# Patient Record
Sex: Male | Born: 1950 | Race: Black or African American | Hispanic: No | Smoking: Former smoker
Health system: Southern US, Community
[De-identification: ages and names within clinical notes are randomized; demographics above are authoritative.]

## PROBLEM LIST (undated history)

## (undated) DIAGNOSIS — D649 Anemia, unspecified: Secondary | ICD-10-CM

## (undated) HISTORY — PX: COLONOSCOPY: SHX174

---
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
## 2021-07-23 ENCOUNTER — Other Ambulatory Visit: Payer: Self-pay

## 2021-07-23 ENCOUNTER — Emergency Department (HOSPITAL_COMMUNITY): Payer: Medicare Other

## 2021-07-23 ENCOUNTER — Emergency Department (HOSPITAL_COMMUNITY)
Admission: EM | Admit: 2021-07-23 | Discharge: 2021-07-23 | Disposition: A | Discharge status: Home / Self Care | Payer: Medicare Other | Attending: Emergency Medicine | Admitting: Emergency Medicine

## 2021-07-23 ENCOUNTER — Encounter (HOSPITAL_COMMUNITY): Payer: Self-pay

## 2021-07-23 DIAGNOSIS — R195 Other fecal abnormalities: Secondary | ICD-10-CM | POA: Diagnosis present

## 2021-07-23 DIAGNOSIS — F17210 Nicotine dependence, cigarettes, uncomplicated: Secondary | ICD-10-CM | POA: Diagnosis not present

## 2021-07-23 DIAGNOSIS — R109 Unspecified abdominal pain: Secondary | ICD-10-CM | POA: Diagnosis not present

## 2021-07-23 DIAGNOSIS — K625 Hemorrhage of anus and rectum: Secondary | ICD-10-CM | POA: Insufficient documentation

## 2021-07-23 LAB — COMPREHENSIVE METABOLIC PANEL
ALT: 24 U/L (ref 0–44)
AST: 25 U/L (ref 15–41)
Albumin: 3.6 g/dL (ref 3.5–5.0)
Alkaline Phosphatase: 91 U/L (ref 38–126)
Anion gap: 8 (ref 5–15)
BUN: 15 mg/dL (ref 8–23)
CO2: 27 mmol/L (ref 22–32)
Calcium: 9.2 mg/dL (ref 8.9–10.3)
Chloride: 102 mmol/L (ref 98–111)
Creatinine, Ser: 0.74 mg/dL (ref 0.61–1.24)
GFR, Estimated: >60 mL/min (ref >60)
Glucose, Bld: 102 mg/dL — ABNORMAL HIGH (ref 70–99)
Potassium: 4.1 mmol/L (ref 3.5–5.1)
Sodium: 137 mmol/L (ref 135–145)
Total Bilirubin: 0.4 mg/dL (ref 0.3–1.2)
Total Protein: 8.1 g/dL (ref 6.5–8.1)

## 2021-07-23 LAB — CBC WITH DIFFERENTIAL/PLATELET
Abs Immature Granulocytes: 0.03 10*3/uL (ref 0.00–0.07)
Basophils Absolute: 0.1 10*3/uL (ref 0.0–0.1)
Basophils Relative: 1 %
Eosinophils Absolute: 0.2 10*3/uL (ref 0.0–0.5)
Eosinophils Relative: 2 %
HCT: 42.6 % (ref 39.0–52.0)
Hemoglobin: 13.4 g/dL (ref 13.0–17.0)
Immature Granulocytes: 0 %
Lymphocytes Relative: 19 %
Lymphs Abs: 1.6 10*3/uL (ref 0.7–4.0)
MCH: 28 pg (ref 26.0–34.0)
MCHC: 31.5 g/dL (ref 30.0–36.0)
MCV: 89.1 fL (ref 80.0–100.0)
Monocytes Absolute: 0.8 10*3/uL (ref 0.1–1.0)
Monocytes Relative: 10 %
Neutro Abs: 6 10*3/uL (ref 1.7–7.7)
Neutrophils Relative %: 68 %
Platelets: 450 10*3/uL — ABNORMAL HIGH (ref 150–400)
RBC: 4.78 MIL/uL (ref 4.22–5.81)
RDW: 14 % (ref 11.5–15.5)
WBC: 8.7 10*3/uL (ref 4.0–10.5)
nRBC: 0 % (ref 0.0–0.2)

## 2021-07-23 LAB — URINALYSIS, ROUTINE W REFLEX MICROSCOPIC
Bacteria, UA: NONE SEEN
Bilirubin Urine: NEGATIVE
Glucose, UA: NEGATIVE mg/dL
Ketones, ur: NEGATIVE mg/dL
Leukocytes,Ua: NEGATIVE
Nitrite: NEGATIVE
Protein, ur: NEGATIVE mg/dL
Specific Gravity, Urine: >1.03 — ABNORMAL HIGH (ref 1.005–1.030)
pH: 6 (ref 5.0–8.0)

## 2021-07-23 LAB — LIPASE, BLOOD: Lipase: 26 U/L (ref 11–51)

## 2021-07-23 IMAGING — CT CT ABD-PELV W/ CM
2 of 5 series · 16 of 46 positions shown, 18 images · IV contrast (OMNIPAQUE 350)
Comparison: None.

CLINICAL DATA: Left lower quadrant abdominal pain for 3 months.
Dark red blood in stool.

EXAM:
CT ABDOMEN AND PELVIS WITH CONTRAST
TECHNIQUE: Multidetector CT imaging of the abdomen and pelvis was performed
using the standard protocol following bolus administration of
intravenous contrast.
CONTRAST:  80mL OMNIPAQUE IOHEXOL 350 MG/ML SOLN

[Series 2: axial st · axial · 0.75mm/px · z∈[+1196,+1531]mm · 13 of 79 slices shown, 15 images]
[im 6/79  soft-tissue]
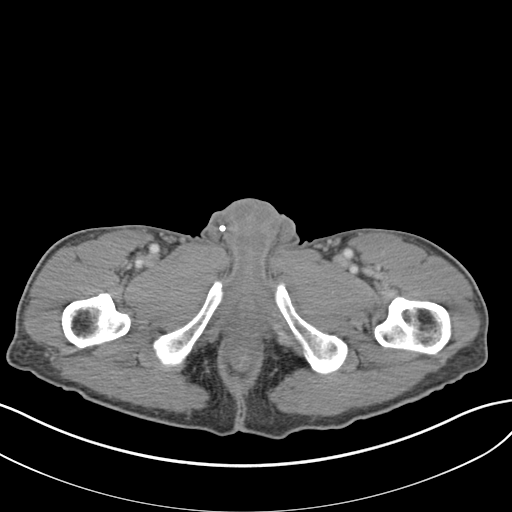
[im 6/79  bone]
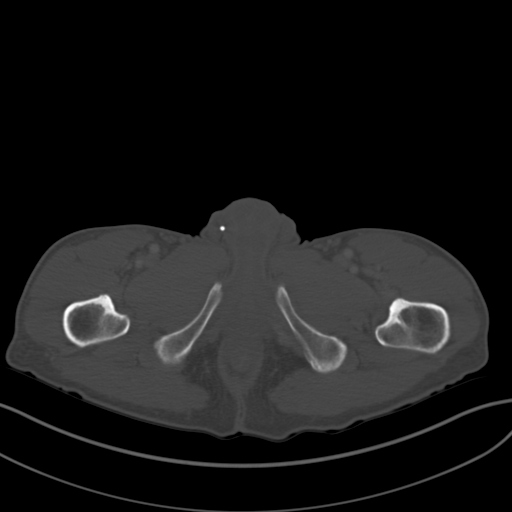
[im 12/79  soft-tissue]
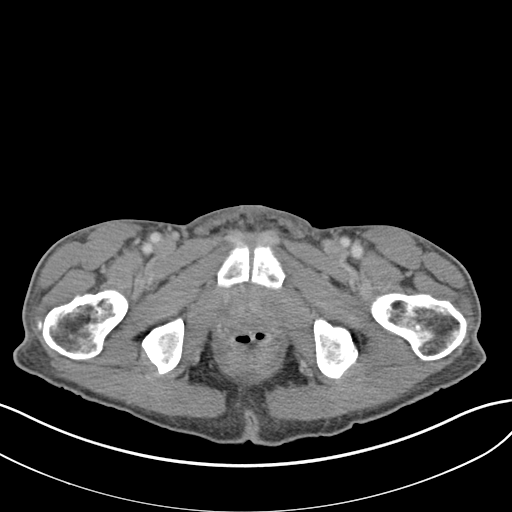
[im 17/79  soft-tissue]
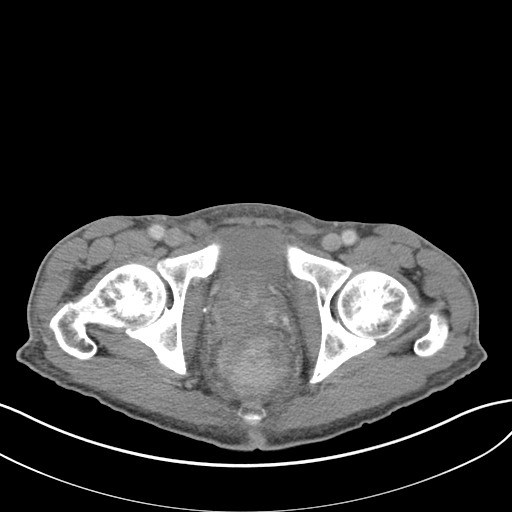
[im 23/79  soft-tissue]
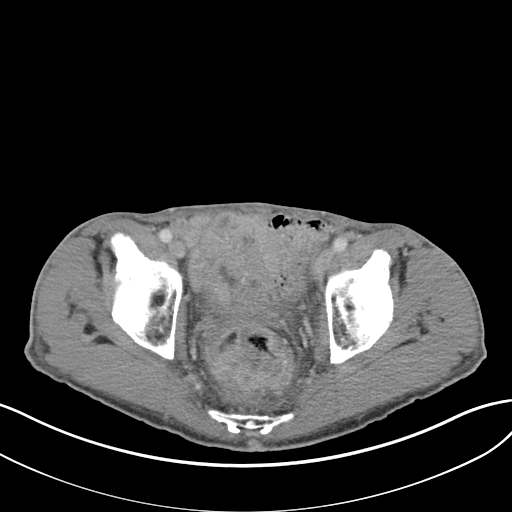
[im 28/79  soft-tissue]
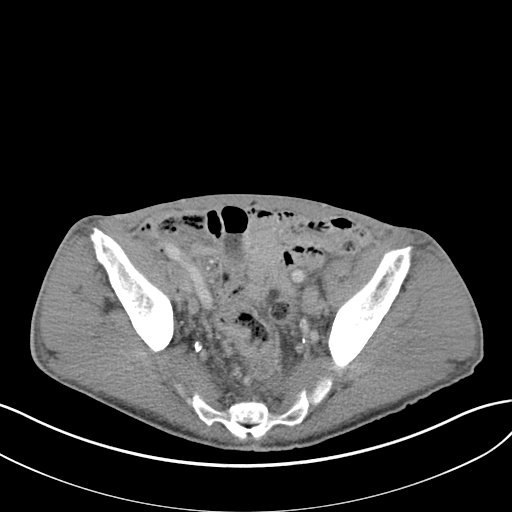
[im 34/79  soft-tissue]
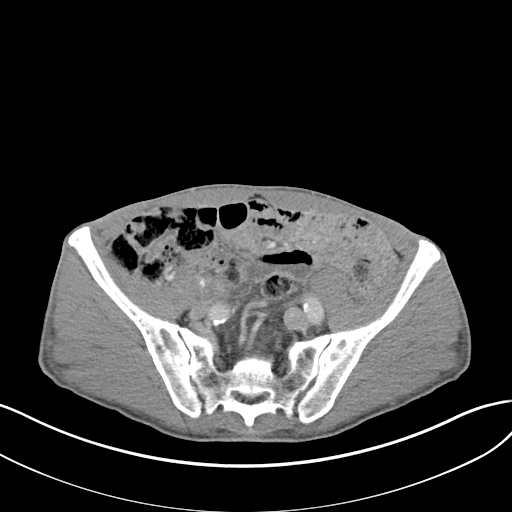
[im 40/79  soft-tissue]
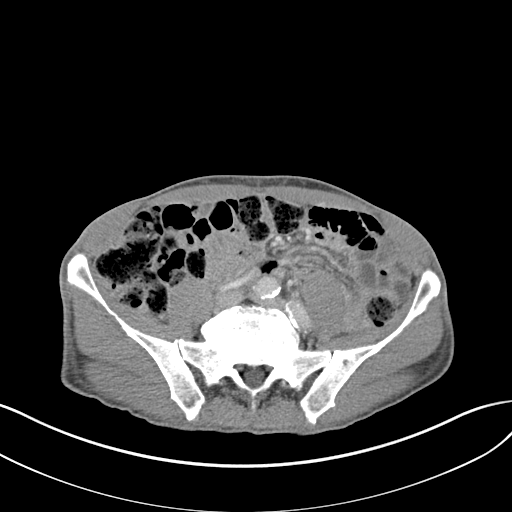
[im 45/79  soft-tissue]
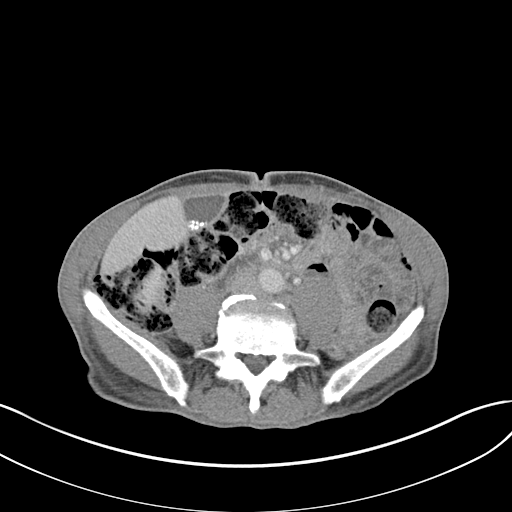
[im 51/79  soft-tissue]
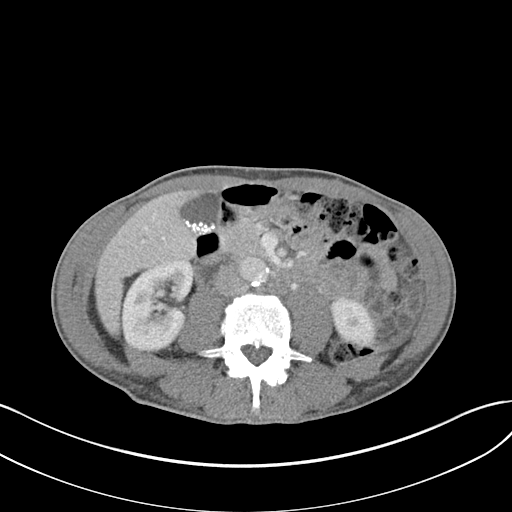
[im 51/79  bone]
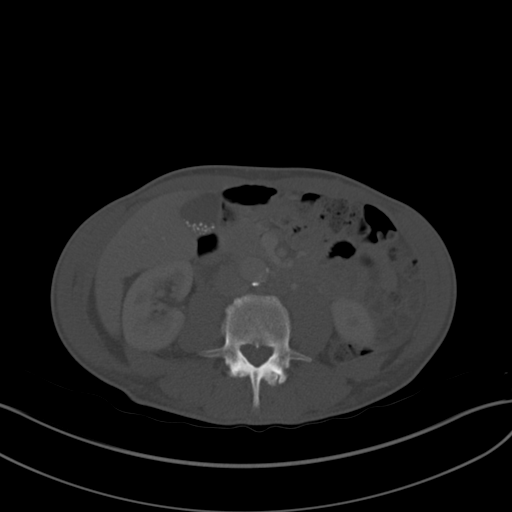
[im 56/79  soft-tissue]
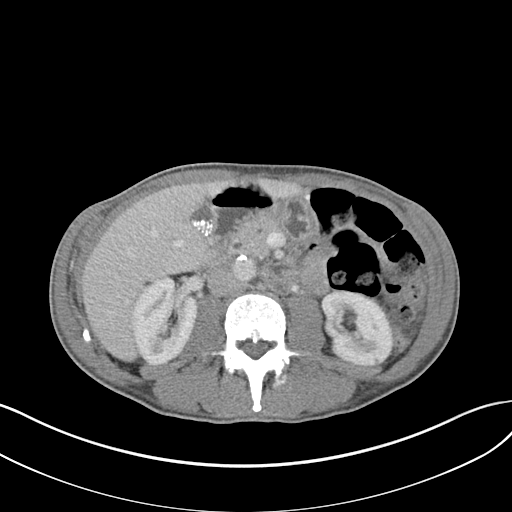
[im 62/79  soft-tissue]
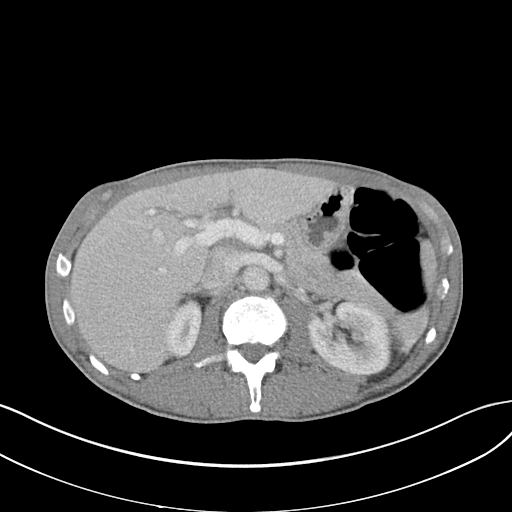
[im 67/79  soft-tissue]
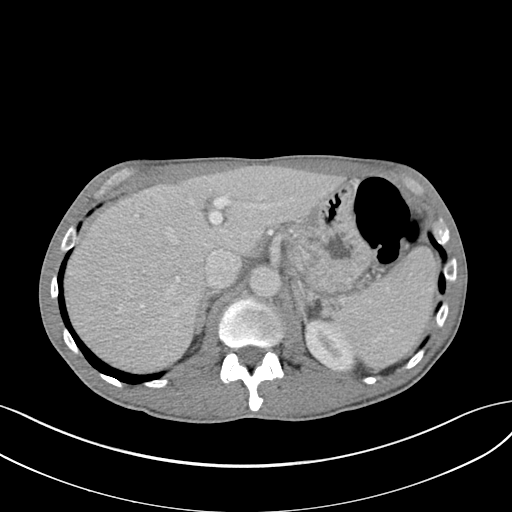
[im 73/79  soft-tissue]
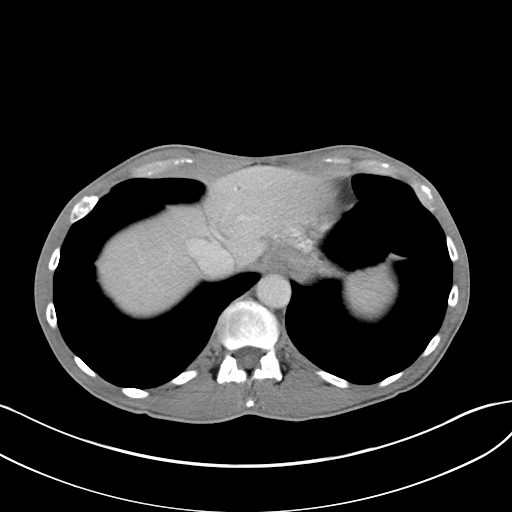

[Series 5: coronal st · coronal · 0.74mm/px · 3 of 121 slices shown]
[im 41/121  soft-tissue]
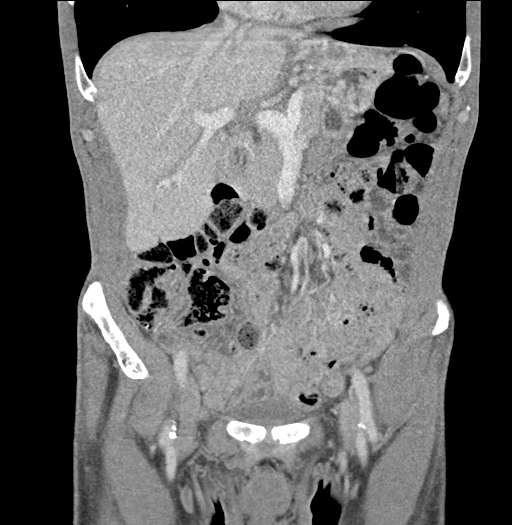
[im 54/121  soft-tissue]
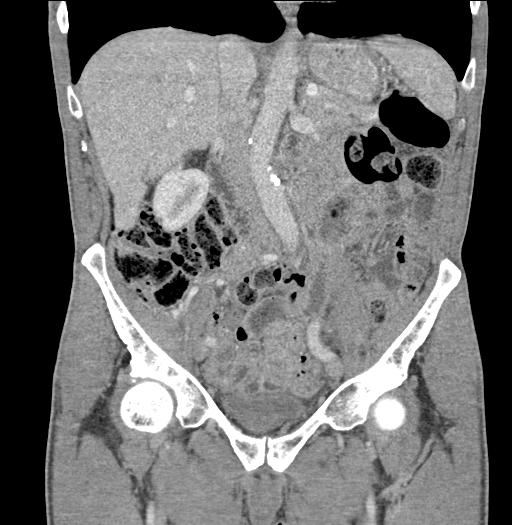
[im 67/121  soft-tissue]
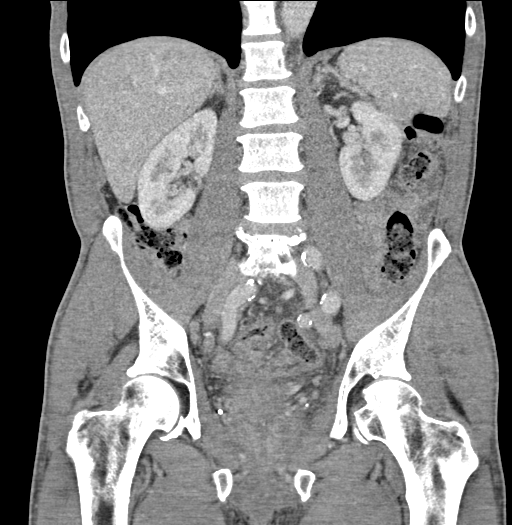

[16 of 46 positions shown; findings below may reference images not displayed]

FINDINGS: Lower chest: Lung bases are clear.

Hepatobiliary: No focal liver lesions. Cholelithiasis with multiple
small stones in the gallbladder. No inflammatory changes. No bile
duct dilatation.

Pancreas: Unremarkable. No pancreatic ductal dilatation or
surrounding inflammatory changes.

Spleen: Normal in size without focal abnormality.

Adrenals/Urinary Tract: Adrenal glands are unremarkable. Kidneys are
normal, without renal calculi, focal lesion, or hydronephrosis.
Bladder is unremarkable.

Stomach/Bowel: The stomach, small bowel, and colon are not
abnormally distended. Stool fills the colon. Lack of fat planes and
in the absence of oral contrast material, evaluation of bowel is
limited. However, the rectum appears to be expanded and
heterogeneous, more than would be expected with stool. Given the
symptoms, this is suspicious for a circumferential rectal mass,
likely neoplastic. There is infiltration into the perirectal fat
with suggestion of a small perirectal hypodense mass or collection.
Further evaluation of the colon is warranted. Consider colonoscopy
or proctoscopy for direct visualization of the area. Inflammatory
process would be less likely.

Vascular/Lymphatic: Scattered aortic calcification. No aneurysm. No
significant retroperitoneal lymphadenopathy.

Reproductive: Prostate gland is enlarged.

Other: No free air or free fluid in the abdomen. Abdominal wall
musculature appears intact.

Musculoskeletal: No acute or significant osseous findings.
IMPRESSION: 1. Appearance of the rectum likely indicates a rectal mass
suspicious for rectal carcinoma. Inflammatory process would be less
likely. Direct visualization is suggested.
2. Cholelithiasis without evidence of acute cholecystitis.
3. Aortic atherosclerosis.

## 2021-07-23 MED ORDER — IOHEXOL 350 MG/ML SOLN
80.0000 mL | Freq: Once | INTRAVENOUS | Status: AC | PRN
  Administered 2021-07-23: 80 mL via INTRAVENOUS

## 2021-07-23 NOTE — Progress Notes (Signed)
IV placed for patient's CT scan, explained to patient not to leave without advising the nurse staff, also explained to the patient's family

## 2021-07-23 NOTE — ED Provider Notes (Signed)
[facility] DEPT Provider Note   CSN: [id number] Arrival date & time: 07/23/21  1609     History Chief Complaint  Patient presents with   Blood In Stools   Abdominal Pain   Diarrhea    Jorge [name] is a 70 y.o. male.  Patient presents to ER chief complaint of bloody stools intermittent diarrhea.  Symptoms have been ongoing for approximately 5 weeks intermittently.  He went to urgent care today and was advised to go to the ER for further evaluation.  He denies any abdominal pain.  Denies any headache or chest pain.  No reports of fevers or cough.  He states he has intermittent back pain at times has been taking Motrin for several weeks now.      History reviewed. No pertinent past medical history.  There are no problems to display for this patient.   History reviewed. No pertinent surgical history.     History reviewed. No pertinent family history.  Social History   Tobacco Use   Smoking status: Every Day    Types: Cigarettes   Smokeless tobacco: Never  Substance Use Topics   Alcohol use: Not Currently   Drug use: Never    Home Medications Prior to Admission medications   Not on File    Allergies    Patient has no known allergies.  Review of Systems   Review of Systems  Constitutional:  Negative for fever.  HENT:  Negative for ear pain and sore throat.   Eyes:  Negative for pain.  Respiratory:  Negative for cough.   Cardiovascular:  Negative for chest pain.  Gastrointestinal:  Negative for abdominal pain.  Genitourinary:  Negative for flank pain.  Musculoskeletal:  Positive for gait problem. Negative for back pain.  Skin:  Negative for color change and rash.  Neurological:  Negative for syncope.  All other systems reviewed and are negative.  Physical Exam Updated Vital Signs BP (!) 157/83   Pulse 81   Temp 98.1 F (36.7 C) (Oral)   Resp 18   SpO2 99%   Physical Exam Constitutional:      Appearance: He is  well-developed.  HENT:     Head: Normocephalic.     Nose: Nose normal.  Eyes:     Extraocular Movements: Extraocular movements intact.  Cardiovascular:     Rate and Rhythm: Normal rate.  Pulmonary:     Effort: Pulmonary effort is normal.  Abdominal:     Tenderness: There is no abdominal tenderness. There is no guarding or rebound.  Skin:    Coloration: Skin is not jaundiced.  Neurological:     General: No focal deficit present.     Mental Status: He is alert and oriented to person, place, and time. Mental status is at baseline.     Cranial Nerves: No cranial nerve deficit.     Motor: No weakness.    ED Results / Procedures / Treatments   Labs (all labs ordered are listed, but only abnormal results are displayed) Labs Reviewed  CBC WITH DIFFERENTIAL/PLATELET - Abnormal; Notable for the following components:      Result Value   Platelets 450 (*)    All other components within normal limits  COMPREHENSIVE METABOLIC PANEL - Abnormal; Notable for the following components:   Glucose, Bld 102 (*)    All other components within normal limits  URINALYSIS, ROUTINE W REFLEX MICROSCOPIC - Abnormal; Notable for the following components:   Color, Urine YELLOW (*)  APPearance CLEAR (*)    Specific Gravity, Urine >1.030 (*)    Hgb urine dipstick TRACE (*)    Non Squamous Epithelial 0-5 (*)    All other components within normal limits  LIPASE, BLOOD  POC OCCULT BLOOD, ED    EKG None  Radiology CT ABDOMEN PELVIS W CONTRAST  Result Date: 07/23/2021 CLINICAL DATA:  Left lower quadrant abdominal pain for 3 months. Dark red blood in stool. EXAM: CT ABDOMEN AND PELVIS WITH CONTRAST TECHNIQUE: Multidetector CT imaging of the abdomen and pelvis was performed using the standard protocol following bolus administration of intravenous contrast. CONTRAST:  40m OMNIPAQUE IOHEXOL 350 MG/ML SOLN COMPARISON:  None. FINDINGS: Lower chest: Lung bases are clear. Hepatobiliary: No focal liver lesions.  Cholelithiasis with multiple small stones in the gallbladder. No inflammatory changes. No bile duct dilatation. Pancreas: Unremarkable. No pancreatic ductal dilatation or surrounding inflammatory changes. Spleen: Normal in size without focal abnormality. Adrenals/Urinary Tract: Adrenal glands are unremarkable. Kidneys are normal, without renal calculi, focal lesion, or hydronephrosis. Bladder is unremarkable. Stomach/Bowel: The stomach, small bowel, and colon are not abnormally distended. Stool fills the colon. Lack of fat planes and in the absence of oral contrast material, evaluation of bowel is limited. However, the rectum appears to be expanded and heterogeneous, more than would be expected with stool. Given the symptoms, this is suspicious for a circumferential rectal mass, likely neoplastic. There is infiltration into the perirectal fat with suggestion of a small perirectal hypodense mass or collection. Further evaluation of the colon is warranted. Consider colonoscopy or proctoscopy for direct visualization of the area. Inflammatory process would be less likely. Vascular/Lymphatic: Scattered aortic calcification. No aneurysm. No significant retroperitoneal lymphadenopathy. Reproductive: Prostate gland is enlarged. Other: No free air or free fluid in the abdomen. Abdominal wall musculature appears intact. Musculoskeletal: No acute or significant osseous findings. IMPRESSION: 1. Appearance of the rectum likely indicates a rectal mass suspicious for rectal carcinoma. Inflammatory process would be less likely. Direct visualization is suggested. 2. Cholelithiasis without evidence of acute cholecystitis. 3. Aortic atherosclerosis. Electronically Signed   By: WLucienne CapersM.D.   On: 07/23/2021 19:06    Procedures Procedures   Medications Ordered in ED Medications  iohexol (OMNIPAQUE) 350 MG/ML injection 80 mL (80 mLs Intravenous Contrast Given 07/23/21 1830)    ED Course  I have reviewed the triage  vital signs and the nursing notes.  Pertinent labs & imaging results that were available during my care of the patient were reviewed by me and considered in my medical decision making (see chart for details).    MDM Rules/Calculators/A&P                           Patient has a benign exam.  He has no abdominal tenderness no guarding or rebound.  Vital signs are within normal limits.  Hemoglobin appears normal and stable.  His symptoms appear ongoing for several weeks without significant lab abnormalities noted or vital sign abnormalities noted.  I feel he stable for outpatient work-up.  We will provide a phone number for gastroenterologist for patient to call make an appointment.  However advised immediate return if he has weakness trouble breathing shortness of breath fatigue or any additional concerns such as heavy bleeding.  Advised him to call the GI doctor otherwise this week.  Final Clinical Impression(s) / ED Diagnoses Final diagnoses:  Rectal bleeding    Rx / DC Orders ED Discharge Orders  None        Luna Fuse, MD 07/23/21 2102

## 2021-07-23 NOTE — ED Triage Notes (Signed)
Pt reports abdominal pain, dark red blood in his stool and diarrhea x 9 months. Pt was seen at [facility] and was told to come to the ED

## 2021-07-23 NOTE — ED Provider Notes (Signed)
Emergency Medicine Provider Triage Evaluation Note  Jorge [name] , a 70 y.o. male  was evaluated in triage.  Pt complains of blood in stool.  Review of Systems  Positive: Increase BMs, dark color stool, lower back pain, mild abd pain Negative: Fever, dysuria  Physical Exam  BP (!) 174/96 (BP Location: Left Arm)   Pulse 82   Temp 98.1 F (36.7 C) (Oral)   Resp 16   SpO2 100%  Gen:   Awake, no distress   Resp:  Normal effort  MSK:   Moves extremities without difficulty  Other:    Medical Decision Making  Medically screening exam initiated at 5:00 PM.  Appropriate orders placed.  Jorge [name] was informed that the remainder of the evaluation will be completed by another provider, this initial triage assessment does not replace that evaluation, and the importance of remaining in the ED until their evaluation is complete.  Pt with dark red blood in stool along with having loose stool for more than a month.  Does have back pain that he takes ibuprofen regularly.  Increase BM.  No n/v   Domenic Moras, PA-C 07/23/21 1702    Luna Fuse, MD 07/23/21 2059

## 2021-07-23 NOTE — Discharge Instructions (Addendum)
Call your primary care doctor or specialist as discussed in the next 2-3 days.   Return immediately back to the ER if:  Your symptoms worsen within the next 12-24 hours. You develop new symptoms such as new fevers, persistent vomiting, new pain, shortness of breath, or new weakness or numbness, or if you have any other concerns.  

## 2021-08-09 ENCOUNTER — Encounter: Payer: Self-pay | Admitting: Gastroenterology

## 2021-08-11 ENCOUNTER — Emergency Department (HOSPITAL_COMMUNITY)
Admission: EM | Admit: 2021-08-11 | Discharge: 2021-08-12 | Disposition: A | Discharge status: Home / Self Care | Payer: Medicare Other | Attending: Emergency Medicine | Admitting: Emergency Medicine

## 2021-08-11 ENCOUNTER — Other Ambulatory Visit: Payer: Self-pay

## 2021-08-11 ENCOUNTER — Encounter (HOSPITAL_COMMUNITY): Payer: Self-pay | Admitting: Emergency Medicine

## 2021-08-11 DIAGNOSIS — F17210 Nicotine dependence, cigarettes, uncomplicated: Secondary | ICD-10-CM | POA: Insufficient documentation

## 2021-08-11 DIAGNOSIS — K6289 Other specified diseases of anus and rectum: Secondary | ICD-10-CM | POA: Diagnosis not present

## 2021-08-11 DIAGNOSIS — K625 Hemorrhage of anus and rectum: Secondary | ICD-10-CM | POA: Diagnosis present

## 2021-08-11 LAB — CBC WITH DIFFERENTIAL/PLATELET
Abs Immature Granulocytes: 0.01 10*3/uL (ref 0.00–0.07)
Basophils Absolute: 0.1 10*3/uL (ref 0.0–0.1)
Basophils Relative: 1 %
Eosinophils Absolute: 0.2 10*3/uL (ref 0.0–0.5)
Eosinophils Relative: 3 %
HCT: 38 % — ABNORMAL LOW (ref 39.0–52.0)
Hemoglobin: 12.3 g/dL — ABNORMAL LOW (ref 13.0–17.0)
Immature Granulocytes: 0 %
Lymphocytes Relative: 29 %
Lymphs Abs: 2.2 10*3/uL (ref 0.7–4.0)
MCH: 28.6 pg (ref 26.0–34.0)
MCHC: 32.4 g/dL (ref 30.0–36.0)
MCV: 88.4 fL (ref 80.0–100.0)
Monocytes Absolute: 0.8 10*3/uL (ref 0.1–1.0)
Monocytes Relative: 11 %
Neutro Abs: 4.4 10*3/uL (ref 1.7–7.7)
Neutrophils Relative %: 56 %
Platelets: 397 10*3/uL (ref 150–400)
RBC: 4.3 MIL/uL (ref 4.22–5.81)
RDW: 14.6 % (ref 11.5–15.5)
WBC: 7.7 10*3/uL (ref 4.0–10.5)
nRBC: 0 % (ref 0.0–0.2)

## 2021-08-11 LAB — COMPREHENSIVE METABOLIC PANEL
ALT: 14 U/L (ref 0–44)
AST: 18 U/L (ref 15–41)
Albumin: 3.3 g/dL — ABNORMAL LOW (ref 3.5–5.0)
Alkaline Phosphatase: 88 U/L (ref 38–126)
Anion gap: 6 (ref 5–15)
BUN: 13 mg/dL (ref 8–23)
CO2: 25 mmol/L (ref 22–32)
Calcium: 9 mg/dL (ref 8.9–10.3)
Chloride: 107 mmol/L (ref 98–111)
Creatinine, Ser: 0.71 mg/dL (ref 0.61–1.24)
GFR, Estimated: >60 mL/min (ref >60)
Glucose, Bld: 98 mg/dL (ref 70–99)
Potassium: 3.9 mmol/L (ref 3.5–5.1)
Sodium: 138 mmol/L (ref 135–145)
Total Bilirubin: 0.7 mg/dL (ref 0.3–1.2)
Total Protein: 7.9 g/dL (ref 6.5–8.1)

## 2021-08-11 LAB — TYPE AND SCREEN
ABO/RH(D): O NEG
Antibody Screen: NEGATIVE

## 2021-08-11 LAB — PROTIME-INR
INR: 1.1 (ref 0.8–1.2)
Prothrombin Time: 14.5 seconds (ref 11.4–15.2)

## 2021-08-11 LAB — POC OCCULT BLOOD, ED: Fecal Occult Bld: POSITIVE — AB

## 2021-08-11 NOTE — ED Notes (Signed)
Per EMT first pt did not respond to role call

## 2021-08-11 NOTE — ED Triage Notes (Signed)
Pt c/o bloody stools and diarrhea x 2 weeks. Denies abdominal pain and fevers.

## 2021-08-11 NOTE — ED Provider Notes (Signed)
Emergency Medicine Provider Triage Evaluation Note  Jorge [name] , a 70 y.o. male  was evaluated in triage.  Pt complains of rectal bleeding.  Patient states his symptoms have been ongoing for the past 4 to 5 months.  No abdominal pain, fevers, syncope.  Does note intermittent lightheadedness, particularly when standing up too fast.  No rectal pain.  States his rectal bleeding occurs on a near daily basis.  States he used to take ibuprofen frequently but has discontinued ibuprofen use.  Denies any alcohol use.  Physical Exam  BP (!) 182/104 (BP Location: Left Arm)   Pulse 99   Temp 98 F (36.7 C) (Oral)   Resp 17   Ht 6' (1.829 m)   Wt 61.2 kg   SpO2 99%   BMI 18.31 kg/m  Gen:   Awake, no distress   Resp:  Normal effort  MSK:   Moves extremities without difficulty  Other:    Medical Decision Making  Medically screening exam initiated at 3:38 PM.  Appropriate orders placed.  Jorge [name] was informed that the remainder of the evaluation will be completed by another provider, this initial triage assessment does not replace that evaluation, and the importance of remaining in the ED until their evaluation is complete.   Rayna Sexton, PA-C 08/11/21 1539    Lacretia Leigh, MD 08/15/21 [phone]

## 2021-08-12 MED ORDER — HYDROCORTISONE (PERIANAL) 2.5 % EX CREA: 1.0000 "application " | TOPICAL_CREAM | Freq: Two times a day (BID) | CUTANEOUS | 0 refills | Status: DC

## 2021-08-12 NOTE — ED Provider Notes (Signed)
[place] DEPT Provider Note   CSN: [id number] Arrival date & time: 08/11/21  1524     History Chief Complaint  Patient presents with   Rectal Bleeding    Jorge [name] is a 70 y.o. male.  HPI Patient reports he has been having problems with rectal bleeding for several months.  He did get seen and had a CAT scan done in the emergency department [phone].  At that time concern for a rectal mass.  Patient reports he has his follow-up with gastroenterology scheduled but appointment is still a week away.  Patient reports that he is getting large amounts of rectal bleeding.  He reports some days he does not have any bleeding but then other days he has quite a bit.  He reports that he has rectal pain when he is sitting directly on the rectum.  He however is not having rectal pain when he is on the toilet or does not pressure directly to the rectum.  Reports he is also passing a lot of gas that he cannot seem to control.  No abdominal pain.  No lightheadedness no weakness no syncope or near syncope.  He reports the stool is usually semisolid and muddy in appearance.    History reviewed. No pertinent past medical history.  There are no problems to display for this patient.   History reviewed. No pertinent surgical history.     No family history on file.  Social History   Tobacco Use   Smoking status: Every Day    Types: Cigarettes   Smokeless tobacco: Never  Substance Use Topics   Alcohol use: Not Currently   Drug use: Never    Home Medications Prior to Admission medications   Medication Sig Start Date End Date Taking? Authorizing Provider  hydrocortisone (ANUSOL-HC) 2.5 % rectal cream Place 1 application rectally 2 (two) times daily. 08/12/21  Yes Charlesetta Shanks, MD    Allergies    Patient has no known allergies.  Review of Systems   Review of Systems 10 systems reviewed and negative except as per HPI Physical Exam Updated Vital Signs BP  (!) 148/91   Pulse 85   Temp 98 F (36.7 C) (Oral)   Resp 18   Ht 6' (1.829 m)   Wt 61.2 kg   SpO2 98%   BMI 18.31 kg/m   Physical Exam Constitutional:      Comments: Alert nontoxic clinically well in appearance.  HENT:     Head: Normocephalic and atraumatic.     Mouth/Throat:     Pharynx: Oropharynx is clear.  Eyes:     Extraocular Movements: Extraocular movements intact.  Cardiovascular:     Rate and Rhythm: Normal rate and regular rhythm.  Pulmonary:     Effort: Pulmonary effort is normal.     Breath sounds: Normal breath sounds.  Abdominal:     General: There is no distension.     Palpations: Abdomen is soft.     Tenderness: There is no abdominal tenderness. There is no guarding.  Genitourinary:    Comments: Normal external visual inspection of rectum.  No masses or hemorrhoids.  Digital exam is for diffuse tenderness of the anal canal.  No local mass.  Trace yellowish-green stool from the vault.  No melena, no cranberry colored or red blood. Musculoskeletal:        General: No swelling or tenderness. Normal range of motion.     Right lower leg: No edema.  Left lower leg: No edema.  Skin:    General: Skin is warm and dry.  Neurological:     General: No focal deficit present.     Mental Status: He is oriented to person, place, and time.     Motor: No weakness.     Coordination: Coordination normal.    ED Results / Procedures / Treatments   Labs (all labs ordered are listed, but only abnormal results are displayed) Labs Reviewed  CBC WITH DIFFERENTIAL/PLATELET - Abnormal; Notable for the following components:      Result Value   Hemoglobin 12.3 (*)    HCT 38.0 (*)    All other components within normal limits  COMPREHENSIVE METABOLIC PANEL - Abnormal; Notable for the following components:   Albumin 3.3 (*)    All other components within normal limits  POC OCCULT BLOOD, ED - Abnormal; Notable for the following components:   Fecal Occult Bld POSITIVE (*)     All other components within normal limits  PROTIME-INR  TYPE AND SCREEN  ABO/RH    EKG None  Radiology No results found.  Procedures Procedures   Medications Ordered in ED Medications - No data to display  ED Course  I have reviewed the triage vital signs and the nursing notes.  Pertinent labs & imaging results that were available during my care of the patient were reviewed by me and considered in my medical decision making (see chart for details).    MDM Rules/Calculators/A&P                           Patient presents for rectal bleeding.  CT scan done about 3 weeks ago showed circumferential thickening concerning for rectal mass neoplasm.  Patient is scheduled for his follow-up with gastroenterology in a week.  He is having sporadic bouts of rectal bleeding.  Hemoglobin is not showing significant change at this time.  Is greater than 12.  About 3 weeks ago was at 37.  Patient does not show any signs of being hypovolemic orthostatic.  Digital rectal exam does not show any melena or cramping or red stool.  Normal trace amount of yellowish-green stool.  At this time patient is counseled on maintaining his plan for close follow-up with GI and the concern for rectal neoplasm.  Reviewed return symptoms for anemia.  For some local symptomatic relief patient can try Anusol HC and sitz bath.  Careful return precautions reviewed. Final Clinical Impression(s) / ED Diagnoses Final diagnoses:  Rectal bleeding  Rectal mass    Rx / DC Orders ED Discharge Orders          Ordered    hydrocortisone (ANUSOL-HC) 2.5 % rectal cream  2 times daily        08/12/21 0009             Charlesetta Shanks, MD 08/12/21 7591

## 2021-08-12 NOTE — Discharge Instructions (Signed)
1.  CT scan identified a rectal mass.  You must go to your appointment with the gastroenterologist as scheduled. 2.  Until you are seen and further evaluate, it may be difficult to completely control or eliminate your symptoms.  At this time you are having some rectal bleeding but fortunately, your blood counts and vital signs are remaining stable.  If you start to develop symptoms of anemia such as shortness of breath with activity, feeling like you will pass out or other concerning symptoms, return to the emergency department for recheck. 3.  For some local symptomatic relief you may try Anusol as prescribed.  May also try sitz bath as described in your discharge instructions.

## 2021-08-18 ENCOUNTER — Telehealth: Payer: Self-pay | Admitting: Gastroenterology

## 2021-08-18 ENCOUNTER — Other Ambulatory Visit: Payer: Self-pay

## 2021-08-18 ENCOUNTER — Encounter: Payer: Self-pay | Admitting: *Deleted

## 2021-08-18 DIAGNOSIS — K625 Hemorrhage of anus and rectum: Secondary | ICD-10-CM

## 2021-08-18 NOTE — Telephone Encounter (Signed)
I spoke with the daughter and she has been advised and instructed for colon tomorrow 08/19/21 at 8 am. She has picked up the miralax and dulcolax.  I have entered the referral and sent the instructions to My Chart.  The daughter signed the pt up for my Chart while we were on the phone.

## 2021-08-18 NOTE — Telephone Encounter (Signed)
Jorge [name] 10-20-1951 [id number]  Procedure Date: 08/19/21  Arrival Time: 622 am Procedure Time:8 am  Procedure Location: [facility] (4th Floor)                                     [place], [place] [address]  [facility] open at 645 am   Check in at the receptionist's desk on the 4th floor before having a seat in the main lobby.                                                                               MIRALAX SPLIT DOSE INSTRUCTIONS THIS IS A SPLIT-DOSE PREP YOU WILL NOT BE DRINKING ALL THE PREP AT SAME TIME.   FOLLOW THESE INSTRUCTIONS ON THE DAY PRIOR TO AND THE MORNING OF THE EXAM.  Please purchase the following items over the counter: One box Dulcolax laxative (Bisacodyl) 5 mg tablets (you will need 4 tablets) One 238 gram bottle of Miralax (polyethylene glycol)  One 64 ounce bottle of Gatorade (NO Red, NO purple)    THE DAY BEFORE YOUR PROCEDURE Date: 08/18/21  TODAY   In the morning, mix the entire 238 gram bottle of Miralax with your 64 ounce room temperature Gatorade. Stir to dissolve and refrigerate.   Drink clear liquids the entire day - YOU SHOULD NOT EAT ANY SOLID FOOD.    Do not drink anything colored red or purple. Avoid juices with pulp. No orange juice.   Drink at least 64 oz. (8 glasses) of fluid/clear liquids during the day to prevent dehydration and help the prep work best.  Drink one 8-10 oz glass of any clear liquid once an hour.  Clear liquids include:NO RED NO PURPLE   Water Jello  Ice Popsicles  Tea (sugar ok, no milk/cream) Powdered fruit flavored drinks  Coffee (sugar ok, no milk/cream) Gatorade  Juice: apple, white grape, white cranberry Lemonade  Clear bullion, consomme, broth Carbonated beverages (any kind)  Strained chicken noodle soup  (no noodles or chicken) Hard Candy    At 3:00 pm take 4 Dulcolax (Bisacodyl) tablets with water.  At 5:00 pm  complete steps A , B and C below:  A. Start drinking the Miralax solution, until 1/2 the liquid is gone.         You will drink one 8 ounce glass every 15 minutes until 1/2 the solution is finished. 32 ounces (four 8 oz glasses)  B. Return remainder of the solution to the refrigerator and maintain a clear liquid diet.   C. Drink at least 3 additional glasses of clear liquid before bedtime.     ONLY DRINK HALF OF THE PREP THE NIGHT BEFORE PROCEDURE.   THE SECOND HALF MUST BE CONSUMED 5 HOURS BEFORE THE PROCEDURE ON THE DAY OF THE COLONOSCOPY.  _____________________________________________________________________  THE DAY OF YOUR PROCEDURE  Date 08/19/21  TOMORROW  STAY ON CLEAR LIQUIDS, NO SOLID FOOD  A. Start drinking the second half of the Miralax prep at  3 am (5 hours prior to the start of your procedure).   B. Drink one 8 oz glass every 15 minutes until all the solution is gone. 32 ounces (four 8 oz glasses)   C. After you finish the prep solution, wait another 15 minutes and then drink an additional 16 oz clear liquid of your choice (nothing red or purple).   You may drink clear liquids until 5 am (which is 3 hours prior to start of your procedure).  Stop drinking ALL LIQUIDS  including water, no gum, candy, or medicines 3 hours before the procedure   ______________________________________________________________________   OTHER PROCEDURE INSTRUCTIONS  MEDICATION INSTRUCTIONS  Unless otherwise instructed, you should take regular prescription medications with a small sip of water as early as possible the morning of your procedure.  Take allowed medicines by 5 am   Additional medication instructions: If you use any type of inhaler please bring them with you to your procedure.    Call office [phone]) if you have fever 2 days before procedure  CARE PARTNER   You will need a responsible adult at least 70 years of age to act as your care partner on the day of your  procedure. This person needs to arrive with you to the facility, stay here during your procedure and then drive you home afterwards.  We cannot start your procedure unless your care partner is present in our facility. The total time from sign in until discharge is approximately 2-3 hours.  Before you leave, your doctor will review the findings and recommendations with you (and your care partner, if you give permission). Due to the COVID-19 pandemic we are asking patients to follow these guidelines. Please only bring one care partner. Please be aware that your care partner may wait in the car in the parking lot or if they feel like they will be too hot or cold to wait in the car, they may wait in the lobby on the 4th floor. All care partners are required to wear a mask the entire time (we do not have any that we can provide them), they need to practice social distancing, and we will do a Covid check for all patient's and care partners when you arrive. Also we will check their temperature and your temperature. If the care partner waits in their car they need to stay in the parking lot the entire time and we will call them on their cell phone when the patient is ready for discharge so they can bring the car to the front of the building. Also all patient's will need to wear a mask into building  WHAT TO WEAR/BRING   Wear loose fitting clothing that is easily removed. Do not wear any lotions, perfumes or colognes. You may wear deodorant. Leave jewelry and other valuables at home. However, you may wish to bring a book to read or another electronic device to listen to music as you wait for your procedure to start.  Your belongings will be given to your care partner before you go to the procedure room.  Remove all body piercing jewelry and leave at home.  You should not wear any red or dark colored fingernail polish.  Use of personal electronic devices such as cell phones, smart watches, Fitbits, iPods, iPads, MP3  players, audio recorders, etc will not be allowed past the waiting area. Patients will  be prompted to leave their device in the waiting area when they are called back to begin the admitting process.   WHAT TO EXPECT AFTER THE PROCEDURE   You may notice some feelings of bloating in the abdomen or passage of more gas than usual.  Walking can help get rid of the air that was put into your GI tract during the procedure and reduce the bloating. You may notice spotting of blood in your stool or on the toilet paper. Since you completed a bowel prep for your procedure, you may not have a normal bowel movement for a few days.  DIET   In general, your first meal following the procedure should be a light meal after which it is okay to return to your normal diet.  A half-sandwich or bowl of soup is an example of a good first meal.  Heavy or fried foods are harder to digest and may make you feel nauseous or bloated.  Drink plenty of fluids but you should avoid alcoholic beverages for 24 hours. These diet instructions may be modified depending on the results of your procedure.  ACTIVITY   Your care partner should take you home directly after the procedure.  You should plan to take it easy, moving slowly for the rest of the day.  You can resume normal activity the day after the procedure however you should NOT DRIVE or use heavy machinery for 24 hours (because of the sedation medicines used during the procedure).    FOLLOW UP AFTER YOUR PROCEDURE The next business day following your procedure, our staff will call the home or cell phone number listed on your records to check on you and address any questions or concerns that you may have.  If there is no answer at the number you provide [place], and we have not heard from you through the emergency physician on call, we will assume that you have returned to your regular daily activities without incident.   If any biopsies are taken during your procedure, you will be  contacted by phone or by letter within the following 2-3 weeks.  Please call your gastroenterologist at [phone] if you have not heard about the biopsies in 3 weeks.   CANCELLATION POLICY  We need ample time to take care of all of our patients, therefore, we require 2 full business days notice for non-emergent cancellations of any procedure.  Failure to give 2 full days notice may result in a fee:  $100 for a single procedure (upper or lower endoscopy)  FREQUENTLY ASKED QUESTIONS  WHEN DO I START THE PREP? Please refer to your personalized prep instructions regarding the start time.  We realize, however, that you may work until later than this time. If so, you can begin taking your preparation as soon as you get home.  Remember that the later you start, the later you will be up going to the bathroom. HOW CAN I IMPROVE THE TASTE OF THE PREP? All the solutions have a salty aftertaste. You can try any one or all of these suggestions to improve or overcome this taste:  Hold hard candy (not red or purple) in your mouth while drinking the solution.  Chase each glass with swallows of another beverage (juice, Coke, etc.).  Suck on a Popsicle or sucker while drinking the solution.  Chew flavored gum while drinking the solution. YOU MAY USE A STRAW. WHAT IF I GET SICK DURING THE PREP?  Stop drinking the solution and wait for 30-45  minutes. Let your system settle down. Try drinking small sips of Coke or other beverage.  Begin the solution again, using some of the suggestions above if the flavor is the problem.  WHEN WILL THE PREP START TO WORK?  Everyone is different in the amount of time that it takes the purge (laxative) to work. Some people begin to stool in the first hour, others not until the fourth hour or later. Activity is helpful in stimulating the bowel so, if possible, do not sit and wait for the bowel to act - remain active.  DO I HAVE TO DRINK ALL OF THE PREP?  Yes. In order to give you  the best examination possible, it is important to drink all of the solution in the set amount of time.  Some of the preps are "split dose," and are designed to work best if you drink the prep in two sittings. Your personalized intructions above will explain that in full detail. In the event that you have tried everything suggested and still cannot complete the preparation, please call us at [phone].  If it is after hours or on the weekend, the answering service will reach the doctor on call for you. WHAT TYPES OF SEDATION ARE USED AT THE [facility]? There are two types of sedation.  Your gastroenterologist will decide which type you will need based on your personal medical issues as well as their own preference. Moderate Sedation is achieved using a combination of an IV narcotic (fentanyl, Sublimaze) and an IV anxiolytic (midazolam, Versed). This results in a depressed state of consciousness which will allow you to be very relaxed and comfortable during the procedure but still able to respond to stimuli if needed.  You should not remember the procedure and will likely not remember the discharge instructions or discussion with the MD after the test is over.  You cannot drive or operate heavy machinery for 24 hours following the procedure.  You will not receive a separate bill for this type of sedation.  Occasionally this type of sedation is less effective than deep sedation for patients on certain chronic medications (pain medicines, antidepressants, antianxiety medicines) or in patients with a history of chronic alcohol use. Deep Sedation (Monitored Anesthesia Care) is achieved using a short acting IV anesthetic (diprivan, Propofol) that promotes relaxation and sleep.  This medicine is administered by a CRNA (nurse anesthetist) skilled and credentialed in [place].  You should not remember the procedure itself, but you should be able to remember the discharge instructions and the discussion with your  physician afterwards. You cannot drive or operate heavy machinery for 24 hours following the procedure. You will receive a separate bill for this type of sedation.  This type of sedation is generally more reliable for patients who take certain chronic medications or with certain medical conditions and so it may be preferred. If you have any questions about the type of sedation that will be used for your procedure, your gastroenterologist will be happy to discuss it with you. WHY DOES MY CARE PARTNER HAVE TO BE IN THE [place] DURING MY PROCEDURE? Endoscopic procedures are generally safe, but due to the risk of possible complications associated with the procedure and anesthesia it is our policy that someone be present during the procedure who will act as your spokesperson should the need arise for emergency intervention.  We cannot start the procedure unless your care partner is present in the facility waiting room. WHY DO I NEED A DRIVER?  The  medicines used for your sedation cause delayed reflexes, impair thinking and judgment, and have some amnesic effect, therefore affecting your ability to drive safely. Even though you may feel alright, you are instructed to refrain from driving, operating any type of machinery, making any critical decisions, or signing any legal documents for 24 hours following your procedure.  WHAT SHOULD I BRING WITH ME?  Leave jewelry, purses and wallets at home. Wear loose fitting, easily removed clothing (e.g. no pantyhose or girdles).  You may wish to bring a book to read or an electronic device to listen to music as you wait for your procedure to start.  CAN I WEAR DENTURES?  Yes, you may wear your dentures. However, you may be asked to remove them prior to your procedure.  CAN I  WEAR MY CONTACTS?  We advise that you leave your contact lenses at home and wear your glasses instead. If you do wear you lenses, you may be asked to remove them prior to your procedure so please bring a  case for them and a pair of glasses to wear after your procedure.  IS THE TEST SAFE DURING MY MENSTRUAL PERIOD?  Yes, your procedure can still be performed.  WILL THE DOCTOR TALK WITH ME AFTERWARDS? Yes, your physician will review the findings, follow-up care instructions, and treatment recommendations with you.  This will also be reviewed with your care partner if you have explicitly given [place] permission.  It is important that your care partner realizes you may not remember much after the procedure due to the effects of the anesthesia and so they will need to be able to review this information with you later. HOW LONG WILL I BE THERE? The whole process takes 2-3 hours.  Most of that time is spent checking you in and waking you up safely after the procedure.  The colonoscopy itself takes 20-30 minutes usually.  Obviously, there may be unforseen delays and we will appreciate your patience if that occurs. WHAT IF THE WEATHER IS TERRIBLE?  We will attempt to contact you by phone or mychart in the even of a weather related closing or delayed opening.  Please CONTACT OUR OFFICE WHEN SEVERE WEATHER IS FORECAST so we may advise you of our hours of operation.  Even when roads may be passable in your area, our facility may be closed.  [facility] news will broadcast our weather closings and updates.  If you choose to cancel due to severe weather, the cancellation fee would not apply.    YOUR FINANCIAL RESPONSIBILITY  If you have insurance, you will need to contact your insurance company to verify that you have active coverage and to determine the amount of coverage they will provide. It is important to tell them that you are having the procedure performed at an [facility]). They can tell you what portion of the cost will be your responsibility, usually expressed either as a fixed amount or as a percentage of overall cost. We will also be contacting your insurance company with information about your  procedure in order to obtain pre-certification.  You must contact your insurance company as well; failure to do so may result in you having to pay a greater portion of the cost or even the total cost of the procedure.  YOU MAY RECEIVE THE FOLLOWING BILLS The [facility] will bill a facility fee for use of the procedure room, medication and supplies. Your [facility] gastroenterologist will bill a professional fee for performing  the procedure. If a biopsy is performed you will receive a bill from [facility] for their professional fee and a bill from [employer] for processing the pathology sample. If you receive diprivan for sedation during your procedure, you will receive a bill from [facility] for the administration of the medication.  Complaints or questions regarding billing, payment by third party payers or payment plans can be directed to the Customer Service Department of [facility], [address] [place], [place], [place] [address].  Phone inquiries may be made at [phone], Monday through Friday 8 a.m. to 5 p.m., or you may visit the [facility] website at: [email or web address], click on "For Patients", then click "Questions about your bill."   [place] ([place])                                 The [place] ([place]) is an independent, freestanding [place] ([place]) located on the fourth floor of the [facility] at [place], [place].  It is licensed by the [place], certified by [employer] and is accredited by [employer].   The [facility] Gastroenterology physicians established the [facility] in 1992. The physicians of [employer] joined the [facility] in 1999, and [facility] is now owned by [employer].  We completed a major renovation in 2006 and again in 2020 at which time we expanded [place] to provide  greater privacy and comfort for our patients and their family members.  We have invested in state-of-the-art facilities and equipment to ensure that our patients receive the most up to date and best care.  At the [facility], board-certified gastroenterologists perform elective diagnostic and therapeutic endoscopic procedures such as endoscopy and colonoscopy.  Hours of operation are from 7:30 a.m. to 6 p.m. Monday - Friday.  Outside of the posted hours of operation, urgent or emergent care is provided at [facility] [place]. [facility] physicians also provide 24-hour emergency coverage.  After hours and on weekends, the on-call physician may be reached by calling [phone].  The answering service will take a message and have the physician on call contact you.  YOUR RIGHTS AS A PATIENT        Considerate, respectful, and safe care. A discussion of your condition or illness, what we can do about it, and the likely outcome of care. To know the names and the roles of people caring for you. You can consent or refuse any treatment within the law throughout your stay. The [facility] staff and doctors will protect your privacy as much as possible. Your health records are confidential unless you give permission for [place] to release them or law requires them to be released.  When the [facility] releases your records to others, like your insurance company, we ask them to maintain confidentiality. You can review your records and ask questions about them unless restricted by law. You can expect that the staff will give you needed health care to the best of their ability.  Treatment, referral, or transfer may be recommended.  If a transfer is needed, you will be told of the risks, benefits and other options. You have a right to know if the [facility] has relationships with outside parties that may influence your treatment or care.  These could be with educational facilities,  insurers, or other healthcare  givers.   You may consent or decline to take part in research.  If you decline, we will still provide you with the very best care. You have the right to know about [place] rules that affect you, your care, charges and payment methods. You have the right to know about [place] resources, such as the complaint process, that can help you resolve problems and questions about your stay and care.     YOUR RESPONSIBILITIES AS A PATIENT  You are responsible for giving [place] information about your health, such as past sickness, hospital stays and medications. You are to ask questions when you do not understand information or instructions. If you cannot follow through with your treatment, you must tell your doctor or nurse. You and your visitors are responsible for being considerate of the needs of other patients, staff and the center. You are responsible to provide information for insurance purposes and work with our billing staff to arrange for payment when necessary. Your health depends on the decisions you make in your daily life.  You are responsible for recognizing the effect of your life style on your health. You are asked to share your values, beliefs and traditions that help the staff in providing care that respects your values and dignity.  COMPLAINT/GRIEVANCE PROCESS   The [facility] recognizes that patients have the right to voice concerns without fear of discrimination or reprisal, and to have these concerns reviewed and responded to in a timely manner. LEC seeks to provide prompt review and timely resolution of complaints or grievances from any patient.    You may voice your concerns, complaints, or problems with the care you have received or are receiving to any staff member at any point in your care.  Every effort will be made to reconcile your concern or complaint while you are still in the [place].  However, if you wish to voice a concern or complaint after you have left the center, you may contact the Nursing  Supervisor at [phone] or our Administrative Director of Gastroenterology at [phone].  If we are unable to satisfactorily address your complaint, you may contact the [place], Complaint Intake Unit ([address] [place], [place], [place], [place] [address], phone: [phone] or [phone]) or by e-mail at [email or web address].    You may also contact the Office of the Medicare Ombudsman to file a grievance at [employer] ([phone]) or at [email or web address]   ADVANCE DIRECTIVE POLICY  The [facility] supports the adult patient's right to make decisions regarding the acceptance or refusal of medical and/or surgical treatments and recognizes Advance Directives as options to promote patient autonomy regarding treatment decisions.   However, due to the lack of a constant care relationship in the ambulatory care setting, if a patient should suffer a cardiac or respiratory arrest or other life-threatening situation, you and/or your healthcare power of attorney will be required to sign a form which implies consent for resuscitation and transfer to a higher level of care.  Therefore, the [facility] will not honor previously signed advance directives or verbal family agreements for any patient.  Should you not agree with the Center's policy on Advance Directives and decline to sign the form, your procedure cannot be performed in the [place].  However, the procedure may be scheduled in the hospital setting and performed by a gastroenterologist affiliated with [facility].   For applicable state laws and sample forms,  you may contact the [employer] at [phone] for English and [phone] for other languages or via the web at [email or web address].turner-[email or web address]/.  Other sources include the [place] of Aging and Adult Services [phone] at  http://massey-hart.com/ or www.[facility].org at 203-596-3042 or  www.nclifelinks.org or [email or web address].  SIGNATURES/CONFIDENTIALITY  You have signed paperwork which will be entered into your electronic medical record.  This attests to the fact that that the information above on has been reviewed and is understood.  Full responsibility of the confidentiality of the printed copy of this After Visit Summary lies with you and/or your care-partner.

## 2021-08-18 NOTE — Telephone Encounter (Addendum)
Reviewing charts for upcoming visits this week. 's patient has had rectal bleeding for the last few weeks. Had evaluation in the emergency department with a CT scan nearly a month ago with findings of a likely rectal mass concerning for malignancy and less likely inflammatory mass. Recently evaluated in the hospital emergency department 2 times within the last month without any other major medical history or symptoms. Anusol suppositories have not been helpful. In review of this imaging finding as well as the patient's clinical history, patient will benefit from urgent colonoscopy. I called and spoke with the patient and patient's daughter and discussed that we may have an opportunity to try and perform a colonoscopy for his symptoms. He has never had a colonoscopy in the past. He has no other major medical problems and has no other medications. We will move forward with a direct colonoscopy on Friday. Schedule this colonoscopy for 11 or 1130 so that the patient's daughter will be able to bring him. I told him to initiate the patient on a clear liquid diet for Thursday. I told him to initiate 10 mg of Dulcolax and a dose of MiraLAX on Wednesday evening. On Thursday morning, I will have my team reach out to the patient to work on going over preparation instructions to get his bowels moving and get him on the list for Friday. Any preparation is fine to use.  Patty, please work on scheduling this patient and getting them set up for colonoscopy this Friday and go ahead and cancel his Friday afternoon clinic visit.  Justice Britain, MD [facility] Office # [phone]

## 2021-08-19 ENCOUNTER — Telehealth: Payer: Self-pay

## 2021-08-19 ENCOUNTER — Ambulatory Visit (AMBULATORY_SURGERY_CENTER): Payer: Medicare Other | Admitting: Gastroenterology

## 2021-08-19 ENCOUNTER — Ambulatory Visit: Payer: Medicare Other | Admitting: Gastroenterology

## 2021-08-19 ENCOUNTER — Telehealth: Payer: Self-pay | Admitting: Gastroenterology

## 2021-08-19 ENCOUNTER — Encounter: Payer: Self-pay | Admitting: Gastroenterology

## 2021-08-19 ENCOUNTER — Other Ambulatory Visit: Payer: Medicare Other

## 2021-08-19 ENCOUNTER — Other Ambulatory Visit: Payer: Self-pay

## 2021-08-19 VITALS — BP 170/95 | HR 76 | Temp 98.0°F | Resp 21 | Ht 72.0 in | Wt 135.0 lb

## 2021-08-19 DIAGNOSIS — K5730 Diverticulosis of large intestine without perforation or abscess without bleeding: Secondary | ICD-10-CM

## 2021-08-19 DIAGNOSIS — C20 Malignant neoplasm of rectum: Secondary | ICD-10-CM | POA: Diagnosis not present

## 2021-08-19 DIAGNOSIS — D123 Benign neoplasm of transverse colon: Secondary | ICD-10-CM

## 2021-08-19 DIAGNOSIS — C210 Malignant neoplasm of anus, unspecified: Secondary | ICD-10-CM

## 2021-08-19 DIAGNOSIS — Z86010 Personal history of colonic polyps: Secondary | ICD-10-CM

## 2021-08-19 DIAGNOSIS — K6289 Other specified diseases of anus and rectum: Secondary | ICD-10-CM

## 2021-08-19 DIAGNOSIS — D126 Benign neoplasm of colon, unspecified: Secondary | ICD-10-CM | POA: Diagnosis not present

## 2021-08-19 DIAGNOSIS — K921 Melena: Secondary | ICD-10-CM

## 2021-08-19 DIAGNOSIS — C775 Secondary and unspecified malignant neoplasm of intrapelvic lymph nodes: Secondary | ICD-10-CM

## 2021-08-19 HISTORY — DX: Secondary and unspecified malignant neoplasm of intrapelvic lymph nodes: C77.5

## 2021-08-19 HISTORY — DX: Malignant neoplasm of rectum: C20

## 2021-08-19 MED ORDER — SODIUM CHLORIDE 0.9 % IV SOLN: 500.0000 mL | Freq: Once | INTRAVENOUS | Status: DC

## 2021-08-19 MED ORDER — OXYCODONE HCL 5 MG PO TABS: 5.0000 mg | ORAL_TABLET | Freq: Three times a day (TID) | ORAL | 0 refills | Status: DC | PRN

## 2021-08-19 NOTE — Addendum Note (Signed)
Addended by: Justice Britain on: 08/19/2021 01:28 PM   Modules accepted: Orders

## 2021-08-19 NOTE — Telephone Encounter (Signed)
They are running late but on their way. As of 7:35am they state they are about 5 minutes away.

## 2021-08-19 NOTE — Patient Instructions (Signed)
YOU HAD AN ENDOSCOPIC PROCEDURE TODAY AT THE [facility]:   Refer to the procedure report that was given to you for any specific questions about what was found during the examination.  If the procedure report does not answer your questions, please call your gastroenterologist to clarify.  If you requested that your care partner not be given the details of your procedure findings, then the procedure report has been included in a sealed envelope for you to review at your convenience later.  YOU SHOULD EXPECT: Some feelings of bloating in the abdomen. Passage of more gas than usual.  Walking can help get rid of the air that was put into your GI tract during the procedure and reduce the bloating. If you had a lower endoscopy (such as a colonoscopy or flexible sigmoidoscopy) you may notice spotting of blood in your stool or on the toilet paper. If you underwent a bowel prep for your procedure, you may not have a normal bowel movement for a few days.  Please Note:  You might notice some irritation and congestion in your nose or some drainage.  This is from the oxygen used during your procedure.  There is no need for concern and it should clear up in a day or so.  SYMPTOMS TO REPORT IMMEDIATELY:   Following lower endoscopy (colonoscopy or flexible sigmoidoscopy):  Excessive amounts of blood in the stool  Significant tenderness or worsening of abdominal pains  Swelling of the abdomen that is new, acute  Fever of 100F or higher  For urgent or emergent issues, a gastroenterologist can be reached at any hour by calling [phone]. Do not use MyChart messaging for urgent concerns.    DIET:  We do recommend a small meal at first, but then you may proceed to your regular diet.  Drink plenty of fluids but you should avoid alcoholic beverages for 24 hours.  ACTIVITY:  You should plan to take it easy for the rest of today and you should NOT DRIVE or use heavy machinery until tomorrow (because  of the sedation medicines used during the test).    FOLLOW UP: Our staff will call the number listed on your records 48-72 hours following your procedure to check on you and address any questions or concerns that you may have regarding the information given to you following your procedure. If we do not reach you, we will leave a message.  We will attempt to reach you two times.  During this call, we will ask if you have developed any symptoms of COVID 19. If you develop any symptoms (ie: fever, flu-like symptoms, shortness of breath, cough etc.) before then, please call [phone].  If you test positive for Covid 19 in the 2 weeks post procedure, please call and report this information to us.    If any biopsies were taken you will be contacted by phone or by letter within the next 1-3 weeks.  Please call us at [phone] if you have not heard about the biopsies in 3 weeks.    SIGNATURES/CONFIDENTIALITY: You and/or your care partner have signed paperwork which will be entered into your electronic medical record.  These signatures attest to the fact that that the information above on your After Visit Summary has been reviewed and is understood.  Full responsibility of the confidentiality of this discharge information lies with you and/or your care-partner. 

## 2021-08-19 NOTE — Progress Notes (Signed)
I left a vm for Mr Jorge [name] to return my call to schedule an appt with Dr Burr Medico.

## 2021-08-19 NOTE — Progress Notes (Signed)
   GASTROENTEROLOGY PROCEDURE H&P NOTE   Primary Care Physician: Pcp, No  HPI: Jorge [name] is a 70 y.o. male who presents for Colonoscopy for screening but has had rectal bleeding with CT imaging concerning for a rectal mass.  No past medical history on file. No past surgical history on file. Current Outpatient Medications  Medication Sig Dispense Refill   hydrocortisone (ANUSOL-HC) 2.5 % rectal cream Place 1 application rectally 2 (two) times daily. 30 g 0   No current facility-administered medications for this visit.    Current Outpatient Medications:    hydrocortisone (ANUSOL-HC) 2.5 % rectal cream, Place 1 application rectally 2 (two) times daily., Disp: 30 g, Rfl: 0 No Known Allergies No family history on file. Social History   Socioeconomic History   Marital status: Widowed    Spouse name: Not on file   Number of children: Not on file   Years of education: Not on file   Highest education level: Not on file  Occupational History   Not on file  Tobacco Use   Smoking status: Every Day    Types: Cigarettes   Smokeless tobacco: Never  Substance and Sexual Activity   Alcohol use: Not Currently   Drug use: Never   Sexual activity: Not on file  Other Topics Concern   Not on file  Social History Narrative   Not on file   Social Determinants of Health   Financial Resource Strain: Not on file  Food Insecurity: Not on file  Transportation Needs: Not on file  Physical Activity: Not on file  Stress: Not on file  Social Connections: Not on file  Intimate Partner Violence: Not on file    Physical Exam: There were no vitals filed for this visit. There is no height or weight on file to calculate BMI. GEN: NAD EYE: Sclerae anicteric ENT: MMM CV: Non-tachycardic GI: Soft, NT/ND NEURO:  Alert & Oriented x 3  Lab Results: No results for input(s): WBC, HGB, HCT, PLT in the last 72 hours. BMET No results for input(s): NA, K, CL, CO2, GLUCOSE, BUN, CREATININE,  CALCIUM in the last 72 hours. LFT No results for input(s): PROT, ALBUMIN, AST, ALT, ALKPHOS, BILITOT, BILIDIR, IBILI in the last 72 hours. PT/INR No results for input(s): LABPROT, INR in the last 72 hours.   Impression / Plan: This is a 70 y.o.male who presents for Colonoscopy for screening but has had rectal bleeding with CT imaging concerning for a rectal mass.  The risks and benefits of endoscopic evaluation/treatment were discussed with the patient and/or family; these include but are not limited to the risk of perforation, infection, bleeding, missed lesions, lack of diagnosis, severe illness requiring hospitalization, as well as anesthesia and sedation related illnesses.  The patient's history has been reviewed, patient examined, no change in status, and deemed stable for procedure.  The patient and/or family is agreeable to proceed.    Justice Britain, MD [facility] Office # [phone]

## 2021-08-19 NOTE — Op Note (Addendum)
Jorge [name] Patient Name: Danne Vasek Procedure Date: 08/19/2021 8:36 AM MRN: [id number] Endoscopist: Justice Britain , MD Age: 70 Referring MD:  Date of Birth: [birth year] Gender: Male Account #: [id number] Procedure:                Colonoscopy Indications:              Screening for colorectal malignant neoplasm, This                            is the patient's first colonoscopy, Incidental -                            Lower abdominal pain, Incidental - Hematochezia,                            Incidental - Abnormal CT of the GI tract,                            Incidental - Change in bowel habits, Incidental -                            Change in stool caliber Medicines:                Monitored Anesthesia Care Procedure:                Pre-Anesthesia Assessment:                           - Prior to the procedure, a History and Physical                            was performed, and patient medications and                            allergies were reviewed. The patient's tolerance of                            previous anesthesia was also reviewed. The risks                            and benefits of the procedure and the sedation                            options and risks were discussed with the patient.                            All questions were answered, and informed consent                            was obtained. Prior Anticoagulants: The patient has                            taken no previous anticoagulant or antiplatelet  agents. ASA Grade Assessment: II - A patient with                            mild systemic disease. After reviewing the risks                            and benefits, the patient was deemed in                            satisfactory condition to undergo the procedure.                           After obtaining informed consent, the colonoscope                            was passed under direct vision. Throughout  the                            procedure, the patient's blood pressure, pulse, and                            oxygen saturations were monitored continuously. The                            0405 PCF-H190TL Slim SB Colonoscope was introduced                            through the anus and advanced to the the cecum,                            identified by appendiceal orifice and ileocecal                            valve. The colonoscopy was somewhat difficult due                            to a partially obstructing mass, inadequate bowel                            prep and a redundant colon. Successful completion                            of the procedure was aided by changing the                            patient's position, using manual pressure,                            straightening and shortening the scope to obtain                            bowel loop reduction and using scope torsion. Scope In: 8:59:32 AM Scope Out: 9:26:32 AM Scope Withdrawal Time: 0 hours 15 minutes 42  seconds  Total Procedure Duration: 0 hours 27 minutes 0 seconds  Findings:                 The digital rectal exam findings include rectal                            tenderness, palpable rectal mass and hemorrhoids.                           Extensive amounts of semi-liquid stool was found in                            the entire colon, interfering with visualization.                            Lavage of the area was performed using copious                            amounts, resulting in incomplete clearance with                            continued poor visualization.                           A 35 mm polyp was found in the presumed hepatic                            flexure. The polyp was non-granular lateral                            spreading. A single biopsy was taken with a cold                            forceps for histology. Area distal to the polyp was                            tattooed with an  injection of Spot (carbon black) -                            this is for demarcation purposes in future if                            endoscopic resection were to be considered.                           Four sessile polyps were found in the transverse                            colon (3) and hepatic flexure (1). The polyps were                            5 to 11 mm in size. These polyps were removed with  a cold snare. Resection and retrieval were complete.                           A few small-mouthed diverticula were found in the                            recto-sigmoid colon and sigmoid colon.                           A frond-like/villous, fungating, infiltrative and                            ulcerated partially obstructing large mass was                            found in the rectum and from 6 to 13 cm proximal to                            the anus. The mass was circumferential. The mass                            measured seven cm in length. Oozing was present.                            Biopsies were taken with a cold forceps for                            histology - these will be rushed. Complications:            No immediate complications. Estimated Blood Loss:     Estimated blood loss was minimal. Impression:               - Rectal tenderness, palpable rectal mass and                            hemorrhoids found on digital rectal exam.                           - Stool in the entire examined colon - lavaged with                            still inadequate clearance.                           - One 35 mm polyp at the hepatic flexure. Biopsied.                            Tattooed distal in case future endoscopic resection                            is considered - however, has larger issues with                            rectal mass currently as below.                           -  Four, 5 to 11 mm polyps in the transverse colon                             and at the hepatic flexure, removed with a cold                            snare. Resected and retrieved.                           - Diverticulosis in the recto-sigmoid colon and in                            the sigmoid colon.                           - Rule out malignancy, partially obstructing tumor                            in the rectum and from 6 to 13 cm proximal to the                            anus. Biopsied. Recommendation:           - The patient will be observed post-procedure,                            until all discharge criteria are met.                           - Discharge patient to home.                           - Patient has a contact number available for                            emergencies. The signs and symptoms of potential                            delayed complications were discussed with the                            patient. Return to normal activities tomorrow.                            Written discharge instructions were provided to the                            patient.                           - Low residue diet.                           - Stool softeners daily.                           -  Miralax daily to keep bowels moving.                           - Continue present medications.                           - Await pathology results.                           - CEA level to be obtained today.                           - CT-Chest without IV contrast for completion of                            staging to be scheduled.                           - MRI-Pelvis for completion of staging to be                            scheduled.                           - Referral to Oncology and Surgical Oncology and                            Radiation Oncology.                           - For pain will send a short prescription of                            Oxycodone 5 mg Q8H PRN to alternate with Tylenol                            1000 mg Q8H PRN.                            - The findings and recommendations were discussed                            with the patient.                           - The findings and recommendations were discussed                            with the patient's family. Justice Britain, MD 08/19/2021 9:37:52 AM

## 2021-08-19 NOTE — Telephone Encounter (Signed)
-----   Message from Jackowski Copas., MD sent at 08/19/2021  9:38 AM EDT ----- Regarding: New rectal cancer diagnosis Keishawn Darsey, This patient has a new rectal mass, assuredly a rectal cancer with invasiveness. The pathology has been rushed and should be back by Monday. Patient needs a CT chest without contrast. Patient needs a pelvic MRI with/without contrast for completion of the staging. Please call the patient's daughter to help with scheduling appointments. Please place a referral to oncology/radiation oncology/colorectal surgery.  Santiago Glad, Can you help with expediting the oncology and radiation oncology visit because the patient is already partially obstructed?  Thanks to all. GM

## 2021-08-19 NOTE — Telephone Encounter (Signed)
I have sent the orders for CT and MRI to the schedulers to set up ASAP. Referral to [facility], Onc, and Rad onc have been sent.  I will confirm that appts have been made on Monday.

## 2021-08-19 NOTE — Progress Notes (Signed)
Called to room to assist during endoscopic procedure.  Patient ID and intended procedure confirmed with present staff. Received instructions for my participation in the procedure from the performing physician.  

## 2021-08-22 ENCOUNTER — Encounter: Payer: Self-pay | Admitting: Gastroenterology

## 2021-08-22 ENCOUNTER — Telehealth: Payer: Self-pay

## 2021-08-22 DIAGNOSIS — C20 Malignant neoplasm of rectum: Secondary | ICD-10-CM | POA: Insufficient documentation

## 2021-08-22 LAB — CEA: CEA: 561 ng/mL — ABNORMAL HIGH

## 2021-08-22 NOTE — Telephone Encounter (Signed)
-----   Message from [name], MD sent at 08/22/2021  4:14 PM EDT ----- Regarding: RE: New rectal cancer diagnosis Got it, I confirmed with Nilesh also.   I will discuss the result with pt tomorrow, no worries  Krista Blue  ----- Message ----- From: Corsetti Copas., MD Sent: 08/22/2021   3:43 PM EDT To: Timothy Lasso, RN, [name], MD, # Subject: RE: New rectal cancer diagnosis                I tried calling the patient's daughter now and could not leave a voicemail but sent a SMS message to try and have her call back. But again after speaking with Dr. 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 Thanks. GM ----- Message ----- From: Royston Bake, RN Sent: 08/22/2021   3:27 PM EDT To: Timothy Lasso, RN, Ashkar Copas., MD Subject: RE: New rectal cancer diagnosis                Jorge [name] is seeing Dr Burr Medico tomorrow morning.  The path is not back yet.  Where to do send your biopsies?  KB ----- Message ----- From: Dunkleberger Copas., MD Sent: 08/19/2021   4:37 PM EDT To: Timothy Lasso, RN, Royston Bake, RN Subject: RE: New rectal cancer diagnosis                KB, Thanks for reaching out. Calling his daughter is the best way of getting a hold of him. She is POA and will help with getting him to all of his appointments and imaging appointments. Thanks. GM ----- Message ----- From: Royston Bake, RN Sent: 08/19/2021   2:37 PM EDT To: Timothy Lasso, RN, Christopherson Copas., MD Subject: RE: New rectal cancer diagnosis                I am scheduling him with Dr Burr Medico on Tuesday 10/18 at [facility].  I left a message for him to call me.  KB ----- Message ----- From: Talamante Copas., MD Sent: 08/19/2021  10:44 AM EDT To: Timothy Lasso, RN, Royston Bake, RN Subject: New rectal cancer diagnosis                    Jorge [name], This patient has a new rectal mass, assuredly a rectal cancer with invasiveness. The  pathology has been rushed and should be back by Monday. Patient needs a CT chest without contrast. Patient needs a pelvic MRI with/without contrast for completion of the staging. Please call the patient's daughter to help with scheduling appointments. Please place a referral to oncology/radiation oncology/colorectal surgery.  Jorge [name], Can you help with expediting the oncology and radiation oncology visit because the patient is already partially obstructed?  Thanks to all. GM

## 2021-08-22 NOTE — Progress Notes (Signed)
I spoke with Mr Jorge [name] daughter Jorge [name].  She his POA.  I reviewed available consult appt with Dr Burr Medico tomorrow and they are agreeable.  I provided the appt details, location, and the available valet services.  All questions were answered.  She verbalized understanding.

## 2021-08-22 NOTE — Progress Notes (Signed)
Jorge [name]   [phone] [phone]   Clinic New Consult Note   Patient Care Team: Pcp, No as PCP - General  Date of Service:  08/23/2021   CHIEF COMPLAINTS/PURPOSE OF CONSULTATION:  Rectal Adenocarcinoma  REFERRING PHYSICIAN:  Dr. Rush [name]  ASSESSMENT & PLAN:  Jorge [name] is a 70 y.o.  male who has not seen doctor for several years   1. Rectal Adenocarcinoma, cTxNxMx -presented with rectal bleeding and frequent bowel movement. CT AP 07/23/21 showed rectal mass with no significant adenopathy or distant metastasis.. Colonoscopy 08/19/21 showed a 7 cm infiltrative mass in the mid rectum ( 6-13cm from anal verge), pathology showed adenocarcinoma. MMR is pending  -His CT chest and pelvic MRI staging scans are pending -I discussed the standard treatment options for stage I-III rectal cancer.  If he has T3 lesion and no enlarged lymph nodes, I would recommend concurrent neoadjuvant chemo and radiation, followed by surgery and adjuvant chemo. If he has T4 or positive nodes, then I will recommend total neoadjuvant therapy with FOLFOX for 4 months, concurrent chemoradiation then followed by surgery. -He is scheduled to see Dr. Lisbeth [name] today. -We will refer him to colorectal surgeon  2.  Multiple colon polyps -He has a large 3.5 cm polyps in the hepatic flexure, biopsy showed tubular adenoma.  Jorge [name] plan to repeat a colonoscopy in 6 months and remove it -He had additional 4 small polyps which were removed, path showed tubular adenomas.  3.  Mild anemia from rectal bleeding, probable iron deficiency -He has started oral iron, tolerating well.  We will continue.  4.  Rectal pain -Secondary to rectal cancer, overall mild, he is on oxycodone as needed, usually takes at night  5.  Weight loss and fatigue -Dietitian referral     PLAN:  -We will schedule his CT chest and pelvic MRI for staging -Plan to call him after the above staging  work-up, to determine the treatment plan. -Dietitian and colorectal surgery referral   Oncology History  Rectal cancer ([place])  07/23/2021 Imaging   CT AP  IMPRESSION: 1. Appearance of the rectum likely indicates a rectal mass suspicious for rectal carcinoma. Inflammatory process would be less likely. Direct visualization is suggested. 2. Cholelithiasis without evidence of acute cholecystitis. 3. Aortic atherosclerosis.   08/19/2021 Procedure   Colonoscopy, under Jorge [name]  Impression: - Rectal tenderness, palpable rectal mass and hemorrhoids found on digital rectal exam. - Stool in the entire examined colon - lavaged with still inadequate clearance. - One 35 mm polyp at the hepatic flexure. Biopsied. Tattooed distal in case future endoscopic resection is considered - however, has larger issues with rectal mass currently as below. - Four, 5 to 11 mm polyps in the transverse colon and at the hepatic flexure, removed with a cold snare. Resected and retrieved. - Diverticulosis in the recto-sigmoid colon and in the sigmoid colon. - Rule out malignancy, partially obstructing tumor in the rectum and from 6 to 13 cm proximal to the anus. Biopsied.   08/22/2021 Initial Diagnosis   Rectal cancer ([place])      HISTORY OF PRESENTING ILLNESS:  Jorge [name] 70 y.o. male is a here because of rectal cancer. The patient was referred by Jorge [name]. The patient presents to the clinic today accompanied by his daughter.   He initially presented to ED with hematochezia and intermittent diarrhea on 07/23/21.  His symptoms started in early August.  Rectal bleeding is intermittent, sometimes severe.  He  had frequent bowel movement with loose stool, 8-10 times a day.  He also reports some rectal discomfort when he sits, no severe pain.  He underwent CT AP at that time showed a rectal mass.  He does not have a primary care physician, and was referred to GI by ED physician.  He proceeded to colonoscopy  on 08/19/21 under Jorge [name] showing a 7 cm infiltrative and ulcerated partially-obstructing rectal mass, and additional 5 polyps in colon, including I 3.5 cm polyps in the hepatic flexure which was biopsied.   He has started using Dulcolax every night, and MiraLAX as needed, which has significantly improved his bowel movement and hematochezia.  He has regular bowel movement once a day, minimal rectal bleeding now.  He also started oral iron and tolerates well.  He lives with his oldest daughter Jorge [name] in [place].  He is retired, helps to take care of grandchildren.  He used to be fully functional, has noticed decreased appetite and energy level lately, he lost 30 pounds before the ED visit, did again 5 to 6 pounds back in the past few weeks.  He is still able to function well at home, but less active than before.   He has a PMHx of left breast surgery for benign reason, he was on Coumadin for 3 to 6 months for unclear reason 7 to 8 years ago.  He has not seen physician for the past 7 to 8 years since he moved to [place].    REVIEW OF SYSTEMS:    Constitutional: Denies fevers, chills or abnormal night sweats, (+) mild fatigue and a total of 25 pound weight loss Eyes: Denies blurriness of vision, double vision or watery eyes Ears, nose, mouth, throat, and face: Denies mucositis or sore throat Respiratory: Denies cough, dyspnea or wheezes Cardiovascular: Denies palpitation, chest discomfort or lower extremity swelling Gastrointestinal:  Denies nausea, heartburn, see HPI  Skin: Denies abnormal skin rashes Lymphatics: Denies new lymphadenopathy or easy bruising Neurological:Denies numbness, tingling or new weaknesses Behavioral/Psych: Mood is stable, no new changes  All other systems were reviewed with the patient and are negative.   MEDICAL HISTORY:  History reviewed. No pertinent past medical history.  SURGICAL HISTORY: Past Surgical History:  Procedure Laterality Date   left  breast surgery Left 1968    SOCIAL HISTORY: Social History   Socioeconomic History   Marital status: Widowed    Spouse name: Not on file   Number of children: 5   Years of education: Not on file   Highest education level: Not on file  Occupational History   Not on file  Tobacco Use   Smoking status: Some Days    Packs/day: 0.25    Years: 20.00    Pack years: 5.00    Types: Cigarettes   Smokeless tobacco: Never  Substance and Sexual Activity   Alcohol use: Not Currently    Comment: 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, stopped in 02/2021   Drug use: Never   Sexual activity: Not on file  Other Topics Concern   Not on file  Social History Narrative   Not on file   Social Determinants of Health   Financial Resource Strain: Not on file  Food Insecurity: Not on file  Transportation Needs: Not on file  Physical Activity: Not on file  Stress: Not on file  Social Connections: Not on file  Intimate Partner Violence: Not on file    FAMILY HISTORY: Family History  Problem Relation Age of  Onset   Cancer Mother 55       unknown type cancer   Colon cancer Neg Hx    Esophageal cancer Neg Hx    Rectal cancer Neg Hx    Stomach cancer Neg Hx     ALLERGIES:  has No Known Allergies.  MEDICATIONS:  Current Outpatient Medications  Medication Sig Dispense Refill   hydrocortisone (ANUSOL-HC) 2.5 % rectal cream Place 1 application rectally 2 (two) times daily. 30 g 0   oxyCODONE (ROXICODONE) 5 MG immediate release tablet Take 1 tablet (5 mg total) by mouth every 8 (eight) hours as needed for up to 7 days for severe pain. 28 tablet 0   No current facility-administered medications for this visit.    PHYSICAL EXAMINATION: ECOG PERFORMANCE STATUS: 1 - Symptomatic but completely ambulatory  Vitals:   08/23/21 0927  BP: (!) 171/88  Pulse: 79  Resp: (!) 22  Temp: (!) 97.2 F (36.2 C)  SpO2: 100%   Filed Weights   08/23/21 0927  Weight: 142 lb 11.2 oz (64.7 kg)     GENERAL:alert, no distress and comfortable SKIN: skin color, texture, turgor are normal, no rashes or significant lesions EYES: normal, Conjunctiva are pink and non-injected, sclera clear NECK: supple, thyroid normal size, non-tender, without nodularity LYMPH:  no palpable lymphadenopathy in the cervical, axillary  LUNGS: clear to auscultation and percussion with normal breathing effort HEART: regular rate & rhythm and no murmurs and no lower extremity edema ABDOMEN:abdomen soft, non-tender and normal bowel sounds. Pt declined rectal exam today  Musculoskeletal:no cyanosis of digits and no clubbing  NEURO: alert & oriented x 3 with fluent speech, no focal motor/sensory deficits  LABORATORY DATA:  I have reviewed the data as listed CBC Latest Ref Rng & Units 08/11/2021 07/23/2021  WBC 4.0 - 10.5 K/uL 7.7 8.7  Hemoglobin 13.0 - 17.0 g/dL 12.3(L) 13.4  Hematocrit 39.0 - 52.0 % 38.0(L) 42.6  Platelets 150 - 400 K/uL 397 450(H)    CMP Latest Ref Rng & Units 08/11/2021 07/23/2021  Glucose 70 - 99 mg/dL 98 102(H)  BUN 8 - 23 mg/dL 13 15  Creatinine 0.61 - 1.24 mg/dL 0.71 0.74  Sodium 135 - 145 mmol/L 138 137  Potassium 3.5 - 5.1 mmol/L 3.9 4.1  Chloride 98 - 111 mmol/L 107 102  CO2 22 - 32 mmol/L 25 27  Calcium 8.9 - 10.3 mg/dL 9.0 9.2  Total Protein 6.5 - 8.1 g/dL 7.9 8.1  Total Bilirubin 0.3 - 1.2 mg/dL 0.7 0.4  Alkaline Phos 38 - 126 U/L 88 91  AST 15 - 41 U/L 18 25  ALT 0 - 44 U/L 14 24     RADIOGRAPHIC STUDIES: I have personally reviewed the radiological images as listed and agreed with the findings in the report. No results found.   Orders Placed This Encounter  Procedures   Ambulatory Referral to [facility] Nutrition    Referral Priority:   Routine    Referral Type:   Consultation    Referral Reason:   Specialty Services Required    Number of Visits Requested:   1   Ambulatory referral to General Surgery    Referral Priority:   Routine    Referral Type:   Surgical     Referral Reason:   Specialty Services Required    Requested Specialty:   General Surgery    Number of Visits Requested:   1     All questions were answered. The patient knows to call the  clinic with any problems, questions or concerns. The total time spent in the appointment was 50 minutes.     [name], MD 08/23/2021 10:15 AM  I, Wilburn Mylar, am acting as scribe for [name], MD.   I have reviewed the above documentation for accuracy and completeness, and I agree with the above.

## 2021-08-23 ENCOUNTER — Telehealth: Payer: Self-pay

## 2021-08-23 ENCOUNTER — Ambulatory Visit
Admission: RE | Admit: 2021-08-23 | Discharge: 2021-08-23 | Disposition: A | Discharge status: Home / Self Care | Payer: Medicare Other | Source: Ambulatory Visit | Attending: Radiation Oncology | Admitting: Radiation Oncology

## 2021-08-23 ENCOUNTER — Inpatient Hospital Stay: Payer: Medicare Other | Attending: Hematology | Admitting: Hematology

## 2021-08-23 ENCOUNTER — Encounter: Payer: Self-pay | Admitting: Hematology

## 2021-08-23 ENCOUNTER — Other Ambulatory Visit: Payer: Self-pay

## 2021-08-23 ENCOUNTER — Encounter: Payer: Self-pay | Admitting: Radiation Oncology

## 2021-08-23 VITALS — BP 149/78 | HR 81 | Temp 97.1°F | Resp 18 | Wt 142.2 lb

## 2021-08-23 DIAGNOSIS — R634 Abnormal weight loss: Secondary | ICD-10-CM | POA: Insufficient documentation

## 2021-08-23 DIAGNOSIS — C20 Malignant neoplasm of rectum: Secondary | ICD-10-CM | POA: Insufficient documentation

## 2021-08-23 DIAGNOSIS — F17210 Nicotine dependence, cigarettes, uncomplicated: Secondary | ICD-10-CM | POA: Insufficient documentation

## 2021-08-23 DIAGNOSIS — K625 Hemorrhage of anus and rectum: Secondary | ICD-10-CM | POA: Insufficient documentation

## 2021-08-23 DIAGNOSIS — Z79899 Other long term (current) drug therapy: Secondary | ICD-10-CM | POA: Insufficient documentation

## 2021-08-23 DIAGNOSIS — D649 Anemia, unspecified: Secondary | ICD-10-CM | POA: Insufficient documentation

## 2021-08-23 DIAGNOSIS — M899 Disorder of bone, unspecified: Secondary | ICD-10-CM | POA: Insufficient documentation

## 2021-08-23 DIAGNOSIS — G893 Neoplasm related pain (acute) (chronic): Secondary | ICD-10-CM | POA: Insufficient documentation

## 2021-08-23 NOTE — Telephone Encounter (Signed)
  Follow up Call-  Call back number 08/19/2021  Post procedure Call Back phone  # [phone]  Permission to leave phone message Yes    Attempted to leave a message but mailbox is full

## 2021-08-23 NOTE — Progress Notes (Signed)
I spoke with [name].  I gave her the date, time, location, and instructions for CT and MRI I scheduled for her father.  All questions were answered.  She verbalized understanding.

## 2021-08-23 NOTE — Progress Notes (Signed)
Radiation Oncology         [phone]) [phone] ________________________________  Name: Jorge [name]        MRN: [id number]  Date of Service: 08/23/2021 DOB: [birth year]  CC:Pcp, No  Mansouraty, Telford Nab.*     REFERRING PHYSICIAN: Mansouraty, Telford Nab.*   DIAGNOSIS: The encounter diagnosis was Rectal cancer ([place]).   HISTORY OF PRESENT ILLNESS: Jorge [name] is a 70 y.o. male seen at the request of Dr. [name] for a new diagnosis of adenocarcinoma of the rectum.  The patient presented for symptoms of several months of rectal bleeding to the emergency department.  He had had a CT scan performed in a separate emergency department visit in September 2022 showing concerns for a rectal mass, he was scheduled with gastroenterology but progressive bleeding and development of rectal pain drove him to be seen again in the emergent setting. On 08/19/2021 he underwent colonoscopy, biopsies of the hepatic flexure showed tubular adenoma without high-grade dysplasia or malignancy, transverse colon and additional hepatic flexure polyps totaling 4 and the specimen showed tubular adenoma without high-grade dysplasia or malignancy and again fragments of polypoid colonic mucosa without histopathologic change and food material, biopsy of the rectum showed adenocarcinoma.  An MRI of the pelvis and CT chest without contrast have been ordered and he is seen to discuss next steps for his treatment. He is also seeing [name] today.     PREVIOUS RADIATION THERAPY: No   PAST MEDICAL HISTORY: History reviewed. No pertinent past medical history.     PAST SURGICAL HISTORY: Past Surgical History:  Procedure Laterality Date   left breast surgery Left 1968     FAMILY HISTORY:  Family History  Problem Relation Age of Onset   Cancer Mother 69       unknown type cancer   Colon cancer Neg Hx    Esophageal cancer Neg Hx    Rectal cancer Neg Hx    Stomach cancer Neg Hx      SOCIAL HISTORY:  reports that  he has been smoking cigarettes. He has a 5.00 pack-year smoking history. He has never used smokeless tobacco. He reports that he does not currently use alcohol. He reports that he does not use drugs. The patient is widowed and lives in [place]. He is accompanied by his daughter [name]. 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    ALLERGIES: Patient has no known allergies.   MEDICATIONS:  Current Outpatient Medications  Medication Sig Dispense Refill   oxyCODONE (ROXICODONE) 5 MG immediate release tablet Take 1 tablet (5 mg total) by mouth every 8 (eight) hours as needed for up to 7 days for severe pain. 28 tablet 0   hydrocortisone (ANUSOL-HC) 2.5 % rectal cream Place 1 application rectally 2 (two) times daily. (Patient not taking: Reported on 08/23/2021) 30 g 0   No current facility-administered medications for this encounter.     REVIEW OF SYSTEMS: On review of systems, the patient reports that he is doing fairly well overall. He reports he has pain when sitting for long periods, as well as constipation taking OTC cathartics, and with much mucous production and passing of this. He does not have any rectal bleeding, and he's lost about 25-30 pounds in the last 2-3 months. No other complaints are verbalized.   PHYSICAL EXAM:  Wt Readings from Last 3 Encounters:  08/23/21 142 lb  3.2 oz (64.5 kg)  08/23/21 142 lb 11.2 oz (64.7 kg)  08/19/21 135 lb (61.2 kg)   Temp Readings from Last 3 Encounters:  08/23/21 (!) 97.1 F (36.2 C)  08/23/21 (!) 97.2 F (36.2 C) (Tympanic)  08/19/21 98 F (36.7 C) (Temporal)   BP Readings from Last 3 Encounters:  08/23/21 (!) 149/78  08/23/21 (!) 171/88  08/19/21 (!) 170/95   Pulse Readings from Last 3 Encounters:  08/23/21 81  08/23/21 79  08/19/21 76   Pain Assessment Pain Score: 0-No pain/10  In general this is a tired appearing thin  African American male in no acute distress. He's alert and oriented x4 and appropriate throughout the examination. Cardiopulmonary assessment is negative for acute distress and he exhibits normal effort.     ECOG = 1  0 - Asymptomatic (Fully active, able to carry on all predisease activities without restriction)  1 - Symptomatic but completely ambulatory (Restricted in physically strenuous activity but ambulatory and able to carry out work of a light or sedentary nature. For example, light housework, office work)  2 - Symptomatic, <50% in bed during the day (Ambulatory and capable of all self care but unable to carry out any work activities. Up and about more than 50% of waking hours)  3 - Symptomatic, >50% in bed, but not bedbound (Capable of only limited self-care, confined to bed or chair 50% or more of waking hours)  4 - Bedbound (Completely disabled. Cannot carry on any self-care. Totally confined to bed or chair)  5 - Death   Eustace Pen MM, Creech RH, Tormey DC, et al. [phone]). "Toxicity and response criteria of the [facility] Group". [place] Oncol. 5 (6): 649-55    LABORATORY DATA:  Lab Results  Component Value Date   WBC 7.7 08/11/2021   HGB 12.3 (L) 08/11/2021   HCT 38.0 (L) 08/11/2021   MCV 88.4 08/11/2021   PLT 397 08/11/2021   Lab Results  Component Value Date   NA 138 08/11/2021   K 3.9 08/11/2021   CL 107 08/11/2021   CO2 25 08/11/2021   Lab Results  Component Value Date   ALT 14 08/11/2021   AST 18 08/11/2021   ALKPHOS 88 08/11/2021   BILITOT 0.7 08/11/2021      RADIOGRAPHY: No results found.     IMPRESSION/PLAN: 1. Adenocarcinoma of the rectum. Dr. [name] discusses the pathology findings and reviews the nature of rectal carcinoma. Dr. [name] recommends completing his staging work up with CT chest and MRI pelvis. If he has locally advanced disease he would benefit from neoadjuvant therapy either with total neoadjuvant chemotherapy  upfront or chemoRT, then followed by surgery. He will be seen as well in Colorectal Surgery and we will keep their team informed of treatment dates as is relates to later surgery.   We discussed the risks, benefits, short, and long term effects of radiotherapy, as well as the curative intent, and the patient is interested in proceeding. Dr. [name] discusses the delivery and logistics of radiotherapy and anticipates a course of 5 1/2 weeks of radiotherapy if he has locally advanced disease, and reviewed the possibile timing of therapy following total neoadjuvant therapy. Written consent is obtained and placed in the chart, a copy was provided to the patient. He is also aware that early limited stage disease may involve upfront surgery. We will discuss his case in GI oncology conference once his MRI and CT scans have been performed later this week.  In a visit lasting 60 minutes, greater than 50% of the time was spent face to face discussing the patient's condition, in preparation for the discussion, and coordinating the patient's care.    The above documentation reflects my direct findings during this shared patient visit. Please see the separate note by Dr. [name] on this date for the remainder of the patient's plan of care.    Carola Rhine, [facility]   **Disclaimer: This note was dictated with voice recognition software. Similar sounding words can inadvertently be transcribed and this note may contain transcription errors which may not have been corrected upon publication of note.**

## 2021-08-23 NOTE — Progress Notes (Signed)
I met with Jorge [name] and his daughter, [name].  I explained my role as a Art therapist.  I provided my contact information. I reviewed Dr Ernestina Penna plan.  I let them know that I would schedule his ct scan and MRI and will call [name] with the appt dates, times, locations, and instructions.  I briefly reviewed support service offered at the cancer center.  I provided information regarding FMLA form completion.  All questions were answered.  They verbalized understanding. I escorted them to radiation oncology. I faxed a referral, Dr Ernestina Penna consultation note, pathology, colonoscopy report, demographic and insurance information to [facility] at [phone].

## 2021-08-23 NOTE — Progress Notes (Signed)
GI Location of Tumor / Histology: Rectal Cancer- Adenocarcinoma  Jorge [name] presented with complaints of rectal bleeding for several months.  MRI Pelvis: unscheduled at this time.  CT Chest: unscheduled at this time.  Colonoscopy 08/19/2021  CT AP 07/23/2021: The rectum appears to be expanded and heterogeneous, more than would be expected with stool. Given the symptoms, this is suspicious for a circumferential rectal mass, likely neoplastic. There is infiltration into the perirectal fat with suggestion of a small perirectal hypodense mass or collection.  Biopsies of Rectal Mass 08/19/2021    Past/Anticipated interventions by surgeon, if any:   Past/Anticipated interventions by medical oncology, if any:  [name] 08/23/2021 0920 -presented with rectal bleeding and frequent bowel movement. CT AP 07/23/21 showed rectal mass with no significant adenopathy or distant metastasis.. Colonoscopy 08/19/21 showed a 7 cm infiltrative mass in the mid rectum ( 6-13cm from anal verge), pathology showed adenocarcinoma. MMR is pending  -His CT chest and pelvic MRI staging scans are pending -I discussed the standard treatment options for stage I-III rectal cancer.  If he has T3 lesion and no enlarged lymph nodes, I would recommend concurrent neoadjuvant chemo and radiation, followed by surgery and adjuvant chemo. If he has T4 or positive nodes, then I will recommend total neoadjuvant therapy with FOLFOX for 4 months, concurrent chemoradiation then followed by surgery. -He is scheduled to see Dr. [name] today. -We will refer him to colorectal surgeon   Weight changes, if any: Has lost  about 25-30 pounds over the past 2-3 months.  Bowel/Bladder complaints, if any: Taking miralax and dulcolax.  Nausea / Vomiting, if any: no  Pain issues, if any: Has mild pain in her rectum.  Any blood per rectum: No  SAFETY ISSUES: Prior radiation? No Pacemaker/ICD? No Possible current pregnancy? N/a Is  the patient on methotrexate? No  Current Complaints/Details:

## 2021-08-23 NOTE — Telephone Encounter (Signed)
  Follow up Call-  Call back number 08/19/2021  Post procedure Call Back phone  # [phone]  Permission to leave phone message Yes     Attempted to leave a message but mailbox was full

## 2021-08-25 NOTE — Progress Notes (Signed)
I spoke with Mr Mcmeen daughter, [name].  They are agreeable to f/u appt with Dr Burr Medico on 10/26 at 1540.

## 2021-08-26 ENCOUNTER — Other Ambulatory Visit: Payer: Self-pay

## 2021-08-26 ENCOUNTER — Ambulatory Visit (HOSPITAL_COMMUNITY)
Admission: RE | Admit: 2021-08-26 | Discharge: 2021-08-26 | Disposition: A | Discharge status: Home / Self Care | Payer: Medicare Other | Source: Ambulatory Visit | Attending: Gastroenterology | Admitting: Gastroenterology

## 2021-08-26 DIAGNOSIS — K6289 Other specified diseases of anus and rectum: Secondary | ICD-10-CM | POA: Insufficient documentation

## 2021-08-26 DIAGNOSIS — C210 Malignant neoplasm of anus, unspecified: Secondary | ICD-10-CM | POA: Diagnosis present

## 2021-08-26 IMAGING — CT CT CHEST W/O CM
2 of 6 series · 15 of 36 positions shown, 18 images · non-contrast
Comparison: None.

CLINICAL DATA: CT chest without contrast due to rectal mass
Malignant neoplasm of anus Dx'd [DATE] XRT upcoming Previous weight
loss smoke

EXAM:
CT CHEST WITHOUT CONTRAST
TECHNIQUE: Multidetector CT imaging of the chest was performed following the
standard protocol without IV contrast.

[Series 3: thins · axial · 0.93mm/px · z∈[-85,+251]mm · 12 of 796 slices shown, 15 images]
[im 62/796  mediastinal]
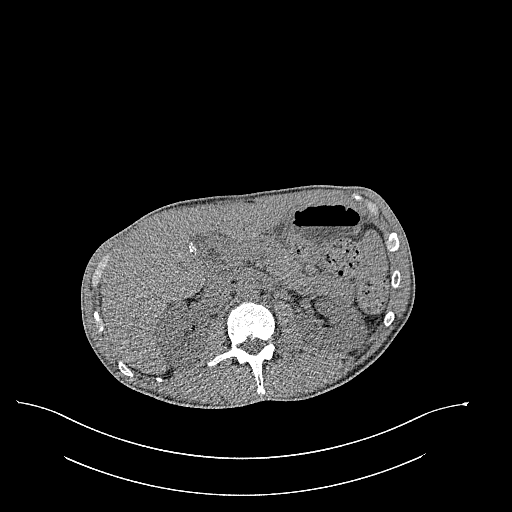
[im 62/796  lung]
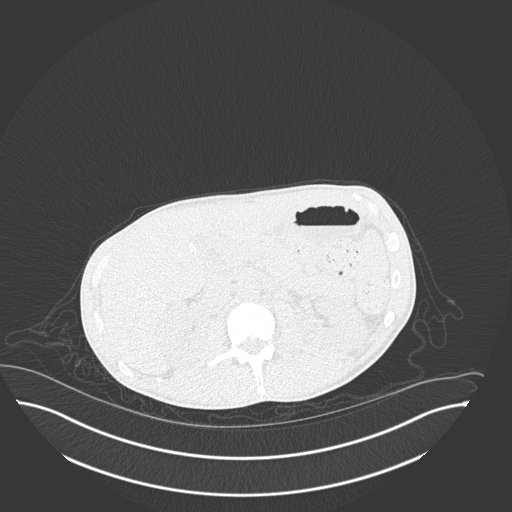
[im 123/796  lung]
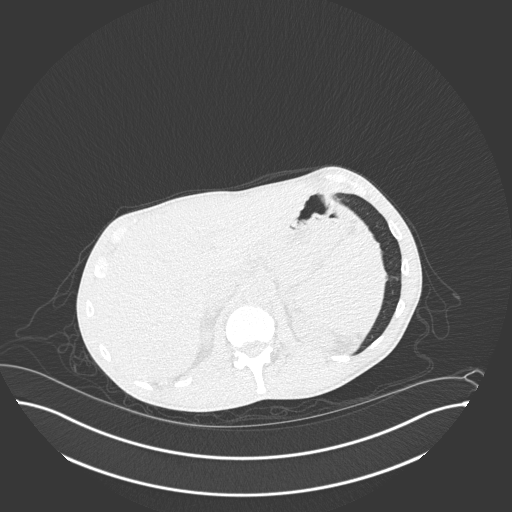
[im 184/796  lung]
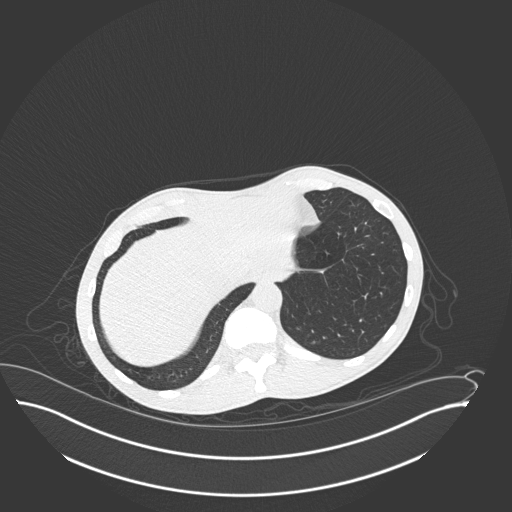
[im 245/796  lung]
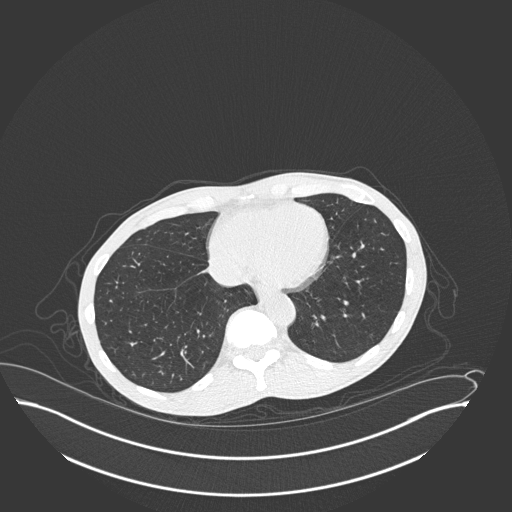
[im 306/796  mediastinal]
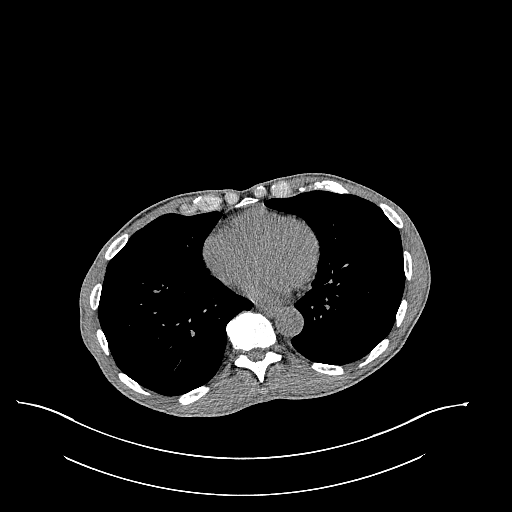
[im 306/796  lung]
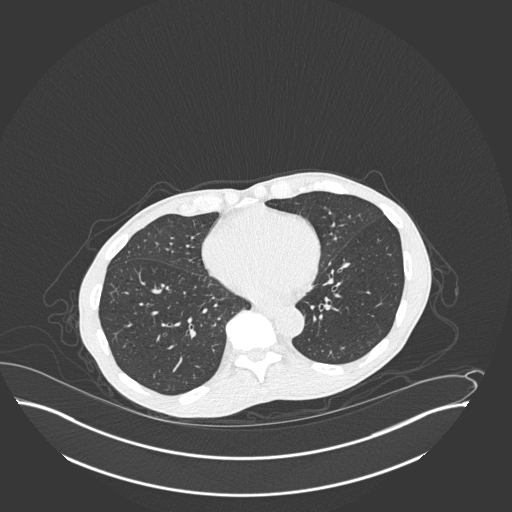
[im 367/796  lung]
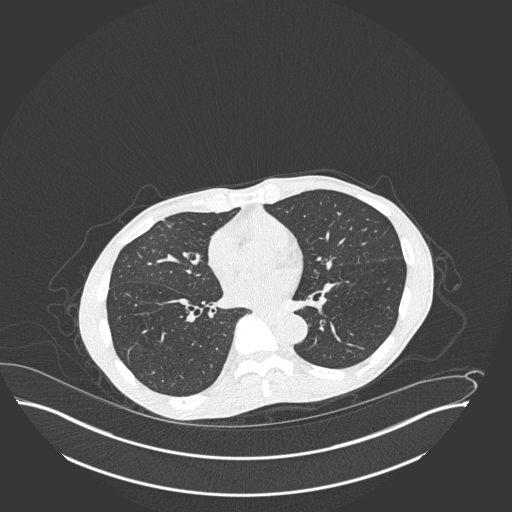
[im 429/796  lung]
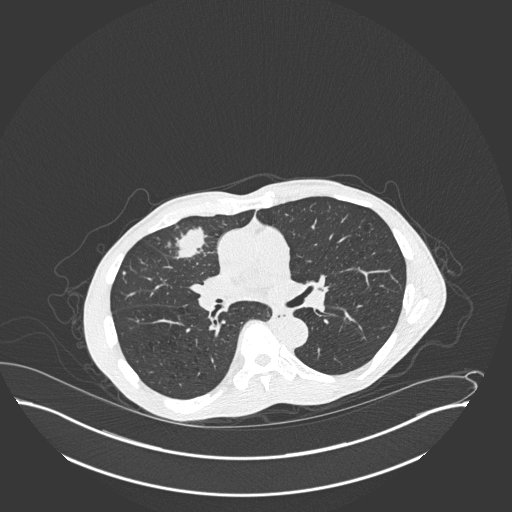
[im 490/796  lung]
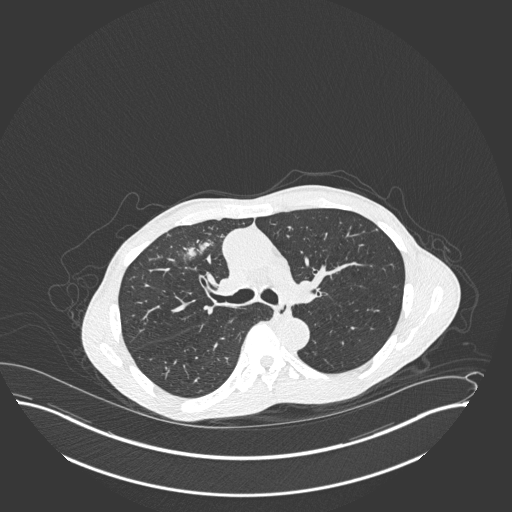
[im 551/796  mediastinal]
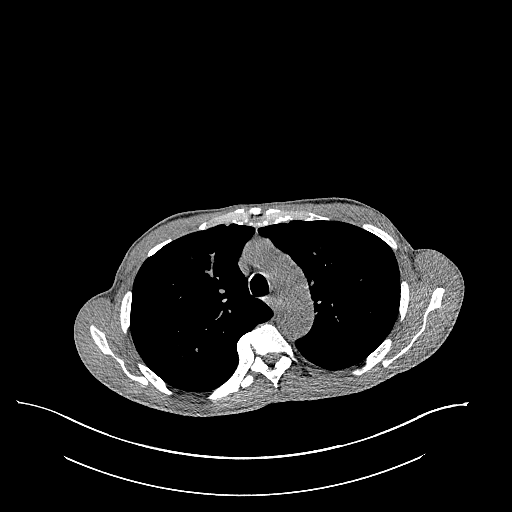
[im 551/796  lung]
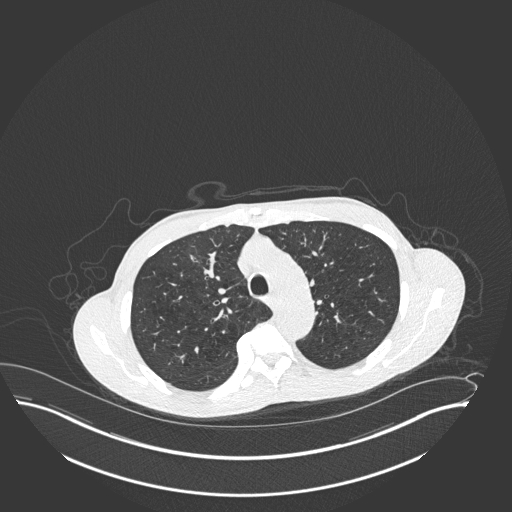
[im 612/796  lung]
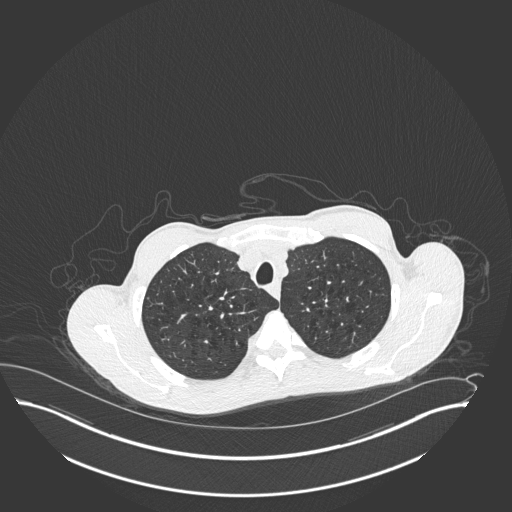
[im 673/796  lung]
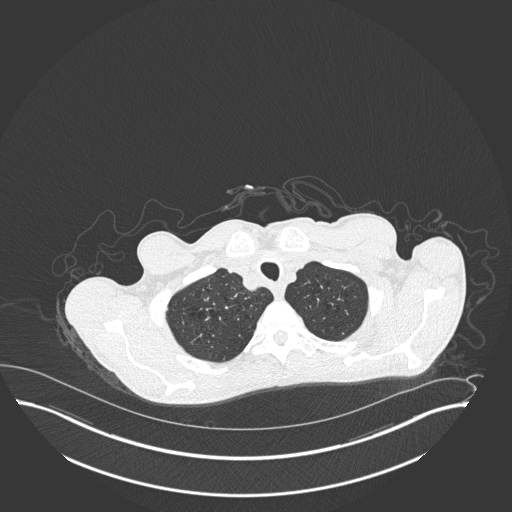
[im 734/796  lung]
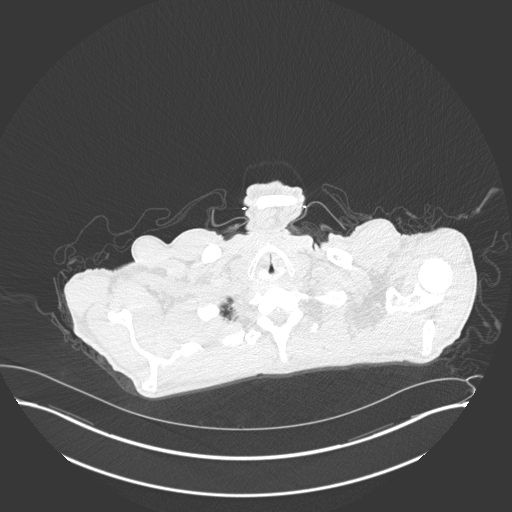

[Series 6: coronal · coronal · 0.78mm/px · 3 of 137 slices shown]
[im 28/137  lung]
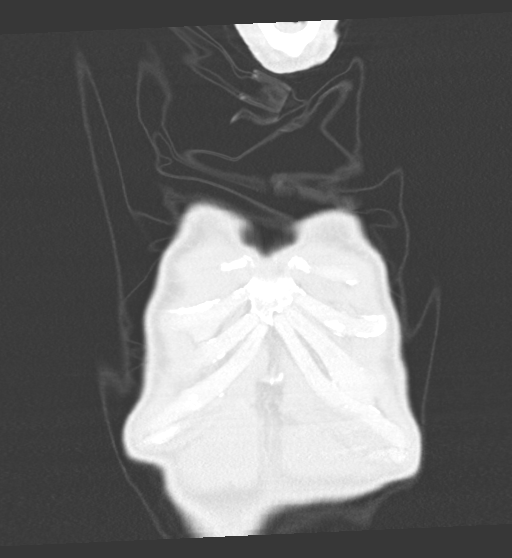
[im 55/137  lung]
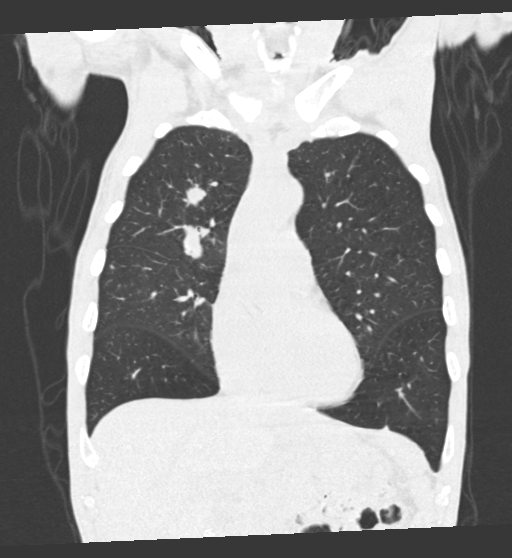
[im 82/137  lung]
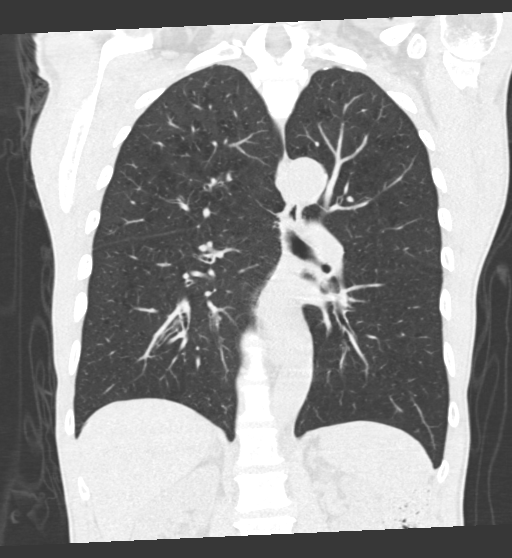

[15 of 36 positions shown; findings below may reference images not displayed]

FINDINGS: Cardiovascular: Normal heart size. No significant pericardial
effusion. The thoracic aorta is normal in caliber. At least left
anterior descending coronary artery calcifications.

Mediastinum/Nodes: No gross hilar adenopathy, noting limited
sensitivity for the detection of hilar adenopathy on this
noncontrast study. No enlarged mediastinal or axillary lymph nodes.
Thyroid gland, trachea, and esophagus demonstrate no significant
findings.

Lungs/Pleura:

Mild-to-moderate centrilobular emphysematous changes.

There is an irregular 2.2 x 1.9 cm right middle lobe pulmonary
nodule ([DATE]). There is an irregular right upper lobe 3.4 x 3.6 cm
pulmonary mass ([DATE]). Superior to this there is another irregular
1.8 x 1.5 cm pulmonary nodule ([DATE]). At the right apex anteriorly
there is a 0.8 x 0.5 cm pulmonary nodule ([DATE]). The is also a
cm right upper lobe pulmonary micronodule ([DATE]).

No definite pulmonary nodule within the left lung. No pulmonary mass
within the left lung.

No focal consolidation.

No pleural effusion.  No pneumothorax.

Upper Abdomen: Innumerable subcentimeter calcified stones within the
gallbladder lumen. Otherwise no acute abnormality.

Musculoskeletal:

No chest wall abnormality.

No suspicious lytic or blastic osseous lesions. No acute displaced
fracture.
IMPRESSION: 1. A 2.2 x 1.9 cm right middle lobe pulmonary nodule as well as a
total of four right upper lobe pulmonary nodule and masses measuring
up to 3.6 cm. Findings concerning for metastatic primary lung cancer
versus less likely metastases in a patient with rectal cancer.
Additional imaging evaluation or consultation with Pulmonology or
Thoracic Surgery recommended.
2. No gross hilar adenopathy, noting limited sensitivity for the
detection of hilar adenopathy on this noncontrast study.
3. Cholelithiasis.
4.  Emphysema ([0K]-[0K]).
5. At least left anterior descending coronary artery calcifications.

## 2021-08-26 MED ORDER — OXYCODONE HCL 5 MG PO TABS: 5.0000 mg | ORAL_TABLET | Freq: Three times a day (TID) | ORAL | 0 refills | Status: DC | PRN

## 2021-08-26 NOTE — Telephone Encounter (Signed)
Patty, I will give the patient 1 more round of medications to try and help get him through the next few days.  Moving forward he will have to ask his oncologist for any additional pain medications.  Thank you. GM

## 2021-08-27 ENCOUNTER — Ambulatory Visit (HOSPITAL_COMMUNITY)
Admission: RE | Admit: 2021-08-27 | Discharge: 2021-08-27 | Disposition: A | Discharge status: Home / Self Care | Payer: Medicare Other | Source: Ambulatory Visit | Attending: Gastroenterology | Admitting: Gastroenterology

## 2021-08-27 DIAGNOSIS — K6289 Other specified diseases of anus and rectum: Secondary | ICD-10-CM | POA: Insufficient documentation

## 2021-08-27 IMAGING — MR MR PELVIS W/O CM
5 of 6 series · 33 of 48 positions shown · non-contrast
Comparison: CT chest, abdomen and pelvis which was performed
[DATE].

CLINICAL DATA: Colorectal cancer staging in a 70-year-old male.

EXAM:
MRI PELVIS WITHOUT CONTRAST
TECHNIQUE: Multiplanar multisequence MR imaging of the pelvis was performed. No
intravenous contrast was administered. Ultrasound gel was
administered per rectum to optimize tumor evaluation.

[Series 3: T2 · sagittal · 3.0mm · 1.19mm/px · 7 of 44 slices shown (1 of 3)]
[im 1/44]
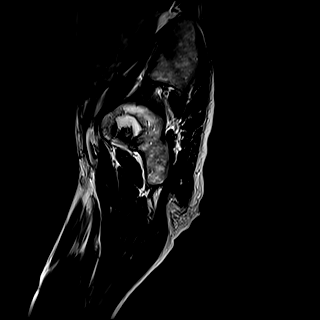
[im 8/44]
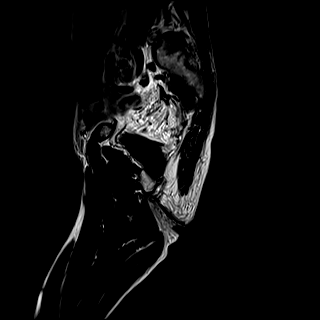
[im 15/44]
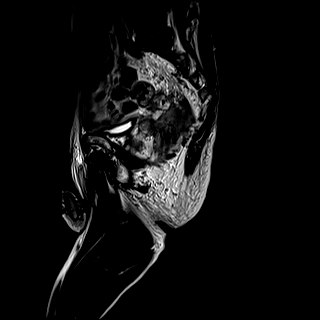
[im 22/44]
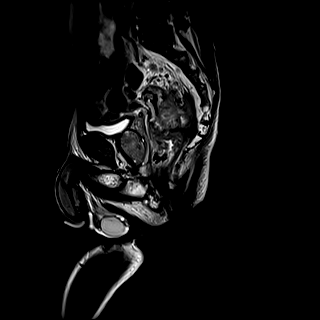
[im 29/44]
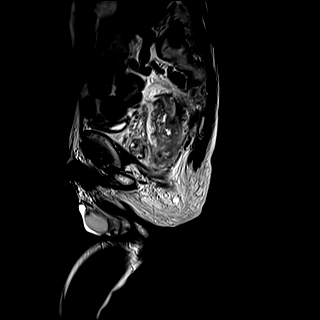
[im 36/44]
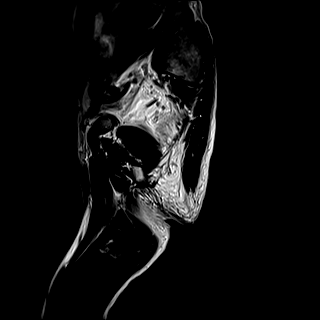
[im 44/44]
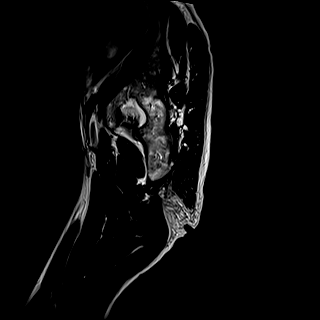

[Series 4: T2 · axial · 5.0mm · 0.59mm/px · z∈[-109,+113]mm · 6 of 37 slices shown (2 of 3)]
[im 1/37]
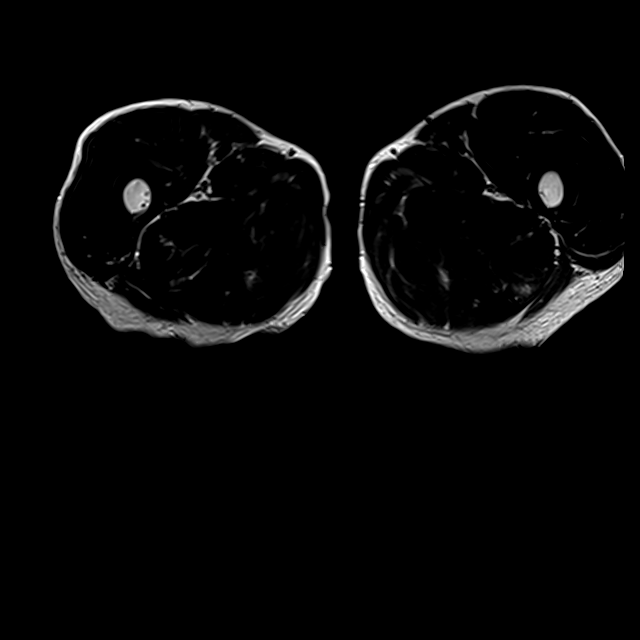
[im 8/37]
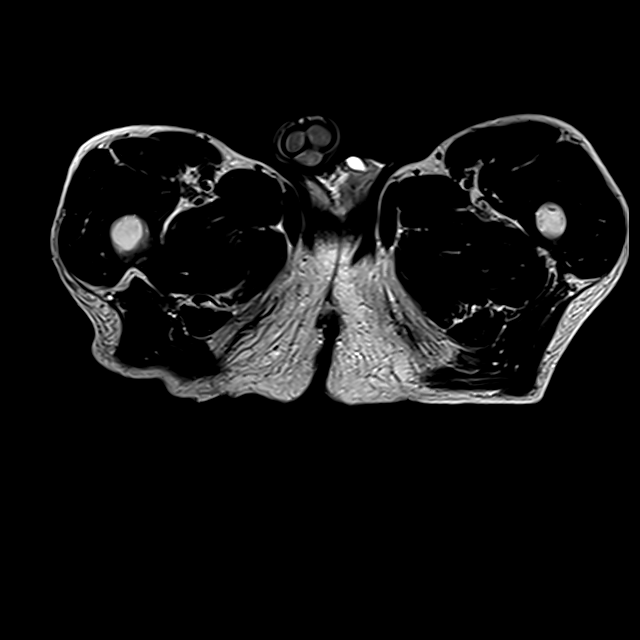
[im 15/37]
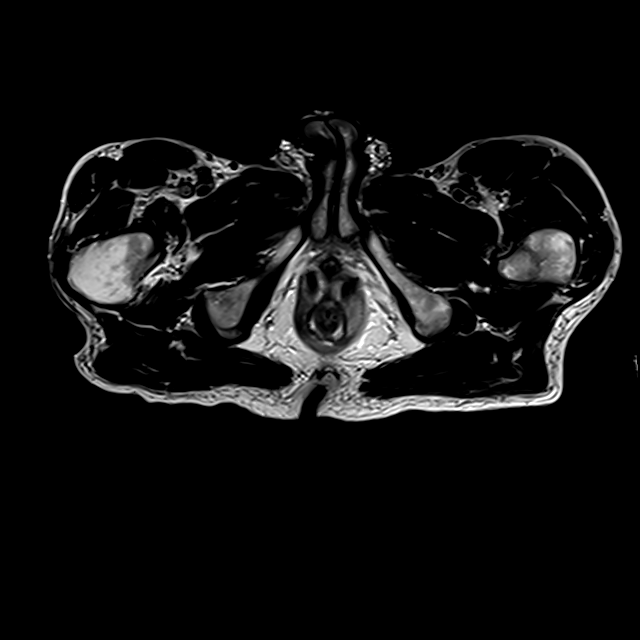
[im 22/37]
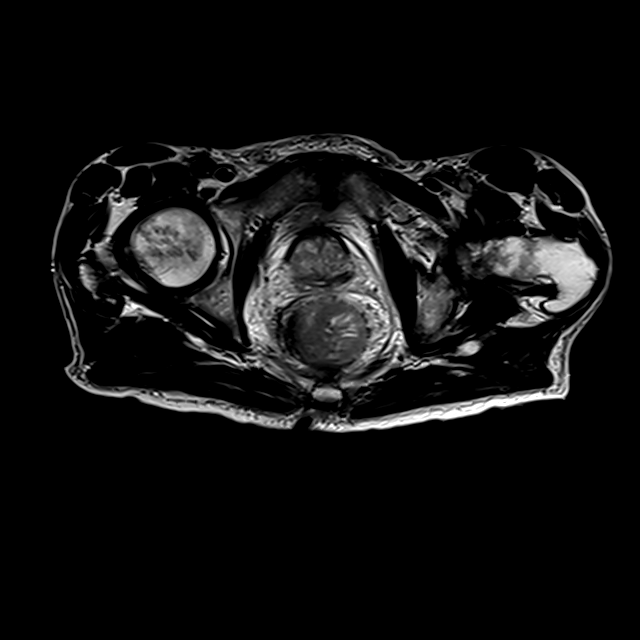
[im 29/37]
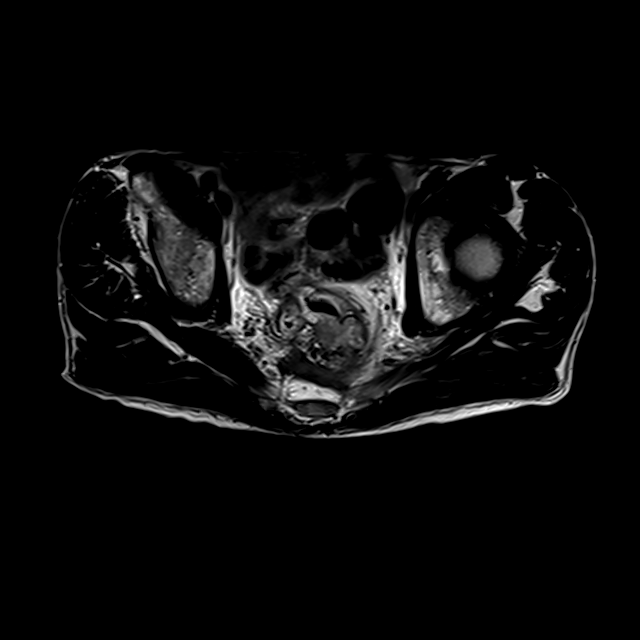
[im 37/37]
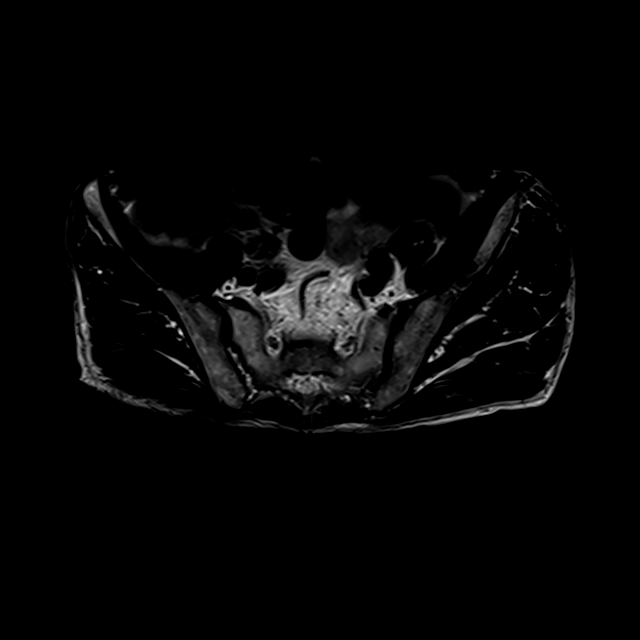

[Series 5: T2 · coronal · 3.0mm · 0.70mm/px · 8 of 52 slices shown (3 of 3)]
[im 1/52]
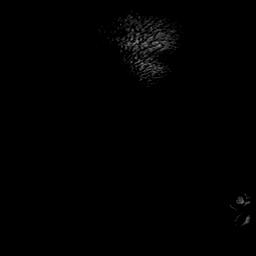
[im 8/52]
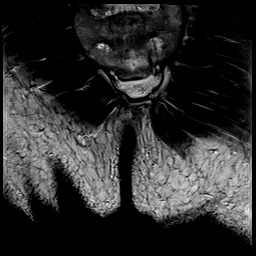
[im 15/52]
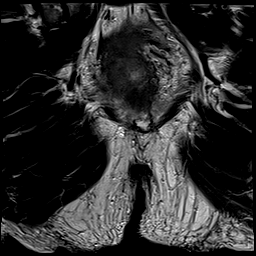
[im 22/52]
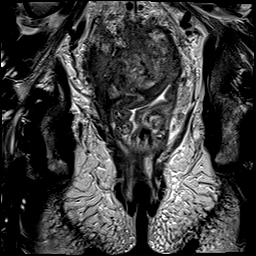
[im 30/52]
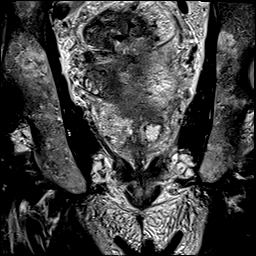
[im 37/52]
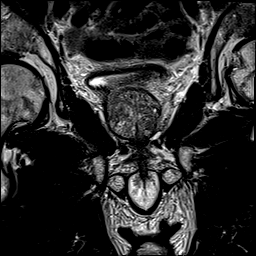
[im 44/52]
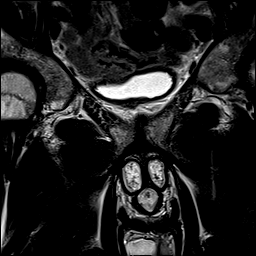
[im 52/52]
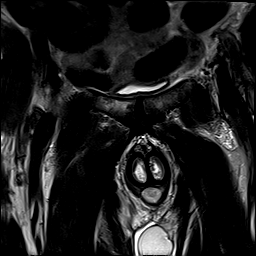

[Series 6: DWI · axial · 5.0mm · 1.48mm/px · z∈[-89,+121]mm · 11 of 72 slices shown (1 of 2)]
[im 1/72]
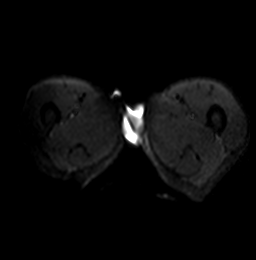
[im 8/72]
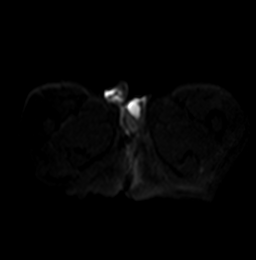
[im 15/72]
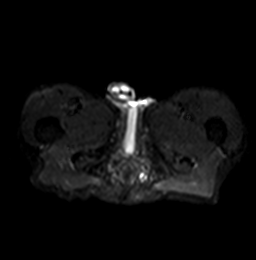
[im 22/72]
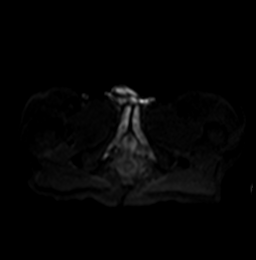
[im 29/72]
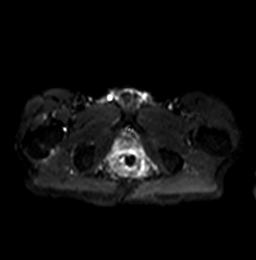
[im 36/72]
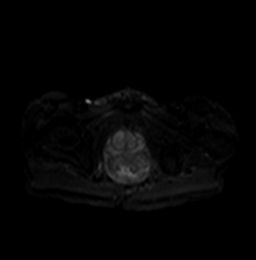
[im 43/72]
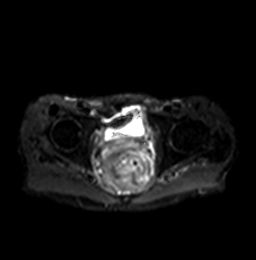
[im 50/72]
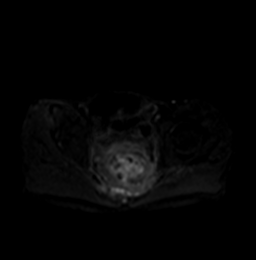
[im 57/72]
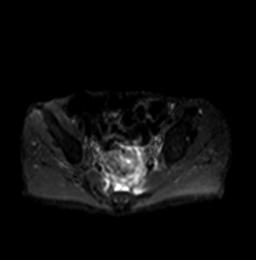
[im 64/72]
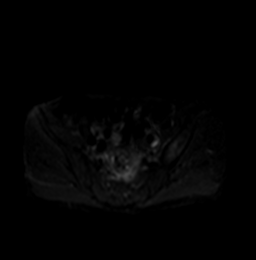
[im 72/72]
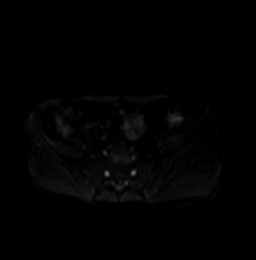

[Series 7: DWI · axial · 5.0mm · 1.48mm/px · 1 of 36 slices shown (2 of 2)]
[im 1/36]
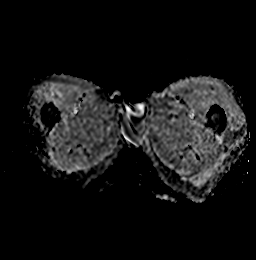

[33 of 48 positions shown; findings below may reference images not displayed]

FINDINGS: TUMOR LOCATION

Tumor distance from Anal Verge/Skin Surface:  5.2 cm

Tumor distance to Internal Anal Sphincter: 1.2 cm

TUMOR DESCRIPTION

Circumferential Extent: Circumferential involvement of the low
through the high rectum favoring the RIGHT posterolateral aspect of
the mid and high rectum.

Tumor Length: Approximately 8.1 cm. Some thickening extending into
the high rectum may be related to edema, above the irregular and
ulcerated appearing mass in the rectum with extension beyond the
rectum.

T - CATEGORY

Extension through Muscularis Propria: Yes

Shortest Distance of any tumor/node from Mesorectal Fascia: 0 mm

Extramural Vascular Invasion/Tumor Thrombus: Yes abnormal signal in
venous structures along the margins of the tumor illustrated on
image 25 of series 8 along the LEFT anterolateral margin of the
rectum.

Invasion of Anterior Peritoneal Reflection: Tumor may involve the
RIGHT anterolateral aspect of the mesorectal fascia and anterior
peritoneal reflection at its highest extent (image [DATE]) also seen
on image [DATE].

Involvement of Adjacent Organs or Pelvic Sidewall: Yes

RIGHT seminal vesicle with involvement with marked thickening of the
mesorectal fascia including extension of tumor into the presacral
space, all of these imaging findings can be illustrated on image
[DATE]. There is also abutment of the sacro tuberous ligament and
invasion of LEFT levator musculature as well as RIGHT levator
musculature.

Tumor extending posterior to the rectum beyond the rectal wall is
best illustrated on image [DATE]) this involves the posterior margin
of the mesorectum and extends inferiorly to involve the pelvic floor
in presacral space.

Levator Ani Involvement: Yes

N - CATEGORY

Mesorectal Lymph Nodes >=5mm: Greater than 4 abnormal lymph nodes in
the mesorectum (image [DATE]) 5 mm size.

Superior rectal lymph node (image [DATE]) 8 x 6 mm.

Additional high mesorectal lymph node (image [DATE]) 6 mm.

High superior rectal lymph node (image [DATE]) 1 cm lymph node.

Extra-mesorectal Lymphadenopathy: Small but suspicious lymph node
just outside the mesorectum (image [DATE]) 5-6 mm more rounded in low
signal on coronal images approximately 6 mm.

Other: Urinary bladder is under distended, no distal ureteral
dilation at this time. Tumor extends towards the RIGHT pelvic
sidewall along the RIGHT vas deferens but does not involve the RIGHT
pelvic sidewall. This may place the ureter at risk in the future,
ureter in very close proximity on image 14 of series 8 to the tumor
along the RIGHT mesorectum and seminal vesicle extending along the
RIGHT vas deferens.
IMPRESSION: Rectal adenocarcinoma T stage: T4 B

Rectal adenocarcinoma N stage: N2 disease likely associated with
early extra mesorectal lymph node involvement.

Distance from tumor to the internal anal sphincter is 1.2 cm.

Also with extramural venous involvement as described.

## 2021-08-29 NOTE — Progress Notes (Signed)
I spoke with Jorge [name] daughter, Jorge [name].  They are able to come tomorrwo at noon for a f/u appt with Dr Burr Medico instead of Wednesday at 1520.  Appt has been changed.

## 2021-08-30 ENCOUNTER — Inpatient Hospital Stay (HOSPITAL_BASED_OUTPATIENT_CLINIC_OR_DEPARTMENT_OTHER): Payer: Medicare Other | Admitting: Hematology

## 2021-08-30 ENCOUNTER — Telehealth: Payer: Self-pay | Admitting: Radiation Oncology

## 2021-08-30 ENCOUNTER — Other Ambulatory Visit: Payer: Self-pay

## 2021-08-30 VITALS — BP 142/74 | HR 88 | Temp 98.6°F | Resp 18 | Ht 72.0 in | Wt 144.5 lb

## 2021-08-30 DIAGNOSIS — C20 Malignant neoplasm of rectum: Secondary | ICD-10-CM

## 2021-08-30 DIAGNOSIS — M899 Disorder of bone, unspecified: Secondary | ICD-10-CM | POA: Insufficient documentation

## 2021-08-30 DIAGNOSIS — F17210 Nicotine dependence, cigarettes, uncomplicated: Secondary | ICD-10-CM | POA: Diagnosis not present

## 2021-08-30 DIAGNOSIS — R634 Abnormal weight loss: Secondary | ICD-10-CM | POA: Diagnosis not present

## 2021-08-30 DIAGNOSIS — D649 Anemia, unspecified: Secondary | ICD-10-CM | POA: Insufficient documentation

## 2021-08-30 DIAGNOSIS — G893 Neoplasm related pain (acute) (chronic): Secondary | ICD-10-CM | POA: Diagnosis not present

## 2021-08-30 DIAGNOSIS — K625 Hemorrhage of anus and rectum: Secondary | ICD-10-CM | POA: Insufficient documentation

## 2021-08-30 MED ORDER — MORPHINE SULFATE ER 15 MG PO TBCR: 15.0000 mg | EXTENDED_RELEASE_TABLET | Freq: Two times a day (BID) | ORAL | 0 refills | Status: DC

## 2021-08-30 MED ORDER — OXYCODONE HCL 5 MG PO TABS: 5.0000 mg | ORAL_TABLET | Freq: Three times a day (TID) | ORAL | 0 refills | Status: DC | PRN

## 2021-08-30 NOTE — Progress Notes (Signed)
[facility]   [phone] [phone]   Clinic Followup Note   Patient Care Team: Pcp, No as PCP - General  Date of Service:  08/30/2021   CHIEF COMPLAINTS Rectal pain   ASSESSMENT & PLAN:  Jorge [name] is a 70 y.o.  male who has not seen doctor for several years   1. 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 CT AP 07/23/21 showed rectal mass with no significant adenopathy or distant metastasis.. Colonoscopy 08/19/21 showed a 7 cm infiltrative mass in the mid rectum ( 6-13cm from anal verge), pathology showed adenocarcinoma. MMR is normal -I reviewed previous staging CT chest and pelvic MRI images with patient and his son today, and discussed the findings in detail. Unfortunately, he has at least 4 months in the right lung, highly suspicious for metastatic disease. -I have made urgent referral to pulmonologist Dr. Tonia Brooms for bronchoscopy biopsy, patient is scheduled to see them tomorrow.  I gave them his appointment time and location. -Patient is very symptomatic with rectal discomfort, I will make a urgent referral to Dr. Mitzi Hansen for radiation therapy. I may offer concurrent oral chemo Xeloda with radiation, depends on the duration of radiation. -I will see him before or at the beginning of his radiation.   2.  Multiple colon polyps -He has a large 3.5 cm polyps in the hepatic flexure, biopsy showed tubular adenoma.  Dr. Meridee Score plan to repeat a colonoscopy in 6 months and remove it -He had additional 4 small polyps which were removed, path showed tubular adenomas.  3.  Mild anemia from rectal bleeding, probable iron deficiency -He has started oral iron, tolerating well.  We will continue.  4.  Rectal pain -Secondary to rectal cancer, overall mild, he is on oxycodone as needed, usually takes at night, but he has been taking every 4 hours now -I will  call in MS Contin 15 mg twice daily, and refill his oxycodone today  5.  Weight loss and fatigue -Dietitian referral     PLAN:  -He is scheduled to see Dr. Tonia Brooms tomorrow to discuss bronchoscopy and biopsy, I told him the information about his appointment -I called in MS Contin 15 mg every 12 hours, and refilled oxycodone for him -urgent referral to Dr. Mitzi Hansen for radiation, may give Xeloda with radiation  -will request FO on his biopsy sample    Oncology History  Rectal cancer (HCC)  07/23/2021 Imaging   CT AP  IMPRESSION: 1. Appearance of the rectum likely indicates a rectal mass suspicious for rectal carcinoma. Inflammatory process would be less likely. Direct visualization is suggested. 2. Cholelithiasis without evidence of acute cholecystitis. 3. Aortic atherosclerosis.   08/19/2021 Procedure   Colonoscopy, under Dr. Meridee Score  Impression: - Rectal tenderness, palpable rectal mass and hemorrhoids found on digital rectal exam. - Stool in the entire examined colon - lavaged with still inadequate clearance. - One 35 mm polyp at the hepatic flexure. Biopsied. Tattooed distal in case future endoscopic resection is considered - however, has larger issues with rectal mass currently as below. - Four, 5 to 11 mm polyps in the transverse colon and at the hepatic flexure, removed with a cold snare. Resected and retrieved. - Diverticulosis in the recto-sigmoid colon and in the sigmoid colon. - Rule out malignancy, partially obstructing tumor in the rectum and from 6 to 13 cm proximal to the anus. Biopsied.   08/22/2021 Initial Diagnosis  Rectal cancer ([place])   08/29/2021 Cancer Staging   Staging form: Colon and Rectum, AJCC 8th Edition - Clinical stage from 08/29/2021: [name], cM1 - Signed by [name], MD on 08/29/2021 Stage prefix: Initial diagnosis       HISTORY OF PRESENTING ILLNESS:  Pt returns for follow-up, he presents to clinic with his son today.  He complains of  worsening rectal pain when he sits, she takes oxycodone 5 mg every 4-6 hours with good relief, but the pain returns in 2 to 3 hours.  The pain decreases when he walks or stands. He denies any other new pain, no cough or dyspnea, appetite moderate, weight is stable.   All other systems were reviewed with the patient and are negative.   MEDICAL HISTORY:  No past medical history on file.  SURGICAL HISTORY: Past Surgical History:  Procedure Laterality Date   left breast surgery Left 1968    SOCIAL HISTORY: Social History   Socioeconomic History   Marital status: Widowed    Spouse name: Not on file   Number of children: 5   Years of education: Not on file   Highest education level: Not on file  Occupational History   Not on file  Tobacco Use   Smoking status: Some Days    Packs/day: 0.25    Years: 20.00    Pack years: 5.00    Types: Cigarettes   Smokeless tobacco: Never  Substance and Sexual Activity   Alcohol use: Not Currently    Comment: 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, stopped in 02/2021   Drug use: Never   Sexual activity: Not on file  Other Topics Concern   Not on file  Social History Narrative   Not on file   Social Determinants of Health   Financial Resource Strain: Not on file  Food Insecurity: Not on file  Transportation Needs: Not on file  Physical Activity: Not on file  Stress: Not on file  Social Connections: Not on file  Intimate Partner Violence: Not on file    FAMILY HISTORY: Family History  Problem Relation Age of Onset   Cancer Mother 62       unknown type cancer   Colon cancer Neg Hx    Esophageal cancer Neg Hx    Rectal cancer Neg Hx    Stomach cancer Neg Hx     ALLERGIES:  has No Known Allergies.  MEDICATIONS:  Current Outpatient Medications  Medication Sig Dispense Refill   morphine (MS CONTIN) 15 MG 12 hr tablet Take 1 tablet (15 mg total) by mouth every 12 (twelve) hours. 30 tablet 0   hydrocortisone (ANUSOL-HC) 2.5 %  rectal cream Place 1 application rectally 2 (two) times daily. (Patient not taking: Reported on 08/23/2021) 30 g 0   oxyCODONE (ROXICODONE) 5 MG immediate release tablet Take 1 tablet (5 mg total) by mouth every 8 (eight) hours as needed for severe pain. 90 tablet 0   No current facility-administered medications for this visit.    PHYSICAL EXAMINATION: ECOG PERFORMANCE STATUS: 1 - Symptomatic but completely ambulatory  Vitals:   08/30/21 1129  BP: (!) 142/74  Pulse: 88  Resp: 18  Temp: 98.6 F (37 C)  SpO2: 100%   Filed Weights   08/30/21 1129  Weight: 144 lb 8 oz (65.5 kg)    GENERAL:alert, no distress and comfortable SKIN: skin color, texture, turgor are normal, no rashes or significant lesions EYES: normal, Conjunctiva are pink and non-injected, sclera clear  NEURO: alert & oriented x 3 with fluent speech, no focal motor/sensory deficits  LABORATORY DATA:  I have reviewed the data as listed CBC Latest Ref Rng & Units 08/11/2021 07/23/2021  WBC 4.0 - 10.5 K/uL 7.7 8.7  Hemoglobin 13.0 - 17.0 g/dL 12.3(L) 13.4  Hematocrit 39.0 - 52.0 % 38.0(L) 42.6  Platelets 150 - 400 K/uL 397 450(H)    CMP Latest Ref Rng & Units 08/11/2021 07/23/2021  Glucose 70 - 99 mg/dL 98 102(H)  BUN 8 - 23 mg/dL 13 15  Creatinine 0.61 - 1.24 mg/dL 0.71 0.74  Sodium 135 - 145 mmol/L 138 137  Potassium 3.5 - 5.1 mmol/L 3.9 4.1  Chloride 98 - 111 mmol/L 107 102  CO2 22 - 32 mmol/L 25 27  Calcium 8.9 - 10.3 mg/dL 9.0 9.2  Total Protein 6.5 - 8.1 g/dL 7.9 8.1  Total Bilirubin 0.3 - 1.2 mg/dL 0.7 0.4  Alkaline Phos 38 - 126 U/L 88 91  AST 15 - 41 U/L 18 25  ALT 0 - 44 U/L 14 24     RADIOGRAPHIC STUDIES: I have personally reviewed the radiological images as listed and agreed with the findings in the report. CT CHEST WO CONTRAST  Result Date: 08/26/2021 CLINICAL DATA:  CT chest without contrast due to rectal mass Malignant neoplasm of anus Dx'd 07/2021 XRT upcoming Previous weight loss smoke EXAM:  CT CHEST WITHOUT CONTRAST TECHNIQUE: Multidetector CT imaging of the chest was performed following the standard protocol without IV contrast. COMPARISON:  None. FINDINGS: Cardiovascular: Normal heart size. No significant pericardial effusion. The thoracic aorta is normal in caliber. At least left anterior descending coronary artery calcifications. Mediastinum/Nodes: No gross hilar adenopathy, noting limited sensitivity for the detection of hilar adenopathy on this noncontrast study. No enlarged mediastinal or axillary lymph nodes. Thyroid gland, trachea, and esophagus demonstrate no significant findings. Lungs/Pleura: Mild-to-moderate centrilobular emphysematous changes. There is an irregular 2.2 x 1.9 cm right middle lobe pulmonary nodule (5:114). There is an irregular right upper lobe 3.4 x 3.6 cm pulmonary mass (5:88). Superior to this there is another irregular 1.8 x 1.5 cm pulmonary nodule (5:66). At the right apex anteriorly there is a 0.8 x 0.5 cm pulmonary nodule (5:29). The is also a 0.3 cm right upper lobe pulmonary micronodule (5:93). No definite pulmonary nodule within the left lung. No pulmonary mass within the left lung. No focal consolidation. No pleural effusion.  No pneumothorax. Upper Abdomen: Innumerable subcentimeter calcified stones within the gallbladder lumen. Otherwise no acute abnormality. Musculoskeletal: No chest wall abnormality. No suspicious lytic or blastic osseous lesions. No acute displaced fracture. IMPRESSION: 1. A 2.2 x 1.9 cm right middle lobe pulmonary nodule as well as a total of four right upper lobe pulmonary nodule and masses measuring up to 3.6 cm. Findings concerning for metastatic primary lung cancer versus less likely metastases in a patient with rectal cancer. Additional imaging evaluation or consultation with Pulmonology or Thoracic Surgery recommended. 2. No gross hilar adenopathy, noting limited sensitivity for the detection of hilar adenopathy on this noncontrast  study. 3. Cholelithiasis. 4.  Emphysema (ICD10-J43.9). 5. At least left anterior descending coronary artery calcifications. Electronically Signed   By: Iven Finn M.D.   On: 08/26/2021 23:44   MR PELVIS WO CONTRAST  Result Date: 08/27/2021 CLINICAL DATA:  Colorectal cancer staging in a 70 year old male. EXAM: MRI PELVIS WITHOUT CONTRAST TECHNIQUE: Multiplanar multisequence MR imaging of the pelvis was performed. No intravenous contrast was administered. Ultrasound gel was administered per rectum  to optimize tumor evaluation. COMPARISON:  CT chest, abdomen and pelvis which was performed July 23, 2021. FINDINGS: TUMOR LOCATION Tumor distance from Anal Verge/Skin Surface:  5.2 cm Tumor distance to Internal Anal Sphincter: 1.2 cm TUMOR DESCRIPTION Circumferential Extent: Circumferential involvement of the low through the high rectum favoring the RIGHT posterolateral aspect of the mid and high rectum. Tumor Length: Approximately 8.1 cm. Some thickening extending into the high rectum may be related to edema, above the irregular and ulcerated appearing mass in the rectum with extension beyond the rectum. T - CATEGORY Extension through Muscularis Propria: Yes Shortest Distance of any tumor/node from Mesorectal Fascia: 0 mm Extramural Vascular Invasion/Tumor Thrombus: Yes abnormal signal in venous structures along the margins of the tumor illustrated on image 25 of series 8 along the LEFT anterolateral margin of the rectum. Invasion of Anterior Peritoneal Reflection: Tumor may involve the RIGHT anterolateral aspect of the mesorectal fascia and anterior peritoneal reflection at its highest extent (image 14/8) also seen on image 16/3. Involvement of Adjacent Organs or Pelvic Sidewall: Yes RIGHT seminal vesicle with involvement with marked thickening of the mesorectal fascia including extension of tumor into the presacral space, all of these imaging findings can be illustrated on image 21/8. There is also  abutment of the sacro tuberous ligament and invasion of LEFT levator musculature as well as RIGHT levator musculature. Tumor extending posterior to the rectum beyond the rectal wall is best illustrated on image 20/3) this involves the posterior margin of the mesorectum and extends inferiorly to involve the pelvic floor in presacral space. Levator Ani Involvement: Yes N - CATEGORY Mesorectal Lymph Nodes >=17mm: Greater than 4 abnormal lymph nodes in the mesorectum (image 17/4) 5 mm size. Superior rectal lymph node (image 19/5) 8 x 6 mm. Additional high mesorectal lymph node (image 23/5) 6 mm. High superior rectal lymph node (image 2/4) 1 cm lymph node. Extra-mesorectal Lymphadenopathy: Small but suspicious lymph node just outside the mesorectum (image 29/5) 5-6 mm more rounded in low signal on coronal images approximately 6 mm. Other: Urinary bladder is under distended, no distal ureteral dilation at this time. Tumor extends towards the RIGHT pelvic sidewall along the RIGHT vas deferens but does not involve the RIGHT pelvic sidewall. This may place the ureter at risk in the future, ureter in very close proximity on image 14 of series 8 to the tumor along the RIGHT mesorectum and seminal vesicle extending along the RIGHT vas deferens. IMPRESSION: Rectal adenocarcinoma T stage: T4 B Rectal adenocarcinoma N stage: N2 disease likely associated with early extra mesorectal lymph node involvement. Distance from tumor to the internal anal sphincter is 1.2 cm. Also with extramural venous involvement as described. Electronically Signed   By: Zetta Bills M.D.   On: 08/27/2021 16:17     Orders Placed This Encounter  Procedures   Ambulatory referral to Radiation Oncology    Referral Priority:   Urgent    Referral Type:   Consultation    Referral Reason:   Specialty Services Required    Requested Specialty:   Radiation Oncology    Number of Visits Requested:   1     All questions were answered. The patient knows to  call the clinic with any problems, questions or concerns. The total time spent in the appointment was 40 minutes.     [name], MD 08/30/2021 2:36 PM  I, Wilburn Mylar, am acting as scribe for [name], MD.   I have reviewed the above documentation for accuracy  and completeness, and I agree with the above.

## 2021-08-30 NOTE — Telephone Encounter (Signed)
I called the patient and his daughter but had to leave a voicemail to let him know that we had received a message about his imaging from [name]. Dr. [name] would like to still offer radiotherapy now with the plans of this being a palliative approach. He would offer radiation over 3 weeks. I'll place orders for simulation as well. [name] may also start Xeloda in the midst of radiotherapy.

## 2021-08-31 ENCOUNTER — Institutional Professional Consult (permissible substitution): Payer: Medicare Other | Admitting: Pulmonary Disease

## 2021-08-31 ENCOUNTER — Encounter: Payer: Self-pay | Admitting: Hematology

## 2021-08-31 ENCOUNTER — Inpatient Hospital Stay: Payer: Medicare Other | Admitting: Hematology

## 2021-08-31 ENCOUNTER — Other Ambulatory Visit: Payer: Self-pay

## 2021-08-31 NOTE — Progress Notes (Incomplete)
Synopsis: Referred in *** for *** by No ref. provider found  Subjective:   PATIENT ID: Jorge [name] GENDER: male DOB: Nov 16, 1950, MRN: [id number]  No chief complaint on file.   HPI  ***  No past medical history on file.   Family History  Problem Relation Age of Onset   Cancer Mother 56       unknown type cancer   Colon cancer Neg Hx    Esophageal cancer Neg Hx    Rectal cancer Neg Hx    Stomach cancer Neg Hx      Past Surgical History:  Procedure Laterality Date   left breast surgery Left 1968    Social History   Socioeconomic History   Marital status: Widowed    Spouse name: Not on file   Number of children: 5   Years of education: Not on file   Highest education level: Not on file  Occupational History   Not on file  Tobacco Use   Smoking status: Some Days    Packs/day: 0.25    Years: 20.00    Pack years: 5.00    Types: Cigarettes   Smokeless tobacco: Never  Substance and Sexual Activity   Alcohol use: Not Currently    Comment: 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, stopped in 02/2021   Drug use: Never   Sexual activity: Not on file  Other Topics Concern   Not on file  Social History Narrative   Not on file   Social Determinants of Health   Financial Resource Strain: Not on file  Food Insecurity: Not on file  Transportation Needs: Not on file  Physical Activity: Not on file  Stress: Not on file  Social Connections: Not on file  Intimate Partner Violence: Not on file     No Known Allergies   Outpatient Medications Prior to Visit  Medication Sig Dispense Refill   hydrocortisone (ANUSOL-HC) 2.5 % rectal cream Place 1 application rectally 2 (two) times daily. (Patient not taking: Reported on 08/23/2021) 30 g 0   morphine (MS CONTIN) 15 MG 12 hr tablet Take 1 tablet (15 mg total) by mouth every 12 (twelve) hours. 30 tablet 0   oxyCODONE (ROXICODONE) 5 MG immediate release tablet Take 1 tablet (5 mg total) by mouth  every 8 (eight) hours as needed for severe pain. 90 tablet 0   No facility-administered medications prior to visit.    ROS   Objective:  Physical Exam   There were no vitals filed for this visit.   on *** LPM *** RA BMI Readings from Last 3 Encounters:  08/30/21 19.60 kg/m  08/23/21 19.29 kg/m  08/23/21 19.35 kg/m   Wt Readings from Last 3 Encounters:  08/30/21 144 lb 8 oz (65.5 kg)  08/23/21 142 lb 3.2 oz (64.5 kg)  08/23/21 142 lb 11.2 oz (64.7 kg)     CBC    Component Value Date/Time   WBC 7.7 08/11/2021 1603   RBC 4.30 08/11/2021 1603   HGB 12.3 (L) 08/11/2021 1603   HCT 38.0 (L) 08/11/2021 1603   PLT 397 08/11/2021 1603   MCV 88.4 08/11/2021 1603   MCH 28.6 08/11/2021 1603   MCHC 32.4 08/11/2021 1603   RDW 14.6 08/11/2021 1603   LYMPHSABS 2.2 08/11/2021 1603   MONOABS 0.8 08/11/2021 1603   EOSABS 0.2 08/11/2021 1603   BASOSABS 0.1 08/11/2021 1603    ***  Chest Imaging: ***  Pulmonary Functions Testing Results: No flowsheet data found.  FeNO: ***  Pathology: ***  Echocardiogram: ***  Heart Catheterization: ***    Assessment & Plan:   No diagnosis found.  Discussion: ***   Current Outpatient Medications:    hydrocortisone (ANUSOL-HC) 2.5 % rectal cream, Place 1 application rectally 2 (two) times daily. (Patient not taking: Reported on 08/23/2021), Disp: 30 g, Rfl: 0   morphine (MS CONTIN) 15 MG 12 hr tablet, Take 1 tablet (15 mg total) by mouth every 12 (twelve) hours., Disp: 30 tablet, Rfl: 0   oxyCODONE (ROXICODONE) 5 MG immediate release tablet, Take 1 tablet (5 mg total) by mouth every 8 (eight) hours as needed for severe pain., Disp: 90 tablet, Rfl: 0  I spent *** minutes dedicated to the care of this patient on the date of this encounter to include pre-visit review of records, face-to-face time with the patient discussing conditions above, post visit ordering of testing, clinical documentation with the electronic health record,  making appropriate referrals as documented, and communicating necessary findings to members of the patients care team.   Garner Nash, DO [facility] 08/31/2021 8:12 AM

## 2021-08-31 NOTE — Progress Notes (Signed)
The proposed treatment discussed in conference is for discussion purpose only and is not a binding recommendation.  The patients have not been physically examined, or presented with their treatment options.  Therefore, final treatment plans cannot be decided.  

## 2021-09-01 ENCOUNTER — Encounter: Payer: Self-pay | Admitting: Nutrition

## 2021-09-01 ENCOUNTER — Telehealth: Payer: Self-pay | Admitting: Pharmacist

## 2021-09-01 ENCOUNTER — Other Ambulatory Visit (HOSPITAL_COMMUNITY): Payer: Self-pay

## 2021-09-01 ENCOUNTER — Encounter: Payer: Medicare Other | Admitting: Nutrition

## 2021-09-01 ENCOUNTER — Ambulatory Visit
Admission: RE | Admit: 2021-09-01 | Discharge: 2021-09-01 | Disposition: A | Discharge status: Home / Self Care | Payer: Medicare Other | Source: Ambulatory Visit | Attending: Radiation Oncology | Admitting: Radiation Oncology

## 2021-09-01 ENCOUNTER — Other Ambulatory Visit: Payer: Self-pay

## 2021-09-01 DIAGNOSIS — C20 Malignant neoplasm of rectum: Secondary | ICD-10-CM | POA: Insufficient documentation

## 2021-09-01 MED ORDER — CAPECITABINE 500 MG PO TABS
825.0000 mg/m2 | ORAL_TABLET | Freq: Two times a day (BID) | ORAL | 0 refills | Status: DC
  Filled 2021-09-01: qty 90, 15d supply, fill #0
  Filled 2021-09-02: qty 90, 21d supply, fill #0

## 2021-09-01 NOTE — Progress Notes (Signed)
Patient did not show up for nutrition appointment. 

## 2021-09-01 NOTE — Telephone Encounter (Addendum)
Oral Oncology Pharmacist Encounter  Received new prescription for Xeloda (capecitabine) for the treatment of rectal cancer in conjunction with radiation, planned duration 3 weeks.  CMP and CBC w/ Diff from 08/11/21 assessed, no relevant lab abnormalities noted. Prescription dose and frequency assessed for appropriateness.  Current medication list in Epic reviewed, no relevant/significant DDIs with Xeloda identified.  Evaluated chart and no patient barriers to medication adherence noted.   Prescription has been e-scribed to the [facility] for benefits analysis and approval. Patient's copay for Xeloda is $27.62.  Oral Oncology Clinic will continue to follow for initial counseling and start date.  Leron Croak, PharmD, BCPS Hematology/Oncology Clinical Pharmacist [facility] [place] [phone] 09/01/2021 4:23 PM

## 2021-09-01 NOTE — Addendum Note (Signed)
Addended by: [name] on: 09/01/2021 04:17 PM   Modules accepted: Orders

## 2021-09-02 ENCOUNTER — Telehealth: Payer: Self-pay

## 2021-09-02 ENCOUNTER — Other Ambulatory Visit (HOSPITAL_COMMUNITY): Payer: Self-pay

## 2021-09-02 ENCOUNTER — Other Ambulatory Visit: Payer: Self-pay

## 2021-09-02 DIAGNOSIS — C20 Malignant neoplasm of rectum: Secondary | ICD-10-CM

## 2021-09-02 NOTE — Telephone Encounter (Signed)
Oral Oncology Patient Advocate Encounter  After completing a benefits investigation, prior authorization for Xeloda is not required at this time through Medicare B.  Patient's copay is $27.62.    [place] [facility] [place] Phone [phone] Fax [phone] 09/02/2021 10:34 AM

## 2021-09-02 NOTE — Progress Notes (Signed)
I spoke with Mr Welton daughter, [name].  They are able to meet with Dr Burr Medico on 09/06/2021 at 1400.

## 2021-09-03 ENCOUNTER — Other Ambulatory Visit (HOSPITAL_COMMUNITY): Payer: Self-pay

## 2021-09-05 ENCOUNTER — Other Ambulatory Visit (HOSPITAL_COMMUNITY): Payer: Self-pay

## 2021-09-05 ENCOUNTER — Telehealth: Payer: Self-pay | Admitting: Hematology

## 2021-09-05 ENCOUNTER — Ambulatory Visit: Payer: Medicare Other | Admitting: Radiation Oncology

## 2021-09-05 DIAGNOSIS — C20 Malignant neoplasm of rectum: Secondary | ICD-10-CM | POA: Diagnosis not present

## 2021-09-05 NOTE — Telephone Encounter (Signed)
Oral Chemotherapy Pharmacist Encounter   Attempted to reach patient to provide update and offer for initial counseling on oral medication: Xeloda (capecitabine).   No answer. Unable to leave voicemail due to mailbox being full. Will attempt to call patient later today as well to discuss details of medication acquisition and initial counseling session.  Specialty Pharmacy Patient Advocate, Wynn Maudlin, spoke with patient on 09/02/21 regarding picking up medication at [facility] - patient was to pick up medication on Saturday, 09/03/21, but medication has not been picked up yet.   Leron Croak, PharmD, BCPS Hematology/Oncology Clinical Pharmacist [facility] [place] [phone] 09/05/2021 9:33 AM

## 2021-09-05 NOTE — Telephone Encounter (Signed)
Oral Chemotherapy Pharmacist Encounter  I spoke with patient's daughter, Jorge [name], for overview of: Xeloda for the treatment of rectal cancer in conjunction with radiation, planned duration 3 weeks.   Counseled on administration, dosing, side effects, monitoring, drug-food interactions, safe handling, storage, and disposal. Discussed safe handling of Xeloda with daughter since she will be the one helping patient with medications.    Patient will take Xeloda 500mg  tablets, 3 tablets (1500mg ) by mouth in AM and 3 tabs (1500mg ) by mouth in PM, within 30 minutes of finishing meals, on days of radiation only.  Xeloda and radiation start date: 09/06/21  Adverse effects of Xeloda include but are not limited to: fatigue, decreased blood counts, GI upset, diarrhea, mouth sores, and hand-foot syndrome.  Patient will obtain anti diarrheal and alert the office of 4 or more loose stools above baseline.   Reviewed importance of keeping a medication schedule and plan for any missed doses. No barriers to medication adherence identified.  Medication reconciliation performed and medication/allergy list updated.  Insurance authorization for Xeloda has been obtained. Test claim at the pharmacy revealed copayment $27.62 for 1st fill of Xeloda. Patient's daughter will pick this up from the [facility] on 09/05/21.  All questions answered.  Jorge [name] voiced understanding and appreciation.   Medication education handout placed at front desk of cancer center for patient to pick up 09/06/21. Patient and daughter know to call the office with questions or concerns. Oral Chemotherapy Clinic phone number provided.  Leron Croak, PharmD, BCPS Hematology/Oncology Clinical Pharmacist [facility] [place] [phone] 09/05/2021 12:13 PM

## 2021-09-05 NOTE — Telephone Encounter (Signed)
Rescheduled upcoming appointment due to provider's schedule. Patient's daughter is aware of changes.

## 2021-09-06 ENCOUNTER — Ambulatory Visit: Payer: Medicare Other

## 2021-09-06 ENCOUNTER — Ambulatory Visit: Payer: Medicare Other | Admitting: Hematology

## 2021-09-06 ENCOUNTER — Encounter: Payer: Self-pay | Admitting: Hematology

## 2021-09-06 ENCOUNTER — Other Ambulatory Visit: Payer: Medicare Other

## 2021-09-06 ENCOUNTER — Inpatient Hospital Stay: Payer: Medicare Other

## 2021-09-06 ENCOUNTER — Inpatient Hospital Stay (HOSPITAL_BASED_OUTPATIENT_CLINIC_OR_DEPARTMENT_OTHER): Payer: Medicare Other | Admitting: Hematology

## 2021-09-06 ENCOUNTER — Ambulatory Visit
Admission: RE | Admit: 2021-09-06 | Discharge: 2021-09-06 | Disposition: A | Discharge status: Home / Self Care | Payer: Medicare Other | Source: Ambulatory Visit | Attending: Radiation Oncology | Admitting: Radiation Oncology

## 2021-09-06 ENCOUNTER — Other Ambulatory Visit (HOSPITAL_COMMUNITY): Payer: Self-pay

## 2021-09-06 ENCOUNTER — Other Ambulatory Visit: Payer: Self-pay

## 2021-09-06 VITALS — BP 172/76 | HR 86 | Temp 98.6°F | Resp 17 | Wt 142.6 lb

## 2021-09-06 DIAGNOSIS — C20 Malignant neoplasm of rectum: Secondary | ICD-10-CM | POA: Diagnosis present

## 2021-09-06 DIAGNOSIS — C775 Secondary and unspecified malignant neoplasm of intrapelvic lymph nodes: Secondary | ICD-10-CM | POA: Insufficient documentation

## 2021-09-06 DIAGNOSIS — R634 Abnormal weight loss: Secondary | ICD-10-CM | POA: Insufficient documentation

## 2021-09-06 DIAGNOSIS — G893 Neoplasm related pain (acute) (chronic): Secondary | ICD-10-CM | POA: Insufficient documentation

## 2021-09-06 DIAGNOSIS — D500 Iron deficiency anemia secondary to blood loss (chronic): Secondary | ICD-10-CM | POA: Insufficient documentation

## 2021-09-06 DIAGNOSIS — D123 Benign neoplasm of transverse colon: Secondary | ICD-10-CM | POA: Insufficient documentation

## 2021-09-06 LAB — CBC WITH DIFFERENTIAL (CANCER CENTER ONLY)
Abs Immature Granulocytes: 0.03 10*3/uL (ref 0.00–0.07)
Basophils Absolute: 0 10*3/uL (ref 0.0–0.1)
Basophils Relative: 1 %
Eosinophils Absolute: 0.1 10*3/uL (ref 0.0–0.5)
Eosinophils Relative: 2 %
HCT: 38.2 % — ABNORMAL LOW (ref 39.0–52.0)
Hemoglobin: 12.2 g/dL — ABNORMAL LOW (ref 13.0–17.0)
Immature Granulocytes: 0 %
Lymphocytes Relative: 22 %
Lymphs Abs: 1.6 10*3/uL (ref 0.7–4.0)
MCH: 27.5 pg (ref 26.0–34.0)
MCHC: 31.9 g/dL (ref 30.0–36.0)
MCV: 86 fL (ref 80.0–100.0)
Monocytes Absolute: 0.9 10*3/uL (ref 0.1–1.0)
Monocytes Relative: 12 %
Neutro Abs: 4.9 10*3/uL (ref 1.7–7.7)
Neutrophils Relative %: 63 %
Platelet Count: 459 10*3/uL — ABNORMAL HIGH (ref 150–400)
RBC: 4.44 MIL/uL (ref 4.22–5.81)
RDW: 14 % (ref 11.5–15.5)
WBC Count: 7.6 10*3/uL (ref 4.0–10.5)
nRBC: 0 % (ref 0.0–0.2)

## 2021-09-06 LAB — CMP (CANCER CENTER ONLY)
ALT: 14 U/L (ref 0–44)
AST: 18 U/L (ref 15–41)
Albumin: 3.1 g/dL — ABNORMAL LOW (ref 3.5–5.0)
Alkaline Phosphatase: 103 U/L (ref 38–126)
Anion gap: 8 (ref 5–15)
BUN: 9 mg/dL (ref 8–23)
CO2: 28 mmol/L (ref 22–32)
Calcium: 8.9 mg/dL (ref 8.9–10.3)
Chloride: 101 mmol/L (ref 98–111)
Creatinine: 0.83 mg/dL (ref 0.61–1.24)
GFR, Estimated: >60 mL/min (ref >60)
Glucose, Bld: 102 mg/dL — ABNORMAL HIGH (ref 70–99)
Potassium: 4.1 mmol/L (ref 3.5–5.1)
Sodium: 137 mmol/L (ref 135–145)
Total Bilirubin: 0.4 mg/dL (ref 0.3–1.2)
Total Protein: 8.1 g/dL (ref 6.5–8.1)

## 2021-09-06 LAB — CEA (IN HOUSE-CHCC): CEA (CHCC-In House): 571.84 ng/mL — ABNORMAL HIGH (ref 0.00–5.00)

## 2021-09-06 NOTE — Progress Notes (Signed)
I had called the patient's daughter last week to update the change in plans given the findings of metastatic disease in the lungs to a palliative course of radiotherapy.  I also updated the consent forms last week in our department.  I met the patient back at the treatment machine to make sure that he had heard this from [place] as well.  He is aware that we will move to a 3-week course of radiotherapy and [name] plans to continue with Xeloda during this treatment.  I encouraged him to have his daughter call us back if she had questions specifically about these changes.

## 2021-09-06 NOTE — Progress Notes (Signed)
[facility]   [phone] [phone]   Clinic Follow up Note   Patient Care Team: Pcp, No as PCP - General  Date of Service:  09/06/2021  CHIEF COMPLAINT: f/u of rectal cancer  CURRENT THERAPY:  Concurrent chemoRT with Xeloda, starting 09/06/21  ASSESSMENT & PLAN:  Jorge [name] is a 70 y.o. male with   1. 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 CT AP 07/23/21 showed rectal mass with no significant adenopathy or distant metastasis. Colonoscopy 08/19/21 showed a 7 cm infiltrative mass in the mid rectum (6-13cm from anal verge), pathology showed adenocarcinoma. MMR is normal -staging pelvic MRI 08/27/21 showed: staging at T4b; N2 likely associated with early extra mesorectal lymph node involvement; distance from tumor to internal anal sphincter is 1.2 cm; also with extramural venous involvement. -he is scheduled to begin concurrent chemoRT with Xeloda today, 09/06/21. He states he took his first dose of Xeloda this morning. -he will proceed to lab this morning, then return later this morning for his first radiation treatment.  2. Symptom Management: Rectal pain, weight loss, fatigue -Secondary to rectal cancer  -he is on oxycodone every 4 hours now, controlled. -I called in [place], but he reports he was unsure how to time it. I wrote down further instructions for him and his daughter. -Dietitian referral; he did not show to 09/01/21 appointment   3. Mild anemia from rectal bleeding, probable iron deficiency -He has started oral iron, tolerating well.  We will continue.   4. Pulmonary nodules -seen on chest CT 08/26/21 showing 2.2 cm RML nodule and four RUL nodules/masses measuring up to 3.6 cm. Findings concerning for metastatic primary lung cancer. -he is scheduled to meet Dr. Valeta Harms on 09/14/21  5. Multiple colon polyps -He has a large 3.5 cm polyps  in the hepatic flexure, biopsy showed tubular adenoma.  Dr. [name] plan to repeat a colonoscopy in 6 months and remove it -He had additional 4 small polyps which were removed, path showed tubular adenomas.     PLAN:  -proceed to lab today -return for radiation therapy later this morning -continue radiation and Xeloda M-F -lab and f/u in 7-10 week -consult with Dr. Valeta Harms on 09/14/21 for bronchoscopy and biopsy    No problem-specific Assessment & Plan notes found for this encounter.   SUMMARY OF ONCOLOGIC HISTORY: Oncology History Overview Note  Cancer Staging Rectal cancer [facility]) Staging form: Colon and Rectum, AJCC 8th Edition - Clinical stage from 08/29/2021: [name], cM1 - Signed by [name], MD on 08/29/2021    Rectal cancer ([place])  07/23/2021 Imaging   CT AP  IMPRESSION: 1. Appearance of the rectum likely indicates a rectal mass suspicious for rectal carcinoma. Inflammatory process would be less likely. Direct visualization is suggested. 2. Cholelithiasis without evidence of acute cholecystitis. 3. Aortic atherosclerosis.   08/19/2021 Procedure   Colonoscopy, under Dr. [name]  Impression: - Rectal tenderness, palpable rectal mass and hemorrhoids found on digital rectal exam. - Stool in the entire examined colon - lavaged with still inadequate clearance. - One 35 mm polyp at the hepatic flexure. Biopsied. Tattooed distal in case future endoscopic resection is considered - however, has larger issues with rectal mass currently as below. - Four, 5 to 11 mm polyps in the transverse colon and at the hepatic flexure, removed with a cold snare. Resected and retrieved. - Diverticulosis in the recto-sigmoid colon and in the sigmoid  colon. - Rule out malignancy, partially obstructing tumor in the rectum and from 6 to 13 cm proximal to the anus. Biopsied.   08/19/2021 Pathology Results   Diagnosis 1. Hepatic Flexure Biopsy - TUBULAR ADENOMA WITHOUT HIGH-GRADE DYSPLASIA  OR MALIGNANCY 2. Transverse Colon Biopsy, and hepatic flexure, polyps (4) - TUBULAR ADENOMA WITHOUT HIGH-GRADE DYSPLASIA OR MALIGNANCY - OTHER FRAGMENTS OF POLYPOID COLONIC MUCOSA WITH NO SPECIFIC HISTOPATHOLOGIC CHANGES - FOOD MATERIAL 3. Rectum, biopsy - ADENOCARCINOMA. SEE NOTE   08/22/2021 Initial Diagnosis   Rectal cancer ([place])   08/26/2021 Imaging   IMPRESSION: 1. A 2.2 x 1.9 cm right middle lobe pulmonary nodule as well as a total of four right upper lobe pulmonary nodule and masses measuring up to 3.6 cm. Findings concerning for metastatic primary lung cancer versus less likely metastases in a patient with rectal cancer. Additional imaging evaluation or consultation with Pulmonology or Thoracic Surgery recommended. 2. No gross hilar adenopathy, noting limited sensitivity for the detection of hilar adenopathy on this noncontrast study. 3. Cholelithiasis. 4.  Emphysema (ICD10-J43.9). 5. At least left anterior descending coronary artery calcifications.   08/27/2021 Imaging   IMPRESSION: Rectal adenocarcinoma T stage: T4 B   Rectal adenocarcinoma N stage: N2 disease likely associated with early extra mesorectal lymph node involvement.   Distance from tumor to the internal anal sphincter is 1.2 cm.   Also with extramural venous involvement as described.   08/29/2021 Cancer Staging   Staging form: Colon and Rectum, AJCC 8th Edition - Clinical stage from 08/29/2021: [name], cM1 - Signed by [name], MD on 08/29/2021 Stage prefix: Initial diagnosis       INTERVAL HISTORY:  Jorge [name] is here for a follow up of rectal cancer. He was last seen by me on 08/30/21. He presents to the clinic alone. His daughter brought him today. He reports his pain is controlled on the oxycodone. He notes he has 5 children and 29 grandchildren (19 of which live here in [place]). He also notes several great-grandchildren that he was previously helping care for. He expressed concern  about being able to care for them with his diagnosis.   All other systems were reviewed with the patient and are negative.  MEDICAL HISTORY:  Past Medical History:  Diagnosis Date   Rectal adenocarcinoma metastatic to intrapelvic lymph node ([place]) 08/19/2021    SURGICAL HISTORY: Past Surgical History:  Procedure Laterality Date   left breast surgery Left 1968    I have reviewed the social history and family history with the patient and they are unchanged from previous note.  ALLERGIES:  has No Known Allergies.  MEDICATIONS:  Current Outpatient Medications  Medication Sig Dispense Refill   capecitabine (XELODA) 500 MG tablet Take 3 tablets (1,500 mg total) by mouth 2 (two) times daily after a meal. Take with concurrent radiation, Monday through Friday, off on weekends 90 tablet 0   hydrocortisone (ANUSOL-HC) 2.5 % rectal cream Place 1 application rectally 2 (two) times daily. (Patient not taking: Reported on 08/23/2021) 30 g 0   morphine (MS CONTIN) 15 MG 12 hr tablet Take 1 tablet (15 mg total) by mouth every 12 (twelve) hours. 30 tablet 0   oxyCODONE (ROXICODONE) 5 MG immediate release tablet Take 1 tablet (5 mg total) by mouth every 8 (eight) hours as needed for severe pain. 90 tablet 0   No current facility-administered medications for this visit.    PHYSICAL EXAMINATION: ECOG PERFORMANCE STATUS: 1 - Symptomatic but completely ambulatory  Vitals:  09/06/21 0840  BP: (!) 172/76  Pulse: 86  Resp: 17  Temp: 98.6 F (37 C)  SpO2: 100%   Wt Readings from Last 3 Encounters:  09/06/21 142 lb 9.6 oz (64.7 kg)  08/30/21 144 lb 8 oz (65.5 kg)  08/23/21 142 lb 3.2 oz (64.5 kg)     GENERAL:alert, no distress and comfortable SKIN: skin color normal, no rashes or significant lesions EYES: normal, Conjunctiva are pink and non-injected, sclera clear  NEURO: alert & oriented x 3 with fluent speech  LABORATORY DATA:  I have reviewed the data as listed CBC Latest Ref Rng &  Units 09/06/2021 08/11/2021 07/23/2021  WBC 4.0 - 10.5 K/uL 7.6 7.7 8.7  Hemoglobin 13.0 - 17.0 g/dL 12.2(L) 12.3(L) 13.4  Hematocrit 39.0 - 52.0 % 38.2(L) 38.0(L) 42.6  Platelets 150 - 400 K/uL 459(H) 397 450(H)     CMP Latest Ref Rng & Units 09/06/2021 08/11/2021 07/23/2021  Glucose 70 - 99 mg/dL 102(H) 98 102(H)  BUN 8 - 23 mg/dL _0 Creatinine 0.61 - 1.24 mg/dL 0.83 0.71 0.74  Sodium 135 - 145 mmol/L 137 138 137  Potassium 3.5 - 5.1 mmol/L 4.1 3.9 4.1  Chloride 98 - 111 mmol/L 101 107 102  CO2 22 - 32 mmol/L _1 Calcium 8.9 - 10.3 mg/dL 8.9 9.0 9.2  Total Protein 6.5 - 8.1 g/dL 8.1 7.9 8.1  Total Bilirubin 0.3 - 1.2 mg/dL 0.4 0.7 0.4  Alkaline Phos 38 - 126 U/L 103 88 91  AST 15 - 41 U/L _2 ALT 0 - 44 U/L _3 RADIOGRAPHIC STUDIES: I have personally reviewed the radiological images as listed and agreed with the findings in the report. No results found.    Orders Placed This Encounter  Procedures   CEA (IN HOUSE-[facility])    Standing Status:   Standing    Number of Occurrences:   15    Standing Expiration Date:   09/06/2022   All questions were answered. The patient knows to call the clinic with any problems, questions or concerns. No barriers to learning was detected. The total time spent in the appointment was 25 minutes.     [name], MD 09/06/2021   I, Wilburn Mylar, am acting as scribe for [name], MD.   I have reviewed the above documentation for accuracy and completeness, and I agree with the above.

## 2021-09-07 ENCOUNTER — Ambulatory Visit
Admission: RE | Admit: 2021-09-07 | Discharge: 2021-09-07 | Disposition: A | Discharge status: Home / Self Care | Payer: Medicare Other | Source: Ambulatory Visit | Attending: Radiation Oncology | Admitting: Radiation Oncology

## 2021-09-07 DIAGNOSIS — C20 Malignant neoplasm of rectum: Secondary | ICD-10-CM | POA: Diagnosis not present

## 2021-09-08 ENCOUNTER — Ambulatory Visit
Admission: RE | Admit: 2021-09-08 | Discharge: 2021-09-08 | Disposition: A | Discharge status: Home / Self Care | Payer: Medicare Other | Source: Ambulatory Visit | Attending: Radiation Oncology | Admitting: Radiation Oncology

## 2021-09-08 ENCOUNTER — Other Ambulatory Visit: Payer: Self-pay

## 2021-09-08 DIAGNOSIS — C20 Malignant neoplasm of rectum: Secondary | ICD-10-CM | POA: Diagnosis not present

## 2021-09-09 ENCOUNTER — Other Ambulatory Visit: Payer: Self-pay

## 2021-09-09 ENCOUNTER — Ambulatory Visit
Admission: RE | Admit: 2021-09-09 | Discharge: 2021-09-09 | Disposition: A | Discharge status: Home / Self Care | Payer: Medicare Other | Source: Ambulatory Visit | Attending: Radiation Oncology | Admitting: Radiation Oncology

## 2021-09-09 ENCOUNTER — Encounter: Payer: Self-pay | Admitting: Hematology

## 2021-09-09 ENCOUNTER — Other Ambulatory Visit: Payer: Self-pay | Admitting: Hematology

## 2021-09-09 ENCOUNTER — Telehealth: Payer: Self-pay

## 2021-09-09 DIAGNOSIS — C20 Malignant neoplasm of rectum: Secondary | ICD-10-CM | POA: Diagnosis not present

## 2021-09-09 MED ORDER — OXYCODONE HCL 10 MG PO TABS: 10.0000 mg | ORAL_TABLET | ORAL | 0 refills | Status: DC | PRN

## 2021-09-09 MED ORDER — PROCHLORPERAZINE MALEATE 10 MG PO TABS: 10.0000 mg | ORAL_TABLET | Freq: Four times a day (QID) | ORAL | 0 refills | Status: DC | PRN

## 2021-09-09 MED ORDER — ONDANSETRON HCL 8 MG PO TABS: 8.0000 mg | ORAL_TABLET | Freq: Three times a day (TID) | ORAL | 0 refills | Status: DC | PRN

## 2021-09-09 NOTE — Telephone Encounter (Signed)
Pt sent a MyChart message to staff regarding a refill of his oxycodone and asking questions about his other medications. This RN spoke with both the pt and their daughter [name] (see contact info), pt reported taking 83 oxycodone pills in 10 days d/t pain (about 1 every 3hrs per pt). Pt daughter also reports significant amount of diarrhea from pt on a daily basis, for which he had started imodium. Dr.Feng made aware and recommended and ordered the following: 10mg  oxycodone every 8 hrs, 15mg  morphine 12hr tablet, as well as compazine and Zofran to take with the morphine. Pt daughter was educated on how to correctly take these medication and daughter verbalized understanding, saying she lives with pt and is able to help with medication timing and administration. Per Dr.Feng pt is to continue with imodium, up to 8 tablets a day. Pt and daughter also instructed to not take xeloda pills over the weekend, per Dr.Feng. Pt and pt daughter had no further questions at this time and were sent to scheduling to schedule their next appointment for the following week.

## 2021-09-09 NOTE — Telephone Encounter (Signed)
Maygan Rollene Rotunda, RN called to assess pt for Oxycodone refill.

## 2021-09-12 ENCOUNTER — Telehealth: Payer: Self-pay | Admitting: Hematology

## 2021-09-12 ENCOUNTER — Other Ambulatory Visit: Payer: Self-pay

## 2021-09-12 ENCOUNTER — Ambulatory Visit
Admission: RE | Admit: 2021-09-12 | Discharge: 2021-09-12 | Disposition: A | Discharge status: Home / Self Care | Payer: Medicare Other | Source: Ambulatory Visit | Attending: Radiation Oncology | Admitting: Radiation Oncology

## 2021-09-12 DIAGNOSIS — C20 Malignant neoplasm of rectum: Secondary | ICD-10-CM | POA: Diagnosis not present

## 2021-09-12 NOTE — Telephone Encounter (Signed)
Sch per 11/7 inbasket , pt daughter aware

## 2021-09-13 ENCOUNTER — Other Ambulatory Visit: Payer: Self-pay

## 2021-09-13 ENCOUNTER — Ambulatory Visit
Admission: RE | Admit: 2021-09-13 | Discharge: 2021-09-13 | Disposition: A | Discharge status: Home / Self Care | Payer: Medicare Other | Source: Ambulatory Visit | Attending: Radiation Oncology | Admitting: Radiation Oncology

## 2021-09-13 DIAGNOSIS — C20 Malignant neoplasm of rectum: Secondary | ICD-10-CM | POA: Diagnosis not present

## 2021-09-14 ENCOUNTER — Ambulatory Visit (INDEPENDENT_AMBULATORY_CARE_PROVIDER_SITE_OTHER): Payer: Medicare Other | Admitting: Pulmonary Disease

## 2021-09-14 ENCOUNTER — Telehealth: Payer: Self-pay | Admitting: Pulmonary Disease

## 2021-09-14 ENCOUNTER — Encounter: Payer: Self-pay | Admitting: Pulmonary Disease

## 2021-09-14 ENCOUNTER — Inpatient Hospital Stay: Payer: Medicare Other

## 2021-09-14 ENCOUNTER — Ambulatory Visit
Admission: RE | Admit: 2021-09-14 | Discharge: 2021-09-14 | Disposition: A | Discharge status: Home / Self Care | Payer: Medicare Other | Source: Ambulatory Visit | Attending: Radiation Oncology | Admitting: Radiation Oncology

## 2021-09-14 ENCOUNTER — Inpatient Hospital Stay (HOSPITAL_BASED_OUTPATIENT_CLINIC_OR_DEPARTMENT_OTHER): Payer: Medicare Other | Admitting: Hematology

## 2021-09-14 ENCOUNTER — Other Ambulatory Visit: Payer: Medicare Other

## 2021-09-14 ENCOUNTER — Encounter: Payer: Self-pay | Admitting: Hematology

## 2021-09-14 VITALS — BP 140/85 | HR 92 | Temp 98.4°F | Resp 19 | Ht 71.0 in | Wt 139.3 lb

## 2021-09-14 VITALS — BP 128/78 | HR 77 | Temp 98.6°F | Ht 71.0 in | Wt 139.0 lb

## 2021-09-14 DIAGNOSIS — J432 Centrilobular emphysema: Secondary | ICD-10-CM | POA: Diagnosis not present

## 2021-09-14 DIAGNOSIS — C20 Malignant neoplasm of rectum: Secondary | ICD-10-CM | POA: Diagnosis not present

## 2021-09-14 DIAGNOSIS — R918 Other nonspecific abnormal finding of lung field: Secondary | ICD-10-CM | POA: Diagnosis not present

## 2021-09-14 DIAGNOSIS — R911 Solitary pulmonary nodule: Secondary | ICD-10-CM

## 2021-09-14 LAB — CBC WITH DIFFERENTIAL (CANCER CENTER ONLY)
Abs Immature Granulocytes: 0.04 10*3/uL (ref 0.00–0.07)
Basophils Absolute: 0 10*3/uL (ref 0.0–0.1)
Basophils Relative: 0 %
Eosinophils Absolute: 0.1 10*3/uL (ref 0.0–0.5)
Eosinophils Relative: 2 %
HCT: 36.2 % — ABNORMAL LOW (ref 39.0–52.0)
Hemoglobin: 11.6 g/dL — ABNORMAL LOW (ref 13.0–17.0)
Immature Granulocytes: 1 %
Lymphocytes Relative: 16 %
Lymphs Abs: 1 10*3/uL (ref 0.7–4.0)
MCH: 27.6 pg (ref 26.0–34.0)
MCHC: 32 g/dL (ref 30.0–36.0)
MCV: 86.2 fL (ref 80.0–100.0)
Monocytes Absolute: 0.9 10*3/uL (ref 0.1–1.0)
Monocytes Relative: 14 %
Neutro Abs: 4.1 10*3/uL (ref 1.7–7.7)
Neutrophils Relative %: 67 %
Platelet Count: 360 10*3/uL (ref 150–400)
RBC: 4.2 MIL/uL — ABNORMAL LOW (ref 4.22–5.81)
RDW: 14.4 % (ref 11.5–15.5)
WBC Count: 6 10*3/uL (ref 4.0–10.5)
nRBC: 0 % (ref 0.0–0.2)

## 2021-09-14 LAB — CMP (CANCER CENTER ONLY)
ALT: 16 U/L (ref 0–44)
AST: 21 U/L (ref 15–41)
Albumin: 2.9 g/dL — ABNORMAL LOW (ref 3.5–5.0)
Alkaline Phosphatase: 92 U/L (ref 38–126)
Anion gap: 8 (ref 5–15)
BUN: 14 mg/dL (ref 8–23)
CO2: 27 mmol/L (ref 22–32)
Calcium: 8.9 mg/dL (ref 8.9–10.3)
Chloride: 99 mmol/L (ref 98–111)
Creatinine: 0.86 mg/dL (ref 0.61–1.24)
GFR, Estimated: >60 mL/min (ref >60)
Glucose, Bld: 98 mg/dL (ref 70–99)
Potassium: 4.5 mmol/L (ref 3.5–5.1)
Sodium: 134 mmol/L — ABNORMAL LOW (ref 135–145)
Total Bilirubin: 0.4 mg/dL (ref 0.3–1.2)
Total Protein: 7.7 g/dL (ref 6.5–8.1)

## 2021-09-14 NOTE — H&P (View-Only) (Signed)
Synopsis: Referred in Nov 2022 for lung nodule by No ref. 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  This was diagnosed in October 2022.  At this time he had a CT scan of the chest abdomen pelvis for staging which showed upper lobe pulmonary nodules within the left side.  Largest greater than 2 cm concerning for primary lung cancer versus metastatic disease.  Patient has underwent biopsy of the rectal lesion as well as already started radiation treatments.  Patient was referred here for evaluation of abnormalities within the chest and for consideration of tissue biopsy of the lung lesion.  He is a former smoker.  Recently moved up here from [facility] about a year ago.  He is accompanied today in the office by his grand son.   Past Medical History:  Diagnosis Date   Rectal adenocarcinoma metastatic to intrapelvic lymph node ([place]) 08/19/2021     Family History  Problem Relation Age of Onset   Cancer Mother 75       unknown type cancer   Colon cancer Neg Hx    Esophageal cancer Neg Hx    Rectal cancer Neg Hx    Stomach cancer Neg Hx      Past Surgical History:  Procedure Laterality Date   left breast surgery Left 1968    Social History   Socioeconomic History   Marital status: Widowed    Spouse name: Not on file   Number of children: 5   Years of education: Not on file   Highest education level: Not on file  Occupational History   Not on file  Tobacco Use   Smoking status: Some Days    Packs/day: 0.25    Years: 20.00    Pack years: 5.00    Types: Cigarettes   Smokeless tobacco: Never  Substance and Sexual Activity   Alcohol use: Not Currently    Comment: 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,  stopped in 02/2021   Drug use: Never   Sexual activity: Not on file  Other Topics Concern   Not on file  Social History Narrative   Not on file   Social Determinants of Health   Financial Resource Strain: Not on file  Food Insecurity: Not on file  Transportation Needs: Not on file  Physical Activity: Not on file  Stress: Not on file  Social Connections: Not on file  Intimate Partner Violence: Not on file     No Known Allergies   Outpatient Medications Prior to Visit  Medication Sig Dispense Refill   Bisacodyl (LAXATIVE PO) Take by mouth.     capecitabine (XELODA) 500 MG tablet Take 3 tablets (1,500 mg total) by mouth 2 (two) times daily after a meal. Take with concurrent radiation, Monday through Friday, off on weekends 90 tablet 0   carbonyl iron (FEOSOL) 45 MG TABS tablet Take 45 mg by mouth.     hydrocortisone (ANUSOL-HC) 2.5 % rectal cream Place 1 application rectally 2 (two) times daily. 30 g 0   morphine (MS CONTIN) 15 MG 12 hr tablet Take 1 tablet (15 mg total) by mouth every 12 (twelve) hours. 30 tablet 0   ondansetron (ZOFRAN) 8 MG tablet Take 1 tablet (8 mg  total) by mouth every 8 (eight) hours as needed for nausea or vomiting. 30 tablet 0   Oxycodone HCl 10 MG TABS Take 1 tablet (10 mg total) by mouth every 4 (four) hours as needed. 60 tablet 0   oxyCODONE (ROXICODONE) 5 MG immediate release tablet Take 1 tablet (5 mg total) by mouth every 8 (eight) hours as needed for severe pain. (Patient not taking: Reported on 09/14/2021) 90 tablet 0   prochlorperazine (COMPAZINE) 10 MG tablet Take 1 tablet (10 mg total) by mouth every 6 (six) hours as needed for nausea or vomiting. (Patient not taking: Reported on 09/14/2021) 30 tablet 0   No facility-administered medications prior to visit.    Review of Systems  Constitutional:  Positive for malaise/fatigue and weight loss. Negative for chills and fever.  HENT:  Negative for hearing loss, sore throat and tinnitus.   Eyes:   Negative for blurred vision and double vision.  Respiratory:  Negative for cough, hemoptysis, sputum production, shortness of breath, wheezing and stridor.   Cardiovascular:  Negative for chest pain, palpitations, orthopnea, leg swelling and PND.  Gastrointestinal:  Negative for abdominal pain, constipation, diarrhea, heartburn, nausea and vomiting.  Genitourinary:  Negative for dysuria, hematuria and urgency.  Musculoskeletal:  Negative for joint pain and myalgias.  Skin:  Negative for itching and rash.  Neurological:  Negative for dizziness, tingling, weakness and headaches.  Endo/Heme/Allergies:  Negative for environmental allergies. Does not bruise/bleed easily.  Psychiatric/Behavioral:  Negative for depression. The patient is nervous/anxious. The patient does not have insomnia.   All other systems reviewed and are negative.   Objective:  Physical Exam Vitals reviewed.  Constitutional:      General: He is not in acute distress.    Appearance: He is well-developed.  HENT:     Head: Normocephalic and atraumatic.  Eyes:     General: No scleral icterus.    Conjunctiva/sclera: Conjunctivae normal.     Pupils: Pupils are equal, round, and reactive to light.  Neck:     Vascular: No JVD.     Trachea: No tracheal deviation.  Cardiovascular:     Rate and Rhythm: Normal rate and regular rhythm.     Heart sounds: Normal heart sounds. No murmur heard. Pulmonary:     Effort: Pulmonary effort is normal. No tachypnea, accessory muscle usage or respiratory distress.     Breath sounds: No stridor. No wheezing, rhonchi or rales.  Abdominal:     General: Bowel sounds are normal. There is no distension.     Palpations: Abdomen is soft.     Tenderness: There is no abdominal tenderness.  Musculoskeletal:        General: No tenderness.     Cervical back: Neck supple.  Lymphadenopathy:     Cervical: No cervical adenopathy.  Skin:    General: Skin is warm and dry.     Capillary Refill:  Capillary refill takes less than 2 seconds.     Findings: No rash.  Neurological:     Mental Status: He is alert and oriented to person, place, and time.  Psychiatric:        Behavior: Behavior normal.     Vitals:   09/14/21 0929  BP: 128/78  Pulse: 77  Temp: 98.6 F (37 C)  TempSrc: Oral  SpO2: 98%  Weight: 139 lb (63 kg)  Height: 5\' 11"  (1.803 m)   98% on RA BMI Readings from Last 3 Encounters:  09/14/21 19.39 kg/m  09/06/21 19.34 kg/m  08/30/21 19.60 kg/m   Wt Readings from Last 3 Encounters:  09/14/21 139 lb (63 kg)  09/06/21 142 lb 9.6 oz (64.7 kg)  08/30/21 144 lb 8 oz (65.5 kg)     CBC    Component Value Date/Time   WBC 7.6 09/06/2021 0854   WBC 7.7 08/11/2021 1603   RBC 4.44 09/06/2021 0854   HGB 12.2 (L) 09/06/2021 0854   HCT 38.2 (L) 09/06/2021 0854   PLT 459 (H) 09/06/2021 0854   MCV 86.0 09/06/2021 0854   MCH 27.5 09/06/2021 0854   MCHC 31.9 09/06/2021 0854   RDW 14.0 09/06/2021 0854   LYMPHSABS 1.6 09/06/2021 0854   MONOABS 0.9 09/06/2021 0854   EOSABS 0.1 09/06/2021 0854   BASOSABS 0.0 09/06/2021 0854     Chest Imaging: 08/26/2021 CT chest: Known rectal mass male with upper lobe nodules within the left side concerning for malignancy within the left lung. The patient's images have been independently reviewed by me.    Pulmonary Functions Testing Results: No flowsheet data found.  FeNO:   Pathology:   Echocardiogram:   Heart Catheterization:     Assessment & Plan:     ICD-10-CM   1. Lung nodule  R91.1 Ambulatory referral to Pulmonology    Procedural/ Surgical Case Request: VIDEO BRONCHOSCOPY WITH ENDOBRONCHIAL NAVIGATION    2. Centrilobular emphysema ([place])  J43.2     3. Multiple lung nodules  R91.8       Discussion: This is a 70 year old gentleman, former smoker, centrilobular emphysema.  Also has a rectal adenocarcinoma.  Undergoing radiation treatments.  Already established with radiation and medical oncology.  A  staging CT imaging revealed upper lobe lung nodule on the left side concerning for malignancy patient was referred here for evaluation of lung nodule consideration for tissue biopsy.  Plan: Today in the office we discussed the risk benefits and alternatives of proceeding with navigational bronchoscopy. We discussed the risk of bleeding and pneumothorax. Patient is agreeable to proceed with planned biopsy. We have set a tentative date for 09/27/2021. Currently not on any blood thinners or antiplatelets. No barriers to proceed at this time. Patient is accompanied today with his grandson. His grandson lives in [place] and would like to be able to be here for his planning with him and procedure. We will try to help accommodate and schedule Return to clinic on 10/03/2021, Eric Form, NP to discuss pathology   Current Outpatient Medications:    Bisacodyl (LAXATIVE PO), Take by mouth., Disp: , Rfl:    capecitabine (XELODA) 500 MG tablet, Take 3 tablets (1,500 mg total) by mouth 2 (two) times daily after a meal. Take with concurrent radiation, Monday through Friday, off on weekends, Disp: 90 tablet, Rfl: 0   carbonyl iron (FEOSOL) 45 MG TABS tablet, Take 45 mg by mouth., Disp: , Rfl:    hydrocortisone (ANUSOL-HC) 2.5 % rectal cream, Place 1 application rectally 2 (two) times daily., Disp: 30 g, Rfl: 0   morphine (MS CONTIN) 15 MG 12 hr tablet, Take 1 tablet (15 mg total) by mouth every 12 (twelve) hours., Disp: 30 tablet, Rfl: 0   ondansetron (ZOFRAN) 8 MG tablet, Take 1 tablet (8 mg total) by mouth every 8 (eight) hours as needed for nausea or vomiting., Disp: 30 tablet, Rfl: 0   Oxycodone HCl 10 MG TABS, Take 1 tablet (10 mg total) by mouth every 4 (four) hours as needed., Disp: 60 tablet, Rfl: 0   oxyCODONE (ROXICODONE) 5 MG immediate release tablet,  Take 1 tablet (5 mg total) by mouth every 8 (eight) hours as needed for severe pain. (Patient not taking: Reported on 09/14/2021), Disp: 90 tablet, Rfl:  0   prochlorperazine (COMPAZINE) 10 MG tablet, Take 1 tablet (10 mg total) by mouth every 6 (six) hours as needed for nausea or vomiting. (Patient not taking: Reported on 09/14/2021), Disp: 30 tablet, Rfl: 0  I spent 62 minutes dedicated to the care of this patient on the date of this encounter to include pre-visit review of records, face-to-face time with the patient discussing conditions above, post visit ordering of testing, clinical documentation with the electronic health record, making appropriate referrals as documented, and communicating necessary findings to members of the patients care team.   Garner Nash, DO [facility] 09/14/2021 9:37 AM

## 2021-09-14 NOTE — Telephone Encounter (Signed)
I scheduled pt for 11/22 at 12:45.  Pt will go for covid test on 11/18.  I called [facility] and spoke to [facility] - she will have disk made and sent to [facility].  Left vm for pt to call me back for appt info.

## 2021-09-14 NOTE — Progress Notes (Signed)
Synopsis: Referred in Nov 2022 for lung nodule by No ref. 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  This was diagnosed in October 2022.  At this time he had a CT scan of the chest abdomen pelvis for staging which showed upper lobe pulmonary nodules within the left side.  Largest greater than 2 cm concerning for primary lung cancer versus metastatic disease.  Patient has underwent biopsy of the rectal lesion as well as already started radiation treatments.  Patient was referred here for evaluation of abnormalities within the chest and for consideration of tissue biopsy of the lung lesion.  He is a former smoker.  Recently moved up here from [facility] about a year ago.  He is accompanied today in the office by his grand son.   Past Medical History:  Diagnosis Date   Rectal adenocarcinoma metastatic to intrapelvic lymph node ([place]) 08/19/2021     Family History  Problem Relation Age of Onset   Cancer Mother 59       unknown type cancer   Colon cancer Neg Hx    Esophageal cancer Neg Hx    Rectal cancer Neg Hx    Stomach cancer Neg Hx      Past Surgical History:  Procedure Laterality Date   left breast surgery Left 1968    Social History   Socioeconomic History   Marital status: Widowed    Spouse name: Not on file   Number of children: 5   Years of education: Not on file   Highest education level: Not on file  Occupational History   Not on file  Tobacco Use   Smoking status: Some Days    Packs/day: 0.25    Years: 20.00    Pack years: 5.00    Types: Cigarettes   Smokeless tobacco: Never  Substance and Sexual Activity   Alcohol use: Not Currently    Comment: 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,  stopped in 02/2021   Drug use: Never   Sexual activity: Not on file  Other Topics Concern   Not on file  Social History Narrative   Not on file   Social Determinants of Health   Financial Resource Strain: Not on file  Food Insecurity: Not on file  Transportation Needs: Not on file  Physical Activity: Not on file  Stress: Not on file  Social Connections: Not on file  Intimate Partner Violence: Not on file     No Known Allergies   Outpatient Medications Prior to Visit  Medication Sig Dispense Refill   Bisacodyl (LAXATIVE PO) Take by mouth.     capecitabine (XELODA) 500 MG tablet Take 3 tablets (1,500 mg total) by mouth 2 (two) times daily after a meal. Take with concurrent radiation, Monday through Friday, off on weekends 90 tablet 0   carbonyl iron (FEOSOL) 45 MG TABS tablet Take 45 mg by mouth.     hydrocortisone (ANUSOL-HC) 2.5 % rectal cream Place 1 application rectally 2 (two) times daily. 30 g 0   morphine (MS CONTIN) 15 MG 12 hr tablet Take 1 tablet (15 mg total) by mouth every 12 (twelve) hours. 30 tablet 0   ondansetron (ZOFRAN) 8 MG tablet Take 1 tablet (8 mg  total) by mouth every 8 (eight) hours as needed for nausea or vomiting. 30 tablet 0   Oxycodone HCl 10 MG TABS Take 1 tablet (10 mg total) by mouth every 4 (four) hours as needed. 60 tablet 0   oxyCODONE (ROXICODONE) 5 MG immediate release tablet Take 1 tablet (5 mg total) by mouth every 8 (eight) hours as needed for severe pain. (Patient not taking: Reported on 09/14/2021) 90 tablet 0   prochlorperazine (COMPAZINE) 10 MG tablet Take 1 tablet (10 mg total) by mouth every 6 (six) hours as needed for nausea or vomiting. (Patient not taking: Reported on 09/14/2021) 30 tablet 0   No facility-administered medications prior to visit.    Review of Systems  Constitutional:  Positive for malaise/fatigue and weight loss. Negative for chills and fever.  HENT:  Negative for hearing loss, sore throat and tinnitus.   Eyes:   Negative for blurred vision and double vision.  Respiratory:  Negative for cough, hemoptysis, sputum production, shortness of breath, wheezing and stridor.   Cardiovascular:  Negative for chest pain, palpitations, orthopnea, leg swelling and PND.  Gastrointestinal:  Negative for abdominal pain, constipation, diarrhea, heartburn, nausea and vomiting.  Genitourinary:  Negative for dysuria, hematuria and urgency.  Musculoskeletal:  Negative for joint pain and myalgias.  Skin:  Negative for itching and rash.  Neurological:  Negative for dizziness, tingling, weakness and headaches.  Endo/Heme/Allergies:  Negative for environmental allergies. Does not bruise/bleed easily.  Psychiatric/Behavioral:  Negative for depression. The patient is nervous/anxious. The patient does not have insomnia.   All other systems reviewed and are negative.   Objective:  Physical Exam Vitals reviewed.  Constitutional:      General: He is not in acute distress.    Appearance: He is well-developed.  HENT:     Head: Normocephalic and atraumatic.  Eyes:     General: No scleral icterus.    Conjunctiva/sclera: Conjunctivae normal.     Pupils: Pupils are equal, round, and reactive to light.  Neck:     Vascular: No JVD.     Trachea: No tracheal deviation.  Cardiovascular:     Rate and Rhythm: Normal rate and regular rhythm.     Heart sounds: Normal heart sounds. No murmur heard. Pulmonary:     Effort: Pulmonary effort is normal. No tachypnea, accessory muscle usage or respiratory distress.     Breath sounds: No stridor. No wheezing, rhonchi or rales.  Abdominal:     General: Bowel sounds are normal. There is no distension.     Palpations: Abdomen is soft.     Tenderness: There is no abdominal tenderness.  Musculoskeletal:        General: No tenderness.     Cervical back: Neck supple.  Lymphadenopathy:     Cervical: No cervical adenopathy.  Skin:    General: Skin is warm and dry.     Capillary Refill:  Capillary refill takes less than 2 seconds.     Findings: No rash.  Neurological:     Mental Status: He is alert and oriented to person, place, and time.  Psychiatric:        Behavior: Behavior normal.     Vitals:   09/14/21 0929  BP: 128/78  Pulse: 77  Temp: 98.6 F (37 C)  TempSrc: Oral  SpO2: 98%  Weight: 139 lb (63 kg)  Height: 5\' 11"  (1.803 m)   98% on RA BMI Readings from Last 3 Encounters:  09/14/21 19.39 kg/m  09/06/21 19.34 kg/m  08/30/21 19.60 kg/m   Wt Readings from Last 3 Encounters:  09/14/21 139 lb (63 kg)  09/06/21 142 lb 9.6 oz (64.7 kg)  08/30/21 144 lb 8 oz (65.5 kg)     CBC    Component Value Date/Time   WBC 7.6 09/06/2021 0854   WBC 7.7 08/11/2021 1603   RBC 4.44 09/06/2021 0854   HGB 12.2 (L) 09/06/2021 0854   HCT 38.2 (L) 09/06/2021 0854   PLT 459 (H) 09/06/2021 0854   MCV 86.0 09/06/2021 0854   MCH 27.5 09/06/2021 0854   MCHC 31.9 09/06/2021 0854   RDW 14.0 09/06/2021 0854   LYMPHSABS 1.6 09/06/2021 0854   MONOABS 0.9 09/06/2021 0854   EOSABS 0.1 09/06/2021 0854   BASOSABS 0.0 09/06/2021 0854     Chest Imaging: 08/26/2021 CT chest: Known rectal mass male with upper lobe nodules within the left side concerning for malignancy within the left lung. The patient's images have been independently reviewed by me.    Pulmonary Functions Testing Results: No flowsheet data found.  FeNO:   Pathology:   Echocardiogram:   Heart Catheterization:     Assessment & Plan:     ICD-10-CM   1. Lung nodule  R91.1 Ambulatory referral to Pulmonology    Procedural/ Surgical Case Request: VIDEO BRONCHOSCOPY WITH ENDOBRONCHIAL NAVIGATION    2. Centrilobular emphysema ([place])  J43.2     3. Multiple lung nodules  R91.8       Discussion: This is a 70 year old gentleman, former smoker, centrilobular emphysema.  Also has a rectal adenocarcinoma.  Undergoing radiation treatments.  Already established with radiation and medical oncology.  A  staging CT imaging revealed upper lobe lung nodule on the left side concerning for malignancy patient was referred here for evaluation of lung nodule consideration for tissue biopsy.  Plan: Today in the office we discussed the risk benefits and alternatives of proceeding with navigational bronchoscopy. We discussed the risk of bleeding and pneumothorax. Patient is agreeable to proceed with planned biopsy. We have set a tentative date for 09/27/2021. Currently not on any blood thinners or antiplatelets. No barriers to proceed at this time. Patient is accompanied today with his grandson. His grandson lives in [place] and would like to be able to be here for his planning with him and procedure. We will try to help accommodate and schedule Return to clinic on 10/03/2021, Eric Form, NP to discuss pathology   Current Outpatient Medications:    Bisacodyl (LAXATIVE PO), Take by mouth., Disp: , Rfl:    capecitabine (XELODA) 500 MG tablet, Take 3 tablets (1,500 mg total) by mouth 2 (two) times daily after a meal. Take with concurrent radiation, Monday through Friday, off on weekends, Disp: 90 tablet, Rfl: 0   carbonyl iron (FEOSOL) 45 MG TABS tablet, Take 45 mg by mouth., Disp: , Rfl:    hydrocortisone (ANUSOL-HC) 2.5 % rectal cream, Place 1 application rectally 2 (two) times daily., Disp: 30 g, Rfl: 0   morphine (MS CONTIN) 15 MG 12 hr tablet, Take 1 tablet (15 mg total) by mouth every 12 (twelve) hours., Disp: 30 tablet, Rfl: 0   ondansetron (ZOFRAN) 8 MG tablet, Take 1 tablet (8 mg total) by mouth every 8 (eight) hours as needed for nausea or vomiting., Disp: 30 tablet, Rfl: 0   Oxycodone HCl 10 MG TABS, Take 1 tablet (10 mg total) by mouth every 4 (four) hours as needed., Disp: 60 tablet, Rfl: 0   oxyCODONE (ROXICODONE) 5 MG immediate release tablet,  Take 1 tablet (5 mg total) by mouth every 8 (eight) hours as needed for severe pain. (Patient not taking: Reported on 09/14/2021), Disp: 90 tablet, Rfl:  0   prochlorperazine (COMPAZINE) 10 MG tablet, Take 1 tablet (10 mg total) by mouth every 6 (six) hours as needed for nausea or vomiting. (Patient not taking: Reported on 09/14/2021), Disp: 30 tablet, Rfl: 0  I spent 62 minutes dedicated to the care of this patient on the date of this encounter to include pre-visit review of records, face-to-face time with the patient discussing conditions above, post visit ordering of testing, clinical documentation with the electronic health record, making appropriate referrals as documented, and communicating necessary findings to members of the patients care team.   Garner Nash, DO [facility] 09/14/2021 9:37 AM

## 2021-09-14 NOTE — Patient Instructions (Signed)
Thank you for visiting Dr. Valeta Harms at [facility]. Today we recommend the following:  Orders Placed This Encounter  Procedures   Procedural/ Surgical Case Request: Jorge [name]   Ambulatory referral to Pulmonology   Tentative Bronchoscopy date for 09/27/2021  Return in about 19 days (around 10/03/2021) for with Eric Form, NP.    Please do your part to reduce the spread of COVID-19.

## 2021-09-14 NOTE — Progress Notes (Signed)
[place]   [phone] Fax:(336) [phone]   Clinic Follow up Note   Patient Care Team: Pcp, No as PCP - General  Date of Service:  09/14/2021  CHIEF COMPLAINT: f/u of rectal cancer  CURRENT THERAPY:  Concurrent chemoRT with Xeloda, starting 09/06/21  ASSESSMENT & PLAN:  Jorge [name] is a 70 y.o. male with   1. 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 CT AP 07/23/21 showed rectal mass with no significant adenopathy or distant metastasis. Colonoscopy 08/19/21 showed a 7 cm infiltrative mass in the mid rectum (6-13cm from anal verge), pathology showed adenocarcinoma. MMR is normal -staging pelvic MRI 08/27/21 showed: staging at T4b; N2 likely associated with early extra mesorectal lymph node involvement; distance from tumor to internal anal sphincter is 1.2 cm; also with extramural venous involvement. -he began concurrent chemoRT with Xeloda on 09/06/21. He is tolerating well so far and is able to pass stool easier.   2. Symptom Management: Rectal pain, weight loss, fatigue -Secondary to rectal cancer  -he is on oxycodone and MS Contin, pain is controlled. -Dietitian referral; he did not show to 09/01/21 appointment   3. Mild anemia from rectal bleeding, probable iron deficiency -He has started oral iron, tolerating well.  We will continue.   4. Pulmonary nodules -seen on chest CT 08/26/21 showing 2.2 cm RML nodule and four RUL nodules/masses measuring up to 3.6 cm. Findings concerning for metastatic primary lung cancer. -he met with Dr. Valeta Harms earlier today (09/14/21). He is tentatively scheduled for bronchoscopy on 09/27/21.   5. Multiple colon polyps -He has a large 3.5 cm polyps in the hepatic flexure, biopsy showed tubular adenoma.  Dr. [name] plan to repeat a colonoscopy in 6 months and remove it -He had additional 4 small polyps which were removed,  path showed tubular adenomas.     PLAN:  -continue radiation and Xeloda M-F -lab and f/u as scheduled 09/23/21 -proceed with bronchoscopy 09/27/21 (or per Dr. Valeta Harms)   No problem-specific Assessment & Plan notes found for this encounter.   SUMMARY OF ONCOLOGIC HISTORY: Oncology History Overview Note  Cancer Staging Rectal cancer [facility]) Staging form: Colon and Rectum, AJCC 8th Edition - Clinical stage from 08/29/2021: Jorge [name], Jorge - Signed by [name], Jorge [name] on 08/29/2021    Rectal cancer ([place])  07/23/2021 Imaging   CT AP  IMPRESSION: 1. Appearance of the rectum likely indicates a rectal mass suspicious for rectal carcinoma. Inflammatory process would be less likely. Direct visualization is suggested. 2. Cholelithiasis without evidence of acute cholecystitis. 3. Aortic atherosclerosis.   08/19/2021 Procedure   Colonoscopy, under Dr. [name]  Impression: - Rectal tenderness, palpable rectal mass and hemorrhoids found on digital rectal exam. - Stool in the entire examined colon - lavaged with still inadequate clearance. - One 35 mm polyp at the hepatic flexure. Biopsied. Tattooed distal in case future endoscopic resection is considered - however, has larger issues with rectal mass currently as below. - Four, 5 to 11 mm polyps in the transverse colon and at the hepatic flexure, removed with a cold snare. Resected and retrieved. - Diverticulosis in the recto-sigmoid colon and in the sigmoid colon. - Rule out malignancy, partially obstructing tumor in the rectum and from 6 to 13 cm proximal to the anus. Biopsied.   08/19/2021 Pathology Results   Diagnosis 1. Hepatic Flexure Biopsy - TUBULAR ADENOMA WITHOUT HIGH-GRADE DYSPLASIA OR MALIGNANCY 2. Transverse Colon Biopsy, and  hepatic flexure, polyps (4) - TUBULAR ADENOMA WITHOUT HIGH-GRADE DYSPLASIA OR MALIGNANCY - OTHER FRAGMENTS OF POLYPOID COLONIC MUCOSA WITH NO SPECIFIC HISTOPATHOLOGIC CHANGES - FOOD MATERIAL 3. Rectum,  biopsy - ADENOCARCINOMA. SEE NOTE   08/22/2021 Initial Diagnosis   Rectal cancer ([place])   08/26/2021 Imaging   IMPRESSION: 1. A 2.2 x 1.9 cm right middle lobe pulmonary nodule as well as a total of four right upper lobe pulmonary nodule and masses measuring up to 3.6 cm. Findings concerning for metastatic primary lung cancer versus less likely metastases in a patient with rectal cancer. Additional imaging evaluation or consultation with Pulmonology or Thoracic Surgery recommended. 2. No gross hilar adenopathy, noting limited sensitivity for the detection of hilar adenopathy on this noncontrast study. 3. Cholelithiasis. 4.  Emphysema (ICD10-J43.9). 5. At least left anterior descending coronary artery calcifications.   08/27/2021 Imaging   IMPRESSION: Rectal adenocarcinoma T stage: T4 B   Rectal adenocarcinoma N stage: N2 disease likely associated with early extra mesorectal lymph node involvement.   Distance from tumor to the internal anal sphincter is 1.2 cm.   Also with extramural venous involvement as described.   08/29/2021 Cancer Staging   Staging form: Colon and Rectum, AJCC 8th Edition - Clinical stage from 08/29/2021: Jorge [name], Jorge - Signed by [name], Jorge [name] on 08/29/2021 Stage prefix: Initial diagnosis       INTERVAL HISTORY:  Jorge [name] is here for a follow up of rectal cancer. He was last seen by me on 09/06/21. He presents to the clinic accompanied by his daughter. He states he is hopeful now. He denies pain. He reports he is passing stool easier now and has one bowel movement a day. He notes he has been eating well and supplementing with Ensure. He reports he is not waking up at night with pain since starting the MS contin.   All other systems were reviewed with the patient and are negative.  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  ALLERGIES:  has No Known Allergies.  MEDICATIONS:  Current Outpatient Medications  Medication Sig Dispense Refill   Bisacodyl (LAXATIVE PO) Take by mouth.     capecitabine (XELODA) 500 MG tablet Take 3 tablets (1,500 mg total) by mouth 2 (two) times daily after a meal. Take with concurrent radiation, Monday through Friday, off on weekends 90 tablet 0   carbonyl iron (FEOSOL) 45 MG TABS tablet Take 45 mg by mouth.     hydrocortisone (ANUSOL-HC) 2.5 % rectal cream Place 1 application rectally 2 (two) times daily. 30 g 0   morphine (MS CONTIN) 15 MG 12 hr tablet Take 1 tablet (15 mg total) by mouth every 12 (twelve) hours. 30 tablet 0   ondansetron (ZOFRAN) 8 MG tablet Take 1 tablet (8 mg total) by mouth every 8 (eight) hours as needed for nausea or vomiting. 30 tablet 0   oxyCODONE (ROXICODONE) 5 MG immediate release tablet Take 1 tablet (5 mg total) by mouth every 8 (eight) hours as needed for severe pain. (Patient not taking: Reported on 09/14/2021) 90 tablet 0   Oxycodone HCl 10 MG TABS Take 1 tablet (10 mg total) by mouth every 4 (four) hours  as needed. 60 tablet 0   prochlorperazine (COMPAZINE) 10 MG tablet Take 1 tablet (10 mg total) by mouth every 6 (six) hours as needed for nausea or vomiting. (Patient not taking: Reported on 09/14/2021) 30 tablet 0   No current facility-administered medications for this visit.    PHYSICAL EXAMINATION: ECOG PERFORMANCE STATUS: 1 - Symptomatic but completely ambulatory  Vitals:   09/14/21 1547  BP: 140/85  Pulse: 92  Resp: 19  Temp: 98.4 F (36.9 C)  SpO2: 99%   Wt Readings from Last 3 Encounters:  09/14/21 139 lb 4.8 oz (63.2 kg)  09/14/21 139 lb (63 kg)  09/06/21 142 lb 9.6 oz (64.7 kg)     GENERAL:alert, no distress and comfortable SKIN: skin  color normal, no rashes or significant lesions EYES: normal, Conjunctiva are pink and non-injected, sclera clear  NEURO: alert & oriented x 3 with fluent speech  LABORATORY DATA:  I have reviewed the data as listed CBC Latest Ref Rng & Units 09/14/2021 09/06/2021 08/11/2021  WBC 4.0 - 10.5 K/uL 6.0 7.6 7.7  Hemoglobin 13.0 - 17.0 g/dL 11.6(L) 12.2(L) 12.3(L)  Hematocrit 39.0 - 52.0 % 36.2(L) 38.2(L) 38.0(L)  Platelets 150 - 400 K/uL 360 459(H) 397     CMP Latest Ref Rng & Units 09/14/2021 09/06/2021 08/11/2021  Glucose 70 - 99 mg/dL 98 102(H) 98  BUN 8 - 23 mg/dL '14 9 13  ' Creatinine 0.61 - 1.24 mg/dL 0.86 0.83 0.71  Sodium 135 - 145 mmol/L 134(L) 137 138  Potassium 3.5 - 5.1 mmol/L 4.5 4.1 3.9  Chloride 98 - 111 mmol/L 99 101 107  CO2 22 - 32 mmol/L '27 28 25  ' Calcium 8.9 - 10.3 mg/dL 8.9 8.9 9.0  Total Protein 6.5 - 8.1 g/dL 7.7 8.1 7.9  Total Bilirubin 0.3 - 1.2 mg/dL 0.4 0.4 0.7  Alkaline Phos 38 - 126 U/L 92 103 88  AST 15 - 41 U/L '21 18 18  ' ALT 0 - 44 U/L '16 14 14      ' RADIOGRAPHIC STUDIES: I have personally reviewed the radiological images as listed and agreed with the findings in the report. No results found.    No orders of the defined types were placed in this encounter.  All questions were answered. The patient knows to call the clinic with any problems, questions or concerns. No barriers to learning was detected. The total time spent in the appointment was 25 minutes.     [name], Jorge [name] 09/14/2021   I, Jorge [name], am acting as scribe for [name], Jorge [name].   I have reviewed the above documentation for accuracy and completeness, and I agree with the above.

## 2021-09-15 ENCOUNTER — Other Ambulatory Visit (HOSPITAL_COMMUNITY): Payer: Self-pay

## 2021-09-15 ENCOUNTER — Encounter: Payer: Medicare Other | Admitting: Nutrition

## 2021-09-15 ENCOUNTER — Other Ambulatory Visit: Payer: Self-pay

## 2021-09-15 ENCOUNTER — Encounter: Payer: Self-pay | Admitting: Nutrition

## 2021-09-15 ENCOUNTER — Ambulatory Visit
Admission: RE | Admit: 2021-09-15 | Discharge: 2021-09-15 | Disposition: A | Discharge status: Home / Self Care | Payer: Medicare Other | Source: Ambulatory Visit | Attending: Radiation Oncology | Admitting: Radiation Oncology

## 2021-09-15 DIAGNOSIS — C20 Malignant neoplasm of rectum: Secondary | ICD-10-CM | POA: Diagnosis not present

## 2021-09-15 LAB — CEA (IN HOUSE-CHCC): CEA (CHCC-In House): 537.13 ng/mL — ABNORMAL HIGH (ref 0.00–5.00)

## 2021-09-15 NOTE — Telephone Encounter (Signed)
Spoke to pt to give him appt info and he asked that I give it to his dtr.  Spoke to her and gave her info.

## 2021-09-15 NOTE — Progress Notes (Signed)
Patient did not show up for nutrition appointment. 

## 2021-09-16 ENCOUNTER — Ambulatory Visit
Admission: RE | Admit: 2021-09-16 | Discharge: 2021-09-16 | Disposition: A | Discharge status: Home / Self Care | Payer: Medicare Other | Source: Ambulatory Visit | Attending: Radiation Oncology | Admitting: Radiation Oncology

## 2021-09-16 DIAGNOSIS — C20 Malignant neoplasm of rectum: Secondary | ICD-10-CM | POA: Diagnosis not present

## 2021-09-18 ENCOUNTER — Other Ambulatory Visit: Payer: Self-pay | Admitting: Hematology

## 2021-09-19 ENCOUNTER — Ambulatory Visit
Admission: RE | Admit: 2021-09-19 | Discharge: 2021-09-19 | Disposition: A | Discharge status: Home / Self Care | Payer: Medicare Other | Source: Ambulatory Visit | Attending: Radiation Oncology | Admitting: Radiation Oncology

## 2021-09-19 ENCOUNTER — Other Ambulatory Visit: Payer: Self-pay

## 2021-09-19 ENCOUNTER — Other Ambulatory Visit: Payer: Self-pay | Admitting: Hematology

## 2021-09-19 DIAGNOSIS — C20 Malignant neoplasm of rectum: Secondary | ICD-10-CM | POA: Diagnosis not present

## 2021-09-19 MED ORDER — MORPHINE SULFATE ER 15 MG PO TBCR: 15.0000 mg | EXTENDED_RELEASE_TABLET | Freq: Two times a day (BID) | ORAL | 0 refills | Status: DC

## 2021-09-19 MED ORDER — OXYCODONE HCL 10 MG PO TABS: 10.0000 mg | ORAL_TABLET | ORAL | 0 refills | Status: DC | PRN

## 2021-09-20 ENCOUNTER — Ambulatory Visit
Admission: RE | Admit: 2021-09-20 | Discharge: 2021-09-20 | Disposition: A | Discharge status: Home / Self Care | Payer: Medicare Other | Source: Ambulatory Visit | Attending: Radiation Oncology | Admitting: Radiation Oncology

## 2021-09-20 ENCOUNTER — Other Ambulatory Visit (HOSPITAL_COMMUNITY): Payer: Self-pay

## 2021-09-20 DIAGNOSIS — C20 Malignant neoplasm of rectum: Secondary | ICD-10-CM | POA: Diagnosis not present

## 2021-09-21 ENCOUNTER — Other Ambulatory Visit: Payer: Self-pay

## 2021-09-21 ENCOUNTER — Ambulatory Visit
Admission: RE | Admit: 2021-09-21 | Discharge: 2021-09-21 | Disposition: A | Discharge status: Home / Self Care | Payer: Medicare Other | Source: Ambulatory Visit | Attending: Radiation Oncology | Admitting: Radiation Oncology

## 2021-09-21 DIAGNOSIS — C20 Malignant neoplasm of rectum: Secondary | ICD-10-CM | POA: Diagnosis not present

## 2021-09-22 ENCOUNTER — Ambulatory Visit
Admission: RE | Admit: 2021-09-22 | Discharge: 2021-09-22 | Disposition: A | Discharge status: Home / Self Care | Payer: Medicare Other | Source: Ambulatory Visit | Attending: Radiation Oncology | Admitting: Radiation Oncology

## 2021-09-22 DIAGNOSIS — C20 Malignant neoplasm of rectum: Secondary | ICD-10-CM | POA: Diagnosis not present

## 2021-09-23 ENCOUNTER — Ambulatory Visit
Admission: RE | Admit: 2021-09-23 | Discharge: 2021-09-23 | Disposition: A | Discharge status: Home / Self Care | Payer: Medicare Other | Source: Ambulatory Visit | Attending: Radiation Oncology | Admitting: Radiation Oncology

## 2021-09-23 ENCOUNTER — Inpatient Hospital Stay (HOSPITAL_BASED_OUTPATIENT_CLINIC_OR_DEPARTMENT_OTHER): Payer: Medicare Other | Admitting: Hematology

## 2021-09-23 ENCOUNTER — Inpatient Hospital Stay: Payer: Medicare Other

## 2021-09-23 ENCOUNTER — Other Ambulatory Visit: Payer: Self-pay | Admitting: Pulmonary Disease

## 2021-09-23 ENCOUNTER — Ambulatory Visit: Payer: Medicare Other

## 2021-09-23 ENCOUNTER — Other Ambulatory Visit: Payer: Self-pay

## 2021-09-23 DIAGNOSIS — C20 Malignant neoplasm of rectum: Secondary | ICD-10-CM

## 2021-09-23 LAB — CBC WITH DIFFERENTIAL (CANCER CENTER ONLY)
Abs Immature Granulocytes: 0.01 10*3/uL (ref 0.00–0.07)
Basophils Absolute: 0 10*3/uL (ref 0.0–0.1)
Basophils Relative: 1 %
Eosinophils Absolute: 0.3 10*3/uL (ref 0.0–0.5)
Eosinophils Relative: 7 %
HCT: 40.5 % (ref 39.0–52.0)
Hemoglobin: 12.8 g/dL — ABNORMAL LOW (ref 13.0–17.0)
Immature Granulocytes: 0 %
Lymphocytes Relative: 15 %
Lymphs Abs: 0.6 10*3/uL — ABNORMAL LOW (ref 0.7–4.0)
MCH: 27.5 pg (ref 26.0–34.0)
MCHC: 31.6 g/dL (ref 30.0–36.0)
MCV: 86.9 fL (ref 80.0–100.0)
Monocytes Absolute: 0.7 10*3/uL (ref 0.1–1.0)
Monocytes Relative: 16 %
Neutro Abs: 2.6 10*3/uL (ref 1.7–7.7)
Neutrophils Relative %: 61 %
Platelet Count: 286 10*3/uL (ref 150–400)
RBC: 4.66 MIL/uL (ref 4.22–5.81)
RDW: 15.3 % (ref 11.5–15.5)
WBC Count: 4.2 10*3/uL (ref 4.0–10.5)
nRBC: 0 % (ref 0.0–0.2)

## 2021-09-23 LAB — CMP (CANCER CENTER ONLY)
ALT: 26 U/L (ref 0–44)
AST: 31 U/L (ref 15–41)
Albumin: 3.3 g/dL — ABNORMAL LOW (ref 3.5–5.0)
Alkaline Phosphatase: 89 U/L (ref 38–126)
Anion gap: 9 (ref 5–15)
BUN: 14 mg/dL (ref 8–23)
CO2: 27 mmol/L (ref 22–32)
Calcium: 9 mg/dL (ref 8.9–10.3)
Chloride: 100 mmol/L (ref 98–111)
Creatinine: 0.89 mg/dL (ref 0.61–1.24)
GFR, Estimated: >60 mL/min (ref >60)
Glucose, Bld: 96 mg/dL (ref 70–99)
Potassium: 3.9 mmol/L (ref 3.5–5.1)
Sodium: 136 mmol/L (ref 135–145)
Total Bilirubin: 0.3 mg/dL (ref 0.3–1.2)
Total Protein: 8.2 g/dL — ABNORMAL HIGH (ref 6.5–8.1)

## 2021-09-23 LAB — SARS CORONAVIRUS 2 (TAT 6-24 HRS): SARS Coronavirus 2: NEGATIVE

## 2021-09-23 NOTE — Progress Notes (Signed)
[place]   Telephone:(336) [phone] Fax:(336) [phone]   Clinic Follow up Note   Patient Care Team: Pcp, No as PCP - General  Date of Service:  09/23/2021  I connected with Jorge [name] on 09/23/21 at  2:20 PM EST by telephone and verified that I am speaking with the correct person using two identifiers.   I discussed the limitations, risks, security and privacy concerns of performing an evaluation and management service by telephone and the availability of in person appointments. I also discussed with the patient that there may be a patient responsible charge related to this service. The patient expressed understanding and agreed to proceed.   Patient's location:  Home  Provider's location:  Office    CHIEF COMPLAINT: f/u of rectal cancer  CURRENT THERAPY:  Concurrent chemoRT with Xeloda, starting 09/06/21  ASSESSMENT & PLAN:  Jorge [name] is a 70 y.o. male with   1. 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 CT AP 07/23/21 showed rectal mass with no significant adenopathy or distant metastasis. Colonoscopy 08/19/21 showed a 7 cm infiltrative mass in the mid rectum (6-13cm from anal verge), pathology showed adenocarcinoma. MMR is normal -staging pelvic MRI 08/27/21 showed: staging at T4b; N2 likely associated with early extra mesorectal lymph node involvement; distance from tumor to internal anal sphincter is 1.2 cm; also with extramural venous involvement. -he began concurrent chemoRT with Xeloda on 09/06/21. He is tolerating well so far and is able to pass stool easier. -today's lab reviewed  -He will complete radiation next Monday    2. Symptom Management: Rectal pain, weight loss, fatigue -Secondary to rectal cancer  -he is on oxycodone and MS Contin, pain is controlled. -Dietitian referral; he did not show to 09/01/21 appointment   3. Mild anemia from rectal  bleeding, probable iron deficiency -He has started oral iron, tolerating well.  We will continue.   4. Pulmonary nodules -seen on chest CT 08/26/21 showing 2.2 cm RML nodule and four RUL nodules/masses measuring up to 3.6 cm. Findings concerning for metastatic primary lung cancer. -he met with Dr. Valeta Harms earlier today (09/14/21). He is tentatively scheduled for bronchoscopy on 09/27/21.   5. Multiple colon polyps -He has a large 3.5 cm polyps in the hepatic flexure, biopsy showed tubular adenoma.  Dr. [name] plan to repeat a colonoscopy in 6 months and remove it -He had additional 4 small polyps which were removed, path showed tubular adenomas.     PLAN:  -continue radiation and Xeloda M-F, plan to completed next Monday 11/21 -proceed with bronchoscopy 09/27/21 (or per Dr. Valeta Harms), I reviewed his appointments with him. -lab and f/u at 2pm on 11/28 to review biopsy results    No problem-specific Assessment & Plan notes found for this encounter.   SUMMARY OF ONCOLOGIC HISTORY: Oncology History Overview Note  Cancer Staging Rectal cancer [facility]) Staging form: Colon and Rectum, AJCC 8th Edition - Clinical stage from 08/29/2021: [name], cM1 - Signed by [name], MD on 08/29/2021    Rectal cancer ([place])  07/23/2021 Imaging   CT AP  IMPRESSION: 1. Appearance of the rectum likely indicates a rectal mass suspicious for rectal carcinoma. Inflammatory process would be less likely. Direct visualization is suggested. 2. Cholelithiasis without evidence of acute cholecystitis. 3. Aortic atherosclerosis.   08/19/2021 Procedure   Colonoscopy, under Dr. [name]  Impression: - Rectal tenderness, palpable rectal mass and hemorrhoids found on digital rectal exam. -  Stool in the entire examined colon - lavaged with still inadequate clearance. - One 35 mm polyp at the hepatic flexure. Biopsied. Tattooed distal in case future endoscopic resection is considered - however, has larger issues  with rectal mass currently as below. - Four, 5 to 11 mm polyps in the transverse colon and at the hepatic flexure, removed with a cold snare. Resected and retrieved. - Diverticulosis in the recto-sigmoid colon and in the sigmoid colon. - Rule out malignancy, partially obstructing tumor in the rectum and from 6 to 13 cm proximal to the anus. Biopsied.   08/19/2021 Pathology Results   Diagnosis 1. Hepatic Flexure Biopsy - TUBULAR ADENOMA WITHOUT HIGH-GRADE DYSPLASIA OR MALIGNANCY 2. Transverse Colon Biopsy, and hepatic flexure, polyps (4) - TUBULAR ADENOMA WITHOUT HIGH-GRADE DYSPLASIA OR MALIGNANCY - OTHER FRAGMENTS OF POLYPOID COLONIC MUCOSA WITH NO SPECIFIC HISTOPATHOLOGIC CHANGES - FOOD MATERIAL 3. Rectum, biopsy - ADENOCARCINOMA. SEE NOTE   08/22/2021 Initial Diagnosis   Rectal cancer ([place])   08/26/2021 Imaging   IMPRESSION: 1. A 2.2 x 1.9 cm right middle lobe pulmonary nodule as well as a total of four right upper lobe pulmonary nodule and masses measuring up to 3.6 cm. Findings concerning for metastatic primary lung cancer versus less likely metastases in a patient with rectal cancer. Additional imaging evaluation or consultation with Pulmonology or Thoracic Surgery recommended. 2. No gross hilar adenopathy, noting limited sensitivity for the detection of hilar adenopathy on this noncontrast study. 3. Cholelithiasis. 4.  Emphysema (ICD10-J43.9). 5. At least left anterior descending coronary artery calcifications.   08/27/2021 Imaging   IMPRESSION: Rectal adenocarcinoma T stage: T4 B   Rectal adenocarcinoma N stage: N2 disease likely associated with early extra mesorectal lymph node involvement.   Distance from tumor to the internal anal sphincter is 1.2 cm.   Also with extramural venous involvement as described.   08/29/2021 Cancer Staging   Staging form: Colon and Rectum, AJCC 8th Edition - Clinical stage from 08/29/2021: [name], cM1 - Signed by [name], MD on  08/29/2021 Stage prefix: Initial diagnosis       INTERVAL HISTORY:  [name] is scheduled for a office visit today, but he left without being seen.  I called him after clinic to check on him.  He is tolerating chemo and radiation very well, his rectal pain is well controlled with morphine twice daily, he is functioning better at home.  No other complaints.  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  ALLERGIES:  has No Known Allergies.  MEDICATIONS:  Current Outpatient Medications  Medication Sig Dispense Refill   Bisacodyl (LAXATIVE PO) Take by mouth.     capecitabine (XELODA) 500 MG tablet Take 3 tablets (1,500 mg total) by mouth 2 (two) times daily after a meal. Take with concurrent radiation, Monday through Friday, off on weekends 90 tablet 0   carbonyl iron (FEOSOL) 45 MG TABS tablet Take 45 mg by mouth.     hydrocortisone (ANUSOL-HC) 2.5 % rectal cream Place 1 application rectally 2 (two) times daily. 30 g 0   morphine (MS CONTIN) 15 MG 12 hr tablet Take 1 tablet (15 mg total) by mouth every 12 (twelve) hours.  30 tablet 0   ondansetron (ZOFRAN) 8 MG tablet Take 1 tablet (8 mg total) by mouth every 8 (eight) hours as needed for nausea or vomiting. 30 tablet 0   oxyCODONE (ROXICODONE) 5 MG immediate release tablet Take 1 tablet (5 mg total) by mouth every 8 (eight) hours as needed for severe pain. (Patient not taking: Reported on 09/14/2021) 90 tablet 0   Oxycodone HCl 10 MG TABS Take 1 tablet (10 mg total) by mouth every 4 (four) hours as needed. 60 tablet 0   prochlorperazine (COMPAZINE) 10 MG tablet Take 1 tablet (10 mg total) by mouth every 6 (six) hours as  needed for nausea or vomiting. (Patient not taking: Reported on 09/14/2021) 30 tablet 0   No current facility-administered medications for this visit.    PHYSICAL EXAMINATION: ECOG PERFORMANCE STATUS: 1 - Symptomatic but completely ambulatory  There were no vitals filed for this visit.  Wt Readings from Last 3 Encounters:  09/14/21 139 lb 4.8 oz (63.2 kg)  09/14/21 139 lb (63 kg)  09/06/21 142 lb 9.6 oz (64.7 kg)     GENERAL:alert, no distress and comfortable SKIN: skin color normal, no rashes or significant lesions EYES: normal, Conjunctiva are pink and non-injected, sclera clear  NEURO: alert & oriented x 3 with fluent speech  LABORATORY DATA:  I have reviewed the data as listed CBC Latest Ref Rng & Units 09/23/2021 09/14/2021 09/06/2021  WBC 4.0 - 10.5 K/uL 4.2 6.0 7.6  Hemoglobin 13.0 - 17.0 g/dL 12.8(L) 11.6(L) 12.2(L)  Hematocrit 39.0 - 52.0 % 40.5 36.2(L) 38.2(L)  Platelets 150 - 400 K/uL 286 360 459(H)     CMP Latest Ref Rng & Units 09/23/2021 09/14/2021 09/06/2021  Glucose 70 - 99 mg/dL 96 98 102(H)  BUN 8 - 23 mg/dL '14 14 9  ' Creatinine 0.61 - 1.24 mg/dL 0.89 0.86 0.83  Sodium 135 - 145 mmol/L 136 134(L) 137  Potassium 3.5 - 5.1 mmol/L 3.9 4.5 4.1  Chloride 98 - 111 mmol/L 100 99 101  CO2 22 - 32 mmol/L '27 27 28  ' Calcium 8.9 - 10.3 mg/dL 9.0 8.9 8.9  Total Protein 6.5 - 8.1 g/dL 8.2(H) 7.7 8.1  Total Bilirubin 0.3 - 1.2 mg/dL 0.3 0.4 0.4  Alkaline Phos 38 - 126 U/L 89 92 103  AST 15 - 41 U/L '31 21 18  ' ALT 0 - 44 U/L '26 16 14      ' RADIOGRAPHIC STUDIES: I have personally reviewed the radiological images as listed and agreed with the findings in the report. No results found.    No orders of the defined types were placed in this encounter.  I discussed the assessment and treatment plan with the patient. The patient was provided an opportunity to ask questions and all were answered. The patient agreed with the plan and demonstrated an understanding of the  instructions.   The patient was advised to call back or seek an in-person evaluation if the symptoms worsen or if the condition fails to improve as anticipated.  I provided 12 minutes of non face-to-face telephone visit time during this encounter, and > 50% was spent counseling as documented under my assessment & plan.    [name], MD 09/23/2021   I, Wilburn Mylar, am acting as scribe for [name], MD.   I have reviewed the above documentation for accuracy and completeness, and I agree with the above.

## 2021-09-26 ENCOUNTER — Other Ambulatory Visit: Payer: Self-pay | Admitting: Hematology

## 2021-09-26 ENCOUNTER — Encounter: Payer: Self-pay | Admitting: Hematology

## 2021-09-26 ENCOUNTER — Encounter: Payer: Self-pay | Admitting: Radiation Oncology

## 2021-09-26 ENCOUNTER — Ambulatory Visit
Admission: RE | Admit: 2021-09-26 | Discharge: 2021-09-26 | Disposition: A | Discharge status: Home / Self Care | Payer: Medicare Other | Source: Ambulatory Visit | Attending: Radiation Oncology | Admitting: Radiation Oncology

## 2021-09-26 ENCOUNTER — Other Ambulatory Visit: Payer: Self-pay

## 2021-09-26 ENCOUNTER — Encounter (HOSPITAL_COMMUNITY): Payer: Self-pay | Admitting: Pulmonary Disease

## 2021-09-26 DIAGNOSIS — C20 Malignant neoplasm of rectum: Secondary | ICD-10-CM | POA: Diagnosis not present

## 2021-09-26 MED ORDER — OXYCODONE HCL 10 MG PO TABS: 10.0000 mg | ORAL_TABLET | ORAL | 0 refills | Status: DC | PRN

## 2021-09-26 NOTE — Progress Notes (Signed)
                                                                                                                                                             Patient Name: Jorge [name] MRN: [id number] DOB: 01-21-1951 Referring Physician: Cerritos Copas Date of Service: 09/26/2021 [facility] [place]                                                        End Of Treatment Note  Diagnoses: C20-Malignant neoplasm of rectum  Cancer Staging: Stage IIIC, [phone], Adenocarcinoma of the rectum    Intent: Curative  Radiation Treatment Dates: 09/06/2021 through 09/26/2021 Site Technique Total Dose (Gy) Dose per Fx (Gy) Completed Fx Beam Energies  Rectum: Rectum 3D 37.5/37.5 2.5 15/15 10X, 15X   Narrative: The patient tolerated radiation therapy relatively well without complaints of fatigue or bowel or bladder dysfunction.  Plan: The patient will receive a call in about one month from the radiation oncology department. He will continue follow up with Dr. Marcello Moores and [name] as well.   ________________________________________________    Carola Rhine, [facility]

## 2021-09-26 NOTE — Progress Notes (Signed)
Spoke with pt's daughter, [name] for pre-op call. DPR on file. She states pt does not have a cardiac history, HTN or Diabetes. He is treated for cancer.   Covid test done on 09/23/21 and it is negative.

## 2021-09-27 ENCOUNTER — Ambulatory Visit (HOSPITAL_COMMUNITY)
Admission: RE | Admit: 2021-09-27 | Discharge: 2021-09-27 | Disposition: A | Discharge status: Home / Self Care | Payer: Medicare Other | Source: Ambulatory Visit | Attending: Pulmonary Disease | Admitting: Pulmonary Disease

## 2021-09-27 ENCOUNTER — Encounter (HOSPITAL_COMMUNITY): Payer: Self-pay | Admitting: Pulmonary Disease

## 2021-09-27 ENCOUNTER — Ambulatory Visit (HOSPITAL_COMMUNITY): Payer: Medicare Other

## 2021-09-27 ENCOUNTER — Ambulatory Visit (HOSPITAL_COMMUNITY): Payer: Medicare Other | Admitting: Certified Registered"

## 2021-09-27 ENCOUNTER — Other Ambulatory Visit: Payer: Self-pay

## 2021-09-27 ENCOUNTER — Encounter (HOSPITAL_COMMUNITY)
Admission: RE | Disposition: A | Discharge status: Home / Self Care | Payer: Self-pay | Source: Ambulatory Visit | Attending: Pulmonary Disease

## 2021-09-27 DIAGNOSIS — C20 Malignant neoplasm of rectum: Secondary | ICD-10-CM | POA: Diagnosis not present

## 2021-09-27 DIAGNOSIS — Z9889 Other specified postprocedural states: Secondary | ICD-10-CM

## 2021-09-27 DIAGNOSIS — F17210 Nicotine dependence, cigarettes, uncomplicated: Secondary | ICD-10-CM | POA: Diagnosis not present

## 2021-09-27 DIAGNOSIS — D649 Anemia, unspecified: Secondary | ICD-10-CM | POA: Insufficient documentation

## 2021-09-27 DIAGNOSIS — Z419 Encounter for procedure for purposes other than remedying health state, unspecified: Secondary | ICD-10-CM

## 2021-09-27 DIAGNOSIS — J432 Centrilobular emphysema: Secondary | ICD-10-CM | POA: Insufficient documentation

## 2021-09-27 DIAGNOSIS — Z923 Personal history of irradiation: Secondary | ICD-10-CM | POA: Insufficient documentation

## 2021-09-27 DIAGNOSIS — R911 Solitary pulmonary nodule: Secondary | ICD-10-CM | POA: Diagnosis not present

## 2021-09-27 DIAGNOSIS — C771 Secondary and unspecified malignant neoplasm of intrathoracic lymph nodes: Secondary | ICD-10-CM | POA: Diagnosis not present

## 2021-09-27 DIAGNOSIS — J984 Other disorders of lung: Secondary | ICD-10-CM | POA: Diagnosis present

## 2021-09-27 DIAGNOSIS — C7801 Secondary malignant neoplasm of right lung: Secondary | ICD-10-CM | POA: Diagnosis not present

## 2021-09-27 HISTORY — DX: Anemia, unspecified: D64.9

## 2021-09-27 HISTORY — PX: BRONCHIAL BRUSHINGS: SHX5108

## 2021-09-27 HISTORY — PX: BRONCHIAL BIOPSY: SHX5109

## 2021-09-27 HISTORY — PX: VIDEO BRONCHOSCOPY WITH RADIAL ENDOBRONCHIAL ULTRASOUND: SHX6849

## 2021-09-27 HISTORY — PX: BRONCHIAL NEEDLE ASPIRATION BIOPSY: SHX5106

## 2021-09-27 IMAGING — DX DG CHEST 1V PORT
1 series · 2 of 2 positions shown · non-contrast
Comparison: None

CLINICAL DATA: Post bronchoscopy with biopsy of RIGHT pulmonary
lesions.

EXAM:
PORTABLE CHEST 1 VIEW

[Series 1: chest · 0.14mm/px · 2 of 2 slices shown]
[im 1/2]
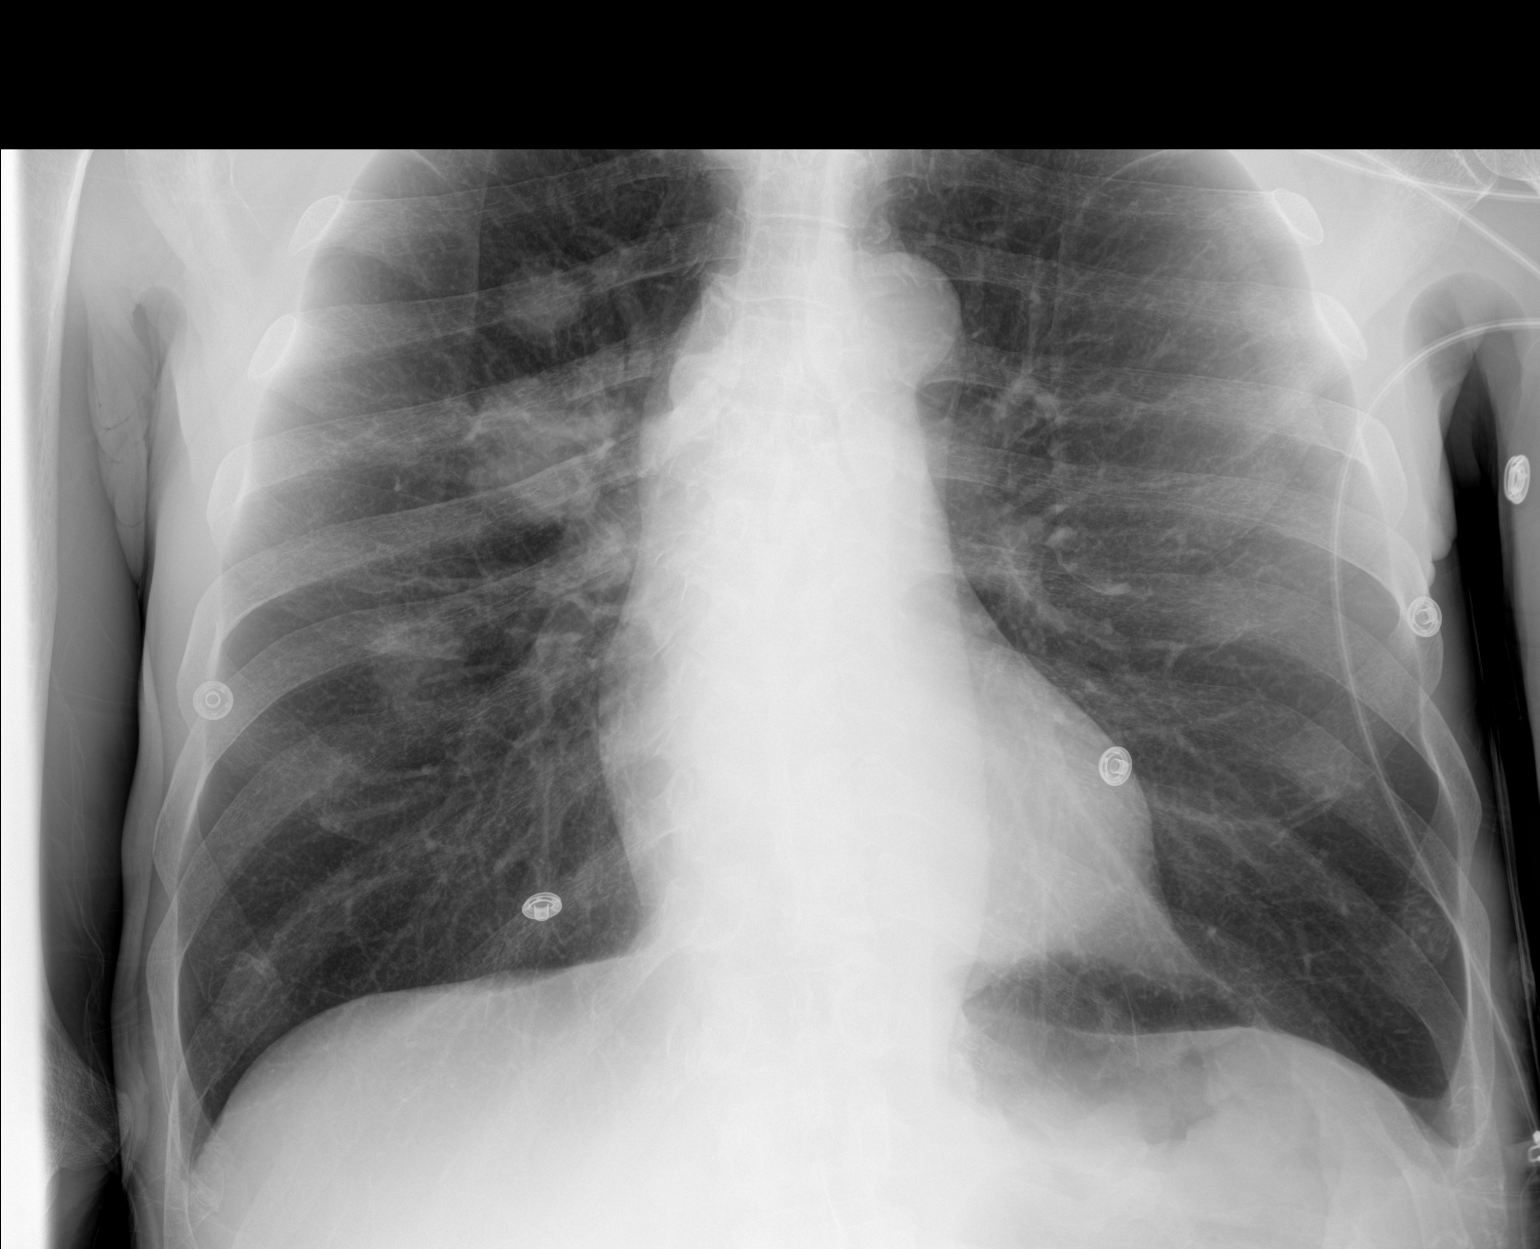
[im 2/2]
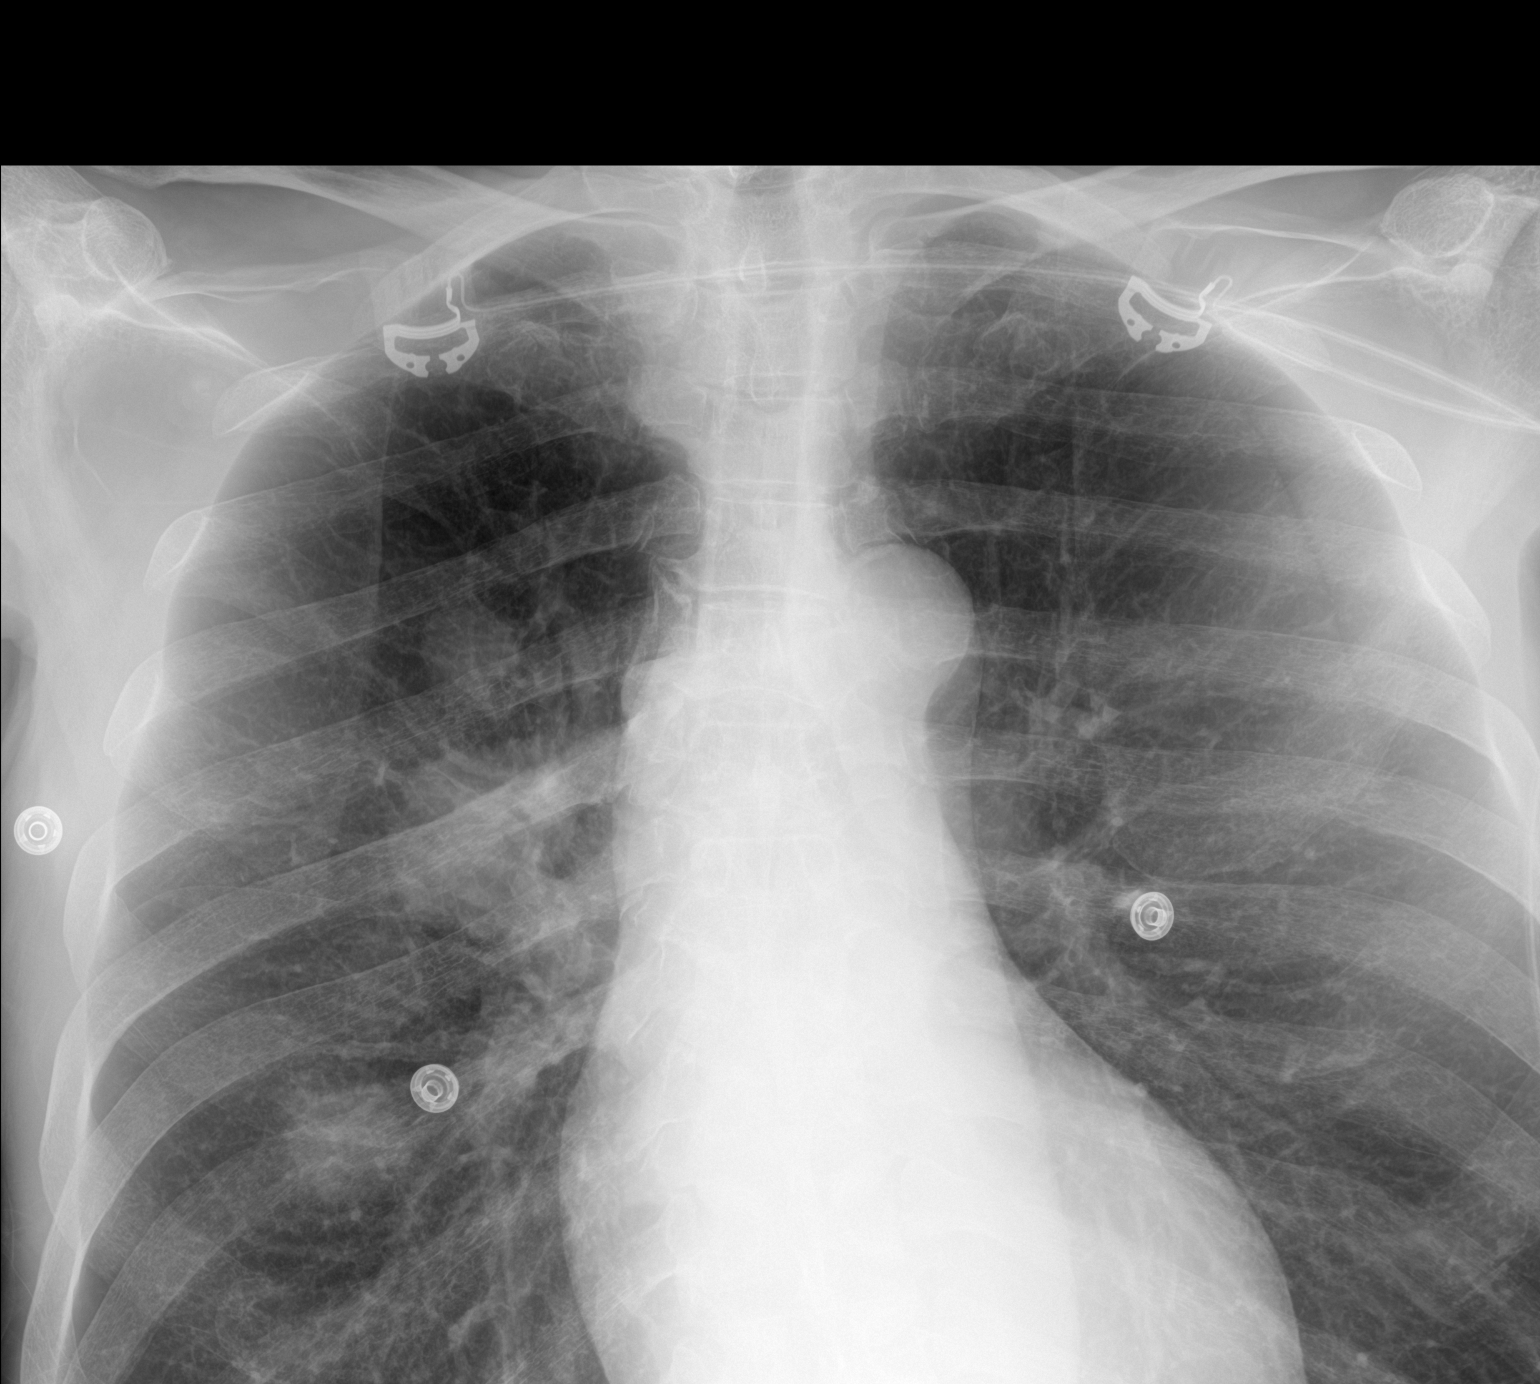

[2 of 2 positions shown; findings below may reference images not displayed]

FINDINGS: RIGHT upper lobe and middle lobe masses and nodules better
demonstrated on previous CT without gross change. EKG leads project
over the chest. No signs of pneumothorax. Skin folds noted along the
LEFT peripheral chest about the scapula.

Cardiomediastinal contours are stable.

On limited assessment there is no acute skeletal process.
IMPRESSION: No acute findings following biopsy, specifically no signs of
pneumothorax.

RIGHT upper lobe and middle lobe masses and nodules better
demonstrated on previous CT without gross change.

## 2021-09-27 SURGERY — BRONCHOSCOPY, WITH BIOPSY USING ELECTROMAGNETIC NAVIGATION
Anesthesia: General | Laterality: Left

## 2021-09-27 MED ORDER — LACTATED RINGERS IV SOLN: INTRAVENOUS | Status: DC

## 2021-09-27 MED ORDER — LIDOCAINE 2% (20 MG/ML) 5 ML SYRINGE
INTRAMUSCULAR | Status: DC | PRN
  Administered 2021-09-27: 60 mg via INTRAVENOUS

## 2021-09-27 MED ORDER — ONDANSETRON HCL 4 MG/2ML IJ SOLN
INTRAMUSCULAR | Status: DC | PRN
  Administered 2021-09-27: 4 mg via INTRAVENOUS

## 2021-09-27 MED ORDER — PROPOFOL 10 MG/ML IV BOLUS
INTRAVENOUS | Status: DC | PRN
  Administered 2021-09-27: 110 mg via INTRAVENOUS

## 2021-09-27 MED ORDER — FENTANYL CITRATE (PF) 100 MCG/2ML IJ SOLN: 25.0000 ug | INTRAMUSCULAR | Status: DC | PRN

## 2021-09-27 MED ORDER — ROCURONIUM BROMIDE 10 MG/ML (PF) SYRINGE
PREFILLED_SYRINGE | INTRAVENOUS | Status: DC | PRN
  Administered 2021-09-27: 50 mg via INTRAVENOUS

## 2021-09-27 MED ORDER — ACETAMINOPHEN 500 MG PO TABS
1000.0000 mg | ORAL_TABLET | Freq: Once | ORAL | Status: AC
  Administered 2021-09-27: 1000 mg via ORAL
  Filled 2021-09-27: qty 2

## 2021-09-27 MED ORDER — PHENYLEPHRINE 40 MCG/ML (10ML) SYRINGE FOR IV PUSH (FOR BLOOD PRESSURE SUPPORT)
PREFILLED_SYRINGE | INTRAVENOUS | Status: DC | PRN
  Administered 2021-09-27 (×2): 120 ug via INTRAVENOUS
  Administered 2021-09-27: 80 ug via INTRAVENOUS
  Administered 2021-09-27: 120 ug via INTRAVENOUS

## 2021-09-27 MED ORDER — SUGAMMADEX SODIUM 200 MG/2ML IV SOLN
INTRAVENOUS | Status: DC | PRN
  Administered 2021-09-27: 200 mg via INTRAVENOUS

## 2021-09-27 MED ORDER — FENTANYL CITRATE (PF) 250 MCG/5ML IJ SOLN
INTRAMUSCULAR | Status: DC | PRN
  Administered 2021-09-27: 100 ug via INTRAVENOUS

## 2021-09-27 MED ORDER — CHLORHEXIDINE GLUCONATE 0.12 % MT SOLN
15.0000 mL | Freq: Once | OROMUCOSAL | Status: AC
  Administered 2021-09-27: 15 mL via OROMUCOSAL
  Filled 2021-09-27: qty 15

## 2021-09-27 MED ORDER — DEXAMETHASONE SODIUM PHOSPHATE 10 MG/ML IJ SOLN
INTRAMUSCULAR | Status: DC | PRN
Start: 2021-09-27 — End: 2021-09-27
  Administered 2021-09-27: 10 mg via INTRAVENOUS

## 2021-09-27 NOTE — Anesthesia Procedure Notes (Signed)
Procedure Name: Intubation Date/Time: 09/27/2021 12:09 PM Performed by: Lance Coon, CRNA Pre-anesthesia Checklist: Patient identified, Emergency Drugs available, Suction available, Patient being monitored and Timeout performed Patient Re-evaluated:Patient Re-evaluated prior to induction Oxygen Delivery Method: Circle system utilized Preoxygenation: Pre-oxygenation with 100% oxygen Induction Type: IV induction Ventilation: Mask ventilation without difficulty and Oral airway inserted - appropriate to patient size Laryngoscope Size: Miller and 3 Grade View: Grade I Tube type: Oral Tube size: 8.5 mm Number of attempts: 1 Airway Equipment and Method: Stylet Placement Confirmation: ETT inserted through vocal cords under direct vision, positive ETCO2 and breath sounds checked- equal and bilateral Secured at: 21 cm Tube secured with: Tape Dental Injury: Teeth and Oropharynx as per pre-operative assessment

## 2021-09-27 NOTE — Interval H&P Note (Signed)
History and Physical Interval Note:  09/27/2021 11:51 AM  Jorge [name]  has presented today for surgery, with the diagnosis of LUNG NODULES.  The various methods of treatment have been discussed with the patient and family. After consideration of risks, benefits and other options for treatment, the patient has consented to  Procedure(s) with comments: ROBOTIC ASSISTED NAVIGATIONAL BRONCHOSCOPY (Left) - ion W/ cios as a surgical intervention.  The patient's history has been reviewed, patient examined, no change in status, stable for surgery.  I have reviewed the patient's chart and labs.  Questions were answered to the patient's satisfaction.     [place]

## 2021-09-27 NOTE — Anesthesia Postprocedure Evaluation (Signed)
Anesthesia Post Note  Patient: Jorge [name]  Procedure(s) Performed: ROBOTIC ASSISTED NAVIGATIONAL BRONCHOSCOPY (Left) RADIAL ENDOBRONCHIAL ULTRASOUND BRONCHIAL BIOPSIES BRONCHIAL BRUSHINGS BRONCHIAL NEEDLE ASPIRATION BIOPSIES     Patient location during evaluation: PACU Anesthesia Type: General Level of consciousness: awake and alert Pain management: pain level controlled Vital Signs Assessment: post-procedure vital signs reviewed and stable Respiratory status: spontaneous breathing, nonlabored ventilation and respiratory function stable Cardiovascular status: blood pressure returned to baseline and stable Postop Assessment: no apparent nausea or vomiting Anesthetic complications: no   No notable events documented.  Last Vitals:  Vitals:   09/27/21 1345 09/27/21 1400  BP: (!) 147/71 (!) 142/77  Pulse: 92 81  Resp: 14 20  Temp:    SpO2: 97% 98%    Last Pain:  Vitals:   09/27/21 1400  TempSrc:   PainSc: 0-No pain                 Sherleen Pangborn,W. EDMOND

## 2021-09-27 NOTE — Anesthesia Preprocedure Evaluation (Addendum)
Anesthesia Evaluation  Patient identified by MRN, date of birth, ID band Patient awake    Reviewed: Allergy & Precautions, H&P , NPO status , Patient's Chart, lab work & pertinent test results  Airway Mallampati: II  TM Distance: >3 FB Neck ROM: Full    Dental no notable dental hx. (+) Edentulous Upper, Partial Lower, Dental Advisory Given   Pulmonary neg pulmonary ROS, Current Smoker and Patient abstained from smoking.,    Pulmonary exam normal breath sounds clear to auscultation       Cardiovascular negative cardio ROS   Rhythm:Regular Rate:Normal     Neuro/Psych negative neurological ROS  negative psych ROS   GI/Hepatic negative GI ROS, Neg liver ROS,   Endo/Other  negative endocrine ROS  Renal/GU negative Renal ROS  negative genitourinary   Musculoskeletal   Abdominal   Peds  Hematology  (+) Blood dyscrasia, anemia ,   Anesthesia Other Findings   Reproductive/Obstetrics negative OB ROS                            Anesthesia Physical Anesthesia Plan  ASA: 2  Anesthesia Plan: General   Post-op Pain Management: Tylenol PO (pre-op) and Minimal or no pain anticipated   Induction: Intravenous  PONV Risk Score and Plan: 2 and Ondansetron, Dexamethasone and Treatment may vary due to age or medical condition  Airway Management Planned: Oral ETT  Additional Equipment:   Intra-op Plan:   Post-operative Plan: Extubation in OR  Informed Consent: I have reviewed the patients History and Physical, chart, labs and discussed the procedure including the risks, benefits and alternatives for the proposed anesthesia with the patient or authorized representative who has indicated his/her understanding and acceptance.     Dental advisory given  Plan Discussed with: CRNA  Anesthesia Plan Comments:         Anesthesia Quick Evaluation

## 2021-09-27 NOTE — Transfer of Care (Signed)
Immediate Anesthesia Transfer of Care Note  Patient: Jorge [name]  Procedure(s) Performed: ROBOTIC ASSISTED NAVIGATIONAL BRONCHOSCOPY (Left) RADIAL ENDOBRONCHIAL ULTRASOUND BRONCHIAL BIOPSIES BRONCHIAL BRUSHINGS  Patient Location: PACU  Anesthesia Type:General  Level of Consciousness: drowsy and patient cooperative  Airway & Oxygen Therapy: Patient Spontanous Breathing  Post-op Assessment: Report given to RN and Post -op Vital signs reviewed and stable  Post vital signs: Reviewed and stable  Last Vitals:  Vitals Value Taken Time  BP 150/85 09/27/21 1329  Temp    Pulse 90 09/27/21 1331  Resp 19 09/27/21 1331  SpO2 96 % 09/27/21 1331  Vitals shown include unvalidated device data.  Last Pain:  Vitals:   09/27/21 1055  TempSrc:   PainSc: 0-No pain         Complications: No notable events documented.

## 2021-09-27 NOTE — Interval H&P Note (Signed)
History and Physical Interval Note:  09/27/2021 11:31 AM  Jorge [name]  has presented today for surgery, with the diagnosis of LUNG NODULES.  The various methods of treatment have been discussed with the patient and family. After consideration of risks, benefits and other options for treatment, the patient has consented to  Procedure(s) with comments: ROBOTIC ASSISTED NAVIGATIONAL BRONCHOSCOPY (Left) - ion W/ cios as a surgical intervention.  The patient's history has been reviewed, patient examined, no change in status, stable for surgery.  I have reviewed the patient's chart and labs.  Questions were answered to the patient's satisfaction.     [place]

## 2021-09-27 NOTE — Discharge Instructions (Signed)
Flexible Bronchoscopy, Care After This sheet gives you information about how to care for yourself after your test. Your doctor may also give you more specific instructions. If you have problems or questions, contact your doctor. Follow these instructions at home: Eating and drinking Do not eat or drink anything (not even water) for 2 hours after your test, or until your numbing medicine (local anesthetic) wears off. When your numbness is gone and your cough and gag reflexes have come back, you may: Eat only soft foods. Slowly drink liquids. The day after the test, go back to your normal diet. Driving Do not drive for 24 hours if you were given a medicine to help you relax (sedative). Do not drive or use heavy machinery while taking prescription pain medicine. General instructions  Take over-the-counter and prescription medicines only as told by your doctor. Return to your normal activities as told. Ask what activities are safe for you. Do not use any products that have nicotine or tobacco in them. This includes cigarettes and e-cigarettes. If you need help quitting, ask your doctor. Keep all follow-up visits as told by your doctor. This is important. It is very important if you had a tissue sample (biopsy) taken. Get help right away if: You have shortness of breath that gets worse. You get light-headed. You feel like you are going to pass out (faint). You have chest pain. You cough up: More than a little blood. More blood than before. Summary Do not eat or drink anything (not even water) for 2 hours after your test, or until your numbing medicine wears off. Do not use cigarettes. Do not use e-cigarettes. Get help right away if you have chest pain.  This information is not intended to replace advice given to you by your health care provider. Make sure you discuss any questions you have with your health care provider. Document Released: 08/20/2009 Document Revised: 10/05/2017 Document  Reviewed: 11/10/2016 Elsevier Patient Education  2020 [employer].

## 2021-09-27 NOTE — Op Note (Signed)
Video Bronchoscopy with Robotic Assisted Bronchoscopic Navigation   Date of Operation: 09/27/2021   Pre-op Diagnosis: Right lung masses, history of rectal cancer  Post-op Diagnosis: Right lung masses, history of rectal cancer  Surgeon: Garner Nash, DO   Assistants: None   Anesthesia: General endotracheal anesthesia  Operation: Flexible video fiberoptic bronchoscopy with robotic assistance and biopsies.  Estimated Blood Loss: Minimal  Complications: None  Indications and History: [name] is a 70 y.o. male with history of right lung mass, history of rectal cancer. The risks, benefits, complications, treatment options and expected outcomes were discussed with the patient.  The possibilities of pneumothorax, pneumonia, reaction to medication, pulmonary aspiration, perforation of a viscus, bleeding, failure to diagnose a condition and creating a complication requiring transfusion or operation were discussed with the patient who freely signed the consent.    Description of Procedure: The patient was seen in the Preoperative Area, was examined and was deemed appropriate to proceed.  The patient was taken to [facility] endoscopy room 3, identified as [name] and the procedure verified as Flexible Video Fiberoptic Bronchoscopy.  A Time Out was held and the above information confirmed.   Prior to the date of the procedure a high-resolution CT scan of the chest was performed. Utilizing ION software program a virtual tracheobronchial tree was generated to allow the creation of distinct navigation pathways to the patient's parenchymal abnormalities. After being taken to the operating room general anesthesia was initiated and the patient  was orally intubated. The video fiberoptic bronchoscope was introduced via the endotracheal tube and a general inspection was performed which showed normal right and left lung anatomy, aspiration of the bilateral mainstems was completed to remove any  remaining secretions. Robotic catheter inserted into patient's endotracheal tube.   Target #1 right upper lobe: The distinct navigation pathways prepared prior to this procedure were then utilized to navigate to patient's lesion identified on CT scan. The robotic catheter was secured into place and the vision probe was withdrawn.  Lesion location was approximated using fluoroscopy, radial endobronchial ultrasound, and a three-dimensional cone beam CT imaging for peripheral targeting. Under fluoroscopic guidance transbronchial needle brushings, transbronchial needle biopsies, and transbronchial forceps biopsies were performed to be sent for cytology and pathology.   Target #2 right middle lobe: The distinct navigation pathways prepared prior to this procedure were then utilized to navigate to patient's lesion identified on CT scan. The robotic catheter was secured into place and the vision probe was withdrawn.  Lesion location was approximated using fluoroscopy, radial endobronchial ultrasound, and three-dimensional cone beam CT imaging for peripheral targeting. Under fluoroscopic guidance transbronchial needle brushings, transbronchial needle biopsies, and transbronchial forceps biopsies were performed to be sent for cytology and pathology.  At the end of the procedure a general airway inspection was performed and there was no evidence of active bleeding. The bronchoscope was removed.  The patient tolerated the procedure well. There was no significant blood loss and there were no obvious complications. A post-procedural chest x-ray is pending.  Samples Target #1: 1. Transbronchial needle brushings from right upper lobe 2. Transbronchial Wang needle biopsies from right upper lobe 3. Transbronchial forceps biopsies from right upper lobe  Samples Target #2: 1. Transbronchial needle brushings from right middle lobe 2. Transbronchial Wang needle biopsies from right middle lobe 3. Transbronchial forceps  biopsies from right middle lobe  Plans:  The patient will be discharged from the PACU to home when recovered from anesthesia and after chest x-ray is  reviewed. We will review the cytology, pathology and microbiology results with the patient when they become available. Outpatient followup will be with Garner Nash, DO.   Garner Nash, DO [facility] 09/27/2021 1:34 PM

## 2021-09-30 ENCOUNTER — Encounter (HOSPITAL_COMMUNITY): Payer: Self-pay | Admitting: Pulmonary Disease

## 2021-10-01 ENCOUNTER — Other Ambulatory Visit: Payer: Self-pay | Admitting: Hematology

## 2021-10-02 ENCOUNTER — Encounter: Payer: Self-pay | Admitting: Hematology

## 2021-10-02 ENCOUNTER — Other Ambulatory Visit: Payer: Self-pay | Admitting: Hematology

## 2021-10-03 ENCOUNTER — Other Ambulatory Visit: Payer: Self-pay | Admitting: Hematology

## 2021-10-03 ENCOUNTER — Other Ambulatory Visit: Payer: Self-pay

## 2021-10-03 ENCOUNTER — Other Ambulatory Visit (HOSPITAL_COMMUNITY): Payer: Self-pay | Admitting: General Surgery

## 2021-10-03 ENCOUNTER — Other Ambulatory Visit: Payer: Self-pay | Admitting: General Surgery

## 2021-10-03 ENCOUNTER — Encounter: Payer: Self-pay | Admitting: Acute Care

## 2021-10-03 ENCOUNTER — Ambulatory Visit (INDEPENDENT_AMBULATORY_CARE_PROVIDER_SITE_OTHER): Payer: Medicare Other | Admitting: Acute Care

## 2021-10-03 ENCOUNTER — Ambulatory Visit: Payer: Medicare Other | Admitting: Hematology

## 2021-10-03 VITALS — BP 126/74 | HR 92 | Temp 98.4°F | Ht 71.0 in | Wt 138.2 lb

## 2021-10-03 DIAGNOSIS — C20 Malignant neoplasm of rectum: Secondary | ICD-10-CM

## 2021-10-03 DIAGNOSIS — R918 Other nonspecific abnormal finding of lung field: Secondary | ICD-10-CM | POA: Diagnosis not present

## 2021-10-03 DIAGNOSIS — F17210 Nicotine dependence, cigarettes, uncomplicated: Secondary | ICD-10-CM

## 2021-10-03 DIAGNOSIS — R634 Abnormal weight loss: Secondary | ICD-10-CM

## 2021-10-03 DIAGNOSIS — Z720 Tobacco use: Secondary | ICD-10-CM

## 2021-10-03 MED ORDER — OXYCODONE HCL 10 MG PO TABS
10.0000 mg | ORAL_TABLET | ORAL | 0 refills | Status: DC | PRN
Start: 2021-10-03 — End: 2021-10-13

## 2021-10-03 MED ORDER — MORPHINE SULFATE ER 15 MG PO TBCR
15.0000 mg | EXTENDED_RELEASE_TABLET | Freq: Two times a day (BID) | ORAL | 0 refills | Status: DC
Start: 2021-10-03 — End: 2021-10-27

## 2021-10-03 NOTE — Patient Instructions (Addendum)
It is good to see you today. We will call you with your biopsy results when we get them.  Please work on quitting smoking. 1-800- QUIT NOW for free nicotine patches gum or mints.  This is very important for your overall health. Follow up in 2 months to make sure you are doing well.  Continue to work on weight gain, with vitamins and Ensure.  Please contact office for sooner follow up if symptoms do not improve or worsen or seek emergency care

## 2021-10-03 NOTE — Progress Notes (Signed)
History of Present Illness Jorge [name] is a 70 y.o. male current every day smoker with history of  rectal cancer, and new findings of lung nodules concerning for primary lung cancer vs metastatic rectal cancer. He had a  Video Bronchoscopy with Robotic Assisted navigational bronch on 09/27/2021 by Dr. Valeta Harms.   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  This was diagnosed in October 2022.  At this time he had a CT scan of the chest abdomen pelvis for staging which showed upper lobe pulmonary nodules within the left side.  Largest greater than 2 cm concerning for primary lung cancer versus metastatic disease.  Patient underwent biopsy of the rectal lesion and has already started radiation treatments.  Patient was referred to [facility] for evaluation of abnormalities within the chest and for consideration of tissue biopsy of the lung lesion.  He is a former smoker.   10/03/2021 Pt. Presents for follow up after navigational bronchoscopy on 09/27/2021. His biopsy results are still pending upon chart review this morning.   He states he did well after the procedure. He states he had one episode where he coughed up a small amount of blood. It was self limiting.Marland Kitchen He denies any sore throat, or any other problems. He is very concerned and is anxious to know the results of his biopsies. He is working on gaining weight.   Test Results: Chest Imaging: 08/26/2021 CT chest: Known rectal mass male with upper lobe nodules within the left side concerning for malignancy within the left lung.  Tissue Sampling  09/27/2021 Samples Target #1: 1. Transbronchial needle brushings from right upper lobe 2. Transbronchial Wang needle biopsies from right upper lobe 3. Transbronchial forceps biopsies from right upper lobe   Samples Target #2: 1. Transbronchial needle brushings from right middle lobe 2. Transbronchial Wang needle  biopsies from right middle lobe 3. Transbronchial forceps biopsies from right middle lobe    09/27/2021 Cytology  Pending results   CBC Latest Ref Rng & Units 09/23/2021 09/14/2021 09/06/2021  WBC 4.0 - 10.5 K/uL 4.2 6.0 7.6  Hemoglobin 13.0 - 17.0 g/dL 12.8(L) 11.6(L) 12.2(L)  Hematocrit 39.0 - 52.0 % 40.5 36.2(L) 38.2(L)  Platelets 150 - 400 K/uL 286 360 459(H)    BMP Latest Ref Rng & Units 09/23/2021 09/14/2021 09/06/2021  Glucose 70 - 99 mg/dL 96 98 102(H)  BUN 8 - 23 mg/dL 14 14 9   Creatinine 0.61 - 1.24 mg/dL 0.89 0.86 0.83  Sodium 135 - 145 mmol/L 136 134(L) 137  Potassium 3.5 - 5.1 mmol/L 3.9 4.5 4.1  Chloride 98 - 111 mmol/L 100 99 101  CO2 22 - 32 mmol/L 27 27 28   Calcium 8.9 - 10.3 mg/dL 9.0 8.9 8.9    BNP No results found for: BNP  ProBNP No results found for: PROBNP  PFT No results found for: FEV1PRE, FEV1POST, FVCPRE, FVCPOST, TLC, DLCOUNC, PREFEV1FVCRT, PSTFEV1FVCRT  DG CHEST PORT 1 VIEW  Result Date: 09/27/2021 CLINICAL DATA:  Post bronchoscopy with biopsy of RIGHT pulmonary lesions. EXAM: PORTABLE CHEST 1 VIEW COMPARISON:  None FINDINGS: RIGHT upper lobe and middle lobe masses and nodules better demonstrated on previous CT without gross change. EKG leads project over the chest. No signs of pneumothorax. Skin folds noted along the LEFT peripheral chest about the scapula. Cardiomediastinal contours are stable. On limited assessment there is no acute skeletal process. IMPRESSION: No acute findings following biopsy, specifically no signs of pneumothorax. RIGHT upper lobe and  middle lobe masses and nodules better demonstrated on previous CT without gross change. Electronically Signed   By: Zetta Bills M.D.   On: 09/27/2021 14:27   DG C-ARM BRONCHOSCOPY  Result Date: 09/27/2021 C-ARM BRONCHOSCOPY: Fluoroscopy was utilized by the requesting physician.  No radiographic interpretation.     Past medical hx Past Medical History:  Diagnosis Date   Anemia     Rectal adenocarcinoma metastatic to intrapelvic lymph node ([place]) 08/19/2021     Social History   Tobacco Use   Smoking status: Some Days    Packs/day: 0.25    Years: 20.00    Pack years: 5.00    Types: Cigarettes   Smokeless tobacco: Never  Substance Use Topics   Alcohol use: Not Currently    Comment: 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, stopped in 02/2021   Drug use: Never    Mr.Jorge [name] reports that he has been smoking cigarettes. He has a 5.00 pack-year smoking history. He has never used smokeless tobacco. He reports that he does not currently use alcohol. He reports that he does not use drugs.  Tobacco Cessation: Current Every Day Smoker , trying to quit  Past surgical hx, Family hx, Social hx all reviewed.  Current Outpatient Medications on File Prior to Visit  Medication Sig   capecitabine (XELODA) 500 MG tablet Take 3 tablets (1,500 mg total) by mouth 2 (two) times daily after a meal. Take with concurrent radiation, Monday through Friday, off on weekends   carbonyl iron (FEOSOL) 45 MG TABS tablet Take 45 mg by mouth daily.   loperamide (IMODIUM A-D) 2 MG tablet Take 4 mg by mouth 4 (four) times daily as needed for diarrhea or loose stools.   morphine (MS CONTIN) 15 MG 12 hr tablet Take 1 tablet (15 mg total) by mouth every 12 (twelve) hours. (Patient taking differently: Take 15 mg by mouth every 12 (twelve) hours as needed for pain.)   Multiple Vitamin (MULTIVITAMIN WITH MINERALS) TABS tablet Take 1 tablet by mouth daily.   ondansetron (ZOFRAN) 8 MG tablet Take 1 tablet (8 mg total) by mouth every 8 (eight) hours as needed for nausea or vomiting.   Oxycodone HCl 10 MG TABS Take 1 tablet (10 mg total) by mouth every 4 (four) hours as needed.   prochlorperazine (COMPAZINE) 10 MG tablet Take 1 tablet (10 mg total) by mouth every 6 (six) hours as needed for nausea or vomiting.   No current facility-administered medications on file prior to visit.     No Known  Allergies  Review Of Systems:  Constitutional:   No  weight loss, night sweats,  Fevers, chills, fatigue, or  lassitude.  HEENT:   No headaches,  Difficulty swallowing,  Tooth/dental problems, or  Sore throat,                No sneezing, itching, ear ache, nasal congestion, post nasal drip,   CV:  No chest pain,  Orthopnea, PND, swelling in lower extremities, anasarca, dizziness, palpitations, syncope.   GI  No heartburn, indigestion, abdominal pain, nausea, vomiting, diarrhea, change in bowel habits, loss of appetite, bloody stools.   Resp: No shortness of breath with exertion or at rest.  No excess mucus, no productive cough,  No non-productive cough,  No coughing up of blood.  No change in color of mucus.  No wheezing.  No chest wall deformity  Skin: no rash or lesions.  GU: no dysuria, change in color of urine, no urgency  or frequency.  No flank pain, no hematuria   MS:  No joint pain or swelling.  No decreased range of motion.  No back pain.  Psych:  No change in mood or affect. No depression or + anxiety.  No memory loss.   Vital Signs BP 126/74 (BP Location: Left Arm, Patient Position: Sitting, Cuff Size: Normal)   Pulse 92   Temp 98.4 F (36.9 C) (Oral)   Ht 5\' 11"  (1.803 m)   Wt 138 lb 3.2 oz (62.7 kg)   SpO2 98%   BMI 19.27 kg/m    Physical Exam:  General- No distress,  A&Ox3, pleasant and anxious ENT: No sinus tenderness, TM clear, pale nasal mucosa, no oral exudate,no post nasal drip, no LAN Cardiac: S1, S2, regular rate and rhythm, no murmur Chest: No wheeze/ rales/ dullness; no accessory muscle use, no nasal flaring, no sternal retractions Abd.: Soft Non-tender,ND, BS +, Body mass index is 19.27 kg/m. Ext: No clubbing cyanosis, edema Neuro:  normal strength, MAE x 4, A&O x 3, appropriate Skin: No rashes, warm and dry, No lesions Psych: normal mood , anxious   Assessment/Plan Left Lung nodules concerning for primary lung cancer vs metastatic rectal  cancer. Cytology results of navigational bronch are pending Plan We will call the patient with the results of his Biopsies once they have resulted  We will refer to Medical Oncology/ Radiation oncology as is appropriate based on results  Continued tobacco abuse Plan Please work on quitting smoking. 1-800- QUIT NOW for free nicotine patches gum or mints.  This is very important for your overall health. Follow up in 2 months to make sure you are doing well.   History of Weight Loss Plan Continue to work on weight gain, with vitamins and Ensure.  Please contact office for sooner follow up if symptoms do not improve or worsen or seek emergency care    I spent 30 minutes dedicated to the care of this patient on the date of this encounter to include pre-visit review of records, face-to-face time with the patient discussing conditions above, post visit ordering of testing, clinical documentation with the electronic health record, making appropriate referrals as documented, and communicating necessary information to the patient's healthcare team.   Magdalen Spatz, NP 10/03/2021  10:39 AM

## 2021-10-04 ENCOUNTER — Other Ambulatory Visit (HOSPITAL_COMMUNITY): Payer: Self-pay | Admitting: General Surgery

## 2021-10-04 ENCOUNTER — Ambulatory Visit (HOSPITAL_COMMUNITY)
Admission: RE | Admit: 2021-10-04 | Discharge: 2021-10-04 | Disposition: A | Discharge status: Home / Self Care | Payer: Medicare Other | Source: Ambulatory Visit | Attending: General Surgery | Admitting: General Surgery

## 2021-10-04 DIAGNOSIS — C20 Malignant neoplasm of rectum: Secondary | ICD-10-CM | POA: Insufficient documentation

## 2021-10-04 LAB — CYTOLOGY - NON PAP

## 2021-10-04 IMAGING — MR MR PELVIS WO/W CM
17 of 18 series · 45 of 48 positions shown · IV contrast (7ml GADAVIST)
Comparison: Comparison is made with MR evaluation from [DATE].

CLINICAL DATA: History of rectal cancer with large rectal mass and
adenopathy previously evaluated with MRI.

EXAM:
MRI PELVIS WITHOUT AND WITH CONTRAST
TECHNIQUE: Multiplanar multisequence MR imaging of the pelvis was performed
both before and after administration of intravenous contrast.
Ultrasound gel was administered per rectum to optimize tumor
evaluation.
CONTRAST:  6mL GADAVIST GADOBUTROL 1 MMOL/ML IV SOLN

[Series 2: T2 · sagittal · 3.0mm · 0.68mm/px · 3 of 53 slices shown (1 of 5)]
[im 1/53]
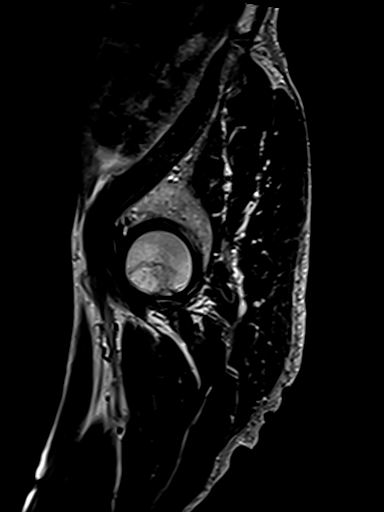
[im 27/53]
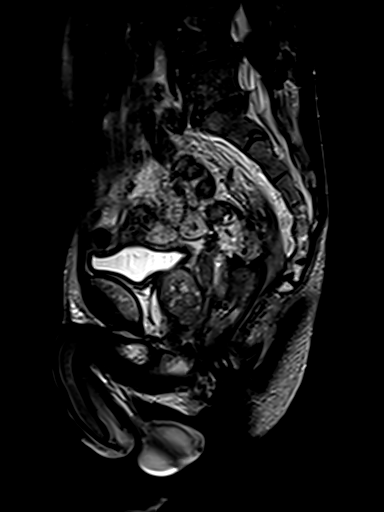
[im 53/53]
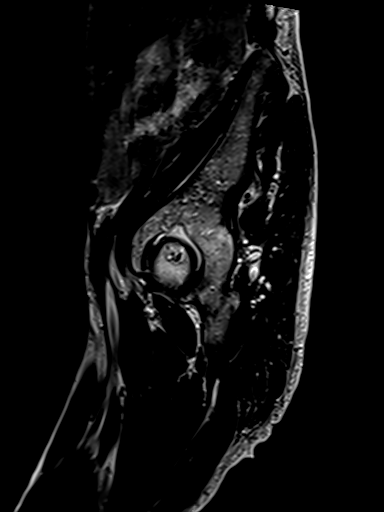

[Series 3: T2 · axial · 5.0mm · 0.89mm/px · z∈[-164,+106]mm · 3 of 46 slices shown (2 of 5)]
[im 1/46]
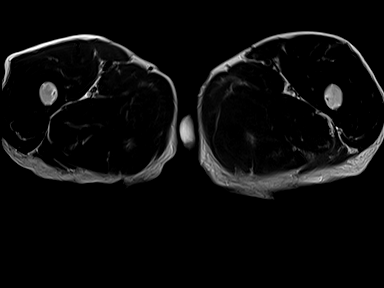
[im 23/46]
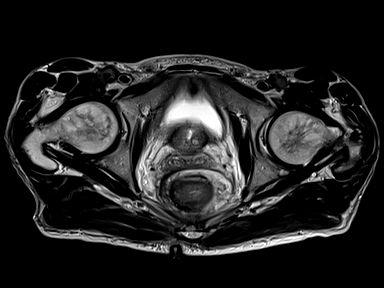
[im 46/46]
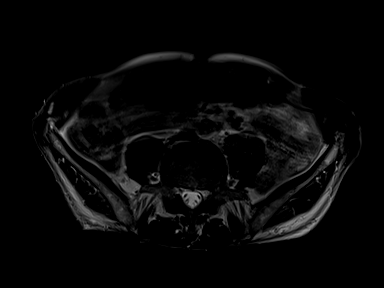

[Series 4: T2 · coronal · 3.0mm · 0.74mm/px · 2 of 55 slices shown (3 of 5)]
[im 1/55]
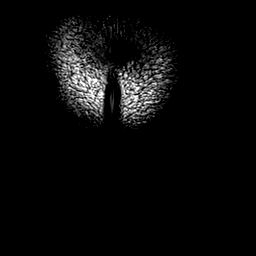
[im 55/55]
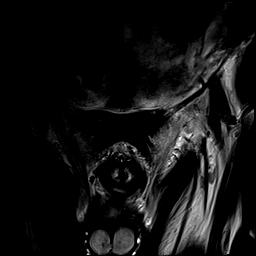

[Series 5: T2 · axial · 3.0mm · 0.70mm/px · z∈[-120,+39]mm · 2 of 54 slices shown (4 of 5)]
[im 1/54]
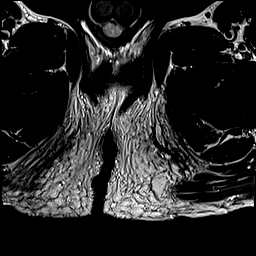
[im 54/54]
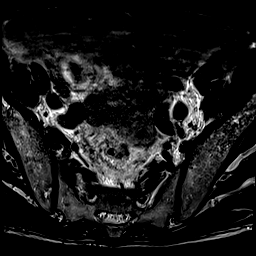

[Series 6: T2 · coronal · 3.0mm · 0.70mm/px · 2 of 44 slices shown (5 of 5)]
[im 1/44]
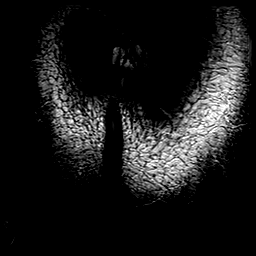
[im 44/44]
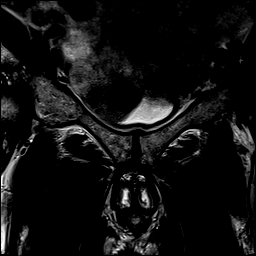

[Series 7: DWI · axial · 5.0mm · 1.35mm/px · z∈[-142,+122]mm · 3 of 90 slices shown (1 of 2)]
[im 1/90]
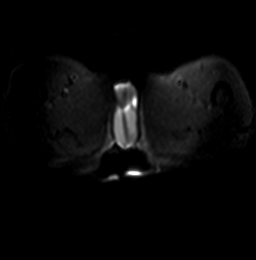
[im 45/90]
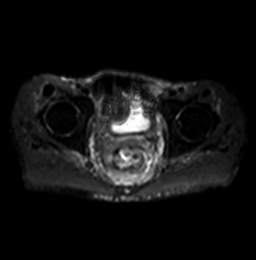
[im 90/90]
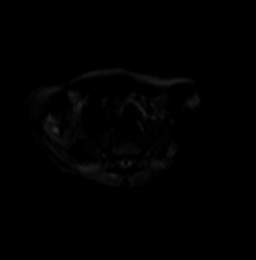

[Series 8: DWI · axial · 5.0mm · 1.35mm/px · z∈[-142,+122]mm · 2 of 44 slices shown (2 of 2)]
[im 1/44]
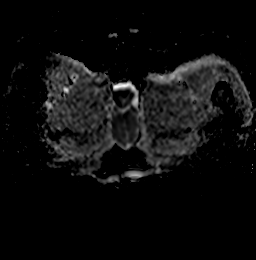
[im 44/44]
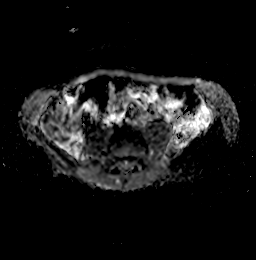

[Series 10: T1 dynamic · axial · 3.0mm · 1.06mm/px · z∈[-143,+118]mm · 3 of 88 slices shown (1 of 8)]
[im 1/88]
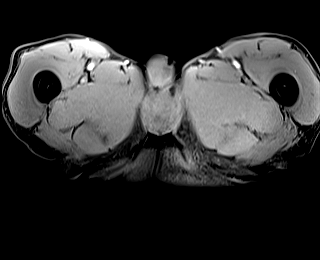
[im 44/88]
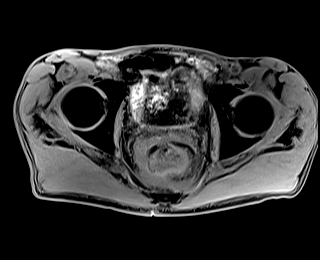
[im 88/88]
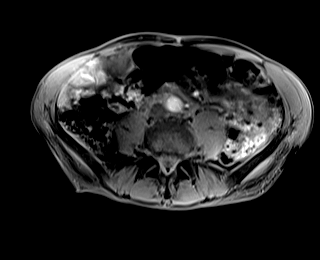

[Series 12: T1 dynamic · axial · 3.0mm · 0.94mm/px · z∈[-153,+132]mm · 3 of 96 slices shown (2 of 8)]
[im 1/96]
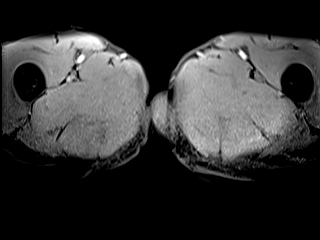
[im 48/96]
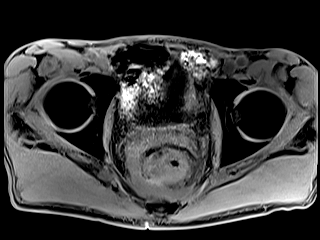
[im 96/96]
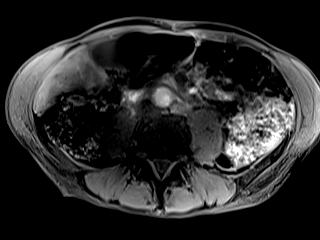

[Series 16: T1 dynamic · axial · 3.0mm · 0.94mm/px · z∈[-153,+132]mm · 3 of 96 slices shown (3 of 8)]
[im 1/96]
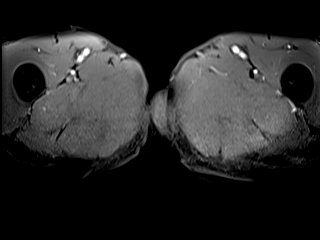
[im 48/96]
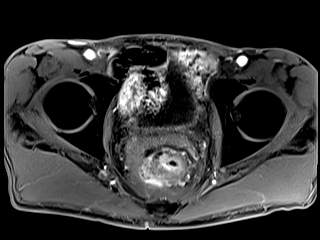
[im 96/96]
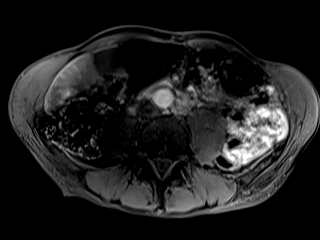

[Series 17: T1 dynamic · axial · 3.0mm · 0.94mm/px · z∈[-153,+132]mm · 3 of 96 slices shown (4 of 8)]
[im 1/96]
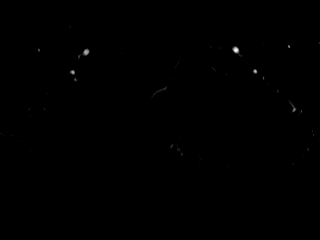
[im 48/96]
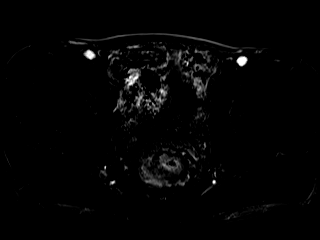
[im 96/96]
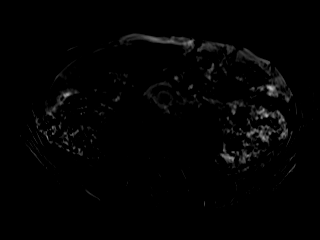

[Series 20: T1 dynamic · axial · 3.0mm · 0.94mm/px · z∈[-153,+132]mm · 3 of 96 slices shown (5 of 8)]
[im 1/96]
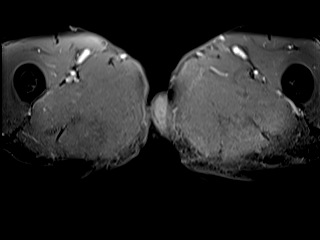
[im 48/96]
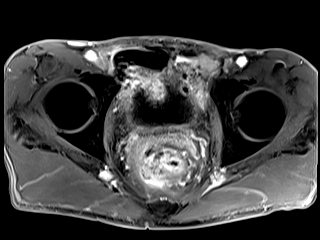
[im 96/96]
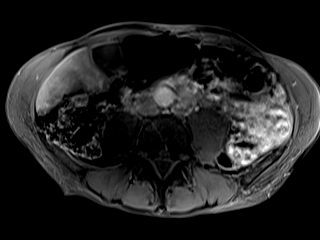

[Series 21: T1 dynamic · axial · 3.0mm · 0.94mm/px · z∈[-153,+132]mm · 3 of 96 slices shown (6 of 8)]
[im 1/96]
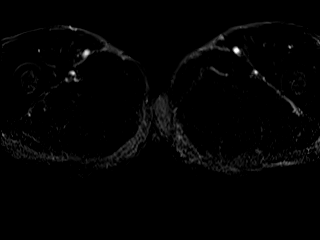
[im 48/96]
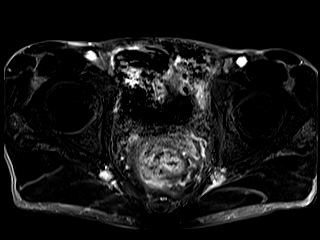
[im 96/96]
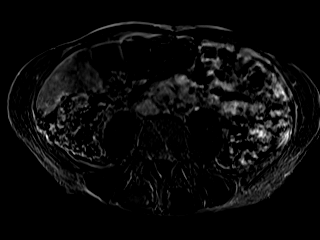

[Series 24: T1 dynamic · axial · 3.0mm · 0.94mm/px · z∈[-153,+132]mm · 3 of 96 slices shown (7 of 8)]
[im 1/96]
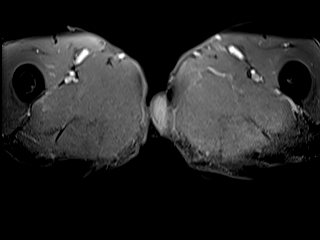
[im 48/96]
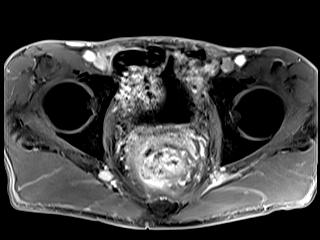
[im 96/96]
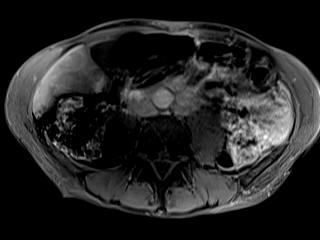

[Series 25: T1 dynamic · axial · 3.0mm · 0.94mm/px · z∈[-153,+132]mm · 3 of 96 slices shown (8 of 8)]
[im 1/96]
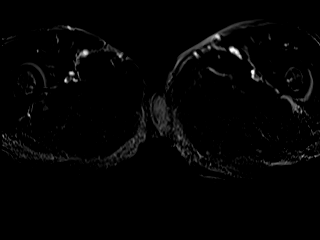
[im 48/96]
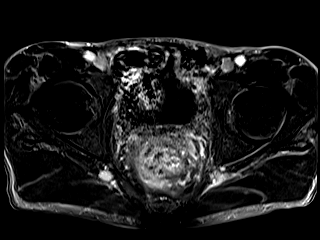
[im 96/96]
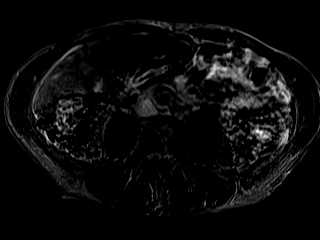

[Series 28: T1 dynamic post-contrast · axial · 3.0mm · 1.06mm/px · z∈[-143,+118]mm · 3 of 88 slices shown (1 of 2)]
[im 1/88]
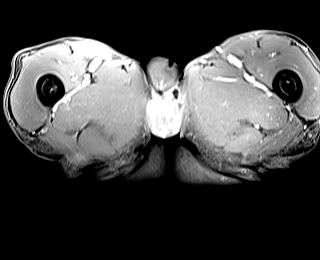
[im 44/88]
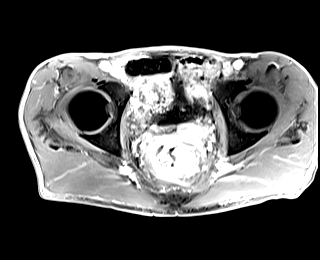
[im 88/88]
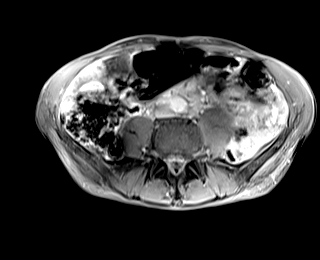

[Series 29: T1 dynamic post-contrast · axial · 3.0mm · 1.06mm/px · 1 of 88 slices shown (2 of 2)]
[im 1/88]
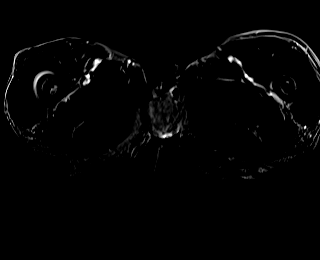

[45 of 48 positions shown; findings below may reference images not displayed]

FINDINGS: TUMOR LOCATION

Tumor distance from Anal Verge/Skin Surface: Approximately 5 cm. Cm

Tumor distance from Internal Anal Sphincter: Approximally 1 cm to
1.2 cm. Cm

TUMOR DESCRIPTION

Circumferential Extent: Circumferential involvement more so along
the RIGHT lateral rectum.

Tumor Length: As great as 8 cm, between 7.5 an 8 cm greatest length.

A mixture of T2 hyperintensity and intermediate T2 signal remains
present in the tumor. Low signal is present along the RIGHT lateral
margin of the tumor previously intermediate T2 signal in this area.
This likely represents a mixture of post treatment changes and
residual tumor. Overall the tumor appears less bulky than on the
previous study. Along the RIGHT lateral margin direct extension
crossing fascial boundaries on the previous study now interrupted by
low T2 signal and clear delineation between the rectal wall and soft
tissue which persists outside of the mesorectum. Approximately
as compared to 2.0 cm on the current study.

High T2 signal along the high rectum at the rectosigmoid transition
still mixed with some intermediate signal measuring approximately
1.5 as compared to 2.5 cm greatest thickness. Bulging to the RIGHT
at this location of the rectal and colonic contour. Some residual
enhancement of colonic thickening.

T - CATEGORY

Extension through Muscularis Propria: Signs of T4b tumor with frank
involvement of the anterior mesorectum, posterior mesorectum and
seminal vesicles quite similar to previous imaging.

Shortest Distance of any tumor/node from Mesorectal Fascia: 0 mm

Extramural Vascular Invasion/Tumor Thrombus: No

Invasion of Anterior Peritoneal Reflection: Yes

Involvement of Adjacent Organs or Pelvic Sidewall: Yes

Levator Ani Involvement: Abutment of RIGHT and LEFT pelvic floor
musculature with thickening of posterior mesorectum and presacral
soft tissue as before, extending along the levator plate and just to
the RIGHT and LEFT of midline.

N - CATEGORY

Mesorectal Lymph Nodes >=5mm: Mesorectal lymph nodes and superior
rectal lymph nodes have nearly completely resolved in the interval.

Suspected there was RIGHT pelvic sidewall nodal involvement on the
previous study which has diminished in size since the prior exam
(image [DATE] on the previous exam a 7 mm irregular lymph node now
approximately 5 mm showing abnormal signal.

Clustered nodes along the superior rectal vein have all but
resolved, the single remaining node measuring 5 mm on image 12 of
series 3 with the largest lymph node in this area previously 6 mm on
the prior exam.

Extra-mesorectal Lymphadenopathy: Yes

Other:

Urinary Tract: No distal ureteral dilation. Urinary bladder with
smooth contours.

Bowel: Rectal cancer description as described.

Mild bowel distension in the lower abdomen of small bowel, likely
ileum suggestion of some bowel edema (image [DATE]).

Vascular/Lymphatic: Small pelvic sidewall lymph nodes are diminished
in size, attention on follow-up. Vascular structures in the pelvis
are patent.

Reproductive: Seminal vesicle involvement.

Other: Bulging of RIGHT lateral high rectum at the site of tumor
without discernible rectal wall as compared to the contralateral
rectum best seen on coronal image 21 through 27. Also on contrasted
coronal imaging where small foci of non enhancement are seen along
the rectal margin (image 15/30 these measure 12 and 9 mm
respectively.

Musculoskeletal: Choose 1
IMPRESSION: Findings suspicious for T4b tumor with mesorectum and seminal
vesicles involvement. Overall findings appear less pronounced than
on the previous study.

Rectal cancer stage: Mild bowel distension in the lower abdomen of
small bowel, likely with some bowel edema. Correlate with any
clinical evidence of colitis. Signs of some response to therapy
still with considerable rectal thickening and residual disease
suspected on the current exam.

Seminal vesicle involvement and involvement of the APR as before.

Cystic changes along the RIGHT lateral rectal wall at the high
rectum raising the question of treated disease beyond the rectal
wall. The patient may be at risk for a contained perforation while
undergoing treatment given the extent of rectal tumor in this
location has tumor involute. Correlate with any worsening abdominal
symptoms.

Signs of presumed enteritis related to radiotherapy.

These results will be called to the ordering clinician or
representative by the Radiologist Assistant, and communication
documented in the PACS or [REDACTED].

## 2021-10-04 MED ORDER — GADOBUTROL 1 MMOL/ML IV SOLN
6.0000 mL | Freq: Once | INTRAVENOUS | Status: AC | PRN
  Administered 2021-10-04: 6 mL via INTRAVENOUS

## 2021-10-05 ENCOUNTER — Telehealth: Payer: Self-pay

## 2021-10-05 NOTE — Progress Notes (Signed)
Jorge [name] OFFICE PROGRESS NOTE  Pcp, No No address on file  DIAGNOSIS: f/u of rectal cancer  PRIOR THERAPY: 1) Concurrent chemoRT with Xeloda, starting 09/06/21. Last radiation on 09/26/21 under the care of Dr. [name]   CURRENT THERAPY: CAPOX (capecitabine and oxaliplatin).  Capecitabine 14 days on and 7 days off and oxaliplatin IV once every 3 weeks.  First dose expected next week ~12/8.  INTERVAL HISTORY: Jorge [name] 70 y.o. male returns to the clinic today for a follow-up visit accompanied by his daughter.  The patient was diagnosed with rectal cancer in October 2022. He had suspicious nodules/masses in the lungs and was seen by pulmonologist, Dr. Valeta Harms who performed a bronchoscopy and biopsy on 09/27/2021.  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   Since the patient was last seen in the clinic, he completed radiation on 09/26/2021 under the care of Dr. [name].  The patient notes improvement in his bowel habits.  He states that he was first diagnosed, his "feces were small".  Now he notes a larger diameter of stools.  He also states that he no longer has the presence of blood in the stool.   He also notes improvement in his pain.  He is only taking MS Contin 15 mg once a day and states this takes his pain level down to a 0 out of 10.  He has oxycodone for breakthrough pain states he is only needed to take this once since his last refill on 11/02/2021.   Otherwise the patient denies any concerning complaints today.  Denies any fever, chills, or night sweats.  He states that his appetite is "great".  He states that he ate a "whole slab of ribs" that his daughter made.  However, he did previously miss an appointment with the nutritionist and him and his daughter are interested in rescheduling this.  He denies any rashes or skin changes.  The patient tolerated his  treatment with Xeloda well without any noticeable adverse side effects.  He denies any chest pain, cough, shortness of breath, or hemoptysis.  He denies any nausea, vomiting, diarrhea, or constipation.  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  MEDICATIONS:  Current Outpatient Medications  Medication Sig Dispense Refill   capecitabine (XELODA) 500 MG tablet Take 3 tablets (1,500 mg total) by mouth 2 (two) times daily after a meal. Take with concurrent radiation, Monday through Friday, off on weekends 90 tablet 0   carbonyl iron (FEOSOL) 45 MG TABS tablet Take 45 mg by mouth daily.     lidocaine-prilocaine (EMLA) cream Apply 1 application topically as needed. 30 g 2   loperamide (IMODIUM A-D) 2 MG tablet Take 4 mg by mouth 4 (four) times daily as needed for diarrhea or loose stools.     morphine (MS CONTIN) 15 MG 12 hr tablet Take 1 tablet (15 mg total) by mouth every 12 (twelve) hours. 60 tablet 0   Multiple Vitamin (MULTIVITAMIN WITH MINERALS) TABS tablet Take 1 tablet by mouth daily.     ondansetron (ZOFRAN) 8 MG tablet Take 1 tablet (8 mg total) by mouth every 8 (eight)  hours as needed for nausea or vomiting. 30 tablet 0   Oxycodone HCl 10 MG TABS Take 1 tablet (10 mg total) by mouth every 4 (four) hours as needed. 60 tablet 0   prochlorperazine (COMPAZINE) 10 MG tablet Take 1 tablet (10 mg total) by mouth every 6 (six) hours as needed for nausea or vomiting. 30 tablet 0   No current facility-administered medications for this visit.    SURGICAL HISTORY:  Past Surgical History:  Procedure Laterality Date   BRONCHIAL BIOPSY  09/27/2021   Procedure: BRONCHIAL BIOPSIES;  Surgeon: Garner Nash, DO;   Location: [facility];  Service: Pulmonary;;   BRONCHIAL BRUSHINGS  09/27/2021   Procedure: BRONCHIAL BRUSHINGS;  Surgeon: Garner Nash, DO;  Location: [facility];  Service: Pulmonary;;   BRONCHIAL NEEDLE ASPIRATION BIOPSY  09/27/2021   Procedure: BRONCHIAL NEEDLE ASPIRATION BIOPSIES;  Surgeon: Garner Nash, DO;  Location: [facility];  Service: Pulmonary;;   COLONOSCOPY     left breast surgery Left 1968   VIDEO BRONCHOSCOPY WITH RADIAL ENDOBRONCHIAL ULTRASOUND  09/27/2021   Procedure: RADIAL ENDOBRONCHIAL ULTRASOUND;  Surgeon: Garner Nash, DO;  Location: [facility];  Service: Pulmonary;;    REVIEW OF SYSTEMS:   Review of Systems  Constitutional: Negative for appetite change, chills, fatigue, fever and unexpected weight change.  HENT: Negative for mouth sores, nosebleeds, sore throat and trouble swallowing.   Eyes: Negative for eye problems and icterus.  Respiratory: Negative for cough, hemoptysis, shortness of breath and wheezing.   Cardiovascular: Negative for chest pain and leg swelling.  Gastrointestinal: Negative for abdominal pain, constipation, diarrhea, nausea and vomiting.  Genitourinary: Negative for bladder incontinence, difficulty urinating, dysuria, frequency and hematuria.   Musculoskeletal: Negative for back pain, gait problem, neck pain and neck stiffness.  Skin: Negative for itching and rash.  Neurological: Negative for dizziness, extremity weakness, gait problem, headaches, light-headedness and seizures.  Hematological: Negative for adenopathy. Does not bruise/bleed easily.  Psychiatric/Behavioral: Negative for confusion, depression and sleep disturbance. The patient is not nervous/anxious.     PHYSICAL EXAMINATION:  Blood pressure (!) 144/87, pulse 78, temperature 97.9 F (36.6 C), temperature source Tympanic, resp. rate 18, height _0  (1.803 m), weight 137 lb 14.4 oz (62.6 kg), SpO2 99 %.  ECOG PERFORMANCE STATUS: 1  Physical Exam   Constitutional: Oriented to person, place, and time and thin appearing male.  And in no distress.   HENT:  Head: Normocephalic and atraumatic.  Mouth/Throat: Oropharynx is clear and moist. No oropharyngeal exudate.  Eyes: Conjunctivae are normal. Right eye exhibits no discharge. Left eye exhibits no discharge. No scleral icterus.  Neck: Normal range of motion. Neck supple.  Cardiovascular: Normal rate, regular rhythm, normal heart sounds and intact distal pulses.   Pulmonary/Chest: Effort normal and breath sounds normal. No respiratory distress. No wheezes. No rales.  Abdominal: Soft. Bowel sounds are normal. Exhibits no distension and no mass. There is no tenderness.  Musculoskeletal: Normal range of motion. Exhibits no edema.  Lymphadenopathy:    No cervical adenopathy.  Neurological: Alert and oriented to person, place, and time. Exhibits normal muscle tone. Gait normal. Coordination normal.  Skin: Skin is warm and dry. No rash noted. Not diaphoretic. No erythema. No pallor.  Psychiatric: Mood, memory and judgment normal.  Vitals reviewed.  LABORATORY DATA: Lab Results  Component Value Date   WBC 4.2 09/23/2021   HGB 12.8 (L) 09/23/2021   HCT 40.5 09/23/2021   MCV 86.9 09/23/2021   PLT 286 09/23/2021  Chemistry      Component Value Date/Time   NA 136 09/23/2021 1622   K 3.9 09/23/2021 1622   CL 100 09/23/2021 1622   CO2 27 09/23/2021 1622   BUN 14 09/23/2021 1622   CREATININE 0.89 09/23/2021 1622      Component Value Date/Time   CALCIUM 9.0 09/23/2021 1622   ALKPHOS 89 09/23/2021 1622   AST 31 09/23/2021 1622   ALT 26 09/23/2021 1622   BILITOT 0.3 09/23/2021 1622       RADIOGRAPHIC STUDIES:  MR PELVIS W WO CONTRAST  Result Date: 10/04/2021 CLINICAL DATA:  History of rectal cancer with large rectal mass and adenopathy previously evaluated with MRI. EXAM: MRI PELVIS WITHOUT AND WITH CONTRAST TECHNIQUE: Multiplanar multisequence MR imaging of the pelvis was  performed both before and after administration of intravenous contrast. Ultrasound gel was administered per rectum to optimize tumor evaluation. CONTRAST:  76m GADAVIST GADOBUTROL 1 MMOL/ML IV SOLN COMPARISON:  Comparison is made with MR evaluation from October of 2022. FINDINGS: TUMOR LOCATION Tumor distance from Anal Verge/Skin Surface: Approximately 5 cm. Cm Tumor distance from Internal Anal Sphincter: Approximally 1 cm to 1.2 cm. Cm TUMOR DESCRIPTION Circumferential Extent: Circumferential involvement more so along the RIGHT lateral rectum. Tumor Length: As great as 8 cm, between 7.5 an 8 cm greatest length. A mixture of T2 hyperintensity and intermediate T2 signal remains present in the tumor. Low signal is present along the RIGHT lateral margin of the tumor previously intermediate T2 signal in this area. This likely represents a mixture of post treatment changes and residual tumor. Overall the tumor appears less bulky than on the previous study. Along the RIGHT lateral margin direct extension crossing fascial boundaries on the previous study now interrupted by low T2 signal and clear delineation between the rectal wall and soft tissue which persists outside of the mesorectum. Approximately 1.6 as compared to 2.0 cm on the current study. High T2 signal along the high rectum at the rectosigmoid transition still mixed with some intermediate signal measuring approximately 1.5 as compared to 2.5 cm greatest thickness. Bulging to the RIGHT at this location of the rectal and colonic contour. Some residual enhancement of colonic thickening. T - CATEGORY Extension through [employer]: Signs of T4b tumor with frank involvement of the anterior mesorectum, posterior mesorectum and seminal vesicles quite similar to previous imaging. Shortest Distance of any tumor/node from Mesorectal Fascia: 0 mm Extramural Vascular Invasion/Tumor Thrombus: No Invasion of Anterior Peritoneal Reflection: Yes Involvement of Adjacent  Organs or Pelvic Sidewall: Yes Levator Ani Involvement: Abutment of RIGHT and LEFT pelvic floor musculature with thickening of posterior mesorectum and presacral soft tissue as before, extending along the levator plate and just to the RIGHT and LEFT of midline. N - CATEGORY Mesorectal Lymph Nodes >=548m Mesorectal lymph nodes and superior rectal lymph nodes have nearly completely resolved in the interval. Suspected there was RIGHT pelvic sidewall nodal involvement on the previous study which has diminished in size since the prior exam (image 7/8 on the previous exam a 7 mm irregular lymph node now approximately 5 mm showing abnormal signal. Clustered nodes along the superior rectal vein have all but resolved, the single remaining node measuring 5 mm on image 12 of series 3 with the largest lymph node in this area previously 6 mm on the prior exam. Extra-mesorectal Lymphadenopathy: Yes Other: Urinary Tract: No distal ureteral dilation. Urinary bladder with smooth contours. Bowel: Rectal cancer description as described. Mild bowel distension in the lower abdomen  of small bowel, likely ileum suggestion of some bowel edema (image 11/3). Vascular/Lymphatic: Small pelvic sidewall lymph nodes are diminished in size, attention on follow-up. Vascular structures in the pelvis are patent. Reproductive: Seminal vesicle involvement. Other: Bulging of RIGHT lateral high rectum at the site of tumor without discernible rectal wall as compared to the contralateral rectum best seen on coronal image 21 through 27. Also on contrasted coronal imaging where small foci of non enhancement are seen along the rectal margin (image 15/30 these measure 12 and 9 mm respectively. Musculoskeletal: Choose 1 IMPRESSION: Findings suspicious for T4b tumor with mesorectum and seminal vesicles involvement. Overall findings appear less pronounced than on the previous study. Rectal cancer stage: Mild bowel distension in the lower abdomen of small bowel,  likely with some bowel edema. Correlate with any clinical evidence of colitis. Signs of some response to therapy still with considerable rectal thickening and residual disease suspected on the current exam. Seminal vesicle involvement and involvement of the APR as before. Cystic changes along the RIGHT lateral rectal wall at the high rectum raising the question of treated disease beyond the rectal wall. The patient may be at risk for a contained perforation while undergoing treatment given the extent of rectal tumor in this location has tumor involute. Correlate with any worsening abdominal symptoms. 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 These results will be called to the ordering clinician or representative by the Radiologist Assistant, and communication documented in the PACS or [employer]. Electronically Signed   By: Zetta Bills M.D.   On: 10/04/2021 12:20   DG CHEST PORT 1 VIEW  Result Date: 09/27/2021 CLINICAL DATA:  Post bronchoscopy with biopsy of RIGHT pulmonary lesions. EXAM: PORTABLE CHEST 1 VIEW COMPARISON:  None FINDINGS: RIGHT upper lobe and middle lobe masses and nodules better demonstrated on previous CT without gross change. EKG leads project over the chest. No signs of pneumothorax. Skin folds noted along the LEFT peripheral chest about the scapula. Cardiomediastinal contours are stable. On limited assessment there is no acute skeletal process. IMPRESSION: No acute findings following biopsy, specifically no signs of pneumothorax. RIGHT upper lobe and middle lobe masses and nodules better demonstrated on previous CT without gross change. Electronically Signed   By: Zetta Bills M.D.   On: 09/27/2021 14:27   DG C-ARM BRONCHOSCOPY  Result Date: 09/27/2021 C-ARM BRONCHOSCOPY: Fluoroscopy was utilized by the requesting physician.  No radiographic interpretation.     ASSESSMENT/PLAN:  Jorge [name] is a 70 y.o. male with    1. Rectal Adenocarcinoma, [phone]  with lung metastasis, MMR normal  -Presented with rectal bleeding and frequent bowel movement. CT AP 07/23/21 showed rectal mass with no significant adenopathy or distant metastasis. Colonoscopy 08/19/21 showed a 7 cm infiltrative mass in the mid rectum (6-13cm from anal verge), pathology showed adenocarcinoma. MMR is normal -staging pelvic MRI 08/27/21 showed: staging at T4b; N2 likely associated with early extra mesorectal lymph node involvement; distance from tumor to internal anal sphincter is 1.2 cm; also with extramural venous involvement. -he began concurrent chemoRT with Xeloda on 09/06/21. He completed radiation on 09/26/21 under the care of Dr. [name]. He noticed improvement in his symptoms with radiation.  -The patient underwent bronchscopy and biopsy of the RUL and RML masses which were consistent with metastatic rectal cancer.  -The patient was seen with [name] today. [name] reviewed the results of the biopsy with the patient. [name] discussed that his condition is now considered stage IV, which  is treatable but not curable.  -The patient was seen with [name] today.  [name] had a lengthy discussion today with the patient for his current condition and recommended treatment options. [name] would recommend chemotherapy with either CAPOX or FOLFOX with avastin. [name] discussed the logistics of each.  -The patient and his daughter find CAPOX attractive with less appointments and since he tolerated Xeloda well in the past.  -Therefore, [name] recommended systemic treatment with chemotherapy CAPOX. She will send a refill of xeloda to his pharmacy. She discussed this is 14 days on, 7 days off. Discussed he should start his 14 days on with day 1 of every chemotherapy cycle. Expected to start first dose next week on ~12/7-12/9. [name] will reduce his chemotherapy with the first cycle of treatment due to his age to ensure tolerability. If he tolerates it well, she will increase the dose  starting from cycle #2.              --Chemotherapy consent: Side effects including but does not limited to, fatigue, nausea, vomiting, diarrhea, hair loss, neuropathy, fluid retention, renal and kidney dysfunction, neutropenic fever, needed for blood transfusion, bleeding, were discussed with patient in great detail. He agrees to proceed. -I will arrange for a chemoeducation class prior to his first cycle of treatment.  -We will also arrange for a port-a-cath placement. I have sent EMLA cream to his pharmacy.  -He already has prescriptions for zofran and compazine. He knows to call when he needs a refill.  -I have called and requested foundation one testing on his initial surgical pathology from the rectum. Given our office call back number if there is a problem with the tissue.  -We will see him back for a follow up visit in 2 weeks for a 1 week follow up and to manage any adverse side effects of treatment.    2. Symptom Management: Rectal pain, weight loss, fatigue -Secondary to rectal cancer  -he is on oxycodone and MS Contin, pain is controlled. -Today (10/06/21) he states he only takes 1 MS contin per day and it takes his pain to a 0/10. He has only needed to take 1 oxycodone for break through pain since refilled on 10/03/21.  -Dietitian referral; he did not show to 09/01/21 appointment. They are interested in reconnecting with nutrition. I will send a staff message to see if they can see him in infusion at one of his appointments. I did re-iterate high calorie high protein diet.    3. Mild anemia from rectal bleeding, probable iron deficiency -He has started oral iron, tolerating well.  We will continue.   4. Pulmonary nodules/metastatic rectal adenocarcinoma -Seen on chest CT 08/26/21 showing 2.2 cm RML nodule and four RUL nodules/masses measuring up to 3.6 cm. Findings concerning for metastatic primary lung cancer. -He met with Dr. Valeta Harms who performed bronchoscopy. The RML and RUL lesions  were positive for metastatic rectal adenocarcinoma.   -He denies any chest pain, shortness of breath, cough, or hemoptysis.    5. Multiple colon polyps -He has a large 3.5 cm polyps in the hepatic flexure, biopsy showed tubular adenoma.  Dr. [name] plan to repeat a colonoscopy in 6 months and remove it -He had additional 4 small polyps which were removed, path showed tubular adenomas.     PLAN:  -Start CAPOX next week ~12/7-12/9 -Chemoeducation prior to first infusion -1 week follow up  -Ordered port-a-cath placement -Requested foundation one testing -EMLA cream sent  to pharmacy -Reached to nutritionist/dietician  -[name] will refill Xeloda    Orders Placed This Encounter  Procedures   IR IMAGING GUIDED PORT INSERTION    Standing Status:   Future    Standing Expiration Date:   10/06/2022    Order Specific Question:   Reason for Exam (SYMPTOM  OR DIAGNOSIS REQUIRED)    Answer:   Chemotherapy. Starting around 12/7, 12/8, or 12/9 every 3 weeks. Does not need before staring but if can be inserted before cycle 2 (week after christmas)    Order Specific Question:   Preferred Imaging Location?    Answer:   [facility], [facility] 10/06/21  Addendum I have seen the patient, examined him. I agree with the assessment and and plan and have edited the notes.   Mr. Darra Lis has recovered well from radiation, and his rectal symptoms has significantly improved.  I reviewed his lung biopsy results, which is consistent with metastatic rectal cancer.  I reviewed the incurable nature of metastatic rectal cancer, and recommend systemic chemotherapy.  I discussed option of FOLFOX and CAPOX, he tolerated Xeloda very well with radiation, and would like to proceed with Capox, I may reduce oxaliplatin dose due to his advanced age.  Will obtain MMR and FO to see if he is a candidate for immunotherapy and targeted therapy.  We will place a port.  Plan to start in 1-2  weeks. Chemo class. All questions answered.   [name]  10/06/2021

## 2021-10-05 NOTE — Telephone Encounter (Signed)
Spoke with pt's daughter to confirm appt date and time.  Pt is scheduled for 10/06/2021 @0900am .

## 2021-10-06 ENCOUNTER — Telehealth: Payer: Self-pay | Admitting: Pulmonary Disease

## 2021-10-06 ENCOUNTER — Encounter: Payer: Self-pay | Admitting: Physician Assistant

## 2021-10-06 ENCOUNTER — Inpatient Hospital Stay: Payer: Medicare Other | Attending: Hematology | Admitting: Physician Assistant

## 2021-10-06 ENCOUNTER — Other Ambulatory Visit: Payer: Self-pay | Admitting: Hematology

## 2021-10-06 ENCOUNTER — Other Ambulatory Visit: Payer: Self-pay

## 2021-10-06 VITALS — BP 144/87 | HR 78 | Temp 97.9°F | Resp 18 | Ht 71.0 in | Wt 137.9 lb

## 2021-10-06 DIAGNOSIS — Z5111 Encounter for antineoplastic chemotherapy: Secondary | ICD-10-CM | POA: Insufficient documentation

## 2021-10-06 DIAGNOSIS — C20 Malignant neoplasm of rectum: Secondary | ICD-10-CM | POA: Insufficient documentation

## 2021-10-06 DIAGNOSIS — Z79899 Other long term (current) drug therapy: Secondary | ICD-10-CM | POA: Insufficient documentation

## 2021-10-06 DIAGNOSIS — Z7189 Other specified counseling: Secondary | ICD-10-CM | POA: Insufficient documentation

## 2021-10-06 DIAGNOSIS — D649 Anemia, unspecified: Secondary | ICD-10-CM | POA: Diagnosis not present

## 2021-10-06 DIAGNOSIS — Z7689 Persons encountering health services in other specified circumstances: Secondary | ICD-10-CM | POA: Insufficient documentation

## 2021-10-06 DIAGNOSIS — C7800 Secondary malignant neoplasm of unspecified lung: Secondary | ICD-10-CM | POA: Diagnosis not present

## 2021-10-06 MED ORDER — CAPECITABINE 500 MG PO TABS
850.0000 mg/m2 | ORAL_TABLET | Freq: Two times a day (BID) | ORAL | 0 refills | Status: DC
  Filled 2021-10-06: qty 84, 14d supply, fill #0
  Filled 2021-10-10: qty 84, 21d supply, fill #0

## 2021-10-06 MED ORDER — LIDOCAINE-PRILOCAINE 2.5-2.5 % EX CREA
1.0000 | TOPICAL_CREAM | CUTANEOUS | 2 refills | Status: DC | PRN
Start: 2021-10-06 — End: 2022-04-20

## 2021-10-06 NOTE — Progress Notes (Signed)
START ON PATHWAY REGIMEN - Colorectal     A cycle is every 21 days:     Capecitabine      Bevacizumab-xxxx      Oxaliplatin   **Always confirm dose/schedule in your pharmacy ordering system**  Patient Characteristics: Distant Metastases, Nonsurgical Candidate, KRAS/NRAS Mutation Positive/Unknown (BRAF V600 Wild-Type/Unknown), Standard Cytotoxic Therapy, First Line Standard Cytotoxic Therapy, Bevacizumab Eligible, PS = 0,1 Tumor Location: Rectal Therapeutic Status: Distant Metastases Microsatellite/Mismatch Repair Status: Unknown BRAF Mutation Status: Awaiting Test Results KRAS/NRAS Mutation Status: Awaiting Test Results Standard Cytotoxic Line of Therapy: First Line Standard Cytotoxic Therapy ECOG Performance Status: 1 Bevacizumab Eligibility: Eligible Intent of Therapy: Non-Curative / Palliative Intent, Discussed with Patient

## 2021-10-06 NOTE — Progress Notes (Signed)
I called and spoke with the patient. He is aware of the results.   Thanks,  BLI  Garner Nash, DO [facility] 10/06/2021 5:14 PM

## 2021-10-06 NOTE — Patient Instructions (Addendum)
Summary:  -When Icard biopsied the spots in the right upper lobe of the lung and right middle lobe, the pathologist looked at it under the microscope. The pathology is consistent with colorectal cancer called Adenocarcinoma. This means the cancer from the rectum has travelled and spread to the lung. -We covered a lot of important information at your appointment today regarding what the treatment plan is moving forward. Here are the the main points that were discussed at your office visit with [place] today: Options:  1) FOLFOX plus avastin which is 3 chemotherapy drugs (5-FU and oxaliplatin and a vitamin leucovorin and avastin).  This is given by an IV every 2 weeks.  However, you go home with a pump on the day of treatment and have to come back 2 days later to get the pump removed.  These are both equally effective. This one is tolerated slightly better. 2) CAPOX (capecitabine (pill) and oxaliplatin (IV) is given every 3 weeks.  You take a pill for 14 days (including weekend) and then off the pill for 7 days twice a day.  You then also receive 1 chemotherapy drug via an IV on the first day of treatment every 3 weeks. This can cause cold sensitivity after treatment so avoid drinking cold liquids.  -We are planning on starting your treatment in about 1 week but before your start your treatment, I would like you to attend a Chemotherapy Education Class. This involves having you sit down with one of our nurse educators. She will discuss with your one-on-one more details about your treatment as well as general information about resources here at the cancer center.  -Your treatment will be given once every 3 weeks.  -Side effects including but does not limited to, fatigue, nausea, vomiting, diarrhea, hair loss, neuropathy, taste changes, cold sensitivity, renal and kidney dysfunction, infections, needed for blood transfusion, and bleeding.    Medications:  -I have sent a few important medication prescriptions to your  pharmacy.  -Compazine was sent to your pharmacy. This medication is for nausea. You may take this every 6 hours as needed if you feel nauseous.  -I have also send a prescription/refill for zofran. This can be taken every 8 hours as needed for nausea. However, please wait 3 days after chemotherapy to take this. The reason why is because you get a stronger version of this nausea medication on the day of treatment that stays in your system for 3 days. So if you go home and take zofran, it likely won't be effective.  -I have sent a prescription for EMLA cream to your pharmacy. This is to use to numb the port site before you come to treatments. You cannot use the cream until the glue has come off the surgical site. So you may need to use ice for the first infusion.  -[name] will send a refill of Xeloda. Please start this on the day of chemotherapy.    Referrals or Imaging: -I have placed a referral to interventional radiology for a port-a-cath placement. -I will reach out to nutrition to see about reconnecting with you about nutrition   Follow up:  -We will see you back for a follow up visit in 2 weeks before your first treatment.    -If you need to reach [place] at any time, the main office number to the cancer center is [phone].

## 2021-10-06 NOTE — Telephone Encounter (Signed)
PCCM  Received pathology results back from recent bronchoscopy.  Pathology consistent with metastatic rectal cancer.  FINAL MICROSCOPIC DIAGNOSIS:   A. LUNG, RUL TARGET #1, BIOPSY:  - Malignant cells consistent with patient's clinical history of primary  rectal adenocarcinoma, see comment   B. LUNG, RUL TARGET #1, BRUSHING:  - Malignant cells consistent with patient's clinical history of primary  rectal adenocarcinoma   C. LUNG, RML TARGET #2, FINE NEEDLE ASPIRATION:  - Malignant cells consistent with patient's clinical history of primary  rectal adenocarcinoma   D. LUNG, RML TARGET #2, BRUSHING:  - Malignant cells consistent with patient's clinical history of primary  rectal adenocarcinoma   Patient has follow-up already scheduled with [name].  I will let Dr. [name] know as well.  If patient calls back to the office okay to let him know the results.  I am happy to discuss with him further.  I left a message on his voicemail with our office telephone number.  Garner Nash, DO [facility] 10/06/2021 11:55 AM

## 2021-10-07 ENCOUNTER — Telehealth: Payer: Self-pay | Admitting: Hematology

## 2021-10-07 ENCOUNTER — Telehealth: Payer: Self-pay

## 2021-10-07 ENCOUNTER — Other Ambulatory Visit (HOSPITAL_COMMUNITY): Payer: Self-pay

## 2021-10-07 NOTE — Telephone Encounter (Signed)
Oral Oncology Pharmacist Encounter  Received new prescription for capecitabine (Xeloda) for the palliative treatment of metastatic rectal adenocarcinoma in conjunction with oxaliplatin, planned duration approximately 3 months.  Labs from 09/23/21 assessed, no relevant lab abnormalities. Prescription dose and frequency assessed.   Current medication list in Epic reviewed, no relevant/significant DDIs with capecitabine identified.  Evaluated chart and no patient barriers to medication adherence identified.   Prescription has been e-scribed to the [facility] for benefits analysis and approval.  Oral Oncology Clinic will continue to follow for insurance authorization, copayment issues, initial counseling and start date.  Patient agreed to treatment on 10/06/21 per MD documentation.  Benn Moulder, PharmD Pharmacy Resident  10/07/2021 12:36 PM

## 2021-10-07 NOTE — Telephone Encounter (Signed)
Spoke with pt's daughter via telephone regarding the pt's Capecitabine oral directions.  Informed daughter that a new prescription was sent to the [facility].  Informed pt that her father is supposed to start taking the Xeloda the same day he finishes his IV chemotherapy infusion.  Pt is to start taking 1 Capecitabine the evening of his IV chemotherapy for 14 days and then 7 days off.  Pt's daughter verbalized understanding of teaching and stated she will pickup her dad's prescription from [facility].

## 2021-10-07 NOTE — Telephone Encounter (Signed)
Sch per 12/1 los,pt daughter aware

## 2021-10-07 NOTE — Progress Notes (Signed)
Pharmacist Chemotherapy Monitoring - Initial Assessment    Anticipated start date: 10/14/21   The following has been reviewed per standard work regarding the patient's treatment regimen: The patient's diagnosis, treatment plan and drug doses, and organ/hematologic function Lab orders and baseline tests specific to treatment regimen  The treatment plan start date, drug sequencing, and pre-medications Prior authorization status  Patient's documented medication list, including drug-drug interaction screen and prescriptions for anti-emetics and supportive care specific to the treatment regimen The drug concentrations, fluid compatibility, administration routes, and timing of the medications to be used The patient's access for treatment and lifetime cumulative dose history, if applicable  The patient's medication allergies and previous infusion related reactions, if applicable   Changes made to treatment plan:  treatment plan date  Follow up needed:  Pending authorization for treatment     Kennith Center, Pharm.D., CPP 10/07/2021@12 :39 PM

## 2021-10-10 ENCOUNTER — Other Ambulatory Visit: Payer: Self-pay

## 2021-10-10 ENCOUNTER — Encounter: Payer: Self-pay | Admitting: Hematology

## 2021-10-10 ENCOUNTER — Inpatient Hospital Stay: Payer: Medicare Other

## 2021-10-10 ENCOUNTER — Other Ambulatory Visit (HOSPITAL_COMMUNITY): Payer: Self-pay

## 2021-10-10 NOTE — Progress Notes (Signed)
Met with patient and accompanying adult at registration to introduce myself as Financial Resource Specialist and to offer available resources.  Discussed one-time $1000 [employer] grant and qualifications to assist with personal expenses while going through treatment.  Gave my card if interested in applying and for any additional financial questions or concerns. 

## 2021-10-10 NOTE — Telephone Encounter (Signed)
Oral Chemotherapy Pharmacist Encounter  I spoke with patient's daugher, Tyris Eliot, for overview of: Xeloda (capecitabine) for the treatment of metastatic rectal cancer in conjunction with oxaliplatin, planned duration until disease progression or unacceptable drug toxicity.  Counseled on administration, dosing, side effects, monitoring, drug-food interactions, safe handling, storage, and disposal.  Patient will take Xeloda 500mg  tablets, 3 tablets (1500mg ) by mouth in AM and 3 tabs (1500mg ) by mouth in PM, within 30 minutes of finishing meals, on days 1-14 of each 21 day cycle.   Oxaliplatin will be infused on day 1 of each 21 day cycle.  Xeloda and oxaliplatin start date: 10/14/21  Adverse effects include but are not limited to: fatigue, decreased blood counts, GI upset, diarrhea, mouth sores, and hand-foot syndrome.  Patient has anti-emetic on hand and knows to take it if nausea develops.   Patient has anti diarrheal on hand at home, but will alert the office of 4 or more loose stools above baseline is experienced.   Reviewed importance of keeping a medication schedule and plan for any missed doses. No barriers to medication adherence identified.  Medication reconciliation performed and medication/allergy list updated.  Insurance authorization for Xeloda has been obtained. Test claim at the pharmacy revealed copayment $27.10 for 1st fill of Xeloda. Family will pick this up the [facility] outpatient [facility] on 10/10/21.   Informed patient's daughter the pharmacy will reach out 5-7 days prior to needing next fill of Xeloda to coordinate continued medication acquisition to prevent break in therapy.  All questions answered.  Carmelina Peal voiced understanding and appreciation.   Medication education handout placed in mail for patient and family. Patient's daughter knows to call the office with questions or concerns. Oral Chemotherapy Clinic phone number provided.   Leron Croak,  PharmD, BCPS Hematology/Oncology Clinical Pharmacist [facility] [phone] 10/10/2021 10:33 AM

## 2021-10-10 NOTE — Patient Instructions (Signed)
t

## 2021-10-12 ENCOUNTER — Other Ambulatory Visit: Payer: Self-pay | Admitting: Hematology

## 2021-10-12 ENCOUNTER — Encounter: Payer: Self-pay | Admitting: Hematology

## 2021-10-13 ENCOUNTER — Encounter: Payer: Self-pay | Admitting: Hematology

## 2021-10-13 ENCOUNTER — Ambulatory Visit: Payer: Medicare Other | Admitting: Hematology

## 2021-10-13 ENCOUNTER — Other Ambulatory Visit: Payer: Self-pay | Admitting: Hematology

## 2021-10-13 MED ORDER — OXYCODONE HCL 10 MG PO TABS: 10.0000 mg | ORAL_TABLET | ORAL | 0 refills | Status: DC | PRN

## 2021-10-13 MED FILL — Dexamethasone Sodium Phosphate Inj 100 MG/10ML: INTRAMUSCULAR | Qty: 1 | Status: AC

## 2021-10-14 ENCOUNTER — Inpatient Hospital Stay: Payer: Medicare Other

## 2021-10-14 ENCOUNTER — Inpatient Hospital Stay: Payer: Medicare Other | Admitting: Dietician

## 2021-10-14 ENCOUNTER — Other Ambulatory Visit: Payer: Self-pay

## 2021-10-14 ENCOUNTER — Other Ambulatory Visit: Payer: Self-pay | Admitting: Hematology

## 2021-10-14 ENCOUNTER — Other Ambulatory Visit: Payer: Medicare Other

## 2021-10-14 VITALS — BP 132/70 | HR 62 | Temp 98.5°F | Resp 18 | Wt 136.8 lb

## 2021-10-14 DIAGNOSIS — C20 Malignant neoplasm of rectum: Secondary | ICD-10-CM

## 2021-10-14 DIAGNOSIS — Z5111 Encounter for antineoplastic chemotherapy: Secondary | ICD-10-CM | POA: Diagnosis not present

## 2021-10-14 LAB — CBC WITH DIFFERENTIAL (CANCER CENTER ONLY)
Abs Immature Granulocytes: 0 10*3/uL (ref 0.00–0.07)
Basophils Absolute: 0 10*3/uL (ref 0.0–0.1)
Basophils Relative: 1 %
Eosinophils Absolute: 0.2 10*3/uL (ref 0.0–0.5)
Eosinophils Relative: 7 %
HCT: 40.4 % (ref 39.0–52.0)
Hemoglobin: 12.7 g/dL — ABNORMAL LOW (ref 13.0–17.0)
Immature Granulocytes: 0 %
Lymphocytes Relative: 22 %
Lymphs Abs: 0.8 10*3/uL (ref 0.7–4.0)
MCH: 27.8 pg (ref 26.0–34.0)
MCHC: 31.4 g/dL (ref 30.0–36.0)
MCV: 88.4 fL (ref 80.0–100.0)
Monocytes Absolute: 0.7 10*3/uL (ref 0.1–1.0)
Monocytes Relative: 21 %
Neutro Abs: 1.7 10*3/uL (ref 1.7–7.7)
Neutrophils Relative %: 49 %
Platelet Count: 258 10*3/uL (ref 150–400)
RBC: 4.57 MIL/uL (ref 4.22–5.81)
RDW: 15.9 % — ABNORMAL HIGH (ref 11.5–15.5)
WBC Count: 3.5 10*3/uL — ABNORMAL LOW (ref 4.0–10.5)
nRBC: 0 % (ref 0.0–0.2)

## 2021-10-14 LAB — CMP (CANCER CENTER ONLY)
ALT: 64 U/L — ABNORMAL HIGH (ref 0–44)
AST: 61 U/L — ABNORMAL HIGH (ref 15–41)
Albumin: 3.1 g/dL — ABNORMAL LOW (ref 3.5–5.0)
Alkaline Phosphatase: 95 U/L (ref 38–126)
Anion gap: 8 (ref 5–15)
BUN: 11 mg/dL (ref 8–23)
CO2: 27 mmol/L (ref 22–32)
Calcium: 8.8 mg/dL — ABNORMAL LOW (ref 8.9–10.3)
Chloride: 103 mmol/L (ref 98–111)
Creatinine: 0.95 mg/dL (ref 0.61–1.24)
GFR, Estimated: >60 mL/min (ref >60)
Glucose, Bld: 65 mg/dL — ABNORMAL LOW (ref 70–99)
Potassium: 3.9 mmol/L (ref 3.5–5.1)
Sodium: 138 mmol/L (ref 135–145)
Total Bilirubin: 0.3 mg/dL (ref 0.3–1.2)
Total Protein: 7.2 g/dL (ref 6.5–8.1)

## 2021-10-14 LAB — CEA (IN HOUSE-CHCC): CEA (CHCC-In House): 275.15 ng/mL — ABNORMAL HIGH (ref 0.00–5.00)

## 2021-10-14 MED ORDER — OXALIPLATIN CHEMO INJECTION 100 MG/20ML
110.0000 mg/m2 | Freq: Once | INTRAVENOUS | Status: AC
  Administered 2021-10-14: 195 mg via INTRAVENOUS
  Filled 2021-10-14: qty 39

## 2021-10-14 MED ORDER — LIDOCAINE-PRILOCAINE 2.5-2.5 % EX CREA: TOPICAL_CREAM | CUTANEOUS | 3 refills | Status: DC

## 2021-10-14 MED ORDER — SODIUM CHLORIDE 0.9 % IV SOLN
10.0000 mg | Freq: Once | INTRAVENOUS | Status: AC
  Administered 2021-10-14: 10 mg via INTRAVENOUS
  Filled 2021-10-14: qty 10

## 2021-10-14 MED ORDER — OXYCODONE HCL 10 MG PO TABS: 10.0000 mg | ORAL_TABLET | ORAL | 0 refills | Status: DC | PRN

## 2021-10-14 MED ORDER — ONDANSETRON HCL 8 MG PO TABS: 8.0000 mg | ORAL_TABLET | Freq: Two times a day (BID) | ORAL | 1 refills | Status: DC | PRN

## 2021-10-14 MED ORDER — PALONOSETRON HCL INJECTION 0.25 MG/5ML
0.2500 mg | Freq: Once | INTRAVENOUS | Status: AC
  Administered 2021-10-14: 0.25 mg via INTRAVENOUS
  Filled 2021-10-14: qty 5

## 2021-10-14 MED ORDER — LORAZEPAM 0.5 MG PO TABS: 0.5000 mg | ORAL_TABLET | Freq: Four times a day (QID) | ORAL | 0 refills | Status: DC | PRN

## 2021-10-14 MED ORDER — DEXAMETHASONE 4 MG PO TABS: 8.0000 mg | ORAL_TABLET | Freq: Every day | ORAL | 1 refills | Status: DC

## 2021-10-14 MED ORDER — PROCHLORPERAZINE MALEATE 10 MG PO TABS: 10.0000 mg | ORAL_TABLET | Freq: Four times a day (QID) | ORAL | 1 refills | Status: DC | PRN

## 2021-10-14 MED ORDER — DEXTROSE 5 % IV SOLN: Freq: Once | INTRAVENOUS | Status: AC

## 2021-10-14 NOTE — Progress Notes (Signed)
Nutrition Assessment   Reason for Assessment: Patient request   ASSESSMENT: 70 year old male with metastatic rectal cancer. He completed concurrent chemoradiation with Xeloda. Last radiation on 09/26/21 under care of Dr. [name]. Patient is currently receiving COPOX.   Past medical history includes anemia.  Met with patient during infusion. He reports he has a great appetite and eating a variety of foods. Patient had 4 waffles and drank one ensure for breakfast. Patient ate left over ribs, squash, asparagus for lunch. He snacks on avocados, chocolate, blueberries, peanut butter/jelly sandwiches. He is drinking 64 ounces of water/day. He reports regular bowel movements, denies nausea, vomiting. Patient asking how he can gain weight.    Nutrition Focused Physical Exam: unable to complete   Medications: Feosol, loperamide, morphine, MVI, zofran, oxycodone HCI, compazine   Labs: Glucose 65   Anthropometrics: Weights have decreased ~7.5 lbs (5.2%) in 6 weeks; insignificant  Height: 5'11" Weight: 136 lb 12 oz today  UBW: 144 lb 8 oz (08/30/21) BMI: 19.07   NUTRITION DIAGNOSIS: Food and nutrition knowledge related deficit related to cancer and associated treatments as evidenced by no prior need for related nutrition information     INTERVENTION:  Encouraged small frequent meals and snacks with adequate calories and protein - handout with snack ideas provided  Continue drinking Ensure supplement, recommend 2-3 Ensure Plus/equivalent daily (350 kcal, 16 g protein) - coupons provided Encouraged bedtime snack for added calories and protein Discussed possible cold sensitivity side effects - handout provided  Warm supplement ideas provided Contact information provided     MONITORING, EVALUATION, GOAL: weight trends, intake    Next Visit: Thursday January 19 during infusion

## 2021-10-14 NOTE — Patient Instructions (Signed)
[facility]  Discharge Instructions: Thank you for choosing [facility] to provide your oncology and hematology care.   If you have a lab appointment with the [facility], please go directly to the [facility] and check in at the registration area.   Wear comfortable clothing and clothing appropriate for easy access to any Portacath or PICC line.   We strive to give you quality time with your provider. You may need to reschedule your appointment if you arrive late (15 or more minutes).  Arriving late affects you and other patients whose appointments are after yours.  Also, if you miss three or more appointments without notifying the office, you may be dismissed from the clinic at the provider's discretion.      For prescription refill requests, have your pharmacy contact our office and allow 72 hours for refills to be completed.    Today you received the following chemotherapy and/or immunotherapy agent: Oxaliplatin (Eloxatin)   To help prevent nausea and vomiting after your treatment, we encourage you to take your nausea medication as directed.  BELOW ARE SYMPTOMS THAT SHOULD BE REPORTED IMMEDIATELY: *FEVER GREATER THAN 100.4 F (38 C) OR HIGHER *CHILLS OR SWEATING *NAUSEA AND VOMITING THAT IS NOT CONTROLLED WITH YOUR NAUSEA MEDICATION *UNUSUAL SHORTNESS OF BREATH *UNUSUAL BRUISING OR BLEEDING *URINARY PROBLEMS (pain or burning when urinating, or frequent urination) *BOWEL PROBLEMS (unusual diarrhea, constipation, pain near the anus) TENDERNESS IN MOUTH AND THROAT WITH OR WITHOUT PRESENCE OF ULCERS (sore throat, sores in mouth, or a toothache) UNUSUAL RASH, SWELLING OR PAIN  UNUSUAL VAGINAL DISCHARGE OR ITCHING   Items with * indicate a potential emergency and should be followed up as soon as possible or go to the Emergency Department if any problems should occur.  Please show the CHEMOTHERAPY ALERT CARD or IMMUNOTHERAPY ALERT CARD at  check-in to the Emergency Department and triage nurse.  Should you have questions after your visit or need to cancel or reschedule your appointment, please contact [facility]  Dept: [phone]  and follow the prompts.  Office hours are 8:00 a.m. to 4:30 p.m. Monday - Friday. Please note that voicemails left after 4:00 p.m. may not be returned until the following business day.  We are closed weekends and major holidays. You have access to a nurse at all times for urgent questions. Please call the main number to the clinic Dept: [phone] and follow the prompts.   For any non-urgent questions, you may also contact your provider using MyChart. We now offer e-Visits for anyone 36 and older to request care online for non-urgent symptoms. For details visit [email or web address].   Also download the MyChart app! Go to the app store, search "MyChart", open the app, select [facility], and log in with your MyChart username and password.  Due to Covid, a mask is required upon entering the hospital/clinic. If you do not have a mask, one will be given to you upon arrival. For doctor visits, patients may have 1 support person aged 3 or older with them. For treatment visits, patients cannot have anyone with them due to current Covid guidelines and our immunocompromised population.  Oxaliplatin (Eloxatin) What is this medication? OXALIPLATIN (ox AL i PLA tin) is a chemotherapy drug. It targets fast dividing cells, like cancer cells, and causes these cells to die. This medicine is used to treat cancers of the colon and rectum, and many other cancers. This medicine may be used for  other purposes; ask your health care provider or pharmacist if you have questions. COMMON BRAND NAME(S): Eloxatin What should I tell my care team before I take this medication? They need to know if you have any of these conditions: heart disease history of irregular heartbeat liver disease low  blood counts, like white cells, platelets, or red blood cells lung or breathing disease, like asthma take medicines that treat or prevent blood clots tingling of the fingers or toes, or other nerve disorder an unusual or allergic reaction to oxaliplatin, other chemotherapy, other medicines, foods, dyes, or preservatives pregnant or trying to get pregnant breast-feeding How should I use this medication? This drug is given as an infusion into a vein. It is administered in a hospital or clinic by a specially trained health care professional. Talk to your pediatrician regarding the use of this medicine in children. Special care may be needed. Overdosage: If you think you have taken too much of this medicine contact a poison control center or emergency room at once. NOTE: This medicine is only for you. Do not share this medicine with others. What if I miss a dose? It is important not to miss a dose. Call your doctor or health care professional if you are unable to keep an appointment. What may interact with this medication? Do not take this medicine with any of the following medications: cisapride dronedarone pimozide thioridazine This medicine may also interact with the following medications: aspirin and aspirin-like medicines certain medicines that treat or prevent blood clots like warfarin, apixaban, dabigatran, and rivaroxaban cisplatin cyclosporine diuretics medicines for infection like acyclovir, adefovir, amphotericin B, bacitracin, cidofovir, foscarnet, ganciclovir, gentamicin, pentamidine, vancomycin NSAIDs, medicines for pain and inflammation, like ibuprofen or naproxen other medicines that prolong the QT interval (an abnormal heart rhythm) pamidronate zoledronic acid This list may not describe all possible interactions. Give your health care provider a list of all the medicines, herbs, non-prescription drugs, or dietary supplements you use. Also tell them if you smoke, drink  alcohol, or use illegal drugs. Some items may interact with your medicine. What should I watch for while using this medication? Your condition will be monitored carefully while you are receiving this medicine. You may need blood work done while you are taking this medicine. This medicine may make you feel generally unwell. This is not uncommon as chemotherapy can affect healthy cells as well as cancer cells. Report any side effects. Continue your course of treatment even though you feel ill unless your healthcare professional tells you to stop. This medicine can make you more sensitive to cold. Do not drink cold drinks or use ice. Cover exposed skin before coming in contact with cold temperatures or cold objects. When out in cold weather wear warm clothing and cover your mouth and nose to warm the air that goes into your lungs. Tell your doctor if you get sensitive to the cold. Do not become pregnant while taking this medicine or for 9 months after stopping it. Women should inform their health care professional if they wish to become pregnant or think they might be pregnant. Men should not father a child while taking this medicine and for 6 months after stopping it. There is potential for serious side effects to an unborn child. Talk to your health care professional for more information. Do not breast-feed a child while taking this medicine or for 3 months after stopping it. This medicine has caused ovarian failure in some women. This medicine may make it more difficult  to get pregnant. Talk to your health care professional if you are concerned about your fertility. This medicine has caused decreased sperm counts in some men. This may make it more difficult to father a child. Talk to your health care professional if you are concerned about your fertility. This medicine may increase your risk of getting an infection. Call your health care professional for advice if you get a fever, chills, or sore throat,  or other symptoms of a cold or flu. Do not treat yourself. Try to avoid being around people who are sick. Avoid taking medicines that contain aspirin, acetaminophen, ibuprofen, naproxen, or ketoprofen unless instructed by your health care professional. These medicines may hide a fever. Be careful brushing or flossing your teeth or using a toothpick because you may get an infection or bleed more easily. If you have any dental work done, tell your dentist you are receiving this medicine. What side effects may I notice from receiving this medication? Side effects that you should report to your doctor or health care professional as soon as possible: allergic reactions like skin rash, itching or hives, swelling of the face, lips, or tongue breathing problems cough low blood counts - this medicine may decrease the number of white blood cells, red blood cells, and platelets. You may be at increased risk for infections and bleeding nausea, vomiting pain, redness, or irritation at site where injected pain, tingling, numbness in the hands or feet signs and symptoms of bleeding such as bloody or black, tarry stools; red or dark brown urine; spitting up blood or brown material that looks like coffee grounds; red spots on the skin; unusual bruising or bleeding from the eyes, gums, or nose signs and symptoms of a dangerous change in heartbeat or heart rhythm like chest pain; dizziness; fast, irregular heartbeat; palpitations; feeling faint or lightheaded; falls signs and symptoms of infection like fever; chills; cough; sore throat; pain or trouble passing urine signs and symptoms of liver injury like dark yellow or brown urine; general ill feeling or flu-like symptoms; light-colored stools; loss of appetite; nausea; right upper belly pain; unusually weak or tired; yellowing of the eyes or skin signs and symptoms of low red blood cells or anemia such as unusually weak or tired; feeling faint or lightheaded;  falls signs and symptoms of muscle injury like dark urine; trouble passing urine or change in the amount of urine; unusually weak or tired; muscle pain; back pain Side effects that usually do not require medical attention (report to your doctor or health care professional if they continue or are bothersome): changes in taste diarrhea gas hair loss loss of appetite mouth sores This list may not describe all possible side effects. Call your doctor for medical advice about side effects. You may report side effects to FDA at 1-800-FDA-1088. Where should I keep my medication? This drug is given in a hospital or clinic and will not be stored at home. NOTE: This sheet is a summary. It may not cover all possible information. If you have questions about this medicine, talk to your doctor, pharmacist, or health care provider.  2022 [employer]/Gold Standard (2021-07-12 00:00:00)

## 2021-10-17 ENCOUNTER — Ambulatory Visit
Admission: RE | Admit: 2021-10-17 | Discharge: 2021-10-17 | Disposition: A | Discharge status: Home / Self Care | Payer: Medicare Other | Source: Ambulatory Visit | Attending: Radiation Oncology | Admitting: Radiation Oncology

## 2021-10-17 DIAGNOSIS — C7800 Secondary malignant neoplasm of unspecified lung: Secondary | ICD-10-CM

## 2021-10-21 ENCOUNTER — Encounter: Payer: Self-pay | Admitting: Hematology

## 2021-10-21 ENCOUNTER — Other Ambulatory Visit: Payer: Self-pay | Admitting: Student

## 2021-10-21 ENCOUNTER — Other Ambulatory Visit: Payer: Medicare Other

## 2021-10-21 ENCOUNTER — Ambulatory Visit (HOSPITAL_BASED_OUTPATIENT_CLINIC_OR_DEPARTMENT_OTHER): Payer: Medicare Other | Admitting: Hematology

## 2021-10-21 DIAGNOSIS — C20 Malignant neoplasm of rectum: Secondary | ICD-10-CM | POA: Diagnosis not present

## 2021-10-21 NOTE — Progress Notes (Signed)
[place]   [phone] [phone]   Clinic Follow up Note   Patient Care Team: Pcp, No as PCP - General  Date of Service:  10/21/2021  I connected with Jorge [name] on 10/21/21 at 4:00pm by telephone and verified that I am speaking with the correct person using two identifiers.   I discussed the limitations, risks, security and privacy concerns of performing an evaluation and management service by telephone and the availability of in person appointments. I also discussed with the patient that there may be a patient responsible charge related to this service. The patient expressed understanding and agreed to proceed.   Patient's location:  Home  Provider's location:  Office    CHIEF COMPLAINT: f/u of rectal cancer  CURRENT THERAPY:  First line chemo CAPOX every 3 weeks, started on 10/14/2021  ASSESSMENT & PLAN:  Jorge [name] is a 70 y.o. male with   1. Rectal Adenocarcinoma, [id number] with lung metastasis,MMR normal  -diagnosed in 07/2021 -he has biopsy-proven lung metastasis -Status post palliative radiation, with excellent response and significant improvement of rectal pain -He now has started systemic treatment with first-line CAPOX, status post first cycle last week.  He overall tolerated very well  2. Symptom Management: Rectal pain, weight loss, fatigue, constipation -His rectal pain has much improved after radiation -He is taking less pain medication now -He has developed a worsening constipation after first cycle of chemotherapy, we reviewed constipation management      PLAN:  -We reviewed constipation management -He tolerating chemotherapy well, will continue to finish first cycle of Xeloda in a week -He will return in 2 weeks for cycle 2 chemotherapy CAPOX    No problem-specific Assessment & Plan notes found for this encounter.   SUMMARY OF ONCOLOGIC HISTORY: Oncology History Overview Note  Cancer Staging Rectal  cancer [facility]) Staging form: Colon and Rectum, AJCC 8th Edition - Clinical stage from 08/29/2021: [name], cM1 - Signed by [name], MD on 08/29/2021    Rectal cancer ([place])  07/23/2021 Imaging   CT AP  IMPRESSION: 1. Appearance of the rectum likely indicates a rectal mass suspicious for rectal carcinoma. Inflammatory process would be less likely. Direct visualization is suggested. 2. Cholelithiasis without evidence of acute cholecystitis. 3. Aortic atherosclerosis.   08/19/2021 Procedure   Colonoscopy, under Dr. [name]  Impression: - Rectal tenderness, palpable rectal mass and hemorrhoids found on digital rectal exam. - Stool in the entire examined colon - lavaged with still inadequate clearance. - One 35 mm polyp at the hepatic flexure. Biopsied. Tattooed distal in case future endoscopic resection is considered - however, has larger issues with rectal mass currently as below. - Four, 5 to 11 mm polyps in the transverse colon and at the hepatic flexure, removed with a cold snare. Resected and retrieved. - Diverticulosis in the recto-sigmoid colon and in the sigmoid colon. - Rule out malignancy, partially obstructing tumor in the rectum and from 6 to 13 cm proximal to the anus. Biopsied.   08/19/2021 Pathology Results   Diagnosis 1. Hepatic Flexure Biopsy - TUBULAR ADENOMA WITHOUT HIGH-GRADE DYSPLASIA OR MALIGNANCY 2. Transverse Colon Biopsy, and hepatic flexure, polyps (4) - TUBULAR ADENOMA WITHOUT HIGH-GRADE DYSPLASIA OR MALIGNANCY - OTHER FRAGMENTS OF POLYPOID COLONIC MUCOSA WITH NO SPECIFIC HISTOPATHOLOGIC CHANGES - FOOD MATERIAL 3. Rectum, biopsy - ADENOCARCINOMA. SEE NOTE   08/22/2021 Initial Diagnosis   Rectal cancer ([place])   08/26/2021 Imaging   IMPRESSION: 1. A 2.2 x 1.9 cm right middle  lobe pulmonary nodule as well as a total of four right upper lobe pulmonary nodule and masses measuring up to 3.6 cm. Findings concerning for metastatic primary lung cancer versus  less likely metastases in a patient with rectal cancer. Additional imaging evaluation or consultation with Pulmonology or Thoracic Surgery recommended. 2. No gross hilar adenopathy, noting limited sensitivity for the detection of hilar adenopathy on this noncontrast study. 3. Cholelithiasis. 4.  Emphysema (ICD10-J43.9). 5. At least left anterior descending coronary artery calcifications.   08/27/2021 Imaging   IMPRESSION: Rectal adenocarcinoma T stage: T4 B   Rectal adenocarcinoma N stage: N2 disease likely associated with early extra mesorectal lymph node involvement.   Distance from tumor to the internal anal sphincter is 1.2 cm.   Also with extramural venous involvement as described.   08/29/2021 Cancer Staging   Staging form: Colon and Rectum, AJCC 8th Edition - Clinical stage from 08/29/2021: [name], cM1 - Signed by [name], MD on 08/29/2021 Stage prefix: Initial diagnosis    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 [facility])  10/06/2021 Initial Diagnosis   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[place])   10/14/2021 -  Chemotherapy   Patient is on Treatment Plan : COLORECTAL CapeOx + Bevacizumab q21d        INTERVAL HISTORY:  Jorge [name] is scheduled for a toxicity checkup today but it did not come.  I called him and spoke with patient and her daughter on the phone.  He overall tolerated first cycle chemotherapy well, with minimal cold sensitivity, however developed moderate constipation.  He has been using Dulcolax helped.  No other new complaints.  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 ([place]) 08/19/2021    SURGICAL HISTORY: Past Surgical History:  Procedure Laterality Date   BRONCHIAL BIOPSY  09/27/2021   Procedure: BRONCHIAL BIOPSIES;  Surgeon: Garner Nash, DO;  Location: [facility];  Service: Pulmonary;;   BRONCHIAL BRUSHINGS  09/27/2021   Procedure: BRONCHIAL  BRUSHINGS;  Surgeon: Garner Nash, DO;  Location: [facility];  Service: Pulmonary;;   BRONCHIAL NEEDLE ASPIRATION BIOPSY  09/27/2021   Procedure: BRONCHIAL NEEDLE ASPIRATION BIOPSIES;  Surgeon: Garner Nash, DO;  Location: [facility];  Service: Pulmonary;;   COLONOSCOPY     left breast surgery Left 1968   VIDEO BRONCHOSCOPY WITH RADIAL ENDOBRONCHIAL ULTRASOUND  09/27/2021   Procedure: RADIAL ENDOBRONCHIAL ULTRASOUND;  Surgeon: Garner Nash, DO;  Location: [facility];  Service: Pulmonary;;    I have reviewed the social history and family history with the patient and they are unchanged from previous note.  ALLERGIES:  has No Known Allergies.  MEDICATIONS:  Current Outpatient Medications  Medication Sig Dispense Refill   capecitabine (XELODA) 500 MG tablet Take 3 tablets (1,500 mg total) by mouth 2 (two) times daily after a meal. Take on day 1-14 every 21 days 84 tablet 0   carbonyl iron (FEOSOL) 45 MG TABS tablet Take 45 mg by mouth daily.     dexamethasone (DECADRON) 4 MG tablet Take 2 tablets (8 mg total) by mouth daily. Start the day after chemotherapy for 2 days. Take with food. 30 tablet 1   lidocaine-prilocaine (EMLA) cream Apply 1 application topically as needed. 30 g 2   lidocaine-prilocaine (EMLA) cream Apply to affected area once 30 g 3   loperamide (IMODIUM A-D) 2 MG tablet Take 4 mg by mouth 4 (four) times daily as needed for diarrhea  or loose stools.     LORazepam (ATIVAN) 0.5 MG tablet Take 1 tablet (0.5 mg total) by mouth every 6 (six) hours as needed (Nausea or vomiting). 30 tablet 0   morphine (MS CONTIN) 15 MG 12 hr tablet Take 1 tablet (15 mg total) by mouth every 12 (twelve) hours. 60 tablet 0   Multiple Vitamin (MULTIVITAMIN WITH MINERALS) TABS tablet Take 1 tablet by mouth daily.     ondansetron (ZOFRAN) 8 MG tablet Take 1 tablet (8 mg total) by mouth every 8 (eight) hours as needed for nausea or vomiting. 30 tablet 0   ondansetron (ZOFRAN) 8 MG tablet  Take 1 tablet (8 mg total) by mouth 2 (two) times daily as needed for refractory nausea / vomiting. Start on day 3 after chemotherapy. 30 tablet 1   Oxycodone HCl 10 MG TABS Take 1 tablet (10 mg total) by mouth every 4 (four) hours as needed. 90 tablet 0   prochlorperazine (COMPAZINE) 10 MG tablet Take 1 tablet (10 mg total) by mouth every 6 (six) hours as needed for nausea or vomiting. 30 tablet 0   prochlorperazine (COMPAZINE) 10 MG tablet Take 1 tablet (10 mg total) by mouth every 6 (six) hours as needed (Nausea or vomiting). 30 tablet 1   No current facility-administered medications for this visit.    PHYSICAL EXAMINATION: ECOG PERFORMANCE STATUS: 1 - Symptomatic but completely ambulatory  There were no vitals filed for this visit.  Wt Readings from Last 3 Encounters:  10/14/21 136 lb 12 oz (62 kg)  10/06/21 137 lb 14.4 oz (62.6 kg)  10/03/21 138 lb 3.2 oz (62.7 kg)      LABORATORY DATA:  I have reviewed the data as listed CBC Latest Ref Rng & Units 10/14/2021 09/23/2021 09/14/2021  WBC 4.0 - 10.5 K/uL 3.5(L) 4.2 6.0  Hemoglobin 13.0 - 17.0 g/dL 12.7(L) 12.8(L) 11.6(L)  Hematocrit 39.0 - 52.0 % 40.4 40.5 36.2(L)  Platelets 150 - 400 K/uL 258 286 360     CMP Latest Ref Rng & Units 10/14/2021 09/23/2021 09/14/2021  Glucose 70 - 99 mg/dL 65(L) 96 98  BUN 8 - 23 mg/dL '11 14 14  ' Creatinine 0.61 - 1.24 mg/dL 0.95 0.89 0.86  Sodium 135 - 145 mmol/L 138 136 134(L)  Potassium 3.5 - 5.1 mmol/L 3.9 3.9 4.5  Chloride 98 - 111 mmol/L 103 100 99  CO2 22 - 32 mmol/L '27 27 27  ' Calcium 8.9 - 10.3 mg/dL 8.8(L) 9.0 8.9  Total Protein 6.5 - 8.1 g/dL 7.2 8.2(H) 7.7  Total Bilirubin 0.3 - 1.2 mg/dL 0.3 0.3 0.4  Alkaline Phos 38 - 126 U/L 95 89 92  AST 15 - 41 U/L 61(H) 31 21  ALT 0 - 44 U/L 64(H) 26 16      RADIOGRAPHIC STUDIES: I have personally reviewed the radiological images as listed and agreed with the findings in the report. No results found.    No orders of the defined types  were placed in this encounter.  I discussed the assessment and treatment plan with the patient. The patient was provided an opportunity to ask questions and all were answered. The patient agreed with the plan and demonstrated an understanding of the instructions.   The patient was advised to call back or seek an in-person evaluation if the symptoms worsen or if the condition fails to improve as anticipated.  I provided 12 minutes of non face-to-face telephone visit time during this encounter, and > 50% was spent counseling as  documented under my assessment & plan.    [name], MD 10/21/2021

## 2021-10-24 ENCOUNTER — Telehealth: Payer: Self-pay | Admitting: Hematology

## 2021-10-24 ENCOUNTER — Encounter (HOSPITAL_COMMUNITY): Payer: Self-pay

## 2021-10-24 ENCOUNTER — Other Ambulatory Visit: Payer: Self-pay

## 2021-10-24 ENCOUNTER — Ambulatory Visit (HOSPITAL_COMMUNITY)
Admission: RE | Admit: 2021-10-24 | Discharge: 2021-10-24 | Disposition: A | Discharge status: Home / Self Care | Payer: Medicare Other | Source: Ambulatory Visit | Attending: Physician Assistant | Admitting: Physician Assistant

## 2021-10-24 DIAGNOSIS — C20 Malignant neoplasm of rectum: Secondary | ICD-10-CM | POA: Insufficient documentation

## 2021-10-24 HISTORY — PX: IR IMAGING GUIDED PORT INSERTION: IMG5740

## 2021-10-24 IMAGING — US IR IMAGING GUIDED PORT INSERTION
1 series · 1 of 1 positions shown · non-contrast
Comparison: None.

INDICATION: Metastatic rectal cancer.  Port-A-Cath needed for chemotherapy.

EXAM:
FLUOROSCOPIC AND ULTRASOUND GUIDED PLACEMENT OF A SUBCUTANEOUS PORT

[Series 1: ir fluoro/shunt/fist · 1 of 1 slices shown]
[im 1/1]
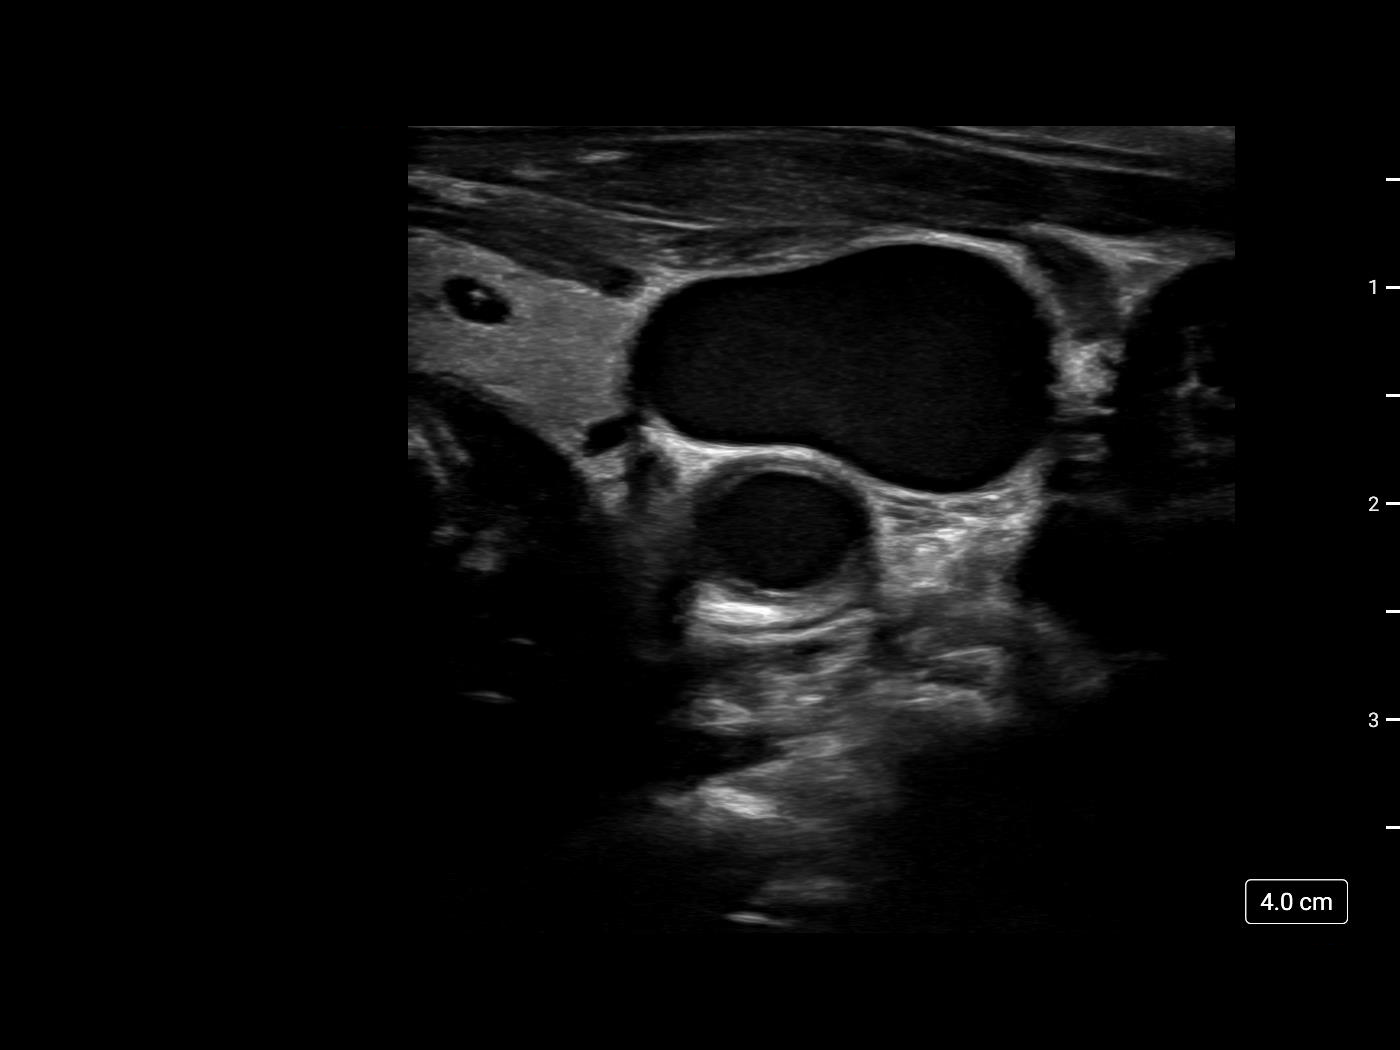

[1 of 1 positions shown; findings below may reference images not displayed]

MEDICATIONS:
Moderate sedation

ANESTHESIA/SEDATION:
Moderate (conscious) sedation was employed during this procedure. A
total of Versed 4.0mg and fentanyl 100 mcg was administered
intravenously at the order of the provider performing the procedure.

Total intra-service moderate sedation time: 31 minutes.

Patient's level of consciousness and vital signs were monitored
continuously by radiology nurse throughout the procedure under the
supervision of the provider performing the procedure.

FLUOROSCOPY TIME:  24 seconds, 1 mGy

COMPLICATIONS:
None immediate.

PROCEDURE:
The procedure, risks, benefits, and alternatives were explained to
the patient. Questions regarding the procedure were encouraged and
answered. The patient understands and consents to the procedure.

Patient was placed supine on the interventional table. Ultrasound
confirmed a patent right internal jugular vein. Ultrasound image was
saved for documentation. The right chest and neck were cleaned with
a skin antiseptic and a sterile drape was placed. Maximal barrier
sterile technique was utilized including caps, mask, sterile gowns,
sterile gloves, sterile drape, hand hygiene and skin antiseptic. The
right neck was anesthetized with 1% lidocaine. Small incision was
made in the right neck with a blade. Micropuncture set was placed in
the right internal jugular vein with ultrasound guidance. The
micropuncture wire was used for measurement purposes. The right
chest was anesthetized with 1% lidocaine. #15 blade was used to make
an incision and a subcutaneous port pocket was formed. 8 french
Power Port was assembled. Subcutaneous tunnel was formed with a
stiff tunneling device. The port catheter was brought through the
subcutaneous tunnel. The port was placed in the subcutaneous pocket.
The micropuncture set was exchanged for a peel-away sheath. The
catheter was placed through the peel-away sheath and the tip was
positioned at the superior cavoatrial junction. Catheter placement
was confirmed with fluoroscopy. The port was accessed and flushed
with heparinized saline. The port pocket was closed using two layers
of absorbable sutures and Dermabond. The vein skin site was closed
using a single layer of absorbable suture and Dermabond. Sterile
dressings were applied. Patient tolerated the procedure well without
an immediate complication. Ultrasound and fluoroscopic images were
taken and saved for this procedure.
IMPRESSION: Placement of a subcutaneous power-injectable port device. Catheter
tip at the superior cavoatrial junction.

## 2021-10-24 MED ORDER — FENTANYL CITRATE (PF) 100 MCG/2ML IJ SOLN
INTRAMUSCULAR | Status: AC | PRN
  Administered 2021-10-24 (×2): 50 ug via INTRAVENOUS

## 2021-10-24 MED ORDER — FENTANYL CITRATE (PF) 100 MCG/2ML IJ SOLN
INTRAMUSCULAR | Status: AC
  Filled 2021-10-24: qty 2

## 2021-10-24 MED ORDER — SODIUM CHLORIDE 0.9 % IV SOLN: INTRAVENOUS | Status: DC

## 2021-10-24 MED ORDER — LIDOCAINE HCL 1 % IJ SOLN
INTRAMUSCULAR | Status: AC
  Filled 2021-10-24: qty 20

## 2021-10-24 MED ORDER — HEPARIN SOD (PORK) LOCK FLUSH 100 UNIT/ML IV SOLN
INTRAVENOUS | Status: AC
  Filled 2021-10-24: qty 5

## 2021-10-24 MED ORDER — HEPARIN SOD (PORK) LOCK FLUSH 100 UNIT/ML IV SOLN
INTRAVENOUS | Status: AC | PRN
  Administered 2021-10-24: 500 [IU] via INTRAVENOUS

## 2021-10-24 MED ORDER — LIDOCAINE HCL (PF) 1 % IJ SOLN
INTRAMUSCULAR | Status: AC
  Filled 2021-10-24: qty 30

## 2021-10-24 MED ORDER — LIDOCAINE HCL (PF) 1 % IJ SOLN
INTRAMUSCULAR | Status: AC | PRN
  Administered 2021-10-24: 20 mL

## 2021-10-24 MED ORDER — MIDAZOLAM HCL 2 MG/2ML IJ SOLN
INTRAMUSCULAR | Status: AC
  Filled 2021-10-24: qty 4

## 2021-10-24 MED ORDER — MIDAZOLAM HCL 2 MG/2ML IJ SOLN
INTRAMUSCULAR | Status: AC | PRN
  Administered 2021-10-24 (×4): 1 mg via INTRAVENOUS

## 2021-10-24 NOTE — Progress Notes (Signed)
°  Radiation Oncology         [phone]) [phone] ________________________________  Name: Jorge [name] MRN: [id number]  Date of Service: 10/17/2021  DOB: [birth year]  Post Treatment Telephone Note  Diagnosis:   Stage IIIC, [phone], Adenocarcinoma of the rectum  Interval Since Last Radiation:  4 weeks   09/06/2021 through 09/26/2021 Site Technique Total Dose (Gy) Dose per Fx (Gy) Completed Fx Beam Energies  Rectum: Rectum 3D 37.5/37.5 2.5 15/15 10X, 15X   Narrative:  The patient was contacted today for routine follow-up. During treatment he did very well with radiotherapy and did not have significant desquamation. He is getting a PAC placed today for more systemic therapy. It appears that [name] anticipates CAPOX.  Impression/Plan: 1. Stage IIIC, [phone], Adenocarcinoma of the rectum. The patient has been doing well since completion of radiotherapy. We discussed that we would be happy to continue to follow him as needed, but he will also continue to follow up with [name]  in medical oncology and Dr. Marcello Moores in [place].      Carola Rhine, PAC

## 2021-10-24 NOTE — Procedures (Signed)
Interventional Radiology Procedure:   Indications: Metastatic rectal cancer  Procedure: Port placement  Findings: Right jugular port, tip at SVC/RA junction  Complications: None     EBL: Minimal, less than 10 ml  Plan: Discharge in one hour.  Keep port site and incisions dry for at least 24 hours.     Arnetta Odeh R. Anselm Pancoast, MD  Pager: [phone]

## 2021-10-24 NOTE — Telephone Encounter (Signed)
Scheduled per sch msg. Called and left msg  

## 2021-10-24 NOTE — Consult Note (Signed)
Chief Complaint: Patient was seen in consultation today for Port-A-Cath placement  Referring Physician(s): Feng,Y  Supervising Physician: Markus Daft  Patient Status: [place]  History of Present Illness: Jorge [name] is a 70 y.o. male smoker with history of anemia and metastatic rectal carcinoma diagnosed in September of this year.  He presents today for Port-A-Cath placement to assist with chemotherapy treatment.  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 ([place]) 08/19/2021    Past Surgical History:  Procedure Laterality Date   BRONCHIAL BIOPSY  09/27/2021   Procedure: BRONCHIAL BIOPSIES;  Surgeon: Garner Nash, DO;  Location: [facility];  Service: Pulmonary;;   BRONCHIAL BRUSHINGS  09/27/2021   Procedure: BRONCHIAL BRUSHINGS;  Surgeon: Garner Nash, DO;  Location: [facility];  Service: Pulmonary;;   BRONCHIAL NEEDLE ASPIRATION BIOPSY  09/27/2021   Procedure: BRONCHIAL NEEDLE ASPIRATION BIOPSIES;  Surgeon: Garner Nash, DO;  Location: [facility];  Service: Pulmonary;;   COLONOSCOPY     left breast surgery Left 1968   VIDEO BRONCHOSCOPY WITH RADIAL ENDOBRONCHIAL ULTRASOUND  09/27/2021   Procedure: RADIAL ENDOBRONCHIAL ULTRASOUND;  Surgeon: Garner Nash, DO;  Location: [facility];  Service: Pulmonary;;    Allergies: Patient has no known allergies.  Medications: Prior to Admission medications   Medication Sig Start Date End Date Taking? Authorizing Provider  capecitabine (XELODA) 500 MG tablet Take 3 tablets (1,500 mg total) by mouth 2 (two) times daily after a meal. Take on day 1-14 every 21 days 10/06/21  Yes [name], MD  carbonyl iron (FEOSOL) 45 MG TABS tablet Take 45 mg by mouth daily.   Yes [provider]  morphine (MS CONTIN) 15 MG 12 hr tablet Take 1 tablet (15 mg total) by mouth every 12 (twelve) hours. 10/03/21  Yes [name], MD  Multiple Vitamin  (MULTIVITAMIN WITH MINERALS) TABS tablet Take 1 tablet by mouth daily.   Yes [provider]  Oxycodone HCl 10 MG TABS Take 1 tablet (10 mg total) by mouth every 4 (four) hours as needed. 10/14/21  Yes [name], MD  dexamethasone (DECADRON) 4 MG tablet Take 2 tablets (8 mg total) by mouth daily. Start the day after chemotherapy for 2 days. Take with food. 10/14/21   [name], MD  lidocaine-prilocaine (EMLA) cream Apply 1 application topically as needed. 10/06/21   Heilingoetter, Cassandra L, PA-C  lidocaine-prilocaine (EMLA) cream Apply to affected area once 10/14/21   [name], MD  loperamide (IMODIUM A-D) 2 MG tablet Take 4 mg by mouth 4 (four) times daily as needed for diarrhea or loose stools.    [provider]  LORazepam (ATIVAN) 0.5 MG tablet Take 1 tablet (0.5 mg total) by mouth every 6 (six) hours as needed (Nausea or vomiting). 10/14/21   [name], MD  ondansetron (ZOFRAN) 8 MG tablet Take 1 tablet (8 mg total) by mouth every 8 (eight) hours as needed for nausea or vomiting. 09/09/21   [name], MD  ondansetron (ZOFRAN) 8 MG tablet Take 1 tablet (8 mg total) by mouth 2 (two) times daily as needed for refractory nausea / vomiting. Start on day 3 after chemotherapy. 10/14/21   [name], MD  prochlorperazine (COMPAZINE) 10 MG tablet Take 1 tablet (10 mg total) by mouth every 6 (six) hours as needed for nausea or vomiting. 09/09/21   [name], MD  prochlorperazine (COMPAZINE) 10 MG tablet Take 1 tablet (10 mg  total) by mouth every 6 (six) hours as needed (Nausea or vomiting). 10/14/21   [name], MD     Family History  Problem Relation Age of Onset   Cancer Mother 76       unknown type cancer   Colon cancer Neg Hx    Esophageal cancer Neg Hx    Rectal cancer Neg Hx    Stomach cancer Neg Hx     Social History   Socioeconomic History   Marital status: Widowed    Spouse name: Not on file   Number of children: 5   Years of education: Not on file   Highest education  level: Not on file  Occupational History   Not on file  Tobacco Use   Smoking status: Some Days    Packs/day: 0.25    Years: 20.00    Pack years: 5.00    Types: Cigarettes   Smokeless tobacco: Never  Vaping Use   Vaping Use: Never used  Substance and Sexual Activity   Alcohol use: Not Currently    Comment: 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, stopped in 02/2021   Drug use: Never   Sexual activity: Not on file  Other Topics Concern   Not on file  Social History Narrative   Not on file   Social Determinants of Health   Financial Resource Strain: Not on file  Food Insecurity: Not on file  Transportation Needs: Not on file  Physical Activity: Not on file  Stress: Not on file  Social Connections: Not on file      Review of Systems currently denies fever, headache, chest pain, worsening dyspnea, cough, abdominal/back pain, nausea, vomiting or bleeding.  He has had weight loss.  Vital Signs: BP (!) 150/79    Pulse 63    Temp 99.2 F (37.3 C) (Oral)    Resp 18    SpO2 100%   Physical Exam awake, alert.  Chest with distant but clear breath sounds bilaterally.  Heart with regular rate and rhythm.  Abdomen soft, positive bowel sounds, nontender.  No lower extremity edema   Imaging: MR PELVIS W WO CONTRAST  Result Date: 10/04/2021 CLINICAL DATA:  History of rectal cancer with large rectal mass and adenopathy previously evaluated with MRI. EXAM: MRI PELVIS WITHOUT AND WITH CONTRAST TECHNIQUE: Multiplanar multisequence MR imaging of the pelvis was performed both before and after administration of intravenous contrast. Ultrasound gel was administered per rectum to optimize tumor evaluation. CONTRAST:  74mL GADAVIST GADOBUTROL 1 MMOL/ML IV SOLN COMPARISON:  Comparison is made with MR evaluation from October of 2022. FINDINGS: TUMOR LOCATION Tumor distance from Anal Verge/Skin Surface: Approximately 5 cm. Cm Tumor distance from Internal Anal Sphincter: Approximally 1 cm to 1.2 cm. Cm  TUMOR DESCRIPTION Circumferential Extent: Circumferential involvement more so along the RIGHT lateral rectum. Tumor Length: As great as 8 cm, between 7.5 an 8 cm greatest length. A mixture of T2 hyperintensity and intermediate T2 signal remains present in the tumor. Low signal is present along the RIGHT lateral margin of the tumor previously intermediate T2 signal in this area. This likely represents a mixture of post treatment changes and residual tumor. Overall the tumor appears less bulky than on the previous study. Along the RIGHT lateral margin direct extension crossing fascial boundaries on the previous study now interrupted by low T2 signal and clear delineation between the rectal wall and soft tissue which persists outside of the mesorectum. Approximately 1.6 as compared to 2.0 cm on  the current study. High T2 signal along the high rectum at the rectosigmoid transition still mixed with some intermediate signal measuring approximately 1.5 as compared to 2.5 cm greatest thickness. Bulging to the RIGHT at this location of the rectal and colonic contour. Some residual enhancement of colonic thickening. T - CATEGORY Extension through [employer]: Signs of T4b tumor with frank involvement of the anterior mesorectum, posterior mesorectum and seminal vesicles quite similar to previous imaging. Shortest Distance of any tumor/node from Mesorectal Fascia: 0 mm Extramural Vascular Invasion/Tumor Thrombus: No Invasion of Anterior Peritoneal Reflection: Yes Involvement of Adjacent Organs or Pelvic Sidewall: Yes Levator Ani Involvement: Abutment of RIGHT and LEFT pelvic floor musculature with thickening of posterior mesorectum and presacral soft tissue as before, extending along the levator plate and just to the RIGHT and LEFT of midline. N - CATEGORY Mesorectal Lymph Nodes >=58mm: Mesorectal lymph nodes and superior rectal lymph nodes have nearly completely resolved in the interval. Suspected there was RIGHT pelvic  sidewall nodal involvement on the previous study which has diminished in size since the prior exam (image 7/8 on the previous exam a 7 mm irregular lymph node now approximately 5 mm showing abnormal signal. Clustered nodes along the superior rectal vein have all but resolved, the single remaining node measuring 5 mm on image 12 of series 3 with the largest lymph node in this area previously 6 mm on the prior exam. Extra-mesorectal Lymphadenopathy: Yes Other: Urinary Tract: No distal ureteral dilation. Urinary bladder with smooth contours. Bowel: Rectal cancer description as described. Mild bowel distension in the lower abdomen of small bowel, likely ileum suggestion of some bowel edema (image 11/3). Vascular/Lymphatic: Small pelvic sidewall lymph nodes are diminished in size, attention on follow-up. Vascular structures in the pelvis are patent. Reproductive: Seminal vesicle involvement. Other: Bulging of RIGHT lateral high rectum at the site of tumor without discernible rectal wall as compared to the contralateral rectum best seen on coronal image 21 through 27. Also on contrasted coronal imaging where small foci of non enhancement are seen along the rectal margin (image 15/30 these measure 12 and 9 mm respectively. Musculoskeletal: Choose 1 IMPRESSION: Findings suspicious for T4b tumor with mesorectum and seminal vesicles involvement. Overall findings appear less pronounced than on the previous study. Rectal cancer stage: Mild bowel distension in the lower abdomen of small bowel, likely with some bowel edema. Correlate with any clinical evidence of colitis. Signs of some response to therapy still with considerable rectal thickening and residual disease suspected on the current exam. Seminal vesicle involvement and involvement of the APR as before. Cystic changes along the RIGHT lateral rectal wall at the high rectum raising the question of treated disease beyond the rectal wall. The patient may be at risk for a  contained perforation while undergoing treatment given the extent of rectal tumor in this location has tumor involute. Correlate with any worsening abdominal symptoms. 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 These results will be called to the ordering clinician or representative by the Radiologist Assistant, and communication documented in the PACS or [employer]. Electronically Signed   By: Zetta Bills M.D.   On: 10/04/2021 12:20   DG CHEST PORT 1 VIEW  Result Date: 09/27/2021 CLINICAL DATA:  Post bronchoscopy with biopsy of RIGHT pulmonary lesions. EXAM: PORTABLE CHEST 1 VIEW COMPARISON:  None FINDINGS: RIGHT upper lobe and middle lobe masses and nodules better demonstrated on previous CT without gross change. EKG leads project over the chest. No signs of pneumothorax.  Skin folds noted along the LEFT peripheral chest about the scapula. Cardiomediastinal contours are stable. On limited assessment there is no acute skeletal process. IMPRESSION: No acute findings following biopsy, specifically no signs of pneumothorax. RIGHT upper lobe and middle lobe masses and nodules better demonstrated on previous CT without gross change. Electronically Signed   By: Zetta Bills M.D.   On: 09/27/2021 14:27   DG C-ARM BRONCHOSCOPY  Result Date: 09/27/2021 C-ARM BRONCHOSCOPY: Fluoroscopy was utilized by the requesting physician.  No radiographic interpretation.    Labs:  CBC: Recent Labs    09/06/21 0854 09/14/21 1457 09/23/21 1622 10/14/21 1223  WBC 7.6 6.0 4.2 3.5*  HGB 12.2* 11.6* 12.8* 12.7*  HCT 38.2* 36.2* 40.5 40.4  PLT 459* 360 286 258    COAGS: Recent Labs    08/11/21 1603  INR 1.1    BMP: Recent Labs    09/06/21 0854 09/14/21 1457 09/23/21 1622 10/14/21 1223  NA 137 134* 136 138  K 4.1 4.5 3.9 3.9  CL 101 99 100 103  CO2 28 27 27 27   GLUCOSE 102* 98 96 65*  BUN 9 14 14 11   CALCIUM 8.9 8.9 9.0 8.8*  CREATININE 0.83 0.86 0.89 0.95  GFRNONAA >60 >60 >60  >60    LIVER FUNCTION TESTS: Recent Labs    09/06/21 0854 09/14/21 1457 09/23/21 1622 10/14/21 1223  BILITOT 0.4 0.4 0.3 0.3  AST 18 21 31  61*  ALT 14 16 26  64*  ALKPHOS 103 92 89 95  PROT 8.1 7.7 8.2* 7.2  ALBUMIN 3.1* 2.9* 3.3* 3.1*    TUMOR MARKERS: Recent Labs    08/19/21 1043  CEA 561.0*    Assessment and Plan: 70 y.o. male smoker with history of anemia and metastatic rectal carcinoma diagnosed in September of this year.  He presents today for Port-A-Cath placement to assist with chemotherapy treatment.Risks and benefits of image guided port-a-catheter placement was discussed with the patient including, but not limited to bleeding, infection, pneumothorax, or fibrin sheath development and need for additional procedures.  All of the patient's questions were answered, patient is agreeable to proceed. Consent signed and in chart.    Thank you for this interesting consult.  I greatly enjoyed meeting Jorge [name] and look forward to participating in their care.  A copy of this report was sent to the requesting provider on this date.  Electronically Signed: D. Rowe Robert, PA-C 10/24/2021, 1:37 PM   I spent a total of   25 minutes  in face to face in clinical consultation, greater than 50% of which was counseling/coordinating care for Port-A-Cath placement

## 2021-10-27 ENCOUNTER — Telehealth: Payer: Self-pay | Admitting: *Deleted

## 2021-10-27 ENCOUNTER — Other Ambulatory Visit: Payer: Medicare Other

## 2021-10-27 ENCOUNTER — Other Ambulatory Visit: Payer: Self-pay | Admitting: Hematology

## 2021-10-27 ENCOUNTER — Encounter: Payer: Self-pay | Admitting: Hematology

## 2021-10-27 MED ORDER — OXYCODONE HCL 10 MG PO TABS
10.0000 mg | ORAL_TABLET | Freq: Three times a day (TID) | ORAL | 0 refills | Status: DC | PRN
Start: 2021-10-27 — End: 2021-11-15

## 2021-10-27 MED ORDER — MORPHINE SULFATE ER 15 MG PO TBCR
15.0000 mg | EXTENDED_RELEASE_TABLET | Freq: Two times a day (BID) | ORAL | 0 refills | Status: DC
Start: 2021-10-27 — End: 2021-11-24

## 2021-10-27 NOTE — Telephone Encounter (Signed)
Note on file incorrect patient, removed and added to correct patient

## 2021-10-27 NOTE — Telephone Encounter (Deleted)
Patients daughter Renita called to request refill under Dr Vaslow for Lacosamide to pharmacy on file. °  °She also wants to report that patient reported two different episodes yesterday  with sharp shooting pains in her head.  She has been slower in her thought processes and struggling to form her words and right arm stiffness. She has been off of the decadron for 1 week now.  Wants to know if she should return to a low dose of the Decadron or not. °  °Routing to provider. °

## 2021-10-28 ENCOUNTER — Other Ambulatory Visit (HOSPITAL_COMMUNITY): Payer: Self-pay

## 2021-11-01 ENCOUNTER — Other Ambulatory Visit: Payer: Self-pay | Admitting: Hematology

## 2021-11-01 ENCOUNTER — Other Ambulatory Visit (HOSPITAL_COMMUNITY): Payer: Self-pay

## 2021-11-02 ENCOUNTER — Other Ambulatory Visit: Payer: Self-pay

## 2021-11-02 DIAGNOSIS — C7800 Secondary malignant neoplasm of unspecified lung: Secondary | ICD-10-CM

## 2021-11-02 MED FILL — Dexamethasone Sodium Phosphate Inj 100 MG/10ML: INTRAMUSCULAR | Qty: 1 | Status: AC

## 2021-11-03 ENCOUNTER — Inpatient Hospital Stay (HOSPITAL_BASED_OUTPATIENT_CLINIC_OR_DEPARTMENT_OTHER): Payer: Medicare Other | Admitting: Hematology

## 2021-11-03 ENCOUNTER — Other Ambulatory Visit (HOSPITAL_COMMUNITY): Payer: Self-pay

## 2021-11-03 ENCOUNTER — Inpatient Hospital Stay: Payer: Medicare Other

## 2021-11-03 ENCOUNTER — Other Ambulatory Visit: Payer: Self-pay

## 2021-11-03 ENCOUNTER — Encounter: Payer: Self-pay | Admitting: Hematology

## 2021-11-03 ENCOUNTER — Telehealth: Payer: Self-pay

## 2021-11-03 VITALS — BP 150/79 | HR 73 | Temp 98.9°F | Resp 18 | Ht 71.0 in | Wt 137.1 lb

## 2021-11-03 DIAGNOSIS — Z95828 Presence of other vascular implants and grafts: Secondary | ICD-10-CM | POA: Insufficient documentation

## 2021-11-03 DIAGNOSIS — C20 Malignant neoplasm of rectum: Secondary | ICD-10-CM | POA: Diagnosis not present

## 2021-11-03 DIAGNOSIS — C7800 Secondary malignant neoplasm of unspecified lung: Secondary | ICD-10-CM

## 2021-11-03 DIAGNOSIS — Z5111 Encounter for antineoplastic chemotherapy: Secondary | ICD-10-CM | POA: Diagnosis not present

## 2021-11-03 LAB — CBC WITH DIFFERENTIAL (CANCER CENTER ONLY)
Abs Immature Granulocytes: 0 10*3/uL (ref 0.00–0.07)
Basophils Absolute: 0 10*3/uL (ref 0.0–0.1)
Basophils Relative: 1 %
Eosinophils Absolute: 0 10*3/uL (ref 0.0–0.5)
Eosinophils Relative: 0 %
HCT: 36.8 % — ABNORMAL LOW (ref 39.0–52.0)
Hemoglobin: 11.8 g/dL — ABNORMAL LOW (ref 13.0–17.0)
Immature Granulocytes: 0 %
Lymphocytes Relative: 32 %
Lymphs Abs: 1 10*3/uL (ref 0.7–4.0)
MCH: 27.4 pg (ref 26.0–34.0)
MCHC: 32.1 g/dL (ref 30.0–36.0)
MCV: 85.6 fL (ref 80.0–100.0)
Monocytes Absolute: 0.8 10*3/uL (ref 0.1–1.0)
Monocytes Relative: 27 %
Neutro Abs: 1.2 10*3/uL — ABNORMAL LOW (ref 1.7–7.7)
Neutrophils Relative %: 40 %
Platelet Count: 151 10*3/uL (ref 150–400)
RBC: 4.3 MIL/uL (ref 4.22–5.81)
RDW: 17.4 % — ABNORMAL HIGH (ref 11.5–15.5)
WBC Count: 3 10*3/uL — ABNORMAL LOW (ref 4.0–10.5)
nRBC: 0 % (ref 0.0–0.2)

## 2021-11-03 LAB — CMP (CANCER CENTER ONLY)
ALT: 357 U/L (ref 0–44)
AST: 438 U/L (ref 15–41)
Albumin: 3.2 g/dL — ABNORMAL LOW (ref 3.5–5.0)
Alkaline Phosphatase: 100 U/L (ref 38–126)
Anion gap: 3 — ABNORMAL LOW (ref 5–15)
BUN: 8 mg/dL (ref 8–23)
CO2: 29 mmol/L (ref 22–32)
Calcium: 8.8 mg/dL — ABNORMAL LOW (ref 8.9–10.3)
Chloride: 104 mmol/L (ref 98–111)
Creatinine: 0.82 mg/dL (ref 0.61–1.24)
GFR, Estimated: >60 mL/min (ref >60)
Glucose, Bld: 84 mg/dL (ref 70–99)
Potassium: 4.2 mmol/L (ref 3.5–5.1)
Sodium: 136 mmol/L (ref 135–145)
Total Bilirubin: 0.7 mg/dL (ref 0.3–1.2)
Total Protein: 6.9 g/dL (ref 6.5–8.1)

## 2021-11-03 MED ORDER — SODIUM CHLORIDE 0.9% FLUSH
10.0000 mL | Freq: Once | INTRAVENOUS | Status: AC
  Administered 2021-11-03: 13:00:00 10 mL

## 2021-11-03 MED ORDER — HEPARIN SOD (PORK) LOCK FLUSH 100 UNIT/ML IV SOLN
500.0000 [IU] | Freq: Once | INTRAVENOUS | Status: AC
  Administered 2021-11-03: 13:00:00 500 [IU]

## 2021-11-03 MED ORDER — SODIUM CHLORIDE 0.9% FLUSH
10.0000 mL | Freq: Once | INTRAVENOUS | Status: AC
  Administered 2021-11-03: 12:00:00 10 mL

## 2021-11-03 MED ORDER — CAPECITABINE 500 MG PO TABS
850.0000 mg/m2 | ORAL_TABLET | Freq: Two times a day (BID) | ORAL | 0 refills | Status: DC
  Filled 2021-11-03: qty 64, 11d supply, fill #0
  Filled 2021-11-04: qty 64, 21d supply, fill #0
  Filled 2021-11-18: qty 64, 11d supply, fill #0

## 2021-11-03 NOTE — Progress Notes (Signed)
[place]   [phone] Fax:(336) [phone]   Clinic Follow up Note   Patient Care Team: Pcp, No as PCP - General  Date of Service:  11/03/2021  CHIEF COMPLAINT: f/u of rectal cancer  CURRENT THERAPY:  First line chemo CAPOX every 3 weeks, started on 10/14/2021  ASSESSMENT & PLAN:  Jorge [name] is a 70 y.o. male with   1. 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 CT AP 07/23/21 showed rectal mass with no significant adenopathy or distant metastasis. Colonoscopy 08/19/21 showed a 7 cm infiltrative mass in the mid rectum (6-13cm from anal verge), pathology showed adenocarcinoma. MMR is normal -staging pelvic MRI 08/27/21 showed: staging at T4b; N2 likely associated with early extra mesorectal lymph node involvement; distance from tumor to internal anal sphincter is 1.2 cm; also with extramural venous involvement. -baseline CEA 571.84 on 09/06/21. -he received concurrent chemoRT with Xeloda 09/06/21-09/26/21 under the care of Dr. [name]. He noticed improvement in his symptoms with radiation.  -he underwent bronchscopy and biopsy of the RUL and RML masses on 09/27/21, which were consistent with metastatic rectal cancer.  -He began CAPOX on 10/14/21, taking Xeloda 1500 mg BID 2 weeks on/1 week off. He tolerated very well overall. CEA from that had showed a drop to 275.15. -he had port placed 10/24/21. -he returns today for cycle 2. I will slightly increase his oxali dose today to see how he tolerates. I spoke with his daughter today, who helps him manage his medications. He will restart the Xeloda today.   2. Symptom Management: Rectal pain, weight loss, fatigue -Secondary to rectal cancer  -he is on oxycodone and MS Contin, pain is controlled. -he is now taking 1 MS contin per day since 10/06/21 and rarely uses the oxycodone. -they connected with dietician on 10/14/21.   3. Mild  anemia from rectal bleeding, probable iron deficiency -He has started oral iron, tolerating well.  We will continue. -hgb overall stable, 11.8 today (11/03/21), will monitor   4. Pulmonary nodules/metastatic rectal adenocarcinoma -Seen on chest CT 08/26/21 showing 2.2 cm RML nodule and four RUL nodules/masses measuring up to 3.6 cm. Findings concerning for metastatic primary lung cancer. -He met with Dr. Valeta Harms who performed bronchoscopy. The RML and RUL lesions were positive for metastatic rectal adenocarcinoma.   -He denies any chest pain, shortness of breath, cough, or hemoptysis.    5. Multiple colon polyps -He has a large 3.5 cm polyps in the hepatic flexure, biopsy showed tubular adenoma.  Dr. [name] plan to repeat a colonoscopy in 6 months and remove it -He had additional 4 small polyps which were removed, path showed tubular adenomas.     PLAN:  -proceed with C2 oxaliplatin today, with slight dose increase to 121m/m2 due to good tolerance, will stay on this dose if his blood counts are adequate  -continue Xeloda 1500 mg q12hr, 14 days on/7 days off. Plan to start next cycle today. He has 20 tabs at home, I refilled #64 for him today. -cancel flush/lab appointments on 1/5 and 1/12 -he will pick up the MS contin refill -lab, flush, f/u, and CAPOX on 11/24/21 and 12/15/21 as scheduled   No problem-specific Assessment & Plan notes found for this encounter.   SUMMARY OF ONCOLOGIC HISTORY: Oncology History Overview Note  Cancer Staging Rectal cancer ([facility] Staging form: Colon and Rectum, AJCC 8th Edition - Clinical stage from 08/29/2021: cT4b, cN2, cM1 - Signed  by [name], MD on 08/29/2021    Rectal cancer [facility])  07/23/2021 Imaging   CT AP  IMPRESSION: 1. Appearance of the rectum likely indicates a rectal mass suspicious for rectal carcinoma. Inflammatory process would be less likely. Direct visualization is suggested. 2. Cholelithiasis without evidence of acute  cholecystitis. 3. Aortic atherosclerosis.   08/19/2021 Procedure   Colonoscopy, under Dr. [name]  Impression: - Rectal tenderness, palpable rectal mass and hemorrhoids found on digital rectal exam. - Stool in the entire examined colon - lavaged with still inadequate clearance. - One 35 mm polyp at the hepatic flexure. Biopsied. Tattooed distal in case future endoscopic resection is considered - however, has larger issues with rectal mass currently as below. - Four, 5 to 11 mm polyps in the transverse colon and at the hepatic flexure, removed with a cold snare. Resected and retrieved. - Diverticulosis in the recto-sigmoid colon and in the sigmoid colon. - Rule out malignancy, partially obstructing tumor in the rectum and from 6 to 13 cm proximal to the anus. Biopsied.   08/19/2021 Pathology Results   Diagnosis 1. Hepatic Flexure Biopsy - TUBULAR ADENOMA WITHOUT HIGH-GRADE DYSPLASIA OR MALIGNANCY 2. Transverse Colon Biopsy, and hepatic flexure, polyps (4) - TUBULAR ADENOMA WITHOUT HIGH-GRADE DYSPLASIA OR MALIGNANCY - OTHER FRAGMENTS OF POLYPOID COLONIC MUCOSA WITH NO SPECIFIC HISTOPATHOLOGIC CHANGES - FOOD MATERIAL 3. Rectum, biopsy - ADENOCARCINOMA. SEE NOTE   08/22/2021 Initial Diagnosis   Rectal cancer ([place])   08/26/2021 Imaging   IMPRESSION: 1. A 2.2 x 1.9 cm right middle lobe pulmonary nodule as well as a total of four right upper lobe pulmonary nodule and masses measuring up to 3.6 cm. Findings concerning for metastatic primary lung cancer versus less likely metastases in a patient with rectal cancer. Additional imaging evaluation or consultation with Pulmonology or Thoracic Surgery recommended. 2. No gross hilar adenopathy, noting limited sensitivity for the detection of hilar adenopathy on this noncontrast study. 3. Cholelithiasis. 4.  Emphysema (ICD10-J43.9). 5. At least left anterior descending coronary artery calcifications.   08/27/2021 Imaging    IMPRESSION: Rectal adenocarcinoma T stage: T4 B   Rectal adenocarcinoma N stage: N2 disease likely associated with early extra mesorectal lymph node involvement.   Distance from tumor to the internal anal sphincter is 1.2 cm.   Also with extramural venous involvement as described.   08/29/2021 Cancer Staging   Staging form: Colon and Rectum, AJCC 8th Edition - Clinical stage from 08/29/2021: [name], cM1 - Signed by [name], MD on 08/29/2021 Stage prefix: Initial diagnosis    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[place])  10/06/2021 Initial Diagnosis   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[place])   10/14/2021 -  Chemotherapy   Patient is on Treatment Plan : COLORECTAL CapeOx + Bevacizumab q21d        INTERVAL HISTORY:  Jorge [name] is here for a follow up of rectal cancer. He was last seen by me on 10/21/21. He presents to the clinic alone. He notes his grandson dropped him off today. We connected with his daughter [name] on the phone. He reports he is feeling well overall. He reports his pain is improving, and he now only required one morphine a day. He notes he dropped his oxycodone recently, so he is unsure how many he has left. However, he reports he is not using it often.   All other systems were reviewed with the patient and are negative.  MEDICAL HISTORY:  Past Medical History:  Diagnosis Date  Anemia    Rectal adenocarcinoma metastatic to intrapelvic lymph node ([place]) 08/19/2021    SURGICAL HISTORY: Past Surgical History:  Procedure Laterality Date   BRONCHIAL BIOPSY  09/27/2021   Procedure: BRONCHIAL BIOPSIES;  Surgeon: Garner Nash, DO;  Location: [facility];  Service: Pulmonary;;   BRONCHIAL BRUSHINGS  09/27/2021   Procedure: BRONCHIAL BRUSHINGS;  Surgeon: Garner Nash, DO;  Location: [facility];  Service: Pulmonary;;   BRONCHIAL NEEDLE ASPIRATION BIOPSY  09/27/2021   Procedure: BRONCHIAL NEEDLE ASPIRATION BIOPSIES;  Surgeon: Garner Nash, DO;  Location: [facility];  Service: Pulmonary;;   COLONOSCOPY     IR IMAGING GUIDED PORT INSERTION  10/24/2021   left breast surgery Left 1968   VIDEO BRONCHOSCOPY WITH RADIAL ENDOBRONCHIAL ULTRASOUND  09/27/2021   Procedure: RADIAL ENDOBRONCHIAL ULTRASOUND;  Surgeon: Garner Nash, DO;  Location: [facility];  Service: Pulmonary;;    I have reviewed the social history and family history with the patient and they are unchanged from previous note.  ALLERGIES:  has No Known Allergies.  MEDICATIONS:  Current Outpatient Medications  Medication Sig Dispense Refill   capecitabine (XELODA) 500 MG tablet Take 3 tablets (1,500 mg total) by mouth 2 (two) times daily after a meal. Take on day 1-14 every 21 days, pt has 20 tabs at home from previous cycle 64 tablet 0   carbonyl iron (FEOSOL) 45 MG TABS tablet Take 45 mg by mouth daily.     dexamethasone (DECADRON) 4 MG tablet Take 2 tablets (8 mg total) by mouth daily. Start the day after chemotherapy for 2 days. Take with food. 30 tablet 1   lidocaine-prilocaine (EMLA) cream Apply 1 application topically as needed. 30 g 2   lidocaine-prilocaine (EMLA) cream Apply to affected area once 30 g 3   loperamide (IMODIUM A-D) 2 MG tablet Take 4 mg by mouth 4 (four) times daily as needed for diarrhea or loose stools.     LORazepam (ATIVAN) 0.5 MG tablet Take 1 tablet (0.5 mg total) by mouth every 6 (six) hours as needed (Nausea or vomiting). 30 tablet 0   morphine (MS CONTIN) 15 MG 12 hr tablet Take 1 tablet (15 mg total) by mouth every 12 (twelve) hours. 60 tablet 0   Multiple Vitamin (MULTIVITAMIN WITH MINERALS) TABS tablet Take 1 tablet by mouth daily.     ondansetron (ZOFRAN) 8 MG tablet Take 1 tablet (8 mg total) by mouth every 8 (eight) hours as needed for nausea or vomiting. 30 tablet 0   ondansetron (ZOFRAN) 8 MG tablet Take 1 tablet (8 mg total) by mouth 2 (two) times daily as needed for refractory nausea / vomiting. Start on day 3  after chemotherapy. 30 tablet 1   Oxycodone HCl 10 MG TABS Take 1 tablet (10 mg total) by mouth every 8 (eight) hours as needed. 60 tablet 0   prochlorperazine (COMPAZINE) 10 MG tablet Take 1 tablet (10 mg total) by mouth every 6 (six) hours as needed for nausea or vomiting. 30 tablet 0   prochlorperazine (COMPAZINE) 10 MG tablet Take 1 tablet (10 mg total) by mouth every 6 (six) hours as needed (Nausea or vomiting). 30 tablet 1   No current facility-administered medications for this visit.    PHYSICAL EXAMINATION: ECOG PERFORMANCE STATUS: 1 - Symptomatic but completely ambulatory  Vitals:   11/03/21 1201  BP: (!) 150/79  Pulse: 73  Resp: 18  Temp: 98.9 F (37.2 C)  SpO2: 100%   Wt Readings from Last 3  Encounters:  11/03/21 137 lb 1.6 oz (62.2 kg)  10/14/21 136 lb 12 oz (62 kg)  10/06/21 137 lb 14.4 oz (62.6 kg)     GENERAL:alert, no distress and comfortable SKIN: skin color normal, no rashes or significant lesions EYES: normal, Conjunctiva are pink and non-injected, sclera clear  NEURO: alert & oriented x 3 with fluent speech  LABORATORY DATA:  I have reviewed the data as listed CBC Latest Ref Rng & Units 11/03/2021 10/14/2021 09/23/2021  WBC 4.0 - 10.5 K/uL 3.0(L) 3.5(L) 4.2  Hemoglobin 13.0 - 17.0 g/dL 11.8(L) 12.7(L) 12.8(L)  Hematocrit 39.0 - 52.0 % 36.8(L) 40.4 40.5  Platelets 150 - 400 K/uL 151 258 286     CMP Latest Ref Rng & Units 11/03/2021 10/14/2021 09/23/2021  Glucose 70 - 99 mg/dL 84 65(L) 96  BUN 8 - 23 mg/dL '8 11 14  ' Creatinine 0.61 - 1.24 mg/dL 0.82 0.95 0.89  Sodium 135 - 145 mmol/L 136 138 136  Potassium 3.5 - 5.1 mmol/L 4.2 3.9 3.9  Chloride 98 - 111 mmol/L 104 103 100  CO2 22 - 32 mmol/L '29 27 27  ' Calcium 8.9 - 10.3 mg/dL 8.8(L) 8.8(L) 9.0  Total Protein 6.5 - 8.1 g/dL 6.9 7.2 8.2(H)  Total Bilirubin 0.3 - 1.2 mg/dL 0.7 0.3 0.3  Alkaline Phos 38 - 126 U/L 100 95 89  AST 15 - 41 U/L 438(HH) 61(H) 31  ALT 0 - 44 U/L 357(HH) 64(H) 26       RADIOGRAPHIC STUDIES: I have personally reviewed the radiological images as listed and agreed with the findings in the report. No results found.    No orders of the defined types were placed in this encounter.  All questions were answered. The patient knows to call the clinic with any problems, questions or concerns. No barriers to learning was detected. The total time spent in the appointment was 30 minutes.     [name], MD 11/03/2021   I, Wilburn Mylar, am acting as scribe for [name], MD.   I have reviewed the above documentation for accuracy and completeness, and I agree with the above.

## 2021-11-03 NOTE — Patient Instructions (Signed)
[facility]   Discharge Instructions: Thank you for choosing [facility] to provide your oncology and hematology care.   If you have a lab appointment with the [facility], please go directly to the [facility] and check in at the registration area.   Wear comfortable clothing and clothing appropriate for easy access to any Portacath or PICC line.   We strive to give you quality time with your provider. You may need to reschedule your appointment if you arrive late (15 or more minutes).  Arriving late affects you and other patients whose appointments are after yours.  Also, if you miss three or more appointments without notifying the office, you may be dismissed from the clinic at the providers discretion.      For prescription refill requests, have your pharmacy contact our office and allow 72 hours for refills to be completed.    Today, your treatment was held due to elevated liver enzymes. [name] will follow-up with you in one week's time. Please hold your Xeloda at this time until you are seen again.  If you have any questions or concerns, please contact our office should you have any questions or concerns.   To help prevent nausea and vomiting after your treatment, we encourage you to take your nausea medication as directed.  BELOW ARE SYMPTOMS THAT SHOULD BE REPORTED IMMEDIATELY: *FEVER GREATER THAN 100.4 F (38 C) OR HIGHER *CHILLS OR SWEATING *NAUSEA AND VOMITING THAT IS NOT CONTROLLED WITH YOUR NAUSEA MEDICATION *UNUSUAL SHORTNESS OF BREATH *UNUSUAL BRUISING OR BLEEDING *URINARY PROBLEMS (pain or burning when urinating, or frequent urination) *BOWEL PROBLEMS (unusual diarrhea, constipation, pain near the anus) TENDERNESS IN MOUTH AND THROAT WITH OR WITHOUT PRESENCE OF ULCERS (sore throat, sores in mouth, or a toothache) UNUSUAL RASH, SWELLING OR PAIN  UNUSUAL VAGINAL DISCHARGE OR ITCHING   Items with * indicate a potential  emergency and should be followed up as soon as possible or go to the Emergency Department if any problems should occur.  Please show the CHEMOTHERAPY ALERT CARD or IMMUNOTHERAPY ALERT CARD at check-in to the Emergency Department and triage nurse.  Should you have questions after your visit or need to cancel or reschedule your appointment, please contact [facility]  Dept: [phone]  and follow the prompts.  Office hours are 8:00 a.m. to 4:30 p.m. Monday - Friday. Please note that voicemails left after 4:00 p.m. may not be returned until the following business day.  We are closed weekends and major holidays. You have access to a nurse at all times for urgent questions. Please call the main number to the clinic Dept: [phone] and follow the prompts.   For any non-urgent questions, you may also contact your provider using MyChart. We now offer e-Visits for anyone 60 and older to request care online for non-urgent symptoms. For details visit [email or web address].   Also download the MyChart app! Go to the app store, search "MyChart", open the app, select [facility], and log in with your MyChart username and password.  Due to Covid, a mask is required upon entering the hospital/clinic. If you do not have a mask, one will be given to you upon arrival. For doctor visits, patients may have 1 support person aged 36 or older with them. For treatment visits, patients cannot have anyone with them due to current Covid guidelines and our immunocompromised population.

## 2021-11-03 NOTE — Progress Notes (Signed)
Per [name], holding treatment this week d/t elevated LFTs.  Follow up with tx in 1wk per [name].

## 2021-11-03 NOTE — Progress Notes (Signed)
Spoke with pt's daughter via telephone regarding pt's chemotherapy tx being held today d/t elevated LFTs.  Instructed pt's daughter that [name] would like for the pt to NOT take the Capecitabine PO as well.  Informed pt's daughter that [name] would like to hold treatment for 1wk and the pt to return on Thursday 11/10/2021 to resume tx.  Pt's daughter verbalized understanding of instructions and stated she would put the pt's Capecitabine medication up so her dad will be sure NOT to take it until further advised by [name].  [name] is currently checking with Infusion to see if they can accommodate the pt next Thursday.  Deferred tx for 1wk in Epic.  Will have [name] resign orders for C2D1 in [place].

## 2021-11-03 NOTE — Telephone Encounter (Signed)
CRITICAL VALUE STICKER  CRITICAL VALUE: ALT = 357 and AST = 438  RECEIVER (on-site recipient of call): Yetta Glassman, [age][place][age] NOTIFIED: 11/03/21 at 12:37pm  MESSENGER (representative from lab): Pam  MD NOTIFIED: Burr Medico  TIME OF NOTIFICATION: 11/03/21 at 12:40  RESPONSE: Notification given to Tammi Sou, RN for review and follow-up with pt and provider.

## 2021-11-04 ENCOUNTER — Other Ambulatory Visit (HOSPITAL_COMMUNITY): Payer: Self-pay

## 2021-11-08 ENCOUNTER — Telehealth: Payer: Self-pay

## 2021-11-08 NOTE — Telephone Encounter (Signed)
Appt made for 12/13/21 at 1030 am to see Dr [name] to discuss colon polyp removal.  Pt and caregiver aware.

## 2021-11-08 NOTE — Telephone Encounter (Signed)
-----   Message from Wojtowicz Copas., MD sent at 11/04/2021  2:15 AM EST ----- Regarding: Followup Ernesto Zukowski,, Set a followup in clinic to discuss consideration of colon polyp removal. Can be in next couple of months and soak to use a held slot. Thanks. GM

## 2021-11-10 ENCOUNTER — Other Ambulatory Visit: Payer: Medicare Other

## 2021-11-11 ENCOUNTER — Encounter (HOSPITAL_COMMUNITY): Payer: Self-pay

## 2021-11-15 ENCOUNTER — Other Ambulatory Visit: Payer: Self-pay | Admitting: Nurse Practitioner

## 2021-11-15 ENCOUNTER — Other Ambulatory Visit: Payer: Self-pay | Admitting: Hematology

## 2021-11-15 MED ORDER — OXYCODONE HCL 10 MG PO TABS: 10.0000 mg | ORAL_TABLET | Freq: Three times a day (TID) | ORAL | 0 refills | Status: DC | PRN

## 2021-11-16 ENCOUNTER — Other Ambulatory Visit (HOSPITAL_COMMUNITY): Payer: Self-pay

## 2021-11-17 ENCOUNTER — Other Ambulatory Visit: Payer: Medicare Other

## 2021-11-17 ENCOUNTER — Encounter: Payer: Self-pay | Admitting: Hematology

## 2021-11-18 ENCOUNTER — Other Ambulatory Visit (HOSPITAL_COMMUNITY): Payer: Self-pay

## 2021-11-24 ENCOUNTER — Inpatient Hospital Stay: Payer: Medicare Other

## 2021-11-24 ENCOUNTER — Inpatient Hospital Stay (HOSPITAL_BASED_OUTPATIENT_CLINIC_OR_DEPARTMENT_OTHER): Payer: Medicare Other | Admitting: Hematology

## 2021-11-24 ENCOUNTER — Other Ambulatory Visit (HOSPITAL_COMMUNITY): Payer: Self-pay

## 2021-11-24 ENCOUNTER — Inpatient Hospital Stay: Payer: Medicare Other | Attending: Hematology | Admitting: Nutrition

## 2021-11-24 ENCOUNTER — Other Ambulatory Visit: Payer: Self-pay

## 2021-11-24 VITALS — BP 136/98 | HR 70 | Temp 98.9°F | Resp 18 | Ht 71.0 in | Wt 140.7 lb

## 2021-11-24 DIAGNOSIS — Z95828 Presence of other vascular implants and grafts: Secondary | ICD-10-CM

## 2021-11-24 DIAGNOSIS — Z5111 Encounter for antineoplastic chemotherapy: Secondary | ICD-10-CM | POA: Diagnosis present

## 2021-11-24 DIAGNOSIS — C7800 Secondary malignant neoplasm of unspecified lung: Secondary | ICD-10-CM

## 2021-11-24 DIAGNOSIS — C20 Malignant neoplasm of rectum: Secondary | ICD-10-CM | POA: Diagnosis present

## 2021-11-24 DIAGNOSIS — C775 Secondary and unspecified malignant neoplasm of intrapelvic lymph nodes: Secondary | ICD-10-CM | POA: Diagnosis not present

## 2021-11-24 DIAGNOSIS — Z5112 Encounter for antineoplastic immunotherapy: Secondary | ICD-10-CM | POA: Diagnosis present

## 2021-11-24 DIAGNOSIS — Z79899 Other long term (current) drug therapy: Secondary | ICD-10-CM | POA: Insufficient documentation

## 2021-11-24 LAB — CBC WITH DIFFERENTIAL (CANCER CENTER ONLY)
Abs Immature Granulocytes: 0.01 10*3/uL (ref 0.00–0.07)
Basophils Absolute: 0 10*3/uL (ref 0.0–0.1)
Basophils Relative: 1 %
Eosinophils Absolute: 0.1 10*3/uL (ref 0.0–0.5)
Eosinophils Relative: 2 %
HCT: 38.2 % — ABNORMAL LOW (ref 39.0–52.0)
Hemoglobin: 12.4 g/dL — ABNORMAL LOW (ref 13.0–17.0)
Immature Granulocytes: 0 %
Lymphocytes Relative: 29 %
Lymphs Abs: 1 10*3/uL (ref 0.7–4.0)
MCH: 28.2 pg (ref 26.0–34.0)
MCHC: 32.5 g/dL (ref 30.0–36.0)
MCV: 86.8 fL (ref 80.0–100.0)
Monocytes Absolute: 0.7 10*3/uL (ref 0.1–1.0)
Monocytes Relative: 21 %
Neutro Abs: 1.6 10*3/uL — ABNORMAL LOW (ref 1.7–7.7)
Neutrophils Relative %: 47 %
Platelet Count: 201 10*3/uL (ref 150–400)
RBC: 4.4 MIL/uL (ref 4.22–5.81)
RDW: 18.3 % — ABNORMAL HIGH (ref 11.5–15.5)
WBC Count: 3.4 10*3/uL — ABNORMAL LOW (ref 4.0–10.5)
nRBC: 0 % (ref 0.0–0.2)

## 2021-11-24 LAB — CMP (CANCER CENTER ONLY)
ALT: 58 U/L — ABNORMAL HIGH (ref 0–44)
AST: 56 U/L — ABNORMAL HIGH (ref 15–41)
Albumin: 3.1 g/dL — ABNORMAL LOW (ref 3.5–5.0)
Alkaline Phosphatase: 113 U/L (ref 38–126)
BUN: 10 mg/dL (ref 8–23)
CO2: 29 mmol/L (ref 22–32)
Calcium: 9 mg/dL (ref 8.9–10.3)
Chloride: 104 mmol/L (ref 98–111)
Creatinine: 0.73 mg/dL (ref 0.61–1.24)
GFR, Estimated: >60 mL/min (ref >60)
Glucose, Bld: 97 mg/dL (ref 70–99)
Potassium: 4.3 mmol/L (ref 3.5–5.1)
Sodium: 135 mmol/L (ref 135–145)
Total Bilirubin: 0.5 mg/dL (ref 0.3–1.2)
Total Protein: 7.8 g/dL (ref 6.5–8.1)

## 2021-11-24 LAB — TOTAL PROTEIN, URINE DIPSTICK: Protein, ur: NEGATIVE mg/dL

## 2021-11-24 LAB — CEA (IN HOUSE-CHCC): CEA (CHCC-In House): 133.3 ng/mL — ABNORMAL HIGH (ref 0.00–5.00)

## 2021-11-24 MED ORDER — SODIUM CHLORIDE 0.9 % IV SOLN: Freq: Once | INTRAVENOUS | Status: AC

## 2021-11-24 MED ORDER — CAPECITABINE 500 MG PO TABS
ORAL_TABLET | ORAL | 1 refills | Status: DC
  Filled 2021-11-24: qty 70, fill #0
  Filled 2021-12-07: qty 70, 14d supply, fill #0
  Filled 2022-01-04: qty 70, 14d supply, fill #1

## 2021-11-24 MED ORDER — OXALIPLATIN CHEMO INJECTION 100 MG/20ML
120.0000 mg/m2 | Freq: Once | INTRAVENOUS | Status: AC
  Administered 2021-11-24: 210 mg via INTRAVENOUS
  Filled 2021-11-24: qty 40

## 2021-11-24 MED ORDER — SODIUM CHLORIDE 0.9 % IV SOLN
10.0000 mg | Freq: Once | INTRAVENOUS | Status: AC
  Administered 2021-11-24: 10 mg via INTRAVENOUS
  Filled 2021-11-24: qty 10

## 2021-11-24 MED ORDER — DEXTROSE 5 % IV SOLN: Freq: Once | INTRAVENOUS | Status: AC

## 2021-11-24 MED ORDER — PALONOSETRON HCL INJECTION 0.25 MG/5ML
0.2500 mg | Freq: Once | INTRAVENOUS | Status: AC
  Administered 2021-11-24: 0.25 mg via INTRAVENOUS
  Filled 2021-11-24: qty 5

## 2021-11-24 MED ORDER — SODIUM CHLORIDE 0.9% FLUSH
10.0000 mL | INTRAVENOUS | Status: DC | PRN
  Administered 2021-11-24: 10 mL

## 2021-11-24 MED ORDER — SODIUM CHLORIDE 0.9% FLUSH
10.0000 mL | Freq: Once | INTRAVENOUS | Status: AC
  Administered 2021-11-24: 10 mL

## 2021-11-24 MED ORDER — SODIUM CHLORIDE 0.9 % IV SOLN
7.5000 mg/kg | Freq: Once | INTRAVENOUS | Status: AC
  Administered 2021-11-24: 500 mg via INTRAVENOUS
  Filled 2021-11-24: qty 4

## 2021-11-24 MED ORDER — HEPARIN SOD (PORK) LOCK FLUSH 100 UNIT/ML IV SOLN
500.0000 [IU] | Freq: Once | INTRAVENOUS | Status: AC | PRN
  Administered 2021-11-24: 500 [IU]

## 2021-11-24 MED ORDER — MORPHINE SULFATE ER 15 MG PO TBCR: 15.0000 mg | EXTENDED_RELEASE_TABLET | Freq: Two times a day (BID) | ORAL | 0 refills | Status: DC

## 2021-11-24 NOTE — Progress Notes (Signed)
Nutrition follow-up completed with patient during infusion for metastatic rectal cancer.  Weight improved and documented as 140.7 pounds January 19 from 136 pounds December 9. Labs reviewed.  Patient reports his appetite is very good and he is eating frequently. He drinks 2 oral nutrition supplements/shakes daily.  Reports his daughter will purchase oral nutrition supplements for him.  He declines coupons today. Patient denies nutrition questions or needs at this time.  Nutrition diagnosis: Food and nutrition related knowledge deficit resolved.  I have encouraged patient to continue strategies for adequate calorie and protein intake and weight maintenance.  Recommended consult RD for further nutrition needs.

## 2021-11-24 NOTE — Progress Notes (Signed)
Jorge [name]   [phone] Fax:([phone]) [phone]   Clinic Follow up Note   Patient Care Team: Pcp, No as PCP - General  Date of Service:  11/24/2021  CHIEF COMPLAINT: f/u of rectal cancer  CURRENT THERAPY:  First line chemo CAPOX every 3 weeks, started on 10/14/21  ASSESSMENT & PLAN:  Jorge [name] is a 71 y.o. male with   1. 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 CT AP 07/23/21 showed rectal mass with no significant adenopathy or distant metastasis. Colonoscopy 08/19/21 showed a 7 cm infiltrative mass in the mid rectum (6-13cm from anal verge), pathology showed adenocarcinoma. MMR is normal -staging pelvic MRI 08/27/21 showed: staging at T4b; N2 likely associated with early extra mesorectal lymph node involvement; distance from tumor to internal anal sphincter is 1.2 cm; also with extramural venous involvement. -baseline CEA 571.84 on 09/06/21. -he received concurrent chemoRT with Xeloda 11/1-11/21/22 under the care of Dr. [name]. He noticed improvement in his symptoms with radiation. CEA from that had showed a drop to 275.15 on 10/14/21. -he underwent bronchscopy and biopsy of the RUL and RML masses on 09/27/21, which were consistent with metastatic rectal cancer.  -He began CAPOX on 10/14/21, taking Xeloda 1500 mg BID 2 weeks on/1 week off. He tolerated very well overall. CEA dropped again to 133.3 today (11/24/21) -he had port placed 10/24/21. -we held his second cycle CAPOX due to elevated liver enzymes. His levels have decreased significantly (AST- 56, ALT- 58). Will proceed cycle 2 today with reduced Xeloda dose to 2 tabs in am, 3 tabs in pm for 14 days  -f/u in 3 weeks    2. Symptom Management: Rectal pain, weight loss, fatigue -Secondary to rectal cancer  -improving, he is now taking 1 MS contin per day since 10/06/21 and rarely uses the oxycodone. -we discussed trying to  wean off the MS contin, he wants to try. -they connected with dietician on 10/14/21.   3. Mild anemia from rectal bleeding, probable iron deficiency -He has started oral iron, tolerating well.  We will continue. -hgb overall stable, 12.4 today (11/24/21), will monitor   4. Pulmonary nodules/metastatic rectal adenocarcinoma -Seen on chest CT 08/26/21 showing 2.2 cm RML nodule and four RUL nodules/masses measuring up to 3.6 cm. Findings concerning for metastatic primary lung cancer. -He met with Dr. Valeta Harms who performed bronchoscopy. The RML and RUL lesions were positive for metastatic rectal adenocarcinoma.   -He denies any chest pain, shortness of breath, cough, or hemoptysis.    5. Multiple colon polyps -He has a large 3.5 cm polyps in the hepatic flexure, biopsy showed tubular adenoma. He is scheduled for f/u with Dr. [name] on 12/13/21. -He had additional 4 small polyps which were removed, path showed tubular adenomas.     PLAN:  -proceed with C2 oxaliplatin today at same dose  -restart Xeloda, reduce to 102m am and 1500 mg pm q12hr, 14 days on/7 days off, today -cancel flush/lab appointments on 1/26 -I will refill MS contin, he will try to wean it off  -lab, flush, f/u, and CAPOX on 12/15/21 as scheduled -I called his daughter to update her    No problem-specific Assessment & Plan notes found for this encounter.   SUMMARY OF ONCOLOGIC HISTORY: Oncology History Overview Note  Cancer Staging Rectal cancer ([facility] Staging form: Colon and Rectum, AJCC 8th Edition - Clinical stage from 08/29/2021: cKirtland Bouchard cM1 - Signed by FBurr Medico  Krista Blue, MD on 08/29/2021    Rectal cancer ([place])  07/23/2021 Imaging   CT AP  IMPRESSION: 1. Appearance of the rectum likely indicates a rectal mass suspicious for rectal carcinoma. Inflammatory process would be less likely. Direct visualization is suggested. 2. Cholelithiasis without evidence of acute cholecystitis. 3. Aortic atherosclerosis.    08/19/2021 Procedure   Colonoscopy, under Dr. [name]  Impression: - Rectal tenderness, palpable rectal mass and hemorrhoids found on digital rectal exam. - Stool in the entire examined colon - lavaged with still inadequate clearance. - One 35 mm polyp at the hepatic flexure. Biopsied. Tattooed distal in case future endoscopic resection is considered - however, has larger issues with rectal mass currently as below. - Four, 5 to 11 mm polyps in the transverse colon and at the hepatic flexure, removed with a cold snare. Resected and retrieved. - Diverticulosis in the recto-sigmoid colon and in the sigmoid colon. - Rule out malignancy, partially obstructing tumor in the rectum and from 6 to 13 cm proximal to the anus. Biopsied.   08/19/2021 Pathology Results   Diagnosis 1. Hepatic Flexure Biopsy - TUBULAR ADENOMA WITHOUT HIGH-GRADE DYSPLASIA OR MALIGNANCY 2. Transverse Colon Biopsy, and hepatic flexure, polyps (4) - TUBULAR ADENOMA WITHOUT HIGH-GRADE DYSPLASIA OR MALIGNANCY - OTHER FRAGMENTS OF POLYPOID COLONIC MUCOSA WITH NO SPECIFIC HISTOPATHOLOGIC CHANGES - FOOD MATERIAL 3. Rectum, biopsy - ADENOCARCINOMA. SEE NOTE   08/22/2021 Initial Diagnosis   Rectal cancer ([place])   08/26/2021 Imaging   IMPRESSION: 1. A 2.2 x 1.9 cm right middle lobe pulmonary nodule as well as a total of four right upper lobe pulmonary nodule and masses measuring up to 3.6 cm. Findings concerning for metastatic primary lung cancer versus less likely metastases in a patient with rectal cancer. Additional imaging evaluation or consultation with Pulmonology or Thoracic Surgery recommended. 2. No gross hilar adenopathy, noting limited sensitivity for the detection of hilar adenopathy on this noncontrast study. 3. Cholelithiasis. 4.  Emphysema (ICD10-J43.9). 5. At least left anterior descending coronary artery calcifications.   08/27/2021 Imaging   IMPRESSION: Rectal adenocarcinoma T stage: T4 B   Rectal  adenocarcinoma N stage: N2 disease likely associated with early extra mesorectal lymph node involvement.   Distance from tumor to the internal anal sphincter is 1.2 cm.   Also with extramural venous involvement as described.   08/29/2021 Cancer Staging   Staging form: Colon and Rectum, AJCC 8th Edition - Clinical stage from 08/29/2021: [name], cM1 - Signed by [name], MD on 08/29/2021 Stage prefix: Initial diagnosis    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[place])  10/06/2021 Initial Diagnosis   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[place])   10/14/2021 -  Chemotherapy   Patient is on Treatment Plan : COLORECTAL CapeOx + Bevacizumab q21d        INTERVAL HISTORY:  Jorge [name] is here for a follow up of rectal cancer. He was last seen by me on 11/03/21. He presents to the clinic alone. He reports he drinks plenty of water, and his urine is not dark in color. He reports he is taking morphine 1-2 times a day and has not needed the oxycodone recently.   All other systems were reviewed with the patient and are negative.  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 ([place]) 08/19/2021    SURGICAL HISTORY: Past Surgical History:  Procedure Laterality Date   BRONCHIAL BIOPSY  09/27/2021   Procedure:  BRONCHIAL BIOPSIES;  Surgeon: Garner Nash, DO;  Location: [facility];  Service: Pulmonary;;   BRONCHIAL BRUSHINGS  09/27/2021   Procedure: BRONCHIAL BRUSHINGS;  Surgeon: Garner Nash, DO;  Location: [facility];  Service: Pulmonary;;   BRONCHIAL NEEDLE ASPIRATION BIOPSY  09/27/2021   Procedure: BRONCHIAL NEEDLE ASPIRATION BIOPSIES;  Surgeon: Garner Nash, DO;  Location: [facility];  Service: Pulmonary;;   COLONOSCOPY     IR IMAGING GUIDED PORT INSERTION  10/24/2021   left breast surgery Left 1968   VIDEO BRONCHOSCOPY WITH RADIAL ENDOBRONCHIAL ULTRASOUND  09/27/2021    Procedure: RADIAL ENDOBRONCHIAL ULTRASOUND;  Surgeon: Garner Nash, DO;  Location: [facility];  Service: Pulmonary;;    I have reviewed the social history and family history with the patient and they are unchanged from previous note.  ALLERGIES:  has No Known Allergies.  MEDICATIONS:  Current Outpatient Medications  Medication Sig Dispense Refill   capecitabine (XELODA) 500 MG tablet Take 2 tabs in morning and 3 tabs in evening after meals.Take on day 1-14 every 21 days. 70 tablet 1   carbonyl iron (FEOSOL) 45 MG TABS tablet Take 45 mg by mouth daily.     dexamethasone (DECADRON) 4 MG tablet Take 2 tablets (8 mg total) by mouth daily. Start the day after chemotherapy for 2 days. Take with food. 30 tablet 1   lidocaine-prilocaine (EMLA) cream Apply 1 application topically as needed. 30 g 2   lidocaine-prilocaine (EMLA) cream Apply to affected area once 30 g 3   loperamide (IMODIUM A-D) 2 MG tablet Take 4 mg by mouth 4 (four) times daily as needed for diarrhea or loose stools.     LORazepam (ATIVAN) 0.5 MG tablet Take 1 tablet (0.5 mg total) by mouth every 6 (six) hours as needed (Nausea or vomiting). 30 tablet 0   morphine (MS CONTIN) 15 MG 12 hr tablet Take 1 tablet (15 mg total) by mouth every 12 (twelve) hours. 60 tablet 0   Multiple Vitamin (MULTIVITAMIN WITH MINERALS) TABS tablet Take 1 tablet by mouth daily.     ondansetron (ZOFRAN) 8 MG tablet Take 1 tablet (8 mg total) by mouth every 8 (eight) hours as needed for nausea or vomiting. 30 tablet 0   ondansetron (ZOFRAN) 8 MG tablet Take 1 tablet (8 mg total) by mouth 2 (two) times daily as needed for refractory nausea / vomiting. Start on day 3 after chemotherapy. 30 tablet 1   Oxycodone HCl 10 MG TABS Take 1 tablet (10 mg total) by mouth every 8 (eight) hours as needed. 60 tablet 0   prochlorperazine (COMPAZINE) 10 MG tablet Take 1 tablet (10 mg total) by mouth every 6 (six) hours as needed for nausea or vomiting. 30 tablet 0    prochlorperazine (COMPAZINE) 10 MG tablet Take 1 tablet (10 mg total) by mouth every 6 (six) hours as needed (Nausea or vomiting). 30 tablet 1   No current facility-administered medications for this visit.   Facility-Administered Medications Ordered in Other Visits  Medication Dose Route Frequency Provider Last Rate Last Admin   heparin lock flush 100 unit/mL  500 Units Intracatheter Once PRN [name], MD       oxaliplatin (ELOXATIN) 210 mg in dextrose 5 % 500 mL chemo infusion  120 mg/m2 (Treatment Plan Recorded) Intravenous Once [name], MD 271 mL/hr at 11/24/21 1330 210 mg at 11/24/21 1330   sodium chloride flush (NS) 0.9 % injection 10 mL  10 mL Intracatheter PRN [name], MD  PHYSICAL EXAMINATION: ECOG PERFORMANCE STATUS: 1 - Symptomatic but completely ambulatory  Vitals:   11/24/21 1017  BP: (!) 136/98  Pulse: 70  Resp: 18  Temp: 98.9 F (37.2 C)  SpO2: 100%   Wt Readings from Last 3 Encounters:  11/24/21 140 lb 11.2 oz (63.8 kg)  11/03/21 137 lb 1.6 oz (62.2 kg)  10/14/21 136 lb 12 oz (62 kg)     GENERAL:alert, no distress and comfortable SKIN: skin color normal, no rashes or significant lesions EYES: normal, Conjunctiva are pink and non-injected, sclera clear  NEURO: alert & oriented x 3 with fluent speech  LABORATORY DATA:  I have reviewed the data as listed CBC Latest Ref Rng & Units 11/24/2021 11/03/2021 10/14/2021  WBC 4.0 - 10.5 K/uL 3.4(L) 3.0(L) 3.5(L)  Hemoglobin 13.0 - 17.0 g/dL 12.4(L) 11.8(L) 12.7(L)  Hematocrit 39.0 - 52.0 % 38.2(L) 36.8(L) 40.4  Platelets 150 - 400 K/uL 201 151 258     CMP Latest Ref Rng & Units 11/24/2021 11/03/2021 10/14/2021  Glucose 70 - 99 mg/dL 97 84 65(L)  BUN 8 - 23 mg/dL '10 8 11  ' Creatinine 0.61 - 1.24 mg/dL 0.73 0.82 0.95  Sodium 135 - 145 mmol/L 135 136 138  Potassium 3.5 - 5.1 mmol/L 4.3 4.2 3.9  Chloride 98 - 111 mmol/L 104 104 103  CO2 22 - 32 mmol/L '29 29 27  ' Calcium 8.9 - 10.3 mg/dL 9.0 8.8(L) 8.8(L)   Total Protein 6.5 - 8.1 g/dL 7.8 6.9 7.2  Total Bilirubin 0.3 - 1.2 mg/dL 0.5 0.7 0.3  Alkaline Phos 38 - 126 U/L 113 100 95  AST 15 - 41 U/L 56(H) 438(HH) 61(H)  ALT 0 - 44 U/L 58(H) 357(HH) 64(H)      RADIOGRAPHIC STUDIES: I have personally reviewed the radiological images as listed and agreed with the findings in the report. No results found.    No orders of the defined types were placed in this encounter.  All questions were answered. The patient knows to call the clinic with any problems, questions or concerns. No barriers to learning was detected. The total time spent in the appointment was 30 minutes.     [name], MD 11/24/2021   I, Wilburn Mylar, am acting as scribe for [name], MD.   I have reviewed the above documentation for accuracy and completeness, and I agree with the above.

## 2021-11-24 NOTE — Patient Instructions (Signed)
[facility] ONCOL[facility]  Discharge Instructions: Thank you for choosing [facility] to provide your oncology and hematology care.   If you have a lab appointment with the [facility], please go directly to the [facility] and check in at the registration area.   Wear comfortable clothing and clothing appropriate for easy access to any Portacath or PICC line.   We strive to give you quality time with your provider. You may need to reschedule your appointment if you arrive late (15 or more minutes).  Arriving late affects you and other patients whose appointments are after yours.  Also, if you miss three or more appointments without notifying the office, you may be dismissed from the clinic at the providers discretion.      For prescription refill requests, have your pharmacy contact our office and allow 72 hours for refills to be completed.    Today you received the following chemotherapy and/or immunotherapy agents: Bevacizumab, Oxaliplatin.      To help prevent nausea and vomiting after your treatment, we encourage you to take your nausea medication as directed.  BELOW ARE SYMPTOMS THAT SHOULD BE REPORTED IMMEDIATELY: *FEVER GREATER THAN 100.4 F (38 C) OR HIGHER *CHILLS OR SWEATING *NAUSEA AND VOMITING THAT IS NOT CONTROLLED WITH YOUR NAUSEA MEDICATION *UNUSUAL SHORTNESS OF BREATH *UNUSUAL BRUISING OR BLEEDING *URINARY PROBLEMS (pain or burning when urinating, or frequent urination) *BOWEL PROBLEMS (unusual diarrhea, constipation, pain near the anus) TENDERNESS IN MOUTH AND THROAT WITH OR WITHOUT PRESENCE OF ULCERS (sore throat, sores in mouth, or a toothache) UNUSUAL RASH, SWELLING OR PAIN  UNUSUAL VAGINAL DISCHARGE OR ITCHING   Items with * indicate a potential emergency and should be followed up as soon as possible or go to the Emergency Department if any problems should occur.  Please show the CHEMOTHERAPY ALERT CARD or IMMUNOTHERAPY ALERT CARD  at check-in to the Emergency Department and triage nurse.  Should you have questions after your visit or need to cancel or reschedule your appointment, please contact [facility]  Dept: [phone]  and follow the prompts.  Office hours are 8:00 a.m. to 4:30 p.m. Monday - Friday. Please note that voicemails left after 4:00 p.m. may not be returned until the following business day.  We are closed weekends and major holidays. You have access to a nurse at all times for urgent questions. Please call the main number to the clinic Dept: [phone] and follow the prompts.   For any non-urgent questions, you may also contact your provider using MyChart. We now offer e-Visits for anyone 44 and older to request care online for non-urgent symptoms. For details visit [email or web address].   Also download the MyChart app! Go to the app store, search "MyChart", open the app, select [facility], and log in with your MyChart username and password.  Due to Covid, a mask is required upon entering the hospital/clinic. If you do not have a mask, one will be given to you upon arrival. For doctor visits, patients may have 1 support person aged 27 or older with them. For treatment visits, patients cannot have anyone with them due to current Covid guidelines and our immunocompromised population.

## 2021-11-25 ENCOUNTER — Other Ambulatory Visit: Payer: Self-pay | Admitting: Hematology

## 2021-11-28 ENCOUNTER — Encounter: Payer: Self-pay | Admitting: Hematology

## 2021-11-28 MED ORDER — MORPHINE SULFATE ER 15 MG PO TBCR: 15.0000 mg | EXTENDED_RELEASE_TABLET | Freq: Two times a day (BID) | ORAL | 0 refills | Status: DC

## 2021-11-29 ENCOUNTER — Other Ambulatory Visit: Payer: Self-pay | Admitting: Oncology

## 2021-11-30 ENCOUNTER — Encounter: Payer: Self-pay | Admitting: Hematology

## 2021-11-30 MED ORDER — MORPHINE SULFATE ER 15 MG PO TBCR: 15.0000 mg | EXTENDED_RELEASE_TABLET | Freq: Two times a day (BID) | ORAL | 0 refills | Status: DC

## 2021-12-01 ENCOUNTER — Encounter: Payer: Self-pay | Admitting: Hematology

## 2021-12-01 ENCOUNTER — Other Ambulatory Visit: Payer: Medicare Other

## 2021-12-05 ENCOUNTER — Ambulatory Visit (INDEPENDENT_AMBULATORY_CARE_PROVIDER_SITE_OTHER): Payer: Medicare Other | Admitting: Acute Care

## 2021-12-05 ENCOUNTER — Other Ambulatory Visit: Payer: Self-pay | Admitting: Nurse Practitioner

## 2021-12-05 ENCOUNTER — Other Ambulatory Visit: Payer: Self-pay

## 2021-12-05 ENCOUNTER — Encounter: Payer: Self-pay | Admitting: Acute Care

## 2021-12-05 VITALS — BP 138/80 | HR 62 | Ht 71.0 in | Wt 147.0 lb

## 2021-12-05 DIAGNOSIS — C20 Malignant neoplasm of rectum: Secondary | ICD-10-CM

## 2021-12-05 NOTE — Progress Notes (Signed)
History of Present Illness Jorge [name] is a 71 y.o. male  male current every day smoker with history of  rectal cancer, and new findings of lung nodules concerning for primary lung cancer vs metastatic rectal cancer. He had a  Video Bronchoscopy with Robotic Assisted navigational bronch on 09/27/2021 by Dr. Valeta Harms. 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 He is a patient of [name].   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  This was diagnosed in October 2022.  At this time he had a CT scan of the chest abdomen pelvis for staging which showed upper lobe pulmonary nodules within the left side.  Largest greater than 2 cm concerning for primary lung cancer versus metastatic disease.  Patient underwent biopsy of the rectal lesion and has already started radiation treatments.  Patient was referred to [facility] for evaluation of abnormalities within the chest and for consideration of tissue biopsy of the lung lesion.  He is a former smoker.   CURRENT THERAPY:  First line chemo CAPOX every 3 weeks, started on 10/14/21  Presented with rectal bleeding and frequent bowel movement. CT AP 07/23/21 showed rectal mass with no significant adenopathy or distant metastasis. Colonoscopy 08/19/21 showed a 7 cm infiltrative mass in the mid rectum (6-13cm from anal verge), pathology showed adenocarcinoma. MMR is normal -staging pelvic MRI 08/27/21 showed: staging at T4b; N2 likely associated with early extra mesorectal lymph node involvement; distance from tumor to internal anal sphincter is 1.2 cm; also with extramural venous involvement. -baseline CEA 571.84 on 09/06/21. -he received concurrent chemoRT with Xeloda 11/1-11/21/22 under the care of Dr. [name]. He noticed improvement in his symptoms with radiation. CEA from that had showed a drop to 275.15 on 10/14/21. -he underwent bronchscopy and biopsy of the  RUL and RML masses on 09/27/21, which were consistent with metastatic rectal cancer.  -He began CAPOX on 10/14/21, taking Xeloda 1500 mg BID 2 weeks on/1 week off. He tolerated very well overall. CEA dropped again to 133.3 today (11/24/21) -he had port placed 10/24/21. -we held his second cycle CAPOX due to elevated liver enzymes. His levels have decreased significantly (AST- 56, ALT- 58). Will proceed cycle 2 11/24/2021 with reduced Xeloda dose to 2 tabs in am, 3 tabs in pm for 14 days   Taking 1 MS contin per day since 10/06/21 and rarely uses the oxycodone. Goal is to wean off the MS Contin.   Taking iron for anemia, CBC is being trended.    12/05/2021 Pt. Presents for follow up. He has started on chemotherapy per [name]. He states he  has been doing well. He denies any nausea. He has a low body weight, and he has been working on increasing his meals to gain weight. He states he has been eating better. He has been eating cereal and bananas. As snacks.  His weight has increased from 138 lb 3.2 oz (62.7 kg) on 11/28 to 147 lb (66.7 kg) today. I have told him this is really good and to keep up the good work. Adding snacks as needed. He is followed by a dietitian. He is scheduled for a colonoscopy for 12/13/2020 to remove nodules. He does have some wheezing on exam. He denies any shortness of breath . He is not on any inhalers, and states he does not want any.  Denies  chest pain, shortness of breath, cough, or hemoptysis.  Test Results: 08/26/2021 CT Chest without contrast  A 2.2 x 1.9 cm right middle lobe pulmonary nodule as well as a total of four right upper lobe pulmonary nodule and masses measuring up to 3.6 cm. Findings concerning for metastatic primary lung cancer versus less likely metastases in a patient with rectal cancer. Additional imaging evaluation or consultation with Pulmonology or Thoracic Surgery recommended. 2. No gross hilar adenopathy, noting limited sensitivity for the detection  of hilar adenopathy on this noncontrast study.   Cytology 09/27/2021 FINAL MICROSCOPIC DIAGNOSIS:   A. LUNG, RUL TARGET #1, BIOPSY:  - Malignant cells consistent with patient's clinical history of primary  rectal adenocarcinoma, see comment   B. LUNG, RUL TARGET #1, BRUSHING:  - Malignant cells consistent with patient's clinical history of primary  rectal adenocarcinoma   C. LUNG, RML TARGET #2, FINE NEEDLE ASPIRATION:  - Malignant cells consistent with patient's clinical history of primary  rectal adenocarcinoma   D. LUNG, RML TARGET #2, BRUSHING:  - Malignant cells consistent with patient's clinical history of primary  rectal adenocarcinoma   A.  Immunostains for CDX2 and CK20 are positive in the tumor cells,  whereas immunostains for CK7, TTF-1 and Napsin A are negative,  consistent with above interpretation.    CBC Latest Ref Rng & Units 11/24/2021 11/03/2021 10/14/2021  WBC 4.0 - 10.5 K/uL 3.4(L) 3.0(L) 3.5(L)  Hemoglobin 13.0 - 17.0 g/dL 12.4(L) 11.8(L) 12.7(L)  Hematocrit 39.0 - 52.0 % 38.2(L) 36.8(L) 40.4  Platelets 150 - 400 K/uL 201 151 258    BMP Latest Ref Rng & Units 11/24/2021 11/03/2021 10/14/2021  Glucose 70 - 99 mg/dL 97 84 65(L)  BUN 8 - 23 mg/dL _0 Creatinine 0.61 - 1.24 mg/dL 0.73 0.82 0.95  Sodium 135 - 145 mmol/L 135 136 138  Potassium 3.5 - 5.1 mmol/L 4.3 4.2 3.9  Chloride 98 - 111 mmol/L 104 104 103  CO2 22 - 32 mmol/L _1 Calcium 8.9 - 10.3 mg/dL 9.0 8.8(L) 8.8(L)    BNP No results found for: BNP  ProBNP No results found for: PROBNP  PFT No results found for: FEV1PRE, FEV1POST, FVCPRE, FVCPOST, TLC, DLCOUNC, PREFEV1FVCRT, PSTFEV1FVCRT  No results found.   Past medical hx 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 ([place]) 08/19/2021     Social History   Tobacco Use   Smoking status: Some Days    Packs/day: 0.25    Years: 20.00    Pack years: 5.00     Types: Cigarettes   Smokeless tobacco: Never  Vaping Use   Vaping Use: Never used  Substance Use Topics   Alcohol use: Not Currently    Comment: 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, stopped in 02/2021   Drug use: Never   He states he has quit smoking.  Tobacco Cessation: 20 pack year smoking history, quit within the last month per his histroy  Past surgical hx, Family hx, Social hx all reviewed.  Current Outpatient Medications on File Prior to Visit  Medication Sig   capecitabine (XELODA) 500 MG tablet Take 2 tabs in morning and 3 tabs in evening after meals.Take on day 1-14 every 21 days.   carbonyl iron (FEOSOL) 45 MG TABS tablet Take 45 mg by mouth daily.   dexamethasone (DECADRON) 4 MG tablet Take 2 tablets (8 mg total) by mouth daily. Start the day after chemotherapy for 2 days. Take with food.   lidocaine-prilocaine (EMLA) cream Apply 1  application topically as needed.   lidocaine-prilocaine (EMLA) cream Apply to affected area once   loperamide (IMODIUM A-D) 2 MG tablet Take 4 mg by mouth 4 (four) times daily as needed for diarrhea or loose stools.   LORazepam (ATIVAN) 0.5 MG tablet Take 1 tablet (0.5 mg total) by mouth every 6 (six) hours as needed (Nausea or vomiting).   morphine (MS CONTIN) 15 MG 12 hr tablet Take 1 tablet (15 mg total) by mouth every 12 (twelve) hours.   Multiple Vitamin (MULTIVITAMIN WITH MINERALS) TABS tablet Take 1 tablet by mouth daily.   ondansetron (ZOFRAN) 8 MG tablet Take 1 tablet (8 mg total) by mouth every 8 (eight) hours as needed for nausea or vomiting.   ondansetron (ZOFRAN) 8 MG tablet Take 1 tablet (8 mg total) by mouth 2 (two) times daily as needed for refractory nausea / vomiting. Start on day 3 after chemotherapy.   Oxycodone HCl 10 MG TABS Take 1 tablet (10 mg total) by mouth every 8 (eight) hours as needed.   prochlorperazine (COMPAZINE) 10 MG tablet Take 1 tablet (10 mg total) by mouth every 6 (six) hours as needed (Nausea or vomiting).    No current facility-administered medications on file prior to visit.     No Known Allergies  Review Of Systems:  Constitutional:   No  weight loss, night sweats,  Fevers, chills, +fatigue, or  lassitude.  HEENT:   No headaches,  Difficulty swallowing,  Tooth/dental problems, or  Sore throat,                No sneezing, itching, ear ache, nasal congestion, post nasal drip,   CV:  No chest pain,  Orthopnea, PND, swelling in lower extremities, anasarca, dizziness, palpitations, syncope.   GI  No heartburn, indigestion, abdominal pain, nausea, vomiting, diarrhea, change in bowel habits, loss of appetite, bloody stools.   Resp: No shortness of breath with exertion or at rest.  No excess mucus, no productive cough,  No non-productive cough,  No coughing up of blood.  No change in color of mucus.  No wheezing.  No chest wall deformity  Skin: no rash or lesions.  GU: no dysuria, change in color of urine, no urgency or frequency.  No flank pain, no hematuria   MS:  No joint pain or swelling.  No decreased range of motion.  No back pain.  Psych:  No change in mood or affect. No depression or anxiety.  No memory loss.   Vital Signs BP 138/80    Pulse 62    Ht 5' 11" (1.803 m)    Wt 147 lb (66.7 kg)    SpO2 98%    BMI 20.50 kg/m    Physical Exam:  General- No distress,  A&O x 3, pleasant ENT: No sinus tenderness, TM clear, pale nasal mucosa, no oral exudate,no post nasal drip, no LAN Cardiac: S1, S2, regular rate and rhythm, no murmur Chest: + wheeze/ No rales/ dullness; no accessory muscle use, no nasal flaring, no sternal retractions Abd.: Soft Non-tender, ND, BS +, Body mass index is 20.5 kg/m.  Ext: No clubbing cyanosis, edema Neuro:  normal strength, MAE x 4, A&O x 3, appropriate Skin: No rashes, warm and dry, No lesions Psych: normal mood and behavior   Assessment/Plan Rectal Adenocarcinoma with  mets to the lung Treating with CAPOX every 3 weeks, started on  10/14/21 Excellent Weight gain ( 9 pounds since 09/2021) Doing well with chemo Has quit smoking Plan I  am proud of you for quitting smoking. Keep it up.  Follow up with [name] as is scheduled. Colonoscopy 12/13/2021 to remove polyps. Continue to work on weight gain , weigh is better.  Keep up the good work. If you decide you want inhalers if you develop increased shortness of breath call to be seen. Follow up  with Judson Roch NP in 6 months or before as needed.  Please contact office for sooner follow up if symptoms do not improve or worsen or seek emergency care   I spent 35 minutes dedicated to the care of this patient on the date of this encounter to include pre-visit review of records, face-to-face time with the patient discussing conditions above, post visit ordering of testing, clinical documentation with the electronic health record, making appropriate referrals as documented, and communicating necessary information to the patient's healthcare team.     Magdalen Spatz, NP 12/05/2021  12:26 PM

## 2021-12-05 NOTE — Patient Instructions (Signed)
It is good to see you today. I am proud of you for quitting smoking. Keep it up.  Follow up with [name] as is scheduled. Colonoscopy 12/13/2021 to remove polyps. Continue to work on weight gain , weigh is better.  Keep up the good work. If you decide you want inhalers if you develop increased shortness of breath call to be seen. Follow up  with Judson Roch NP in 6 months or before as needed.  Please contact office for sooner follow up if symptoms do not improve or worsen or seek emergency care

## 2021-12-07 ENCOUNTER — Other Ambulatory Visit (HOSPITAL_COMMUNITY): Payer: Self-pay

## 2021-12-08 ENCOUNTER — Other Ambulatory Visit: Payer: Self-pay | Admitting: Nurse Practitioner

## 2021-12-08 ENCOUNTER — Other Ambulatory Visit: Payer: Medicare Other

## 2021-12-08 ENCOUNTER — Encounter: Payer: Self-pay | Admitting: Hematology

## 2021-12-08 ENCOUNTER — Other Ambulatory Visit (HOSPITAL_COMMUNITY): Payer: Self-pay

## 2021-12-08 MED ORDER — OXYCODONE HCL 10 MG PO TABS: 10.0000 mg | ORAL_TABLET | Freq: Three times a day (TID) | ORAL | 0 refills | Status: DC | PRN

## 2021-12-09 ENCOUNTER — Encounter: Payer: Self-pay | Admitting: Hematology

## 2021-12-13 ENCOUNTER — Ambulatory Visit (INDEPENDENT_AMBULATORY_CARE_PROVIDER_SITE_OTHER): Payer: Medicare Other | Admitting: Gastroenterology

## 2021-12-13 ENCOUNTER — Encounter: Payer: Self-pay | Admitting: Gastroenterology

## 2021-12-13 VITALS — BP 110/80 | HR 70 | Ht 72.0 in | Wt 137.2 lb

## 2021-12-13 DIAGNOSIS — C20 Malignant neoplasm of rectum: Secondary | ICD-10-CM | POA: Diagnosis not present

## 2021-12-13 DIAGNOSIS — D122 Benign neoplasm of ascending colon: Secondary | ICD-10-CM

## 2021-12-13 DIAGNOSIS — R933 Abnormal findings on diagnostic imaging of other parts of digestive tract: Secondary | ICD-10-CM

## 2021-12-13 DIAGNOSIS — Z86010 Personal history of colonic polyps: Secondary | ICD-10-CM

## 2021-12-13 NOTE — Progress Notes (Signed)
GASTROENTEROLOGY OUTPATIENT CLINIC VISIT   Primary Care Provider Pcp, No No address on file None   Patient Profile: Jorge [name] is a 71 y.o. male with a pmh significant for metastatic rectal cancer (pulmonary mets), anemia, hemorrhoids, colon polyps (TAs and a large HF polyp left in situ).  The patient presents to the [facility] for an evaluation and management of problem(s) noted below:  Problem List 1. Rectal cancer ([place])   2. Adenomatous polyp of hepatic flexure   3. Hx of adenomatous colonic polyps   4. Abnormal colonoscopy     History of Present Illness Today, the patient can eat.  This is a patient that I met for a direct colonoscopy in October of last year where I found he had a rectal mass as well as multiple colon polyps and a large hepatic flexure polyp.  His rectal mass returned as adenocarcinoma.  He sees [name] for his oncologic care and has undergone chemoradiation.  He is done quite well with that overall.  He continues on infusional chemotherapy at this time.  He has met with Dr. Marcello Moores for consideration of whether he could be a candidate for surgical resection but after finding of metastatic disease that was put on hold as well as his nondesire of having an ostomy even if it was temporary.  The patient feels stronger after his initial chemoradiation but still has issues of nausea around the time of his infusional chemotherapy.  He has a bowel movement on a daily to every other day basis.  He is using his narcotic medications which has not been helpful and he tries to minimize use possible.  He is not noting any blood in his stools at this point.  GI Review of Systems Positive as above Negative for pyrosis, dysphagia, odynophagia, abdominal pain, melena, hematochezia  Review of Systems General: Denies fevers/chills/weight loss unintentionally HEENT: Denies oral lesions Cardiovascular: Denies chest pain Pulmonary: Denies shortness of  breath Gastroenterological: See HPI Genitourinary: Denies darkened urine Hematological: Denies easy bruising/bleeding Dermatological: Denies jaundice Psychological: Mood is stable   Medications Current Outpatient Medications  Medication Sig Dispense Refill   capecitabine (XELODA) 500 MG tablet Take 2 tabs in morning and 3 tabs in evening after meals.Take on day 1-14 every 21 days. 70 tablet 1   carbonyl iron (FEOSOL) 45 MG TABS tablet Take 45 mg by mouth daily.     dexamethasone (DECADRON) 4 MG tablet Take 2 tablets (8 mg total) by mouth daily. Start the day after chemotherapy for 2 days. Take with food. 30 tablet 1   lidocaine-prilocaine (EMLA) cream Apply 1 application topically as needed. 30 g 2   LORazepam (ATIVAN) 0.5 MG tablet Take 1 tablet (0.5 mg total) by mouth every 6 (six) hours as needed (Nausea or vomiting). 30 tablet 0   morphine (MS CONTIN) 15 MG 12 hr tablet Take 1 tablet (15 mg total) by mouth every 12 (twelve) hours. 60 tablet 0   Multiple Vitamin (MULTIVITAMIN WITH MINERALS) TABS tablet Take 1 tablet by mouth daily.     ondansetron (ZOFRAN) 8 MG tablet Take 1 tablet (8 mg total) by mouth every 8 (eight) hours as needed for nausea or vomiting. 30 tablet 0   Oxycodone HCl 10 MG TABS Take 1 tablet (10 mg total) by mouth every 8 (eight) hours as needed. 30 tablet 0   prochlorperazine (COMPAZINE) 10 MG tablet Take 1 tablet (10 mg total) by mouth every 6 (six) hours as needed (Nausea or  vomiting). 30 tablet 1   No current facility-administered medications for this visit.    Allergies No Known Allergies  Histories 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 ([place]) 08/19/2021   Past Surgical History:  Procedure Laterality Date   BRONCHIAL BIOPSY  09/27/2021   Procedure: BRONCHIAL BIOPSIES;  Surgeon: Garner Nash, DO;  Location: [facility];  Service: Pulmonary;;   BRONCHIAL BRUSHINGS  09/27/2021    Procedure: BRONCHIAL BRUSHINGS;  Surgeon: Garner Nash, DO;  Location: [facility];  Service: Pulmonary;;   BRONCHIAL NEEDLE ASPIRATION BIOPSY  09/27/2021   Procedure: BRONCHIAL NEEDLE ASPIRATION BIOPSIES;  Surgeon: Garner Nash, DO;  Location: [facility];  Service: Pulmonary;;   COLONOSCOPY     IR IMAGING GUIDED PORT INSERTION  10/24/2021   left breast surgery Left 1968   VIDEO BRONCHOSCOPY WITH RADIAL ENDOBRONCHIAL ULTRASOUND  09/27/2021   Procedure: RADIAL ENDOBRONCHIAL ULTRASOUND;  Surgeon: Garner Nash, DO;  Location: [facility];  Service: Pulmonary;;   Social History   Socioeconomic History   Marital status: Widowed    Spouse name: Not on file   Number of children: 5   Years of education: Not on file   Highest education level: Not on file  Occupational History   Not on file  Tobacco Use   Smoking status: Some Days    Packs/day: 0.25    Years: 20.00    Pack years: 5.00    Types: Cigarettes   Smokeless tobacco: Never  Vaping Use   Vaping Use: Never used  Substance and Sexual Activity   Alcohol use: Not Currently    Comment: 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, stopped in 02/2021   Drug use: Never   Sexual activity: Not on file  Other Topics Concern   Not on file  Social History Narrative   Not on file   Social Determinants of Health   Financial Resource Strain: Not on file  Food Insecurity: Not on file  Transportation Needs: Not on file  Physical Activity: Not on file  Stress: Not on file  Social Connections: Not on file  Intimate Partner Violence: Not on file   Family History  Problem Relation Age of Onset   Cancer Mother 1       unknown type cancer   Colon cancer Neg Hx    Esophageal cancer Neg Hx    Rectal cancer Neg Hx    Stomach cancer Neg Hx    Inflammatory bowel disease Neg Hx    Liver disease Neg Hx    Pancreatic cancer Neg Hx    I have reviewed his medical, social, and family history in detail and updated the electronic  medical record as necessary.    PHYSICAL EXAMINATION  BP 110/80    Pulse 70    Ht 6' (1.829 m)    Wt 137 lb 4 oz (62.3 kg)    BMI 18.61 kg/m  Wt Readings from Last 3 Encounters:  12/13/21 137 lb 4 oz (62.3 kg)  12/05/21 147 lb (66.7 kg)  11/24/21 140 lb 11.2 oz (63.8 kg)  GEN: NAD, appears stated age, doesn't appear chronically ill PSYCH: Cooperative, without pressured speech EYE: Conjunctivae pink, sclerae anicteric ENT: MMM CV: Nontachycardic RESP: No audible wheezing GI: NABS, soft, NT/ND, without rebound or guarding MSK/EXT: No lower extremity edema SKIN: No jaundice NEURO:  Alert & Oriented x 3, no focal deficits   REVIEW OF DATA  I reviewed the following data at the time of this encounter:  GI Procedures and Studies  October 2022 Colonoscopy - Rectal tenderness, palpable rectal mass and hemorrhoids found on digital rectal exam. - Stool in the entire examined colon - lavaged with still inadequate clearance. - One 35 mm polyp at the hepatic flexure. Biopsied. Tattooed distal in case future endoscopic resection is considered - however, has larger issues with rectal mass currently as below. - Four, 5 to 11 mm polyps in the transverse colon and at the hepatic flexure, removed with a cold snare. Resected and retrieved. - Diverticulosis in the recto-sigmoid colon and in the sigmoid colon. - Rule out malignancy, partially obstructing tumor in the rectum and from 6 to 13 cm proximal to the anus. Biopsied.  Pathology Diagnosis 1. Hepatic Flexure Biopsy - TUBULAR ADENOMA WITHOUT HIGH-GRADE DYSPLASIA OR MALIGNANCY 2. Transverse Colon Biopsy, and hepatic flexure, polyps (4) - TUBULAR ADENOMA WITHOUT HIGH-GRADE DYSPLASIA OR MALIGNANCY - OTHER FRAGMENTS OF POLYPOID COLONIC MUCOSA WITH NO SPECIFIC HISTOPATHOLOGIC CHANGES - FOOD MATERIAL 3. Rectum, biopsy - ADENOCARCINOMA. SEE NOTE  Laboratory Studies  Reviewed those in epic  Imaging Studies  September 2022 CT abdomen pelvis  with contrast IMPRESSION: 1. Appearance of the rectum likely indicates a rectal mass suspicious for rectal carcinoma. Inflammatory process would be less likely. Direct visualization is suggested. 2. Cholelithiasis without evidence of acute cholecystitis. 3. Aortic atherosclerosis.   ASSESSMENT  Mr. Capozzoli is a 71 y.o. male with a pmh significant for metastatic rectal cancer (pulmonary mets), anemia, hemorrhoids, colon polyps (TAs and a large HF polyp left in situ).  The patient is seen today for evaluation and management of:  1. Rectal cancer ([place])   2. Adenomatous polyp of hepatic flexure   3. Hx of adenomatous colonic polyps   4. Abnormal colonoscopy    The patient is hemodynamically stable.  Clinically, it seems like he has had good response to his chemoradiation.  Since I performed his initial endoscopy and diagnosis of rectal cancer, based upon the polyp features I saw in the hepatic flexure polyp, I do feel that it is reasonable to pursue an Advanced Polypectomy attempt of the polyp/lesion.  We discussed some of the techniques of advanced polypectomy which include Endoscopic Mucosal Resection, OVESCO Full-Thickness Resection, Endorotor Morcellation, and Tissue Ablation via Fulguration.  We also reviewed images of typical techniques as noted above.  The risks and benefits of endoscopic evaluation were discussed with the patient; these include but are not limited to the risk of perforation, infection, bleeding, missed lesions, lack of diagnosis, severe illness requiring hospitalization, as well as anesthesia and sedation related illnesses.  During attempts at advanced resection, the risks of bleeding and perforation/leak are increased as opposed to diagnostic and screening procedures, and that was discussed with the patient as well.   In addition, I explained that with the possible need for piecemeal resection, subsequent short-interval endoscopic evaluation for follow up and potential  retreatment of the lesion/area may be necessary.  If, after attempt at removal of the polyp/lesion, it is found that the patient has a complication or that an invasive lesion or malignant lesion is found, or that the polyp/lesion continues to recur, the patient is aware and understands that surgery may still be indicated/required.  Because the patient has metastatic rectal cancer, it is not clear to me what his life expectancy is.  The patient is somewhat hesitant about the somewhat increased nature of the colonoscopy but would rather try not to have  a complication occur if possible but also wants to try not to have any other cancers develop.  He is leaning towards wanting to pursue a possible colonoscopy but right now cannot decide and wants to discuss things further with Dr.  Burr Medico.  As such we will hold off on scheduling his colonoscopy.  He will be seeing her later this week I will forward a message to her as well so that she may be able to help him understand his life expectancy and where things go and from there we can reevaluate the potential role of colonoscopy for attempt at large polyp removal versus continued monitoring in the setting of his metastatic rectal cancer.  All patient questions were answered, to the best of my ability, and the patient agrees to the aforementioned plan of action with follow-up as indicated.   PLAN  Patient leaning towards wanting repeat colonoscopy and attempt at higher risk resection - However he seems somewhat unclear about his metastatic rectal cancer diagnosis as well as life expectancy - If life expectancy is not expected to be a few years, then potential repeat colonoscopy and large polyp resection may not be worth him going through and at that point he would probably decide to hold on colonoscopy Follow-up with oncology for chemotherapy as planned   No orders of the defined types were placed in this encounter.   New Prescriptions   No medications on file    Modified Medications   No medications on file    Planned Follow Up No follow-ups on file.   Total Time in Face-to-Face and in Coordination of Care for patient including independent/personal interpretation/review of prior testing, medical history, examination, medication adjustment, communicating results with the patient directly, and documentation within the EHR is 30 minutes.   Justice Britain, MD [facility] Advanced [facility]oscopy Office # [phone]

## 2021-12-13 NOTE — Patient Instructions (Addendum)
Keep appointment for your Chemotherapy. Discuss with Dr.Feng to see if she feels that you are a candidate for further colonoscopies. After you have seen her, Dr.Feng and Dr. [name] will decide if future colonoscopies will be needed.   If you are age 71 or older, your body mass index should be between 23-30. Your Body mass index is 18.61 kg/m. If this is out of the aforementioned range listed, please consider follow up with your Primary Care Provider.  If you are age 76 or younger, your body mass index should be between 19-25. Your Body mass index is 18.61 kg/m. If this is out of the aformentioned range listed, please consider follow up with your Primary Care Provider.   ________________________________________________________  The [facility] providers would like to encourage you to use [facility] to communicate with providers for non-urgent requests or questions.  Due to long hold times on the telephone, sending your provider a message by [facility] may be a faster and more efficient way to get a response.  Please allow 48 business hours for a response.  Please remember that this is for non-urgent requests.  _______________________________________________________  Thank you for choosing me and [facility].  Dr. [name]

## 2021-12-14 ENCOUNTER — Encounter: Payer: Self-pay | Admitting: Gastroenterology

## 2021-12-14 DIAGNOSIS — R933 Abnormal findings on diagnostic imaging of other parts of digestive tract: Secondary | ICD-10-CM | POA: Insufficient documentation

## 2021-12-14 DIAGNOSIS — Z86010 Personal history of colonic polyps: Secondary | ICD-10-CM | POA: Insufficient documentation

## 2021-12-14 DIAGNOSIS — D122 Benign neoplasm of ascending colon: Secondary | ICD-10-CM | POA: Insufficient documentation

## 2021-12-14 MED FILL — Dexamethasone Sodium Phosphate Inj 100 MG/10ML: INTRAMUSCULAR | Qty: 1 | Status: AC

## 2021-12-15 ENCOUNTER — Inpatient Hospital Stay: Payer: Medicare Other

## 2021-12-15 ENCOUNTER — Inpatient Hospital Stay (HOSPITAL_BASED_OUTPATIENT_CLINIC_OR_DEPARTMENT_OTHER): Payer: Medicare Other | Admitting: Nurse Practitioner

## 2021-12-15 ENCOUNTER — Inpatient Hospital Stay: Payer: Medicare Other | Attending: Physician Assistant

## 2021-12-15 ENCOUNTER — Encounter: Payer: Self-pay | Admitting: Nurse Practitioner

## 2021-12-15 ENCOUNTER — Other Ambulatory Visit: Payer: Self-pay

## 2021-12-15 VITALS — BP 154/80 | HR 62 | Temp 97.8°F | Resp 17 | Ht 72.0 in | Wt 138.5 lb

## 2021-12-15 DIAGNOSIS — C7800 Secondary malignant neoplasm of unspecified lung: Secondary | ICD-10-CM | POA: Diagnosis not present

## 2021-12-15 DIAGNOSIS — Z5111 Encounter for antineoplastic chemotherapy: Secondary | ICD-10-CM | POA: Insufficient documentation

## 2021-12-15 DIAGNOSIS — Z5112 Encounter for antineoplastic immunotherapy: Secondary | ICD-10-CM | POA: Diagnosis not present

## 2021-12-15 DIAGNOSIS — Z95828 Presence of other vascular implants and grafts: Secondary | ICD-10-CM

## 2021-12-15 DIAGNOSIS — C775 Secondary and unspecified malignant neoplasm of intrapelvic lymph nodes: Secondary | ICD-10-CM | POA: Diagnosis not present

## 2021-12-15 DIAGNOSIS — C20 Malignant neoplasm of rectum: Secondary | ICD-10-CM | POA: Insufficient documentation

## 2021-12-15 DIAGNOSIS — Z79899 Other long term (current) drug therapy: Secondary | ICD-10-CM | POA: Diagnosis not present

## 2021-12-15 DIAGNOSIS — C7801 Secondary malignant neoplasm of right lung: Secondary | ICD-10-CM | POA: Diagnosis not present

## 2021-12-15 LAB — CBC WITH DIFFERENTIAL (CANCER CENTER ONLY)
Abs Immature Granulocytes: 0.01 10*3/uL (ref 0.00–0.07)
Basophils Absolute: 0 10*3/uL (ref 0.0–0.1)
Basophils Relative: 0 %
Eosinophils Absolute: 0 10*3/uL (ref 0.0–0.5)
Eosinophils Relative: 1 %
HCT: 37.4 % — ABNORMAL LOW (ref 39.0–52.0)
Hemoglobin: 12.1 g/dL — ABNORMAL LOW (ref 13.0–17.0)
Immature Granulocytes: 0 %
Lymphocytes Relative: 33 %
Lymphs Abs: 1.1 10*3/uL (ref 0.7–4.0)
MCH: 28.8 pg (ref 26.0–34.0)
MCHC: 32.4 g/dL (ref 30.0–36.0)
MCV: 89 fL (ref 80.0–100.0)
Monocytes Absolute: 0.7 10*3/uL (ref 0.1–1.0)
Monocytes Relative: 22 %
Neutro Abs: 1.4 10*3/uL — ABNORMAL LOW (ref 1.7–7.7)
Neutrophils Relative %: 44 %
Platelet Count: 215 10*3/uL (ref 150–400)
RBC: 4.2 MIL/uL — ABNORMAL LOW (ref 4.22–5.81)
RDW: 18.1 % — ABNORMAL HIGH (ref 11.5–15.5)
WBC Count: 3.2 10*3/uL — ABNORMAL LOW (ref 4.0–10.5)
nRBC: 0 % (ref 0.0–0.2)

## 2021-12-15 LAB — TOTAL PROTEIN, URINE DIPSTICK: Protein, ur: NEGATIVE mg/dL

## 2021-12-15 LAB — CMP (CANCER CENTER ONLY)
ALT: 19 U/L (ref 0–44)
AST: 32 U/L (ref 15–41)
Albumin: 3.2 g/dL — ABNORMAL LOW (ref 3.5–5.0)
Alkaline Phosphatase: 95 U/L (ref 38–126)
Anion gap: 2 — ABNORMAL LOW (ref 5–15)
BUN: 9 mg/dL (ref 8–23)
CO2: 31 mmol/L (ref 22–32)
Calcium: 8.9 mg/dL (ref 8.9–10.3)
Chloride: 102 mmol/L (ref 98–111)
Creatinine: 0.76 mg/dL (ref 0.61–1.24)
GFR, Estimated: >60 mL/min (ref >60)
Glucose, Bld: 96 mg/dL (ref 70–99)
Potassium: 4.5 mmol/L (ref 3.5–5.1)
Sodium: 135 mmol/L (ref 135–145)
Total Bilirubin: 0.6 mg/dL (ref 0.3–1.2)
Total Protein: 7.1 g/dL (ref 6.5–8.1)

## 2021-12-15 LAB — CEA (IN HOUSE-CHCC): CEA (CHCC-In House): 52.85 ng/mL — ABNORMAL HIGH (ref 0.00–5.00)

## 2021-12-15 MED ORDER — SODIUM CHLORIDE 0.9 % IV SOLN
10.0000 mg | Freq: Once | INTRAVENOUS | Status: AC
  Administered 2021-12-15: 10 mg via INTRAVENOUS
  Filled 2021-12-15: qty 10

## 2021-12-15 MED ORDER — HEPARIN SOD (PORK) LOCK FLUSH 100 UNIT/ML IV SOLN
500.0000 [IU] | Freq: Once | INTRAVENOUS | Status: AC | PRN
  Administered 2021-12-15: 500 [IU]

## 2021-12-15 MED ORDER — PALONOSETRON HCL INJECTION 0.25 MG/5ML
0.2500 mg | Freq: Once | INTRAVENOUS | Status: AC
  Administered 2021-12-15: 0.25 mg via INTRAVENOUS
  Filled 2021-12-15: qty 5

## 2021-12-15 MED ORDER — SODIUM CHLORIDE 0.9% FLUSH
10.0000 mL | INTRAVENOUS | Status: DC | PRN
  Administered 2021-12-15: 10 mL

## 2021-12-15 MED ORDER — DEXTROSE 5 % IV SOLN: Freq: Once | INTRAVENOUS | Status: AC

## 2021-12-15 MED ORDER — SODIUM CHLORIDE 0.9 % IV SOLN
7.5000 mg/kg | Freq: Once | INTRAVENOUS | Status: AC
  Administered 2021-12-15: 500 mg via INTRAVENOUS
  Filled 2021-12-15: qty 16

## 2021-12-15 MED ORDER — OXALIPLATIN CHEMO INJECTION 100 MG/20ML
120.0000 mg/m2 | Freq: Once | INTRAVENOUS | Status: AC
  Administered 2021-12-15: 210 mg via INTRAVENOUS
  Filled 2021-12-15: qty 40

## 2021-12-15 MED ORDER — SODIUM CHLORIDE 0.9% FLUSH
10.0000 mL | Freq: Once | INTRAVENOUS | Status: AC
  Administered 2021-12-15: 10 mL

## 2021-12-15 NOTE — Progress Notes (Signed)
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 Appearance of the rectum likely indicates a rectal mass suspicious for rectal carcinoma. Inflammatory process would be less likely. Direct visualization is suggested. 2. Cholelithiasis without evidence of acute cholecystitis. 3. Aortic atherosclerosis.   08/19/2021 Procedure   Colonoscopy, under Dr. [name]  Impression: - Rectal tenderness, palpable rectal mass and hemorrhoids found on digital rectal exam. - Stool in the entire examined colon - lavaged with still inadequate clearance. - One 35 mm polyp at the hepatic flexure. Biopsied. Tattooed distal in case future endoscopic resection is considered - however, has larger issues with rectal mass currently as below. - Four, 5 to 11 mm polyps in the transverse colon and at the hepatic flexure, removed with a cold snare. Resected and retrieved. - Diverticulosis in the recto-sigmoid colon and in the sigmoid colon. - Rule out malignancy, partially obstructing tumor in the rectum and from 6 to 13 cm proximal to the anus. Biopsied.   08/19/2021 Pathology Results   Diagnosis 1. Hepatic Flexure Biopsy - TUBULAR ADENOMA WITHOUT HIGH-GRADE DYSPLASIA OR MALIGNANCY 2. Transverse Colon Biopsy, and hepatic flexure, polyps (4) - TUBULAR ADENOMA WITHOUT HIGH-GRADE DYSPLASIA OR MALIGNANCY - OTHER FRAGMENTS OF POLYPOID COLONIC MUCOSA WITH NO SPECIFIC HISTOPATHOLOGIC CHANGES - FOOD MATERIAL 3. Rectum, biopsy - ADENOCARCINOMA. SEE  NOTE   08/22/2021 Initial Diagnosis   Rectal cancer ([place])   08/26/2021 Imaging   IMPRESSION: 1. A 2.2 x 1.9 cm right middle lobe pulmonary nodule as well as a total of four right upper lobe pulmonary nodule and masses measuring up to 3.6 cm. Findings concerning for metastatic primary lung cancer versus less likely metastases in a patient with rectal cancer. Additional imaging evaluation or consultation with Pulmonology or Thoracic Surgery recommended. 2. No gross hilar adenopathy, noting limited sensitivity for the detection of hilar adenopathy on this noncontrast study. 3. Cholelithiasis. 4.  Emphysema (ICD10-J43.9). 5. At least left anterior descending coronary artery calcifications.   08/27/2021 Imaging   IMPRESSION: Rectal adenocarcinoma T stage: T4 B   Rectal adenocarcinoma N stage: N2 disease likely associated with early extra mesorectal lymph node involvement.   Distance from tumor to the internal anal sphincter is 1.2 cm.   Also with extramural venous involvement as described.   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 Santucci returns for follow up and treatment as scheduled. Last seen by [name] 11/24/21 and completed  cycle 2 CAPOX and Bevacizumab. Xeloda was dose reduced to 1000 mg AM and 1500 mg PM for transaminitis. He was seen by pulmonology 12/05/21 and GI 12/13/21.   He presents today by himself,  feeling well in general with normal activity. Eating and drinking well. He continued Xeloda for 19 days out of being in the habit of taking it, last dose 12/12/2021.  He is tolerating well with no significant side effects.  Denies rectal pain or bleeding, bowels moving normally.  He went back up to MS Contin to twice daily because he does not want the pain to come back. When I spoke to his daughter, she notes that he missed a dose and was in "severe pain." Denies nausea/vomiting, cold sensitivity or neuropathy, hand/foot syndrome, fever, chills, cough, chest pain, dyspnea, or new concerning/specific complaints.   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 ([place]) 08/19/2021    SURGICAL HISTORY: Past Surgical History:  Procedure Laterality Date   BRONCHIAL BIOPSY  09/27/2021   Procedure: BRONCHIAL BIOPSIES;  Surgeon: Garner Nash, DO;  Location: [facility];  Service: Pulmonary;;   BRONCHIAL BRUSHINGS  09/27/2021   Procedure: BRONCHIAL BRUSHINGS;  Surgeon: Garner Nash, DO;  Location: [facility];  Service: Pulmonary;;   BRONCHIAL NEEDLE ASPIRATION BIOPSY  09/27/2021   Procedure: BRONCHIAL NEEDLE ASPIRATION BIOPSIES;  Surgeon: Garner Nash, DO;  Location: [facility];  Service: Pulmonary;;   COLONOSCOPY     IR IMAGING GUIDED PORT INSERTION  10/24/2021   left breast surgery Left 1968   VIDEO BRONCHOSCOPY WITH RADIAL ENDOBRONCHIAL ULTRASOUND  09/27/2021   Procedure: RADIAL ENDOBRONCHIAL ULTRASOUND;  Surgeon: Garner Nash, DO;  Location: [facility];  Service: Pulmonary;;    I have reviewed the social history and family history with the patient and they are unchanged from previous note.  ALLERGIES:  has No Known Allergies.  MEDICATIONS:  Current Outpatient Medications  Medication Sig Dispense Refill   capecitabine (XELODA) 500 MG tablet Take 2 tabs in morning and 3 tabs in evening after  meals.Take on day 1-14 every 21 days. 70 tablet 1   carbonyl iron (FEOSOL) 45 MG TABS tablet Take 45 mg by mouth daily.     dexamethasone (DECADRON) 4 MG tablet Take 2 tablets (8 mg total) by mouth daily. Start the day after chemotherapy for 2 days. Take with food. 30 tablet 1   lidocaine-prilocaine (EMLA) cream Apply 1 application topically as needed. 30 g 2   LORazepam (ATIVAN) 0.5 MG tablet Take 1 tablet (0.5 mg total) by mouth every 6 (six) hours as needed (Nausea or vomiting). 30 tablet 0   morphine (MS CONTIN) 15 MG 12 hr tablet Take 1 tablet (15 mg total) by mouth every 12 (twelve) hours. 60 tablet 0   Multiple Vitamin (MULTIVITAMIN WITH MINERALS) TABS tablet Take 1 tablet by mouth daily.     ondansetron (ZOFRAN) 8 MG tablet Take 1 tablet (8 mg total) by mouth every 8 (eight) hours as needed for nausea or vomiting. 30 tablet 0   Oxycodone HCl 10 MG TABS Take 1 tablet (10 mg total) by mouth every 8 (eight) hours as needed. 30 tablet 0   prochlorperazine (COMPAZINE) 10 MG tablet Take 1 tablet (10 mg total) by mouth every 6 (six) hours as needed (Nausea or vomiting). 30 tablet 1   No current facility-administered medications for this visit.   Facility-Administered Medications Ordered in Other Visits  Medication Dose Route Frequency Provider Last Rate Last Admin   dexamethasone (DECADRON) 10 mg in sodium chloride 0.9 % 50 mL IVPB  10 mg Intravenous Once [name], MD       heparin lock flush 100 unit/mL  500 Units Intracatheter Once PRN [name], MD       oxaliplatin (ELOXATIN) 210 mg in dextrose 5 % 500 mL chemo infusion  120 mg/m2 (Treatment Plan Recorded) Intravenous Once [name], MD       palonosetron (ALOXI) injection 0.25 mg  0.25 mg Intravenous Once [name], MD       sodium chloride flush (NS) 0.9 % injection 10 mL  10 mL Intracatheter PRN [name], MD        PHYSICAL EXAMINATION: ECOG PERFORMANCE STATUS: 0 - Asymptomatic  Vitals:   12/15/21 1228  BP: (!) 154/80  Pulse: 62   Resp: 17  Temp: 97.8 F (36.6 C)  SpO2: 100%   Filed Weights   12/15/21 1228  Weight: 138 lb 8 oz (62.8 kg)    GENERAL:alert, no distress and comfortable SKIN: No rash.  Palms without erythema EYES: sclera clear LUNGS: normal breathing effort HEART: no lower extremity edema ABDOMEN:abdomen soft, non-tender and normal bowel sounds NEURO: alert & oriented x 3 with fluent speech, no focal motor deficits PAC without erythema  LABORATORY DATA:  I have reviewed the data as listed CBC Latest Ref Rng & Units 12/15/2021 11/24/2021 11/03/2021  WBC 4.0 - 10.5 K/uL 3.2(L) 3.4(L) 3.0(L)  Hemoglobin 13.0 - 17.0 g/dL 12.1(L) 12.4(L) 11.8(L)  Hematocrit 39.0 - 52.0 % 37.4(L) 38.2(L) 36.8(L)  Platelets 150 - 400 K/uL 215 201 151     CMP Latest Ref Rng & Units 12/15/2021 11/24/2021 11/03/2021  Glucose 70 - 99 mg/dL 96 97 84  BUN 8 - 23 mg/dL '9 10 8  ' Creatinine 0.61 - 1.24 mg/dL 0.76 0.73 0.82  Sodium 135 - 145 mmol/L 135 135 136  Potassium 3.5 - 5.1 mmol/L 4.5 4.3 4.2  Chloride 98 - 111 mmol/L 102 104 104  CO2 22 - 32 mmol/L '31 29 29  ' Calcium 8.9 - 10.3 mg/dL 8.9 9.0 8.8(L)  Total Protein 6.5 - 8.1 g/dL 7.1 7.8 6.9  Total Bilirubin 0.3 - 1.2 mg/dL 0.6 0.5 0.7  Alkaline Phos 38 - 126 U/L 95 113 100  AST 15 - 41 U/L 32 56(H) 438(HH)  ALT 0 - 44 U/L 19 58(H) 357(HH)      RADIOGRAPHIC STUDIES: I have personally reviewed the radiological images as listed and agreed with the findings in the report. No results found.   ASSESSMENT & PLAN: FLEM ENDERLE is a 71 y.o. male with    1. Rectal Adenocarcinoma, [phone] with lung metastasis, MMR normal  -Presented with rectal bleeding and frequent bowel movement. CT AP 07/23/21 showed rectal mass with no significant adenopathy or distant metastasis. Colonoscopy 08/19/21 showed a 7 cm infiltrative mass in the mid rectum (6-13cm from anal verge), pathology showed adenocarcinoma. MMR is normal -staging pelvic MRI 08/27/21 showed: staging at T4b; N2  likely associated with early extra mesorectal lymph node involvement; distance from tumor to internal anal sphincter is 1.2 cm; also with extramural venous involvement. -baseline CEA 571.84 on 09/06/21. -he received concurrent chemoRT with Xeloda 11/1-11/21/22 under the care of Dr. [name].  He responded clinically, no recurrent rectal bleeding, his pain has improved  -CEA has decreased on treatment -he underwent bronchscopy and biopsy of the RUL and RML masses on 09/27/21, which were  consistent with metastatic rectal cancer.  -He began first-line CAPOX and bevacizumab on 10/14/21, taking Xeloda 1500 mg BID 2 weeks on/1 week off. He tolerated very well overall.  -Cycle 2 CAPOX held due to elevated liver enzymes, resumed with dose-reduced Xeloda upon improvement, takes Xeloda at 1000 mg a.m. and 1500 mg p.m. for 14 days  -f/u in 3 weeks    2. Symptom Management: Rectal pain, weight loss, fatigue -Secondary to rectal cancer  -Improved after chemo RT, takes MS Contin 1-2 times daily, no longer taking oxycodone  -His daughter notes pain increases if he skips MS Contin, continue twice daily -Continue follow-up with dietitian   3. Mild anemia from rectal bleeding, probable iron deficiency -He has started oral iron, tolerating well.  We will continue. -hgb overall stable, mild anemia at 11-12.8 range -Continue monitoring   4. Pulmonary nodules/metastatic rectal adenocarcinoma -Seen on chest CT 08/26/21 showing 2.2 cm RML nodule and four RUL nodules/masses measuring up to 3.6 cm. Findings concerning for metastatic primary lung cancer. -He met with Dr. Valeta Harms who performed bronchoscopy. The RML and RUL lesions were positive for metastatic rectal adenocarcinoma.   -He denies any chest pain, shortness of breath, cough, or hemoptysis.    5. Multiple colon polyps -He has a large 3.5 cm polyps in the hepatic flexure, biopsy showed tubular adenoma. He is scheduled for f/u with Dr. [name] on 12/13/21.  They  discussed polypectomy/resection to remove it -In the context of his stage IV rectal cancer, he prefers to defer resection for now, which we agree -He had additional 4 small polyps which were removed, path showed tubular adenomas.    Disposition: Mr. Pellum appears stable.  S/p 2 cycles of Capox and bevacizumab, tolerating treatment well without significant side effects.  Side effects are adequately managed with supportive care at home.  He is able to recover and function well.  There is no clinical evidence of disease progression.  Rectal pain improved after chemo RT and maintained on MS Contin twice daily.   Labs reviewed, transaminitis resolved.  Neutropenia is mild with ANC 1.4.  Labs adequate to proceed with oxaliplatin today as planned.  He took Xeloda for 19 days, last dose 12/12/2021.  He will remain off until 12/20/2021 he will take 2 tabs a.m./3 tabs p.m. for 7 days on then 7 days off.  When he returns for follow-up and next cycle of oxaliplatin/Beva in 3 weeks he will resume normal Xeloda dosing.  We discussed removing the adenomatous polyp at the hepatic flexure which would require larger polypectomy/resection.  In the context of patient's metastatic rectal cancer and his wishes, he would like to defer this for now, which is reasonable.  Patient does wish to revisit the discussion in 3-6 months depending on his tolerance and response to chemo.  I communicated this to Dr. [name] and Dr. Marcello Moores.   The plan was reviewed with [name], explained to the patient in great detail, and reviewed with his daughter on the phone.  All questions were answered. The patient knows to call the clinic with any problems, questions or concerns. No barriers to learning was detected. I spent 20 minutes counseling the patient face to face. The total time spent in the appointment was 30 minutes and more than 50% was on counseling and review of test results     Alla Feeling, NP 12/15/21

## 2021-12-15 NOTE — Progress Notes (Signed)
Ok to treat today with an ANC of 1.4 K/uL per Cira Rue, NP

## 2021-12-15 NOTE — Patient Instructions (Signed)
[facility]  Discharge Instructions: Thank you for choosing [facility] to provide your oncology and hematology care.   If you have a lab appointment with the [facility], please go directly to the [facility] and check in at the registration area.   Wear comfortable clothing and clothing appropriate for easy access to any Portacath or PICC line.   We strive to give you quality time with your provider. You may need to reschedule your appointment if you arrive late (15 or more minutes).  Arriving late affects you and other patients whose appointments are after yours.  Also, if you miss three or more appointments without notifying the office, you may be dismissed from the clinic at the providers discretion.      For prescription refill requests, have your pharmacy contact our office and allow 72 hours for refills to be completed.    Today you received the following chemotherapy and/or immunotherapy agents: Bevacizumab, Oxaliplatin.      To help prevent nausea and vomiting after your treatment, we encourage you to take your nausea medication as directed.  BELOW ARE SYMPTOMS THAT SHOULD BE REPORTED IMMEDIATELY: *FEVER GREATER THAN 100.4 F (38 C) OR HIGHER *CHILLS OR SWEATING *NAUSEA AND VOMITING THAT IS NOT CONTROLLED WITH YOUR NAUSEA MEDICATION *UNUSUAL SHORTNESS OF BREATH *UNUSUAL BRUISING OR BLEEDING *URINARY PROBLEMS (pain or burning when urinating, or frequent urination) *BOWEL PROBLEMS (unusual diarrhea, constipation, pain near the anus) TENDERNESS IN MOUTH AND THROAT WITH OR WITHOUT PRESENCE OF ULCERS (sore throat, sores in mouth, or a toothache) UNUSUAL RASH, SWELLING OR PAIN  UNUSUAL VAGINAL DISCHARGE OR ITCHING   Items with * indicate a potential emergency and should be followed up as soon as possible or go to the Emergency Department if any problems should occur.  Please show the CHEMOTHERAPY ALERT CARD or IMMUNOTHERAPY ALERT CARD  at check-in to the Emergency Department and triage nurse.  Should you have questions after your visit or need to cancel or reschedule your appointment, please contact [facility]  Dept: [phone]  and follow the prompts.  Office hours are 8:00 a.m. to 4:30 p.m. Monday - Friday. Please note that voicemails left after 4:00 p.m. may not be returned until the following business day.  We are closed weekends and major holidays. You have access to a nurse at all times for urgent questions. Please call the main number to the clinic Dept: [phone] and follow the prompts.   For any non-urgent questions, you may also contact your provider using MyChart. We now offer e-Visits for anyone 5 and older to request care online for non-urgent symptoms. For details visit [email or web address].   Also download the MyChart app! Go to the app store, search "MyChart", open the app, select [facility], and log in with your MyChart username and password.  Due to Covid, a mask is required upon entering the hospital/clinic. If you do not have a mask, one will be given to you upon arrival. For doctor visits, patients may have 1 support person aged 47 or older with them. For treatment visits, patients cannot have anyone with them due to current Covid guidelines and our immunocompromised population.

## 2021-12-15 NOTE — Progress Notes (Addendum)
Ok to treat with ANC 1.4. Proceed with Avastin and Oxaliplatin today per [name].  Raul Del [place], [place], BCPS, BCOP 12/15/2021 1:45 PM

## 2021-12-16 ENCOUNTER — Telehealth: Payer: Self-pay | Admitting: Hematology

## 2021-12-16 ENCOUNTER — Other Ambulatory Visit: Payer: Self-pay | Admitting: Hematology

## 2021-12-16 NOTE — Telephone Encounter (Signed)
Left message with follow-up appointments per 2/9 los.

## 2021-12-20 ENCOUNTER — Other Ambulatory Visit: Payer: Self-pay | Admitting: Hematology

## 2021-12-20 ENCOUNTER — Encounter: Payer: Self-pay | Admitting: Hematology

## 2021-12-20 MED ORDER — OXYCODONE HCL 10 MG PO TABS: 10.0000 mg | ORAL_TABLET | Freq: Three times a day (TID) | ORAL | 0 refills | Status: DC | PRN

## 2021-12-22 ENCOUNTER — Other Ambulatory Visit: Payer: Medicare Other

## 2021-12-29 ENCOUNTER — Other Ambulatory Visit: Payer: Medicare Other

## 2021-12-31 ENCOUNTER — Other Ambulatory Visit: Payer: Self-pay | Admitting: Hematology

## 2022-01-02 ENCOUNTER — Other Ambulatory Visit (HOSPITAL_COMMUNITY): Payer: Self-pay

## 2022-01-04 ENCOUNTER — Other Ambulatory Visit (HOSPITAL_COMMUNITY): Payer: Self-pay

## 2022-01-05 MED FILL — Dexamethasone Sodium Phosphate Inj 100 MG/10ML: INTRAMUSCULAR | Qty: 1 | Status: AC

## 2022-01-06 ENCOUNTER — Inpatient Hospital Stay: Payer: Medicare Other

## 2022-01-06 ENCOUNTER — Encounter: Payer: Self-pay | Admitting: Hematology

## 2022-01-06 ENCOUNTER — Other Ambulatory Visit: Payer: Self-pay

## 2022-01-06 ENCOUNTER — Inpatient Hospital Stay: Payer: Medicare Other | Attending: Physician Assistant

## 2022-01-06 ENCOUNTER — Inpatient Hospital Stay (HOSPITAL_BASED_OUTPATIENT_CLINIC_OR_DEPARTMENT_OTHER): Payer: Medicare Other | Admitting: Hematology

## 2022-01-06 ENCOUNTER — Other Ambulatory Visit (HOSPITAL_COMMUNITY): Payer: Self-pay

## 2022-01-06 VITALS — BP 169/88 | HR 72 | Temp 98.6°F | Resp 18 | Ht 72.0 in | Wt 141.4 lb

## 2022-01-06 VITALS — BP 151/86

## 2022-01-06 DIAGNOSIS — Z5111 Encounter for antineoplastic chemotherapy: Secondary | ICD-10-CM | POA: Diagnosis present

## 2022-01-06 DIAGNOSIS — Z79899 Other long term (current) drug therapy: Secondary | ICD-10-CM | POA: Insufficient documentation

## 2022-01-06 DIAGNOSIS — C775 Secondary and unspecified malignant neoplasm of intrapelvic lymph nodes: Secondary | ICD-10-CM | POA: Diagnosis not present

## 2022-01-06 DIAGNOSIS — C7800 Secondary malignant neoplasm of unspecified lung: Secondary | ICD-10-CM | POA: Diagnosis not present

## 2022-01-06 DIAGNOSIS — C20 Malignant neoplasm of rectum: Secondary | ICD-10-CM | POA: Insufficient documentation

## 2022-01-06 DIAGNOSIS — Z5112 Encounter for antineoplastic immunotherapy: Secondary | ICD-10-CM | POA: Insufficient documentation

## 2022-01-06 DIAGNOSIS — Z5189 Encounter for other specified aftercare: Secondary | ICD-10-CM | POA: Diagnosis not present

## 2022-01-06 DIAGNOSIS — Z95828 Presence of other vascular implants and grafts: Secondary | ICD-10-CM

## 2022-01-06 LAB — CBC WITH DIFFERENTIAL (CANCER CENTER ONLY)
Abs Immature Granulocytes: 0 10*3/uL (ref 0.00–0.07)
Basophils Absolute: 0 10*3/uL (ref 0.0–0.1)
Basophils Relative: 0 %
Eosinophils Absolute: 0 10*3/uL (ref 0.0–0.5)
Eosinophils Relative: 2 %
HCT: 39.6 % (ref 39.0–52.0)
Hemoglobin: 13 g/dL (ref 13.0–17.0)
Immature Granulocytes: 0 %
Lymphocytes Relative: 30 %
Lymphs Abs: 0.7 10*3/uL (ref 0.7–4.0)
MCH: 30.2 pg (ref 26.0–34.0)
MCHC: 32.8 g/dL (ref 30.0–36.0)
MCV: 91.9 fL (ref 80.0–100.0)
Monocytes Absolute: 0.7 10*3/uL (ref 0.1–1.0)
Monocytes Relative: 30 %
Neutro Abs: 0.8 10*3/uL — ABNORMAL LOW (ref 1.7–7.7)
Neutrophils Relative %: 38 %
Platelet Count: 126 10*3/uL — ABNORMAL LOW (ref 150–400)
RBC: 4.31 MIL/uL (ref 4.22–5.81)
RDW: 18.2 % — ABNORMAL HIGH (ref 11.5–15.5)
WBC Count: 2.2 10*3/uL — ABNORMAL LOW (ref 4.0–10.5)
nRBC: 0 % (ref 0.0–0.2)

## 2022-01-06 LAB — CMP (CANCER CENTER ONLY)
ALT: 18 U/L (ref 0–44)
AST: 32 U/L (ref 15–41)
Albumin: 3.4 g/dL — ABNORMAL LOW (ref 3.5–5.0)
Alkaline Phosphatase: 101 U/L (ref 38–126)
Anion gap: 3 — ABNORMAL LOW (ref 5–15)
BUN: 11 mg/dL (ref 8–23)
CO2: 30 mmol/L (ref 22–32)
Calcium: 9.3 mg/dL (ref 8.9–10.3)
Chloride: 102 mmol/L (ref 98–111)
Creatinine: 0.82 mg/dL (ref 0.61–1.24)
GFR, Estimated: >60 mL/min (ref >60)
Glucose, Bld: 107 mg/dL — ABNORMAL HIGH (ref 70–99)
Potassium: 4.4 mmol/L (ref 3.5–5.1)
Sodium: 135 mmol/L (ref 135–145)
Total Bilirubin: 0.6 mg/dL (ref 0.3–1.2)
Total Protein: 6.7 g/dL (ref 6.5–8.1)

## 2022-01-06 LAB — TOTAL PROTEIN, URINE DIPSTICK: Protein, ur: NEGATIVE mg/dL

## 2022-01-06 MED ORDER — OXALIPLATIN CHEMO INJECTION 100 MG/20ML
100.0000 mg/m2 | Freq: Once | INTRAVENOUS | Status: AC
  Administered 2022-01-06: 175 mg via INTRAVENOUS
  Filled 2022-01-06: qty 35

## 2022-01-06 MED ORDER — CAPECITABINE 500 MG PO TABS
ORAL_TABLET | ORAL | 1 refills | Status: DC
Start: 2022-01-06 — End: 2022-03-02
  Filled 2022-01-06: qty 70, 21d supply, fill #0

## 2022-01-06 MED ORDER — SODIUM CHLORIDE 0.9 % IV SOLN: Freq: Once | INTRAVENOUS | Status: AC

## 2022-01-06 MED ORDER — SODIUM CHLORIDE 0.9% FLUSH
10.0000 mL | Freq: Once | INTRAVENOUS | Status: AC
  Administered 2022-01-06: 10 mL

## 2022-01-06 MED ORDER — SODIUM CHLORIDE 0.9 % IV SOLN
7.5000 mg/kg | Freq: Once | INTRAVENOUS | Status: AC
  Administered 2022-01-06: 500 mg via INTRAVENOUS
  Filled 2022-01-06: qty 16

## 2022-01-06 MED ORDER — SODIUM CHLORIDE 0.9 % IV SOLN
10.0000 mg | Freq: Once | INTRAVENOUS | Status: AC
  Administered 2022-01-06: 10 mg via INTRAVENOUS
  Filled 2022-01-06: qty 10

## 2022-01-06 MED ORDER — PALONOSETRON HCL INJECTION 0.25 MG/5ML
0.2500 mg | Freq: Once | INTRAVENOUS | Status: AC
  Administered 2022-01-06: 0.25 mg via INTRAVENOUS
  Filled 2022-01-06: qty 5

## 2022-01-06 MED ORDER — HEPARIN SOD (PORK) LOCK FLUSH 100 UNIT/ML IV SOLN
500.0000 [IU] | Freq: Once | INTRAVENOUS | Status: AC | PRN
  Administered 2022-01-06: 500 [IU]

## 2022-01-06 MED ORDER — SODIUM CHLORIDE 0.9% FLUSH
10.0000 mL | INTRAVENOUS | Status: DC | PRN
  Administered 2022-01-06: 10 mL

## 2022-01-06 MED ORDER — DEXTROSE 5 % IV SOLN: Freq: Once | INTRAVENOUS | Status: AC

## 2022-01-06 NOTE — Patient Instructions (Signed)
[facility]  Discharge Instructions: ?Thank you for choosing [facility] to provide your oncology and hematology care.  ? ?If you have a lab appointment with the [facility], please go directly to the [facility] and check in at the registration area. ?  ?Wear comfortable clothing and clothing appropriate for easy access to any Portacath or PICC line.  ? ?We strive to give you quality time with your provider. You may need to reschedule your appointment if you arrive late (15 or more minutes).  Arriving late affects you and other patients whose appointments are after yours.  Also, if you miss three or more appointments without notifying the office, you may be dismissed from the clinic at the provider?s discretion.    ?  ?For prescription refill requests, have your pharmacy contact our office and allow 72 hours for refills to be completed.   ? ?Today you received the following chemotherapy and/or immunotherapy agents: Bevacizumab, Oxaliplatin.    ?  ?To help prevent nausea and vomiting after your treatment, we encourage you to take your nausea medication as directed. ? ?BELOW ARE SYMPTOMS THAT SHOULD BE REPORTED IMMEDIATELY: ?*FEVER GREATER THAN 100.4 F (38 ?C) OR HIGHER ?*CHILLS OR SWEATING ?*NAUSEA AND VOMITING THAT IS NOT CONTROLLED WITH YOUR NAUSEA MEDICATION ?*UNUSUAL SHORTNESS OF BREATH ?*UNUSUAL BRUISING OR BLEEDING ?*URINARY PROBLEMS (pain or burning when urinating, or frequent urination) ?*BOWEL PROBLEMS (unusual diarrhea, constipation, pain near the anus) ?TENDERNESS IN MOUTH AND THROAT WITH OR WITHOUT PRESENCE OF ULCERS (sore throat, sores in mouth, or a toothache) ?UNUSUAL RASH, SWELLING OR PAIN  ?UNUSUAL VAGINAL DISCHARGE OR ITCHING  ? ?Items with * indicate a potential emergency and should be followed up as soon as possible or go to the Emergency Department if any problems should occur. ? ?Please show the CHEMOTHERAPY ALERT CARD or IMMUNOTHERAPY ALERT CARD  at check-in to the Emergency Department and triage nurse. ? ?Should you have questions after your visit or need to cancel or reschedule your appointment, please contact [facility]  Dept: [phone]  and follow the prompts.  Office hours are 8:00 a.m. to 4:30 p.m. Monday - Friday. Please note that voicemails left after 4:00 p.m. may not be returned until the following business day.  We are closed weekends and major holidays. You have access to a nurse at all times for urgent questions. Please call the main number to the clinic Dept: [phone] and follow the prompts. ? ? ?For any non-urgent questions, you may also contact your provider using MyChart. We now offer e-Visits for anyone 71 and older to request care online for non-urgent symptoms. For details visit [email or web address]. ?  ?Also download the MyChart app! Go to the app store, search "MyChart", open the app, select [facility], and log in with your MyChart username and password. ? ?Due to Covid, a mask is required upon entering the hospital/clinic. If you do not have a mask, one will be given to you upon arrival. For doctor visits, patients may have 1 support person aged 71 or older with them. For treatment visits, patients cannot have anyone with them due to current Covid guidelines and our immunocompromised population.  ? ?

## 2022-01-06 NOTE — Progress Notes (Signed)
Jorge [name]   [phone] Fax:([phone]) [phone]   Clinic Follow up Note   Patient Care Team: Pcp, No as PCP - General  Date of Service:  01/06/2022  CHIEF COMPLAINT: f/u of metastatic rectal cancer  CURRENT THERAPY:  First line chemo CAPOX every 3 weeks, started on 10/14/21, beva added from cycle 2  -Xeloda dose: 1000 mg a.m. and 1500 mg p.m. for 14 days  ASSESSMENT & PLAN:  Jorge [name] is a 71 y.o. male with   1. 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 CT AP 07/23/21 showed rectal mass with no significant adenopathy or distant metastasis. Colonoscopy 08/19/21 showed a 7 cm infiltrative mass in the mid rectum (6-13cm from anal verge), pathology showed adenocarcinoma. MMR is normal -staging pelvic MRI 08/27/21 showed: staging at T4b; N2 likely associated with early extra mesorectal lymph node involvement; distance from tumor to internal anal sphincter is 1.2 cm; also with extramural venous involvement. He has biopsy proven lung mets -baseline CEA 571.84 on 09/06/21. -he received concurrent chemoRT with Xeloda 11/1-11/21/22 under the care of Dr. [name].  He responded clinically, no recurrent rectal bleeding, his pain has improved  -He began first-line CAPOX and bevacizumab on 10/14/21, taking Xeloda 1500 mg BID 2 weeks on/1 week off. He tolerated very well overall.  -Cycle 2 CAPOX delayed due to elevated liver enzymes, resumed with dose-reduced Xeloda upon improvement, takes Xeloda at 1000 mg a.m. and 1500 mg p.m. for 14 days. He is tolerating this dose well. -he notes his Xeloda treatment dates got off because he took it for too long one cycle. I advised him to take an extra week off this cycle to try to get it back in sync with his oxali infusions. -labs reviewed, anemia improved but plt count down to 126K and WBC to 2.2. Urine protein negative. I will reduce his dose today because  of his low counts. -f/u in 3 weeks    2. Mild anemia from rectal bleeding, probable iron deficiency -He has started oral iron, tolerating well.  We will continue. -hgb overall stable, mild anemia at 11-12.8 range -hgb is just WNL at 13 today (01/06/22)   3. Multiple colon polyps -He has a large 3.5 cm polyps in the hepatic flexure, biopsy showed tubular adenoma. He is scheduled for f/u with Dr. [name] on 12/13/21.  They discussed polypectomy/resection to remove it -In the context of his stage IV rectal cancer, he prefers to defer resection for now, which we agree -He had additional 4 small polyps which were removed, path showed tubular adenomas.    PLAN: -proceed with oxali today, at slightly reduced dose due to cytopenia  -he started this cycle Xeloda on 2/21, continue Xeloda, he will finish this cycle on Monday, 3/6  -I advised him to take an extra week off to sync his Xeloda and oxali back up. -f/u and oxali in 3 weeks, with lab/flush and CT several days before  -Thursday is preferable for pt's daughter.   No problem-specific Assessment & Plan notes found for this encounter.   SUMMARY OF ONCOLOGIC HISTORY: Oncology History Overview Note  Cancer Staging Rectal cancer [facility]) Staging form: Colon and Rectum, AJCC 8th Edition - Clinical stage from 08/29/2021: [name], cM1 - Signed by [name], MD on 08/29/2021    Rectal cancer ([place])  07/23/2021 Imaging   CT AP  IMPRESSION: 1. Appearance of the rectum likely indicates a rectal mass  suspicious for rectal carcinoma. Inflammatory process would be less likely. Direct visualization is suggested. 2. Cholelithiasis without evidence of acute cholecystitis. 3. Aortic atherosclerosis.   08/19/2021 Procedure   Colonoscopy, under Dr. [name]  Impression: - Rectal tenderness, palpable rectal mass and hemorrhoids found on digital rectal exam. - Stool in the entire examined colon - lavaged with still inadequate clearance. - One 35  mm polyp at the hepatic flexure. Biopsied. Tattooed distal in case future endoscopic resection is considered - however, has larger issues with rectal mass currently as below. - Four, 5 to 11 mm polyps in the transverse colon and at the hepatic flexure, removed with a cold snare. Resected and retrieved. - Diverticulosis in the recto-sigmoid colon and in the sigmoid colon. - Rule out malignancy, partially obstructing tumor in the rectum and from 6 to 13 cm proximal to the anus. Biopsied.   08/19/2021 Pathology Results   Diagnosis 1. Hepatic Flexure Biopsy - TUBULAR ADENOMA WITHOUT HIGH-GRADE DYSPLASIA OR MALIGNANCY 2. Transverse Colon Biopsy, and hepatic flexure, polyps (4) - TUBULAR ADENOMA WITHOUT HIGH-GRADE DYSPLASIA OR MALIGNANCY - OTHER FRAGMENTS OF POLYPOID COLONIC MUCOSA WITH NO SPECIFIC HISTOPATHOLOGIC CHANGES - FOOD MATERIAL 3. Rectum, biopsy - ADENOCARCINOMA. SEE NOTE   08/22/2021 Initial Diagnosis   Rectal cancer ([place])   08/26/2021 Imaging   IMPRESSION: 1. A 2.2 x 1.9 cm right middle lobe pulmonary nodule as well as a total of four right upper lobe pulmonary nodule and masses measuring up to 3.6 cm. Findings concerning for metastatic primary lung cancer versus less likely metastases in a patient with rectal cancer. Additional imaging evaluation or consultation with Pulmonology or Thoracic Surgery recommended. 2. No gross hilar adenopathy, noting limited sensitivity for the detection of hilar adenopathy on this noncontrast study. 3. Cholelithiasis. 4.  Emphysema (ICD10-J43.9). 5. At least left anterior descending coronary artery calcifications.   08/27/2021 Imaging   IMPRESSION: Rectal adenocarcinoma T stage: T4 B   Rectal adenocarcinoma N stage: N2 disease likely associated with early extra mesorectal lymph node involvement.   Distance from tumor to the internal anal sphincter is 1.2 cm.   Also with extramural venous involvement as described.   08/29/2021 Cancer  Staging   Staging form: Colon and Rectum, AJCC 8th Edition - Clinical stage from 08/29/2021: [name], cM1 - Signed by [name], MD on 08/29/2021 Stage prefix: Initial diagnosis    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[place])  10/06/2021 Initial Diagnosis   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[place])   10/14/2021 -  Chemotherapy   Patient is on Treatment Plan : COLORECTAL CapeOx + Bevacizumab q21d        INTERVAL HISTORY:  Jorge [name] is here for a follow up of metastatic rectal cancer. He was last seen by NP Lacie on 12/15/21. He presents to the clinic accompanied by his daughter. He reports he is doing well overall. He denies any further rectal pain or discomfort, bowel disturbances, or stomach issues. He notes some fatigue but adds he works to stay active. He does report cold sensitivity after infusion, as expected, which resolves in a few days.   All other systems were reviewed with the patient and are negative.  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 ([place]) 08/19/2021    SURGICAL HISTORY: Past Surgical History:  Procedure Laterality Date   BRONCHIAL BIOPSY  09/27/2021   Procedure: BRONCHIAL BIOPSIES;  Surgeon: Garner Nash, DO;  Location:  [place] [facility];  Service: Pulmonary;;   BRONCHIAL BRUSHINGS  09/27/2021   Procedure: BRONCHIAL BRUSHINGS;  Surgeon: Garner Nash, DO;  Location: [place] [facility];  Service: Pulmonary;;   BRONCHIAL NEEDLE ASPIRATION BIOPSY  09/27/2021   Procedure: BRONCHIAL NEEDLE ASPIRATION BIOPSIES;  Surgeon: Garner Nash, DO;  Location: [place] [facility];  Service: Pulmonary;;   COLONOSCOPY     IR IMAGING GUIDED PORT INSERTION  10/24/2021   left breast surgery Left 1968   VIDEO BRONCHOSCOPY WITH RADIAL ENDOBRONCHIAL ULTRASOUND  09/27/2021   Procedure: RADIAL ENDOBRONCHIAL ULTRASOUND;  Surgeon: Garner Nash, DO;  Location: [place] [facility];  Service:  Pulmonary;;    I have reviewed the social history and family history with the patient and they are unchanged from previous note.  ALLERGIES:  has No Known Allergies.  MEDICATIONS:  Current Outpatient Medications  Medication Sig Dispense Refill   capecitabine (XELODA) 500 MG tablet Take 2 tabs in morning and 3 tabs in evening after meals.Take on day 1-14 every 21 days. 70 tablet 1   carbonyl iron (FEOSOL) 45 MG TABS tablet Take 45 mg by mouth daily.     dexamethasone (DECADRON) 4 MG tablet Take 2 tablets (8 mg total) by mouth daily. Start the day after chemotherapy for 2 days. Take with food. 30 tablet 1   lidocaine-prilocaine (EMLA) cream Apply 1 application topically as needed. 30 g 2   LORazepam (ATIVAN) 0.5 MG tablet Take 1 tablet (0.5 mg total) by mouth every 6 (six) hours as needed (Nausea or vomiting). 30 tablet 0   morphine (MS CONTIN) 15 MG 12 hr tablet Take 1 tablet (15 mg total) by mouth every 12 (twelve) hours. 60 tablet 0   Multiple Vitamin (MULTIVITAMIN WITH MINERALS) TABS tablet Take 1 tablet by mouth daily.     ondansetron (ZOFRAN) 8 MG tablet Take 1 tablet (8 mg total) by mouth every 8 (eight) hours as needed for nausea or vomiting. 30 tablet 0   Oxycodone HCl 10 MG TABS Take 1 tablet (10 mg total) by mouth every 8 (eight) hours as needed. 30 tablet 0   prochlorperazine (COMPAZINE) 10 MG tablet Take 1 tablet (10 mg total) by mouth every 6 (six) hours as needed (Nausea or vomiting). 30 tablet 1   No current facility-administered medications for this visit.    PHYSICAL EXAMINATION: ECOG PERFORMANCE STATUS: 1 - Symptomatic but completely ambulatory  Vitals:   01/06/22 1041  BP: (!) 169/88  Pulse: 72  Resp: 18  Temp: 98.6 F (37 C)  SpO2: 100%   Wt Readings from Last 3 Encounters:  01/06/22 141 lb 6.4 oz (64.1 kg)  12/15/21 138 lb 8 oz (62.8 kg)  12/13/21 137 lb 4 oz (62.3 kg)     GENERAL:alert, no distress and comfortable SKIN: skin color normal, no rashes or  significant lesions EYES: normal, Conjunctiva are pink and non-injected, sclera clear  NEURO: alert & oriented x 3 with fluent speech  LABORATORY DATA:  I have reviewed the data as listed CBC Latest Ref Rng & Units 01/06/2022 12/15/2021 11/24/2021  WBC 4.0 - 10.5 K/uL 2.2(L) 3.2(L) 3.4(L)  Hemoglobin 13.0 - 17.0 g/dL 13.0 12.1(L) 12.4(L)  Hematocrit 39.0 - 52.0 % 39.6 37.4(L) 38.2(L)  Platelets 150 - 400 K/uL 126(L) 215 201     CMP Latest Ref Rng & Units 01/06/2022 12/15/2021 11/24/2021  Glucose 70 - 99 mg/dL 107(H) 96 97  BUN 8 - 23 mg/dL '11 9 10  ' Creatinine 0.61 - 1.24 mg/dL 0.82 0.76 0.73  Sodium 135 - 145 mmol/L 135 135 135  Potassium 3.5 - 5.1 mmol/L 4.4 4.5 4.3  Chloride 98 - 111 mmol/L 102 102 104  CO2 22 - 32 mmol/L '30 31 29  ' Calcium 8.9 - 10.3 mg/dL 9.3 8.9 9.0  Total Protein 6.5 - 8.1 g/dL 6.7 7.1 7.8  Total Bilirubin 0.3 - 1.2 mg/dL 0.6 0.6 0.5  Alkaline Phos 38 - 126 U/L 101 95 113  AST 15 - 41 U/L 32 32 56(H)  ALT 0 - 44 U/L 18 19 58(H)      RADIOGRAPHIC STUDIES: I have personally reviewed the radiological images as listed and agreed with the findings in the report. No results found.    Orders Placed This Encounter  Procedures   CT CHEST ABDOMEN PELVIS W CONTRAST    Standing Status:   Future    Standing Expiration Date:   01/07/2023    Order Specific Question:   Preferred imaging location?    Answer:   [facility]    Order Specific Question:   Release to patient    Answer:   Immediate    Order Specific Question:   Is Oral Contrast requested for this exam?    Answer:   Yes, Per Radiology protocol   All questions were answered. The patient knows to call the clinic with any problems, questions or concerns. No barriers to learning was detected. The total time spent in the appointment was 30 minutes.     [name], MD 01/06/2022   I, Wilburn Mylar, am acting as scribe for [name], MD.   I have reviewed the above documentation for accuracy and  completeness, and I agree with the above.

## 2022-01-09 ENCOUNTER — Telehealth: Payer: Self-pay | Admitting: Hematology

## 2022-01-09 NOTE — Telephone Encounter (Signed)
Left message with follow-up appointment per 3/3 los. ?

## 2022-01-16 ENCOUNTER — Other Ambulatory Visit: Payer: Self-pay | Admitting: Oncology

## 2022-01-16 ENCOUNTER — Other Ambulatory Visit: Payer: Self-pay | Admitting: Hematology

## 2022-01-16 ENCOUNTER — Other Ambulatory Visit (HOSPITAL_COMMUNITY): Payer: Self-pay

## 2022-01-18 ENCOUNTER — Encounter: Payer: Self-pay | Admitting: Hematology

## 2022-01-18 MED ORDER — OXYCODONE HCL 10 MG PO TABS: 10.0000 mg | ORAL_TABLET | Freq: Three times a day (TID) | ORAL | 0 refills | Status: DC | PRN

## 2022-01-18 MED ORDER — MORPHINE SULFATE ER 15 MG PO TBCR: 15.0000 mg | EXTENDED_RELEASE_TABLET | Freq: Two times a day (BID) | ORAL | 0 refills | Status: DC

## 2022-01-23 ENCOUNTER — Other Ambulatory Visit: Payer: Self-pay

## 2022-01-23 ENCOUNTER — Ambulatory Visit (HOSPITAL_COMMUNITY)
Admission: RE | Admit: 2022-01-23 | Discharge: 2022-01-23 | Disposition: A | Discharge status: Home / Self Care | Payer: Medicare Other | Source: Ambulatory Visit | Attending: Hematology | Admitting: Hematology

## 2022-01-23 DIAGNOSIS — C20 Malignant neoplasm of rectum: Secondary | ICD-10-CM | POA: Diagnosis present

## 2022-01-23 IMAGING — CT CT CHEST-ABD-PELV W/ CM
3 of 5 series · 14 of 36 positions shown, 15 images · IV contrast (OMNIPAQUE)
Comparison: Chest CT [DATE].  Abdomen pelvis CT [DATE].

CLINICAL DATA: Rectal cancer.  Restaging.

EXAM:
CT CHEST, ABDOMEN, AND PELVIS WITH CONTRAST
TECHNIQUE: Multidetector CT imaging of the chest, abdomen and pelvis was
performed following the standard protocol during bolus
administration of intravenous contrast.

[Series 2: cap with · axial · 0.70mm/px · z∈[+954,+1459]mm · 8 of 131 slices shown, 9 images]
[im 15/131  mediastinal]
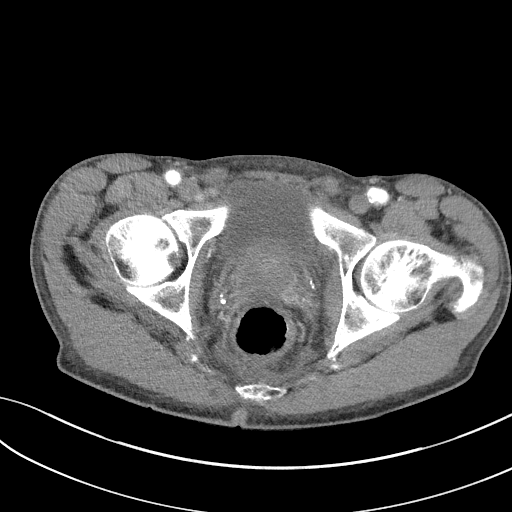
[im 15/131  bone]
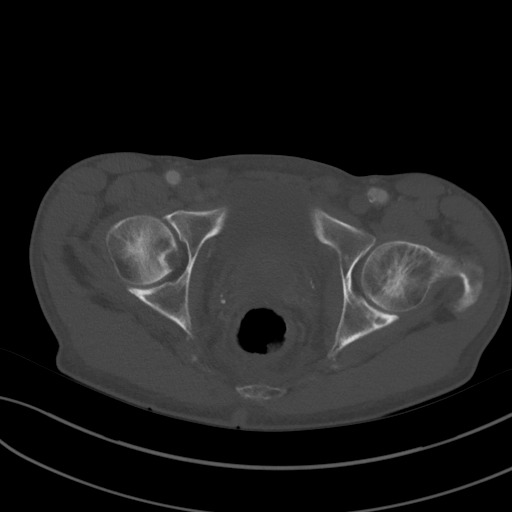
[im 29/131  mediastinal]
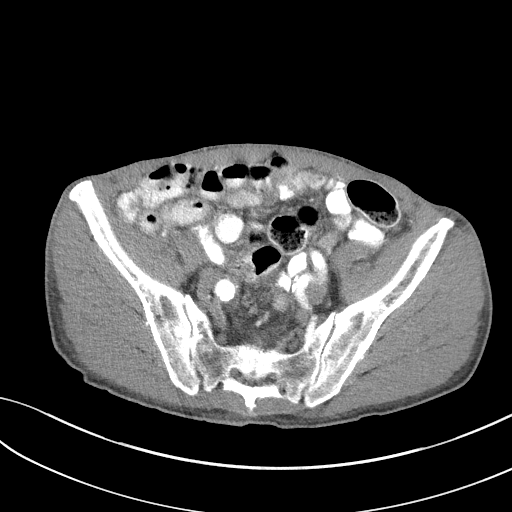
[im 44/131  mediastinal]
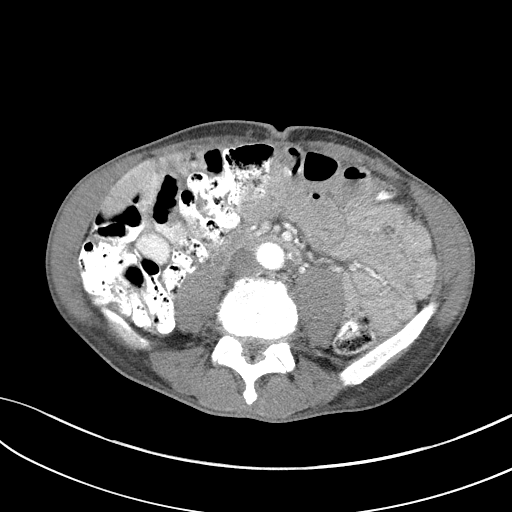
[im 58/131  mediastinal]
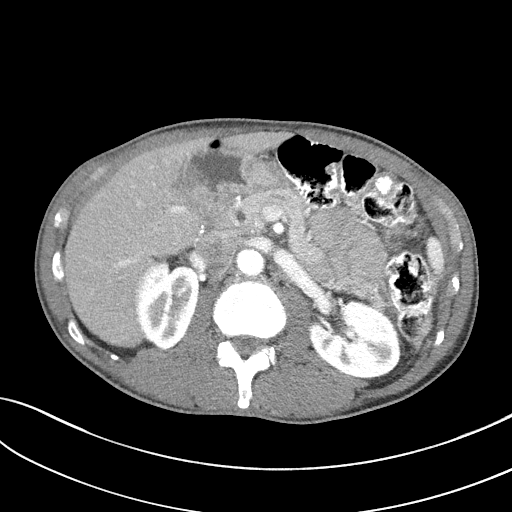
[im 73/131  mediastinal]
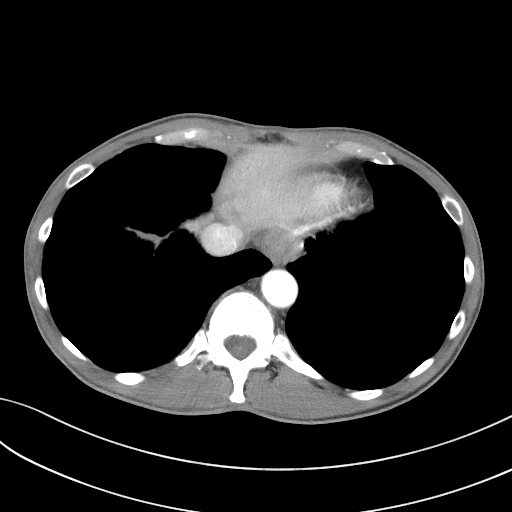
[im 87/131  mediastinal]
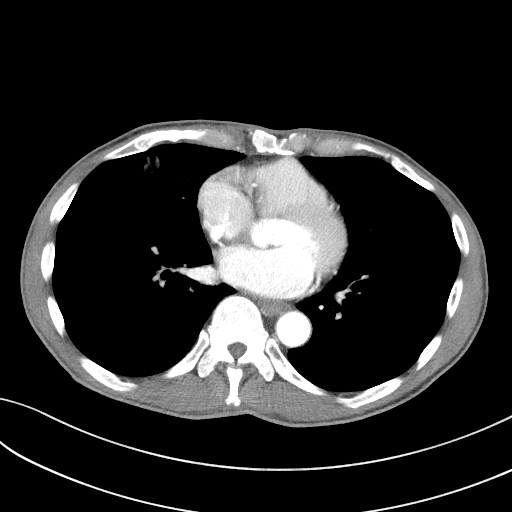
[im 102/131  mediastinal]
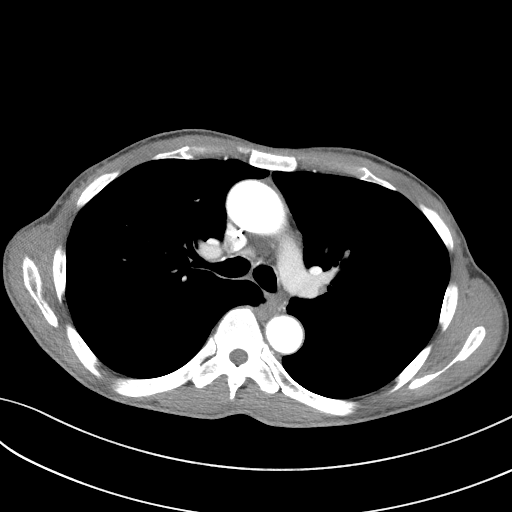
[im 116/131  mediastinal]
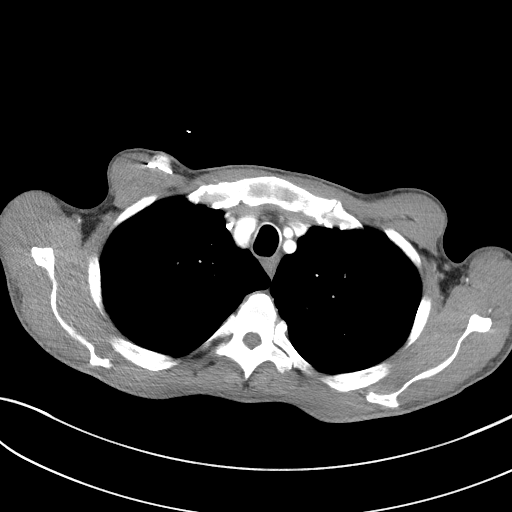

[Series 4: lung · axial · 0.70mm/px · z∈[+1176,+1286]mm · 3 of 193 slices shown]
[im 14/193  bone]
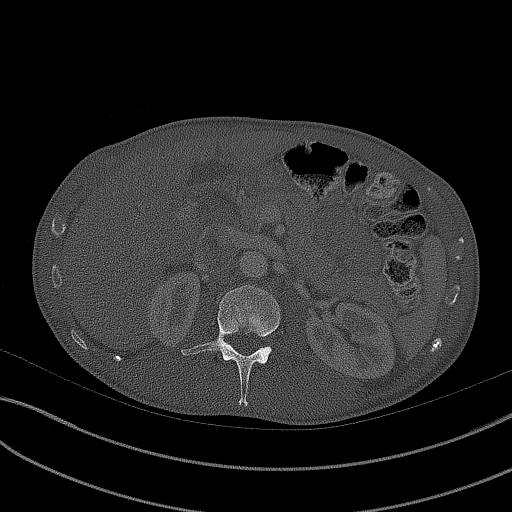
[im 42/193  bone]
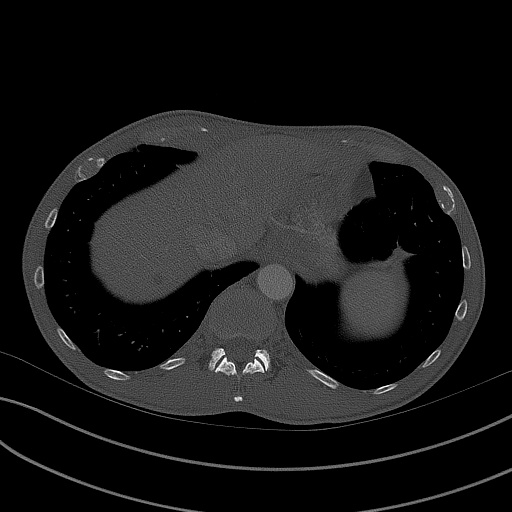
[im 69/193  bone]
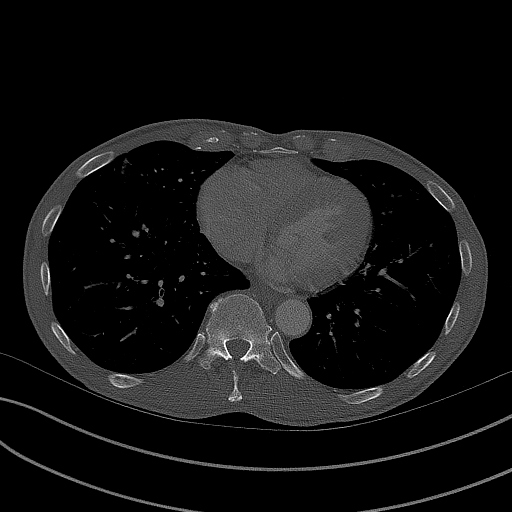

[Series 5: coronals · coronal · 0.81mm/px · 3 of 130 slices shown]
[im 26/130  mediastinal]
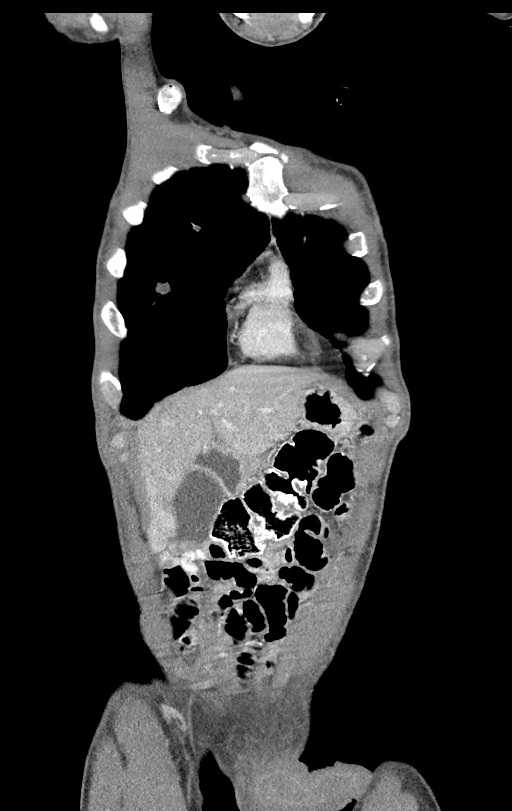
[im 52/130  mediastinal]
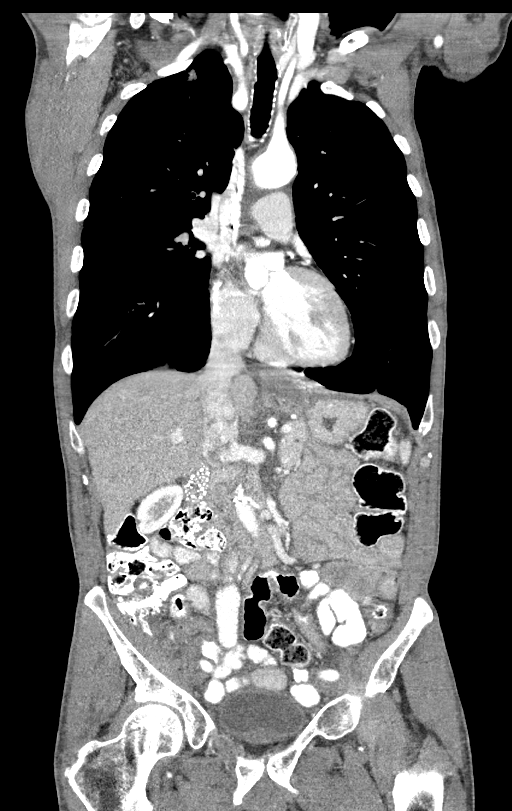
[im 78/130  mediastinal]
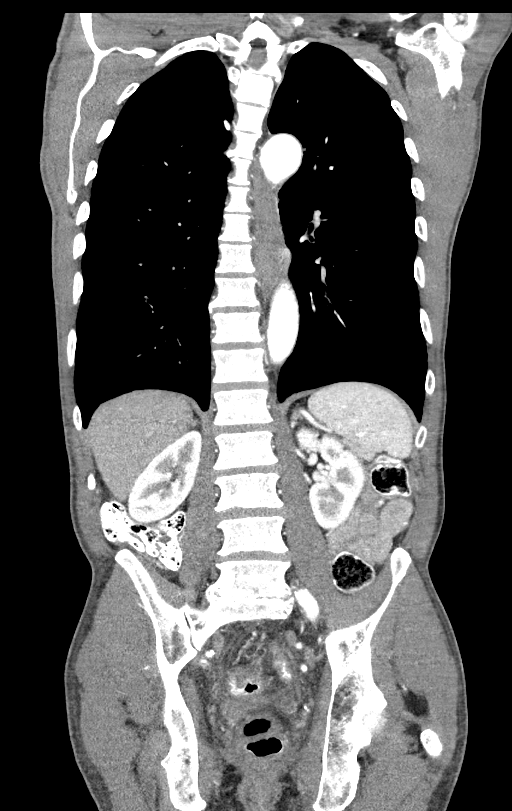

[14 of 36 positions shown; findings below may reference images not displayed]

RADIATION DOSE REDUCTION: This exam was performed according to the
departmental dose-optimization program which includes automated
exposure control, adjustment of the mA and/or kV according to
patient size and/or use of iterative reconstruction technique.

CONTRAST:  100mL OMNIPAQUE IOHEXOL 300 MG/ML  SOLN
FINDINGS: CT CHEST FINDINGS

Cardiovascular: The heart size is normal. No substantial pericardial
effusion. No thoracic aortic aneurysm. No substantial
atherosclerosis of the thoracic aorta. Right Port-A-Cath tip is
positioned at the mid SVC level.

Mediastinum/Nodes: No mediastinal lymphadenopathy. There is no hilar
lymphadenopathy. The esophagus has normal imaging features. There is
no axillary lymphadenopathy.

Lungs/Pleura: Multiple bilateral pulmonary nodules again noted.
Index anterior right upper lobe pulmonary nodule measuring 1.9 cm on
image 65/4 was 1.8 cm previously.

A second right right upper lobe index nodule measured previously at
3.6 x 3.4 cm is now 2.2 x 1.9 cm (87/4).

3 mm right middle lobe pulmonary nodule seen previously has resolved
in the interval.

Index right middle lobe nodule measured previously at 2.2 x 1.9 cm
is now 1.6 x 1.2 cm (113/4).

No suspicious pulmonary nodule or mass in the left lung. No new
suspicious pulmonary nodule or mass on today's study.

Musculoskeletal: No worrisome lytic or sclerotic osseous
abnormality.

CT ABDOMEN PELVIS FINDINGS

Hepatobiliary: Approximally 10 hypoattenuating liver lesions are
present on today's study, new since [DATE].

Index lateral segment left liver lesion measuring 1.0 cm is seen on
68/2, new in the interval.

A second 5 mm low-density ill-defined lesion in the lateral segment
left liver on 69/2 is new since prior.

Index lesion posterior right hepatic dome measuring 1.1 cm on 64/2
is new in the interval.

8 mm low-density lesion in the caudate lobe (67/2) is new since
prior.

Multiple tiny gallstones evident. No intrahepatic or extrahepatic
biliary dilation.

Pancreas: No focal mass lesion. No dilatation of the main duct. No
intraparenchymal cyst. No peripancreatic edema.

Spleen: No splenomegaly. No focal mass lesion.

Adrenals/Urinary Tract: No adrenal nodule or mass. Kidneys
unremarkable. No evidence for hydroureter. The urinary bladder
appears normal for the degree of distention.

Stomach/Bowel: Stomach is unremarkable. No gastric wall thickening.
No evidence of outlet obstruction. Duodenum is normally positioned
as is the ligament of Treitz. No small bowel wall thickening. No
small bowel dilatation. The terminal ileum is normal. The appendix
is normal. Wall thickening again noted in the rectum (114/2).
Perirectal edema is similar to prior.

Vascular/Lymphatic: There is mild atherosclerotic calcification of
the abdominal aorta without aneurysm. There is no gastrohepatic or
hepatoduodenal ligament lymphadenopathy. No retroperitoneal or
mesenteric lymphadenopathy. No pelvic sidewall lymphadenopathy.

Reproductive: The prostate gland is mildly enlarged.

Other: No intraperitoneal free fluid.

Musculoskeletal: No worrisome lytic or sclerotic osseous
abnormality.
IMPRESSION: 1. Mild interval decrease in size of multiple right-sided pulmonary
nodules. No new suspicious pulmonary nodule or mass.
2. Interval development of approximately 10 new ill-defined
hypoattenuating liver lesions ranging in size from approximately
5-10 mm. Imaging features suspicious for metastatic disease. MRI
abdomen with and without contrast may prove helpful to further
evaluate.
3. Similar appearance of wall thickening in the rectum with
perirectal edema.
4. Cholelithiasis.
5. Prostatomegaly.
6. Aortic Atherosclerosis ([HS]-[HS]).

## 2022-01-23 MED ORDER — SODIUM CHLORIDE (PF) 0.9 % IJ SOLN
INTRAMUSCULAR | Status: AC
  Filled 2022-01-23: qty 50

## 2022-01-23 MED ORDER — HEPARIN SOD (PORK) LOCK FLUSH 100 UNIT/ML IV SOLN
INTRAVENOUS | Status: AC
  Filled 2022-01-23: qty 5

## 2022-01-23 MED ORDER — IOHEXOL 300 MG/ML  SOLN
100.0000 mL | Freq: Once | INTRAMUSCULAR | Status: AC | PRN
  Administered 2022-01-23: 100 mL via INTRAVENOUS

## 2022-01-23 MED ORDER — HEPARIN SOD (PORK) LOCK FLUSH 100 UNIT/ML IV SOLN
500.0000 [IU] | Freq: Once | INTRAVENOUS | Status: AC
  Administered 2022-01-23: 500 [IU] via INTRAVENOUS

## 2022-01-24 NOTE — Progress Notes (Signed)
?[place]   ?Telephone:(336) [phone] [phone]   ?Clinic Follow up Note  ? ?Patient Care Team: ?Pcp, No as PCP - General ?01/25/2022 ? ?CHIEF COMPLAINT: Follow up metastatic rectal cancer  ? ?SUMMARY OF ONCOLOGIC HISTORY: ?Oncology History Overview Note  ?Cancer Staging ?Rectal cancer ([place]) ?Staging form: Colon and Rectum, AJCC 8th Edition ?- Clinical stage from 08/29/2021: [name], cM1 - Signed by [name], MD on 08/29/2021 ? ?  ?Rectal cancer ([place])  ?07/23/2021 Imaging  ? CT AP ? ?IMPRESSION: ?1. Appearance of the rectum likely indicates a rectal mass ?suspicious for rectal carcinoma. Inflammatory process would be less likely. Direct visualization is suggested. ?2. Cholelithiasis without evidence of acute cholecystitis. ?3. Aortic atherosclerosis. ?  ?08/19/2021 Procedure  ? Colonoscopy, under Dr. [name] ? ?Impression: ?- Rectal tenderness, palpable rectal mass and hemorrhoids found on digital rectal exam. ?- Stool in the entire examined colon - lavaged with still inadequate clearance. ?- One 35 mm polyp at the hepatic flexure. Biopsied. Tattooed distal in case future endoscopic resection is considered - however, has larger issues with rectal mass currently as below. ?- Four, 5 to 11 mm polyps in the transverse colon and at the hepatic flexure, removed with a cold snare. Resected and retrieved. ?- Diverticulosis in the recto-sigmoid colon and in the sigmoid colon. ?- Rule out malignancy, partially obstructing tumor in the rectum and from 6 to 13 cm proximal to the anus. Biopsied. ?  ?08/19/2021 Pathology Results  ? Diagnosis ?1. Hepatic Flexure Biopsy ?- TUBULAR ADENOMA WITHOUT HIGH-GRADE DYSPLASIA OR MALIGNANCY ?2. Transverse Colon Biopsy, and hepatic flexure, polyps (4) ?- TUBULAR ADENOMA WITHOUT HIGH-GRADE DYSPLASIA OR MALIGNANCY ?- OTHER FRAGMENTS OF POLYPOID COLONIC MUCOSA WITH NO SPECIFIC HISTOPATHOLOGIC CHANGES ?- FOOD MATERIAL ?3. Rectum, biopsy ?- ADENOCARCINOMA. SEE  NOTE ?  ?08/22/2021 Initial Diagnosis  ? Rectal cancer [facility]) ?  ?08/26/2021 Imaging  ? IMPRESSION: ?1. A 2.2 x 1.9 cm right middle lobe pulmonary nodule as well as a ?total of four right upper lobe pulmonary nodule and masses measuring up to 3.6 cm. Findings concerning for metastatic primary lung cancer versus less likely metastases in a patient with rectal cancer. Additional imaging evaluation or consultation with Pulmonology or Thoracic Surgery recommended. ?2. No gross hilar adenopathy, noting limited sensitivity for the ?detection of hilar adenopathy on this noncontrast study. ?3. Cholelithiasis. ?4.  Emphysema (ICD10-J43.9). ?5. At least left anterior descending coronary artery calcifications. ?  ?08/27/2021 Imaging  ? IMPRESSION: ?Rectal adenocarcinoma T stage: T4 B ?  ?Rectal adenocarcinoma N stage: N2 disease likely associated with ?early extra mesorectal lymph node involvement. ?  ?Distance from tumor to the internal anal sphincter is 1.2 cm. ?  ?Also with extramural venous involvement as described. ?  ?08/29/2021 Cancer Staging  ? Staging form: Colon and Rectum, AJCC 8th Edition ?- Clinical stage from 08/29/2021: [name], cM1 - Signed by [name], MD on 08/29/2021 ?Stage prefix: Initial diagnosis ? ?  ?01/23/2022 Imaging  ? CT CAP w contrast IMPRESSION: ?1. Mild interval decrease in size of multiple right-sided pulmonary nodules. No new suspicious pulmonary nodule or mass. ?2. Interval development of approximately 10 new ill-defined hypoattenuating liver lesions ranging in size from approximately 5-10 mm. Imaging features suspicious for metastatic disease. MRI abdomen with and without contrast may prove helpful to further ?evaluate. ?3. Similar appearance of wall thickening in the rectum with perirectal edema. ?4. Cholelithiasis. ?5. Prostatomegaly. ?6. Aortic Atherosclerosis (ICD10-I70.0). ?  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  ?10/14/2021 -  Chemotherapy  ? Patient is on Treatment Plan : COLORECTAL CapeOx + Bevacizumab q21d  ?   ? ? ?CURRENT THERAPY: First line chemo CAPOX every 3 weeks, started on 10/14/21, beva added from cycle 2 ?            -Xeloda dose: 1000 mg a.m. and 1500 mg p.m. for 14 days.  Change to 1 week on/1 week off 01/25/2022 due to neutropenia ? ?INTERVAL HISTORY: [name] returns for follow up as scheduled. Last seen by [name] 01/06/22 and completed another cycle of Beva and Oxali which was dose reduced for cytopenias.  He started a new Xeloda cycle 01/24/2022, takes 2 tabs a.m. and 3 tabs p.m.  He tolerates treatment well, cold sensitivity lasts a few days, denies residual neuropathy in the absence of cold.  He is eating and drinking well, energy level is great.  He is running for exercise.  Bowels moving well without significant rectal pain or any bleeding.  Denies nausea/vomiting, right upper quadrant or other new pain.  Palms are dark without erythema.  Denies new fever, chills, cough, chest pain, dyspnea, leg edema, or any other new complaints.  He underwent restaging CT CAP on 01/23/22.  ? ? ?MEDICAL HISTORY:  ?Past Medical History:  ?Diagnosis Date  ? Anemia   ? 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 ? ?SURGICAL HISTORY: ?Past Surgical History:  ?Procedure Laterality Date  ? BRONCHIAL BIOPSY  09/27/2021  ? Procedure: BRONCHIAL BIOPSIES;  Surgeon: Garner Nash, DO;  Location: [place] [facility];  Service: Pulmonary;;  ? BRONCHIAL BRUSHINGS  09/27/2021  ? Procedure: BRONCHIAL BRUSHINGS;  Surgeon: Garner Nash, DO;  Location: [place] [facility];  Service: Pulmonary;;  ? BRONCHIAL NEEDLE ASPIRATION BIOPSY  09/27/2021  ? Procedure: BRONCHIAL NEEDLE ASPIRATION BIOPSIES;  Surgeon: Garner Nash, DO;  Location: [place] [facility];  Service: Pulmonary;;  ? COLONOSCOPY    ? IR IMAGING GUIDED PORT INSERTION  10/24/2021  ? left breast surgery Left 1968  ? VIDEO  BRONCHOSCOPY WITH RADIAL ENDOBRONCHIAL ULTRASOUND  09/27/2021  ? Procedure: RADIAL ENDOBRONCHIAL ULTRASOUND;  Surgeon: Garner Nash, DO;  Location: [place] [facility];  Service: Pulmonary;;  ? ? ?I have reviewed the social history and family history with the patient and they are unchanged from previous note. ? ?ALLERGIES:  has No Known Allergies. ? ?MEDICATIONS:  ?Current Outpatient Medications  ?Medication Sig Dispense Refill  ? capecitabine (XELODA) 500 MG tablet Take 2 tabs in morning and 3 tabs in evening after meals.Take on day 1-14 every 21 days. 70 tablet 1  ? carbonyl iron (FEOSOL) 45 MG TABS tablet Take 45 mg by mouth daily.    ? dexamethasone (DECADRON) 4 MG tablet Take 2 tablets (8 mg total) by mouth daily. Start the day after chemotherapy for 2 days. Take with food. 30 tablet 1  ? lidocaine-prilocaine (EMLA) cream Apply 1 application topically as needed. 30 g 2  ? LORazepam (ATIVAN) 0.5 MG tablet Take 1 tablet (0.5 mg total) by mouth every 6 (six) hours as needed (Nausea or vomiting). 30 tablet 0  ? morphine (MS CONTIN) 15 MG 12 hr tablet Take 1 tablet (15 mg total) by mouth every 12 (twelve) hours. 60 tablet 0  ? Multiple Vitamin (MULTIVITAMIN WITH MINERALS) TABS tablet Take 1 tablet by mouth daily.    ? ondansetron (ZOFRAN) 8 MG tablet Take 1 tablet (8 mg total)  by mouth every 8 (eight) hours as needed for nausea or vomiting. 30 tablet 0  ? Oxycodone HCl 10 MG TABS Take 1 tablet (10 mg total) by mouth every 8 (eight) hours as needed. 30 tablet 0  ? prochlorperazine (COMPAZINE) 10 MG tablet Take 1 tablet (10 mg total) by mouth every 6 (six) hours as needed (Nausea or vomiting). 30 tablet 1  ? ?No current facility-administered medications for this visit.  ? ?Facility-Administered Medications Ordered in Other Visits  ?Medication Dose Route Frequency Provider Last Rate Last Admin  ? oxaliplatin (ELOXATIN) 175 mg in dextrose 5 % 500 mL chemo infusion  100 mg/m2 (Treatment Plan Recorded) Intravenous Once [name], MD 268 mL/hr at 01/25/22 1228 175 mg at 01/25/22 1228  ? ? ?PHYSICAL EXAMINATION: ?ECOG PERFORMANCE STATUS: 1 - Symptomatic but completely ambulatory ? ?Vitals:  ? 01/25/22 1015  ?BP: (!) 171/88  ?Pulse: (!) 59  ?R

## 2022-01-25 ENCOUNTER — Inpatient Hospital Stay: Payer: Medicare Other

## 2022-01-25 ENCOUNTER — Inpatient Hospital Stay (HOSPITAL_BASED_OUTPATIENT_CLINIC_OR_DEPARTMENT_OTHER): Payer: Medicare Other | Admitting: Nurse Practitioner

## 2022-01-25 ENCOUNTER — Other Ambulatory Visit: Payer: Self-pay

## 2022-01-25 ENCOUNTER — Encounter: Payer: Self-pay | Admitting: Nurse Practitioner

## 2022-01-25 VITALS — BP 171/88 | HR 59 | Temp 97.8°F | Resp 19 | Ht 72.0 in | Wt 145.4 lb

## 2022-01-25 DIAGNOSIS — C20 Malignant neoplasm of rectum: Secondary | ICD-10-CM

## 2022-01-25 DIAGNOSIS — C7800 Secondary malignant neoplasm of unspecified lung: Secondary | ICD-10-CM | POA: Diagnosis not present

## 2022-01-25 DIAGNOSIS — Z5112 Encounter for antineoplastic immunotherapy: Secondary | ICD-10-CM | POA: Diagnosis not present

## 2022-01-25 DIAGNOSIS — Z95828 Presence of other vascular implants and grafts: Secondary | ICD-10-CM

## 2022-01-25 LAB — CMP (CANCER CENTER ONLY)
ALT: 21 U/L (ref 0–44)
AST: 33 U/L (ref 15–41)
Albumin: 3.5 g/dL (ref 3.5–5.0)
Alkaline Phosphatase: 92 U/L (ref 38–126)
Anion gap: 3 — ABNORMAL LOW (ref 5–15)
BUN: 12 mg/dL (ref 8–23)
CO2: 28 mmol/L (ref 22–32)
Calcium: 9.1 mg/dL (ref 8.9–10.3)
Chloride: 106 mmol/L (ref 98–111)
Creatinine: 0.82 mg/dL (ref 0.61–1.24)
GFR, Estimated: >60 mL/min (ref >60)
Glucose, Bld: 95 mg/dL (ref 70–99)
Potassium: 4.5 mmol/L (ref 3.5–5.1)
Sodium: 137 mmol/L (ref 135–145)
Total Bilirubin: 0.5 mg/dL (ref 0.3–1.2)
Total Protein: 6.7 g/dL (ref 6.5–8.1)

## 2022-01-25 LAB — TOTAL PROTEIN, URINE DIPSTICK: Protein, ur: NEGATIVE mg/dL

## 2022-01-25 LAB — CBC WITH DIFFERENTIAL (CANCER CENTER ONLY)
Abs Immature Granulocytes: 0 10*3/uL (ref 0.00–0.07)
Basophils Absolute: 0 10*3/uL (ref 0.0–0.1)
Basophils Relative: 1 %
Eosinophils Absolute: 0 10*3/uL (ref 0.0–0.5)
Eosinophils Relative: 1 %
HCT: 38.7 % — ABNORMAL LOW (ref 39.0–52.0)
Hemoglobin: 12.6 g/dL — ABNORMAL LOW (ref 13.0–17.0)
Immature Granulocytes: 0 %
Lymphocytes Relative: 31 %
Lymphs Abs: 0.6 10*3/uL — ABNORMAL LOW (ref 0.7–4.0)
MCH: 30.3 pg (ref 26.0–34.0)
MCHC: 32.6 g/dL (ref 30.0–36.0)
MCV: 93 fL (ref 80.0–100.0)
Monocytes Absolute: 0.6 10*3/uL (ref 0.1–1.0)
Monocytes Relative: 29 %
Neutro Abs: 0.8 10*3/uL — ABNORMAL LOW (ref 1.7–7.7)
Neutrophils Relative %: 38 %
Platelet Count: 141 10*3/uL — ABNORMAL LOW (ref 150–400)
RBC: 4.16 MIL/uL — ABNORMAL LOW (ref 4.22–5.81)
RDW: 16.5 % — ABNORMAL HIGH (ref 11.5–15.5)
WBC Count: 2 10*3/uL — ABNORMAL LOW (ref 4.0–10.5)
nRBC: 0 % (ref 0.0–0.2)

## 2022-01-25 LAB — CEA (IN HOUSE-CHCC): CEA (CHCC-In House): 30.22 ng/mL — ABNORMAL HIGH (ref 0.00–5.00)

## 2022-01-25 MED ORDER — OXALIPLATIN CHEMO INJECTION 100 MG/20ML
100.0000 mg/m2 | Freq: Once | INTRAVENOUS | Status: AC
  Administered 2022-01-25: 175 mg via INTRAVENOUS
  Filled 2022-01-25: qty 35

## 2022-01-25 MED ORDER — DEXTROSE 5 % IV SOLN: Freq: Once | INTRAVENOUS | Status: AC

## 2022-01-25 MED ORDER — SODIUM CHLORIDE 0.9 % IV SOLN
10.0000 mg | Freq: Once | INTRAVENOUS | Status: AC
  Administered 2022-01-25: 10 mg via INTRAVENOUS
  Filled 2022-01-25: qty 10

## 2022-01-25 MED ORDER — SODIUM CHLORIDE 0.9 % IV SOLN
7.5000 mg/kg | Freq: Once | INTRAVENOUS | Status: AC
  Administered 2022-01-25: 500 mg via INTRAVENOUS
  Filled 2022-01-25: qty 16

## 2022-01-25 MED ORDER — SODIUM CHLORIDE 0.9 % IV SOLN: Freq: Once | INTRAVENOUS | Status: AC

## 2022-01-25 MED ORDER — PALONOSETRON HCL INJECTION 0.25 MG/5ML
0.2500 mg | Freq: Once | INTRAVENOUS | Status: AC
  Administered 2022-01-25: 0.25 mg via INTRAVENOUS
  Filled 2022-01-25: qty 5

## 2022-01-25 MED ORDER — SODIUM CHLORIDE 0.9% FLUSH
10.0000 mL | Freq: Once | INTRAVENOUS | Status: AC
  Administered 2022-01-25: 10 mL

## 2022-01-25 NOTE — Progress Notes (Signed)
Ok to treat today with elevated bp and anc of 0.8 per provider.  ?

## 2022-01-31 ENCOUNTER — Encounter: Payer: Self-pay | Admitting: Hematology

## 2022-02-02 ENCOUNTER — Other Ambulatory Visit (HOSPITAL_COMMUNITY): Payer: Self-pay

## 2022-02-06 ENCOUNTER — Other Ambulatory Visit (HOSPITAL_COMMUNITY): Payer: Self-pay

## 2022-02-07 ENCOUNTER — Encounter: Payer: Self-pay | Admitting: Hematology

## 2022-02-07 ENCOUNTER — Ambulatory Visit (HOSPITAL_COMMUNITY): Admission: RE | Admit: 2022-02-07 | Payer: Medicare Other | Source: Ambulatory Visit

## 2022-02-08 ENCOUNTER — Other Ambulatory Visit (HOSPITAL_COMMUNITY): Payer: Self-pay

## 2022-02-09 ENCOUNTER — Other Ambulatory Visit (HOSPITAL_COMMUNITY): Payer: Self-pay

## 2022-02-09 ENCOUNTER — Other Ambulatory Visit: Payer: Self-pay | Admitting: Hematology

## 2022-02-09 MED ORDER — OXYCODONE HCL 10 MG PO TABS
10.0000 mg | ORAL_TABLET | Freq: Three times a day (TID) | ORAL | 0 refills | Status: DC | PRN
Start: 2022-02-09 — End: 2022-02-17

## 2022-02-10 ENCOUNTER — Ambulatory Visit (HOSPITAL_COMMUNITY)
Admission: RE | Admit: 2022-02-10 | Discharge: 2022-02-10 | Disposition: A | Discharge status: Home / Self Care | Payer: Medicare Other | Source: Ambulatory Visit | Attending: Nurse Practitioner | Admitting: Nurse Practitioner

## 2022-02-10 DIAGNOSIS — C7800 Secondary malignant neoplasm of unspecified lung: Secondary | ICD-10-CM | POA: Diagnosis present

## 2022-02-10 DIAGNOSIS — C20 Malignant neoplasm of rectum: Secondary | ICD-10-CM | POA: Insufficient documentation

## 2022-02-10 IMAGING — MR MR ABDOMEN WO/W CM
18 series · 48 of 48 positions shown · IV contrast (GADAVIST)
Comparison: No prior abdominal MRI. CT the chest, abdomen and
pelvis [DATE].

CLINICAL DATA: 70-year-old male with history of metastatic rectal
cancer. New liver lesions noted on recent CT examination. Follow-up
study.

EXAM:
MRI ABDOMEN WITHOUT AND WITH CONTRAST
TECHNIQUE: Multiplanar multisequence MR imaging of the abdomen was performed
both before and after the administration of intravenous contrast.
CONTRAST:  7mL GADAVIST GADOBUTROL 1 MMOL/ML IV SOLN

[Series 3: T2 fat-sat · axial · 6.0mm · 1.14mm/px · z∈[-121,+131]mm · 2 of 36 slices shown]
[im 1/36]
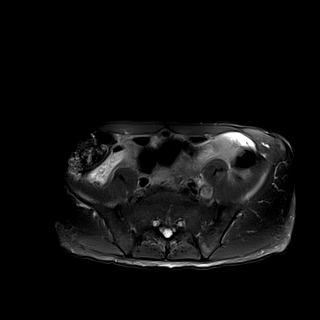
[im 36/36]
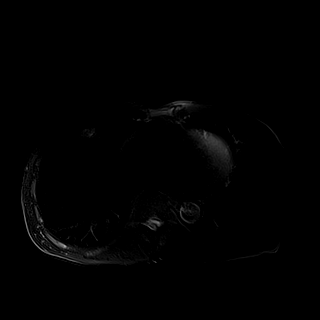

[Series 5: T2 · coronal · 6.0mm · 1.41mm/px · 2 of 30 slices shown (1 of 2)]
[im 1/30]
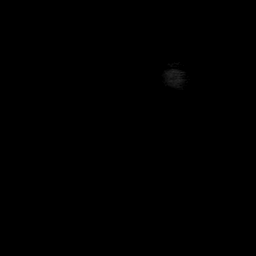
[im 30/30]
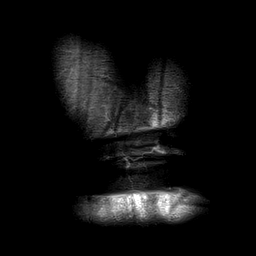

[Series 6: DWI · axial · 6.0mm · 1.36mm/px · z∈[-148,+136]mm · 4 of 72 slices shown (1 of 2)]
[im 1/72]
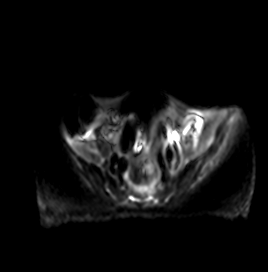
[im 24/72]
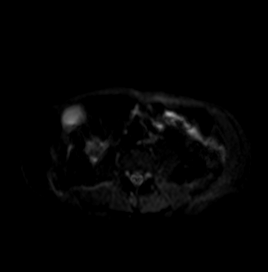
[im 48/72]
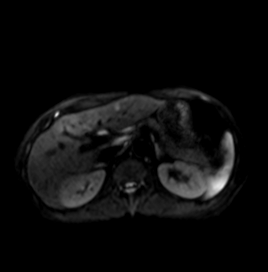
[im 72/72]
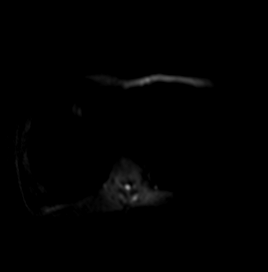

[Series 7: DWI · axial · 6.0mm · 1.36mm/px · z∈[-148,+136]mm · 2 of 36 slices shown (2 of 2)]
[im 1/36]
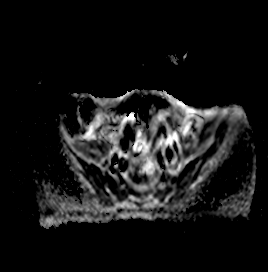
[im 36/36]
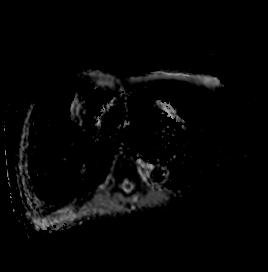

[Series 8: T1 · axial · 3.0mm · 1.12mm/px · z∈[-130,+107]mm · 3 of 80 slices shown (1 of 2)]
[im 1/80]
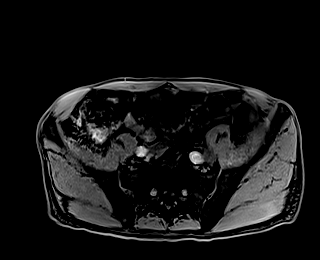
[im 40/80]
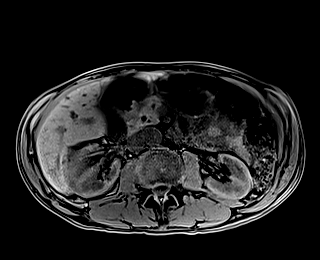
[im 80/80]
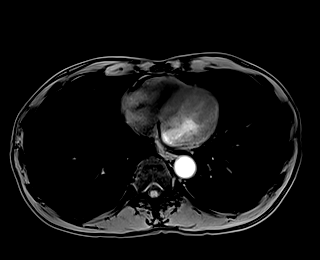

[Series 9: T1 · axial · 3.0mm · 1.12mm/px · z∈[-130,+107]mm · 3 of 80 slices shown (2 of 2)]
[im 1/80]
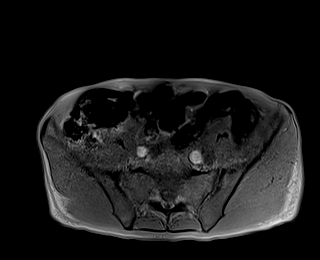
[im 40/80]
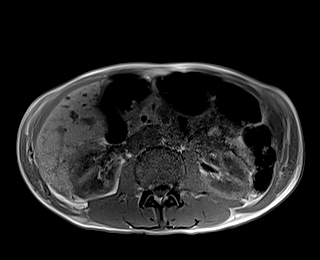
[im 80/80]
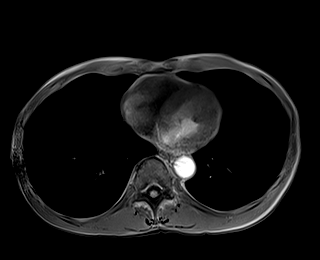

[Series 10: cor obl thk · sagittal · 50.0mm · 0.78mm/px · 1 of 9 slices shown]
[im 1/9]
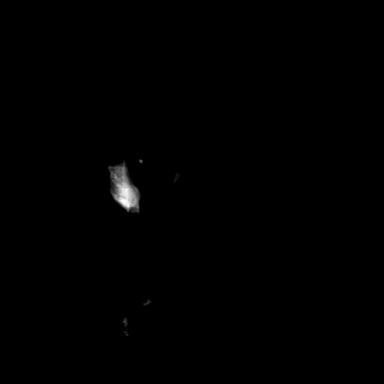

[Series 11: T2 · axial · 6.0mm · 1.56mm/px · 1 of 30 slices shown (2 of 2)]
[im 1/30]
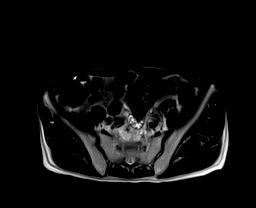

[Series 13: T1 dynamic · axial · 3.0mm · 1.12mm/px · z∈[-146,+115]mm · 3 of 88 slices shown (1 of 10)]
[im 1/88]
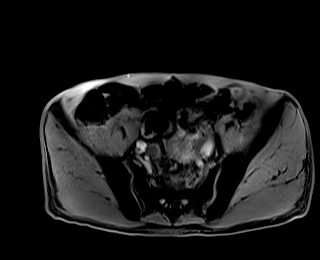
[im 44/88]
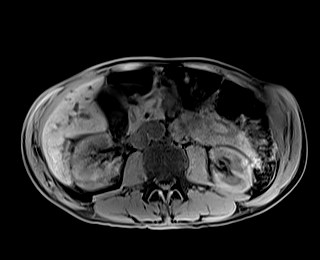
[im 88/88]
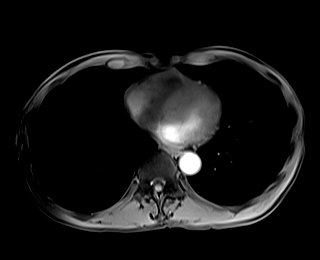

[Series 17: T1 dynamic · axial · 3.0mm · 1.12mm/px · z∈[-146,+115]mm · 3 of 88 slices shown (2 of 10)]
[im 1/88]
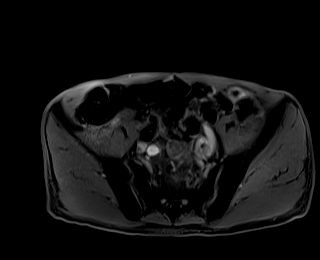
[im 44/88]
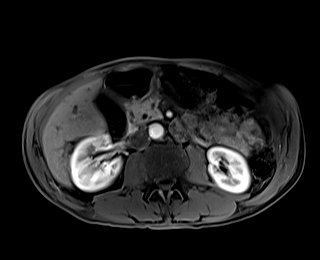
[im 88/88]
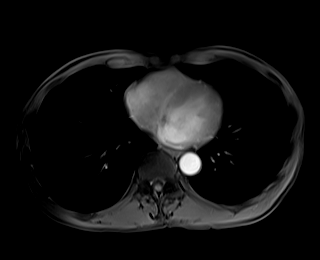

[Series 18: T1 dynamic · axial · 3.0mm · 1.12mm/px · z∈[-146,+115]mm · 3 of 88 slices shown (3 of 10)]
[im 1/88]
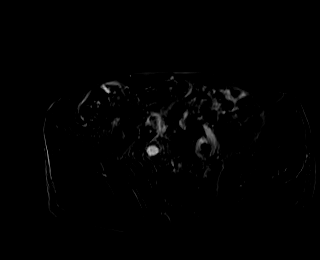
[im 44/88]
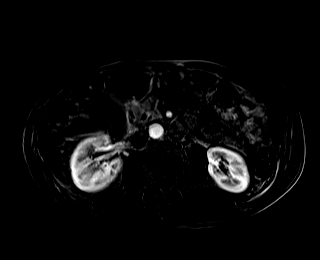
[im 88/88]
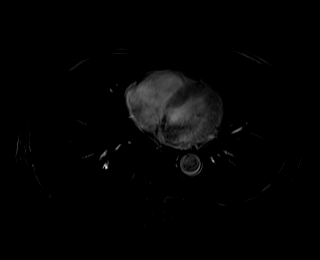

[Series 21: T1 dynamic · axial · 3.0mm · 1.12mm/px · z∈[-146,+115]mm · 3 of 88 slices shown (4 of 10)]
[im 1/88]
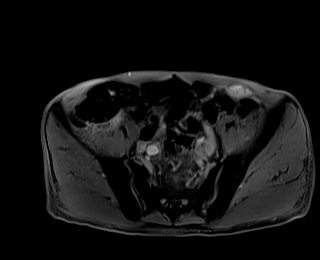
[im 44/88]
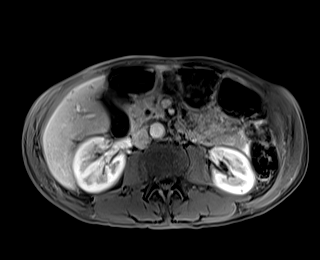
[im 88/88]
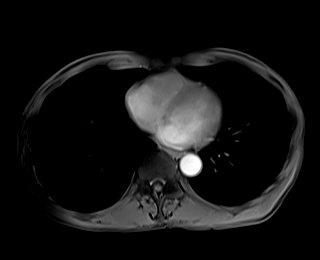

[Series 22: T1 dynamic · axial · 3.0mm · 1.12mm/px · z∈[-146,+115]mm · 3 of 88 slices shown (5 of 10)]
[im 1/88]
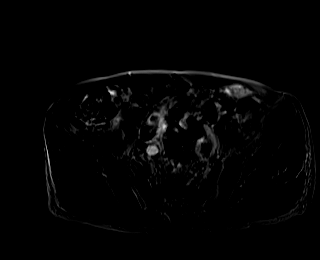
[im 44/88]
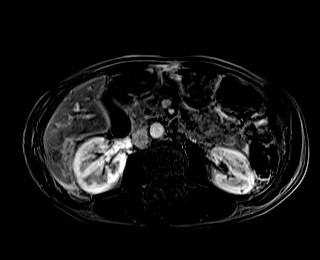
[im 88/88]
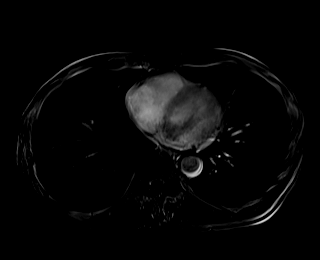

[Series 25: T1 dynamic · axial · 3.0mm · 1.12mm/px · z∈[-146,+115]mm · 3 of 88 slices shown (6 of 10)]
[im 1/88]
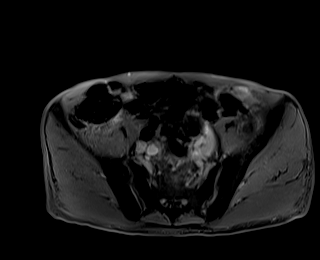
[im 44/88]
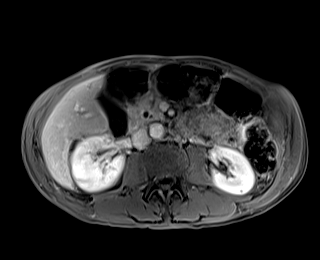
[im 88/88]
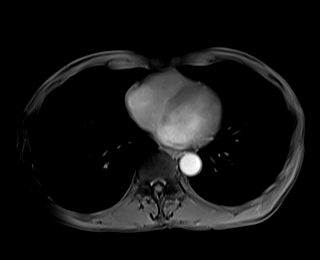

[Series 26: T1 dynamic · axial · 3.0mm · 1.12mm/px · z∈[-146,+115]mm · 3 of 88 slices shown (7 of 10)]
[im 1/88]
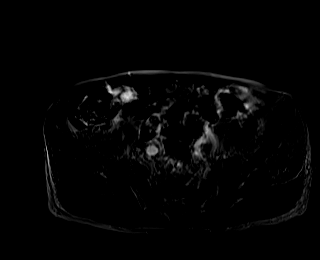
[im 44/88]
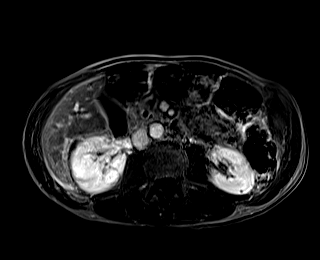
[im 88/88]
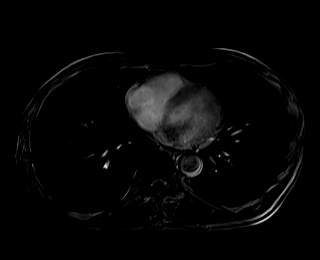

[Series 28: T1 dynamic · coronal · 3.0mm · 1.25mm/px · 3 of 72 slices shown (8 of 10)]
[im 1/72]
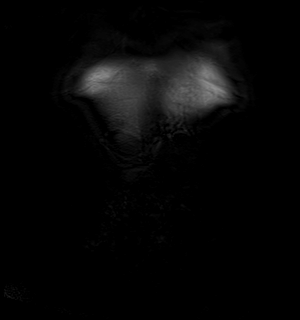
[im 36/72]
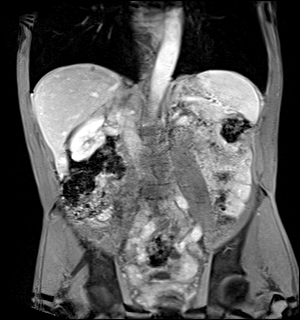
[im 72/72]
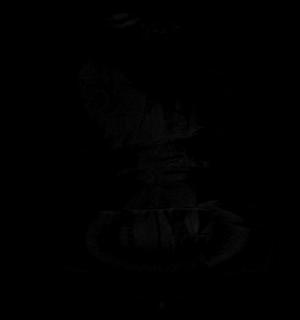

[Series 31: T1 dynamic · axial · 3.0mm · 1.12mm/px · z∈[-146,+115]mm · 3 of 88 slices shown (9 of 10)]
[im 1/88]
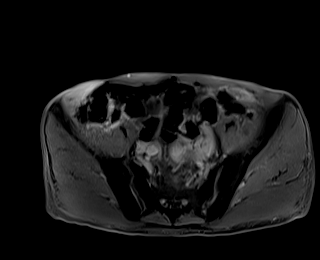
[im 44/88]
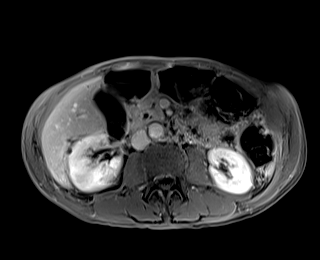
[im 88/88]
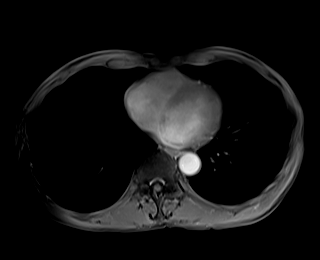

[Series 32: T1 dynamic · axial · 3.0mm · 1.12mm/px · z∈[-146,+115]mm · 3 of 88 slices shown (10 of 10)]
[im 1/88]
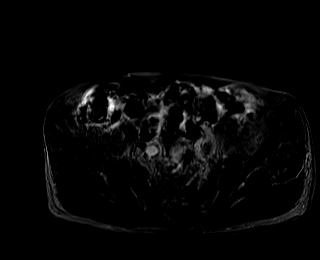
[im 44/88]
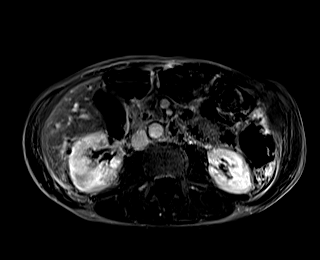
[im 88/88]
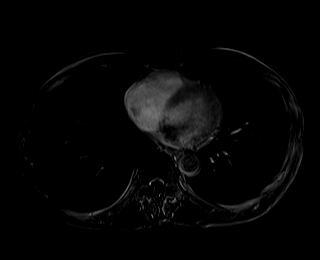

[48 of 48 positions shown; findings below may reference images not displayed]

FINDINGS: Lower chest: Unremarkable.

Hepatobiliary: There are multiple small lesions scattered throughout
the liver which are T1 hypointense, mildly T2 hyperintense, without
definite internal enhancement on post gadolinium imaging and without
definitive diffusion restriction. The largest of these measure up to
9 mm in segment 7 of the liver (axial image 21 of series 21). No
intra or extrahepatic biliary ductal dilatation. Multiple tiny
filling defects lie dependently in the gallbladder, compatible with
gallstones. Gallbladder is moderately distended. No gallbladder wall
thickening or pericholecystic fluid or surrounding inflammatory
changes to suggest an acute cholecystitis at this time. Common bile
duct measures 5 mm in the porta hepatis.

Pancreas: No pancreatic mass. No pancreatic ductal dilatation. No
pancreatic or peripancreatic fluid collections or inflammatory
changes.

Spleen:  Unremarkable.

Adrenals/Urinary Tract: Subcentimeter T1 hypointense, T2
hyperintense, nonenhancing lesions in both kidneys, compatible with
tiny simple cysts. Bilateral adrenal glands are normal in
appearance. No hydroureteronephrosis in the visualized portions of
the abdomen.

Stomach/Bowel: Visualized portions are unremarkable.

Vascular/Lymphatic: No aneurysm identified in the visualized
abdominal vasculature. No lymphadenopathy noted in the abdomen.

Other: No significant volume of ascites noted in the visualized
portions of the peritoneal cavity.

Musculoskeletal: No aggressive appearing osseous lesions are noted
in the visualized portions of the skeleton.
IMPRESSION: 1. Multiple small liver lesions, as above. These have indeterminate
imaging characteristics on today's magnetic resonance examination,
however, these are new compared to prior CT the abdomen and pelvis
[DATE]. Accordingly, these are strongly favored to represent
metastatic lesions which appear hypovascular on today's examination,
presumably as a consequence of prior therapy. Close attention on
follow-up imaging is recommended to ensure stability or regression
of these findings.
2. Cholelithiasis without evidence of acute cholecystitis at this
time.

## 2022-02-10 MED ORDER — GADOBUTROL 1 MMOL/ML IV SOLN
7.0000 mL | Freq: Once | INTRAVENOUS | Status: AC | PRN
  Administered 2022-02-10: 7 mL via INTRAVENOUS

## 2022-02-14 MED FILL — Dexamethasone Sodium Phosphate Inj 100 MG/10ML: INTRAMUSCULAR | Qty: 1 | Status: AC

## 2022-02-15 ENCOUNTER — Encounter: Payer: Self-pay | Admitting: Hematology

## 2022-02-15 ENCOUNTER — Other Ambulatory Visit: Payer: Self-pay

## 2022-02-15 ENCOUNTER — Inpatient Hospital Stay: Payer: Medicare Other | Attending: Physician Assistant | Admitting: Hematology

## 2022-02-15 ENCOUNTER — Inpatient Hospital Stay: Payer: Medicare Other

## 2022-02-15 VITALS — BP 172/98 | HR 64 | Temp 98.2°F | Resp 18 | Ht 72.0 in | Wt 142.2 lb

## 2022-02-15 DIAGNOSIS — C20 Malignant neoplasm of rectum: Secondary | ICD-10-CM | POA: Diagnosis present

## 2022-02-15 DIAGNOSIS — Z95828 Presence of other vascular implants and grafts: Secondary | ICD-10-CM

## 2022-02-15 DIAGNOSIS — D500 Iron deficiency anemia secondary to blood loss (chronic): Secondary | ICD-10-CM | POA: Insufficient documentation

## 2022-02-15 DIAGNOSIS — C7800 Secondary malignant neoplasm of unspecified lung: Secondary | ICD-10-CM | POA: Insufficient documentation

## 2022-02-15 DIAGNOSIS — Z5111 Encounter for antineoplastic chemotherapy: Secondary | ICD-10-CM | POA: Diagnosis present

## 2022-02-15 DIAGNOSIS — C775 Secondary and unspecified malignant neoplasm of intrapelvic lymph nodes: Secondary | ICD-10-CM | POA: Diagnosis not present

## 2022-02-15 LAB — CBC WITH DIFFERENTIAL (CANCER CENTER ONLY)
Abs Immature Granulocytes: 0.01 10*3/uL (ref 0.00–0.07)
Basophils Absolute: 0 10*3/uL (ref 0.0–0.1)
Basophils Relative: 1 %
Eosinophils Absolute: 0.1 10*3/uL (ref 0.0–0.5)
Eosinophils Relative: 2 %
HCT: 41 % (ref 39.0–52.0)
Hemoglobin: 13.4 g/dL (ref 13.0–17.0)
Immature Granulocytes: 0 %
Lymphocytes Relative: 33 %
Lymphs Abs: 1 10*3/uL (ref 0.7–4.0)
MCH: 30.5 pg (ref 26.0–34.0)
MCHC: 32.7 g/dL (ref 30.0–36.0)
MCV: 93.2 fL (ref 80.0–100.0)
Monocytes Absolute: 0.7 10*3/uL (ref 0.1–1.0)
Monocytes Relative: 25 %
Neutro Abs: 1.2 10*3/uL — ABNORMAL LOW (ref 1.7–7.7)
Neutrophils Relative %: 39 %
Platelet Count: 171 10*3/uL (ref 150–400)
RBC: 4.4 MIL/uL (ref 4.22–5.81)
RDW: 15.5 % (ref 11.5–15.5)
WBC Count: 2.9 10*3/uL — ABNORMAL LOW (ref 4.0–10.5)
nRBC: 0 % (ref 0.0–0.2)

## 2022-02-15 LAB — CMP (CANCER CENTER ONLY)
ALT: 16 U/L (ref 0–44)
AST: 27 U/L (ref 15–41)
Albumin: 3.4 g/dL — ABNORMAL LOW (ref 3.5–5.0)
Alkaline Phosphatase: 95 U/L (ref 38–126)
Anion gap: 3 — ABNORMAL LOW (ref 5–15)
BUN: 11 mg/dL (ref 8–23)
CO2: 30 mmol/L (ref 22–32)
Calcium: 9.2 mg/dL (ref 8.9–10.3)
Chloride: 102 mmol/L (ref 98–111)
Creatinine: 0.85 mg/dL (ref 0.61–1.24)
GFR, Estimated: >60 mL/min (ref >60)
Glucose, Bld: 104 mg/dL — ABNORMAL HIGH (ref 70–99)
Potassium: 4.3 mmol/L (ref 3.5–5.1)
Sodium: 135 mmol/L (ref 135–145)
Total Bilirubin: 0.4 mg/dL (ref 0.3–1.2)
Total Protein: 6.9 g/dL (ref 6.5–8.1)

## 2022-02-15 LAB — TOTAL PROTEIN, URINE DIPSTICK: Protein, ur: NEGATIVE mg/dL

## 2022-02-15 MED ORDER — SODIUM CHLORIDE 0.9% FLUSH
10.0000 mL | Freq: Once | INTRAVENOUS | Status: AC
  Administered 2022-02-15: 10 mL

## 2022-02-15 MED ORDER — HEPARIN SOD (PORK) LOCK FLUSH 100 UNIT/ML IV SOLN
500.0000 [IU] | Freq: Once | INTRAVENOUS | Status: AC
  Administered 2022-02-15: 500 [IU]

## 2022-02-15 NOTE — Addendum Note (Signed)
Addended by: Bertram Millard A on: 02/15/2022 12:56 PM ? ? Modules accepted: Orders ? ?

## 2022-02-15 NOTE — Progress Notes (Signed)
DISCONTINUE ON PATHWAY REGIMEN - Colorectal ? ? ?  A cycle is every 21 days: ?    Capecitabine  ?    Bevacizumab-xxxx  ?    Oxaliplatin  ? ?**Always confirm dose/schedule in your pharmacy ordering system** ? ?REASON: Disease Progression ?PRIOR TREATMENT: [id number]: CapeOx + Bevacizumab q21 Days ?TREATMENT RESPONSE: Partial Response (PR) ? ?START ON PATHWAY REGIMEN - Colorectal ? ? ?  A cycle is every 14 days: ?    Bevacizumab-xxxx  ?    Irinotecan  ?    Leucovorin  ?    Fluorouracil  ?    Fluorouracil  ? ?**Always confirm dose/schedule in your pharmacy ordering system** ? ?Patient Characteristics: ?Distant Metastases, Nonsurgical Candidate, KRAS/NRAS Mutation Positive/Unknown (BRAF V600 Wild-Type/Unknown), Standard Cytotoxic Therapy, Second Line Standard Cytotoxic Therapy, Bevacizumab Eligible ?Tumor Location: Rectal ?Therapeutic Status: Distant Metastases ?Microsatellite/Mismatch Repair Status: MSS/pMMR ?BRAF Mutation Status: Wild-Type (no mutation) ?KRAS/NRAS Mutation Status: Mutation Positive ?Standard Cytotoxic Line of Therapy: Second Line Standard Cytotoxic Therapy ?Bevacizumab Eligibility: Eligible ?Intent of Therapy: ?Non-Curative / Palliative Intent, Discussed with Patient ?

## 2022-02-15 NOTE — Progress Notes (Signed)
?[facility]   ?Telephone:(336) [phone] [phone]   ?Clinic Follow up Note  ? ?Patient Care Team: ?Pcp, No as PCP - General ? ?Date of Service:  02/15/2022 ? ?CHIEF COMPLAINT: f/u of metastatic rectal cancer ? ?CURRENT THERAPY:  ?First line chemo CAPOX q3weeks, started on 10/14/21  ?            -Xeloda dose: 1029m AM, 15033mPM days 1-14 days ?-beva added from cycle 2 (11/24/21) ? ?ASSESSMENT & PLAN:  ?Jorge Jorge [name] AMSDENs a 89.o. male with  ? ?1. Rectal Adenocarcinoma, [id number] lung metastasis, MMR normal, KRAS(+) [id number]-Presented with rectal bleeding and frequent bowel movement. CT AP 07/23/21 showed rectal mass with no significant adenopathy or distant metastasis. Colonoscopy 08/19/21 showed a 7 cm infiltrative mass in the mid rectum (6-13cm from anal verge), pathology showed adenocarcinoma. MMR is normal ?-staging pelvic MRI 08/27/21 showed: staging at T4b; N2 likely associated with early extra mesorectal lymph node involvement; distance from tumor to internal anal sphincter is 1.2 cm; also with extramural venous involvement. He has biopsy proven lung mets ?-baseline CEA 571.84 on 09/06/21. ?-he received concurrent chemoRT with Xeloda 11/1-11/21/22 under the care of Dr. MoLisbeth Renshaw He responded clinically, no recurrent rectal bleeding, his pain has improved  ?-He began first-line CAPOX and bevacizumab on 10/14/21, taking Xeloda 1500 mg BID 2 weeks on/1 week off. He tolerated very well overall.  ?-Cycle 2 CAPOX delayed due to elevated liver enzymes, resumed with dose-reduced Xeloda upon improvement, takes Xeloda at 1000 mg a.m. and 1500 mg p.m. for 14 days. He is tolerating this dose well. ?-restaging CT CAP on 01/23/22 showed: decrease in size of pulmonary nodules; approximately 10 new liver lesions. ?-further evaluation with abdomen MRI on 02/10/22 showed: multiple indeterminate liver lesions, new compared to prior CT in 07/2021. I reviewed the results with him and his daughter  today. ?-I discussed changing treatment with him today. I recommend switching to FOLFIRI and Bevacizumab. I discussed the indications and side effects with him today. He agrees to proceed. We will plan to start next week. ?-we will hold oxali today, and he knows not to restart Xeloda. ?-labs reviewed, overall improved. Urine protein negative. ?-f/u in 2 weeks for toxicity checkup ?  ?2. Mild anemia from rectal bleeding, probable iron deficiency ?-He has started oral iron, tolerating well.  We will continue. ?-hgb overall stable, mild anemia at 11-12.8 range ?-hgb is WNL at 13.4 today (02/15/22) ?  ?3. Multiple colon polyps ?-He has a large 3.5 cm polyps in the hepatic flexure, biopsy showed tubular adenoma. He is scheduled for f/u with Dr. MaRush Landmarkn 12/13/21.  They discussed polypectomy/resection to remove it ?-In the context of his stage IV rectal cancer, he prefers to defer resection for now, which we agree ?-He had additional 4 small polyps which were removed, path showed tubular adenomas. ?  ? ?4. Pain and narcotics  ?-Patient states his rectal pain has resolved.  He is to take MS Contin once every few days, and oxycodone 2-3 times a day.  He states if he does not take it, he has body aches and discomfort, but that he repeatedly say is not pain.  I am little concerned he has narcotics dependence ?-We will stop refill MS Contin, and decrease oxycodone to 5 mg, 1 to 2 tablets every 6 hours as needed ?-I called his daughter and she will keep an eye on his pain and meds  ?  ?PLAN: ?-lab and  MRI scan reviewed, highly concerning for new liver metastasis. ?-hold oxali today, and stop Xeloda ?-plan to start FOLFIRI and bevacizumab in one week ?-phone visit in 2 weeks for  ?            -Thursday is preferable for pt's daughter. ? ? ?No problem-specific Assessment & Plan notes found for this encounter. ? ? ?SUMMARY OF ONCOLOGIC HISTORY: ?Oncology History Overview Note  ?Cancer Staging ?Rectal cancer ([name]) ?Staging form:  Colon and Rectum, AJCC 8th Edition ?- Clinical stage from 08/29/2021: [name], cM1 - Signed by [name], MD on 08/29/2021 ? ?  ?Rectal cancer ([place])  ?07/23/2021 Imaging  ? CT AP ? ?IMPRESSION: ?1. Appearance of the rectum likely indicates a rectal mass ?suspicious for rectal carcinoma. Inflammatory process would be less likely. Direct visualization is suggested. ?2. Cholelithiasis without evidence of acute cholecystitis. ?3. Aortic atherosclerosis. ?  ?08/19/2021 Procedure  ? Colonoscopy, under Dr. [name] ? ?Impression: ?- Rectal tenderness, palpable rectal mass and hemorrhoids found on digital rectal exam. ?- Stool in the entire examined colon - lavaged with still inadequate clearance. ?- One 35 mm polyp at the hepatic flexure. Biopsied. Tattooed distal in case future endoscopic resection is considered - however, has larger issues with rectal mass currently as below. ?- Four, 5 to 11 mm polyps in the transverse colon and at the hepatic flexure, removed with a cold snare. Resected and retrieved. ?- Diverticulosis in the recto-sigmoid colon and in the sigmoid colon. ?- Rule out malignancy, partially obstructing tumor in the rectum and from 6 to 13 cm proximal to the anus. Biopsied. ?  ?08/19/2021 Pathology Results  ? Diagnosis ?1. Hepatic Flexure Biopsy ?- TUBULAR ADENOMA WITHOUT HIGH-GRADE DYSPLASIA OR MALIGNANCY ?2. Transverse Colon Biopsy, and hepatic flexure, polyps (4) ?- TUBULAR ADENOMA WITHOUT HIGH-GRADE DYSPLASIA OR MALIGNANCY ?- OTHER FRAGMENTS OF POLYPOID COLONIC MUCOSA WITH NO SPECIFIC HISTOPATHOLOGIC CHANGES ?- FOOD MATERIAL ?3. Rectum, biopsy ?- ADENOCARCINOMA. SEE NOTE ?  ?08/22/2021 Initial Diagnosis  ? Rectal cancer [facility]) ?  ?08/26/2021 Imaging  ? IMPRESSION: ?1. A 2.2 x 1.9 cm right middle lobe pulmonary nodule as well as a ?total of four right upper lobe pulmonary nodule and masses measuring up to 3.6 cm. Findings concerning for metastatic primary lung cancer versus less likely metastases in a  patient with rectal cancer. Additional imaging evaluation or consultation with Pulmonology or Thoracic Surgery recommended. ?2. No gross hilar adenopathy, noting limited sensitivity for the ?detection of hilar adenopathy on this noncontrast study. ?3. Cholelithiasis. ?4.  Emphysema (ICD10-J43.9). ?5. At least left anterior descending coronary artery calcifications. ?  ?08/27/2021 Imaging  ? IMPRESSION: ?Rectal adenocarcinoma T stage: T4 B ?  ?Rectal adenocarcinoma N stage: N2 disease likely associated with ?early extra mesorectal lymph node involvement. ?  ?Distance from tumor to the internal anal sphincter is 1.2 cm. ?  ?Also with extramural venous involvement as described. ?  ?08/29/2021 Cancer Staging  ? Staging form: Colon and Rectum, AJCC 8th Edition ?- Clinical stage from 08/29/2021: [name], cM1 - Signed by [name], MD on 08/29/2021 ?Stage prefix: Initial diagnosis ? ?  ?01/23/2022 Imaging  ? CT CAP w contrast IMPRESSION: ?1. Mild interval decrease in size of multiple right-sided pulmonary nodules. No new suspicious pulmonary nodule or mass. ?2. Interval development of approximately 10 new ill-defined hypoattenuating liver lesions ranging in size from approximately 5-10 mm. Imaging features suspicious for metastatic disease. MRI abdomen with and without contrast may prove helpful to further ?evaluate. ?3. Similar appearance of  wall thickening in the rectum with perirectal edema. ?4. Cholelithiasis. ?5. Prostatomegaly. ?6. Aortic Atherosclerosis (ICD10-I70.0). ?  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  ?10/14/2021 - 01/25/2022 Chemotherapy  ? Patient is on Treatment Plan : COLORECTAL CapeOx + Bevacizumab q21d  ?   ? ? ? ?INTERVAL HISTORY:  ?Jorge Jorge [name] Jorge [name] is here for a follow up of metastatic rectal cancer. He was last seen by NP Lacie on 01/25/22. He presents to the clinic alone. We connected with his daughter via  speaker phone. ?He reports he is feeling well overall. ?  ?All other systems were reviewed with the patient and are negative. ? ?MEDICAL HISTORY:  ?Past Medical History:  ?Diagnosis Date  ? Anemia   ? Rectal ad

## 2022-02-16 ENCOUNTER — Telehealth: Payer: Self-pay | Admitting: Hematology

## 2022-02-16 ENCOUNTER — Other Ambulatory Visit (HOSPITAL_COMMUNITY): Payer: Self-pay

## 2022-02-16 ENCOUNTER — Encounter (HOSPITAL_COMMUNITY): Payer: Self-pay | Admitting: Hematology

## 2022-02-16 NOTE — Telephone Encounter (Signed)
Scheduled follow-up appointments per 4/12 los. Patient's daughter is aware. ?

## 2022-02-17 ENCOUNTER — Other Ambulatory Visit: Payer: Self-pay | Admitting: Hematology

## 2022-02-17 ENCOUNTER — Encounter: Payer: Self-pay | Admitting: Hematology

## 2022-02-17 MED ORDER — OXYCODONE HCL 5 MG PO TABS: 5.0000 mg | ORAL_TABLET | Freq: Four times a day (QID) | ORAL | 0 refills | Status: DC | PRN

## 2022-02-18 ENCOUNTER — Encounter: Payer: Self-pay | Admitting: Hematology

## 2022-02-21 ENCOUNTER — Inpatient Hospital Stay: Payer: Medicare Other

## 2022-02-21 ENCOUNTER — Other Ambulatory Visit: Payer: Self-pay

## 2022-02-21 VITALS — BP 181/91 | HR 59 | Temp 97.9°F | Resp 17 | Wt 140.8 lb

## 2022-02-21 DIAGNOSIS — Z95828 Presence of other vascular implants and grafts: Secondary | ICD-10-CM

## 2022-02-21 DIAGNOSIS — Z5111 Encounter for antineoplastic chemotherapy: Secondary | ICD-10-CM | POA: Diagnosis not present

## 2022-02-21 DIAGNOSIS — C20 Malignant neoplasm of rectum: Secondary | ICD-10-CM

## 2022-02-21 LAB — CMP (CANCER CENTER ONLY)
ALT: 19 U/L (ref 0–44)
AST: 32 U/L (ref 15–41)
Albumin: 3.6 g/dL (ref 3.5–5.0)
Alkaline Phosphatase: 94 U/L (ref 38–126)
Anion gap: 4 — ABNORMAL LOW (ref 5–15)
BUN: 9 mg/dL (ref 8–23)
CO2: 29 mmol/L (ref 22–32)
Calcium: 9.1 mg/dL (ref 8.9–10.3)
Chloride: 102 mmol/L (ref 98–111)
Creatinine: 0.88 mg/dL (ref 0.61–1.24)
GFR, Estimated: >60 mL/min (ref >60)
Glucose, Bld: 100 mg/dL — ABNORMAL HIGH (ref 70–99)
Potassium: 4.5 mmol/L (ref 3.5–5.1)
Sodium: 135 mmol/L (ref 135–145)
Total Bilirubin: 0.5 mg/dL (ref 0.3–1.2)
Total Protein: 7.1 g/dL (ref 6.5–8.1)

## 2022-02-21 LAB — CEA (IN HOUSE-CHCC): CEA (CHCC-In House): 37.83 ng/mL — ABNORMAL HIGH (ref 0.00–5.00)

## 2022-02-21 LAB — CBC WITH DIFFERENTIAL (CANCER CENTER ONLY)
Abs Immature Granulocytes: 0.01 10*3/uL (ref 0.00–0.07)
Basophils Absolute: 0 10*3/uL (ref 0.0–0.1)
Basophils Relative: 1 %
Eosinophils Absolute: 0 10*3/uL (ref 0.0–0.5)
Eosinophils Relative: 1 %
HCT: 42 % (ref 39.0–52.0)
Hemoglobin: 13.8 g/dL (ref 13.0–17.0)
Immature Granulocytes: 0 %
Lymphocytes Relative: 28 %
Lymphs Abs: 0.9 10*3/uL (ref 0.7–4.0)
MCH: 30.7 pg (ref 26.0–34.0)
MCHC: 32.9 g/dL (ref 30.0–36.0)
MCV: 93.5 fL (ref 80.0–100.0)
Monocytes Absolute: 0.7 10*3/uL (ref 0.1–1.0)
Monocytes Relative: 21 %
Neutro Abs: 1.6 10*3/uL — ABNORMAL LOW (ref 1.7–7.7)
Neutrophils Relative %: 49 %
Platelet Count: 128 10*3/uL — ABNORMAL LOW (ref 150–400)
RBC: 4.49 MIL/uL (ref 4.22–5.81)
RDW: 15.8 % — ABNORMAL HIGH (ref 11.5–15.5)
WBC Count: 3.2 10*3/uL — ABNORMAL LOW (ref 4.0–10.5)
nRBC: 0 % (ref 0.0–0.2)

## 2022-02-21 LAB — TOTAL PROTEIN, URINE DIPSTICK: Protein, ur: NEGATIVE mg/dL

## 2022-02-21 MED ORDER — SODIUM CHLORIDE 0.9 % IV SOLN
150.0000 mg/m2 | Freq: Once | INTRAVENOUS | Status: AC
  Administered 2022-02-21: 280 mg via INTRAVENOUS
  Filled 2022-02-21: qty 14

## 2022-02-21 MED ORDER — ATROPINE SULFATE 1 MG/ML IV SOLN
0.5000 mg | Freq: Once | INTRAVENOUS | Status: AC | PRN
  Administered 2022-02-21: 0.5 mg via INTRAVENOUS
  Filled 2022-02-21: qty 1

## 2022-02-21 MED ORDER — PALONOSETRON HCL INJECTION 0.25 MG/5ML
0.2500 mg | Freq: Once | INTRAVENOUS | Status: AC
  Administered 2022-02-21: 0.25 mg via INTRAVENOUS
  Filled 2022-02-21: qty 5

## 2022-02-21 MED ORDER — SODIUM CHLORIDE 0.9 % IV SOLN
400.0000 mg/m2 | Freq: Once | INTRAVENOUS | Status: DC
  Filled 2022-02-21: qty 36.2

## 2022-02-21 MED ORDER — SODIUM CHLORIDE 0.9 % IV SOLN
10.0000 mg | Freq: Once | INTRAVENOUS | Status: AC
  Administered 2022-02-21: 10 mg via INTRAVENOUS
  Filled 2022-02-21: qty 10

## 2022-02-21 MED ORDER — SODIUM CHLORIDE 0.9 % IV SOLN
400.0000 mg/m2 | Freq: Once | INTRAVENOUS | Status: AC
  Administered 2022-02-21: 724 mg via INTRAVENOUS
  Filled 2022-02-21: qty 36.2

## 2022-02-21 MED ORDER — SODIUM CHLORIDE 0.9% FLUSH
10.0000 mL | Freq: Once | INTRAVENOUS | Status: AC
  Administered 2022-02-21: 10 mL

## 2022-02-21 MED ORDER — SODIUM CHLORIDE 0.9 % IV SOLN: Freq: Once | INTRAVENOUS | Status: AC

## 2022-02-21 MED ORDER — SODIUM CHLORIDE 0.9 % IV SOLN
2400.0000 mg/m2 | INTRAVENOUS | Status: DC
  Administered 2022-02-21: 4350 mg via INTRAVENOUS
  Filled 2022-02-21: qty 87

## 2022-02-21 NOTE — Progress Notes (Signed)
MD holding Bevacizumab secondary to elevated BP  ? ?Larene Beach, PharmD ? ?

## 2022-02-21 NOTE — Patient Instructions (Addendum)
[facility]  Discharge Instructions: ?Thank you for choosing [facility] to provide your oncology and hematology care.  ? ?If you have a lab appointment with the [facility], please go directly to the [facility] and check in at the registration area. ?  ?Wear comfortable clothing and clothing appropriate for easy access to any Portacath or PICC line.  ? ?We strive to give you quality time with your provider. You may need to reschedule your appointment if you arrive late (15 or more minutes).  Arriving late affects you and other patients whose appointments are after yours.  Also, if you miss three or more appointments without notifying the office, you may be dismissed from the clinic at the provider?s discretion.    ?  ?For prescription refill requests, have your pharmacy contact our office and allow 72 hours for refills to be completed.   ? ?Today you received the following chemotherapy and/or immunotherapy agents: Irinotecan, Leucovorin, Fluoruracil    ?  ?To help prevent nausea and vomiting after your treatment, we encourage you to take your nausea medication as directed. ? ?BELOW ARE SYMPTOMS THAT SHOULD BE REPORTED IMMEDIATELY: ?*FEVER GREATER THAN 100.4 F (38 ?C) OR HIGHER ?*CHILLS OR SWEATING ?*NAUSEA AND VOMITING THAT IS NOT CONTROLLED WITH YOUR NAUSEA MEDICATION ?*UNUSUAL SHORTNESS OF BREATH ?*UNUSUAL BRUISING OR BLEEDING ?*URINARY PROBLEMS (pain or burning when urinating, or frequent urination) ?*BOWEL PROBLEMS (unusual diarrhea, constipation, pain near the anus) ?TENDERNESS IN MOUTH AND THROAT WITH OR WITHOUT PRESENCE OF ULCERS (sore throat, sores in mouth, or a toothache) ?UNUSUAL RASH, SWELLING OR PAIN  ?UNUSUAL VAGINAL DISCHARGE OR ITCHING  ? ?Items with * indicate a potential emergency and should be followed up as soon as possible or go to the Emergency Department if any problems should occur. ? ?Please show the CHEMOTHERAPY ALERT CARD or IMMUNOTHERAPY  ALERT CARD at check-in to the Emergency Department and triage nurse. ? ?Should you have questions after your visit or need to cancel or reschedule your appointment, please contact [facility]  Dept: [phone]  and follow the prompts.  Office hours are 8:00 a.m. to 4:30 p.m. Monday - Friday. Please note that voicemails left after 4:00 p.m. may not be returned until the following business day.  We are closed weekends and major holidays. You have access to a nurse at all times for urgent questions. Please call the main number to the clinic Dept: [phone] and follow the prompts. ? ? ?For any non-urgent questions, you may also contact your provider using MyChart. We now offer e-Visits for anyone 21 and older to request care online for non-urgent symptoms. For details visit [email or web address]. ?  ?Also download the MyChart app! Go to the app store, search "MyChart", open the app, select [facility], and log in with your MyChart username and password. ? ?Due to Covid, a mask is required upon entering the hospital/clinic. If you do not have a mask, one will be given to you upon arrival. For doctor visits, patients may have 1 support Jorge [name] aged 71 or older with them. For treatment visits, patients cannot have anyone with them due to current Covid guidelines and our immunocompromised population.  ? ?Leucovorin injection ?What is this medication? ?LEUCOVORIN (loo koe VOR in) is used to prevent or treat the harmful effects of some medicines. This medicine is used to treat anemia caused by a low amount of folic acid in the body. It is also used with 5-fluorouracil (  5-FU) to treat colon cancer. ?This medicine may be used for other purposes; ask your health care provider or pharmacist if you have questions. ?What should I tell my care team before I take this medication? ?They need to know if you have any of these conditions: ?anemia from low levels of vitamin B-12 in the blood ?an unusual  or allergic reaction to leucovorin, folic acid, other medicines, foods, dyes, or preservatives ?pregnant or trying to get pregnant ?breast-feeding ?How should I use this medication? ?This medicine is for injection into a muscle or into a vein. It is given by a health care professional in a hospital or clinic setting. ?Talk to your pediatrician regarding the use of this medicine in children. Special care may be needed. ?Overdosage: If you think you have taken too much of this medicine contact a poison control center or emergency room at once. ?NOTE: This medicine is only for you. Do not share this medicine with others. ?What if I miss a dose? ?This does not apply. ?What may interact with this medication? ?capecitabine ?fluorouracil ?phenobarbital ?phenytoin ?primidone ?trimethoprim-sulfamethoxazole ?This list may not describe all possible interactions. Give your health care provider a list of all the medicines, herbs, non-prescription drugs, or dietary supplements you use. Also tell them if you smoke, drink alcohol, or use illegal drugs. Some items may interact with your medicine. ?What should I watch for while using this medication? ?Your condition will be monitored carefully while you are receiving this medicine. ?This medicine may increase the side effects of 5-fluorouracil, 5-FU. Tell your doctor or health care professional if you have diarrhea or mouth sores that do not get better or that get worse. ?What side effects may I notice from receiving this medication? ?Side effects that you should report to your doctor or health care professional as soon as possible: ?allergic reactions like skin rash, itching or hives, swelling of the face, lips, or tongue ?breathing problems ?fever, infection ?mouth sores ?unusual bleeding or bruising ?unusually weak or tired ?Side effects that usually do not require medical attention (report to your doctor or health care professional if they continue or are  bothersome): ?constipation or diarrhea ?loss of appetite ?nausea, vomiting ?This list may not describe all possible side effects. Call your doctor for medical advice about side effects. You may report side effects to FDA at 1-800-FDA-1088. ?Where should I keep my medication? ?This drug is given in a hospital or clinic and will not be stored at home. ?NOTE: This sheet is a summary. It may not cover all possible information. If you have questions about this medicine, talk to your doctor, pharmacist, or health care provider. ?? 2023 Elsevier/Gold Standard (2008-04-30 00:00:00) ? ?Irinotecan injection ?What is this medication? ?IRINOTECAN (ir in oh TEE kan ) is a chemotherapy drug. It is used to treat colon and rectal cancer. ?This medicine may be used for other purposes; ask your health care provider or pharmacist if you have questions. ?COMMON BRAND NAME(S): Camptosar ?What should I tell my care team before I take this medication? ?They need to know if you have any of these conditions: ?dehydration ?diarrhea ?infection (especially a virus infection such as chickenpox, cold sores, or herpes) ?liver disease ?low blood counts, like low white cell, platelet, or red cell counts ?low levels of calcium, magnesium, or potassium in the blood ?recent or ongoing radiation therapy ?an unusual or allergic reaction to irinotecan, other medicines, foods, dyes, or preservatives ?pregnant or trying to get pregnant ?breast-feeding ?How should I use this  medication? ?This drug is given as an infusion into a vein. It is administered in a hospital or clinic by a specially trained health care professional. ?Talk to your pediatrician regarding the use of this medicine in children. Special care may be needed. ?Overdosage: If you think you have taken too much of this medicine contact a poison control center or emergency room at once. ?NOTE: This medicine is only for you. Do not share this medicine with others. ?What if I miss a dose? ?It is  important not to miss your dose. Call your doctor or health care professional if you are unable to keep an appointment. ?What may interact with this medication? ?Do not take this medicine with any of the following medication

## 2022-02-22 ENCOUNTER — Inpatient Hospital Stay: Payer: Medicare Other

## 2022-02-22 ENCOUNTER — Telehealth: Payer: Self-pay

## 2022-02-22 NOTE — Telephone Encounter (Signed)
-----   Message from Daphane Shepherd, RN sent at 02/21/2022  4:35 PM EDT ----- ?Regarding: First time [facility] ?Pt of [name], first time [facility]. Educated on pump and sent home with supplies. Verbalized understanding, in no distress upon discharge.  ? ?

## 2022-02-22 NOTE — Telephone Encounter (Signed)
LM for patient that this nurse was calling to see how they were doing after their treatment. Please call back to Dr.  Feng's nurse at [phone] if they have any questions or concerns regarding the treatment. 

## 2022-02-23 ENCOUNTER — Other Ambulatory Visit: Payer: Self-pay

## 2022-02-23 ENCOUNTER — Inpatient Hospital Stay: Payer: Medicare Other

## 2022-02-23 VITALS — BP 172/85 | HR 73 | Temp 98.7°F | Resp 17

## 2022-02-23 DIAGNOSIS — Z5111 Encounter for antineoplastic chemotherapy: Secondary | ICD-10-CM | POA: Diagnosis not present

## 2022-02-23 DIAGNOSIS — C20 Malignant neoplasm of rectum: Secondary | ICD-10-CM

## 2022-02-23 MED ORDER — SODIUM CHLORIDE 0.9% FLUSH
10.0000 mL | INTRAVENOUS | Status: DC | PRN
  Administered 2022-02-23: 10 mL

## 2022-02-23 MED ORDER — HEPARIN SOD (PORK) LOCK FLUSH 100 UNIT/ML IV SOLN
500.0000 [IU] | Freq: Once | INTRAVENOUS | Status: AC | PRN
  Administered 2022-02-23: 500 [IU]

## 2022-02-24 ENCOUNTER — Inpatient Hospital Stay: Payer: Medicare Other

## 2022-03-01 NOTE — Progress Notes (Signed)
?[facility]   ?[phone] [phone]   ?Clinic Follow up Note  ? ?Patient Care Team: ?Pcp, No as PCP - General ?03/02/2022 ? ?CHIEF COMPLAINT: Chemo toxicity check ? ?SUMMARY OF ONCOLOGIC HISTORY: ?Oncology History Overview Note  ?Cancer Staging ?Rectal cancer ([place]) ?Staging form: Colon and Rectum, AJCC 8th Edition ?- Clinical stage from 08/29/2021: [name], cM1 - Signed by [name], MD on 08/29/2021 ? ?  ?Rectal cancer ([place])  ?07/23/2021 Imaging  ? CT AP ? ?IMPRESSION: ?1. Appearance of the rectum likely indicates a rectal mass ?suspicious for rectal carcinoma. Inflammatory process would be less likely. Direct visualization is suggested. ?2. Cholelithiasis without evidence of acute cholecystitis. ?3. Aortic atherosclerosis. ?  ?08/19/2021 Procedure  ? Colonoscopy, under Dr. [name] ? ?Impression: ?- Rectal tenderness, palpable rectal mass and hemorrhoids found on digital rectal exam. ?- Stool in the entire examined colon - lavaged with still inadequate clearance. ?- One 35 mm polyp at the hepatic flexure. Biopsied. Tattooed distal in case future endoscopic resection is considered - however, has larger issues with rectal mass currently as below. ?- Four, 5 to 11 mm polyps in the transverse colon and at the hepatic flexure, removed with a cold snare. Resected and retrieved. ?- Diverticulosis in the recto-sigmoid colon and in the sigmoid colon. ?- Rule out malignancy, partially obstructing tumor in the rectum and from 6 to 13 cm proximal to the anus. Biopsied. ?  ?08/19/2021 Pathology Results  ? Diagnosis ?1. Hepatic Flexure Biopsy ?- TUBULAR ADENOMA WITHOUT HIGH-GRADE DYSPLASIA OR MALIGNANCY ?2. Transverse Colon Biopsy, and hepatic flexure, polyps (4) ?- TUBULAR ADENOMA WITHOUT HIGH-GRADE DYSPLASIA OR MALIGNANCY ?- OTHER FRAGMENTS OF POLYPOID COLONIC MUCOSA WITH NO SPECIFIC HISTOPATHOLOGIC CHANGES ?- FOOD MATERIAL ?3. Rectum, biopsy ?- ADENOCARCINOMA. SEE NOTE ?  ?08/22/2021  Initial Diagnosis  ? Rectal cancer [facility]) ?  ?08/26/2021 Imaging  ? IMPRESSION: ?1. A 2.2 x 1.9 cm right middle lobe pulmonary nodule as well as a ?total of four right upper lobe pulmonary nodule and masses measuring up to 3.6 cm. Findings concerning for metastatic primary lung cancer versus less likely metastases in a patient with rectal cancer. Additional imaging evaluation or consultation with Pulmonology or Thoracic Surgery recommended. ?2. No gross hilar adenopathy, noting limited sensitivity for the ?detection of hilar adenopathy on this noncontrast study. ?3. Cholelithiasis. ?4.  Emphysema (ICD10-J43.9). ?5. At least left anterior descending coronary artery calcifications. ?  ?08/27/2021 Imaging  ? IMPRESSION: ?Rectal adenocarcinoma T stage: T4 B ?  ?Rectal adenocarcinoma N stage: N2 disease likely associated with ?early extra mesorectal lymph node involvement. ?  ?Distance from tumor to the internal anal sphincter is 1.2 cm. ?  ?Also with extramural venous involvement as described. ?  ?08/29/2021 Cancer Staging  ? Staging form: Colon and Rectum, AJCC 8th Edition ?- Clinical stage from 08/29/2021: [name], cM1 - Signed by [name], MD on 08/29/2021 ?Stage prefix: Initial diagnosis ? ?  ?01/23/2022 Imaging  ? CT CAP w contrast IMPRESSION: ?1. Mild interval decrease in size of multiple right-sided pulmonary nodules. No new suspicious pulmonary nodule or mass. ?2. Interval development of approximately 10 new ill-defined hypoattenuating liver lesions ranging in size from approximately 5-10 mm. Imaging features suspicious for metastatic disease. MRI abdomen with and without contrast may prove helpful to further ?evaluate. ?3. Similar appearance of wall thickening in the rectum with perirectal edema. ?4. Cholelithiasis. ?5. Prostatomegaly. ?6. Aortic Atherosclerosis (ICD10-I70.0). ?  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 ?   ?10/14/2021 - 01/25/2022 Chemotherapy  ? Patient is on Treatment Plan : COLORECTAL CapeOx + Bevacizumab q21d  ? ?  ?  ?02/21/2022 -  Chemotherapy  ? Patient is on Treatment Plan : COLORECTAL FOLFIRI / BEVACIZUMAB Q14D  ? ?  ?  ? ? ?CURRENT THERAPY:  ?First line chemo CAPOX q3weeks, started on 10/14/21  ?-Xeloda dose: '1000mg'$  AM, '1500mg'$  PM days 1-14 days ?-beva added from cycle 2 (11/24/21) ? ?Switched to second line FOLFIRI and bevacizumab 02/21/2022, irinotecan dose-reduced for C1 ? ? ?INTERVAL HISTORY: Mr. Caponi was scheduled for phone toxicity check but he came in person. He began second line cycle 1 FOLFIRI on 02/21/2022. Irinotecan was dose reduced with cycle 1.  He tolerated well without noticeable side effects.  The 5-FU pump bag was awkward at first.  Bowels move normally every 1-3 days.  Able to eat and drink well and remain active with periodic fatigue.  He cares for his grandchildren at times.  He is under a great deal of family stress, specifically with his child's significant other.  His blood pressure is elevated today.  He notes periodic headaches on days he takes care of his grandkids. He takes oxycodone for these headaches, but not daily.  Denies abdominal or rectal pain.  He recently refilled oxycodone but only has 9 left.  His daughter picked up the prescription, he does not remember seeing 90 tablets.  ? ?He still has mild residual cold sensitivity when he takes off his hat.  Denies mucositis, rash, fever, chills, cough, chest pain, dyspnea, leg edema, or any other new specific complaints. ? ?All other systems were reviewed with the patient and are negative. ? ?MEDICAL HISTORY:  ?Past Medical History:  ?Diagnosis Date  ? Anemia   ? 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 ? ?SURGICAL HISTORY: ?Past Surgical History:  ?Procedure Laterality Date  ? BRONCHIAL BIOPSY  09/27/2021  ? Procedure: BRONCHIAL BIOPSIES;  Surgeon: Garner Nash, DO;  Location: [facility] ENDOSCOPY;   Service: Pulmonary;;  ? BRONCHIAL BRUSHINGS  09/27/2021  ? Procedure: BRONCHIAL BRUSHINGS;  Surgeon: Garner Nash, DO;  Location: [facility] ENDOSCOPY;  Service: Pulmonary;;  ? BRONCHIAL NEEDLE ASPIRATION BIOPSY  09/27/2021  ? Procedure: BRONCHIAL NEEDLE ASPIRATION BIOPSIES;  Surgeon: Garner Nash, DO;  Location: [facility] ENDOSCOPY;  Service: Pulmonary;;  ? COLONOSCOPY    ? IR IMAGING GUIDED PORT INSERTION  10/24/2021  ? left breast surgery Left 1968  ? VIDEO BRONCHOSCOPY WITH RADIAL ENDOBRONCHIAL ULTRASOUND  09/27/2021  ? Procedure: RADIAL ENDOBRONCHIAL ULTRASOUND;  Surgeon: Garner Nash, DO;  Location: [facility] ENDOSCOPY;  Service: Pulmonary;;  ? ? ?I have reviewed the social history and family history with the patient and they are unchanged from previous note. ? ?ALLERGIES:  has No Known Allergies. ? ?MEDICATIONS:  ?Current Outpatient Medications  ?Medication Sig Dispense Refill  ? amLODipine (NORVASC) 5 MG tablet Take 1 tablet (5 mg total) by mouth daily. 30 tablet 0  ? carbonyl iron (FEOSOL) 45 MG TABS tablet Take 45 mg by mouth daily.    ? lidocaine-prilocaine (EMLA) cream Apply 1 application topically as needed. 30 g 2  ? Multiple Vitamin (MULTIVITAMIN WITH MINERALS) TABS tablet Take 1 tablet by mouth daily.    ? ondansetron (ZOFRAN) 8 MG tablet Take 1 tablet (8 mg total) by mouth every 8 (eight) hours as needed for nausea or vomiting. 30 tablet 0  ? oxyCODONE (OXY IR/ROXICODONE)  5 MG immediate release tablet Take 1 tablet (5 mg total) by mouth every 6 (six) hours as needed for severe pain. 90 tablet 0  ? ?No current facility-administered medications for this visit.  ? ? ?PHYSICAL EXAMINATION: ?ECOG PERFORMANCE STATUS: 1 - Symptomatic but completely ambulatory ? ?Vitals:  ? 03/02/22 0910 03/02/22 0923  ?BP: (!) 184/112 (!) 180/97  ?Pulse: 65   ?Temp: (!) 97.4 ?F (36.3 ?C)   ?SpO2: 100%   ? ?Filed Weights  ? 03/02/22 0910  ?Weight: 142 lb 3.2 oz (64.5 kg)  ? ? ?GENERAL:alert, no distress and comfortable ?SKIN: No  rash.  Palms with mild hyperpigmentation ?EYES: sclera clear ?LUNGS: clear with normal breathing effort ?HEART: regular rate & rhythm, no lower extremity edema ?ABDOMEN:abdomen soft, non-tender and norma

## 2022-03-02 ENCOUNTER — Other Ambulatory Visit: Payer: Self-pay

## 2022-03-02 ENCOUNTER — Inpatient Hospital Stay (HOSPITAL_BASED_OUTPATIENT_CLINIC_OR_DEPARTMENT_OTHER): Payer: Medicare Other | Admitting: Nurse Practitioner

## 2022-03-02 ENCOUNTER — Inpatient Hospital Stay: Payer: Medicare Other

## 2022-03-02 ENCOUNTER — Encounter: Payer: Self-pay | Admitting: Nurse Practitioner

## 2022-03-02 VITALS — BP 180/97 | HR 65 | Temp 97.4°F | Wt 142.2 lb

## 2022-03-02 DIAGNOSIS — C7800 Secondary malignant neoplasm of unspecified lung: Secondary | ICD-10-CM

## 2022-03-02 DIAGNOSIS — Z95828 Presence of other vascular implants and grafts: Secondary | ICD-10-CM

## 2022-03-02 DIAGNOSIS — C20 Malignant neoplasm of rectum: Secondary | ICD-10-CM

## 2022-03-02 DIAGNOSIS — Z5111 Encounter for antineoplastic chemotherapy: Secondary | ICD-10-CM | POA: Diagnosis not present

## 2022-03-02 LAB — CMP (CANCER CENTER ONLY)
ALT: 18 U/L (ref 0–44)
AST: 24 U/L (ref 15–41)
Albumin: 3.5 g/dL (ref 3.5–5.0)
Alkaline Phosphatase: 129 U/L — ABNORMAL HIGH (ref 38–126)
Anion gap: 3 — ABNORMAL LOW (ref 5–15)
BUN: 8 mg/dL (ref 8–23)
CO2: 30 mmol/L (ref 22–32)
Calcium: 9.1 mg/dL (ref 8.9–10.3)
Chloride: 104 mmol/L (ref 98–111)
Creatinine: 0.78 mg/dL (ref 0.61–1.24)
GFR, Estimated: >60 mL/min (ref >60)
Glucose, Bld: 99 mg/dL (ref 70–99)
Potassium: 4.5 mmol/L (ref 3.5–5.1)
Sodium: 137 mmol/L (ref 135–145)
Total Bilirubin: 0.3 mg/dL (ref 0.3–1.2)
Total Protein: 6.5 g/dL (ref 6.5–8.1)

## 2022-03-02 LAB — CBC WITH DIFFERENTIAL (CANCER CENTER ONLY)
Abs Immature Granulocytes: 0 10*3/uL (ref 0.00–0.07)
Basophils Absolute: 0 10*3/uL (ref 0.0–0.1)
Basophils Relative: 1 %
Eosinophils Absolute: 0 10*3/uL (ref 0.0–0.5)
Eosinophils Relative: 2 %
HCT: 40.7 % (ref 39.0–52.0)
Hemoglobin: 13.5 g/dL (ref 13.0–17.0)
Immature Granulocytes: 0 %
Lymphocytes Relative: 30 %
Lymphs Abs: 0.6 10*3/uL — ABNORMAL LOW (ref 0.7–4.0)
MCH: 31.2 pg (ref 26.0–34.0)
MCHC: 33.2 g/dL (ref 30.0–36.0)
MCV: 94 fL (ref 80.0–100.0)
Monocytes Absolute: 0.3 10*3/uL (ref 0.1–1.0)
Monocytes Relative: 18 %
Neutro Abs: 0.9 10*3/uL — ABNORMAL LOW (ref 1.7–7.7)
Neutrophils Relative %: 49 %
Platelet Count: 136 10*3/uL — ABNORMAL LOW (ref 150–400)
RBC: 4.33 MIL/uL (ref 4.22–5.81)
RDW: 15.1 % (ref 11.5–15.5)
WBC Count: 1.9 10*3/uL — ABNORMAL LOW (ref 4.0–10.5)
nRBC: 0 % (ref 0.0–0.2)

## 2022-03-02 LAB — TOTAL PROTEIN, URINE DIPSTICK: Protein, ur: <30 mg/dL

## 2022-03-02 MED ORDER — AMLODIPINE BESYLATE 5 MG PO TABS: 5.0000 mg | ORAL_TABLET | Freq: Every day | ORAL | 0 refills | Status: DC

## 2022-03-02 MED ORDER — HEPARIN SOD (PORK) LOCK FLUSH 100 UNIT/ML IV SOLN
500.0000 [IU] | Freq: Once | INTRAVENOUS | Status: AC
  Administered 2022-03-02: 500 [IU]

## 2022-03-02 MED ORDER — SODIUM CHLORIDE 0.9% FLUSH
10.0000 mL | Freq: Once | INTRAVENOUS | Status: AC
  Administered 2022-03-02: 10 mL

## 2022-03-02 NOTE — Addendum Note (Signed)
Addended by: Alla Feeling on: 03/02/2022 01:48 PM ? ? Modules accepted: Orders ? ?

## 2022-03-06 ENCOUNTER — Telehealth: Payer: Self-pay | Admitting: Hematology

## 2022-03-06 NOTE — Telephone Encounter (Signed)
Scheduled follow-up appointments per 4/27 los. Patient's daughter is aware. ?

## 2022-03-07 ENCOUNTER — Other Ambulatory Visit: Payer: Self-pay | Admitting: Hematology

## 2022-03-07 ENCOUNTER — Encounter: Payer: Self-pay | Admitting: Hematology

## 2022-03-07 ENCOUNTER — Inpatient Hospital Stay: Payer: Medicare Other | Attending: Physician Assistant | Admitting: Hematology

## 2022-03-07 ENCOUNTER — Inpatient Hospital Stay: Payer: Medicare Other

## 2022-03-07 ENCOUNTER — Other Ambulatory Visit: Payer: Self-pay

## 2022-03-07 VITALS — BP 162/89 | HR 83 | Temp 98.4°F | Resp 18 | Ht 72.0 in | Wt 143.1 lb

## 2022-03-07 DIAGNOSIS — Z79899 Other long term (current) drug therapy: Secondary | ICD-10-CM | POA: Diagnosis not present

## 2022-03-07 DIAGNOSIS — C7800 Secondary malignant neoplasm of unspecified lung: Secondary | ICD-10-CM | POA: Insufficient documentation

## 2022-03-07 DIAGNOSIS — Z95828 Presence of other vascular implants and grafts: Secondary | ICD-10-CM

## 2022-03-07 DIAGNOSIS — K6289 Other specified diseases of anus and rectum: Secondary | ICD-10-CM | POA: Diagnosis not present

## 2022-03-07 DIAGNOSIS — R634 Abnormal weight loss: Secondary | ICD-10-CM | POA: Diagnosis not present

## 2022-03-07 DIAGNOSIS — R5383 Other fatigue: Secondary | ICD-10-CM | POA: Diagnosis not present

## 2022-03-07 DIAGNOSIS — C20 Malignant neoplasm of rectum: Secondary | ICD-10-CM

## 2022-03-07 DIAGNOSIS — Z5111 Encounter for antineoplastic chemotherapy: Secondary | ICD-10-CM | POA: Diagnosis present

## 2022-03-07 DIAGNOSIS — R030 Elevated blood-pressure reading, without diagnosis of hypertension: Secondary | ICD-10-CM | POA: Diagnosis not present

## 2022-03-07 DIAGNOSIS — Z5112 Encounter for antineoplastic immunotherapy: Secondary | ICD-10-CM | POA: Insufficient documentation

## 2022-03-07 DIAGNOSIS — Z5189 Encounter for other specified aftercare: Secondary | ICD-10-CM | POA: Insufficient documentation

## 2022-03-07 LAB — CBC WITH DIFFERENTIAL (CANCER CENTER ONLY)
Abs Immature Granulocytes: 0 10*3/uL (ref 0.00–0.07)
Basophils Absolute: 0 10*3/uL (ref 0.0–0.1)
Basophils Relative: 1 %
Eosinophils Absolute: 0.1 10*3/uL (ref 0.0–0.5)
Eosinophils Relative: 3 %
HCT: 42.5 % (ref 39.0–52.0)
Hemoglobin: 14 g/dL (ref 13.0–17.0)
Immature Granulocytes: 0 %
Lymphocytes Relative: 32 %
Lymphs Abs: 0.8 10*3/uL (ref 0.7–4.0)
MCH: 30.9 pg (ref 26.0–34.0)
MCHC: 32.9 g/dL (ref 30.0–36.0)
MCV: 93.8 fL (ref 80.0–100.0)
Monocytes Absolute: 0.5 10*3/uL (ref 0.1–1.0)
Monocytes Relative: 20 %
Neutro Abs: 1 10*3/uL — ABNORMAL LOW (ref 1.7–7.7)
Neutrophils Relative %: 44 %
Platelet Count: 145 10*3/uL — ABNORMAL LOW (ref 150–400)
RBC: 4.53 MIL/uL (ref 4.22–5.81)
RDW: 15.1 % (ref 11.5–15.5)
WBC Count: 2.4 10*3/uL — ABNORMAL LOW (ref 4.0–10.5)
nRBC: 0 % (ref 0.0–0.2)

## 2022-03-07 LAB — CMP (CANCER CENTER ONLY)
ALT: 18 U/L (ref 0–44)
AST: 25 U/L (ref 15–41)
Albumin: 3.6 g/dL (ref 3.5–5.0)
Alkaline Phosphatase: 114 U/L (ref 38–126)
Anion gap: 4 — ABNORMAL LOW (ref 5–15)
BUN: 14 mg/dL (ref 8–23)
CO2: 31 mmol/L (ref 22–32)
Calcium: 9.2 mg/dL (ref 8.9–10.3)
Chloride: 104 mmol/L (ref 98–111)
Creatinine: 0.93 mg/dL (ref 0.61–1.24)
GFR, Estimated: >60 mL/min (ref >60)
Glucose, Bld: 91 mg/dL (ref 70–99)
Potassium: 4.2 mmol/L (ref 3.5–5.1)
Sodium: 139 mmol/L (ref 135–145)
Total Bilirubin: 0.4 mg/dL (ref 0.3–1.2)
Total Protein: 6.8 g/dL (ref 6.5–8.1)

## 2022-03-07 LAB — TOTAL PROTEIN, URINE DIPSTICK: Protein, ur: NEGATIVE mg/dL

## 2022-03-07 MED ORDER — SODIUM CHLORIDE 0.9 % IV SOLN
5.0000 mg/kg | Freq: Once | INTRAVENOUS | Status: AC
  Administered 2022-03-07: 300 mg via INTRAVENOUS
  Filled 2022-03-07: qty 12

## 2022-03-07 MED ORDER — SODIUM CHLORIDE 0.9 % IV SOLN: Freq: Once | INTRAVENOUS | Status: AC

## 2022-03-07 MED ORDER — SODIUM CHLORIDE 0.9% FLUSH
10.0000 mL | Freq: Once | INTRAVENOUS | Status: AC
  Administered 2022-03-07: 10 mL

## 2022-03-07 MED ORDER — ATROPINE SULFATE 1 MG/ML IV SOLN
0.5000 mg | Freq: Once | INTRAVENOUS | Status: AC | PRN
  Administered 2022-03-07: 0.5 mg via INTRAVENOUS
  Filled 2022-03-07: qty 1

## 2022-03-07 MED ORDER — SODIUM CHLORIDE 0.9 % IV SOLN
400.0000 mg/m2 | Freq: Once | INTRAVENOUS | Status: AC
  Administered 2022-03-07: 724 mg via INTRAVENOUS
  Filled 2022-03-07: qty 36.2

## 2022-03-07 MED ORDER — SODIUM CHLORIDE 0.9 % IV SOLN
180.0000 mg/m2 | Freq: Once | INTRAVENOUS | Status: AC
  Administered 2022-03-07: 320 mg via INTRAVENOUS
  Filled 2022-03-07: qty 16

## 2022-03-07 MED ORDER — SODIUM CHLORIDE 0.9 % IV SOLN
2400.0000 mg/m2 | INTRAVENOUS | Status: DC
  Administered 2022-03-07: 4350 mg via INTRAVENOUS
  Filled 2022-03-07: qty 87

## 2022-03-07 MED ORDER — PALONOSETRON HCL INJECTION 0.25 MG/5ML
0.2500 mg | Freq: Once | INTRAVENOUS | Status: AC
  Administered 2022-03-07: 0.25 mg via INTRAVENOUS
  Filled 2022-03-07: qty 5

## 2022-03-07 MED ORDER — SODIUM CHLORIDE 0.9 % IV SOLN
10.0000 mg | Freq: Once | INTRAVENOUS | Status: AC
  Administered 2022-03-07: 10 mg via INTRAVENOUS
  Filled 2022-03-07: qty 10

## 2022-03-07 NOTE — Progress Notes (Signed)
Per Dr Burr Medico, ok to treat with ANC 1.0 and elevated BP today. ?

## 2022-03-07 NOTE — Patient Instructions (Signed)
[facility]  ? Discharge Instructions: ?Thank you for choosing [facility] to provide your oncology and hematology care.  ? ?If you have a lab appointment with the [facility], please go directly to the [facility] and check in at the registration area. ?  ?Wear comfortable clothing and clothing appropriate for easy access to any Portacath or PICC line.  ? ?We strive to give you quality time with your provider. You may need to reschedule your appointment if you arrive late (15 or more minutes).  Arriving late affects you and other patients whose appointments are after yours.  Also, if you miss three or more appointments without notifying the office, you may be dismissed from the clinic at the provider?s discretion.    ?  ?For prescription refill requests, have your pharmacy contact our office and allow 72 hours for refills to be completed.   ? ?Today you received the following chemotherapy and/or immunotherapy agents: irinotecan, fluororuracil and leucovorin    ?  ?To help prevent nausea and vomiting after your treatment, we encourage you to take your nausea medication as directed. ? ?BELOW ARE SYMPTOMS THAT SHOULD BE REPORTED IMMEDIATELY: ?*FEVER GREATER THAN 100.4 F (38 ?C) OR HIGHER ?*CHILLS OR SWEATING ?*NAUSEA AND VOMITING THAT IS NOT CONTROLLED WITH YOUR NAUSEA MEDICATION ?*UNUSUAL SHORTNESS OF BREATH ?*UNUSUAL BRUISING OR BLEEDING ?*URINARY PROBLEMS (pain or burning when urinating, or frequent urination) ?*BOWEL PROBLEMS (unusual diarrhea, constipation, pain near the anus) ?TENDERNESS IN MOUTH AND THROAT WITH OR WITHOUT PRESENCE OF ULCERS (sore throat, sores in mouth, or a toothache) ?UNUSUAL RASH, SWELLING OR PAIN  ?UNUSUAL VAGINAL DISCHARGE OR ITCHING  ? ?Items with * indicate a potential emergency and should be followed up as soon as possible or go to the Emergency Department if any problems should occur. ? ?Please show the CHEMOTHERAPY ALERT CARD or  IMMUNOTHERAPY ALERT CARD at check-in to the Emergency Department and triage nurse. ? ?Should you have questions after your visit or need to cancel or reschedule your appointment, please contact [facility]  Dept: [phone]  and follow the prompts.  Office hours are 8:00 a.m. to 4:30 p.m. Monday - Friday. Please note that voicemails left after 4:00 p.m. may not be returned until the following business day.  We are closed weekends and major holidays. You have access to a nurse at all times for urgent questions. Please call the main number to the clinic Dept: [phone] and follow the prompts. ? ? ?For any non-urgent questions, you may also contact your provider using MyChart. We now offer e-Visits for anyone 17 and older to request care online for non-urgent symptoms. For details visit [email or web address]. ?  ?Also download the MyChart app! Go to the app store, search "MyChart", open the app, select [facility], and log in with your MyChart username and password. ? ?Due to Covid, a mask is required upon entering the hospital/clinic. If you do not have a mask, one will be given to you upon arrival. For doctor visits, patients may have 1 support person aged 41 or older with them. For treatment visits, patients cannot have anyone with them due to current Covid guidelines and our immunocompromised population.  ? ?

## 2022-03-07 NOTE — Progress Notes (Signed)
?Jorge [name]   ?Telephone:(336) [phone] [phone]   ?Clinic Follow up Note  ? ?Patient Care Team: ?Pcp, No as PCP - General ? ?Date of Service:  03/07/2022 ? ?CHIEF COMPLAINT: f/u of metastatic rectal cancer ? ?CURRENT THERAPY:  ?Second-line FOLFIRI, q2weeks, started 02/22/22 ?-Bevacizumab started 11/24/21 ? ?ASSESSMENT & PLAN:  ?Jorge [name] is a 71 y.o. [name] with  ? ?1. 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 CT AP 07/23/21 showed rectal mass with no significant adenopathy or distant metastasis. Colonoscopy 08/19/21 showed a 7 cm infiltrative mass in the mid rectum (6-13cm from anal verge), pathology showed adenocarcinoma. MMR is normal ?-staging pelvic MRI 08/27/21 showed: staging at T4b; N2 likely associated with early extra mesorectal lymph node involvement; distance from tumor to internal anal sphincter is 1.2 cm; also with extramural venous involvement. He has biopsy proven lung mets ?-baseline CEA 571.84 on 09/06/21. ?-he received concurrent chemoRT with Xeloda 11/1-11/21/22 under the care of Dr. [name].  He responded clinically, no recurrent rectal bleeding, his pain has improved  ?-He began first-line CAPOX and bevacizumab on 10/14/21, taking Xeloda 1500 mg BID 2 weeks on/1 week off. He tolerated very well overall.  ?-Cycle 2 CAPOX delayed due to elevated liver enzymes, resumed with dose-reduced Xeloda upon improvement, takes Xeloda at 1000 mg a.m. and 1500 mg p.m. for 14 days. He is tolerating this dose well. ?-restaging CT CAP on 01/23/22 showed: decrease in size of pulmonary nodules; approximately 10 new liver lesions. ?-further evaluation with abdomen MRI on 02/10/22 showed: multiple indeterminate liver lesions, new compared to prior CT in 07/2021.  ?-we switched to second-line FOLFIRI, with continued beva, on 02/21/22, with dose-reduced irinotecan for cycle 1. He tolerated well  overall. ?-labs reviewed, overall stable. However, given his continued low WBC, we will schedule GCF-S injection with pump removal. Urine protein negative. ? ?2. Symptom Management: HTN, Weight loss ?-elevated BP secondary to beva ?-we started him on low-dose amlodipine at his last visit on 03/02/22. He is compliant and endorses taking daily. ?-he is slowly gaining weight back, but he hopes to gain back the weight he lost (baseline ~160 lbs) ?  ?3. Multiple colon polyps ?-He has a large 3.5 cm polyps in the hepatic flexure, biopsy showed tubular adenoma. He is scheduled for f/u with Dr. [name] on 12/13/21.  They discussed polypectomy/resection to remove it ?-In the context of his stage IV rectal cancer, he prefers to defer resection for now, which we agree ?-He had additional 4 small polyps which were removed, path showed tubular adenomas. ?  ?4. Pain and narcotics  ?-Patient states his rectal pain has resolved.  He no longer requires narcotics but does have oxycodone to use as needed for pain in feet and headache. I advised him to use tylenol for more minor pains or headaches. ?-I previously spoke with his daughter to keep an eye on his pain and meds  ?-he does not need refill his oxycodone today  ? ?  ?PLAN: ?-proceed with C2 FOLFIRI with full dose and bevacizumab today ?-add GCSF with pump d/c in 2 days, will get PA ?-lab, flush, f/u, and FOLFIRI/beva in 2 and 4 weeks as scheduled ? ? ?No problem-specific Assessment & Plan notes found for this encounter. ? ? ?SUMMARY OF ONCOLOGIC HISTORY: ?Oncology History Overview Note  ?Cancer Staging ?Rectal cancer ([place]) ?Staging form: Colon and Rectum, AJCC 8th Edition ?- Clinical stage from 08/29/2021: cT4b,  cN2, cM1 - Signed by [name], MD on 08/29/2021 ? ?  ?Rectal cancer ([place])  ?07/23/2021 Imaging  ? CT AP ? ?IMPRESSION: ?1. Appearance of the rectum likely indicates a rectal mass ?suspicious for rectal carcinoma. Inflammatory process would be less likely. Direct  visualization is suggested. ?2. Cholelithiasis without evidence of acute cholecystitis. ?3. Aortic atherosclerosis. ?  ?08/19/2021 Procedure  ? Colonoscopy, under Dr. [name] ? ?Impression: ?- Rectal tenderness, palpable rectal mass and hemorrhoids found on digital rectal exam. ?- Stool in the entire examined colon - lavaged with still inadequate clearance. ?- One 35 mm polyp at the hepatic flexure. Biopsied. Tattooed distal in case future endoscopic resection is considered - however, has larger issues with rectal mass currently as below. ?- Four, 5 to 11 mm polyps in the transverse colon and at the hepatic flexure, removed with a cold snare. Resected and retrieved. ?- Diverticulosis in the recto-sigmoid colon and in the sigmoid colon. ?- Rule out malignancy, partially obstructing tumor in the rectum and from 6 to 13 cm proximal to the anus. Biopsied. ?  ?08/19/2021 Pathology Results  ? Diagnosis ?1. Hepatic Flexure Biopsy ?- TUBULAR ADENOMA WITHOUT HIGH-GRADE DYSPLASIA OR MALIGNANCY ?2. Transverse Colon Biopsy, and hepatic flexure, polyps (4) ?- TUBULAR ADENOMA WITHOUT HIGH-GRADE DYSPLASIA OR MALIGNANCY ?- OTHER FRAGMENTS OF POLYPOID COLONIC MUCOSA WITH NO SPECIFIC HISTOPATHOLOGIC CHANGES ?- FOOD MATERIAL ?3. Rectum, biopsy ?- ADENOCARCINOMA. SEE NOTE ?  ?08/22/2021 Initial Diagnosis  ? Rectal cancer [facility]) ?  ?08/26/2021 Imaging  ? IMPRESSION: ?1. A 2.2 x 1.9 cm right middle lobe pulmonary nodule as well as a ?total of four right upper lobe pulmonary nodule and masses measuring up to 3.6 cm. Findings concerning for metastatic primary lung cancer versus less likely metastases in a patient with rectal cancer. Additional imaging evaluation or consultation with Pulmonology or Thoracic Surgery recommended. ?2. No gross hilar adenopathy, noting limited sensitivity for the ?detection of hilar adenopathy on this noncontrast study. ?3. Cholelithiasis. ?4.  Emphysema (ICD10-J43.9). ?5. At least left anterior descending  coronary artery calcifications. ?  ?08/27/2021 Imaging  ? IMPRESSION: ?Rectal adenocarcinoma T stage: T4 B ?  ?Rectal adenocarcinoma N stage: N2 disease likely associated with ?early extra mesorectal lymph node involvement. ?  ?Distance from tumor to the internal anal sphincter is 1.2 cm. ?  ?Also with extramural venous involvement as described. ?  ?08/29/2021 Cancer Staging  ? Staging form: Colon and Rectum, AJCC 8th Edition ?- Clinical stage from 08/29/2021: [name], cM1 - Signed by [name], MD on 08/29/2021 ?Stage prefix: Initial diagnosis ? ?  ?01/23/2022 Imaging  ? CT CAP w contrast IMPRESSION: ?1. Mild interval decrease in size of multiple right-sided pulmonary nodules. No new suspicious pulmonary nodule or mass. ?2. Interval development of approximately 10 new ill-defined hypoattenuating liver lesions ranging in size from approximately 5-10 mm. Imaging features suspicious for metastatic disease. MRI abdomen with and without contrast may prove helpful to further ?evaluate. ?3. Similar appearance of wall thickening in the rectum with perirectal edema. ?4. Cholelithiasis. ?5. Prostatomegaly. ?6. Aortic Atherosclerosis (ICD10-I70.0). ?  ?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 [facility])  ?10/06/2021 Initial Diagnosis  ? 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 [facility]) ? ?  ?10/14/2021 - 01/25/2022 Chemotherapy  ? Patient is on Treatment Plan : COLORECTAL CapeOx + Bevacizumab q21d  ? ?  ?  ?02/21/2022 -  Chemotherapy  ? Patient is on Treatment Plan : COLORECTAL FOLFIRI / BEVACIZUMAB Q14D  ? ?  ?  ? ? ? ?INTERVAL HISTORY:  ?  Jorge [name] is here for a follow up of metastatic rectal cancer. He was last seen by NP Lacie on 03/02/22. He presents to the clinic alone. ?He reports he did well overall with first cycle FOLFIRI. He denies any cancer-related pain, just some headaches (possibly related to his beanie/hat) and pain to the balls of his feet. He notes using oxycodone occasionally, adds he has not taken it for the  last ~3 days. ?He notes his social situation with his daughter and grandchildren has improved, causing his BP to also return to normal. ?  ?All other systems were reviewed with the patient and are negative. ? ?MED

## 2022-03-07 NOTE — Progress Notes (Signed)
Per MD inrino was bumped up to full dose at 180  mg/m2 with GSF addded on D3 ?

## 2022-03-08 ENCOUNTER — Other Ambulatory Visit: Payer: Self-pay | Admitting: Hematology

## 2022-03-09 ENCOUNTER — Other Ambulatory Visit: Payer: Self-pay

## 2022-03-09 ENCOUNTER — Inpatient Hospital Stay: Payer: Medicare Other

## 2022-03-09 DIAGNOSIS — C20 Malignant neoplasm of rectum: Secondary | ICD-10-CM

## 2022-03-09 DIAGNOSIS — Z5112 Encounter for antineoplastic immunotherapy: Secondary | ICD-10-CM | POA: Diagnosis not present

## 2022-03-09 MED ORDER — SODIUM CHLORIDE 0.9% FLUSH
10.0000 mL | INTRAVENOUS | Status: DC | PRN
  Administered 2022-03-09: 10 mL

## 2022-03-09 MED ORDER — HEPARIN SOD (PORK) LOCK FLUSH 100 UNIT/ML IV SOLN
500.0000 [IU] | Freq: Once | INTRAVENOUS | Status: AC | PRN
  Administered 2022-03-09: 500 [IU]

## 2022-03-09 MED ORDER — PEGFILGRASTIM-JMDB 6 MG/0.6ML ~~LOC~~ SOSY
6.0000 mg | PREFILLED_SYRINGE | Freq: Once | SUBCUTANEOUS | Status: AC
  Administered 2022-03-09: 6 mg via SUBCUTANEOUS
  Filled 2022-03-09: qty 0.6

## 2022-03-09 NOTE — Patient Instructions (Signed)

## 2022-03-10 ENCOUNTER — Telehealth: Payer: Self-pay

## 2022-03-10 NOTE — Telephone Encounter (Signed)
Spoke with pt's and pt's daughter via telephone regarding pt's Oxycodone.  Both (pt and daughter) confirmed that the pt is completely out of Oxycodone.  Notified [name] so a refill can be sent to pt's pharmacy. ?

## 2022-03-11 ENCOUNTER — Encounter: Payer: Self-pay | Admitting: Hematology

## 2022-03-11 MED ORDER — OXYCODONE HCL 5 MG PO TABS: 5.0000 mg | ORAL_TABLET | Freq: Four times a day (QID) | ORAL | 0 refills | Status: DC | PRN

## 2022-03-20 NOTE — Progress Notes (Signed)
?[place]   ?Telephone:(336) [phone] [phone]   ?Clinic Follow up Note  ? ?Patient Care Team: ?Pcp, No as PCP - General ?03/22/2022 ? ?CHIEF COMPLAINT: Follow up metastatic rectal cancer  ? ?SUMMARY OF ONCOLOGIC HISTORY: ?Oncology History Overview Note  ?Cancer Staging ?Rectal cancer ([place]) ?Staging form: Colon and Rectum, AJCC 8th Edition ?- Clinical stage from 08/29/2021: [name], cM1 - Signed by [name], MD on 08/29/2021 ? ?  ?Rectal cancer ([place])  ?07/23/2021 Imaging  ? CT AP ? ?IMPRESSION: ?1. Appearance of the rectum likely indicates a rectal mass ?suspicious for rectal carcinoma. Inflammatory process would be less likely. Direct visualization is suggested. ?2. Cholelithiasis without evidence of acute cholecystitis. ?3. Aortic atherosclerosis. ?  ?08/19/2021 Procedure  ? Colonoscopy, under Dr. [name] ? ?Impression: ?- Rectal tenderness, palpable rectal mass and hemorrhoids found on digital rectal exam. ?- Stool in the entire examined colon - lavaged with still inadequate clearance. ?- One 35 mm polyp at the hepatic flexure. Biopsied. Tattooed distal in case future endoscopic resection is considered - however, has larger issues with rectal mass currently as below. ?- Four, 5 to 11 mm polyps in the transverse colon and at the hepatic flexure, removed with a cold snare. Resected and retrieved. ?- Diverticulosis in the recto-sigmoid colon and in the sigmoid colon. ?- Rule out malignancy, partially obstructing tumor in the rectum and from 6 to 13 cm proximal to the anus. Biopsied. ?  ?08/19/2021 Pathology Results  ? Diagnosis ?1. Hepatic Flexure Biopsy ?- TUBULAR ADENOMA WITHOUT HIGH-GRADE DYSPLASIA OR MALIGNANCY ?2. Transverse Colon Biopsy, and hepatic flexure, polyps (4) ?- TUBULAR ADENOMA WITHOUT HIGH-GRADE DYSPLASIA OR MALIGNANCY ?- OTHER FRAGMENTS OF POLYPOID COLONIC MUCOSA WITH NO SPECIFIC HISTOPATHOLOGIC CHANGES ?- FOOD MATERIAL ?3. Rectum, biopsy ?- ADENOCARCINOMA. SEE  NOTE ?  ?08/22/2021 Initial Diagnosis  ? Rectal cancer [facility]) ?  ?08/26/2021 Imaging  ? IMPRESSION: ?1. A 2.2 x 1.9 cm right middle lobe pulmonary nodule as well as a ?total of four right upper lobe pulmonary nodule and masses measuring up to 3.6 cm. Findings concerning for metastatic primary lung cancer versus less likely metastases in a patient with rectal cancer. Additional imaging evaluation or consultation with Pulmonology or Thoracic Surgery recommended. ?2. No gross hilar adenopathy, noting limited sensitivity for the ?detection of hilar adenopathy on this noncontrast study. ?3. Cholelithiasis. ?4.  Emphysema (ICD10-J43.9). ?5. At least left anterior descending coronary artery calcifications. ?  ?08/27/2021 Imaging  ? IMPRESSION: ?Rectal adenocarcinoma T stage: T4 B ?  ?Rectal adenocarcinoma N stage: N2 disease likely associated with ?early extra mesorectal lymph node involvement. ?  ?Distance from tumor to the internal anal sphincter is 1.2 cm. ?  ?Also with extramural venous involvement as described. ?  ?08/29/2021 Cancer Staging  ? Staging form: Colon and Rectum, AJCC 8th Edition ?- Clinical stage from 08/29/2021: [name], cM1 - Signed by [name], MD on 08/29/2021 ?Stage prefix: Initial diagnosis ? ?  ?01/23/2022 Imaging  ? CT CAP w contrast IMPRESSION: ?1. Mild interval decrease in size of multiple right-sided pulmonary nodules. No new suspicious pulmonary nodule or mass. ?2. Interval development of approximately 10 new ill-defined hypoattenuating liver lesions ranging in size from approximately 5-10 mm. Imaging features suspicious for metastatic disease. MRI abdomen with and without contrast may prove helpful to further ?evaluate. ?3. Similar appearance of wall thickening in the rectum with perirectal edema. ?4. Cholelithiasis. ?5. Prostatomegaly. ?6. Aortic Atherosclerosis (ICD10-I70.0). ?  ?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 [facility])  ?10/06/2021  Initial Diagnosis  ? 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 [facility]) ? ?  ?10/14/2021 - 01/25/2022 Chemotherapy  ? Patient is on Treatment Plan : COLORECTAL CapeOx + Bevacizumab q21d  ? ?   ?02/21/2022 -  Chemotherapy  ? Patient is on Treatment Plan : COLORECTAL FOLFIRI / BEVACIZUMAB Q14D  ? ?   ? ? ?CURRENT THERAPY: Second line FOLFIRI q2 weeks starting 02/22/22; bevacizumab started 11/24/21 ? ?INTERVAL HISTORY: Jorge [name] returns for follow up and treatment as scheduled. Last seen by [name] 03/07/22 and completed cycle 2 of FOLFIRI/beva.  He feels well in general, remains very active walks 2 miles a day and is trying to get stronger with muscle toning.  He takes care of his 2 great granddaughters ages 12 and 2 for up to 4 hours most days of the week.  He has intermittent pain in his feet, he had this many years ago as he played sports growing up but has "kicked up again."  When he takes oxycodone this resolves.  He also mention to me previously a headache where his hat sits but this is improving.  Energy and appetite are normal, bowels moving well.  Denies nausea/vomiting, bleeding, mucositis, rash, fever, chills, cough, chest pain, dyspnea, leg edema, or any other new specific complaints. ? ? ?MEDICAL HISTORY:  ?Past Medical History:  ?Diagnosis Date  ? Anemia   ? Rectal adenocarcinoma metastatic to intrapelvic lymph node ([place]) 08/19/2021  ? ? ?SURGICAL HISTORY: ?Past Surgical History:  ?Procedure Laterality Date  ? BRONCHIAL BIOPSY  09/27/2021  ? Procedure: BRONCHIAL BIOPSIES;  Surgeon: Garner Nash, DO;  Location: [place];  Service: Pulmonary;;  ? BRONCHIAL BRUSHINGS  09/27/2021  ? Procedure: BRONCHIAL BRUSHINGS;  Surgeon: Garner Nash, DO;  Location: [facility];  Service: Pulmonary;;  ? BRONCHIAL NEEDLE ASPIRATION BIOPSY  09/27/2021  ? Procedure: BRONCHIAL NEEDLE ASPIRATION BIOPSIES;  Surgeon: Garner Nash, DO;  Location: [facility];  Service: Pulmonary;;  ? COLONOSCOPY    ? IR IMAGING GUIDED PORT INSERTION  10/24/2021  ? left breast  surgery Left 1968  ? VIDEO BRONCHOSCOPY WITH RADIAL ENDOBRONCHIAL ULTRASOUND  09/27/2021  ? Procedure: RADIAL ENDOBRONCHIAL ULTRASOUND;  Surgeon: Garner Nash, DO;  Location: [facility];  Service: Pulmonary;;  ? ? ?I have reviewed the social history and family history with the patient and they are unchanged from previous note. ? ?ALLERGIES:  has No Known Allergies. ? ?MEDICATIONS:  ?Current Outpatient Medications  ?Medication Sig Dispense Refill  ? amLODipine (NORVASC) 5 MG tablet Take 1 tablet (5 mg total) by mouth daily. 30 tablet 0  ? carbonyl iron (FEOSOL) 45 MG TABS tablet Take 45 mg by mouth daily.    ? lidocaine-prilocaine (EMLA) cream Apply 1 application topically as needed. 30 g 2  ? Multiple Vitamin (MULTIVITAMIN WITH MINERALS) TABS tablet Take 1 tablet by mouth daily.    ? ondansetron (ZOFRAN) 8 MG tablet Take 1 tablet (8 mg total) by mouth every 8 (eight) hours as needed for nausea or vomiting. 30 tablet 0  ? oxyCODONE (OXY IR/ROXICODONE) 5 MG immediate release tablet Take 1 tablet (5 mg total) by mouth every 6 (six) hours as needed for severe pain. 90 tablet 0  ? ?No current facility-administered medications for this visit.  ? ?Facility-Administered Medications Ordered in Other Visits  ?Medication Dose Route Frequency Provider Last Rate Last Admin  ? fluorouracil (ADRUCIL) 4,350 mg in sodium chloride 0.9 % 63 mL chemo infusion  2,400 mg/m2 (Treatment Plan Recorded) Intravenous  1 day or 1 dose [name], MD   4,350 mg at 03/22/22 1336  ? ? ?PHYSICAL EXAMINATION: ?ECOG PERFORMANCE STATUS: 0 - Asymptomatic ? ?Vitals:  ? 03/22/22 1026  ?BP: (!) 153/83  ?Pulse: 77  ?Resp: 15  ?Temp: 98.4 ?F (36.9 ?C)  ?SpO2: 100%  ? ?Filed Weights  ? 03/22/22 1026  ?Weight: 145 lb (65.8 kg)  ? ? ?GENERAL:alert, no distress and comfortable ?SKIN: No rash ?EYES: sclera clear ?LUNGS:  normal breathing effort ?HEART:  no lower extremity edema ?NEURO: alert & oriented x 3 with fluent speech, no focal motor deficits ?PAC  without erythema ? ?LABORATORY DATA:  ?I have reviewed the data as listed ? ?  Latest Ref Rng & Units 03/22/2022  ? 10:10 AM 03/07/2022  ?  9:12 AM 03/02/2022  ?  9:55 AM  ?CBC  ?WBC 4.0 - 10.5 K/uL 5.1   2.4   1.9    ?[place]

## 2022-03-21 MED FILL — Dexamethasone Sodium Phosphate Inj 100 MG/10ML: INTRAMUSCULAR | Qty: 1 | Status: AC

## 2022-03-22 ENCOUNTER — Other Ambulatory Visit: Payer: Self-pay

## 2022-03-22 ENCOUNTER — Inpatient Hospital Stay: Payer: Medicare Other

## 2022-03-22 ENCOUNTER — Inpatient Hospital Stay (HOSPITAL_BASED_OUTPATIENT_CLINIC_OR_DEPARTMENT_OTHER): Payer: Medicare Other | Admitting: Nurse Practitioner

## 2022-03-22 ENCOUNTER — Encounter: Payer: Self-pay | Admitting: Nurse Practitioner

## 2022-03-22 VITALS — BP 153/83 | HR 77 | Temp 98.4°F | Resp 15 | Wt 145.0 lb

## 2022-03-22 DIAGNOSIS — Z95828 Presence of other vascular implants and grafts: Secondary | ICD-10-CM

## 2022-03-22 DIAGNOSIS — C7800 Secondary malignant neoplasm of unspecified lung: Secondary | ICD-10-CM

## 2022-03-22 DIAGNOSIS — C20 Malignant neoplasm of rectum: Secondary | ICD-10-CM

## 2022-03-22 DIAGNOSIS — Z5112 Encounter for antineoplastic immunotherapy: Secondary | ICD-10-CM | POA: Diagnosis not present

## 2022-03-22 LAB — CMP (CANCER CENTER ONLY)
ALT: 15 U/L (ref 0–44)
AST: 20 U/L (ref 15–41)
Albumin: 3.6 g/dL (ref 3.5–5.0)
Alkaline Phosphatase: 119 U/L (ref 38–126)
Anion gap: 3 — ABNORMAL LOW (ref 5–15)
BUN: 11 mg/dL (ref 8–23)
CO2: 29 mmol/L (ref 22–32)
Calcium: 8.9 mg/dL (ref 8.9–10.3)
Chloride: 104 mmol/L (ref 98–111)
Creatinine: 0.94 mg/dL (ref 0.61–1.24)
GFR, Estimated: >60 mL/min (ref >60)
Glucose, Bld: 93 mg/dL (ref 70–99)
Potassium: 4.6 mmol/L (ref 3.5–5.1)
Sodium: 136 mmol/L (ref 135–145)
Total Bilirubin: 0.5 mg/dL (ref 0.3–1.2)
Total Protein: 7 g/dL (ref 6.5–8.1)

## 2022-03-22 LAB — CBC WITH DIFFERENTIAL (CANCER CENTER ONLY)
Abs Immature Granulocytes: 0.03 10*3/uL (ref 0.00–0.07)
Basophils Absolute: 0.1 10*3/uL (ref 0.0–0.1)
Basophils Relative: 1 %
Eosinophils Absolute: 0.1 10*3/uL (ref 0.0–0.5)
Eosinophils Relative: 2 %
HCT: 40.9 % (ref 39.0–52.0)
Hemoglobin: 13.9 g/dL (ref 13.0–17.0)
Immature Granulocytes: 1 %
Lymphocytes Relative: 15 %
Lymphs Abs: 0.8 10*3/uL (ref 0.7–4.0)
MCH: 31.9 pg (ref 26.0–34.0)
MCHC: 34 g/dL (ref 30.0–36.0)
MCV: 93.8 fL (ref 80.0–100.0)
Monocytes Absolute: 0.8 10*3/uL (ref 0.1–1.0)
Monocytes Relative: 15 %
Neutro Abs: 3.4 10*3/uL (ref 1.7–7.7)
Neutrophils Relative %: 66 %
Platelet Count: 172 10*3/uL (ref 150–400)
RBC: 4.36 MIL/uL (ref 4.22–5.81)
RDW: 14.7 % (ref 11.5–15.5)
WBC Count: 5.1 10*3/uL (ref 4.0–10.5)
nRBC: 0 % (ref 0.0–0.2)

## 2022-03-22 LAB — CEA (IN HOUSE-CHCC): CEA (CHCC-In House): 23.09 ng/mL — ABNORMAL HIGH (ref 0.00–5.00)

## 2022-03-22 LAB — TOTAL PROTEIN, URINE DIPSTICK: Protein, ur: NEGATIVE mg/dL

## 2022-03-22 MED ORDER — SODIUM CHLORIDE 0.9 % IV SOLN
400.0000 mg/m2 | Freq: Once | INTRAVENOUS | Status: AC
  Administered 2022-03-22: 724 mg via INTRAVENOUS
  Filled 2022-03-22: qty 36.2

## 2022-03-22 MED ORDER — SODIUM CHLORIDE 0.9% FLUSH
10.0000 mL | Freq: Once | INTRAVENOUS | Status: AC
  Administered 2022-03-22: 10 mL

## 2022-03-22 MED ORDER — PALONOSETRON HCL INJECTION 0.25 MG/5ML
0.2500 mg | Freq: Once | INTRAVENOUS | Status: AC
  Administered 2022-03-22: 0.25 mg via INTRAVENOUS
  Filled 2022-03-22: qty 5

## 2022-03-22 MED ORDER — SODIUM CHLORIDE 0.9 % IV SOLN: Freq: Once | INTRAVENOUS | Status: AC

## 2022-03-22 MED ORDER — ATROPINE SULFATE 1 MG/ML IV SOLN
0.5000 mg | Freq: Once | INTRAVENOUS | Status: AC | PRN
  Administered 2022-03-22: 0.5 mg via INTRAVENOUS
  Filled 2022-03-22: qty 1

## 2022-03-22 MED ORDER — SODIUM CHLORIDE 0.9 % IV SOLN
5.0000 mg/kg | Freq: Once | INTRAVENOUS | Status: AC
  Administered 2022-03-22: 300 mg via INTRAVENOUS
  Filled 2022-03-22: qty 12

## 2022-03-22 MED ORDER — SODIUM CHLORIDE 0.9 % IV SOLN
180.0000 mg/m2 | Freq: Once | INTRAVENOUS | Status: AC
  Administered 2022-03-22: 320 mg via INTRAVENOUS
  Filled 2022-03-22: qty 15

## 2022-03-22 MED ORDER — SODIUM CHLORIDE 0.9 % IV SOLN
10.0000 mg | Freq: Once | INTRAVENOUS | Status: AC
  Administered 2022-03-22: 10 mg via INTRAVENOUS
  Filled 2022-03-22: qty 10

## 2022-03-22 MED ORDER — SODIUM CHLORIDE 0.9 % IV SOLN
2400.0000 mg/m2 | INTRAVENOUS | Status: DC
  Administered 2022-03-22: 4350 mg via INTRAVENOUS
  Filled 2022-03-22: qty 87

## 2022-03-22 NOTE — Patient Instructions (Signed)
[facility]  Discharge Instructions: ?Thank you for choosing [facility] to provide your oncology and hematology care.  ? ?If you have a lab appointment with the [facility], please go directly to the [facility] and check in at the registration area. ?  ?Wear comfortable clothing and clothing appropriate for easy access to any Portacath or PICC line.  ? ?We strive to give you quality time with your provider. You may need to reschedule your appointment if you arrive late (15 or more minutes).  Arriving late affects you and other patients whose appointments are after yours.  Also, if you miss three or more appointments without notifying the office, you may be dismissed from the clinic at the provider?s discretion.    ?  ?For prescription refill requests, have your pharmacy contact our office and allow 72 hours for refills to be completed.   ? ?Today you received the following chemotherapy and/or immunotherapy agents: Bevacizumab and irinotecan    ?  ?To help prevent nausea and vomiting after your treatment, we encourage you to take your nausea medication as directed. ? ?BELOW ARE SYMPTOMS THAT SHOULD BE REPORTED IMMEDIATELY: ?*FEVER GREATER THAN 100.4 F (38 ?C) OR HIGHER ?*CHILLS OR SWEATING ?*NAUSEA AND VOMITING THAT IS NOT CONTROLLED WITH YOUR NAUSEA MEDICATION ?*UNUSUAL SHORTNESS OF BREATH ?*UNUSUAL BRUISING OR BLEEDING ?*URINARY PROBLEMS (pain or burning when urinating, or frequent urination) ?*BOWEL PROBLEMS (unusual diarrhea, constipation, pain near the anus) ?TENDERNESS IN MOUTH AND THROAT WITH OR WITHOUT PRESENCE OF ULCERS (sore throat, sores in mouth, or a toothache) ?UNUSUAL RASH, SWELLING OR PAIN  ?UNUSUAL VAGINAL DISCHARGE OR ITCHING  ? ?Items with * indicate a potential emergency and should be followed up as soon as possible or go to the Emergency Department if any problems should occur. ? ?Please show the CHEMOTHERAPY ALERT CARD or IMMUNOTHERAPY ALERT  CARD at check-in to the Emergency Department and triage nurse. ? ?Should you have questions after your visit or need to cancel or reschedule your appointment, please contact [facility]  Dept: [phone]  and follow the prompts.  Office hours are 8:00 a.m. to 4:30 p.m. Monday - Friday. Please note that voicemails left after 4:00 p.m. may not be returned until the following business day.  We are closed weekends and major holidays. You have access to a nurse at all times for urgent questions. Please call the main number to the clinic Dept: [phone] and follow the prompts. ? ? ?For any non-urgent questions, you may also contact your provider using MyChart. We now offer e-Visits for anyone 41 and older to request care online for non-urgent symptoms. For details visit [email or web address]. ?  ?Also download the MyChart app! Go to the app store, search "MyChart", open the app, select [facility], and log in with your MyChart username and password. ? ?Due to Covid, a mask is required upon entering the hospital/clinic. If you do not have a mask, one will be given to you upon arrival. For doctor visits, patients may have 1 support person aged 19 or older with them. For treatment visits, patients cannot have anyone with them due to current Covid guidelines and our immunocompromised population.  ? ?

## 2022-03-24 ENCOUNTER — Other Ambulatory Visit: Payer: Self-pay

## 2022-03-24 ENCOUNTER — Inpatient Hospital Stay: Payer: Medicare Other

## 2022-03-24 VITALS — BP 148/81 | HR 93 | Temp 98.2°F | Resp 16

## 2022-03-24 DIAGNOSIS — Z95828 Presence of other vascular implants and grafts: Secondary | ICD-10-CM

## 2022-03-24 DIAGNOSIS — Z5112 Encounter for antineoplastic immunotherapy: Secondary | ICD-10-CM | POA: Diagnosis not present

## 2022-03-24 MED ORDER — HEPARIN SOD (PORK) LOCK FLUSH 100 UNIT/ML IV SOLN
500.0000 [IU] | Freq: Once | INTRAVENOUS | Status: AC
  Administered 2022-03-24: 500 [IU]

## 2022-03-24 MED ORDER — SODIUM CHLORIDE 0.9% FLUSH
10.0000 mL | Freq: Once | INTRAVENOUS | Status: AC
  Administered 2022-03-24: 10 mL

## 2022-03-28 ENCOUNTER — Telehealth: Payer: Self-pay | Admitting: Hematology

## 2022-03-28 NOTE — Telephone Encounter (Signed)
Scheduled follow-up appointments per 5/17 los. Patient's daughter is aware. 

## 2022-03-30 ENCOUNTER — Other Ambulatory Visit: Payer: Self-pay | Admitting: Nurse Practitioner

## 2022-03-30 NOTE — Telephone Encounter (Signed)
Pt getting Bevacizumab (Avastin)

## 2022-03-31 ENCOUNTER — Other Ambulatory Visit: Payer: Self-pay | Admitting: Hematology

## 2022-03-31 ENCOUNTER — Other Ambulatory Visit: Payer: Self-pay | Admitting: Nurse Practitioner

## 2022-03-31 MED ORDER — OXYCODONE HCL 5 MG PO TABS: 5.0000 mg | ORAL_TABLET | Freq: Four times a day (QID) | ORAL | 0 refills | Status: DC | PRN

## 2022-04-02 NOTE — Progress Notes (Unsigned)
[place]   Telephone:(336) [phone] Fax:(336) [phone]   Clinic Follow up Note   Patient Care Team: Pcp, No as PCP - General 04/04/2022  CHIEF COMPLAINT: Follow up metastatic rectal cancer   SUMMARY OF ONCOLOGIC HISTORY: Oncology History Overview Note  Cancer Staging Rectal cancer [facility]) Staging form: Colon and Rectum, AJCC 8th Edition - Clinical stage from 08/29/2021: [name], cM1 - Signed by [name], MD on 08/29/2021    Rectal cancer ([place])  07/23/2021 Imaging   CT AP  IMPRESSION: 1. Appearance of the rectum likely indicates a rectal mass suspicious for rectal carcinoma. Inflammatory process would be less likely. Direct visualization is suggested. 2. Cholelithiasis without evidence of acute cholecystitis. 3. Aortic atherosclerosis.   08/19/2021 Procedure   Colonoscopy, under Dr. [name]  Impression: - Rectal tenderness, palpable rectal mass and hemorrhoids found on digital rectal exam. - Stool in the entire examined colon - lavaged with still inadequate clearance. - One 35 mm polyp at the hepatic flexure. Biopsied. Tattooed distal in case future endoscopic resection is considered - however, has larger issues with rectal mass currently as below. - Four, 5 to 11 mm polyps in the transverse colon and at the hepatic flexure, removed with a cold snare. Resected and retrieved. - Diverticulosis in the recto-sigmoid colon and in the sigmoid colon. - Rule out malignancy, partially obstructing tumor in the rectum and from 6 to 13 cm proximal to the anus. Biopsied.   08/19/2021 Pathology Results   Diagnosis 1. Hepatic Flexure Biopsy - TUBULAR ADENOMA WITHOUT HIGH-GRADE DYSPLASIA OR MALIGNANCY 2. Transverse Colon Biopsy, and hepatic flexure, polyps (4) - TUBULAR ADENOMA WITHOUT HIGH-GRADE DYSPLASIA OR MALIGNANCY - OTHER FRAGMENTS OF POLYPOID COLONIC MUCOSA WITH NO SPECIFIC HISTOPATHOLOGIC CHANGES - FOOD MATERIAL 3. Rectum, biopsy - ADENOCARCINOMA. SEE  NOTE   08/22/2021 Initial Diagnosis   Rectal cancer ([place])   08/26/2021 Imaging   IMPRESSION: 1. A 2.2 x 1.9 cm right middle lobe pulmonary nodule as well as a total of four right upper lobe pulmonary nodule and masses measuring up to 3.6 cm. Findings concerning for metastatic primary lung cancer versus less likely metastases in a patient with rectal cancer. Additional imaging evaluation or consultation with Pulmonology or Thoracic Surgery recommended. 2. No gross hilar adenopathy, noting limited sensitivity for the detection of hilar adenopathy on this noncontrast study. 3. Cholelithiasis. 4.  Emphysema (ICD10-J43.9). 5. At least left anterior descending coronary artery calcifications.   08/27/2021 Imaging   IMPRESSION: Rectal adenocarcinoma T stage: T4 B   Rectal adenocarcinoma N stage: N2 disease likely associated with early extra mesorectal lymph node involvement.   Distance from tumor to the internal anal sphincter is 1.2 cm.   Also with extramural venous involvement as described.   08/29/2021 Cancer Staging   Staging form: Colon and Rectum, AJCC 8th Edition - Clinical stage from 08/29/2021: [name], cM1 - Signed by [name], MD on 08/29/2021 Stage prefix: Initial diagnosis    01/23/2022 Imaging   CT CAP w contrast IMPRESSION: 1. Mild interval decrease in size of multiple right-sided pulmonary nodules. No new suspicious pulmonary nodule or mass. 2. Interval development of approximately 10 new ill-defined hypoattenuating liver lesions ranging in size from approximately 5-10 mm. Imaging features suspicious for metastatic disease. MRI abdomen with and without contrast may prove helpful to further evaluate. 3. Similar appearance of wall thickening in the rectum with perirectal edema. 4. Cholelithiasis. 5. Prostatomegaly. 6. Aortic Atherosclerosis (ICD10-I70.0).   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 Anello returns for follow up and treatment as scheduled. Last seen by me 03/22/22 and completed cycle 3 FOLFIRI and continues bevacizumab. GCSF was not given on day 3 as ordered.  He is doing well overall.  He has cold sensitivity and aching in the feet that lasts for a week or more.  This is affecting how much he can walk.  He is wondering if we can reduce the dose.  Denies neuropathy in his hands.  Light headaches are stable.  He is eating and drinking.  Iron made his stools dark, denies bleeding.  Bowels moving normally.  Denies new or worsening pain, fever, chills, cough, chest pain, dyspnea, or other new specific complaints.  All other systems were reviewed with the patient and are negative.  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 ([place]) 08/19/2021    SURGICAL HISTORY: Past Surgical History:  Procedure Laterality Date   BRONCHIAL BIOPSY  09/27/2021   Procedure: BRONCHIAL BIOPSIES;  Surgeon: Garner Nash, DO;  Location: [place] ENDOSCOPY;  Service: Pulmonary;;   BRONCHIAL BRUSHINGS  09/27/2021   Procedure: BRONCHIAL BRUSHINGS;  Surgeon: Garner Nash, DO;  Location: [place] [facility]COPY;  Service: Pulmonary;;   BRONCHIAL NEEDLE ASPIRATION BIOPSY  09/27/2021   Procedure: BRONCHIAL NEEDLE ASPIRATION BIOPSIES;  Surgeon: Garner Nash, DO;  Location: [facility];  Service: Pulmonary;;   COLONOSCOPY     IR IMAGING GUIDED PORT INSERTION  10/24/2021   left breast surgery Left 1968   VIDEO BRONCHOSCOPY WITH RADIAL ENDOBRONCHIAL ULTRASOUND  09/27/2021    Procedure: RADIAL ENDOBRONCHIAL ULTRASOUND;  Surgeon: Garner Nash, DO;  Location: [facility]COPY;  Service: Pulmonary;;    I have reviewed the social history and family history with the patient and they are unchanged from previous note.  ALLERGIES:  has No Known Allergies.  MEDICATIONS:  Current Outpatient Medications  Medication Sig Dispense Refill   amLODipine (NORVASC) 5 MG tablet TAKE 1 TABLET (5 MG TOTAL) BY MOUTH DAILY. 30 tablet 0   carbonyl iron (FEOSOL) 45 MG TABS tablet Take 45 mg by mouth daily.     lidocaine-prilocaine (EMLA) cream Apply 1 application topically as needed. 30 g 2   Multiple Vitamin (MULTIVITAMIN WITH MINERALS) TABS tablet Take 1 tablet by mouth daily.     ondansetron (ZOFRAN) 8 MG tablet Take 1 tablet (8 mg total) by mouth every 8 (eight) hours as needed for nausea or vomiting. 30 tablet 0   oxyCODONE (OXY IR/ROXICODONE) 5 MG immediate release tablet Take 1 tablet (5 mg total) by mouth every 6 (six) hours as needed for severe pain. 90 tablet 0   No current facility-administered medications for this visit.    PHYSICAL EXAMINATION: ECOG PERFORMANCE STATUS: 1 - Symptomatic but completely ambulatory  Vitals:   04/04/22 1238  BP: (!) 161/93  Pulse: 70  Temp: 98.2 F (36.8 C)  SpO2: 98%   Filed Weights   04/04/22 1238  Weight: 145 lb 6.4 oz (66 kg)  GENERAL:alert, no distress and comfortable SKIN: No rash EYES: sclera clear LUNGS:  normal breathing effort NEURO: alert & oriented x 3 with fluent speech, no focal motor deficits PAC without erythema  LABORATORY DATA:  I have reviewed the data as listed    Latest Ref Rng & Units 04/04/2022   12:23 PM 03/22/2022   10:10 AM 03/07/2022    9:12 AM  CBC  WBC 4.0 - 10.5 K/uL 3.8   5.1   2.4    Hemoglobin 13.0 - 17.0 g/dL 14.0   13.9   14.0    Hematocrit 39.0 - 52.0 % 41.3   40.9   42.5    Platelets 150 - 400 K/uL 232   172   145          Latest Ref Rng & Units 04/04/2022   12:23 PM 03/22/2022    10:10 AM 03/07/2022    9:12 AM  CMP  Glucose 70 - 99 mg/dL 97   93   91    BUN 8 - 23 mg/dL '12   11   14    ' Creatinine 0.61 - 1.24 mg/dL 0.82   0.94   0.93    Sodium 135 - 145 mmol/L 136   136   139    Potassium 3.5 - 5.1 mmol/L 4.2   4.6   4.2    Chloride 98 - 111 mmol/L 104   104   104    CO2 22 - 32 mmol/L '29   29   31    ' Calcium 8.9 - 10.3 mg/dL 9.3   8.9   9.2    Total Protein 6.5 - 8.1 g/dL 7.0   7.0   6.8    Total Bilirubin 0.3 - 1.2 mg/dL 0.4   0.5   0.4    Alkaline Phos 38 - 126 U/L 115   119   114    AST 15 - 41 U/L '24   20   25    ' ALT 0 - 44 U/L '17   15   18        ' RADIOGRAPHIC STUDIES: I have personally reviewed the radiological images as listed and agreed with the findings in the report. No results found.   ASSESSMENT & PLAN: KALYN DIMATTIA is a 71 y.o. male with    1. Rectal Adenocarcinoma, [phone] with lung metastasis, MMR normal  -Presented with rectal bleeding and frequent bowel movement. CT AP 07/23/21 showed rectal mass with no significant adenopathy or distant metastasis. Colonoscopy 08/19/21 showed a 7 cm infiltrative mass in the mid rectum (6-13cm from anal verge), pathology showed adenocarcinoma. MMR is normal -staging pelvic MRI 08/27/21 showed: staging at T4b; N2 likely associated with early extra mesorectal lymph node involvement; distance from tumor to internal anal sphincter is 1.2 cm; also with extramural venous involvement. -baseline CEA 571.84 on 09/06/21. -he received concurrent chemoRT with Xeloda 11/1-11/21/22 under the care of Dr. [name].  He responded clinically, no recurrent rectal bleeding, his pain has improved  -CEA has decreased on treatment -he underwent bronchscopy and biopsy of the RUL and RML masses on 09/27/21, which were consistent with metastatic rectal cancer.  -He began first-line CAPOX and bevacizumab on 10/14/21, taking Xeloda 1500 mg BID 2 weeks on/1 week off. He tolerated very well overall. C2 held for transaminitis.  Cycle 4  oxaliplatin dose reduced for cytopenias.  Otherwise tolerated well -Restaging CT CAP 01/23/2022 showed decrease in size of pulmonary nodules but showed approximately 10  new small liver lesions -Further evaluation with MR abdomen/7/23 showed multiple indeterminate liver lesions, new compared to prior CT therefore concerning for metastatic disease.  This had not been biopsy-proven -He changed to second line FOLFIRI and bevacizumab with dose reduced irinotecan with cycle 1 on 02/21/2022, tolerated well and increased to full dose on C2.  CEA has decreased -Mr. Scheck appears stable. S/p 3 cycles FOLFIRI and continues bevacizumab.  He tolerates treatment well overall with slight headaches and neuropathy which is now limiting his ambulation.  Side effects are well managed with supportive care at home.  He is able to recover and function well.  There is no clinical evidence of disease progression. -Labs reviewed, adequate to proceed with cycle 4 FOLFIRI and continue bevacizumab.  I will dose reduce irinotecan for neuropathy. -Restage after cycle 5 or 6 -Follow-up in 2 weeks with cycle   2. Symptom Management: Rectal pain, weight loss, fatigue, elevated blood pressure -Secondary to rectal cancer and treatment -Improved after chemo RT -was taking MS contin sporadically so we stopped it -takes oxycodone PRN, not daily per pt -He is gaining weight, continue follow-up with dietitian -He has had persistently elevated BP in clinic lately.  He notes stress at home.  I started amlodipine, BP slightly improved   3. Mild anemia from rectal bleeding, probable iron deficiency -He has started oral iron, tolerating well.  We will continue. -hgb overall stable, mild anemia at 11-12.8 range -Anemia resolved as of 02/15/2022 -He developed dark stools on iron, he can reduce to MWF -Continue monitoring   4. Pulmonary nodules/metastatic rectal adenocarcinoma -Seen on chest CT 08/26/21 showing 2.2 cm RML nodule and four  RUL nodules/masses measuring up to 3.6 cm. Findings concerning for metastatic primary lung cancer. -He met with Dr. Valeta Harms who performed bronchoscopy. The RML and RUL lesions were positive for metastatic rectal adenocarcinoma.   -He denies any chest pain, shortness of breath, cough, or hemoptysis  PLAN: -labs reviewed -cycle 4 FOLFIRI and beva 6/1 and GCSF with pump d/c on day 3 -Dose reduce irinotecan to 160 mg/m2 for neuropathy  -f/up and cycle 5 in 2 weeks -Restage after cycle 5 or 6 -Reduce oral iron to 1 tab MWF  All questions were answered. The patient knows to call the clinic with any problems, questions or concerns. No barriers to learning were detected.      Alla Feeling, NP 04/04/22

## 2022-04-04 ENCOUNTER — Other Ambulatory Visit: Payer: Self-pay

## 2022-04-04 ENCOUNTER — Inpatient Hospital Stay (HOSPITAL_BASED_OUTPATIENT_CLINIC_OR_DEPARTMENT_OTHER): Payer: Medicare Other | Admitting: Nurse Practitioner

## 2022-04-04 ENCOUNTER — Encounter: Payer: Self-pay | Admitting: Nurse Practitioner

## 2022-04-04 ENCOUNTER — Inpatient Hospital Stay: Payer: Medicare Other

## 2022-04-04 VITALS — BP 161/93 | HR 70 | Temp 98.2°F | Wt 145.4 lb

## 2022-04-04 DIAGNOSIS — C20 Malignant neoplasm of rectum: Secondary | ICD-10-CM | POA: Diagnosis not present

## 2022-04-04 DIAGNOSIS — Z95828 Presence of other vascular implants and grafts: Secondary | ICD-10-CM

## 2022-04-04 DIAGNOSIS — Z5112 Encounter for antineoplastic immunotherapy: Secondary | ICD-10-CM | POA: Diagnosis not present

## 2022-04-04 DIAGNOSIS — C7800 Secondary malignant neoplasm of unspecified lung: Secondary | ICD-10-CM | POA: Diagnosis not present

## 2022-04-04 LAB — CBC WITH DIFFERENTIAL (CANCER CENTER ONLY)
Abs Immature Granulocytes: 0.01 10*3/uL (ref 0.00–0.07)
Basophils Absolute: 0 10*3/uL (ref 0.0–0.1)
Basophils Relative: 1 %
Eosinophils Absolute: 0.2 10*3/uL (ref 0.0–0.5)
Eosinophils Relative: 5 %
HCT: 41.3 % (ref 39.0–52.0)
Hemoglobin: 14 g/dL (ref 13.0–17.0)
Immature Granulocytes: 0 %
Lymphocytes Relative: 23 %
Lymphs Abs: 0.9 10*3/uL (ref 0.7–4.0)
MCH: 31.7 pg (ref 26.0–34.0)
MCHC: 33.9 g/dL (ref 30.0–36.0)
MCV: 93.7 fL (ref 80.0–100.0)
Monocytes Absolute: 0.8 10*3/uL (ref 0.1–1.0)
Monocytes Relative: 22 %
Neutro Abs: 1.8 10*3/uL (ref 1.7–7.7)
Neutrophils Relative %: 49 %
Platelet Count: 232 10*3/uL (ref 150–400)
RBC: 4.41 MIL/uL (ref 4.22–5.81)
RDW: 14.8 % (ref 11.5–15.5)
WBC Count: 3.8 10*3/uL — ABNORMAL LOW (ref 4.0–10.5)
nRBC: 0 % (ref 0.0–0.2)

## 2022-04-04 LAB — CMP (CANCER CENTER ONLY)
ALT: 17 U/L (ref 0–44)
AST: 24 U/L (ref 15–41)
Albumin: 3.6 g/dL (ref 3.5–5.0)
Alkaline Phosphatase: 115 U/L (ref 38–126)
Anion gap: 3 — ABNORMAL LOW (ref 5–15)
BUN: 12 mg/dL (ref 8–23)
CO2: 29 mmol/L (ref 22–32)
Calcium: 9.3 mg/dL (ref 8.9–10.3)
Chloride: 104 mmol/L (ref 98–111)
Creatinine: 0.82 mg/dL (ref 0.61–1.24)
GFR, Estimated: >60 mL/min (ref >60)
Glucose, Bld: 97 mg/dL (ref 70–99)
Potassium: 4.2 mmol/L (ref 3.5–5.1)
Sodium: 136 mmol/L (ref 135–145)
Total Bilirubin: 0.4 mg/dL (ref 0.3–1.2)
Total Protein: 7 g/dL (ref 6.5–8.1)

## 2022-04-04 LAB — TOTAL PROTEIN, URINE DIPSTICK: Protein, ur: NEGATIVE mg/dL

## 2022-04-04 MED ORDER — SODIUM CHLORIDE 0.9% FLUSH
10.0000 mL | Freq: Once | INTRAVENOUS | Status: AC
  Administered 2022-04-04: 10 mL

## 2022-04-04 MED ORDER — HEPARIN SOD (PORK) LOCK FLUSH 100 UNIT/ML IV SOLN
500.0000 [IU] | Freq: Once | INTRAVENOUS | Status: AC
  Administered 2022-04-04: 500 [IU]

## 2022-04-04 NOTE — Progress Notes (Signed)
Irinotecan dose reduced to 160 mg/m2 per  Regan Rakers, NP. Orders updated in treatment plan.  Larene Beach, PharmD

## 2022-04-06 ENCOUNTER — Telehealth: Payer: Self-pay | Admitting: Hematology

## 2022-04-06 ENCOUNTER — Inpatient Hospital Stay: Payer: Medicare Other | Attending: Physician Assistant

## 2022-04-06 ENCOUNTER — Other Ambulatory Visit: Payer: Self-pay

## 2022-04-06 VITALS — BP 143/78 | HR 68 | Temp 98.5°F | Resp 16

## 2022-04-06 DIAGNOSIS — Z79899 Other long term (current) drug therapy: Secondary | ICD-10-CM | POA: Diagnosis not present

## 2022-04-06 DIAGNOSIS — C775 Secondary and unspecified malignant neoplasm of intrapelvic lymph nodes: Secondary | ICD-10-CM | POA: Insufficient documentation

## 2022-04-06 DIAGNOSIS — D649 Anemia, unspecified: Secondary | ICD-10-CM | POA: Insufficient documentation

## 2022-04-06 DIAGNOSIS — Z5111 Encounter for antineoplastic chemotherapy: Secondary | ICD-10-CM | POA: Diagnosis present

## 2022-04-06 DIAGNOSIS — Z5112 Encounter for antineoplastic immunotherapy: Secondary | ICD-10-CM | POA: Insufficient documentation

## 2022-04-06 DIAGNOSIS — C7800 Secondary malignant neoplasm of unspecified lung: Secondary | ICD-10-CM | POA: Insufficient documentation

## 2022-04-06 DIAGNOSIS — C20 Malignant neoplasm of rectum: Secondary | ICD-10-CM | POA: Insufficient documentation

## 2022-04-06 DIAGNOSIS — Z5189 Encounter for other specified aftercare: Secondary | ICD-10-CM | POA: Diagnosis not present

## 2022-04-06 MED ORDER — ATROPINE SULFATE 1 MG/ML IV SOLN
0.5000 mg | Freq: Once | INTRAVENOUS | Status: AC | PRN
  Administered 2022-04-06: 0.5 mg via INTRAVENOUS
  Filled 2022-04-06: qty 1

## 2022-04-06 MED ORDER — SODIUM CHLORIDE 0.9 % IV SOLN
400.0000 mg/m2 | Freq: Once | INTRAVENOUS | Status: AC
  Administered 2022-04-06: 724 mg via INTRAVENOUS
  Filled 2022-04-06: qty 36.2

## 2022-04-06 MED ORDER — SODIUM CHLORIDE 0.9 % IV SOLN
5.0000 mg/kg | Freq: Once | INTRAVENOUS | Status: AC
  Administered 2022-04-06: 300 mg via INTRAVENOUS
  Filled 2022-04-06: qty 12

## 2022-04-06 MED ORDER — PALONOSETRON HCL INJECTION 0.25 MG/5ML
0.2500 mg | Freq: Once | INTRAVENOUS | Status: AC
  Administered 2022-04-06: 0.25 mg via INTRAVENOUS
  Filled 2022-04-06: qty 5

## 2022-04-06 MED ORDER — SODIUM CHLORIDE 0.9 % IV SOLN: Freq: Once | INTRAVENOUS | Status: AC

## 2022-04-06 MED ORDER — SODIUM CHLORIDE 0.9 % IV SOLN
160.0000 mg/m2 | Freq: Once | INTRAVENOUS | Status: AC
  Administered 2022-04-06: 280 mg via INTRAVENOUS
  Filled 2022-04-06: qty 14

## 2022-04-06 MED ORDER — SODIUM CHLORIDE 0.9% FLUSH
10.0000 mL | INTRAVENOUS | Status: DC | PRN
  Administered 2022-04-06: 10 mL

## 2022-04-06 MED ORDER — SODIUM CHLORIDE 0.9 % IV SOLN
2400.0000 mg/m2 | INTRAVENOUS | Status: DC
  Administered 2022-04-06: 4350 mg via INTRAVENOUS
  Filled 2022-04-06: qty 87

## 2022-04-06 MED ORDER — SODIUM CHLORIDE 0.9 % IV SOLN
10.0000 mg | Freq: Once | INTRAVENOUS | Status: AC
  Administered 2022-04-06: 10 mg via INTRAVENOUS
  Filled 2022-04-06: qty 10

## 2022-04-06 NOTE — Patient Instructions (Addendum)
[facility] [place] ONCOLOGY  Discharge Instructions: Thank you for choosing [facility] to provide your oncology and hematology care.   If you have a lab appointment with the [facility], please go directly to the [facility] and check in at the registration area.   Wear comfortable clothing and clothing appropriate for easy access to any Portacath or PICC line.   We strive to give you quality time with your provider. You may need to reschedule your appointment if you arrive late (15 or more minutes).  Arriving late affects you and other patients whose appointments are after yours.  Also, if you miss three or more appointments without notifying the office, you may be dismissed from the clinic at the provider's discretion.      For prescription refill requests, have your pharmacy contact our office and allow 72 hours for refills to be completed.    Today you received the following chemotherapy and/or immunotherapy agents: Zirabev/Irinotecan/Leucovorin/Fluorouracil      To help prevent nausea and vomiting after your treatment, we encourage you to take your nausea medication as directed.  BELOW ARE SYMPTOMS THAT SHOULD BE REPORTED IMMEDIATELY: *FEVER GREATER THAN 100.4 F (38 C) OR HIGHER *CHILLS OR SWEATING *NAUSEA AND VOMITING THAT IS NOT CONTROLLED WITH YOUR NAUSEA MEDICATION *UNUSUAL SHORTNESS OF BREATH *UNUSUAL BRUISING OR BLEEDING *URINARY PROBLEMS (pain or burning when urinating, or frequent urination) *BOWEL PROBLEMS (unusual diarrhea, constipation, pain near the anus) TENDERNESS IN MOUTH AND THROAT WITH OR WITHOUT PRESENCE OF ULCERS (sore throat, sores in mouth, or a toothache) UNUSUAL RASH, SWELLING OR PAIN  UNUSUAL VAGINAL DISCHARGE OR ITCHING   Items with * indicate a potential emergency and should be followed up as soon as possible or go to the Emergency Department if any problems should occur.  Please show the CHEMOTHERAPY ALERT CARD or  IMMUNOTHERAPY ALERT CARD at check-in to the Emergency Department and triage nurse.  Should you have questions after your visit or need to cancel or reschedule your appointment, please contact [facility]  Dept: [phone]  and follow the prompts.  Office hours are 8:00 a.m. to 4:30 p.m. Monday - Friday. Please note that voicemails left after 4:00 p.m. may not be returned until the following business day.  We are closed weekends and major holidays. You have access to a nurse at all times for urgent questions. Please call the main number to the clinic Dept: [phone] and follow the prompts.   For any non-urgent questions, you may also contact your provider using MyChart. We now offer e-Visits for anyone 16 and older to request care online for non-urgent symptoms. For details visit [email or web address].   Also download the MyChart app! Go to the app store, search "MyChart", open the app, select [facility], and log in with your MyChart username and password.  Due to Covid, a mask is required upon entering the hospital/clinic. If you do not have a mask, one will be given to you upon arrival. For doctor visits, patients may have 1 support person aged 70 or older with them. For treatment visits, patients cannot have anyone with them due to current Covid guidelines and our immunocompromised population.   The chemotherapy medication bag should finish at 46 hours, 96 hours, or 7 days. For example, if your pump is scheduled for 46 hours and it was put on at 4:00 p.m., it should finish at 2:00 p.m. the day it is scheduled to come off regardless of your  appointment time.     Estimated time to finish at 08:45 on Saturday 04/08/2022   If the display on your pump reads "Low Volume" and it is beeping, take the batteries out of the pump and come to the cancer center for it to be taken off.   If the pump alarms go off prior to the pump reading "Low Volume" then call [phone]  and someone can assist you.  If the plunger comes out and the chemotherapy medication is leaking out, please use your home chemo spill kit to clean up the spill. Do NOT use paper towels or other household products.  If you have problems or questions regarding your pump, please call either [phone] (24 hours a day) or the cancer center Monday-Friday 8:00 a.m.- 4:30 p.m. at the clinic number and we will assist you. If you are unable to get assistance, then go to the nearest Emergency Department and ask the staff to contact the IV team for assistance.

## 2022-04-06 NOTE — Progress Notes (Signed)
OK per Teldrin, RPH to infuse Leucovorin over 90 minutes instead of 2 hours, concurrently with Irinotecan.

## 2022-04-06 NOTE — Telephone Encounter (Signed)
Scheduled follow-up appointments per 5/30 los. Patient's daughter is aware.

## 2022-04-08 ENCOUNTER — Inpatient Hospital Stay: Payer: Medicare Other

## 2022-04-08 VITALS — BP 146/82 | HR 65 | Temp 98.0°F | Resp 17

## 2022-04-08 DIAGNOSIS — C20 Malignant neoplasm of rectum: Secondary | ICD-10-CM

## 2022-04-08 DIAGNOSIS — Z5112 Encounter for antineoplastic immunotherapy: Secondary | ICD-10-CM | POA: Diagnosis not present

## 2022-04-08 MED ORDER — HEPARIN SOD (PORK) LOCK FLUSH 100 UNIT/ML IV SOLN
500.0000 [IU] | Freq: Once | INTRAVENOUS | Status: AC | PRN
  Administered 2022-04-08: 500 [IU]

## 2022-04-08 MED ORDER — PEGFILGRASTIM-JMDB 6 MG/0.6ML ~~LOC~~ SOSY
6.0000 mg | PREFILLED_SYRINGE | Freq: Once | SUBCUTANEOUS | Status: AC
  Administered 2022-04-08: 6 mg via SUBCUTANEOUS
  Filled 2022-04-08: qty 0.6

## 2022-04-08 MED ORDER — SODIUM CHLORIDE 0.9% FLUSH
10.0000 mL | INTRAVENOUS | Status: DC | PRN
  Administered 2022-04-08: 10 mL

## 2022-04-13 ENCOUNTER — Other Ambulatory Visit: Payer: Medicare Other

## 2022-04-13 ENCOUNTER — Ambulatory Visit: Payer: Medicare Other | Admitting: Nurse Practitioner

## 2022-04-13 ENCOUNTER — Ambulatory Visit: Payer: Medicare Other

## 2022-04-14 ENCOUNTER — Encounter: Payer: Self-pay | Admitting: Nurse Practitioner

## 2022-04-17 ENCOUNTER — Other Ambulatory Visit: Payer: Self-pay | Admitting: Nurse Practitioner

## 2022-04-17 MED ORDER — GABAPENTIN 100 MG PO CAPS
100.0000 mg | ORAL_CAPSULE | Freq: Every day | ORAL | 0 refills | Status: DC
Start: 2022-04-17 — End: 2022-05-28

## 2022-04-18 ENCOUNTER — Other Ambulatory Visit: Payer: Self-pay | Admitting: Hematology

## 2022-04-20 ENCOUNTER — Inpatient Hospital Stay: Payer: Medicare Other

## 2022-04-20 ENCOUNTER — Inpatient Hospital Stay (HOSPITAL_BASED_OUTPATIENT_CLINIC_OR_DEPARTMENT_OTHER): Payer: Medicare Other | Admitting: Oncology

## 2022-04-20 ENCOUNTER — Other Ambulatory Visit: Payer: Self-pay

## 2022-04-20 VITALS — BP 152/64 | HR 67 | Temp 97.7°F | Resp 16 | Ht 72.0 in | Wt 149.6 lb

## 2022-04-20 DIAGNOSIS — C20 Malignant neoplasm of rectum: Secondary | ICD-10-CM | POA: Diagnosis not present

## 2022-04-20 DIAGNOSIS — C7800 Secondary malignant neoplasm of unspecified lung: Secondary | ICD-10-CM | POA: Diagnosis not present

## 2022-04-20 DIAGNOSIS — Z95828 Presence of other vascular implants and grafts: Secondary | ICD-10-CM

## 2022-04-20 DIAGNOSIS — Z5112 Encounter for antineoplastic immunotherapy: Secondary | ICD-10-CM | POA: Diagnosis not present

## 2022-04-20 LAB — CBC WITH DIFFERENTIAL (CANCER CENTER ONLY)
Abs Immature Granulocytes: 0.03 10*3/uL (ref 0.00–0.07)
Basophils Absolute: 0 10*3/uL (ref 0.0–0.1)
Basophils Relative: 1 %
Eosinophils Absolute: 0.3 10*3/uL (ref 0.0–0.5)
Eosinophils Relative: 5 %
HCT: 43.2 % (ref 39.0–52.0)
Hemoglobin: 14.4 g/dL (ref 13.0–17.0)
Immature Granulocytes: 1 %
Lymphocytes Relative: 15 %
Lymphs Abs: 0.8 10*3/uL (ref 0.7–4.0)
MCH: 31.5 pg (ref 26.0–34.0)
MCHC: 33.3 g/dL (ref 30.0–36.0)
MCV: 94.5 fL (ref 80.0–100.0)
Monocytes Absolute: 0.8 10*3/uL (ref 0.1–1.0)
Monocytes Relative: 13 %
Neutro Abs: 3.8 10*3/uL (ref 1.7–7.7)
Neutrophils Relative %: 65 %
Platelet Count: 134 10*3/uL — ABNORMAL LOW (ref 150–400)
RBC: 4.57 MIL/uL (ref 4.22–5.81)
RDW: 15.2 % (ref 11.5–15.5)
WBC Count: 5.8 10*3/uL (ref 4.0–10.5)
nRBC: 0 % (ref 0.0–0.2)

## 2022-04-20 LAB — TOTAL PROTEIN, URINE DIPSTICK: Protein, ur: NEGATIVE mg/dL

## 2022-04-20 LAB — CMP (CANCER CENTER ONLY)
ALT: 15 U/L (ref 0–44)
AST: 19 U/L (ref 15–41)
Albumin: 3.6 g/dL (ref 3.5–5.0)
Alkaline Phosphatase: 157 U/L — ABNORMAL HIGH (ref 38–126)
Anion gap: 4 — ABNORMAL LOW (ref 5–15)
BUN: 10 mg/dL (ref 8–23)
CO2: 29 mmol/L (ref 22–32)
Calcium: 9.3 mg/dL (ref 8.9–10.3)
Chloride: 107 mmol/L (ref 98–111)
Creatinine: 0.89 mg/dL (ref 0.61–1.24)
GFR, Estimated: >60 mL/min (ref >60)
Glucose, Bld: 92 mg/dL (ref 70–99)
Potassium: 3.9 mmol/L (ref 3.5–5.1)
Sodium: 140 mmol/L (ref 135–145)
Total Bilirubin: 0.3 mg/dL (ref 0.3–1.2)
Total Protein: 6.9 g/dL (ref 6.5–8.1)

## 2022-04-20 LAB — CEA (IN HOUSE-CHCC): CEA (CHCC-In House): 19.7 ng/mL — ABNORMAL HIGH (ref 0.00–5.00)

## 2022-04-20 MED ORDER — PALONOSETRON HCL INJECTION 0.25 MG/5ML
0.2500 mg | Freq: Once | INTRAVENOUS | Status: AC
  Administered 2022-04-20: 0.25 mg via INTRAVENOUS
  Filled 2022-04-20: qty 5

## 2022-04-20 MED ORDER — SODIUM CHLORIDE 0.9 % IV SOLN
160.0000 mg/m2 | Freq: Once | INTRAVENOUS | Status: AC
  Administered 2022-04-20: 280 mg via INTRAVENOUS
  Filled 2022-04-20: qty 14

## 2022-04-20 MED ORDER — SODIUM CHLORIDE 0.9% FLUSH
10.0000 mL | Freq: Once | INTRAVENOUS | Status: AC
  Administered 2022-04-20: 10 mL

## 2022-04-20 MED ORDER — SODIUM CHLORIDE 0.9 % IV SOLN
400.0000 mg/m2 | Freq: Once | INTRAVENOUS | Status: AC
  Administered 2022-04-20: 724 mg via INTRAVENOUS
  Filled 2022-04-20: qty 25

## 2022-04-20 MED ORDER — SODIUM CHLORIDE 0.9 % IV SOLN
10.0000 mg | Freq: Once | INTRAVENOUS | Status: AC
  Administered 2022-04-20: 10 mg via INTRAVENOUS
  Filled 2022-04-20: qty 10

## 2022-04-20 MED ORDER — SODIUM CHLORIDE 0.9 % IV SOLN
5.0000 mg/kg | Freq: Once | INTRAVENOUS | Status: AC
  Administered 2022-04-20: 350 mg via INTRAVENOUS
  Filled 2022-04-20: qty 14

## 2022-04-20 MED ORDER — SODIUM CHLORIDE 0.9% FLUSH: 10.0000 mL | INTRAVENOUS | Status: DC | PRN

## 2022-04-20 MED ORDER — ATROPINE SULFATE 1 MG/ML IV SOLN
0.5000 mg | Freq: Once | INTRAVENOUS | Status: AC | PRN
  Administered 2022-04-20: 0.5 mg via INTRAVENOUS
  Filled 2022-04-20: qty 1

## 2022-04-20 MED ORDER — HEPARIN SOD (PORK) LOCK FLUSH 100 UNIT/ML IV SOLN: 500.0000 [IU] | Freq: Once | INTRAVENOUS | Status: DC | PRN

## 2022-04-20 MED ORDER — SODIUM CHLORIDE 0.9 % IV SOLN: Freq: Once | INTRAVENOUS | Status: AC

## 2022-04-20 MED ORDER — SODIUM CHLORIDE 0.9 % IV SOLN
2400.0000 mg/m2 | INTRAVENOUS | Status: DC
  Administered 2022-04-20: 4350 mg via INTRAVENOUS
  Filled 2022-04-20: qty 87

## 2022-04-20 NOTE — Patient Instructions (Signed)
[facility]  Discharge Instructions: Thank you for choosing [facility] to provide your oncology and hematology care.   If you have a lab appointment with the Cancer Center, please go directly to the Cancer Center and check in at the registration area.   Wear comfortable clothing and clothing appropriate for easy access to any Portacath or PICC line.   We strive to give you quality time with your provider. You may need to reschedule your appointment if you arrive late (15 or more minutes).  Arriving late affects you and other patients whose appointments are after yours.  Also, if you miss three or more appointments without notifying the office, you may be dismissed from the clinic at the provider's discretion.      For prescription refill requests, have your pharmacy contact our office and allow 72 hours for refills to be completed.    Today you received the following chemotherapy and/or immunotherapy agents: Zirabev/Irinotecan/Leucovorin/Fluorouracil      To help prevent nausea and vomiting after your treatment, we encourage you to take your nausea medication as directed.  BELOW ARE SYMPTOMS THAT SHOULD BE REPORTED IMMEDIATELY: *FEVER GREATER THAN 100.4 F (38 C) OR HIGHER *CHILLS OR SWEATING *NAUSEA AND VOMITING THAT IS NOT CONTROLLED WITH YOUR NAUSEA MEDICATION *UNUSUAL SHORTNESS OF BREATH *UNUSUAL BRUISING OR BLEEDING *URINARY PROBLEMS (pain or burning when urinating, or frequent urination) *BOWEL PROBLEMS (unusual diarrhea, constipation, pain near the anus) TENDERNESS IN MOUTH AND THROAT WITH OR WITHOUT PRESENCE OF ULCERS (sore throat, sores in mouth, or a toothache) UNUSUAL RASH, SWELLING OR PAIN  UNUSUAL VAGINAL DISCHARGE OR ITCHING   Items with * indicate a potential emergency and should be followed up as soon as possible or go to the Emergency Department if any problems should occur.  Please show the CHEMOTHERAPY ALERT CARD or  IMMUNOTHERAPY ALERT CARD at check-in to the Emergency Department and triage nurse.  Should you have questions after your visit or need to cancel or reschedule your appointment, please contact [facility]  Dept: [phone]  and follow the prompts.  Office hours are 8:00 a.m. to 4:30 p.m. Monday - Friday. Please note that voicemails left after 4:00 p.m. may not be returned until the following business day.  We are closed weekends and major holidays. You have access to a nurse at all times for urgent questions. Please call the main number to the clinic Dept: [phone] and follow the prompts.   For any non-urgent questions, you may also contact your provider using MyChart. We now offer e-Visits for anyone 18 and older to request care online for non-urgent symptoms. For details visit [email or web address].   Also download the MyChart app! Go to the app store, search "MyChart", open the app, select [facility], and log in with your MyChart username and password.  Due to Covid, a mask is required upon entering the hospital/clinic. If you do not have a mask, one will be given to you upon arrival. For doctor visits, patients may have 1 support person aged 18 or older with them. For treatment visits, patients cannot have anyone with them due to current Covid guidelines and our immunocompromised population.   The chemotherapy medication bag should finish at 46 hours, 96 hours, or 7 days. For example, if your pump is scheduled for 46 hours and it was put on at 4:00 p.m., it should finish at 2:00 p.m. the day it is scheduled to come off regardless of your   appointment time.     Estimated time to finish at 08:45 on Saturday 04/08/2022   If the display on your pump reads "Low Volume" and it is beeping, take the batteries out of the pump and come to the cancer center for it to be taken off.   If the pump alarms go off prior to the pump reading "Low Volume" then call [phone]  and someone can assist you.  If the plunger comes out and the chemotherapy medication is leaking out, please use your home chemo spill kit to clean up the spill. Do NOT use paper towels or other household products.  If you have problems or questions regarding your pump, please call either [phone] (24 hours a day) or the cancer center Monday-Friday 8:00 a.m.- 4:30 p.m. at the clinic number and we will assist you. If you are unable to get assistance, then go to the nearest Emergency Department and ask the staff to contact the IV team for assistance.

## 2022-04-20 NOTE — Progress Notes (Signed)
[place]   [phone] [phone]   Clinic Follow up Note   Patient Care Team: Pcp, No as PCP - General 04/20/2022  CHIEF COMPLAINT: Follow up metastatic rectal cancer   SUMMARY OF ONCOLOGIC HISTORY: Oncology History Overview Note  Cancer Staging Rectal cancer [facility]) Staging form: Colon and Rectum, AJCC 8th Edition - Clinical stage from 08/29/2021: [name], cM1 - Signed by [name], MD on 08/29/2021    Rectal cancer ([place])  07/23/2021 Imaging   CT AP  IMPRESSION: 1. Appearance of the rectum likely indicates a rectal mass suspicious for rectal carcinoma. Inflammatory process would be less likely. Direct visualization is suggested. 2. Cholelithiasis without evidence of acute cholecystitis. 3. Aortic atherosclerosis.   08/19/2021 Procedure   Colonoscopy, under Dr. [name]  Impression: - Rectal tenderness, palpable rectal mass and hemorrhoids found on digital rectal exam. - Stool in the entire examined colon - lavaged with still inadequate clearance. - One 35 mm polyp at the hepatic flexure. Biopsied. Tattooed distal in case future endoscopic resection is considered - however, has larger issues with rectal mass currently as below. - Four, 5 to 11 mm polyps in the transverse colon and at the hepatic flexure, removed with a cold snare. Resected and retrieved. - Diverticulosis in the recto-sigmoid colon and in the sigmoid colon. - Rule out malignancy, partially obstructing tumor in the rectum and from 6 to 13 cm proximal to the anus. Biopsied.   08/19/2021 Pathology Results   Diagnosis 1. Hepatic Flexure Biopsy - TUBULAR ADENOMA WITHOUT HIGH-GRADE DYSPLASIA OR MALIGNANCY 2. Transverse Colon Biopsy, and hepatic flexure, polyps (4) - TUBULAR ADENOMA WITHOUT HIGH-GRADE DYSPLASIA OR MALIGNANCY - OTHER FRAGMENTS OF POLYPOID COLONIC MUCOSA WITH NO SPECIFIC HISTOPATHOLOGIC CHANGES - FOOD MATERIAL 3. Rectum, biopsy - ADENOCARCINOMA. SEE  NOTE   08/22/2021 Initial Diagnosis   Rectal cancer ([place])   08/26/2021 Imaging   IMPRESSION: 1. A 2.2 x 1.9 cm right middle lobe pulmonary nodule as well as a total of four right upper lobe pulmonary nodule and masses measuring up to 3.6 cm. Findings concerning for metastatic primary lung cancer versus less likely metastases in a patient with rectal cancer. Additional imaging evaluation or consultation with Pulmonology or Thoracic Surgery recommended. 2. No gross hilar adenopathy, noting limited sensitivity for the detection of hilar adenopathy on this noncontrast study. 3. Cholelithiasis. 4.  Emphysema (ICD10-J43.9). 5. At least left anterior descending coronary artery calcifications.   08/27/2021 Imaging   IMPRESSION: Rectal adenocarcinoma T stage: T4 B   Rectal adenocarcinoma N stage: N2 disease likely associated with early extra mesorectal lymph node involvement.   Distance from tumor to the internal anal sphincter is 1.2 cm.   Also with extramural venous involvement as described.   08/29/2021 Cancer Staging   Staging form: Colon and Rectum, AJCC 8th Edition - Clinical stage from 08/29/2021: [name], cM1 - Signed by [name], MD on 08/29/2021 Stage prefix: Initial diagnosis   01/23/2022 Imaging   CT CAP w contrast IMPRESSION: 1. Mild interval decrease in size of multiple right-sided pulmonary nodules. No new suspicious pulmonary nodule or mass. 2. Interval development of approximately 10 new ill-defined hypoattenuating liver lesions ranging in size from approximately 5-10 mm. Imaging features suspicious for metastatic disease. MRI abdomen with and without contrast may prove helpful to further evaluate. 3. Similar appearance of wall thickening in the rectum with perirectal edema. 4. Cholelithiasis. 5. Prostatomegaly. 6. Aortic Atherosclerosis (ICD10-I70.0).   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 Villacis returns for follow-up and treatment as scheduled.  He was seen last by Roderic Ovens, NP on 04/04/2022 for cycle 4.  Reports improvement of lower extremity neuropathy since reduction of his chemo 2 weeks ago.  Denies any neuropathy in hands and headaches have resolved.  He continues to eat and drink well.  He has been compliant with his iron 3 times weekly.  Denies any abdominal pain.  Having normal bowel movements.  Review of Systems  Neurological:  Positive for tingling and sensory change (Improvement).  All other systems reviewed and are negative.   All other systems were reviewed with the patient and are negative.  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 ([place]) 08/19/2021    SURGICAL HISTORY: Past Surgical History:  Procedure Laterality Date   BRONCHIAL BIOPSY  09/27/2021   Procedure: BRONCHIAL BIOPSIES;  Surgeon: Garner Nash, DO;  Location: [facility];  Service: Pulmonary;;   BRONCHIAL BRUSHINGS  09/27/2021   Procedure: BRONCHIAL BRUSHINGS;  Surgeon: Garner Nash, DO;  Location: [facility];  Service: Pulmonary;;   BRONCHIAL NEEDLE ASPIRATION BIOPSY  09/27/2021   Procedure: BRONCHIAL NEEDLE ASPIRATION BIOPSIES;  Surgeon: Garner Nash, DO;  Location: [facility];  Service: Pulmonary;;   COLONOSCOPY     IR IMAGING GUIDED PORT INSERTION  10/24/2021   left breast surgery Left 1968   VIDEO BRONCHOSCOPY WITH RADIAL ENDOBRONCHIAL ULTRASOUND  09/27/2021   Procedure: RADIAL ENDOBRONCHIAL ULTRASOUND;  Surgeon: Garner Nash, DO;   Location: [facility];  Service: Pulmonary;;    I have reviewed the social history and family history with the patient and they are unchanged from previous note.  ALLERGIES:  has No Known Allergies.  MEDICATIONS:  Current Outpatient Medications  Medication Sig Dispense Refill   amLODipine (NORVASC) 5 MG tablet TAKE 1 TABLET (5 MG TOTAL) BY MOUTH DAILY. 30 tablet 0   carbonyl iron (FEOSOL) 45 MG TABS tablet Take 45 mg by mouth daily.     gabapentin (NEURONTIN) 100 MG capsule Take 1 capsule (100 mg total) by mouth at bedtime. 30 capsule 0   lidocaine-prilocaine (EMLA) cream Apply 1 application topically as needed. 30 g 2   Multiple Vitamin (MULTIVITAMIN WITH MINERALS) TABS tablet Take 1 tablet by mouth daily.     ondansetron (ZOFRAN) 8 MG tablet Take 1 tablet (8 mg total) by mouth every 8 (eight) hours as needed for nausea or vomiting. 30 tablet 0   oxyCODONE (OXY IR/ROXICODONE) 5 MG immediate release tablet Take 1 tablet (5 mg total) by mouth every 6 (six) hours as needed for severe pain. 90 tablet 0   No current facility-administered medications for this visit.    PHYSICAL EXAMINATION: ECOG PERFORMANCE STATUS: 1 - Symptomatic but completely ambulatory  There were no vitals filed for this visit.  There were no vitals filed for this visit.  Physical Exam Constitutional:      Appearance: Normal appearance.  Cardiovascular:     Rate and Rhythm: Normal rate. Rhythm irregular.  Pulmonary:  Effort: Pulmonary effort is normal.     Breath sounds: Normal breath sounds.  Abdominal:     General: Bowel sounds are normal.     Palpations: Abdomen is soft.  Musculoskeletal:        General: No swelling. Normal range of motion.  Neurological:     Mental Status: He is alert and oriented to person, place, and time. Mental status is at baseline.      LABORATORY DATA:  I have reviewed the data as listed    Latest Ref Rng & Units 04/20/2022    8:05 AM 04/04/2022   12:23 PM 03/22/2022    10:10 AM  CBC  WBC 4.0 - 10.5 K/uL 5.8  3.8  5.1   Hemoglobin 13.0 - 17.0 g/dL 14.4  14.0  13.9   Hematocrit 39.0 - 52.0 % 43.2  41.3  40.9   Platelets 150 - 400 K/uL 134  232  172         Latest Ref Rng & Units 04/04/2022   12:23 PM 03/22/2022   10:10 AM 03/07/2022    9:12 AM  CMP  Glucose 70 - 99 mg/dL 97  93  91   BUN 8 - 23 mg/dL '12  11  14   ' Creatinine 0.61 - 1.24 mg/dL 0.82  0.94  0.93   Sodium 135 - 145 mmol/L 136  136  139   Potassium 3.5 - 5.1 mmol/L 4.2  4.6  4.2   Chloride 98 - 111 mmol/L 104  104  104   CO2 22 - 32 mmol/L '29  29  31   ' Calcium 8.9 - 10.3 mg/dL 9.3  8.9  9.2   Total Protein 6.5 - 8.1 g/dL 7.0  7.0  6.8   Total Bilirubin 0.3 - 1.2 mg/dL 0.4  0.5  0.4   Alkaline Phos 38 - 126 U/L 115  119  114   AST 15 - 41 U/L '24  20  25   ' ALT 0 - 44 U/L '17  15  18       ' RADIOGRAPHIC STUDIES: I have personally reviewed the radiological images as listed and agreed with the findings in the report. No results found.   ASSESSMENT & PLAN: AXIEL FJELD is a 71 y.o. male with    1. Rectal Adenocarcinoma, [phone] with lung metastasis, MMR normal  -Presented with rectal bleeding and frequent bowel movement. CT AP 07/23/21 showed rectal mass with no significant adenopathy or distant metastasis. Colonoscopy 08/19/21 showed a 7 cm infiltrative mass in the mid rectum (6-13cm from anal verge), pathology showed adenocarcinoma. MMR is normal -staging pelvic MRI 08/27/21 showed: staging at T4b; N2 likely associated with early extra mesorectal lymph node involvement; distance from tumor to internal anal sphincter is 1.2 cm; also with extramural venous involvement. -baseline CEA 571.84 on 09/06/21. -he received concurrent chemoRT with Xeloda 11/1-11/21/22 under the care of Dr. [name].  He responded clinically, no recurrent rectal bleeding, his pain has improved  -CEA has decreased on treatment -he underwent bronchscopy and biopsy of the RUL and RML masses on 09/27/21, which were  consistent with metastatic rectal cancer.  -He began first-line CAPOX and bevacizumab on 10/14/21, taking Xeloda 1500 mg BID 2 weeks on/1 week off. He tolerated very well overall. C2 held for transaminitis.  Cycle 4 oxaliplatin dose reduced for cytopenias.  Otherwise tolerated well -Restaging CT CAP 01/23/2022 showed decrease in size of pulmonary nodules but showed approximately 10 new small liver lesions -Further evaluation with  MR abdomen/7/23 showed multiple indeterminate liver lesions, new compared to prior CT therefore concerning for metastatic disease.  This had not been biopsy-proven -He changed to second line FOLFIRI and bevacizumab with dose reduced irinotecan with cycle 1 on 02/21/2022, tolerated well and increased to full dose on C2.  Irinotecan dose reduced on 04/04/2022 due to neuropathy. CEA has decreased -Mr. Treiber appears stable today.  Tolerating treatment well.  Neuropathy and headaches have resolved since dose reduction of irinotecan cycle 4. -Reviewed labs and adequate to proceed with cycle 5 dose reduced FOLFIRI and bevacizumab.  Return to clinic in 2 days for pump removal and GCSF.  -Given he is tolerating treatment w/o new concerns today will plan for restaging scans after cycle 6.  We will place orders at next visit. -Return to clinic in 2 weeks prior to cycle 6..   2. Symptom Management: Rectal pain, weight loss, fatigue, elevated blood pressure -Secondary to rectal cancer and treatment -Improved after chemo RT -was taking MS contin sporadically so we stopped it -takes oxycodone PRN, not daily per pt -He is gaining weight, continue follow-up with dietitian -Continue amlodipine.    3. IDA -secondary to rectal bleeding.  -on iron supplments- MWF -Improved.  -hemoglobin 14.4, MVC 94.5  4. Pulmonary nodules/metastatic rectal adenocarcinoma -Seen on chest CT 08/26/21 showing 2.2 cm RML nodule and four RUL nodules/masses measuring up to 3.6 cm. Findings concerning for  metastatic primary lung cancer. -He met with Dr. Valeta Harms who performed bronchoscopy. The RML and RUL lesions were positive for metastatic rectal adenocarcinoma.   -He denies any chest pain, shortness of breath, cough, or hemoptysis  5. Neuropathy -Irinotecan dose reduced to 160 mg/m2 -stable  6. Thrombocytopenia -Plt 134,000 -secondary to tx. -per tx parameters hold for plt < 100,000. -monitor for now.  PLAN: -labs reviewed -Proceed with cycle 5 dose reduced FOLFIRI and Beva with a GCSF with pump DC on day 3. -Follow-up in 2 weeks with lab work prior to cycle 6. -Restaging scans after cycle 6. -Continue iron supplementation 1 tablet Monday Wednesday Friday.  I spent 25 minutes dedicated to the care of this patient (face-to-face and non-face-to-face) on the date of the encounter to include what is described in the assessment and plan.   All questions were answered. The patient knows to call the clinic with any problems, questions or concerns. No barriers to learning were detected.      Jacquelin Hawking, NP 04/20/22

## 2022-04-20 NOTE — Progress Notes (Signed)
OK to adj Bevacizumab dose today using most recent weight per Faythe Casa, NP.  Kennith Center, Pharm.D., CPP 04/20/2022'@10'$ :18 AM

## 2022-04-22 ENCOUNTER — Inpatient Hospital Stay: Payer: Medicare Other

## 2022-04-22 ENCOUNTER — Other Ambulatory Visit: Payer: Self-pay | Admitting: Nurse Practitioner

## 2022-04-22 ENCOUNTER — Other Ambulatory Visit: Payer: Self-pay

## 2022-04-22 VITALS — BP 151/81 | HR 67 | Temp 98.2°F

## 2022-04-22 DIAGNOSIS — Z5112 Encounter for antineoplastic immunotherapy: Secondary | ICD-10-CM | POA: Diagnosis not present

## 2022-04-22 DIAGNOSIS — C7800 Secondary malignant neoplasm of unspecified lung: Secondary | ICD-10-CM

## 2022-04-22 DIAGNOSIS — Z95828 Presence of other vascular implants and grafts: Secondary | ICD-10-CM

## 2022-04-22 MED ORDER — PEGFILGRASTIM-JMDB 6 MG/0.6ML ~~LOC~~ SOSY
6.0000 mg | PREFILLED_SYRINGE | Freq: Once | SUBCUTANEOUS | Status: AC
  Administered 2022-04-22: 6 mg via SUBCUTANEOUS

## 2022-04-22 MED ORDER — HEPARIN SOD (PORK) LOCK FLUSH 100 UNIT/ML IV SOLN
500.0000 [IU] | Freq: Once | INTRAVENOUS | Status: AC
  Administered 2022-04-22: 500 [IU]

## 2022-04-22 MED ORDER — SODIUM CHLORIDE 0.9% FLUSH
10.0000 mL | Freq: Once | INTRAVENOUS | Status: AC
  Administered 2022-04-22: 10 mL

## 2022-04-22 NOTE — Patient Instructions (Signed)

## 2022-04-26 ENCOUNTER — Other Ambulatory Visit: Payer: Self-pay | Admitting: Nurse Practitioner

## 2022-04-26 MED ORDER — OXYCODONE HCL 5 MG PO TABS: 5.0000 mg | ORAL_TABLET | Freq: Four times a day (QID) | ORAL | 0 refills | Status: DC | PRN

## 2022-04-27 ENCOUNTER — Encounter: Payer: Self-pay | Admitting: Hematology

## 2022-04-27 ENCOUNTER — Other Ambulatory Visit: Payer: Self-pay

## 2022-04-27 ENCOUNTER — Other Ambulatory Visit: Payer: Self-pay | Admitting: Hematology

## 2022-05-02 ENCOUNTER — Other Ambulatory Visit: Payer: Self-pay

## 2022-05-02 DIAGNOSIS — C20 Malignant neoplasm of rectum: Secondary | ICD-10-CM

## 2022-05-02 MED FILL — Dexamethasone Sodium Phosphate Inj 100 MG/10ML: INTRAMUSCULAR | Qty: 1 | Status: AC

## 2022-05-02 NOTE — Progress Notes (Signed)
[place]   Telephone:([phone]) [phone] [phone]   Clinic Follow up Note   Patient Care Team: Pcp, No as PCP - General 05/03/2022  CHIEF COMPLAINT: Follow-up metastatic rectal cancer  SUMMARY OF ONCOLOGIC HISTORY: Oncology History Overview Note  Cancer Staging Rectal cancer [facility]) Staging form: Colon and Rectum, AJCC 8th Edition - Clinical stage from 08/29/2021: [name], cM1 - Signed by [name], MD on 08/29/2021    Rectal cancer ([place])  07/23/2021 Imaging   CT AP  IMPRESSION: 1. Appearance of the rectum likely indicates a rectal mass suspicious for rectal carcinoma. Inflammatory process would be less likely. Direct visualization is suggested. 2. Cholelithiasis without evidence of acute cholecystitis. 3. Aortic atherosclerosis.   08/19/2021 Procedure   Colonoscopy, under Dr. [name]  Impression: - Rectal tenderness, palpable rectal mass and hemorrhoids found on digital rectal exam. - Stool in the entire examined colon - lavaged with still inadequate clearance. - One 35 mm polyp at the hepatic flexure. Biopsied. Tattooed distal in case future endoscopic resection is considered - however, has larger issues with rectal mass currently as below. - Four, 5 to 11 mm polyps in the transverse colon and at the hepatic flexure, removed with a cold snare. Resected and retrieved. - Diverticulosis in the recto-sigmoid colon and in the sigmoid colon. - Rule out malignancy, partially obstructing tumor in the rectum and from 6 to 13 cm proximal to the anus. Biopsied.   08/19/2021 Pathology Results   Diagnosis 1. Hepatic Flexure Biopsy - TUBULAR ADENOMA WITHOUT HIGH-GRADE DYSPLASIA OR MALIGNANCY 2. Transverse Colon Biopsy, and hepatic flexure, polyps (4) - TUBULAR ADENOMA WITHOUT HIGH-GRADE DYSPLASIA OR MALIGNANCY - OTHER FRAGMENTS OF POLYPOID COLONIC MUCOSA WITH NO SPECIFIC HISTOPATHOLOGIC CHANGES - FOOD MATERIAL 3. Rectum, biopsy - ADENOCARCINOMA. SEE NOTE    08/22/2021 Initial Diagnosis   Rectal cancer ([place])   08/26/2021 Imaging   IMPRESSION: 1. A 2.2 x 1.9 cm right middle lobe pulmonary nodule as well as a total of four right upper lobe pulmonary nodule and masses measuring up to 3.6 cm. Findings concerning for metastatic primary lung cancer versus less likely metastases in a patient with rectal cancer. Additional imaging evaluation or consultation with Pulmonology or Thoracic Surgery recommended. 2. No gross hilar adenopathy, noting limited sensitivity for the detection of hilar adenopathy on this noncontrast study. 3. Cholelithiasis. 4.  Emphysema (ICD10-J43.9). 5. At least left anterior descending coronary artery calcifications.   08/27/2021 Imaging   IMPRESSION: Rectal adenocarcinoma T stage: T4 B   Rectal adenocarcinoma N stage: N2 disease likely associated with early extra mesorectal lymph node involvement.   Distance from tumor to the internal anal sphincter is 1.2 cm.   Also with extramural venous involvement as described.   08/29/2021 Cancer Staging   Staging form: Colon and Rectum, AJCC 8th Edition - Clinical stage from 08/29/2021: [name], cM1 - Signed by [name], MD on 08/29/2021 Stage prefix: Initial diagnosis   01/23/2022 Imaging   CT CAP w contrast IMPRESSION: 1. Mild interval decrease in size of multiple right-sided pulmonary nodules. No new suspicious pulmonary nodule or mass. 2. Interval development of approximately 10 new ill-defined hypoattenuating liver lesions ranging in size from approximately 5-10 mm. Imaging features suspicious for metastatic disease. MRI abdomen with and without contrast may prove helpful to further evaluate. 3. Similar appearance of wall thickening in the rectum with perirectal edema. 4. Cholelithiasis. 5. Prostatomegaly. 6. Aortic Atherosclerosis (ICD10-I70.0).   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  He feels well today, main complaint is numbness and tingling in his toes.  He takes gabapentin 100 mg at night which helps some.  There is no pain.  He notices this more when he is active, walking, or pushing off on his feet.  He takes oxycodone intermittently, thinks this gives him more energy to take care of his grandchildren at home.  Denies new or worsening pain.  Appetite is good, bowels moving well.  Denies fever, chills, cough, chest pain, dyspnea, leg edema, or any other new specific complaints.  All other systems were reviewed with the patient and are negative.  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 ([place]) 08/19/2021    SURGICAL HISTORY: Past Surgical History:  Procedure Laterality Date   BRONCHIAL BIOPSY  09/27/2021   Procedure: BRONCHIAL BIOPSIES;  Surgeon: Garner Nash, DO;  Location: [facility];  Service: Pulmonary;;   BRONCHIAL BRUSHINGS  09/27/2021   Procedure: BRONCHIAL BRUSHINGS;  Surgeon: Garner Nash, DO;  Location: [facility];  Service: Pulmonary;;   BRONCHIAL NEEDLE ASPIRATION BIOPSY  09/27/2021   Procedure: BRONCHIAL NEEDLE ASPIRATION BIOPSIES;  Surgeon: Garner Nash, DO;  Location: [place];  Service: Pulmonary;;   COLONOSCOPY     IR IMAGING GUIDED PORT INSERTION  10/24/2021   left breast surgery Left 1968   VIDEO BRONCHOSCOPY WITH RADIAL ENDOBRONCHIAL ULTRASOUND  09/27/2021    Procedure: RADIAL ENDOBRONCHIAL ULTRASOUND;  Surgeon: Garner Nash, DO;  Location: [facility];  Service: Pulmonary;;    I have reviewed the social history and family history with the patient and they are unchanged from previous note.  ALLERGIES:  has No Known Allergies.  MEDICATIONS:  Current Outpatient Medications  Medication Sig Dispense Refill   amLODipine (NORVASC) 5 MG tablet TAKE 1 TABLET (5 MG TOTAL) BY MOUTH DAILY. 30 tablet 0   carbonyl iron (FEOSOL) 45 MG TABS tablet Take 45 mg by mouth daily.     gabapentin (NEURONTIN) 100 MG capsule Take 1 capsule (100 mg total) by mouth at bedtime. 30 capsule 0   Multiple Vitamin (MULTIVITAMIN WITH MINERALS) TABS tablet Take 1 tablet by mouth daily.     ondansetron (ZOFRAN) 8 MG tablet Take 1 tablet (8 mg total) by mouth every 8 (eight) hours as needed for nausea or vomiting. 30 tablet 0   oxyCODONE (OXY IR/ROXICODONE) 5 MG immediate release tablet Take 1 tablet (5 mg total) by mouth every 6 (six) hours as needed for severe pain. 90 tablet 0   No current facility-administered medications for this visit.   Facility-Administered Medications Ordered in Other Visits  Medication Dose Route Frequency Provider Last Rate Last Admin   fluorouracil (ADRUCIL) 4,450 mg in sodium chloride 0.9 % 61 mL chemo infusion  2,400 mg/m2 (Treatment Plan Recorded) Intravenous 1 day or 1 dose [name], MD       heparin lock flush 100 unit/mL  500 Units Intracatheter Once PRN [name], MD       irinotecan (CAMPTOSAR) 300 mg in sodium chloride 0.9 % 500 mL chemo infusion  160 mg/m2 (Treatment Plan Recorded) Intravenous Once [name], MD 343 mL/hr at 05/03/22 1200 300 mg at 05/03/22 1200   leucovorin 744 mg in sodium chloride 0.9 % 250 mL infusion  400 mg/m2 (Treatment Plan Recorded) Intravenous Once [name], MD 144 mL/hr at 05/03/22 1201 744 mg at 05/03/22 1201   sodium chloride flush (NS) 0.9 % injection 10 mL  10 mL Intracatheter PRN [name], MD         PHYSICAL EXAMINATION: ECOG PERFORMANCE STATUS: 1 - Symptomatic but completely ambulatory  Vitals:   05/03/22 0919  BP: (!) 146/59  Pulse: 64  Resp: 18  Temp: 98.1 F (36.7 C)  SpO2: 100%   Filed Weights   05/03/22 0919  Weight: 147 lb 4.8 oz (66.8 kg)    GENERAL:alert, no distress and comfortable SKIN: No rash EYES: sclera clear LUNGS:  normal breathing effort HEART: no lower extremity edema NEURO: alert & oriented x 3 with fluent speech, no focal motor deficits PAC without erythema  LABORATORY DATA:  I have reviewed the data as listed    Latest Ref Rng & Units 05/03/2022    8:48 AM 04/20/2022    8:05 AM 04/04/2022   12:23 PM  CBC  WBC 4.0 - 10.5 K/uL 6.8  5.8  3.8   Hemoglobin 13.0 - 17.0 g/dL 14.1  14.4  14.0   Hematocrit 39.0 - 52.0 % 42.5  43.2  41.3   Platelets 150 - 400 K/uL 165  134  232         Latest Ref Rng & Units 05/03/2022    8:48 AM 04/20/2022    8:05 AM 04/04/2022   12:23 PM  CMP  Glucose 70 - 99 mg/dL 196  92  97   BUN 8 - 23 mg/dL '12  10  12   ' Creatinine 0.61 - 1.24 mg/dL 0.95  0.89  0.82   Sodium 135 - 145 mmol/L 136  140  136   Potassium 3.5 - 5.1 mmol/L 4.1  3.9  4.2   Chloride 98 - 111 mmol/L 104  107  104   CO2 22 - 32 mmol/L '29  29  29   ' Calcium 8.9 - 10.3 mg/dL 9.0  9.3  9.3   Total Protein 6.5 - 8.1 g/dL 6.5  6.9  7.0   Total Bilirubin 0.3 - 1.2 mg/dL 0.3  0.3  0.4   Alkaline Phos 38 - 126 U/L 147  157  115   AST 15 - 41 U/L '19  19  24   ' ALT 0 - 44 U/L '14  15  17       ' RADIOGRAPHIC STUDIES: I have personally reviewed the radiological images as listed and agreed with the findings in the report. No results found.   ASSESSMENT & PLAN: Jorge [name] is a 71 y.o. male with    1. Rectal Adenocarcinoma, [phone] with lung metastasis, MMR normal  -Presented with rectal bleeding and frequent bowel movement. CT AP 07/23/21 showed rectal mass with no significant adenopathy or distant metastasis. Colonoscopy 08/19/21 showed a 7 cm  infiltrative mass in the mid rectum (6-13cm from anal verge), pathology showed adenocarcinoma. MMR is normal -staging pelvic MRI 08/27/21 showed: staging at T4b; N2 likely associated with early extra mesorectal lymph node involvement; distance from tumor to internal anal sphincter  is 1.2 cm; also with extramural venous involvement. -baseline CEA 571.84 on 09/06/21. -he received concurrent chemoRT with Xeloda 11/1-11/21/22 under the care of Dr. [name].  He responded clinically, no recurrent rectal bleeding, his pain has improved  -CEA has decreased on treatment -he underwent bronchscopy and biopsy of the RUL and RML masses on 09/27/21, which were consistent with metastatic rectal cancer.  -He began first-line CAPOX and bevacizumab on 10/14/21, taking Xeloda 1500 mg BID 2 weeks on/1 week off. He tolerated very well overall. C2 held for transaminitis.  Cycle 4 oxaliplatin dose reduced for cytopenias.  Otherwise tolerated well -Restaging CT CAP 01/23/2022 showed decrease in size of pulmonary nodules but showed approximately 10 new small liver lesions -Further evaluation with MR abdomen/7/23 showed multiple indeterminate liver lesions, new compared to prior CT therefore concerning for metastatic disease.  This had not been biopsy-proven -He changed to second line FOLFIRI and bevacizumab with dose reduced irinotecan with cycle 1 on 02/21/2022, tolerated well and increased to full dose on C2 But then decreased again with cycle 4 due to neuropathy -CEA has decreased on treatment, consistent with a clinical response -Mr. Honse appears stable.  S/p 5 cycles FOLFIRI and continues bevacizumab.  He tolerates treatment very well overall, mainly with neuropathy.  We reviewed symptom management, I recommend to increase gabapentin to 100 mg 3 times daily.  He is able to recover and function well with good performance status.  There is no clinical evidence of disease progression -Labs reviewed, adequate to proceed with cycle  6 FOLFIRI/Beva today as planned, continue same dose reduction. -Is being referred for restaging scans after this cycle -He has a lot of responsibility at home taking care of his grandchildren.  He is interested in maintenance treatment in the future if he is a candidate. -Follow-up in 2 weeks to review CT and next cycle   2. Symptom Management: Rectal pain, weight loss, fatigue, elevated blood pressure -Secondary to rectal cancer and treatment -Improved after chemo RT -was taking MS contin sporadically so we stopped it -takes oxycodone PRN, not daily per pt -He is gaining weight, continue follow-up with dietitian -He has had persistently elevated BP in clinic lately.  He notes stress at home.  I started amlodipine, BP slightly improved   3. Mild anemia from rectal bleeding, probable iron deficiency -He has started oral iron, tolerating well.  We will continue. -hgb overall stable, mild anemia at 11-12.8 range -Anemia resolved as of 02/15/2022 -He developed dark stools on iron, he can reduce to MWF -Continue monitoring   4. Pulmonary nodules/metastatic rectal adenocarcinoma -Seen on chest CT 08/26/21 showing 2.2 cm RML nodule and four RUL nodules/masses measuring up to 3.6 cm. Findings concerning for metastatic primary lung cancer. -He met with Dr. Valeta Harms who performed bronchoscopy. The RML and RUL lesions were positive for metastatic rectal adenocarcinoma.   -He denies any chest pain, shortness of breath, cough, or hemoptysis  Plan: -Labs and CEA trend reviewed -Proceed with cycle 6 FOLFIRI/Beva today as planned -G-CSF on day 3 -increase gabapentin to 100 mg TID -Restage after this cycle -Follow-up in 2 weeks with next cycle   Orders Placed This Encounter  Procedures   CT CHEST ABDOMEN PELVIS W CONTRAST    Standing Status:   Future    Standing Expiration Date:   05/04/2023    Order Specific Question:   Preferred imaging location?    Answer:   [facility]    Order  Specific Question:   Is  Oral Contrast requested for this exam?    Answer:   Yes, Per Radiology protocol   All questions were answered. The patient knows to call the clinic with any problems, questions or concerns. No barriers to learning were detected.     Alla Feeling, NP 05/03/22

## 2022-05-03 ENCOUNTER — Encounter: Payer: Self-pay | Admitting: Nurse Practitioner

## 2022-05-03 ENCOUNTER — Inpatient Hospital Stay: Payer: Medicare Other

## 2022-05-03 ENCOUNTER — Other Ambulatory Visit: Payer: Self-pay

## 2022-05-03 ENCOUNTER — Other Ambulatory Visit: Payer: Self-pay | Admitting: Hematology

## 2022-05-03 ENCOUNTER — Inpatient Hospital Stay (HOSPITAL_BASED_OUTPATIENT_CLINIC_OR_DEPARTMENT_OTHER): Payer: Medicare Other | Admitting: Nurse Practitioner

## 2022-05-03 VITALS — BP 146/59 | HR 64 | Temp 98.1°F | Resp 18 | Ht 72.0 in | Wt 147.3 lb

## 2022-05-03 DIAGNOSIS — C20 Malignant neoplasm of rectum: Secondary | ICD-10-CM

## 2022-05-03 DIAGNOSIS — C7800 Secondary malignant neoplasm of unspecified lung: Secondary | ICD-10-CM

## 2022-05-03 DIAGNOSIS — Z5112 Encounter for antineoplastic immunotherapy: Secondary | ICD-10-CM | POA: Diagnosis not present

## 2022-05-03 DIAGNOSIS — Z95828 Presence of other vascular implants and grafts: Secondary | ICD-10-CM

## 2022-05-03 LAB — CBC WITH DIFFERENTIAL/PLATELET
Abs Immature Granulocytes: 0.04 10*3/uL (ref 0.00–0.07)
Basophils Absolute: 0 10*3/uL (ref 0.0–0.1)
Basophils Relative: 0 %
Eosinophils Absolute: 0.4 10*3/uL (ref 0.0–0.5)
Eosinophils Relative: 5 %
HCT: 42.5 % (ref 39.0–52.0)
Hemoglobin: 14.1 g/dL (ref 13.0–17.0)
Immature Granulocytes: 1 %
Lymphocytes Relative: 12 %
Lymphs Abs: 0.8 10*3/uL (ref 0.7–4.0)
MCH: 31.1 pg (ref 26.0–34.0)
MCHC: 33.2 g/dL (ref 30.0–36.0)
MCV: 93.6 fL (ref 80.0–100.0)
Monocytes Absolute: 0.8 10*3/uL (ref 0.1–1.0)
Monocytes Relative: 12 %
Neutro Abs: 4.8 10*3/uL (ref 1.7–7.7)
Neutrophils Relative %: 70 %
Platelets: 165 10*3/uL (ref 150–400)
RBC: 4.54 MIL/uL (ref 4.22–5.81)
RDW: 15.3 % (ref 11.5–15.5)
WBC: 6.8 10*3/uL (ref 4.0–10.5)
nRBC: 0 % (ref 0.0–0.2)

## 2022-05-03 LAB — COMPREHENSIVE METABOLIC PANEL
ALT: 14 U/L (ref 0–44)
AST: 19 U/L (ref 15–41)
Albumin: 3.5 g/dL (ref 3.5–5.0)
Alkaline Phosphatase: 147 U/L — ABNORMAL HIGH (ref 38–126)
Anion gap: 3 — ABNORMAL LOW (ref 5–15)
BUN: 12 mg/dL (ref 8–23)
CO2: 29 mmol/L (ref 22–32)
Calcium: 9 mg/dL (ref 8.9–10.3)
Chloride: 104 mmol/L (ref 98–111)
Creatinine, Ser: 0.95 mg/dL (ref 0.61–1.24)
GFR, Estimated: >60 mL/min (ref >60)
Glucose, Bld: 196 mg/dL — ABNORMAL HIGH (ref 70–99)
Potassium: 4.1 mmol/L (ref 3.5–5.1)
Sodium: 136 mmol/L (ref 135–145)
Total Bilirubin: 0.3 mg/dL (ref 0.3–1.2)
Total Protein: 6.5 g/dL (ref 6.5–8.1)

## 2022-05-03 LAB — TOTAL PROTEIN, URINE DIPSTICK: Protein, ur: NEGATIVE mg/dL

## 2022-05-03 MED ORDER — SODIUM CHLORIDE 0.9% FLUSH: 10.0000 mL | INTRAVENOUS | Status: DC | PRN

## 2022-05-03 MED ORDER — SODIUM CHLORIDE 0.9 % IV SOLN
2400.0000 mg/m2 | INTRAVENOUS | Status: DC
  Administered 2022-05-03: 4450 mg via INTRAVENOUS
  Filled 2022-05-03: qty 89

## 2022-05-03 MED ORDER — HEPARIN SOD (PORK) LOCK FLUSH 100 UNIT/ML IV SOLN: 500.0000 [IU] | Freq: Once | INTRAVENOUS | Status: DC | PRN

## 2022-05-03 MED ORDER — SODIUM CHLORIDE 0.9 % IV SOLN
5.0000 mg/kg | Freq: Once | INTRAVENOUS | Status: AC
  Administered 2022-05-03: 350 mg via INTRAVENOUS
  Filled 2022-05-03: qty 14

## 2022-05-03 MED ORDER — SODIUM CHLORIDE 0.9 % IV SOLN: Freq: Once | INTRAVENOUS | Status: AC

## 2022-05-03 MED ORDER — PALONOSETRON HCL INJECTION 0.25 MG/5ML
0.2500 mg | Freq: Once | INTRAVENOUS | Status: AC
  Administered 2022-05-03: 0.25 mg via INTRAVENOUS
  Filled 2022-05-03: qty 5

## 2022-05-03 MED ORDER — SODIUM CHLORIDE 0.9 % IV SOLN
10.0000 mg | Freq: Once | INTRAVENOUS | Status: AC
  Administered 2022-05-03: 10 mg via INTRAVENOUS
  Filled 2022-05-03: qty 10

## 2022-05-03 MED ORDER — ATROPINE SULFATE 1 MG/ML IV SOLN
0.5000 mg | Freq: Once | INTRAVENOUS | Status: AC | PRN
  Administered 2022-05-03: 0.5 mg via INTRAVENOUS
  Filled 2022-05-03: qty 1

## 2022-05-03 MED ORDER — SODIUM CHLORIDE 0.9% FLUSH
10.0000 mL | Freq: Once | INTRAVENOUS | Status: AC
  Administered 2022-05-03: 10 mL

## 2022-05-03 MED ORDER — SODIUM CHLORIDE 0.9 % IV SOLN
400.0000 mg/m2 | Freq: Once | INTRAVENOUS | Status: AC
  Administered 2022-05-03: 744 mg via INTRAVENOUS
  Filled 2022-05-03: qty 25

## 2022-05-03 MED ORDER — SODIUM CHLORIDE 0.9 % IV SOLN
160.0000 mg/m2 | Freq: Once | INTRAVENOUS | Status: AC
  Administered 2022-05-03: 300 mg via INTRAVENOUS
  Filled 2022-05-03: qty 15

## 2022-05-05 ENCOUNTER — Telehealth: Payer: Self-pay | Admitting: Hematology

## 2022-05-05 ENCOUNTER — Other Ambulatory Visit: Payer: Self-pay

## 2022-05-05 ENCOUNTER — Inpatient Hospital Stay: Payer: Medicare Other

## 2022-05-05 VITALS — BP 136/71 | HR 57 | Temp 98.7°F | Resp 18

## 2022-05-05 DIAGNOSIS — Z5112 Encounter for antineoplastic immunotherapy: Secondary | ICD-10-CM | POA: Diagnosis not present

## 2022-05-05 DIAGNOSIS — Z95828 Presence of other vascular implants and grafts: Secondary | ICD-10-CM

## 2022-05-05 DIAGNOSIS — C20 Malignant neoplasm of rectum: Secondary | ICD-10-CM

## 2022-05-05 MED ORDER — SODIUM CHLORIDE 0.9% FLUSH: 3.0000 mL | INTRAVENOUS | Status: DC | PRN

## 2022-05-05 MED ORDER — SODIUM CHLORIDE 0.9% FLUSH
10.0000 mL | Freq: Once | INTRAVENOUS | Status: AC
  Administered 2022-05-05: 10 mL

## 2022-05-05 MED ORDER — HEPARIN SOD (PORK) LOCK FLUSH 100 UNIT/ML IV SOLN
500.0000 [IU] | Freq: Once | INTRAVENOUS | Status: AC | PRN
  Administered 2022-05-05: 500 [IU]

## 2022-05-05 MED ORDER — PEGFILGRASTIM-JMDB 6 MG/0.6ML ~~LOC~~ SOSY
6.0000 mg | PREFILLED_SYRINGE | Freq: Once | SUBCUTANEOUS | Status: AC
  Administered 2022-05-05: 6 mg via SUBCUTANEOUS
  Filled 2022-05-05: qty 0.6

## 2022-05-05 NOTE — Telephone Encounter (Signed)
Left message with follow-up appointments per 6/28 los. 

## 2022-05-10 ENCOUNTER — Encounter: Payer: Self-pay | Admitting: Nurse Practitioner

## 2022-05-10 ENCOUNTER — Other Ambulatory Visit: Payer: Self-pay | Admitting: Hematology

## 2022-05-11 ENCOUNTER — Other Ambulatory Visit: Payer: Self-pay | Admitting: Hematology

## 2022-05-11 MED ORDER — OXYCODONE HCL 5 MG PO TABS: 5.0000 mg | ORAL_TABLET | ORAL | 0 refills | Status: DC | PRN

## 2022-05-15 ENCOUNTER — Ambulatory Visit (HOSPITAL_COMMUNITY)
Admission: RE | Admit: 2022-05-15 | Discharge: 2022-05-15 | Disposition: A | Discharge status: Home / Self Care | Payer: Medicare Other | Source: Ambulatory Visit | Attending: Nurse Practitioner | Admitting: Nurse Practitioner

## 2022-05-15 DIAGNOSIS — C20 Malignant neoplasm of rectum: Secondary | ICD-10-CM | POA: Insufficient documentation

## 2022-05-15 DIAGNOSIS — C7800 Secondary malignant neoplasm of unspecified lung: Secondary | ICD-10-CM | POA: Diagnosis present

## 2022-05-15 MED ORDER — SODIUM CHLORIDE (PF) 0.9 % IJ SOLN
INTRAMUSCULAR | Status: AC
  Filled 2022-05-15: qty 50

## 2022-05-15 MED ORDER — HEPARIN SOD (PORK) LOCK FLUSH 100 UNIT/ML IV SOLN
INTRAVENOUS | Status: AC
  Administered 2022-05-15: 500 [IU]
  Filled 2022-05-15: qty 5

## 2022-05-15 MED ORDER — IOHEXOL 300 MG/ML  SOLN
100.0000 mL | Freq: Once | INTRAMUSCULAR | Status: AC | PRN
  Administered 2022-05-15: 100 mL via INTRAVENOUS

## 2022-05-15 MED FILL — Dexamethasone Sodium Phosphate Inj 100 MG/10ML: INTRAMUSCULAR | Qty: 1 | Status: AC

## 2022-05-16 ENCOUNTER — Inpatient Hospital Stay: Payer: Medicare Other | Attending: Physician Assistant

## 2022-05-16 ENCOUNTER — Encounter: Payer: Self-pay | Admitting: Hematology

## 2022-05-16 ENCOUNTER — Other Ambulatory Visit: Payer: Self-pay | Admitting: Hematology

## 2022-05-16 ENCOUNTER — Inpatient Hospital Stay (HOSPITAL_BASED_OUTPATIENT_CLINIC_OR_DEPARTMENT_OTHER): Payer: Medicare Other | Admitting: Hematology

## 2022-05-16 ENCOUNTER — Other Ambulatory Visit: Payer: Self-pay

## 2022-05-16 ENCOUNTER — Inpatient Hospital Stay: Payer: Medicare Other

## 2022-05-16 VITALS — BP 166/91 | HR 59 | Temp 98.3°F | Resp 18 | Ht 72.0 in | Wt 150.2 lb

## 2022-05-16 DIAGNOSIS — C7801 Secondary malignant neoplasm of right lung: Secondary | ICD-10-CM | POA: Diagnosis not present

## 2022-05-16 DIAGNOSIS — C7800 Secondary malignant neoplasm of unspecified lung: Secondary | ICD-10-CM

## 2022-05-16 DIAGNOSIS — Z5189 Encounter for other specified aftercare: Secondary | ICD-10-CM | POA: Insufficient documentation

## 2022-05-16 DIAGNOSIS — Z79899 Other long term (current) drug therapy: Secondary | ICD-10-CM | POA: Insufficient documentation

## 2022-05-16 DIAGNOSIS — F17210 Nicotine dependence, cigarettes, uncomplicated: Secondary | ICD-10-CM | POA: Insufficient documentation

## 2022-05-16 DIAGNOSIS — C787 Secondary malignant neoplasm of liver and intrahepatic bile duct: Secondary | ICD-10-CM | POA: Insufficient documentation

## 2022-05-16 DIAGNOSIS — C20 Malignant neoplasm of rectum: Secondary | ICD-10-CM | POA: Diagnosis not present

## 2022-05-16 DIAGNOSIS — Z5112 Encounter for antineoplastic immunotherapy: Secondary | ICD-10-CM | POA: Diagnosis not present

## 2022-05-16 DIAGNOSIS — C775 Secondary and unspecified malignant neoplasm of intrapelvic lymph nodes: Secondary | ICD-10-CM | POA: Insufficient documentation

## 2022-05-16 DIAGNOSIS — Z5111 Encounter for antineoplastic chemotherapy: Secondary | ICD-10-CM | POA: Insufficient documentation

## 2022-05-16 DIAGNOSIS — Z95828 Presence of other vascular implants and grafts: Secondary | ICD-10-CM

## 2022-05-16 DIAGNOSIS — D500 Iron deficiency anemia secondary to blood loss (chronic): Secondary | ICD-10-CM

## 2022-05-16 LAB — COMPREHENSIVE METABOLIC PANEL
ALT: 13 U/L (ref 0–44)
AST: 18 U/L (ref 15–41)
Albumin: 3.5 g/dL (ref 3.5–5.0)
Alkaline Phosphatase: 153 U/L — ABNORMAL HIGH (ref 38–126)
Anion gap: 3 — ABNORMAL LOW (ref 5–15)
BUN: 10 mg/dL (ref 8–23)
CO2: 29 mmol/L (ref 22–32)
Calcium: 8.8 mg/dL — ABNORMAL LOW (ref 8.9–10.3)
Chloride: 104 mmol/L (ref 98–111)
Creatinine, Ser: 1.05 mg/dL (ref 0.61–1.24)
GFR, Estimated: >60 mL/min (ref >60)
Glucose, Bld: 122 mg/dL — ABNORMAL HIGH (ref 70–99)
Potassium: 4.1 mmol/L (ref 3.5–5.1)
Sodium: 136 mmol/L (ref 135–145)
Total Bilirubin: 0.3 mg/dL (ref 0.3–1.2)
Total Protein: 6.5 g/dL (ref 6.5–8.1)

## 2022-05-16 LAB — CBC WITH DIFFERENTIAL/PLATELET
Abs Immature Granulocytes: 0.06 10*3/uL (ref 0.00–0.07)
Basophils Absolute: 0.1 10*3/uL (ref 0.0–0.1)
Basophils Relative: 1 %
Eosinophils Absolute: 0.3 10*3/uL (ref 0.0–0.5)
Eosinophils Relative: 4 %
HCT: 40.3 % (ref 39.0–52.0)
Hemoglobin: 13.4 g/dL (ref 13.0–17.0)
Immature Granulocytes: 1 %
Lymphocytes Relative: 12 %
Lymphs Abs: 0.8 10*3/uL (ref 0.7–4.0)
MCH: 31.2 pg (ref 26.0–34.0)
MCHC: 33.3 g/dL (ref 30.0–36.0)
MCV: 93.9 fL (ref 80.0–100.0)
Monocytes Absolute: 1 10*3/uL (ref 0.1–1.0)
Monocytes Relative: 13 %
Neutro Abs: 4.9 10*3/uL (ref 1.7–7.7)
Neutrophils Relative %: 69 %
Platelets: 145 10*3/uL — ABNORMAL LOW (ref 150–400)
RBC: 4.29 MIL/uL (ref 4.22–5.81)
RDW: 15.2 % (ref 11.5–15.5)
WBC: 7.1 10*3/uL (ref 4.0–10.5)
nRBC: 0 % (ref 0.0–0.2)

## 2022-05-16 LAB — CEA (IN HOUSE-CHCC): CEA (CHCC-In House): 17.69 ng/mL — ABNORMAL HIGH (ref 0.00–5.00)

## 2022-05-16 LAB — FERRITIN: Ferritin: 234 ng/mL (ref 24–336)

## 2022-05-16 MED ORDER — SODIUM CHLORIDE 0.9 % IV SOLN
5.0000 mg/kg | Freq: Once | INTRAVENOUS | Status: AC
  Administered 2022-05-16: 350 mg via INTRAVENOUS
  Filled 2022-05-16: qty 14

## 2022-05-16 MED ORDER — PALONOSETRON HCL INJECTION 0.25 MG/5ML
0.2500 mg | Freq: Once | INTRAVENOUS | Status: AC
  Administered 2022-05-16: 0.25 mg via INTRAVENOUS
  Filled 2022-05-16: qty 5

## 2022-05-16 MED ORDER — SODIUM CHLORIDE 0.9% FLUSH
10.0000 mL | Freq: Once | INTRAVENOUS | Status: AC
  Administered 2022-05-16: 10 mL

## 2022-05-16 MED ORDER — SODIUM CHLORIDE 0.9 % IV SOLN
2400.0000 mg/m2 | INTRAVENOUS | Status: DC
  Administered 2022-05-16: 4450 mg via INTRAVENOUS
  Filled 2022-05-16: qty 89

## 2022-05-16 MED ORDER — SODIUM CHLORIDE 0.9 % IV SOLN: Freq: Once | INTRAVENOUS | Status: AC

## 2022-05-16 MED ORDER — SODIUM CHLORIDE 0.9 % IV SOLN
400.0000 mg/m2 | Freq: Once | INTRAVENOUS | Status: AC
  Administered 2022-05-16: 744 mg via INTRAVENOUS
  Filled 2022-05-16: qty 17.5

## 2022-05-16 MED ORDER — ATROPINE SULFATE 1 MG/ML IV SOLN
0.5000 mg | Freq: Once | INTRAVENOUS | Status: AC | PRN
  Administered 2022-05-16: 0.5 mg via INTRAVENOUS
  Filled 2022-05-16: qty 1

## 2022-05-16 MED ORDER — SODIUM CHLORIDE 0.9 % IV SOLN
10.0000 mg | Freq: Once | INTRAVENOUS | Status: AC
  Administered 2022-05-16: 10 mg via INTRAVENOUS
  Filled 2022-05-16: qty 10

## 2022-05-16 MED ORDER — SODIUM CHLORIDE 0.9 % IV SOLN
160.0000 mg/m2 | Freq: Once | INTRAVENOUS | Status: AC
  Administered 2022-05-16: 300 mg via INTRAVENOUS
  Filled 2022-05-16: qty 15

## 2022-05-16 NOTE — Patient Instructions (Signed)
[facility] MEDICAL ONCOLOGY  Discharge Instructions: Thank you for choosing [facility] to provide your oncology and hematology care.   If you have a lab appointment with the Cancer Center, please go directly to the Cancer Center and check in at the registration area.   Wear comfortable clothing and clothing appropriate for easy access to any Portacath or PICC line.   We strive to give you quality time with your provider. You may need to reschedule your appointment if you arrive late (15 or more minutes).  Arriving late affects you and other patients whose appointments are after yours.  Also, if you miss three or more appointments without notifying the office, you may be dismissed from the clinic at the provider's discretion.      For prescription refill requests, have your pharmacy contact our office and allow 72 hours for refills to be completed.    Today you received the following chemotherapy and/or immunotherapy agents: Zirabev/Irinotecan/Leucovorin/Fluorouracil      To help prevent nausea and vomiting after your treatment, we encourage you to take your nausea medication as directed.  BELOW ARE SYMPTOMS THAT SHOULD BE REPORTED IMMEDIATELY: *FEVER GREATER THAN 100.4 F (38 C) OR HIGHER *CHILLS OR SWEATING *NAUSEA AND VOMITING THAT IS NOT CONTROLLED WITH YOUR NAUSEA MEDICATION *UNUSUAL SHORTNESS OF BREATH *UNUSUAL BRUISING OR BLEEDING *URINARY PROBLEMS (pain or burning when urinating, or frequent urination) *BOWEL PROBLEMS (unusual diarrhea, constipation, pain near the anus) TENDERNESS IN MOUTH AND THROAT WITH OR WITHOUT PRESENCE OF ULCERS (sore throat, sores in mouth, or a toothache) UNUSUAL RASH, SWELLING OR PAIN  UNUSUAL VAGINAL DISCHARGE OR ITCHING   Items with * indicate a potential emergency and should be followed up as soon as possible or go to the Emergency Department if any problems should occur.  Please show the CHEMOTHERAPY ALERT CARD or  IMMUNOTHERAPY ALERT CARD at check-in to the Emergency Department and triage nurse.  Should you have questions after your visit or need to cancel or reschedule your appointment, please contact [facility]ICAL [facility]COL[facility]  Dept: [phone]  and follow the prompts.  Office hours are 8:00 a.m. to 4:30 p.m. Monday - Friday. Please note that voicemails left after 4:00 p.m. may not be returned until the following business day.  We are closed weekends and major holidays. You have access to a nurse at all times for urgent questions. Please call the main number to the clinic Dept: [phone] and follow the prompts.   For any non-urgent questions, you may also contact your provider using MyChart. We now offer e-Visits for anyone 18 and older to request care online for non-urgent symptoms. For details visit [email or web address].   Also download the MyChart app! Go to the app store, search "MyChart", open the app, select [facility], and log in with your MyChart username and password.  Due to Covid, a mask is required upon entering the hospital/clinic. If you do not have a mask, one will be given to you upon arrival. For doctor visits, patients may have 1 support Drayson Dorko aged 18 or older with them. For treatment visits, patients cannot have anyone with them due to current Covid guidelines and our immunocompromised population.   The chemotherapy medication bag should finish at 46 hours, 96 hours, or 7 days. For example, if your pump is scheduled for 46 hours and it was put on at 4:00 p.m., it should finish at 2:00 p.m. the day it is scheduled to come off regardless of your   appointment time.     Estimated time to finish at 08:45 on Saturday 04/08/2022   If the display on your pump reads "Low Volume" and it is beeping, take the batteries out of the pump and come to the cancer center for it to be taken off.   If the pump alarms go off prior to the pump reading "Low Volume" then call [phone]  and someone can assist you.  If the plunger comes out and the chemotherapy medication is leaking out, please use your home chemo spill kit to clean up the spill. Do NOT use paper towels or other household products.  If you have problems or questions regarding your pump, please call either [phone] (24 hours a day) or the cancer center Monday-Friday 8:00 a.m.- 4:30 p.m. at the clinic number and we will assist you. If you are unable to get assistance, then go to the nearest Emergency Department and ask the staff to contact the IV team for assistance.

## 2022-05-16 NOTE — Progress Notes (Signed)
[place]   Telephone:([phone]) [phone] Fax:(336) [phone]   Clinic Follow up Note   Patient Care Team: Pcp, No as PCP - General  Date of Service:  05/16/2022  CHIEF COMPLAINT: f/u of metastatic rectal cancer  CURRENT THERAPY:  Second-line FOLFIRI, q2weeks, started 02/22/22 -Bevacizumab started 11/24/21  ASSESSMENT & PLAN:  Jorge [name] is a 71 y.o. male with   1. 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 CT AP 07/23/21 showed rectal mass with no significant adenopathy or distant metastasis. Colonoscopy 08/19/21 showed a 7 cm infiltrative mass in the mid rectum (6-13cm from anal verge), pathology showed adenocarcinoma. MMR is normal -staging pelvic MRI 08/27/21 showed: staging at T4b; N2 likely associated with early extra mesorectal lymph node involvement; distance from tumor to internal anal sphincter is 1.2 cm; also with extramural venous involvement. He has biopsy proven lung mets -baseline CEA 571.84 on 09/06/21. -he received concurrent chemoRT with Xeloda 11/1-11/21/22 under the care of Dr. [name].  He responded clinically, no recurrent rectal bleeding, his pain has improved  -He began first-line CAPOX and bevacizumab on 10/14/21, taking Xeloda 1500 mg BID 2 weeks on/1 week off. He tolerated very well overall.  -Cycle 2 CAPOX delayed due to elevated liver enzymes, resumed with dose-reduced Xeloda upon improvement, takes Xeloda at 1000 mg a.m. and 1500 mg p.m. for 14 days. He is tolerating this dose well. -restaging CT CAP on 01/23/22 showed: decrease in size of pulmonary nodules; approximately 10 new liver lesions. -further evaluation with abdomen MRI on 02/10/22 showed: multiple indeterminate liver lesions, new compared to prior CT in 07/2021.  -we switched to second-line FOLFIRI, with continued beva, on 02/21/22, with dose-reduced irinotecan for cycle 1. He tolerated well  overall. -I personally reviewed his restaging CT scan from yesterday, which showed partial response with decreased lung metastasis, stable liver lesions, no other new or progressive disease.  I discussed the findings with patient and his daughter on the phone. 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  2. Symptom Management: HTN, Weight loss -elevated BP secondary to beva -we started him on low-dose amlodipine at his last visit on 03/02/22. He is compliant and endorses taking daily. -he is slowly gaining weight back, but he hopes to gain back the weight he lost (baseline ~160 lbs)   3. Multiple colon polyps -He has a large 3.5 cm polyps in the hepatic flexure, biopsy showed tubular adenoma. He is scheduled for f/u with Dr. [name] on 12/13/21.  They discussed polypectomy/resection to remove it -In the context of his stage IV rectal cancer, he prefers to defer resection for now, which we agree -He had additional 4 small polyps which were removed, path showed tubular adenomas.   4. Pain and narcotics  -Patient states his rectal pain has resolved.  He no longer requires narcotics but does have oxycodone to use as needed for pain in feet and headache. I advised him to use tylenol for more minor pains or headaches. -I previously spoke with his daughter to keep an eye on his pain and meds     PLAN: -Restaging CT scan showed partial response, I reviewed with patient and his daughter  -Labs reviewed, well proceed with C7 FOLFIRI with same dose and bevacizumab today -increase grbapentin to 25m bid gradually for his neuropathy, will continue take oxycodone as needed 2-3 times  a day   No problem-specific Assessment & Plan notes found for this encounter.   SUMMARY OF ONCOLOGIC HISTORY: Oncology History Overview Note  Cancer Staging Rectal cancer [facility]) Staging  form: Colon and Rectum, AJCC 8th Edition - Clinical stage from 08/29/2021: [name], cM1 - Signed by [name], MD on 08/29/2021    Rectal cancer ([place])  07/23/2021 Imaging   CT AP  IMPRESSION: 1. Appearance of the rectum likely indicates a rectal mass suspicious for rectal carcinoma. Inflammatory process would be less likely. Direct visualization is suggested. 2. Cholelithiasis without evidence of acute cholecystitis. 3. Aortic atherosclerosis.   08/19/2021 Procedure   Colonoscopy, under Dr. [name]  Impression: - Rectal tenderness, palpable rectal mass and hemorrhoids found on digital rectal exam. - Stool in the entire examined colon - lavaged with still inadequate clearance. - One 35 mm polyp at the hepatic flexure. Biopsied. Tattooed distal in case future endoscopic resection is considered - however, has larger issues with rectal mass currently as below. - Four, 5 to 11 mm polyps in the transverse colon and at the hepatic flexure, removed with a cold snare. Resected and retrieved. - Diverticulosis in the recto-sigmoid colon and in the sigmoid colon. - Rule out malignancy, partially obstructing tumor in the rectum and from 6 to 13 cm proximal to the anus. Biopsied.   08/19/2021 Pathology Results   Diagnosis 1. Hepatic Flexure Biopsy - TUBULAR ADENOMA WITHOUT HIGH-GRADE DYSPLASIA OR MALIGNANCY 2. Transverse Colon Biopsy, and hepatic flexure, polyps (4) - TUBULAR ADENOMA WITHOUT HIGH-GRADE DYSPLASIA OR MALIGNANCY - OTHER FRAGMENTS OF POLYPOID COLONIC MUCOSA WITH NO SPECIFIC HISTOPATHOLOGIC CHANGES - FOOD MATERIAL 3. Rectum, biopsy - ADENOCARCINOMA. SEE NOTE   08/22/2021 Initial Diagnosis   Rectal cancer ([place])   08/26/2021 Imaging   IMPRESSION: 1. A 2.2 x 1.9 cm right middle lobe pulmonary nodule as well as a total of four right upper lobe pulmonary nodule and masses measuring up to 3.6 cm. Findings concerning for metastatic primary lung cancer versus less likely  metastases in a patient with rectal cancer. Additional imaging evaluation or consultation with Pulmonology or Thoracic Surgery recommended. 2. No gross hilar adenopathy, noting limited sensitivity for the detection of hilar adenopathy on this noncontrast study. 3. Cholelithiasis. 4.  Emphysema (ICD10-J43.9). 5. At least left anterior descending coronary artery calcifications.   08/27/2021 Imaging   IMPRESSION: Rectal adenocarcinoma T stage: T4 B   Rectal adenocarcinoma N stage: N2 disease likely associated with early extra mesorectal lymph node involvement.   Distance from tumor to the internal anal sphincter is 1.2 cm.   Also with extramural venous involvement as described.   08/29/2021 Cancer Staging   Staging form: Colon and Rectum, AJCC 8th Edition - Clinical stage from 08/29/2021: [name], cM1 - Signed by [name], MD on 08/29/2021 Stage prefix: Initial diagnosis   01/23/2022 Imaging   CT CAP w contrast IMPRESSION: 1. Mild interval decrease in size of multiple right-sided pulmonary nodules. No new suspicious pulmonary nodule or mass. 2. Interval development of approximately 10 new ill-defined hypoattenuating liver lesions ranging in size from approximately 5-10 mm. Imaging features suspicious for metastatic disease. MRI abdomen with and without contrast may prove helpful to further evaluate. 3. Similar appearance of wall thickening in the rectum with perirectal edema. 4. Cholelithiasis. 5. Prostatomegaly. 6. Aortic Atherosclerosis (ICD10-I70.0).   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 [facility])  10/06/2021 Initial Diagnosis   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 [facility])   10/14/2021 - 01/25/2022 Chemotherapy   Patient is on Treatment  Plan : COLORECTAL CapeOx + Bevacizumab q21d     02/21/2022 -  Chemotherapy   Patient is on Treatment Plan : COLORECTAL FOLFIRI / BEVACIZUMAB Q14D        INTERVAL HISTORY:  Jorge [name] is here for a follow up of metastatic rectal  cancer. He was last seen by NP Lacie 3 weeks ago. He presents to clinic by himself.   He reports numbness and tingling in his hands and feet. It has been going on for 3-4 weeks, he is on gabapentin but only on 11m HS, and he run out a week ago. He has refilled now. He has been taking oxycodone 2-3 tabs a day, which I refilled last week.  He is otherwise doing well, has good appetite and energy level.  He is functioning well at home.   All other systems were reviewed with the patient and are negative.  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 ([place] 08/19/2021    SURGICAL HISTORY: Past Surgical History:  Procedure Laterality Date   BRONCHIAL BIOPSY  09/27/2021   Procedure: BRONCHIAL BIOPSIES;  Surgeon: IGarner Nash DO;  [place] [facility];  Service: Pulmonary;;   BRONCHIAL BRUSHINGS  09/27/2021   Procedure: BRONCHIAL BRUSHINGS;  Surgeon: IGarner Nash DO;  [place] [facility];  Service: Pulmonary;;   BRONCHIAL NEEDLE ASPIRATION BIOPSY  09/27/2021   Procedure: BRONCHIAL NEEDLE ASPIRATION BIOPSIES;  Surgeon: IGarner Nash DO;  [place] [facility]  Service: Pulmonary;;   COLONOSCOPY     IR IMAGING GUIDED PORT INSERTION  10/24/2021   left breast surgery Left 1968   VIDEO BRONCHOSCOPY WITH RADIAL ENDOBRONCHIAL ULTRASOUND  09/27/2021   Procedure: RADIAL ENDOBRONCHIAL ULTRASOUND;  Surgeon: IGarner Nash DO;  [facility];  Service: Pulmonary;;    I have reviewed the social history and family history with the patient and they are unchanged from previous note.  ALLERGIES:  has No Known Allergies.  MEDICATIONS:  Current Outpatient Medications  Medication Sig Dispense Refill   amLODipine (NORVASC) 5 MG tablet TAKE 1 TABLET (5 MG TOTAL) BY MOUTH DAILY. 30 tablet 0   carbonyl iron (FEOSOL) 45 MG TABS tablet Take 45 mg by mouth daily.     gabapentin (NEURONTIN) 100 MG  capsule Take 1 capsule (100 mg total) by mouth at bedtime. 30 capsule 0   Multiple Vitamin (MULTIVITAMIN WITH MINERALS) TABS tablet Take 1 tablet by mouth daily.     ondansetron (ZOFRAN) 8 MG tablet Take 1 tablet (8 mg total) by mouth every 8 (eight) hours as needed for nausea or vomiting. 30 tablet 0   oxyCODONE (OXY IR/ROXICODONE) 5 MG immediate release tablet Take 1 tablet (5 mg total) by mouth every 4 (four) hours as needed for severe pain. 90 tablet 0   No current facility-administered medications for this visit.   Facility-Administered Medications Ordered in Other Visits  Medication Dose Route Frequency Provider Last Rate Last Admin   fluorouracil (ADRUCIL) 4,450 mg in sodium chloride 0.9 % 61 mL chemo infusion  2,400 mg/m2 (Treatment Plan Recorded) Intravenous 1 day or 1 dose FTruitt Merle MD   Infusion Verify at 05/16/22 1524    PHYSICAL EXAMINATION: ECOG PERFORMANCE STATUS: 1 - Symptomatic but completely ambulatory  Vitals:   05/16/22 1054  BP: (!) 166/91  Pulse: (!) 59  Resp: 18  Temp: 98.3 F (36.8 C)  SpO2: 99%   Wt Readings from Last 3  Encounters:  05/16/22 150 lb 3.2 oz (68.1 kg)  05/03/22 147 lb 4.8 oz (66.8 kg)  04/20/22 149 lb 9.6 oz (67.9 kg)     GENERAL:alert, no distress and comfortable SKIN: skin color normal, no rashes or significant lesions EYES: normal, Conjunctiva are pink and non-injected, sclera clear  NEURO: alert & oriented x 3 with fluent speech  LABORATORY DATA:  I have reviewed the data as listed    Latest Ref Rng & Units 05/16/2022   10:19 AM 05/03/2022    8:48 AM 04/20/2022    8:05 AM  CBC  WBC 4.0 - 10.5 K/uL 7.1  6.8  5.8   Hemoglobin 13.0 - 17.0 g/dL 13.4  14.1  14.4   Hematocrit 39.0 - 52.0 % 40.3  42.5  43.2   Platelets 150 - 400 K/uL 145  165  134         Latest Ref Rng & Units 05/16/2022   10:19 AM 05/03/2022    8:48 AM 04/20/2022    8:05 AM  CMP  Glucose 70 - 99 mg/dL 122  196  92   BUN 8 - 23 mg/dL '10  12  10   ' Creatinine  0.61 - 1.24 mg/dL 1.05  0.95  0.89   Sodium 135 - 145 mmol/L 136  136  140   Potassium 3.5 - 5.1 mmol/L 4.1  4.1  3.9   Chloride 98 - 111 mmol/L 104  104  107   CO2 22 - 32 mmol/L '29  29  29   ' Calcium 8.9 - 10.3 mg/dL 8.8  9.0  9.3   Total Protein 6.5 - 8.1 g/dL 6.5  6.5  6.9   Total Bilirubin 0.3 - 1.2 mg/dL 0.3  0.3  0.3   Alkaline Phos 38 - 126 U/L 153  147  157   AST 15 - 41 U/L '18  19  19   ' ALT 0 - 44 U/L '13  14  15       ' RADIOGRAPHIC STUDIES: I have personally reviewed the radiological images as listed and agreed with the findings in the report. CT CHEST ABDOMEN PELVIS W CONTRAST  Result Date: 05/15/2022 CLINICAL DATA:  Follow-up metastatic rectal carcinoma. Evaluate treatment response. * Tracking Code: BO * EXAM: CT CHEST, ABDOMEN, AND PELVIS WITH CONTRAST TECHNIQUE: Multidetector CT imaging of the chest, abdomen and pelvis was performed following the standard protocol during bolus administration of intravenous contrast. RADIATION DOSE REDUCTION: This exam was performed according to the departmental dose-optimization program which includes automated exposure control, adjustment of the mA and/or kV according to patient size and/or use of iterative reconstruction technique. CONTRAST:  152m OMNIPAQUE IOHEXOL 300 MG/ML  SOLN COMPARISON:  01/23/2022 FINDINGS: CT CHEST FINDINGS Cardiovascular: No acute findings. Mediastinum/Lymph Nodes: No masses or pathologically enlarged lymph nodes identified. Lungs/Pleura: Several macrolobulated and spiculated nodules are again seen in the right upper and middle lobes. The largest index nodule in the inferior right upper lobe currently measures 2.0 x 1.7 cm on image 79/4, compared to 2.3 x 1.8 cm previously. A few scattered sub-cm pulmonary nodules are noted bilaterally, which show no significant change. No new or enlarging pulmonary nodules or masses identified. Musculoskeletal:  No suspicious bone lesions identified. CT ABDOMEN AND PELVIS FINDINGS  Hepatobiliary: Multiple small hypovascular liver lesions in both the right and left hepatic lobes show no significant change. Dominant lesion in the posterolateral dome of the right lobe measures 10 mm, without significant change. No new or enlarging liver lesions  identified. Numerous tiny sub-cm calcified gallstones are again seen, however there is no evidence of cholecystitis or biliary ductal dilatation. Pancreas:  No mass or inflammatory changes. Spleen:  Within normal limits in size and appearance. Adrenals/Urinary tract:  No masses or hydronephrosis. Stomach/Bowel: Concentric wall thickening of the lower rectum is again seen as well as adjacent presacral soft tissue stranding, without significant change. No evidence of obstruction, inflammatory process, or abnormal fluid collections. Vascular/Lymphatic: No pathologically enlarged lymph nodes identified. No acute vascular findings. Aortic atherosclerotic calcification incidentally noted. Reproductive:  No mass or other significant abnormality identified. Other:  None. Musculoskeletal:  No suspicious bone lesions identified. IMPRESSION: Slight decrease in size of right lung metastases. Stable small hypovascular liver lesions, highly suspicious for metastatic disease. Stable concentric wall thickening of lower rectum and adjacent presacral soft tissue stranding. No evidence of new or progressive disease. Cholelithiasis. No radiographic evidence of cholecystitis. Aortic Atherosclerosis (ICD10-I70.0). Electronically Signed   By: Marlaine Hind M.D.   On: 05/15/2022 11:50      No orders of the defined types were placed in this encounter.  All questions were answered. The patient knows to call the clinic with any problems, questions or concerns. No barriers to learning was detected. The total time spent in the appointment was 30 minutes.     [name], MD 05/16/2022

## 2022-05-17 ENCOUNTER — Telehealth: Payer: Self-pay | Admitting: Hematology

## 2022-05-17 NOTE — Telephone Encounter (Signed)
Scheduled follow-up appointments per 7/11 los. Patient's daughter is aware.

## 2022-05-18 ENCOUNTER — Inpatient Hospital Stay: Payer: Medicare Other

## 2022-05-18 ENCOUNTER — Other Ambulatory Visit: Payer: Self-pay

## 2022-05-18 VITALS — BP 131/77 | HR 66 | Temp 98.1°F | Resp 16

## 2022-05-18 DIAGNOSIS — C7800 Secondary malignant neoplasm of unspecified lung: Secondary | ICD-10-CM

## 2022-05-18 DIAGNOSIS — Z5112 Encounter for antineoplastic immunotherapy: Secondary | ICD-10-CM | POA: Diagnosis not present

## 2022-05-18 MED ORDER — PEGFILGRASTIM-JMDB 6 MG/0.6ML ~~LOC~~ SOSY
6.0000 mg | PREFILLED_SYRINGE | Freq: Once | SUBCUTANEOUS | Status: AC
  Administered 2022-05-18: 6 mg via SUBCUTANEOUS
  Filled 2022-05-18: qty 0.6

## 2022-05-18 MED ORDER — HEPARIN SOD (PORK) LOCK FLUSH 100 UNIT/ML IV SOLN
500.0000 [IU] | Freq: Once | INTRAVENOUS | Status: AC | PRN
  Administered 2022-05-18: 500 [IU]

## 2022-05-18 MED ORDER — SODIUM CHLORIDE 0.9% FLUSH
10.0000 mL | INTRAVENOUS | Status: DC | PRN
  Administered 2022-05-18: 10 mL

## 2022-05-26 ENCOUNTER — Other Ambulatory Visit: Payer: Self-pay | Admitting: Hematology

## 2022-05-28 ENCOUNTER — Other Ambulatory Visit: Payer: Self-pay | Admitting: Hematology

## 2022-05-28 ENCOUNTER — Encounter: Payer: Self-pay | Admitting: Hematology

## 2022-05-28 ENCOUNTER — Telehealth: Payer: Self-pay | Admitting: Hematology

## 2022-05-28 DIAGNOSIS — C20 Malignant neoplasm of rectum: Secondary | ICD-10-CM

## 2022-05-28 MED ORDER — GABAPENTIN 100 MG PO CAPS: 200.0000 mg | ORAL_CAPSULE | Freq: Two times a day (BID) | ORAL | 0 refills | Status: DC

## 2022-05-28 NOTE — Telephone Encounter (Signed)
I received a oxycodone refill on 7/21, which is too early to refill (last #90 on 7/6). Per last visit, pt is taking 2-3 tab oxycodone daily. I called his daughter who lives with him and monitor his narcotics using. I will not refill his oxycodone but will refill gabapentin today and will refer him to palliative care NP Nikke to manage his pain. I strongly encourage her to come with him for his appointment, she voiced good understanding and agrees with the plan.  Jorge [name]  05/28/2022

## 2022-05-29 ENCOUNTER — Other Ambulatory Visit: Payer: Self-pay

## 2022-05-29 ENCOUNTER — Encounter: Payer: Self-pay | Admitting: Hematology

## 2022-05-29 ENCOUNTER — Other Ambulatory Visit: Payer: Self-pay | Admitting: Hematology

## 2022-05-29 ENCOUNTER — Telehealth: Payer: Self-pay

## 2022-05-29 MED ORDER — OXYCODONE HCL 5 MG PO TABS
5.0000 mg | ORAL_TABLET | Freq: Four times a day (QID) | ORAL | 0 refills | Status: DC | PRN
Start: 2022-05-29 — End: 2022-06-02

## 2022-05-29 MED FILL — Dexamethasone Sodium Phosphate Inj 100 MG/10ML: INTRAMUSCULAR | Qty: 1 | Status: AC

## 2022-05-29 NOTE — Telephone Encounter (Signed)
Spoke with pt daughter regarding upcoming palliative appt, confirmed scheduling, provided information on palliative services, no further questions or concerns.

## 2022-05-30 ENCOUNTER — Other Ambulatory Visit: Payer: Self-pay

## 2022-05-30 ENCOUNTER — Encounter: Payer: Self-pay | Admitting: Hematology

## 2022-05-30 ENCOUNTER — Inpatient Hospital Stay (HOSPITAL_BASED_OUTPATIENT_CLINIC_OR_DEPARTMENT_OTHER): Payer: Medicare Other | Admitting: Hematology

## 2022-05-30 ENCOUNTER — Inpatient Hospital Stay: Payer: Medicare Other

## 2022-05-30 VITALS — BP 162/80 | HR 64 | Temp 98.4°F | Resp 18

## 2022-05-30 VITALS — BP 169/87 | HR 62 | Temp 98.4°F | Resp 18 | Ht 72.0 in | Wt 154.5 lb

## 2022-05-30 DIAGNOSIS — Z5112 Encounter for antineoplastic immunotherapy: Secondary | ICD-10-CM | POA: Diagnosis not present

## 2022-05-30 DIAGNOSIS — C7800 Secondary malignant neoplasm of unspecified lung: Secondary | ICD-10-CM

## 2022-05-30 DIAGNOSIS — C20 Malignant neoplasm of rectum: Secondary | ICD-10-CM

## 2022-05-30 DIAGNOSIS — Z95828 Presence of other vascular implants and grafts: Secondary | ICD-10-CM

## 2022-05-30 LAB — CBC WITH DIFFERENTIAL/PLATELET
Abs Immature Granulocytes: 0.07 10*3/uL (ref 0.00–0.07)
Basophils Absolute: 0.1 10*3/uL (ref 0.0–0.1)
Basophils Relative: 1 %
Eosinophils Absolute: 0.3 10*3/uL (ref 0.0–0.5)
Eosinophils Relative: 4 %
HCT: 40.7 % (ref 39.0–52.0)
Hemoglobin: 13.5 g/dL (ref 13.0–17.0)
Immature Granulocytes: 1 %
Lymphocytes Relative: 12 %
Lymphs Abs: 0.9 10*3/uL (ref 0.7–4.0)
MCH: 30.8 pg (ref 26.0–34.0)
MCHC: 33.2 g/dL (ref 30.0–36.0)
MCV: 92.9 fL (ref 80.0–100.0)
Monocytes Absolute: 1 10*3/uL (ref 0.1–1.0)
Monocytes Relative: 12 %
Neutro Abs: 5.4 10*3/uL (ref 1.7–7.7)
Neutrophils Relative %: 70 %
Platelets: 149 10*3/uL — ABNORMAL LOW (ref 150–400)
RBC: 4.38 MIL/uL (ref 4.22–5.81)
RDW: 15.3 % (ref 11.5–15.5)
WBC: 7.7 10*3/uL (ref 4.0–10.5)
nRBC: 0 % (ref 0.0–0.2)

## 2022-05-30 LAB — COMPREHENSIVE METABOLIC PANEL
ALT: 15 U/L (ref 0–44)
AST: 20 U/L (ref 15–41)
Albumin: 3.5 g/dL (ref 3.5–5.0)
Alkaline Phosphatase: 179 U/L — ABNORMAL HIGH (ref 38–126)
Anion gap: 3 — ABNORMAL LOW (ref 5–15)
BUN: 9 mg/dL (ref 8–23)
CO2: 30 mmol/L (ref 22–32)
Calcium: 8.7 mg/dL — ABNORMAL LOW (ref 8.9–10.3)
Chloride: 106 mmol/L (ref 98–111)
Creatinine, Ser: 1.09 mg/dL (ref 0.61–1.24)
GFR, Estimated: >60 mL/min (ref >60)
Glucose, Bld: 98 mg/dL (ref 70–99)
Potassium: 4.5 mmol/L (ref 3.5–5.1)
Sodium: 139 mmol/L (ref 135–145)
Total Bilirubin: 0.3 mg/dL (ref 0.3–1.2)
Total Protein: 6.5 g/dL (ref 6.5–8.1)

## 2022-05-30 MED ORDER — ATROPINE SULFATE 1 MG/ML IV SOLN
0.5000 mg | Freq: Once | INTRAVENOUS | Status: AC
  Administered 2022-05-30: 0.5 mg via INTRAVENOUS
  Filled 2022-05-30: qty 1

## 2022-05-30 MED ORDER — SODIUM CHLORIDE 0.9% FLUSH
10.0000 mL | Freq: Once | INTRAVENOUS | Status: AC
  Administered 2022-05-30: 10 mL

## 2022-05-30 MED ORDER — SODIUM CHLORIDE 0.9 % IV SOLN
5.0000 mg/kg | Freq: Once | INTRAVENOUS | Status: AC
  Administered 2022-05-30: 350 mg via INTRAVENOUS
  Filled 2022-05-30: qty 14

## 2022-05-30 MED ORDER — SODIUM CHLORIDE 0.9 % IV SOLN
400.0000 mg/m2 | Freq: Once | INTRAVENOUS | Status: AC
  Administered 2022-05-30: 744 mg via INTRAVENOUS
  Filled 2022-05-30: qty 25

## 2022-05-30 MED ORDER — SODIUM CHLORIDE 0.9 % IV SOLN: Freq: Once | INTRAVENOUS | Status: AC

## 2022-05-30 MED ORDER — SODIUM CHLORIDE 0.9 % IV SOLN
10.0000 mg | Freq: Once | INTRAVENOUS | Status: AC
  Administered 2022-05-30: 10 mg via INTRAVENOUS
  Filled 2022-05-30: qty 10

## 2022-05-30 MED ORDER — SODIUM CHLORIDE 0.9% FLUSH: 10.0000 mL | INTRAVENOUS | Status: DC | PRN

## 2022-05-30 MED ORDER — HEPARIN SOD (PORK) LOCK FLUSH 100 UNIT/ML IV SOLN: 500.0000 [IU] | Freq: Once | INTRAVENOUS | Status: DC | PRN

## 2022-05-30 MED ORDER — PALONOSETRON HCL INJECTION 0.25 MG/5ML
0.2500 mg | Freq: Once | INTRAVENOUS | Status: AC
  Administered 2022-05-30: 0.25 mg via INTRAVENOUS
  Filled 2022-05-30: qty 5

## 2022-05-30 MED ORDER — SODIUM CHLORIDE 0.9 % IV SOLN
2400.0000 mg/m2 | INTRAVENOUS | Status: DC
  Administered 2022-05-30: 4450 mg via INTRAVENOUS
  Filled 2022-05-30: qty 89

## 2022-05-30 MED ORDER — SODIUM CHLORIDE 0.9 % IV SOLN
160.0000 mg/m2 | Freq: Once | INTRAVENOUS | Status: AC
  Administered 2022-05-30: 300 mg via INTRAVENOUS
  Filled 2022-05-30: qty 15

## 2022-05-30 NOTE — Patient Instructions (Signed)
[facility] MEDICAL ONCOLOGY  Discharge Instructions: Thank you for choosing [facility] to provide your oncology and hematology care.   If you have a lab appointment with the Cancer Center, please go directly to the Cancer Center and check in at the registration area.   Wear comfortable clothing and clothing appropriate for easy access to any Portacath or PICC line.   We strive to give you quality time with your provider. You may need to reschedule your appointment if you arrive late (15 or more minutes).  Arriving late affects you and other patients whose appointments are after yours.  Also, if you miss three or more appointments without notifying the office, you may be dismissed from the clinic at the provider's discretion.      For prescription refill requests, have your pharmacy contact our office and allow 72 hours for refills to be completed.    Today you received the following chemotherapy and/or immunotherapy agents: Zirabev/Irinotecan/Leucovorin/Fluorouracil      To help prevent nausea and vomiting after your treatment, we encourage you to take your nausea medication as directed.  BELOW ARE SYMPTOMS THAT SHOULD BE REPORTED IMMEDIATELY: *FEVER GREATER THAN 100.4 F (38 C) OR HIGHER *CHILLS OR SWEATING *NAUSEA AND VOMITING THAT IS NOT CONTROLLED WITH YOUR NAUSEA MEDICATION *UNUSUAL SHORTNESS OF BREATH *UNUSUAL BRUISING OR BLEEDING *URINARY PROBLEMS (pain or burning when urinating, or frequent urination) *BOWEL PROBLEMS (unusual diarrhea, constipation, pain near the anus) TENDERNESS IN MOUTH AND THROAT WITH OR WITHOUT PRESENCE OF ULCERS (sore throat, sores in mouth, or a toothache) UNUSUAL RASH, SWELLING OR PAIN  UNUSUAL VAGINAL DISCHARGE OR ITCHING   Items with * indicate a potential emergency and should be followed up as soon as possible or go to the Emergency Department if any problems should occur.  Please show the CHEMOTHERAPY ALERT CARD or  IMMUNOTHERAPY ALERT CARD at check-in to the Emergency Department and triage nurse.  Should you have questions after your visit or need to cancel or reschedule your appointment, please contact [facility]  Dept: [phone]  and follow the prompts.  Office hours are 8:00 a.m. to 4:30 p.m. Monday - Friday. Please note that voicemails left after 4:00 p.m. may not be returned until the following business day.  We are closed weekends and major holidays. You have access to a nurse at all times for urgent questions. Please call the main number to the clinic Dept: [phone] and follow the prompts.   For any non-urgent questions, you may also contact your provider using MyChart. We now offer e-Visits for anyone 18 and older to request care online for non-urgent symptoms. For details visit [email or web address].   Also download the MyChart app! Go to the app store, search "MyChart", open the app, select [facility], and log in with your MyChart username and password.  Due to Covid, a mask is required upon entering the hospital/clinic. If you do not have a mask, one will be given to you upon arrival. For doctor visits, patients may have 1 support Audrinna Sherman aged 18 or older with them. For treatment visits, patients cannot have anyone with them due to current Covid guidelines and our immunocompromised population.   The chemotherapy medication bag should finish at 46 hours, 96 hours, or 7 days. For example, if your pump is scheduled for 46 hours and it was put on at 4:00 p.m., it should finish at 2:00 p.m. the day it is scheduled to come off regardless of your   appointment time.     Estimated time to finish at 08:45 on Saturday 04/08/2022   If the display on your pump reads "Low Volume" and it is beeping, take the batteries out of the pump and come to the cancer center for it to be taken off.   If the pump alarms go off prior to the pump reading "Low Volume" then call [phone]  and someone can assist you.  If the plunger comes out and the chemotherapy medication is leaking out, please use your home chemo spill kit to clean up the spill. Do NOT use paper towels or other household products.  If you have problems or questions regarding your pump, please call either [phone] (24 hours a day) or the cancer center Monday-Friday 8:00 a.m.- 4:30 p.m. at the clinic number and we will assist you. If you are unable to get assistance, then go to the nearest Emergency Department and ask the staff to contact the IV team for assistance.

## 2022-05-30 NOTE — Progress Notes (Signed)
[place]   Telephone:([phone]) [phone] Fax:([phone]) [phone]   Clinic Follow up Note   Patient Care Team: Pcp, No as PCP - General  Date of Service:  05/30/2022  CHIEF COMPLAINT: f/u of metastatic rectal cancer  CURRENT THERAPY:  Second-line FOLFIRI, q2weeks, started 02/22/22 -Bevacizumab started 11/24/21  ASSESSMENT & PLAN:  Jorge [name] is a 71 y.o. male with   1. 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 CT AP 07/23/21 showed rectal mass with no significant adenopathy or distant metastasis. Colonoscopy 08/19/21 showed a 7 cm infiltrative mass in the mid rectum (6-13cm from anal verge), pathology showed adenocarcinoma. MMR is normal -staging pelvic MRI 08/27/21 showed: staging at T4b; N2 likely associated with early extra mesorectal lymph node involvement; distance from tumor to internal anal sphincter is 1.2 cm; also with extramural venous involvement. He has biopsy proven lung mets -baseline CEA 571.84 on 09/06/21. -he received concurrent chemoRT with Xeloda 09/06/21 - 09/26/21, followed by first-line CAPOX and bevacizumab on 10/14/21. Xeloda dose reduced due to elevated liver enzymes. -restaging CT CAP on 01/23/22 showed: decrease in size of pulmonary nodules; approximately 10 new liver lesions. Abdomen MRI on 02/10/22 confirmed new liver lesions. -we switched to second-line FOLFIRI, with continued beva, on 02/21/22, with dose-reduced irinotecan for cycle 1. He tolerated well overall. -restaging CT CAP 05/15/22 showed decreased lung metastasis, stable liver lesions, no other new or progressive disease.  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   2. Symptom Management: HTN, Weight loss, Neuropathy -elevated BP secondary to beva; we started him  on low-dose amlodipine on 03/02/22. He is compliant and endorses taking daily. 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 Pain is mainly on bottom of his feet, some in his fingertips. I prescribed gabapentin 05/16/22. I advised him to increase to 239m BID; I called in for him on 05/28/22. -he reports using oxycodone for his neuropathy related pain.  I again discouraged him to use narcotics for that, and will limit his oxycodone use.  I have referred him to palliative care due to his presentation, he is scheduled to see NLexine [name] 2 days.   3. Multiple colon polyps -He has a large 3.5 cm polyps in the hepatic flexure, biopsy showed tubular adenoma. He is scheduled for f/u with Dr. MRush Landmarkon 12/13/21.  They discussed polypectomy/resection to remove it -In the context of his stage IV rectal cancer, he prefers to defer resection for now, which we agree -He had additional 4 small polyps which were removed, path showed tubular adenomas.     PLAN: -proceed with C8 FOLFIRI with same dose and bevacizumab today  -Fulphila injection with pump d/c on day 3 -lab, flush, f/u, and FOLFIRI/Beva 8/9 and 8/22 -Consult with palliative care NP Jorge [name] in 2 days for pain management, especially narcotics use.   No problem-specific Assessment & Plan notes found for this encounter.   SUMMARY OF ONCOLOGIC HISTORY: Oncology History Overview Note  Cancer Staging Rectal cancer ([facility] Staging form: Colon and Rectum, AJCC 8th Edition - Clinical stage from 08/29/2021: cKirtland Bouchard cM1 - Signed by FTruitt Merle MD on 08/29/2021    Rectal cancer ([facility]  07/23/2021 Imaging   CT AP  IMPRESSION: 1. Appearance of the rectum likely indicates a rectal mass suspicious for rectal carcinoma. Inflammatory process would be  less likely. Direct visualization is suggested. 2. Cholelithiasis without evidence of acute cholecystitis. 3. Aortic atherosclerosis.   08/19/2021 Procedure    Colonoscopy, under Dr. [name]  Impression: - Rectal tenderness, palpable rectal mass and hemorrhoids found on digital rectal exam. - Stool in the entire examined colon - lavaged with still inadequate clearance. - One 35 mm polyp at the hepatic flexure. Biopsied. Tattooed distal in case future endoscopic resection is considered - however, has larger issues with rectal mass currently as below. - Four, 5 to 11 mm polyps in the transverse colon and at the hepatic flexure, removed with a cold snare. Resected and retrieved. - Diverticulosis in the recto-sigmoid colon and in the sigmoid colon. - Rule out malignancy, partially obstructing tumor in the rectum and from 6 to 13 cm proximal to the anus. Biopsied.   08/19/2021 Pathology Results   Diagnosis 1. Hepatic Flexure Biopsy - TUBULAR ADENOMA WITHOUT HIGH-GRADE DYSPLASIA OR MALIGNANCY 2. Transverse Colon Biopsy, and hepatic flexure, polyps (4) - TUBULAR ADENOMA WITHOUT HIGH-GRADE DYSPLASIA OR MALIGNANCY - OTHER FRAGMENTS OF POLYPOID COLONIC MUCOSA WITH NO SPECIFIC HISTOPATHOLOGIC CHANGES - FOOD MATERIAL 3. Rectum, biopsy - ADENOCARCINOMA. SEE NOTE   08/22/2021 Initial Diagnosis   Rectal cancer ([place])   08/26/2021 Imaging   IMPRESSION: 1. A 2.2 x 1.9 cm right middle lobe pulmonary nodule as well as a total of four right upper lobe pulmonary nodule and masses measuring up to 3.6 cm. Findings concerning for metastatic primary lung cancer versus less likely metastases in a patient with rectal cancer. Additional imaging evaluation or consultation with Pulmonology or Thoracic Surgery recommended. 2. No gross hilar adenopathy, noting limited sensitivity for the detection of hilar adenopathy on this noncontrast study. 3. Cholelithiasis. 4.  Emphysema (ICD10-J43.9). 5. At least left anterior descending coronary artery calcifications.   08/27/2021 Imaging   IMPRESSION: Rectal adenocarcinoma T stage: T4 B   Rectal adenocarcinoma N stage: N2  disease likely associated with early extra mesorectal lymph node involvement.   Distance from tumor to the internal anal sphincter is 1.2 cm.   Also with extramural venous involvement as described.   08/29/2021 Cancer Staging   Staging form: Colon and Rectum, AJCC 8th Edition - Clinical stage from 08/29/2021: [name], cM1 - Signed by [name], MD on 08/29/2021 Stage prefix: Initial diagnosis   01/23/2022 Imaging   CT CAP w contrast IMPRESSION: 1. Mild interval decrease in size of multiple right-sided pulmonary nodules. No new suspicious pulmonary nodule or mass. 2. Interval development of approximately 10 new ill-defined hypoattenuating liver lesions ranging in size from approximately 5-10 mm. Imaging features suspicious for metastatic disease. MRI abdomen with and without contrast may prove helpful to further evaluate. 3. Similar appearance of wall thickening in the rectum with perirectal edema. 4. Cholelithiasis. 5. Prostatomegaly. 6. Aortic Atherosclerosis (ICD10-I70.0).   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[place])  10/06/2021 Initial Diagnosis   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[place])   10/14/2021 - 01/25/2022 Chemotherapy   Patient is on Treatment Plan : COLORECTAL CapeOx + Bevacizumab q21d     02/21/2022 -  Chemotherapy   Patient is on Treatment Plan : COLORECTAL FOLFIRI / BEVACIZUMAB Q14D        INTERVAL HISTORY:  Jorge [name] is here for a follow up of metastatic rectal cancer. He was last seen by me on 05/16/22. He presents to the clinic alone. He reports numbness to his fingers and bottoms of his feet; he notes his feet are worse than his fingers and are  painful. He endorses taking 153m gabapentin at bedtime. He reports using oxycodone for the pain from neuropathy. He tells me the pharmacy only gave him 68 pills instead of 90 on his last refill. He was already home when he counted these, so I advised him to count the pills while in the pharmacy. We again  discussed the fact that oxycodone is a narcotic and a controlled substance. I again advised him to only use sparingly, which he verbally agrees to doing.   All other systems were reviewed with the patient and are negative.  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 ([facility] 08/19/2021    SURGICAL HISTORY: Past Surgical History:  Procedure Laterality Date   BRONCHIAL BIOPSY  09/27/2021   Procedure: BRONCHIAL BIOPSIES;  Surgeon: IGarner Nash DO;  [facility];  Service: Pulmonary;;   BRONCHIAL BRUSHINGS  09/27/2021   Procedure: BRONCHIAL BRUSHINGS;  Surgeon: IGarner Nash DO;  [facility];  Service: Pulmonary;;   BRONCHIAL NEEDLE ASPIRATION BIOPSY  09/27/2021   Procedure: BRONCHIAL NEEDLE ASPIRATION BIOPSIES;  Surgeon: IGarner Nash DO;  [facility]  Service: Pulmonary;;   COLONOSCOPY     IR IMAGING GUIDED PORT INSERTION  10/24/2021   left breast surgery Left 1968   VIDEO BRONCHOSCOPY WITH RADIAL ENDOBRONCHIAL ULTRASOUND  09/27/2021   Procedure: RADIAL ENDOBRONCHIAL ULTRASOUND;  Surgeon: IGarner Nash DO;  [facility];  Service: Pulmonary;;    I have reviewed the social history and family history with the patient and they are unchanged from previous note.  ALLERGIES:  has No Known Allergies.  MEDICATIONS:  Current Outpatient Medications  Medication Sig Dispense Refill   amLODipine (NORVASC) 5 MG tablet TAKE 1 TABLET (5 MG TOTAL) BY MOUTH DAILY. 30 tablet 0   carbonyl iron (FEOSOL) 45 MG TABS tablet Take 45 mg by mouth daily.     gabapentin (NEURONTIN) 100 MG capsule Take 2 capsules (200 mg total) by mouth 2 (two) times daily. 120 capsule 0   Multiple Vitamin (MULTIVITAMIN WITH MINERALS) TABS tablet Take 1 tablet by mouth daily.     ondansetron (ZOFRAN) 8 MG tablet Take 1 tablet (8 mg total) by mouth every 8 (eight) hours as needed for  nausea or vomiting. 30 tablet 0   oxyCODONE (OXY IR/ROXICODONE) 5 MG immediate release tablet Take 1 tablet (5 mg total) by mouth every 6 (six) hours as needed for severe pain. 15 tablet 0   No current facility-administered medications for this visit.   Facility-Administered Medications Ordered in Other Visits  Medication Dose Route Frequency Provider Last Rate Last Admin   fluorouracil (ADRUCIL) 4,450 mg in sodium chloride 0.9 % 61 mL chemo infusion  2,400 mg/m2 (Treatment Plan Recorded) Intravenous 1 day or 1 dose FTruitt Merle MD   4,450 mg at 05/30/22 1541   heparin lock flush 100 unit/mL  500 Units Intracatheter Once PRN FTruitt Merle MD       sodium chloride flush (NS) 0.9 % injection 10 mL  10 mL Intracatheter PRN FTruitt Merle MD        PHYSICAL EXAMINATION: ECOG PERFORMANCE STATUS: 1 - Symptomatic but completely ambulatory  Vitals:   05/30/22 1156  BP: (!) 169/87  Pulse: 62  Resp: 18  Temp: 98.4 F (36.9 C)  SpO2: 99%   Wt Readings from Last 3 Encounters:  05/30/22 154 lb 8 oz (70.1 kg)  05/16/22 150 lb 3.2  oz (68.1 kg)  05/03/22 147 lb 4.8 oz (66.8 kg)     GENERAL:alert, no distress and comfortable SKIN: skin color normal, no rashes or significant lesions EYES: normal, Conjunctiva are pink and non-injected, sclera clear  NEURO: alert & oriented x 3 with fluent speech  LABORATORY DATA:  I have reviewed the data as listed    Latest Ref Rng & Units 05/30/2022   11:22 AM 05/16/2022   10:19 AM 05/03/2022    8:48 AM  CBC  WBC 4.0 - 10.5 K/uL 7.7  7.1  6.8   Hemoglobin 13.0 - 17.0 g/dL 13.5  13.4  14.1   Hematocrit 39.0 - 52.0 % 40.7  40.3  42.5   Platelets 150 - 400 K/uL 149  145  165         Latest Ref Rng & Units 05/30/2022   11:22 AM 05/16/2022   10:19 AM 05/03/2022    8:48 AM  CMP  Glucose 70 - 99 mg/dL 98  122  196   BUN 8 - 23 mg/dL '9  10  12   ' Creatinine 0.61 - 1.24 mg/dL 1.09  1.05  0.95   Sodium 135 - 145 mmol/L 139  136  136   Potassium 3.5 - 5.1 mmol/L 4.5   4.1  4.1   Chloride 98 - 111 mmol/L 106  104  104   CO2 22 - 32 mmol/L '30  29  29   ' Calcium 8.9 - 10.3 mg/dL 8.7  8.8  9.0   Total Protein 6.5 - 8.1 g/dL 6.5  6.5  6.5   Total Bilirubin 0.3 - 1.2 mg/dL 0.3  0.3  0.3   Alkaline Phos 38 - 126 U/L 179  153  147   AST 15 - 41 U/L '20  18  19   ' ALT 0 - 44 U/L '15  13  14       ' RADIOGRAPHIC STUDIES: I have personally reviewed the radiological images as listed and agreed with the findings in the report. No results found.    No orders of the defined types were placed in this encounter.  All questions were answered. The patient knows to call the clinic with any problems, questions or concerns. No barriers to learning was detected. The total time spent in the appointment was 30 minutes.     [name], MD 05/30/2022   I, Wilburn Mylar, am acting as scribe for [name], MD.   I have reviewed the above documentation for accuracy and completeness, and I agree with the above.

## 2022-06-01 ENCOUNTER — Other Ambulatory Visit: Payer: Self-pay

## 2022-06-01 ENCOUNTER — Inpatient Hospital Stay: Payer: Medicare Other

## 2022-06-01 ENCOUNTER — Encounter: Payer: Self-pay | Admitting: Nurse Practitioner

## 2022-06-01 ENCOUNTER — Inpatient Hospital Stay (HOSPITAL_BASED_OUTPATIENT_CLINIC_OR_DEPARTMENT_OTHER): Payer: Medicare Other | Admitting: Nurse Practitioner

## 2022-06-01 VITALS — BP 142/86 | HR 75 | Temp 98.6°F | Resp 15 | Wt 153.5 lb

## 2022-06-01 VITALS — BP 151/81 | HR 76 | Temp 98.6°F | Resp 16

## 2022-06-01 DIAGNOSIS — C20 Malignant neoplasm of rectum: Secondary | ICD-10-CM | POA: Diagnosis not present

## 2022-06-01 DIAGNOSIS — C7800 Secondary malignant neoplasm of unspecified lung: Secondary | ICD-10-CM

## 2022-06-01 DIAGNOSIS — Z5112 Encounter for antineoplastic immunotherapy: Secondary | ICD-10-CM | POA: Diagnosis not present

## 2022-06-01 DIAGNOSIS — Z7189 Other specified counseling: Secondary | ICD-10-CM

## 2022-06-01 DIAGNOSIS — M792 Neuralgia and neuritis, unspecified: Secondary | ICD-10-CM

## 2022-06-01 DIAGNOSIS — Z515 Encounter for palliative care: Secondary | ICD-10-CM | POA: Diagnosis not present

## 2022-06-01 MED ORDER — PEGFILGRASTIM-JMDB 6 MG/0.6ML ~~LOC~~ SOSY
6.0000 mg | PREFILLED_SYRINGE | Freq: Once | SUBCUTANEOUS | Status: AC
  Administered 2022-06-01: 6 mg via SUBCUTANEOUS
  Filled 2022-06-01: qty 0.6

## 2022-06-01 MED ORDER — SODIUM CHLORIDE 0.9% FLUSH
10.0000 mL | INTRAVENOUS | Status: DC | PRN
  Administered 2022-06-01: 10 mL

## 2022-06-01 MED ORDER — HEPARIN SOD (PORK) LOCK FLUSH 100 UNIT/ML IV SOLN
500.0000 [IU] | Freq: Once | INTRAVENOUS | Status: AC | PRN
  Administered 2022-06-01: 500 [IU]

## 2022-06-01 MED ORDER — DULOXETINE HCL 20 MG PO CPEP: 20.0000 mg | ORAL_CAPSULE | Freq: Every day | ORAL | 0 refills | Status: DC

## 2022-06-01 NOTE — Progress Notes (Signed)
[place]  Telephone:(336) [phone] Fax:(336) [phone]   Name: Jorge [name] Date: 06/01/2022 MRN: [id number]  DOB: 1951-03-17  Patient Care Team: Pcp, No as PCP - General    REASON FOR CONSULTATION: Jorge [name] is a 71 y.o. 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  Palliative ask to see for symptom management and goals of care.    SOCIAL HISTORY:     reports that he has been smoking cigarettes. He has a 5.00 pack-year smoking history. He has never used smokeless tobacco. He reports that he does not currently use alcohol. He reports that he does not use drugs.  ADVANCE DIRECTIVES:    CODE STATUS:   PAST MEDICAL HISTORY: 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 ([place]) 08/19/2021    PAST SURGICAL HISTORY:  Past Surgical History:  Procedure Laterality Date   BRONCHIAL BIOPSY  09/27/2021   Procedure: BRONCHIAL BIOPSIES;  Surgeon: Jorge Nash, DO;  Location: [facility];  Service: Pulmonary;;   BRONCHIAL BRUSHINGS  09/27/2021   Procedure: BRONCHIAL BRUSHINGS;  Surgeon: Jorge Nash, DO;  Location: [place];  Service: Pulmonary;;   BRONCHIAL NEEDLE ASPIRATION BIOPSY  09/27/2021   Procedure: BRONCHIAL NEEDLE ASPIRATION BIOPSIES;  Surgeon: Jorge Nash, DO;  Location: [place];  Service: Pulmonary;;   COLONOSCOPY     IR IMAGING GUIDED PORT INSERTION  10/24/2021   left breast surgery Left 1968   VIDEO BRONCHOSCOPY WITH RADIAL ENDOBRONCHIAL ULTRASOUND  09/27/2021   Procedure: RADIAL ENDOBRONCHIAL ULTRASOUND;  Surgeon: Jorge Nash, DO;  Location: [facility];  Service: Pulmonary;;    HEMATOLOGY/ONCOLOGY HISTORY:  Oncology History Overview Note  Cancer Staging Rectal cancer [facility]) Staging form: Colon and Rectum, AJCC 8th Edition - Clinical stage from  08/29/2021: Jorge [name], cM1 - Signed by Jorge Merle, MD on 08/29/2021    Rectal cancer ([place])  07/23/2021 Imaging   CT AP  IMPRESSION: 1. Appearance of the rectum likely indicates a rectal mass suspicious for rectal carcinoma. Inflammatory process would be less likely. Direct visualization is suggested. 2. Cholelithiasis without evidence of acute cholecystitis. 3. Aortic atherosclerosis.   08/19/2021 Procedure   Colonoscopy, under Dr. Rush [name]  Impression: - Rectal tenderness, palpable rectal mass and hemorrhoids found on digital rectal exam. - Stool in the entire examined colon - lavaged with still inadequate clearance. - One 35 mm polyp at the hepatic flexure. Biopsied. Tattooed distal in case future endoscopic resection is considered - however, has larger issues with rectal mass currently as below. - Four, 5 to 11 mm polyps in the transverse colon and at the hepatic flexure, removed with a cold snare. Resected and retrieved. - Diverticulosis in the recto-sigmoid colon and in the sigmoid colon. - Rule out malignancy, partially obstructing tumor in the rectum and from 6 to 13 cm proximal to the anus. Biopsied.   08/19/2021 Pathology Results   Diagnosis 1. Hepatic Flexure Biopsy - TUBULAR ADENOMA WITHOUT HIGH-GRADE DYSPLASIA OR MALIGNANCY 2. Transverse Colon Biopsy, and hepatic flexure, polyps (4) - TUBULAR ADENOMA WITHOUT HIGH-GRADE DYSPLASIA OR MALIGNANCY - OTHER FRAGMENTS OF POLYPOID COLONIC MUCOSA WITH NO SPECIFIC HISTOPATHOLOGIC CHANGES - FOOD MATERIAL 3. Rectum, biopsy - ADENOCARCINOMA. SEE NOTE   08/22/2021 Initial Diagnosis   Rectal cancer ([place])   08/26/2021 Imaging   IMPRESSION: 1. A 2.2 x 1.9 cm right middle lobe pulmonary nodule as  well as a total of four right upper lobe pulmonary nodule and masses measuring up to 3.6 cm. Findings concerning for metastatic primary lung cancer versus less likely metastases in a patient with rectal cancer. Additional imaging evaluation  or consultation with Pulmonology or Thoracic Surgery recommended. 2. No gross hilar adenopathy, noting limited sensitivity for the detection of hilar adenopathy on this noncontrast study. 3. Cholelithiasis. 4.  Emphysema (ICD10-J43.9). 5. At least left anterior descending coronary artery calcifications.   08/27/2021 Imaging   IMPRESSION: Rectal adenocarcinoma T stage: T4 B   Rectal adenocarcinoma N stage: N2 disease likely associated with early extra mesorectal lymph node involvement.   Distance from tumor to the internal anal sphincter is 1.2 cm.   Also with extramural venous involvement as described.   08/29/2021 Cancer Staging   Staging form: Colon and Rectum, AJCC 8th Edition - Clinical stage from 08/29/2021: Jorge [name], cM1 - Signed by Jorge Merle, MD on 08/29/2021 Stage prefix: Initial diagnosis   01/23/2022 Imaging   CT CAP w contrast IMPRESSION: 1. Mild interval decrease in size of multiple right-sided pulmonary nodules. No new suspicious pulmonary nodule or mass. 2. Interval development of approximately 10 new ill-defined hypoattenuating liver lesions ranging in size from approximately 5-10 mm. Imaging features suspicious for metastatic disease. MRI abdomen with and without contrast may prove helpful to further evaluate. 3. Similar appearance of wall thickening in the rectum with perirectal edema. 4. Cholelithiasis. 5. Prostatomegaly. 6. Aortic Atherosclerosis (ICD10-I70.0).   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[place])  10/06/2021 Initial Diagnosis   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 [facility])   10/14/2021 - 01/25/2022 Chemotherapy   Patient is on Treatment Plan : COLORECTAL CapeOx + Bevacizumab q21d     02/21/2022 -  Chemotherapy   Patient is on Treatment Plan : COLORECTAL FOLFIRI / BEVACIZUMAB Q14D       ALLERGIES:  has No Known Allergies.  MEDICATIONS:  Current Outpatient Medications  Medication Sig Dispense Refill   DULoxetine (CYMBALTA) 20 MG capsule  Take 1 capsule (20 mg total) by mouth daily. 30 capsule 0   amLODipine (NORVASC) 5 MG tablet TAKE 1 TABLET (5 MG TOTAL) BY MOUTH DAILY. 30 tablet 0   carbonyl iron (FEOSOL) 45 MG TABS tablet Take 45 mg by mouth daily.     gabapentin (NEURONTIN) 100 MG capsule Take 2 capsules (200 mg total) by mouth 2 (two) times daily. 120 capsule 0   Multiple Vitamin (MULTIVITAMIN WITH MINERALS) TABS tablet Take 1 tablet by mouth daily.     ondansetron (ZOFRAN) 8 MG tablet Take 1 tablet (8 mg total) by mouth every 8 (eight) hours as needed for nausea or vomiting. 30 tablet 0   oxyCODONE (OXY IR/ROXICODONE) 5 MG immediate release tablet Take 1 tablet (5 mg total) by mouth every 6 (six) hours as needed for severe pain. 15 tablet 0   No current facility-administered medications for this visit.    VITAL SIGNS: BP (!) 142/86 (BP Location: Left Arm, Patient Position: Sitting) Comment: Nurse notified  Pulse 75   Temp 98.6 F (37 C) (Oral)   Resp 15   Wt 153 lb 8 oz (69.6 kg)   SpO2 95%   BMI 20.82 kg/m  Filed Weights   06/01/22 1409  Weight: 153 lb 8 oz (69.6 kg)    Estimated body mass index is 20.82 kg/m as calculated from the following:   Height as of 05/30/22: 6' (1.829 m).   Weight as of this encounter: 153 lb 8 oz (69.6 kg).  LABS: CBC:    Component Value Date/Time   WBC 7.7 05/30/2022 1122   HGB 13.5 05/30/2022 1122   HGB 14.4 04/20/2022 0805   HCT 40.7 05/30/2022 1122   PLT 149 (L) 05/30/2022 1122   PLT 134 (L) 04/20/2022 0805   MCV 92.9 05/30/2022 1122   NEUTROABS 5.4 05/30/2022 1122   LYMPHSABS 0.9 05/30/2022 1122   MONOABS 1.0 05/30/2022 1122   EOSABS 0.3 05/30/2022 1122   BASOSABS 0.1 05/30/2022 1122   Comprehensive Metabolic Panel:    Component Value Date/Time   NA 139 05/30/2022 1122   K 4.5 05/30/2022 1122   CL 106 05/30/2022 1122   CO2 30 05/30/2022 1122   BUN 9 05/30/2022 1122   CREATININE 1.09 05/30/2022 1122   CREATININE 0.89 04/20/2022 0805   GLUCOSE 98 05/30/2022  1122   CALCIUM 8.7 (L) 05/30/2022 1122   AST 20 05/30/2022 1122   AST 19 04/20/2022 0805   ALT 15 05/30/2022 1122   ALT 15 04/20/2022 0805   ALKPHOS 179 (H) 05/30/2022 1122   BILITOT 0.3 05/30/2022 1122   BILITOT 0.3 04/20/2022 0805   PROT 6.5 05/30/2022 1122   ALBUMIN 3.5 05/30/2022 1122    RADIOGRAPHIC STUDIES: CT CHEST ABDOMEN PELVIS W CONTRAST  Result Date: 05/15/2022 CLINICAL DATA:  Follow-up metastatic rectal carcinoma. Evaluate treatment response. * Tracking Code: BO * EXAM: CT CHEST, ABDOMEN, AND PELVIS WITH CONTRAST TECHNIQUE: Multidetector CT imaging of the chest, abdomen and pelvis was performed following the standard protocol during bolus administration of intravenous contrast. RADIATION DOSE REDUCTION: This exam was performed according to the departmental dose-optimization program which includes automated exposure control, adjustment of the mA and/or kV according to patient size and/or use of iterative reconstruction technique. CONTRAST:  176m OMNIPAQUE IOHEXOL 300 MG/ML  SOLN COMPARISON:  01/23/2022 FINDINGS: CT CHEST FINDINGS Cardiovascular: No acute findings. Mediastinum/Lymph Nodes: No masses or pathologically enlarged lymph nodes identified. Lungs/Pleura: Several macrolobulated and spiculated nodules are again seen in the right upper and middle lobes. The largest index nodule in the inferior right upper lobe currently measures 2.0 x 1.7 cm on image 79/4, compared to 2.3 x 1.8 cm previously. A few scattered sub-cm pulmonary nodules are noted bilaterally, which show no significant change. No new or enlarging pulmonary nodules or masses identified. Musculoskeletal:  No suspicious bone lesions identified. CT ABDOMEN AND PELVIS FINDINGS Hepatobiliary: Multiple small hypovascular liver lesions in both the right and left hepatic lobes show no significant change. Dominant lesion in the posterolateral dome of the right lobe measures 10 mm, without significant change. No new or enlarging  liver lesions identified. Numerous tiny sub-cm calcified gallstones are again seen, however there is no evidence of cholecystitis or biliary ductal dilatation. Pancreas:  No mass or inflammatory changes. Spleen:  Within normal limits in size and appearance. Adrenals/Urinary tract:  No masses or hydronephrosis. Stomach/Bowel: Concentric wall thickening of the lower rectum is again seen as well as adjacent presacral soft tissue stranding, without significant change. No evidence of obstruction, inflammatory process, or abnormal fluid collections. Vascular/Lymphatic: No pathologically enlarged lymph nodes identified. No acute vascular findings. Aortic atherosclerotic calcification incidentally noted. Reproductive:  No mass or other significant abnormality identified. Other:  None. Musculoskeletal:  No suspicious bone lesions identified. IMPRESSION: Slight decrease in size of right lung metastases. Stable small hypovascular liver lesions, highly suspicious for metastatic disease. Stable concentric wall thickening of lower rectum and adjacent presacral soft tissue stranding. No evidence of new or progressive disease. Cholelithiasis. No radiographic evidence of  cholecystitis. Aortic Atherosclerosis (ICD10-I70.0). Electronically Signed   By: Marlaine Hind M.D.   On: 05/15/2022 11:50    PERFORMANCE STATUS (ECOG) : 1 - Symptomatic but completely ambulatory  Review of Systems  Constitutional:  Positive for fatigue.  Neurological:  Positive for numbness.  Unless otherwise noted, a complete review of systems is negative.  Physical Exam General: NAD, ambulatory  Cardiovascular: regular rate and rhythm Pulmonary: clear ant fields, normal breathing patten Abdomen: soft, nontender, + bowel sounds Extremities: no edema, no joint deformities Skin: no rashes Neurological: Weakness but otherwise nonfocal  IMPRESSION: This is my initial visit with Mr. Saxton. He presents to the clinic by himself. Ambulatory without  assistive devices. Complains of pain in his feet when standing or walking for long periods of time. No acute distress.   I introduced myself, Maygan RN, and Palliative's role in collaboration with the oncology team. Concept of Palliative Care was introduced as specialized medical care for people and their families living with serious illness.  It focuses on providing relief from the symptoms and stress of a serious illness.  The goal is to improve quality of life for both the patient and the family. Values and goals of care important to patient and family were attempted to be elicited.    Mr. Higinbotham has lived with his daughter, [name] for the past 5 years. His wife passed away 10 years ago they were married for more than 40 years. He has 5 children and 29 grandchildren.  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  Enjoys sports and being outside.  In the home Mr. Sitter is able to perform all ADLs independently.  Does require some rest breaks due to ongoing fatigue.  Does not use any assistive devices.  Does endorse some difficulty ambulating at times due to neuropathic pain and ankle swelling.  Appetite is great.  Denies nausea/vomiting or constipation.  He is quite active and reports he does a lot of walking.  He walks around outside throughout the day including to the grocery store and the bus stop.  1. Neuropathic pain Donaldson endorses ongoing hands/feet burning and tingling.  He states when he gets up to walk he must stand for a few minutes and gain his bearings to allow his feet to catch it with his body and also be able to prepare himself for the discomfort.  He does have some difficulty with opening items due to discomfort in his hands.  He reports there is not anything that makes the numbness and tingling better other than medication.  Ambulating long distances, standing for long periods of time, and  utilizing his hands for certain activities escalates his discomfort.  Education provided on neuropathic pain and ways to effectively manage with a goal of hopefully improving symptoms.  He understands symptoms may not resolve but if we are able to decrease his discomfort this will be ideal for his quality of life.  We reviewed at length his current regimen: Gabapentin 200 mg twice daily and Oxy IR 5 mg every 6 hours as needed.  We discussed the use of both medications with understanding that for his neuropathic pain gabapentin is probably most effective.  He verbalized understanding and has had similar conversations with [name]. He reports he only uses his Oxy IR when pain is severe.  States he averages 1-3 tablets/day.  Most days he is taking 2 tablets.  He confirms  he is taking gabapentin as prescribed.  He does notice some drowsiness at times however is able to remain active throughout the day.  Education provided on use of Cymbalta in addition to his gabapentin for ongoing neuropathic pain support.  He understands the goal will be to hopefully discontinue use of Oxy IR and allow these 2 medications to manage his symptoms.  He verbalized understanding and appreciation.  2. Goals of care   We discussed his current illness and what it means in the larger context of Her on-going co-morbidities. Natural disease trajectory and expectations were discussed.  Xzavian verbalizes understanding of his current illness and comorbidities.  He is remaining hopeful for stability and continued treatment allowing him every opportunity to continue to thrive and spend time with his grandchildren.  If his health was to significantly decline he would not wish to be in a suffering state.  I discussed the importance of continued conversation with family and their medical providers regarding overall plan of care and treatment options, ensuring decisions are within the context of the patients values and  GOCs.  PLAN: Establish therapeutic relationship with patient.  Education provided on palliative's role in collaboration with his oncology team focusing on his symptom management.   Gabapentin 200 mg twice daily.  We will continue to monitor with some room to adjust as needed.   Cymbalta 20 mg daily.  Education provided on starting new medication along with goal of treatment and possible side effects.   Oxy IR 5 mg every 8 hours as needed for breakthrough pain.  He understands the goal would be to eventually discontinue use with hopes of gaining better control of his neuropathic pain.   Ongoing goals of care discussions and support  I will plan to see patient back in 2-4 weeks in collaboration to other oncology appointments.  I will plan to contact patient on next week to see how he is tolerating his Cymbalta.  He confirms requesting to reach out to his daughter as he sometimes may not be able to answer his phone.   Patient expressed understanding and was in agreement with this plan. He also understands that He can call the clinic at any time with any questions, concerns, or complaints.   Thank you for your referral and allowing Palliative to assist in Mr. Tymere Depuy [facility] care.   Number and complexity of problems addressed: 2 HIGH - 1 or more chronic illnesses with SEVERE exacerbation, progression, or side effects of treatment - advanced cancer, pain. Any controlled substances utilized were prescribed in the context of palliative care.  Time Total: 55 min   Visit consisted of counseling and education dealing with the complex and emotionally intense issues of symptom management and palliative care in the setting of serious and potentially life-threatening illness.Greater than 50%  of this time was spent counseling and coordinating care related to the above assessment and plan.  Signed by: [name], AGPCNP-BC Palliative Medicine Team/Peter Drummond

## 2022-06-02 ENCOUNTER — Encounter: Payer: Self-pay | Admitting: Hematology

## 2022-06-02 ENCOUNTER — Other Ambulatory Visit: Payer: Self-pay | Admitting: Nurse Practitioner

## 2022-06-02 DIAGNOSIS — Z515 Encounter for palliative care: Secondary | ICD-10-CM

## 2022-06-02 DIAGNOSIS — G893 Neoplasm related pain (acute) (chronic): Secondary | ICD-10-CM

## 2022-06-02 DIAGNOSIS — C7800 Secondary malignant neoplasm of unspecified lung: Secondary | ICD-10-CM

## 2022-06-02 MED ORDER — OXYCODONE HCL 5 MG PO TABS: 5.0000 mg | ORAL_TABLET | Freq: Three times a day (TID) | ORAL | 0 refills | Status: DC | PRN

## 2022-06-06 ENCOUNTER — Inpatient Hospital Stay: Payer: Medicare Other | Attending: Physician Assistant | Admitting: Nurse Practitioner

## 2022-06-06 ENCOUNTER — Encounter: Payer: Self-pay | Admitting: Nurse Practitioner

## 2022-06-06 DIAGNOSIS — C7800 Secondary malignant neoplasm of unspecified lung: Secondary | ICD-10-CM | POA: Insufficient documentation

## 2022-06-06 DIAGNOSIS — Z5189 Encounter for other specified aftercare: Secondary | ICD-10-CM | POA: Insufficient documentation

## 2022-06-06 DIAGNOSIS — Z5111 Encounter for antineoplastic chemotherapy: Secondary | ICD-10-CM | POA: Diagnosis present

## 2022-06-06 DIAGNOSIS — G893 Neoplasm related pain (acute) (chronic): Secondary | ICD-10-CM

## 2022-06-06 DIAGNOSIS — C20 Malignant neoplasm of rectum: Secondary | ICD-10-CM | POA: Diagnosis present

## 2022-06-06 DIAGNOSIS — Z5112 Encounter for antineoplastic immunotherapy: Secondary | ICD-10-CM | POA: Insufficient documentation

## 2022-06-06 DIAGNOSIS — C775 Secondary and unspecified malignant neoplasm of intrapelvic lymph nodes: Secondary | ICD-10-CM | POA: Insufficient documentation

## 2022-06-06 DIAGNOSIS — Z79899 Other long term (current) drug therapy: Secondary | ICD-10-CM | POA: Insufficient documentation

## 2022-06-06 DIAGNOSIS — Z515 Encounter for palliative care: Secondary | ICD-10-CM | POA: Diagnosis not present

## 2022-06-06 DIAGNOSIS — M792 Neuralgia and neuritis, unspecified: Secondary | ICD-10-CM

## 2022-06-06 NOTE — Progress Notes (Signed)
[place]  Telephone:([phone]) [phone] Fax:(336) [phone]   Name: Jorge [name] Date: 06/06/2022 MRN: [id number]  DOB: 12-20-1950  Patient Care Team: Pcp, No as PCP - General Pickenpack-Cousar, [name], NP as Nurse Practitioner (Nurse Practitioner)   I connected with Jorge [name] on 06/06/22 at  1:30 PM EDT by phone and verified that I am speaking with the correct person using two identifiers.   I discussed the limitations, risks, security and privacy concerns of performing an evaluation and management service by telemedicine and the availability of in-person appointments. I also discussed with the patient that there may be a patient responsible charge related to this service. The patient expressed understanding and agreed to proceed.   Other persons participating in the visit and their role in the encounter: Jorge [name], daughter and Maygan, RN    Patient's location: Home   Provider's location: [place]    INTERVAL HISTORY: Jorge [name] is a 71 y.o. 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  Palliative ask to see for symptom management and goals of care.   SOCIAL HISTORY:     reports that he has been smoking cigarettes. He has a 5.00 pack-year smoking history. He has never used smokeless tobacco. He reports that he does not currently use alcohol. He reports that he does not use drugs.  ADVANCE DIRECTIVES:    CODE STATUS:   PAST MEDICAL HISTORY: 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 ([place]) 08/19/2021    ALLERGIES:  has No Known Allergies.  MEDICATIONS:  Current Outpatient Medications  Medication Sig Dispense Refill   amLODipine (NORVASC) 5 MG tablet TAKE 1 TABLET (5 MG TOTAL) BY MOUTH DAILY. 30 tablet 0   carbonyl iron (FEOSOL) 45 MG TABS tablet Take 45 mg by mouth daily.      DULoxetine (CYMBALTA) 20 MG capsule Take 1 capsule (20 mg total) by mouth daily. 30 capsule 0   gabapentin (NEURONTIN) 100 MG capsule Take 2 capsules (200 mg total) by mouth 2 (two) times daily. 120 capsule 0   Multiple Vitamin (MULTIVITAMIN WITH MINERALS) TABS tablet Take 1 tablet by mouth daily.     ondansetron (ZOFRAN) 8 MG tablet Take 1 tablet (8 mg total) by mouth every 8 (eight) hours as needed for nausea or vomiting. 30 tablet 0   oxyCODONE (OXY IR/ROXICODONE) 5 MG immediate release tablet Take 1 tablet (5 mg total) by mouth every 8 (eight) hours as needed for severe pain or breakthrough pain. 45 tablet 0   No current facility-administered medications for this visit.    VITAL SIGNS: There were no vitals taken for this visit. There were no vitals filed for this visit.  Estimated body mass index is 20.82 kg/m as calculated from the following:   Height as of 05/30/22: 6' (1.829 m).   Weight as of 06/01/22: 153 lb 8 oz (69.6 kg).   PERFORMANCE STATUS (ECOG) : 1 - Symptomatic but completely ambulatory   IMPRESSION: I connected with Jorge [name] by phone for symptom management follow-up. He reports no acute distress. Has his grandchildren home with him.   Neuropathic Pain  Reports his pain is somewhat better however is definitely still there. He likes to walk often and sometimes is unable to do so due to discomfort in his feet. Will get up from chair every few hours. Encouraged to continue  being active as much as possible. He had not started on the Cymbalta until yesterday. I re-emphasized medication use. He verbalized understanding and appreciation.    7/27: Jorge [name] endorses ongoing hands/feet burning and tingling.  He states when he gets up to walk he must stand for a few minutes and gain his bearings to allow his feet to catch it with his body and also be able to prepare himself for the discomfort.  He does have some difficulty with opening items due to discomfort in his hands.  He  reports there is not anything that makes the numbness and tingling better other than medication.  Ambulating long distances, standing for long periods of time, and utilizing his hands for certain activities escalates his discomfort.  Education provided on neuropathic pain and ways to effectively manage with a goal of hopefully improving symptoms.  He understands symptoms may not resolve but if we are able to decrease his discomfort this will be ideal for his quality of life.  We reviewed at length his current regimen: Gabapentin 200 mg twice daily and Oxy IR 5 mg every 6 hours as needed.  We discussed the use of both medications with understanding that for his neuropathic pain gabapentin is probably most effective.  He verbalized understanding and has had similar conversations with [name]. He reports he only uses his Oxy IR when pain is severe.  States he averages 1-3 tablets/day.  Most days he is taking 2 tablets.  He confirms he is taking gabapentin as prescribed.  He does notice some drowsiness at times however is able to remain active throughout the day.  Education provided on use of Cymbalta in addition to his gabapentin for ongoing neuropathic pain support.  He understands the goal will be to hopefully discontinue use of Oxy IR and allow these 2 medications to manage his symptoms.  He verbalized understanding and appreciation.  We discussed Her current illness and what it means in the larger context of Her on-going co-morbidities. Natural disease trajectory and expectations were discussed.  I discussed the importance of continued conversation with family and their medical providers regarding overall plan of care and treatment options, ensuring decisions are within the context of the patients values and GOCs.  PLAN:  Gabapentin '200mg'$  twice daily.  Cymbalta '20mg'$  daily.  Oxy IR '5mg'$  every 8 hours as needed I will plan to see patient back in 3-4 weeks in collaboration with his oncology  appointments.   Patient expressed understanding and was in agreement with this plan. He also understands that He can call the clinic at any time with any questions, concerns, or complaints.   Time Total: 20 min   Visit consisted of counseling and education dealing with the complex and emotionally intense issues of symptom management and palliative care in the setting of serious and potentially life-threatening illness.Greater than 50%  of this time was spent counseling and coordinating care related to the above assessment and plan.  Signed by: [name], AGPCNP-BC [place]

## 2022-06-08 ENCOUNTER — Other Ambulatory Visit: Payer: Self-pay | Admitting: Hematology

## 2022-06-13 MED FILL — Dexamethasone Sodium Phosphate Inj 100 MG/10ML: INTRAMUSCULAR | Qty: 1 | Status: AC

## 2022-06-14 ENCOUNTER — Inpatient Hospital Stay: Payer: Medicare Other

## 2022-06-14 ENCOUNTER — Other Ambulatory Visit: Payer: Self-pay

## 2022-06-14 ENCOUNTER — Encounter: Payer: Self-pay | Admitting: Hematology

## 2022-06-14 ENCOUNTER — Inpatient Hospital Stay (HOSPITAL_BASED_OUTPATIENT_CLINIC_OR_DEPARTMENT_OTHER): Payer: Medicare Other | Admitting: Hematology

## 2022-06-14 ENCOUNTER — Inpatient Hospital Stay (HOSPITAL_BASED_OUTPATIENT_CLINIC_OR_DEPARTMENT_OTHER): Payer: Medicare Other | Admitting: Nurse Practitioner

## 2022-06-14 VITALS — BP 162/90 | HR 60 | Temp 98.3°F | Wt 147.1 lb

## 2022-06-14 DIAGNOSIS — C20 Malignant neoplasm of rectum: Secondary | ICD-10-CM

## 2022-06-14 DIAGNOSIS — G893 Neoplasm related pain (acute) (chronic): Secondary | ICD-10-CM

## 2022-06-14 DIAGNOSIS — M792 Neuralgia and neuritis, unspecified: Secondary | ICD-10-CM | POA: Diagnosis not present

## 2022-06-14 DIAGNOSIS — Z95828 Presence of other vascular implants and grafts: Secondary | ICD-10-CM

## 2022-06-14 DIAGNOSIS — Z515 Encounter for palliative care: Secondary | ICD-10-CM | POA: Diagnosis not present

## 2022-06-14 DIAGNOSIS — C7800 Secondary malignant neoplasm of unspecified lung: Secondary | ICD-10-CM

## 2022-06-14 DIAGNOSIS — Z5112 Encounter for antineoplastic immunotherapy: Secondary | ICD-10-CM | POA: Diagnosis not present

## 2022-06-14 LAB — CBC WITH DIFFERENTIAL/PLATELET
Abs Immature Granulocytes: 0.03 10*3/uL (ref 0.00–0.07)
Basophils Absolute: 0 10*3/uL (ref 0.0–0.1)
Basophils Relative: 1 %
Eosinophils Absolute: 0.2 10*3/uL (ref 0.0–0.5)
Eosinophils Relative: 4 %
HCT: 44.6 % (ref 39.0–52.0)
Hemoglobin: 14.7 g/dL (ref 13.0–17.0)
Immature Granulocytes: 1 %
Lymphocytes Relative: 15 %
Lymphs Abs: 0.9 10*3/uL (ref 0.7–4.0)
MCH: 30.3 pg (ref 26.0–34.0)
MCHC: 33 g/dL (ref 30.0–36.0)
MCV: 92 fL (ref 80.0–100.0)
Monocytes Absolute: 0.9 10*3/uL (ref 0.1–1.0)
Monocytes Relative: 16 %
Neutro Abs: 3.8 10*3/uL (ref 1.7–7.7)
Neutrophils Relative %: 63 %
Platelets: 152 10*3/uL (ref 150–400)
RBC: 4.85 MIL/uL (ref 4.22–5.81)
RDW: 14.8 % (ref 11.5–15.5)
WBC: 5.9 10*3/uL (ref 4.0–10.5)
nRBC: 0 % (ref 0.0–0.2)

## 2022-06-14 LAB — COMPREHENSIVE METABOLIC PANEL
ALT: 16 U/L (ref 0–44)
AST: 22 U/L (ref 15–41)
Albumin: 3.9 g/dL (ref 3.5–5.0)
Alkaline Phosphatase: 146 U/L — ABNORMAL HIGH (ref 38–126)
Anion gap: 2 — ABNORMAL LOW (ref 5–15)
BUN: 9 mg/dL (ref 8–23)
CO2: 30 mmol/L (ref 22–32)
Calcium: 9 mg/dL (ref 8.9–10.3)
Chloride: 105 mmol/L (ref 98–111)
Creatinine, Ser: 0.83 mg/dL (ref 0.61–1.24)
GFR, Estimated: >60 mL/min (ref >60)
Glucose, Bld: 96 mg/dL (ref 70–99)
Potassium: 4 mmol/L (ref 3.5–5.1)
Sodium: 137 mmol/L (ref 135–145)
Total Bilirubin: 0.4 mg/dL (ref 0.3–1.2)
Total Protein: 7.2 g/dL (ref 6.5–8.1)

## 2022-06-14 LAB — CEA (IN HOUSE-CHCC): CEA (CHCC-In House): 17.19 ng/mL — ABNORMAL HIGH (ref 0.00–5.00)

## 2022-06-14 MED ORDER — SODIUM CHLORIDE 0.9 % IV SOLN
400.0000 mg/m2 | Freq: Once | INTRAVENOUS | Status: AC
  Administered 2022-06-14: 744 mg via INTRAVENOUS
  Filled 2022-06-14: qty 37.2

## 2022-06-14 MED ORDER — PALONOSETRON HCL INJECTION 0.25 MG/5ML
0.2500 mg | Freq: Once | INTRAVENOUS | Status: AC
  Administered 2022-06-14: 0.25 mg via INTRAVENOUS

## 2022-06-14 MED ORDER — SODIUM CHLORIDE 0.9 % IV SOLN
160.0000 mg/m2 | Freq: Once | INTRAVENOUS | Status: AC
  Administered 2022-06-14: 300 mg via INTRAVENOUS
  Filled 2022-06-14: qty 15

## 2022-06-14 MED ORDER — SODIUM CHLORIDE 0.9 % IV SOLN
2400.0000 mg/m2 | INTRAVENOUS | Status: DC
  Administered 2022-06-14: 4450 mg via INTRAVENOUS
  Filled 2022-06-14: qty 89

## 2022-06-14 MED ORDER — SODIUM CHLORIDE 0.9 % IV SOLN
5.0000 mg/kg | Freq: Once | INTRAVENOUS | Status: AC
  Administered 2022-06-14: 350 mg via INTRAVENOUS
  Filled 2022-06-14: qty 14

## 2022-06-14 MED ORDER — SODIUM CHLORIDE 0.9% FLUSH
10.0000 mL | Freq: Once | INTRAVENOUS | Status: AC
  Administered 2022-06-14: 10 mL

## 2022-06-14 MED ORDER — HEPARIN SOD (PORK) LOCK FLUSH 100 UNIT/ML IV SOLN: 500.0000 [IU] | Freq: Once | INTRAVENOUS | Status: DC | PRN

## 2022-06-14 MED ORDER — SODIUM CHLORIDE 0.9 % IV SOLN: Freq: Once | INTRAVENOUS | Status: AC

## 2022-06-14 MED ORDER — SODIUM CHLORIDE 0.9% FLUSH: 10.0000 mL | INTRAVENOUS | Status: DC | PRN

## 2022-06-14 MED ORDER — ATROPINE SULFATE 1 MG/ML IV SOLN: 0.5000 mg | Freq: Once | INTRAVENOUS | Status: DC | PRN

## 2022-06-14 MED ORDER — SODIUM CHLORIDE 0.9 % IV SOLN
10.0000 mg | Freq: Once | INTRAVENOUS | Status: AC
  Administered 2022-06-14: 10 mg via INTRAVENOUS
  Filled 2022-06-14: qty 10

## 2022-06-14 NOTE — Progress Notes (Signed)
[place]   Telephone:([phone] Fax:([phone]   Clinic Follow up Note   Patient Care Team: Pcp, No as PCP - General Pickenpack-Cousar, [name], NP as Nurse Practitioner (Nurse Practitioner) [name], MD as Consulting Physician (Hematology)  Date of Service:  06/14/2022  CHIEF COMPLAINT: f/u of metastatic rectal cancer  CURRENT THERAPY:  Second-line FOLFIRI, q2weeks, started 02/22/22 -Bevacizumab started 11/24/21  ASSESSMENT & PLAN:  Jorge [name] is a 71 y.o. male with   1. 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 CT AP 07/23/21 showed rectal mass with no significant adenopathy or distant metastasis. Colonoscopy 08/19/21 showed a 7 cm infiltrative mass in the mid rectum (6-13cm from anal verge), pathology showed adenocarcinoma. MMR is normal -staging pelvic MRI 08/27/21 showed: staging at T4b; N2 likely associated with early extra mesorectal lymph node involvement; distance from tumor to internal anal sphincter is 1.2 cm; also with extramural venous involvement. He has biopsy proven lung mets -baseline CEA 571.84 on 09/06/21. -he received concurrent chemoRT with Xeloda 09/06/21 - 09/26/21, followed by first-line CAPOX and bevacizumab on 10/14/21. Xeloda dose reduced due to elevated liver enzymes. -restaging CT CAP on 01/23/22 showed: decrease in size of pulmonary nodules; approximately 10 new liver lesions. Abdomen MRI on 02/10/22 confirmed new liver lesions. -we switched to second-line FOLFIRI, with continued beva, on 02/21/22, with dose-reduced irinotecan for cycle 1. He tolerated well overall. -restaging CT CAP 05/15/22 showed decreased lung metastasis, stable liver lesions, no other new or progressive disease.  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   2. Symptom Management: HTN, Weight loss, Neuropathy -elevated BP secondary to beva; we started him on low-dose amlodipine on 03/02/22. He is compliant and endorses taking daily. 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 He is using gabapentin and cymbalta for better control, in the hopes of weaning off oxycodone. He is followed by NP Lexine Baton.   3. Multiple colon polyps -He has a large 3.5 cm polyps in the hepatic flexure, biopsy showed tubular adenoma. In the context of his stage IV rectal cancer, he prefers to defer resection for now, which we agree -He had additional 4 small polyps which were removed, path showed tubular adenomas.     PLAN: -f/u with NP Nikki today -proceed with C9 FOLFIRI with same dose and bevacizumab today             -Fulphila injection with pump d/c on day 3 -lab, flush, f/u, and FOLFIRI/Beva on 8/22   No problem-specific Assessment & Plan notes found for this encounter.   SUMMARY OF ONCOLOGIC HISTORY: Oncology History Overview Note  Cancer Staging Rectal cancer [facility]) Staging form: Colon and Rectum, AJCC 8th Edition - Clinical stage from 08/29/2021: [name], cM1 - Signed by [name], MD on 08/29/2021    Rectal cancer ([place])  07/23/2021 Imaging   CT AP  IMPRESSION: 1. Appearance of the rectum likely indicates a rectal mass suspicious for rectal carcinoma. Inflammatory process would be less likely. Direct visualization is suggested. 2. Cholelithiasis without evidence of acute cholecystitis. 3. Aortic atherosclerosis.   08/19/2021 Procedure   Colonoscopy, under Dr. [name]  Impression: - Rectal tenderness, palpable rectal mass and hemorrhoids found on digital rectal exam. - Stool in the entire examined colon - lavaged with still inadequate clearance. - One  35 mm polyp at the hepatic flexure. Biopsied. Tattooed distal in case future endoscopic resection is considered - however,  has larger issues with rectal mass currently as below. - Four, 5 to 11 mm polyps in the transverse colon and at the hepatic flexure, removed with a cold snare. Resected and retrieved. - Diverticulosis in the recto-sigmoid colon and in the sigmoid colon. - Rule out malignancy, partially obstructing tumor in the rectum and from 6 to 13 cm proximal to the anus. Biopsied.   08/19/2021 Pathology Results   Diagnosis 1. Hepatic Flexure Biopsy - TUBULAR ADENOMA WITHOUT HIGH-GRADE DYSPLASIA OR MALIGNANCY 2. Transverse Colon Biopsy, and hepatic flexure, polyps (4) - TUBULAR ADENOMA WITHOUT HIGH-GRADE DYSPLASIA OR MALIGNANCY - OTHER FRAGMENTS OF POLYPOID COLONIC MUCOSA WITH NO SPECIFIC HISTOPATHOLOGIC CHANGES - FOOD MATERIAL 3. Rectum, biopsy - ADENOCARCINOMA. SEE NOTE   08/22/2021 Initial Diagnosis   Rectal cancer ([place])   08/26/2021 Imaging   IMPRESSION: 1. A 2.2 x 1.9 cm right middle lobe pulmonary nodule as well as a total of four right upper lobe pulmonary nodule and masses measuring up to 3.6 cm. Findings concerning for metastatic primary lung cancer versus less likely metastases in a patient with rectal cancer. Additional imaging evaluation or consultation with Pulmonology or Thoracic Surgery recommended. 2. No gross hilar adenopathy, noting limited sensitivity for the detection of hilar adenopathy on this noncontrast study. 3. Cholelithiasis. 4.  Emphysema (ICD10-J43.9). 5. At least left anterior descending coronary artery calcifications.   08/27/2021 Imaging   IMPRESSION: Rectal adenocarcinoma T stage: T4 B   Rectal adenocarcinoma N stage: N2 disease likely associated with early extra mesorectal lymph node involvement.   Distance from tumor to the internal anal sphincter is 1.2 cm.   Also with extramural venous involvement as described.   08/29/2021 Cancer Staging   Staging form: Colon and Rectum, AJCC 8th Edition - Clinical stage from 08/29/2021: [name], cM1 - Signed by  [name], MD on 08/29/2021 Stage prefix: Initial diagnosis   01/23/2022 Imaging   CT CAP w contrast IMPRESSION: 1. Mild interval decrease in size of multiple right-sided pulmonary nodules. No new suspicious pulmonary nodule or mass. 2. Interval development of approximately 10 new ill-defined hypoattenuating liver lesions ranging in size from approximately 5-10 mm. Imaging features suspicious for metastatic disease. MRI abdomen with and without contrast may prove helpful to further evaluate. 3. Similar appearance of wall thickening in the rectum with perirectal edema. 4. Cholelithiasis. 5. Prostatomegaly. 6. Aortic Atherosclerosis (ICD10-I70.0).   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[place])  10/06/2021 Initial Diagnosis   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[place])   10/14/2021 - 01/25/2022 Chemotherapy   Patient is on Treatment Plan : COLORECTAL CapeOx + Bevacizumab q21d     02/21/2022 -  Chemotherapy   Patient is on Treatment Plan : COLORECTAL FOLFIRI / BEVACIZUMAB Q14D        INTERVAL HISTORY:  Jorge [name] is here for a follow up of metastatic rectal cancer. He was last seen by me on 05/30/22 with palliative care visits in the interim. He presents to the clinic alone. He reports his neuropathy is better controlled on gabapentin and cymbalta, as well as oxycodone.    All other systems were reviewed with the patient and are negative.  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 ([place]) 08/19/2021    SURGICAL HISTORY: Past Surgical History:  Procedure Laterality Date   BRONCHIAL BIOPSY  09/27/2021   Procedure: BRONCHIAL BIOPSIES;  Surgeon: Garner Nash, DO;  Location: [place] [facility];  Service: Pulmonary;;   BRONCHIAL BRUSHINGS  09/27/2021   Procedure: BRONCHIAL BRUSHINGS;  Surgeon: Garner Nash, DO;  Location: [place] [facility];  Service: Pulmonary;;   BRONCHIAL NEEDLE ASPIRATION  BIOPSY  09/27/2021   Procedure: BRONCHIAL NEEDLE ASPIRATION BIOPSIES;  Surgeon: Garner Nash, DO;  Location: [facility];  Service: Pulmonary;;   COLONOSCOPY     IR IMAGING GUIDED PORT INSERTION  10/24/2021   left breast surgery Left 1968   VIDEO BRONCHOSCOPY WITH RADIAL ENDOBRONCHIAL ULTRASOUND  09/27/2021   Procedure: RADIAL ENDOBRONCHIAL ULTRASOUND;  Surgeon: Garner Nash, DO;  Location: [place] [facility];  Service: Pulmonary;;    I have reviewed the social history and family history with the patient and they are unchanged from previous note.  ALLERGIES:  has No Known Allergies.  MEDICATIONS:  Current Outpatient Medications  Medication Sig Dispense Refill   amLODipine (NORVASC) 5 MG tablet TAKE 1 TABLET (5 MG TOTAL) BY MOUTH DAILY. 30 tablet 0   carbonyl iron (FEOSOL) 45 MG TABS tablet Take 45 mg by mouth daily.     DULoxetine (CYMBALTA) 20 MG capsule Take 1 capsule (20 mg total) by mouth daily. 30 capsule 0   gabapentin (NEURONTIN) 100 MG capsule Take 2 capsules (200 mg total) by mouth 2 (two) times daily. 120 capsule 0   Multiple Vitamin (MULTIVITAMIN WITH MINERALS) TABS tablet Take 1 tablet by mouth daily.     ondansetron (ZOFRAN) 8 MG tablet Take 1 tablet (8 mg total) by mouth every 8 (eight) hours as needed for nausea or vomiting. 30 tablet 0   oxyCODONE (OXY IR/ROXICODONE) 5 MG immediate release tablet Take 1 tablet (5 mg total) by mouth every 8 (eight) hours as needed for severe pain or breakthrough pain. 45 tablet 0   No current facility-administered medications for this visit.   Facility-Administered Medications Ordered in Other Visits  Medication Dose Route Frequency Provider Last Rate Last Admin   atropine injection 0.5 mg  0.5 mg Intravenous Once PRN [name], MD       bevacizumab-bvzr (ZIRABEV) 350 mg in sodium chloride 0.9 % 100 mL chemo infusion  5 mg/kg (Treatment Plan Recorded) Intravenous Once [name], MD       dexamethasone (DECADRON) 10 mg in sodium  chloride 0.9 % 50 mL IVPB  10 mg Intravenous Once [name], MD 204 mL/hr at 06/14/22 1124 10 mg at 06/14/22 1124   fluorouracil (ADRUCIL) 4,450 mg in sodium chloride 0.9 % 61 mL chemo infusion  2,400 mg/m2 (Treatment Plan Recorded) Intravenous 1 day or 1 dose [name], MD       heparin lock flush 100 unit/mL  500 Units Intracatheter Once PRN [name], MD       irinotecan (CAMPTOSAR) 300 mg in sodium chloride 0.9 % 500 mL chemo infusion  160 mg/m2 (Treatment Plan Recorded) Intravenous Once [name], MD       leucovorin 744 mg in sodium chloride 0.9 % 250 mL infusion  400 mg/m2 (Treatment Plan Recorded) Intravenous Once [name], MD       sodium chloride flush (NS) 0.9 % injection 10 mL  10 mL Intracatheter PRN [name], MD        PHYSICAL EXAMINATION: ECOG PERFORMANCE STATUS: 1 - Symptomatic but completely ambulatory  Vitals:   06/14/22 0928  BP: (!) 162/90  Pulse: 60  Temp: 98.3 F (36.8 C)  SpO2: 99%   Wt  Readings from Last 3 Encounters:  06/14/22 147 lb 1.6 oz (66.7 kg)  06/01/22 153 lb 8 oz (69.6 kg)  05/30/22 154 lb 8 oz (70.1 kg)     GENERAL:alert, no distress and comfortable SKIN: skin color normal, no rashes or significant lesions EYES: normal, Conjunctiva are pink and non-injected, sclera clear  NEURO: alert & oriented x 3 with fluent speech  LABORATORY DATA:  I have reviewed the data as listed    Latest Ref Rng & Units 06/14/2022    9:10 AM 05/30/2022   11:22 AM 05/16/2022   10:19 AM  CBC  WBC 4.0 - 10.5 K/uL 5.9  7.7  7.1   Hemoglobin 13.0 - 17.0 g/dL 14.7  13.5  13.4   Hematocrit 39.0 - 52.0 % 44.6  40.7  40.3   Platelets 150 - 400 K/uL 152  149  145         Latest Ref Rng & Units 06/14/2022   10:04 AM 05/30/2022   11:22 AM 05/16/2022   10:19 AM  CMP  Glucose 70 - 99 mg/dL 96  98  122   BUN 8 - 23 mg/dL '9  9  10   ' Creatinine 0.61 - 1.24 mg/dL 0.83  1.09  1.05   Sodium 135 - 145 mmol/L 137  139  136   Potassium 3.5 - 5.1 mmol/L 4.0  4.5  4.1   Chloride  98 - 111 mmol/L 105  106  104   CO2 22 - 32 mmol/L '30  30  29   ' Calcium 8.9 - 10.3 mg/dL 9.0  8.7  8.8   Total Protein 6.5 - 8.1 g/dL 7.2  6.5  6.5   Total Bilirubin 0.3 - 1.2 mg/dL 0.4  0.3  0.3   Alkaline Phos 38 - 126 U/L 146  179  153   AST 15 - 41 U/L '22  20  18   ' ALT 0 - 44 U/L '16  15  13       ' RADIOGRAPHIC STUDIES: I have personally reviewed the radiological images as listed and agreed with the findings in the report. No results found.    Orders Placed This Encounter  Procedures   CEA (IN HOUSE-[facility])   All questions were answered. The patient knows to call the clinic with any problems, questions or concerns. No barriers to learning was detected. The total time spent in the appointment was 30 minutes.     [name], MD 06/14/2022   I, Wilburn Mylar, am acting as scribe for [name], MD.   I have reviewed the above documentation for accuracy and completeness, and I agree with the above.

## 2022-06-14 NOTE — Progress Notes (Signed)
[place]  Telephone:(336) [phone] Fax:(336) [phone]   Name: Jorge [name] Date: 06/14/2022 MRN: [id number]  DOB: Dec 24, 1950  Patient Care Team: Pcp, No as PCP - General Pickenpack-Cousar, Jorge Sax, NP as Nurse Practitioner (Nurse Practitioner) [name], MD as Consulting Physician (Hematology)   INTERVAL HISTORY: Jorge [name] is a 71 y.o. 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  Palliative ask to see for symptom management and goals of care.   SOCIAL HISTORY:     reports that he has been smoking cigarettes. He has a 5.00 pack-year smoking history. He has never used smokeless tobacco. He reports that he does not currently use alcohol. He reports that he does not use drugs.  ADVANCE DIRECTIVES:    CODE STATUS:   PAST MEDICAL HISTORY: 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 ([place]) 08/19/2021    ALLERGIES:  has No Known Allergies.  MEDICATIONS:  Current Outpatient Medications  Medication Sig Dispense Refill   amLODipine (NORVASC) 5 MG tablet TAKE 1 TABLET (5 MG TOTAL) BY MOUTH DAILY. 30 tablet 0   carbonyl iron (FEOSOL) 45 MG TABS tablet Take 45 mg by mouth daily.     DULoxetine (CYMBALTA) 20 MG capsule Take 1 capsule (20 mg total) by mouth daily. 30 capsule 0   gabapentin (NEURONTIN) 100 MG capsule Take 2 capsules (200 mg total) by mouth 2 (two) times daily. 120 capsule 0   Multiple Vitamin (MULTIVITAMIN WITH MINERALS) TABS tablet Take 1 tablet by mouth daily.     ondansetron (ZOFRAN) 8 MG tablet Take 1 tablet (8 mg total) by mouth every 8 (eight) hours as needed for nausea or vomiting. 30 tablet 0   oxyCODONE (OXY IR/ROXICODONE) 5 MG immediate release tablet Take 1 tablet (5 mg total) by mouth every 8 (eight) hours as needed for severe pain or breakthrough pain. 45 tablet 0   No current  facility-administered medications for this visit.   Facility-Administered Medications Ordered in Other Visits  Medication Dose Route Frequency Provider Last Rate Last Admin   atropine injection 0.5 mg  0.5 mg Intravenous Once PRN [name], MD       bevacizumab-bvzr (ZIRABEV) 350 mg in sodium chloride 0.9 % 100 mL chemo infusion  5 mg/kg (Treatment Plan Recorded) Intravenous Once [name], MD       dexamethasone (DECADRON) 10 mg in sodium chloride 0.9 % 50 mL IVPB  10 mg Intravenous Once [name], MD       fluorouracil (ADRUCIL) 4,450 mg in sodium chloride 0.9 % 61 mL chemo infusion  2,400 mg/m2 (Treatment Plan Recorded) Intravenous 1 day or 1 dose [name], MD       heparin lock flush 100 unit/mL  500 Units Intracatheter Once PRN [name], MD       irinotecan (CAMPTOSAR) 300 mg in sodium chloride 0.9 % 500 mL chemo infusion  160 mg/m2 (Treatment Plan Recorded) Intravenous Once [name], MD       leucovorin 744 mg in sodium chloride 0.9 % 250 mL infusion  400 mg/m2 (Treatment Plan Recorded) Intravenous Once [name], MD       palonosetron (ALOXI) injection 0.25 mg  0.25 mg Intravenous Once [name], MD       sodium chloride flush (NS) 0.9 % injection 10 mL  10 mL Intracatheter PRN [name], MD  VITAL SIGNS: There were no vitals taken for this visit. There were no vitals filed for this visit.  Estimated body mass index is 19.95 kg/m as calculated from the following:   Height as of 05/30/22: 6' (1.829 m).   Weight as of an earlier encounter on 06/14/22: 147 lb 1.6 oz (66.7 kg).   PERFORMANCE STATUS (ECOG) : 1 - Symptomatic but completely ambulatory   IMPRESSION: [name] presents to the clinic today for symptom management follow-up. No acute distress noted. Reports improvement in neuropathic pain over the past several days. He was able to get to his appointment this morning via bus. Continues to walk frequently to remain active.   Denies nausea, vomiting, diarrhea, or  constipation. Is having daily bowel movements. Appetite is improving.  His weight is 147lbs today down from 153lb on 7/27. However prior to weight was steady around 147-150lbs. We will continue to closely monitor.   Neuropathic Pain  [name] shares his pain has improved over the past several days. He is tolerating cymbalta and gabapentin. Continues to use Oxy IR for severe breakthrough pain. We discussed use of medications for his type of pain and what is most effective. He is appreciative of his improvement. Reports he was able to walk around more and pain is not as severe. The "on fire" feeling has decreased. Continues to have some tingling however this is also improved.   Discussions regarding continuing his current regimen with no expected changes on today. He verbalized understanding and appreciation.     7/27: Jorge [name] endorses ongoing hands/feet burning and tingling.  He states when he gets up to walk he must stand for a few minutes and gain his bearings to allow his feet to catch it with his body and also be able to prepare himself for the discomfort.  He does have some difficulty with opening items due to discomfort in his hands.  He reports there is not anything that makes the numbness and tingling better other than medication.  Ambulating long distances, standing for long periods of time, and utilizing his hands for certain activities escalates his discomfort.  Education provided on neuropathic pain and ways to effectively manage with a goal of hopefully improving symptoms.  He understands symptoms may not resolve but if we are able to decrease his discomfort this will be ideal for his quality of life.  We reviewed at length his current regimen: Gabapentin 200 mg twice daily and Oxy IR 5 mg every 6 hours as needed.  We discussed the use of both medications with understanding that for his neuropathic pain gabapentin is probably most effective.  He verbalized understanding and has had similar  conversations with [name]. He reports he only uses his Oxy IR when pain is severe.  States he averages 1-3 tablets/day.  Most days he is taking 2 tablets.  He confirms he is taking gabapentin as prescribed.  He does notice some drowsiness at times however is able to remain active throughout the day.  Education provided on use of Cymbalta in addition to his gabapentin for ongoing neuropathic pain support.  He understands the goal will be to hopefully discontinue use of Oxy IR and allow these 2 medications to manage his symptoms.  He verbalized understanding and appreciation.  We discussed Her current illness and what it means in the larger context of Her on-going co-morbidities. Natural disease trajectory and expectations were discussed.  I discussed the importance of continued conversation with family and their medical providers regarding  overall plan of care and treatment options, ensuring decisions are within the context of the patients values and GOCs.  PLAN:  Gabapentin '200mg'$  twice daily.  Cymbalta '20mg'$  daily. Tolerating well.  Oxy IR '5mg'$  every 8 hours as needed I will plan to see patient back in 3-4 weeks in collaboration with his oncology appointments.   Patient expressed understanding and was in agreement with this plan. He also understands that He can call the clinic at any time with any questions, concerns, or complaints.        Any controlled substances utilized were prescribed in the context of palliative care. PDMP has been reviewed.    Time Total: 30 min   Visit consisted of counseling and education dealing with the complex and emotionally intense issues of symptom management and palliative care in the setting of serious and potentially life-threatening illness.Greater than 50%  of this time was spent counseling and coordinating care related to the above assessment and plan.  [name], AGPCNP-BC  Palliative Medicine Team/[facility]

## 2022-06-14 NOTE — Patient Instructions (Signed)
[facility] MEDICAL ONCOLOGY  Discharge Instructions: Thank you for choosing [facility] to provide your oncology and hematology care.   If you have a lab appointment with the Cancer Center, please go directly to the Cancer Center and check in at the registration area.   Wear comfortable clothing and clothing appropriate for easy access to any Portacath or PICC line.   We strive to give you quality time with your provider. You may need to reschedule your appointment if you arrive late (15 or more minutes).  Arriving late affects you and other patients whose appointments are after yours.  Also, if you miss three or more appointments without notifying the office, you may be dismissed from the clinic at the provider's discretion.      For prescription refill requests, have your pharmacy contact our office and allow 72 hours for refills to be completed.    Today you received the following chemotherapy and/or immunotherapy agents: Zirabev/Irinotecan/Leucovorin/Fluorouracil      To help prevent nausea and vomiting after your treatment, we encourage you to take your nausea medication as directed.  BELOW ARE SYMPTOMS THAT SHOULD BE REPORTED IMMEDIATELY: *FEVER GREATER THAN 100.4 F (38 C) OR HIGHER *CHILLS OR SWEATING *NAUSEA AND VOMITING THAT IS NOT CONTROLLED WITH YOUR NAUSEA MEDICATION *UNUSUAL SHORTNESS OF BREATH *UNUSUAL BRUISING OR BLEEDING *URINARY PROBLEMS (pain or burning when urinating, or frequent urination) *BOWEL PROBLEMS (unusual diarrhea, constipation, pain near the anus) TENDERNESS IN MOUTH AND THROAT WITH OR WITHOUT PRESENCE OF ULCERS (sore throat, sores in mouth, or a toothache) UNUSUAL RASH, SWELLING OR PAIN  UNUSUAL VAGINAL DISCHARGE OR ITCHING   Items with * indicate a potential emergency and should be followed up as soon as possible or go to the Emergency Department if any problems should occur.  Please show the CHEMOTHERAPY ALERT CARD or  IMMUNOTHERAPY ALERT CARD at check-in to the Emergency Department and triage nurse.  Should you have questions after your visit or need to cancel or reschedule your appointment, please contact [facility]  Dept: [phone]  and follow the prompts.  Office hours are 8:00 a.m. to 4:30 p.m. Monday - Friday. Please note that voicemails left after 4:00 p.m. may not be returned until the following business day.  We are closed weekends and major holidays. You have access to a nurse at all times for urgent questions. Please call the main number to the clinic Dept: [phone] and follow the prompts.   For any non-urgent questions, you may also contact your provider using MyChart. We now offer e-Visits for anyone 18 and older to request care online for non-urgent symptoms. For details visit [email or web address].   Also download the MyChart app! Go to the app store, search "MyChart", open the app, select [facility], and log in with your MyChart username and password.  Due to Covid, a mask is required upon entering the hospital/clinic. If you do not have a mask, one will be given to you upon arrival. For doctor visits, patients may have 1 support person aged 18 or older with them. For treatment visits, patients cannot have anyone with them due to current Covid guidelines and our immunocompromised population.   The chemotherapy medication bag should finish at 46 hours, 96 hours, or 7 days. For example, if your pump is scheduled for 46 hours and it was put on at 4:00 p.m., it should finish at 2:00 p.m. the day it is scheduled to come off regardless of your   appointment time.     Estimated time to finish at 08:45 on Saturday 04/08/2022   If the display on your pump reads "Low Volume" and it is beeping, take the batteries out of the pump and come to the cancer center for it to be taken off.   If the pump alarms go off prior to the pump reading "Low Volume" then call [phone]  and someone can assist you.  If the plunger comes out and the chemotherapy medication is leaking out, please use your home chemo spill kit to clean up the spill. Do NOT use paper towels or other household products.  If you have problems or questions regarding your pump, please call either [phone] (24 hours a day) or the cancer center Monday-Friday 8:00 a.m.- 4:30 p.m. at the clinic number and we will assist you. If you are unable to get assistance, then go to the nearest Emergency Department and ask the staff to contact the IV team for assistance.

## 2022-06-15 ENCOUNTER — Telehealth: Payer: Self-pay | Admitting: Hematology

## 2022-06-15 NOTE — Telephone Encounter (Signed)
Scheduled follow-up appointments per 8/9 los. Patient's daughter is aware.

## 2022-06-16 ENCOUNTER — Inpatient Hospital Stay: Payer: Medicare Other

## 2022-06-16 ENCOUNTER — Other Ambulatory Visit: Payer: Self-pay | Admitting: Nurse Practitioner

## 2022-06-16 ENCOUNTER — Other Ambulatory Visit: Payer: Self-pay

## 2022-06-16 VITALS — BP 132/82 | HR 74 | Temp 98.6°F | Resp 16

## 2022-06-16 DIAGNOSIS — Z5112 Encounter for antineoplastic immunotherapy: Secondary | ICD-10-CM | POA: Diagnosis not present

## 2022-06-16 DIAGNOSIS — C20 Malignant neoplasm of rectum: Secondary | ICD-10-CM

## 2022-06-16 DIAGNOSIS — Z515 Encounter for palliative care: Secondary | ICD-10-CM

## 2022-06-16 DIAGNOSIS — G893 Neoplasm related pain (acute) (chronic): Secondary | ICD-10-CM

## 2022-06-16 MED ORDER — PEGFILGRASTIM-JMDB 6 MG/0.6ML ~~LOC~~ SOSY
6.0000 mg | PREFILLED_SYRINGE | Freq: Once | SUBCUTANEOUS | Status: AC
  Administered 2022-06-16: 6 mg via SUBCUTANEOUS
  Filled 2022-06-16: qty 0.6

## 2022-06-16 MED ORDER — SODIUM CHLORIDE 0.9% FLUSH
10.0000 mL | INTRAVENOUS | Status: DC | PRN
  Administered 2022-06-16: 10 mL

## 2022-06-16 MED ORDER — OXYCODONE HCL 5 MG PO TABS: 5.0000 mg | ORAL_TABLET | Freq: Three times a day (TID) | ORAL | 0 refills | Status: DC | PRN

## 2022-06-16 MED ORDER — HEPARIN SOD (PORK) LOCK FLUSH 100 UNIT/ML IV SOLN
500.0000 [IU] | Freq: Once | INTRAVENOUS | Status: AC | PRN
  Administered 2022-06-16: 500 [IU]

## 2022-06-26 MED FILL — Dexamethasone Sodium Phosphate Inj 100 MG/10ML: INTRAMUSCULAR | Qty: 1 | Status: AC

## 2022-06-26 NOTE — Progress Notes (Unsigned)
[place]   Telephone:(336) [phone] [phone]) [phone]   Clinic Follow up Note   Patient Care Team: Pcp, No as PCP - General Pickenpack-Cousar, Jorge Sax, NP as Nurse Practitioner (Nurse Practitioner) Jorge Merle, MD as Consulting Physician (Hematology) 06/27/2022  CHIEF COMPLAINT: Follow up metastatic rectal cancer   SUMMARY OF ONCOLOGIC HISTORY: Oncology History Overview Note  Cancer Staging Rectal cancer [facility]) Staging form: Colon and Rectum, AJCC 8th Edition - Clinical stage from 08/29/2021: [name], cM1 - Signed by Jorge Merle, MD on 08/29/2021    Rectal cancer ([place])  07/23/2021 Imaging   CT AP  IMPRESSION: 1. Appearance of the rectum likely indicates a rectal mass suspicious for rectal carcinoma. Inflammatory process would be less likely. Direct visualization is suggested. 2. Cholelithiasis without evidence of acute cholecystitis. 3. Aortic atherosclerosis.   08/19/2021 Procedure   Colonoscopy, under Dr. Rush [name]  Impression: - Rectal tenderness, palpable rectal mass and hemorrhoids found on digital rectal exam. - Stool in the entire examined colon - lavaged with still inadequate clearance. - One 35 mm polyp at the hepatic flexure. Biopsied. Tattooed distal in case future endoscopic resection is considered - however, has larger issues with rectal mass currently as below. - Four, 5 to 11 mm polyps in the transverse colon and at the hepatic flexure, removed with a cold snare. Resected and retrieved. - Diverticulosis in the recto-sigmoid colon and in the sigmoid colon. - Rule out malignancy, partially obstructing tumor in the rectum and from 6 to 13 cm proximal to the anus. Biopsied.   08/19/2021 Pathology Results   Diagnosis 1. Hepatic Flexure Biopsy - TUBULAR ADENOMA WITHOUT HIGH-GRADE DYSPLASIA OR MALIGNANCY 2. Transverse Colon Biopsy, and hepatic flexure, polyps (4) - TUBULAR ADENOMA WITHOUT HIGH-GRADE DYSPLASIA OR MALIGNANCY - OTHER FRAGMENTS OF  POLYPOID COLONIC MUCOSA WITH NO SPECIFIC HISTOPATHOLOGIC CHANGES - FOOD MATERIAL 3. Rectum, biopsy - ADENOCARCINOMA. SEE NOTE   08/22/2021 Initial Diagnosis   Rectal cancer (Jorge [name])   08/26/2021 Imaging   IMPRESSION: 1. A 2.2 x 1.9 cm right middle lobe pulmonary nodule as well as a total of four right upper lobe pulmonary nodule and masses measuring up to 3.6 cm. Findings concerning for metastatic primary lung cancer versus less likely metastases in a patient with rectal cancer. Additional imaging evaluation or consultation with Pulmonology or Thoracic Surgery recommended. 2. No gross hilar adenopathy, noting limited sensitivity for the detection of hilar adenopathy on this noncontrast study. 3. Cholelithiasis. 4.  Emphysema (ICD10-J43.9). 5. At least left anterior descending coronary artery calcifications.   08/27/2021 Imaging   IMPRESSION: Rectal adenocarcinoma T stage: T4 B   Rectal adenocarcinoma N stage: N2 disease likely associated with early extra mesorectal lymph node involvement.   Distance from tumor to the internal anal sphincter is 1.2 cm.   Also with extramural venous involvement as described.   08/29/2021 Cancer Staging   Staging form: Colon and Rectum, AJCC 8th Edition - Clinical stage from 08/29/2021: [name], cM1 - Signed by Jorge Merle, MD on 08/29/2021 Stage prefix: Initial diagnosis   01/23/2022 Imaging   CT CAP w contrast IMPRESSION: 1. Mild interval decrease in size of multiple right-sided pulmonary nodules. No new suspicious pulmonary nodule or mass. 2. Interval development of approximately 10 new ill-defined hypoattenuating liver lesions ranging in size from approximately 5-10 mm. Imaging features suspicious for metastatic disease. MRI abdomen with and without contrast may prove helpful to further evaluate. 3. Similar appearance of wall thickening in the rectum with perirectal edema. 4. Cholelithiasis. 5.  Prostatomegaly. 6. Aortic Atherosclerosis  (ICD10-I70.0).   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 returns for follow up as scheduled. Last seen by Dr. Burr [name] 06/14/22 and completed cycle 9.  Neuropathy has improved on gabapentin 200 mg BID and Cymbalta 20 mg daily but still bothersome.  He continues to have pain and cold sensation in his feet and numbness in his hands.  He is occasionally off balance but no fall.  Takes oxycodone 1-2 times daily which helps with his energy and he can walk better.  Has not had headaches in a while.  Bowels are more frequent occasionally but not diarrhea.  His grandkids have had colds recently, he has a minor cough but no other symptoms, no fever, chills, chest pain, dyspnea.  All other systems were reviewed with the patient and are negative.  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 ([place]) 08/19/2021    SURGICAL HISTORY: Past Surgical History:  Procedure Laterality Date   BRONCHIAL BIOPSY  09/27/2021   Procedure: BRONCHIAL BIOPSIES;  Surgeon: Jorge Nash, DO;  Location: [facility];  Service: Pulmonary;;   BRONCHIAL BRUSHINGS  09/27/2021   Procedure: BRONCHIAL BRUSHINGS;  Surgeon: Jorge Nash, DO;  Location: [facility];  Service: Pulmonary;;   BRONCHIAL NEEDLE ASPIRATION BIOPSY  09/27/2021   Procedure: BRONCHIAL NEEDLE ASPIRATION BIOPSIES;  Surgeon: Jorge Nash, DO;  Location: [facility];  Service: Pulmonary;;   COLONOSCOPY     IR IMAGING GUIDED PORT INSERTION  10/24/2021    left breast surgery Left 1968   VIDEO BRONCHOSCOPY WITH RADIAL ENDOBRONCHIAL ULTRASOUND  09/27/2021   Procedure: RADIAL ENDOBRONCHIAL ULTRASOUND;  Surgeon: Jorge Nash, DO;  Location: [facility];  Service: Pulmonary;;    I have reviewed the social history and family history with the patient and they are unchanged from previous note.  ALLERGIES:  has No Known Allergies.  MEDICATIONS:  Current Outpatient Medications  Medication Sig Dispense Refill   amLODipine (NORVASC) 5 MG tablet Take 1 tablet (5 mg total) by mouth daily. 30 tablet 2   carbonyl iron (FEOSOL) 45 MG TABS tablet Take 45 mg by mouth daily.     DULoxetine (CYMBALTA) 20 MG capsule Take 1 capsule (20 mg total) by mouth daily. 30 capsule 0   gabapentin (NEURONTIN) 100 MG capsule Take 2 capsules (200 mg total) by mouth 2 (two) times daily. 120 capsule 0   Multiple Vitamin (MULTIVITAMIN WITH MINERALS) TABS tablet Take 1 tablet by mouth daily.     ondansetron (ZOFRAN) 8 MG tablet Take 1 tablet (8 mg total) by mouth every 8 (eight) hours as needed for nausea or vomiting. 30 tablet 0   oxyCODONE (OXY IR/ROXICODONE) 5 MG immediate release tablet Take 1 tablet (5 mg total) by mouth every 8 (eight) hours as needed for severe pain or breakthrough pain. 45 tablet 0   No current facility-administered medications for this visit.    PHYSICAL EXAMINATION: ECOG PERFORMANCE STATUS: 1 - Symptomatic but completely ambulatory  Vitals:  06/27/22 0939  BP: (!) 151/78  Pulse: 72  Resp: 15  Temp: 97.9 F (36.6 C)  SpO2: 100%   Filed Weights   06/27/22 0939  Weight: 149 lb (67.6 kg)    GENERAL:alert, no distress and comfortable SKIN: No rash EYES: sclera clear LUNGS: clear with normal breathing effort HEART: regular rate & rhythm, no lower extremity edema ABDOMEN:abdomen soft, non-tender and normal bowel sounds NEURO: alert & oriented x 3 with fluent speech, no focal motor/sensory deficits.  Intact peripheral vibratory sense  over the fingertips per tuning fork exam PAC without erythema  LABORATORY DATA:  I have reviewed the data as listed    Latest Ref Rng & Units 06/27/2022    9:16 AM 06/14/2022    9:10 AM 05/30/2022   11:22 AM  CBC  WBC 4.0 - 10.5 K/uL 7.9  5.9  7.7   Hemoglobin 13.0 - 17.0 g/dL 14.1  14.7  13.5   Hematocrit 39.0 - 52.0 % 42.5  44.6  40.7   Platelets 150 - 400 K/uL 177  152  149         Latest Ref Rng & Units 06/14/2022   10:04 AM 05/30/2022   11:22 AM 05/16/2022   10:19 AM  CMP  Glucose 70 - 99 mg/dL 96  98  122   BUN 8 - 23 mg/dL '9  9  10   ' Creatinine 0.61 - 1.24 mg/dL 0.83  1.09  1.05   Sodium 135 - 145 mmol/L 137  139  136   Potassium 3.5 - 5.1 mmol/L 4.0  4.5  4.1   Chloride 98 - 111 mmol/L 105  106  104   CO2 22 - 32 mmol/L '30  30  29   ' Calcium 8.9 - 10.3 mg/dL 9.0  8.7  8.8   Total Protein 6.5 - 8.1 g/dL 7.2  6.5  6.5   Total Bilirubin 0.3 - 1.2 mg/dL 0.4  0.3  0.3   Alkaline Phos 38 - 126 U/L 146  179  153   AST 15 - 41 U/L '22  20  18   ' ALT 0 - 44 U/L '16  15  13       ' RADIOGRAPHIC STUDIES: I have personally reviewed the radiological images as listed and agreed with the findings in the report. No results found.   ASSESSMENT & PLAN: Jorge [name] is a 71 y.o. male with    1. Rectal Adenocarcinoma, [id number] with lung metastasis, MMR normal  -Presented with rectal bleeding and frequent bowel movement. CT AP 07/23/21 showed rectal mass with no significant adenopathy or distant metastasis. Colonoscopy 08/19/21 showed a 7 cm infiltrative mass in the mid rectum (6-13cm from anal verge), pathology showed adenocarcinoma. MMR is normal -staging pelvic MRI 08/27/21 showed: staging at T4b; N2 likely associated with early extra mesorectal lymph node involvement; distance from tumor to internal anal sphincter is 1.2 cm; also with extramural venous involvement. -baseline CEA 571.84 on 09/06/21. -he received concurrent chemoRT with Xeloda 11/1-11/21/22 under the care of Dr. [name].   He responded clinically, no recurrent rectal bleeding, his pain has improved  -CEA has decreased on treatment -he underwent bronchscopy and biopsy of the RUL and RML masses on 09/27/21, which were consistent with metastatic rectal cancer.  -He began first-line CAPOX and bevacizumab on 10/14/21, taking Xeloda 1500 mg BID 2 weeks on/1 week off. He tolerated very well overall. C2 held for transaminitis.  Cycle 4 oxaliplatin dose reduced for cytopenias.  Otherwise tolerated  well -Restaging CT CAP 01/23/2022 showed decrease in size of pulmonary nodules but showed approximately 10 new small liver lesions -Further evaluation with MR abdomen/7/23 showed multiple indeterminate liver lesions, new compared to prior CT therefore concerning for metastatic disease.  This had not been biopsy-proven -He changed to second line FOLFIRI and bevacizumab with dose reduced irinotecan with cycle 1 on 02/21/2022, tolerated well and increased to full dose on C2 But then decreased again with cycle 4 due to neuropathy -Restaging CT CAP 05/15/2022 showed decreased lung metastasis, stable liver lesions, no other new or progressive disease.  Continue same regimen -History [place] appears stable.  He continues FOLFIRI/bevacizumab every 2 weeks.  He tolerates treatment well overall.  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   -He is able to recover and function well with good performance status.  There is no clinical evidence of disease progression.  Anticipate restaging in October, or sooner if he develops new symptoms or rising CEA -Labs reviewed, adequate to proceed with another cycle of FOLFIRI/Beva at same dose today as planned -Return in 2 weeks for next cycle, then he may want an additional week off in September but not sure if his schedule yet   2. Symptom Management: Rectal pain, weight loss, fatigue, elevated blood pressure -Secondary to rectal  cancer and treatment -Improved after chemo RT -was taking MS contin sporadically so we stopped it -takes oxycodone 1-2x daily, mainly now for CIPN.  Trying to wean -He is gaining weight, continue follow-up with dietitian -BP slightly improved on amlodipine, he has a lot of stress at home   3. Mild anemia from rectal bleeding, probable iron deficiency -He has started oral iron, tolerating well.  We will continue. -hgb overall stable, mild anemia at 11-12.8 range -Anemia resolved as of 02/15/2022 -He developed dark stools on iron, he can reduce to MWF -Continue monitoring   4. Pulmonary nodules/metastatic rectal adenocarcinoma -Seen on chest CT 08/26/21 showing 2.2 cm RML nodule and four RUL nodules/masses measuring up to 3.6 cm. Findings concerning for metastatic primary lung cancer. -He met with Dr. Valeta Harms who performed bronchoscopy. The RML and RUL lesions were positive for metastatic rectal adenocarcinoma.    Plan: -Labs reviewed -Proceed with FOLFIRI/Beva today, same dose; G-CSF on day 3 -Palliative care follow-up today in infusion -Follow-up in 2 weeks with next cycle, he will let us know what his schedule is like in September for further appointments   Orders Placed This Encounter  Procedures   Total Protein, Urine dipstick    Standing Status:   Standing    Number of Occurrences:   10    Standing Expiration Date:   06/28/2023   All questions were answered. The patient knows to call the clinic with any problems, questions or concerns. No barriers to learning were detected.      Alla Feeling, NP 06/27/22

## 2022-06-27 ENCOUNTER — Other Ambulatory Visit: Payer: Self-pay

## 2022-06-27 ENCOUNTER — Encounter: Payer: Self-pay | Admitting: Nurse Practitioner

## 2022-06-27 ENCOUNTER — Inpatient Hospital Stay: Payer: Medicare Other

## 2022-06-27 ENCOUNTER — Inpatient Hospital Stay (HOSPITAL_BASED_OUTPATIENT_CLINIC_OR_DEPARTMENT_OTHER): Payer: Medicare Other | Admitting: Nurse Practitioner

## 2022-06-27 VITALS — BP 151/78 | HR 72 | Temp 97.9°F | Resp 15 | Wt 149.0 lb

## 2022-06-27 DIAGNOSIS — Z515 Encounter for palliative care: Secondary | ICD-10-CM | POA: Diagnosis not present

## 2022-06-27 DIAGNOSIS — C7800 Secondary malignant neoplasm of unspecified lung: Secondary | ICD-10-CM | POA: Diagnosis not present

## 2022-06-27 DIAGNOSIS — C20 Malignant neoplasm of rectum: Secondary | ICD-10-CM | POA: Diagnosis not present

## 2022-06-27 DIAGNOSIS — G893 Neoplasm related pain (acute) (chronic): Secondary | ICD-10-CM

## 2022-06-27 DIAGNOSIS — Z95828 Presence of other vascular implants and grafts: Secondary | ICD-10-CM

## 2022-06-27 DIAGNOSIS — Z5112 Encounter for antineoplastic immunotherapy: Secondary | ICD-10-CM | POA: Diagnosis not present

## 2022-06-27 LAB — COMPREHENSIVE METABOLIC PANEL
ALT: 13 U/L (ref 0–44)
AST: 17 U/L (ref 15–41)
Albumin: 3.5 g/dL (ref 3.5–5.0)
Alkaline Phosphatase: 171 U/L — ABNORMAL HIGH (ref 38–126)
Anion gap: 2 — ABNORMAL LOW (ref 5–15)
BUN: 11 mg/dL (ref 8–23)
CO2: 31 mmol/L (ref 22–32)
Calcium: 9.2 mg/dL (ref 8.9–10.3)
Chloride: 105 mmol/L (ref 98–111)
Creatinine, Ser: 0.84 mg/dL (ref 0.61–1.24)
GFR, Estimated: >60 mL/min (ref >60)
Glucose, Bld: 86 mg/dL (ref 70–99)
Potassium: 4 mmol/L (ref 3.5–5.1)
Sodium: 138 mmol/L (ref 135–145)
Total Bilirubin: 0.3 mg/dL (ref 0.3–1.2)
Total Protein: 6.5 g/dL (ref 6.5–8.1)

## 2022-06-27 LAB — CBC WITH DIFFERENTIAL/PLATELET
Abs Immature Granulocytes: 0.04 10*3/uL (ref 0.00–0.07)
Basophils Absolute: 0 10*3/uL (ref 0.0–0.1)
Basophils Relative: 1 %
Eosinophils Absolute: 0.2 10*3/uL (ref 0.0–0.5)
Eosinophils Relative: 3 %
HCT: 42.5 % (ref 39.0–52.0)
Hemoglobin: 14.1 g/dL (ref 13.0–17.0)
Immature Granulocytes: 1 %
Lymphocytes Relative: 10 %
Lymphs Abs: 0.8 10*3/uL (ref 0.7–4.0)
MCH: 30.2 pg (ref 26.0–34.0)
MCHC: 33.2 g/dL (ref 30.0–36.0)
MCV: 91 fL (ref 80.0–100.0)
Monocytes Absolute: 1 10*3/uL (ref 0.1–1.0)
Monocytes Relative: 12 %
Neutro Abs: 5.9 10*3/uL (ref 1.7–7.7)
Neutrophils Relative %: 73 %
Platelets: 177 10*3/uL (ref 150–400)
RBC: 4.67 MIL/uL (ref 4.22–5.81)
RDW: 14.6 % (ref 11.5–15.5)
WBC: 7.9 10*3/uL (ref 4.0–10.5)
nRBC: 0 % (ref 0.0–0.2)

## 2022-06-27 LAB — TOTAL PROTEIN, URINE DIPSTICK: Protein, ur: <30 mg/dL — AB

## 2022-06-27 MED ORDER — GABAPENTIN 100 MG PO CAPS
200.0000 mg | ORAL_CAPSULE | Freq: Three times a day (TID) | ORAL | 0 refills | Status: DC
Start: 2022-06-27 — End: 2022-07-25

## 2022-06-27 MED ORDER — PALONOSETRON HCL INJECTION 0.25 MG/5ML
0.2500 mg | Freq: Once | INTRAVENOUS | Status: AC
  Administered 2022-06-27: 0.25 mg via INTRAVENOUS
  Filled 2022-06-27: qty 5

## 2022-06-27 MED ORDER — SODIUM CHLORIDE 0.9 % IV SOLN
400.0000 mg/m2 | Freq: Once | INTRAVENOUS | Status: AC
  Administered 2022-06-27: 744 mg via INTRAVENOUS
  Filled 2022-06-27: qty 25

## 2022-06-27 MED ORDER — SODIUM CHLORIDE 0.9 % IV SOLN
2400.0000 mg/m2 | INTRAVENOUS | Status: DC
  Administered 2022-06-27: 4450 mg via INTRAVENOUS
  Filled 2022-06-27: qty 89

## 2022-06-27 MED ORDER — SODIUM CHLORIDE 0.9 % IV SOLN
160.0000 mg/m2 | Freq: Once | INTRAVENOUS | Status: AC
  Administered 2022-06-27: 300 mg via INTRAVENOUS
  Filled 2022-06-27: qty 15

## 2022-06-27 MED ORDER — SODIUM CHLORIDE 0.9 % IV SOLN: Freq: Once | INTRAVENOUS | Status: AC

## 2022-06-27 MED ORDER — SODIUM CHLORIDE 0.9 % IV SOLN
5.0000 mg/kg | Freq: Once | INTRAVENOUS | Status: AC
  Administered 2022-06-27: 350 mg via INTRAVENOUS
  Filled 2022-06-27: qty 14

## 2022-06-27 MED ORDER — SODIUM CHLORIDE 0.9% FLUSH
10.0000 mL | Freq: Once | INTRAVENOUS | Status: AC
  Administered 2022-06-27: 10 mL

## 2022-06-27 MED ORDER — AMLODIPINE BESYLATE 5 MG PO TABS: 5.0000 mg | ORAL_TABLET | Freq: Every day | ORAL | 2 refills | Status: DC

## 2022-06-27 MED ORDER — SODIUM CHLORIDE 0.9 % IV SOLN
10.0000 mg | Freq: Once | INTRAVENOUS | Status: AC
  Administered 2022-06-27: 10 mg via INTRAVENOUS
  Filled 2022-06-27: qty 10

## 2022-06-27 MED ORDER — ATROPINE SULFATE 1 MG/ML IV SOLN
0.5000 mg | Freq: Once | INTRAVENOUS | Status: AC | PRN
  Administered 2022-06-27: 0.5 mg via INTRAVENOUS
  Filled 2022-06-27: qty 1

## 2022-06-27 MED ORDER — DULOXETINE HCL 20 MG PO CPEP
20.0000 mg | ORAL_CAPSULE | Freq: Every day | ORAL | 0 refills | Status: DC
Start: 2022-06-27 — End: 2022-07-25

## 2022-06-27 NOTE — Progress Notes (Signed)
Jorge [name]  [phone]) [phone] [phone]) [phone]   Name: Jorge [name] Date: 06/27/2022 MRN: [id number]  DOB: 1951/09/21  Patient Care Team: Pcp, No as PCP - General Pickenpack-Cousar, [name], NP as Nurse Practitioner (Nurse Practitioner) [name], MD as Consulting Physician (Hematology)   INTERVAL HISTORY: Jorge [name] is a 71 y.o. 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  Palliative ask to see for symptom management and goals of care.   SOCIAL HISTORY:     reports that he has been smoking cigarettes. He has a 5.00 pack-year smoking history. He has never used smokeless tobacco. He reports that he does not currently use alcohol. He reports that he does not use drugs.  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  MEDICATIONS:  Current Outpatient Medications  Medication Sig Dispense Refill   amLODipine (NORVASC) 5 MG tablet Take 1 tablet (5 mg total) by mouth daily. 30 tablet 2   carbonyl iron (FEOSOL) 45 MG TABS tablet Take 45 mg by mouth daily.     DULoxetine (CYMBALTA) 20 MG capsule Take 1 capsule (20 mg total) by mouth daily. 30 capsule 0   gabapentin (NEURONTIN) 100 MG capsule Take 2 capsules (200 mg total) by mouth 3 (three) times daily. 180 capsule 0   Multiple Vitamin (MULTIVITAMIN WITH MINERALS) TABS tablet Take 1 tablet by mouth daily.     ondansetron (ZOFRAN) 8 MG tablet Take 1 tablet (8 mg total) by mouth every 8 (eight) hours as needed for nausea or vomiting. 30 tablet 0   oxyCODONE (OXY IR/ROXICODONE) 5 MG immediate release tablet Take 1 tablet (5 mg total) by mouth every 8 (eight) hours as needed for severe pain or breakthrough pain. 45 tablet 0   No current  facility-administered medications for this visit.   Facility-Administered Medications Ordered in Other Visits  Medication Dose Route Frequency Provider Last Rate Last Admin   fluorouracil (ADRUCIL) 4,450 mg in sodium chloride 0.9 % 61 mL chemo infusion  2,400 mg/m2 (Treatment Plan Recorded) Intravenous 1 day or 1 dose [name], MD   Infusion Verify at 06/27/22 1316    VITAL SIGNS: There were no vitals taken for this visit. There were no vitals filed for this visit.  Estimated body mass index is 20.21 kg/m as calculated from the following:   Height as of 05/30/22: 6' (1.829 m).   Weight as of an earlier encounter on 06/27/22: 149 lb (67.6 kg).   PERFORMANCE STATUS (ECOG) : 1 - Symptomatic but completely ambulatory  Assessment NAD, ambulatory RRR Normal breathing pattern  AAO x3, mood appropriate    IMPRESSION: I saw Jorge [name] during his infusion today for symptom management follow-up. No acute distress noted. Reports his neuropathy is better with some occasional discomfort. Continues to walk frequently to remain active.   Neuropathic Pain  Jorge [name] shares his pain has improved. Some days are better than others. He is tolerating cymbalta and gabapentin. Continues to use Oxy IR for severe breakthrough pain. Reports he may take oxycodone 1-2 times per day depending on level of discomfort. We discussed increasing gabapentin to three times daily with a goal of hopefully offering more improvement. Reports he was able to walk around more and  pain is not as severe. The "on fire" feeling has decreased. Continues to have some tingling however this is also improved.   Discussions regarding continuing his current regimen with no expected changes on today. He verbalized understanding and appreciation.     7/27: Jorge [name] endorses ongoing hands/feet burning and tingling.  He states when he gets up to walk he must stand for a few minutes and gain his bearings to allow his feet to catch it with his  body and also be able to prepare himself for the discomfort.  He does have some difficulty with opening items due to discomfort in his hands.  He reports there is not anything that makes the numbness and tingling better other than medication.  Ambulating long distances, standing for long periods of time, and utilizing his hands for certain activities escalates his discomfort.  Education provided on neuropathic pain and ways to effectively manage with a goal of hopefully improving symptoms.  He understands symptoms may not resolve but if we are able to decrease his discomfort this will be ideal for his quality of life.  We reviewed at length his current regimen: Gabapentin 200 mg twice daily and Oxy IR 5 mg every 6 hours as needed.  We discussed the use of both medications with understanding that for his neuropathic pain gabapentin is probably most effective.  He verbalized understanding and has had similar conversations with [name]. He reports he only uses his Oxy IR when pain is severe.  States he averages 1-3 tablets/day.  Most days he is taking 2 tablets.  He confirms he is taking gabapentin as prescribed.  He does notice some drowsiness at times however is able to remain active throughout the day.  Education provided on use of Cymbalta in addition to his gabapentin for ongoing neuropathic pain support.  He understands the goal will be to hopefully discontinue use of Oxy IR and allow these 2 medications to manage his symptoms.  He verbalized understanding and appreciation.  We discussed Her current illness and what it means in the larger context of Her on-going co-morbidities. Natural disease trajectory and expectations were discussed.  I discussed the importance of continued conversation with family and their medical providers regarding overall plan of care and treatment options, ensuring decisions are within the context of the patients values and GOCs.  PLAN:  Gabapentin '200mg'$  three times daily.   Cymbalta '20mg'$  daily. Tolerating well.  Oxy IR '5mg'$  every 8 hours as needed. Only requiring 1-2 times daily and some days not at all.  I will plan to see patient back in 3-4 weeks in collaboration with his oncology appointments.   Patient expressed understanding and was in agreement with this plan. He also understands that He can call the clinic at any time with any questions, concerns, or complaints.   Any controlled substances utilized were prescribed in the context of palliative care. PDMP has been reviewed.    Time Total: 35 min   Visit consisted of counseling and education dealing with the complex and emotionally intense issues of symptom management and palliative care in the setting of serious and potentially life-threatening illness.Greater than 50%  of this time was spent counseling and coordinating care related to the above assessment and plan.  [name], AGPCNP-BC  Palliative Medicine Team/[place]

## 2022-06-29 ENCOUNTER — Other Ambulatory Visit: Payer: Self-pay

## 2022-06-29 ENCOUNTER — Inpatient Hospital Stay: Payer: Medicare Other

## 2022-06-29 VITALS — BP 146/86 | HR 78 | Temp 98.6°F | Resp 16

## 2022-06-29 DIAGNOSIS — C20 Malignant neoplasm of rectum: Secondary | ICD-10-CM

## 2022-06-29 DIAGNOSIS — Z5112 Encounter for antineoplastic immunotherapy: Secondary | ICD-10-CM | POA: Diagnosis not present

## 2022-06-29 MED ORDER — SODIUM CHLORIDE 0.9% FLUSH
10.0000 mL | INTRAVENOUS | Status: DC | PRN
  Administered 2022-06-29: 10 mL

## 2022-06-29 MED ORDER — PEGFILGRASTIM-JMDB 6 MG/0.6ML ~~LOC~~ SOSY
6.0000 mg | PREFILLED_SYRINGE | Freq: Once | SUBCUTANEOUS | Status: AC
  Administered 2022-06-29: 6 mg via SUBCUTANEOUS
  Filled 2022-06-29: qty 0.6

## 2022-06-29 MED ORDER — HEPARIN SOD (PORK) LOCK FLUSH 100 UNIT/ML IV SOLN
500.0000 [IU] | Freq: Once | INTRAVENOUS | Status: AC | PRN
  Administered 2022-06-29: 500 [IU]

## 2022-06-30 ENCOUNTER — Other Ambulatory Visit: Payer: Self-pay | Admitting: Nurse Practitioner

## 2022-06-30 DIAGNOSIS — C7800 Secondary malignant neoplasm of unspecified lung: Secondary | ICD-10-CM

## 2022-06-30 DIAGNOSIS — Z515 Encounter for palliative care: Secondary | ICD-10-CM

## 2022-06-30 DIAGNOSIS — G893 Neoplasm related pain (acute) (chronic): Secondary | ICD-10-CM

## 2022-06-30 MED ORDER — OXYCODONE HCL 5 MG PO TABS: 5.0000 mg | ORAL_TABLET | Freq: Three times a day (TID) | ORAL | 0 refills | Status: DC | PRN

## 2022-07-10 ENCOUNTER — Other Ambulatory Visit: Payer: Self-pay | Admitting: Hematology

## 2022-07-10 DIAGNOSIS — C7800 Secondary malignant neoplasm of unspecified lung: Secondary | ICD-10-CM

## 2022-07-12 MED FILL — Dexamethasone Sodium Phosphate Inj 100 MG/10ML: INTRAMUSCULAR | Qty: 1 | Status: AC

## 2022-07-13 ENCOUNTER — Encounter: Payer: Self-pay | Admitting: Hematology

## 2022-07-13 ENCOUNTER — Other Ambulatory Visit: Payer: Self-pay

## 2022-07-13 ENCOUNTER — Inpatient Hospital Stay (HOSPITAL_BASED_OUTPATIENT_CLINIC_OR_DEPARTMENT_OTHER): Payer: Medicare Other | Admitting: Hematology

## 2022-07-13 ENCOUNTER — Inpatient Hospital Stay: Payer: Medicare Other | Attending: Physician Assistant

## 2022-07-13 ENCOUNTER — Encounter: Payer: Self-pay | Admitting: Nurse Practitioner

## 2022-07-13 ENCOUNTER — Inpatient Hospital Stay (HOSPITAL_BASED_OUTPATIENT_CLINIC_OR_DEPARTMENT_OTHER): Payer: Medicare Other | Admitting: Nurse Practitioner

## 2022-07-13 ENCOUNTER — Inpatient Hospital Stay: Payer: Medicare Other

## 2022-07-13 VITALS — BP 139/77 | HR 64 | Temp 98.2°F | Resp 17 | Wt 147.0 lb

## 2022-07-13 DIAGNOSIS — Z515 Encounter for palliative care: Secondary | ICD-10-CM

## 2022-07-13 DIAGNOSIS — C20 Malignant neoplasm of rectum: Secondary | ICD-10-CM

## 2022-07-13 DIAGNOSIS — C7800 Secondary malignant neoplasm of unspecified lung: Secondary | ICD-10-CM

## 2022-07-13 DIAGNOSIS — Z5112 Encounter for antineoplastic immunotherapy: Secondary | ICD-10-CM | POA: Insufficient documentation

## 2022-07-13 DIAGNOSIS — G893 Neoplasm related pain (acute) (chronic): Secondary | ICD-10-CM | POA: Diagnosis not present

## 2022-07-13 DIAGNOSIS — C775 Secondary and unspecified malignant neoplasm of intrapelvic lymph nodes: Secondary | ICD-10-CM | POA: Insufficient documentation

## 2022-07-13 DIAGNOSIS — Z5189 Encounter for other specified aftercare: Secondary | ICD-10-CM | POA: Diagnosis not present

## 2022-07-13 DIAGNOSIS — Z5111 Encounter for antineoplastic chemotherapy: Secondary | ICD-10-CM | POA: Insufficient documentation

## 2022-07-13 DIAGNOSIS — Z79899 Other long term (current) drug therapy: Secondary | ICD-10-CM | POA: Insufficient documentation

## 2022-07-13 DIAGNOSIS — M792 Neuralgia and neuritis, unspecified: Secondary | ICD-10-CM | POA: Diagnosis not present

## 2022-07-13 DIAGNOSIS — C787 Secondary malignant neoplasm of liver and intrahepatic bile duct: Secondary | ICD-10-CM | POA: Insufficient documentation

## 2022-07-13 DIAGNOSIS — F17210 Nicotine dependence, cigarettes, uncomplicated: Secondary | ICD-10-CM | POA: Diagnosis not present

## 2022-07-13 DIAGNOSIS — Z95828 Presence of other vascular implants and grafts: Secondary | ICD-10-CM

## 2022-07-13 LAB — CBC WITH DIFFERENTIAL (CANCER CENTER ONLY)
Abs Immature Granulocytes: 0.03 10*3/uL (ref 0.00–0.07)
Basophils Absolute: 0.1 10*3/uL (ref 0.0–0.1)
Basophils Relative: 1 %
Eosinophils Absolute: 0.4 10*3/uL (ref 0.0–0.5)
Eosinophils Relative: 7 %
HCT: 42.2 % (ref 39.0–52.0)
Hemoglobin: 14.1 g/dL (ref 13.0–17.0)
Immature Granulocytes: 1 %
Lymphocytes Relative: 18 %
Lymphs Abs: 0.9 10*3/uL (ref 0.7–4.0)
MCH: 30.4 pg (ref 26.0–34.0)
MCHC: 33.4 g/dL (ref 30.0–36.0)
MCV: 90.9 fL (ref 80.0–100.0)
Monocytes Absolute: 0.8 10*3/uL (ref 0.1–1.0)
Monocytes Relative: 15 %
Neutro Abs: 3.1 10*3/uL (ref 1.7–7.7)
Neutrophils Relative %: 58 %
Platelet Count: 182 10*3/uL (ref 150–400)
RBC: 4.64 MIL/uL (ref 4.22–5.81)
RDW: 14.9 % (ref 11.5–15.5)
WBC Count: 5.3 10*3/uL (ref 4.0–10.5)
nRBC: 0 % (ref 0.0–0.2)

## 2022-07-13 LAB — CMP (CANCER CENTER ONLY)
ALT: 17 U/L (ref 0–44)
AST: 25 U/L (ref 15–41)
Albumin: 3.7 g/dL (ref 3.5–5.0)
Alkaline Phosphatase: 167 U/L — ABNORMAL HIGH (ref 38–126)
Anion gap: 3 — ABNORMAL LOW (ref 5–15)
BUN: 15 mg/dL (ref 8–23)
CO2: 29 mmol/L (ref 22–32)
Calcium: 9 mg/dL (ref 8.9–10.3)
Chloride: 104 mmol/L (ref 98–111)
Creatinine: 0.91 mg/dL (ref 0.61–1.24)
GFR, Estimated: >60 mL/min (ref >60)
Glucose, Bld: 89 mg/dL (ref 70–99)
Potassium: 4.2 mmol/L (ref 3.5–5.1)
Sodium: 136 mmol/L (ref 135–145)
Total Bilirubin: 0.3 mg/dL (ref 0.3–1.2)
Total Protein: 6.9 g/dL (ref 6.5–8.1)

## 2022-07-13 LAB — CEA (IN HOUSE-CHCC): CEA (CHCC-In House): 17.36 ng/mL — ABNORMAL HIGH (ref 0.00–5.00)

## 2022-07-13 MED ORDER — SODIUM CHLORIDE 0.9% FLUSH
10.0000 mL | Freq: Once | INTRAVENOUS | Status: AC
  Administered 2022-07-13: 10 mL

## 2022-07-13 MED ORDER — SODIUM CHLORIDE 0.9 % IV SOLN
400.0000 mg/m2 | Freq: Once | INTRAVENOUS | Status: AC
  Administered 2022-07-13: 740 mg via INTRAVENOUS
  Filled 2022-07-13: qty 37

## 2022-07-13 MED ORDER — SODIUM CHLORIDE 0.9 % IV SOLN
160.0000 mg/m2 | Freq: Once | INTRAVENOUS | Status: AC
  Administered 2022-07-13: 300 mg via INTRAVENOUS
  Filled 2022-07-13: qty 15

## 2022-07-13 MED ORDER — SODIUM CHLORIDE 0.9 % IV SOLN
2400.0000 mg/m2 | INTRAVENOUS | Status: DC
  Administered 2022-07-13: 4450 mg via INTRAVENOUS
  Filled 2022-07-13: qty 89

## 2022-07-13 MED ORDER — PALONOSETRON HCL INJECTION 0.25 MG/5ML
0.2500 mg | Freq: Once | INTRAVENOUS | Status: AC
  Administered 2022-07-13: 0.25 mg via INTRAVENOUS
  Filled 2022-07-13: qty 5

## 2022-07-13 MED ORDER — ATROPINE SULFATE 1 MG/ML IV SOLN
0.5000 mg | Freq: Once | INTRAVENOUS | Status: AC | PRN
  Administered 2022-07-13: 0.5 mg via INTRAVENOUS
  Filled 2022-07-13: qty 1

## 2022-07-13 MED ORDER — SODIUM CHLORIDE 0.9 % IV SOLN
5.0000 mg/kg | Freq: Once | INTRAVENOUS | Status: AC
  Administered 2022-07-13: 350 mg via INTRAVENOUS
  Filled 2022-07-13: qty 14

## 2022-07-13 MED ORDER — SODIUM CHLORIDE 0.9 % IV SOLN
10.0000 mg | Freq: Once | INTRAVENOUS | Status: AC
  Administered 2022-07-13: 10 mg via INTRAVENOUS
  Filled 2022-07-13: qty 10

## 2022-07-13 MED ORDER — SODIUM CHLORIDE 0.9 % IV SOLN: Freq: Once | INTRAVENOUS | Status: AC

## 2022-07-13 MED ORDER — OXYCODONE HCL 5 MG PO TABS: 5.0000 mg | ORAL_TABLET | Freq: Three times a day (TID) | ORAL | 0 refills | Status: DC | PRN

## 2022-07-13 NOTE — Progress Notes (Signed)
[facility]  [phone] [phone]) [phone]   Name: Jorge [name] Date: 07/13/2022 MRN: [id number]  DOB: 02/12/1951  Patient Care Team: Pcp, No as PCP - General Pickenpack-Cousar, [name], NP as Nurse Practitioner (Nurse Practitioner) [name], MD as Consulting Physician (Hematology)   INTERVAL HISTORY: Jorge [name] is a 71 y.o. 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  Palliative ask to see for symptom management and goals of care.   SOCIAL HISTORY:     reports that he has been smoking cigarettes. He has a 5.00 pack-year smoking history. He has never used smokeless tobacco. He reports that he does not currently use alcohol. He reports that he does not use drugs.  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  MEDICATIONS:  Current Outpatient Medications  Medication Sig Dispense Refill   amLODipine (NORVASC) 5 MG tablet Take 1 tablet (5 mg total) by mouth daily. 30 tablet 2   carbonyl iron (FEOSOL) 45 MG TABS tablet Take 45 mg by mouth daily.     DULoxetine (CYMBALTA) 20 MG capsule Take 1 capsule (20 mg total) by mouth daily. 30 capsule 0   gabapentin (NEURONTIN) 100 MG capsule Take 2 capsules (200 mg total) by mouth 3 (three) times daily. 180 capsule 0   Multiple Vitamin (MULTIVITAMIN WITH MINERALS) TABS tablet Take 1 tablet by mouth daily.     ondansetron (ZOFRAN) 8 MG tablet Take 1 tablet (8 mg total) by mouth every 8 (eight) hours as needed for nausea or vomiting. 30 tablet 0   oxyCODONE (OXY IR/ROXICODONE) 5 MG immediate release tablet Take 1 tablet (5 mg total) by mouth every 8 (eight) hours as needed for severe pain or breakthrough pain. 45 tablet 0   No current  facility-administered medications for this visit.   Facility-Administered Medications Ordered in Other Visits  Medication Dose Route Frequency Provider Last Rate Last Admin   fluorouracil (ADRUCIL) 4,450 mg in sodium chloride 0.9 % 61 mL chemo infusion  2,400 mg/m2 (Treatment Plan Recorded) Intravenous 1 day or 1 dose [name], MD   Infusion Verify at 07/13/22 1552    VITAL SIGNS: There were no vitals taken for this visit. There were no vitals filed for this visit.  Estimated body mass index is 19.94 kg/m as calculated from the following:   Height as of 05/30/22: 6' (1.829 m).   Weight as of an earlier encounter on 07/13/22: 147 lb (66.7 kg).   PERFORMANCE STATUS (ECOG) : 1 - Symptomatic but completely ambulatory  Assessment NAD, ambulatory RRR Normal breathing pattern  AAO x3, mood appropriate    IMPRESSION: Jorge [name] presents to clinic today for symptom management follow-up. No acute distress noted. Reports his neuropathy is better with some occasional discomfort. Continues to walk frequently to remain active. Assist in caring for his grandchildren. Shares his proudness of his granddaughter who is a IT sales professional at [employer].   Neuropathic Pain  Jorge [name] shares his pain has improved. Some days are better than others. He is tolerating cymbalta and gabapentin. Continues to use Oxy IR for severe breakthrough pain. Reports he may take oxycodone 1-2 times per day depending on level of discomfort. We discussed increasing gabapentin to three times  daily with a goal of hopefully offering more improvement. He states he forgot to increase to three times a day and have only been taking twice. Acknowledges neuropathic pain flares up around 12-1. Encouraged him to begin taking additional dose during the day to allow for better control with goal of decreasing Oxycodone use. The "on fire" feeling has decreased. Continues to have some tingling however this is also improved.   Discussions  regarding continuing his current regimen with emphasis on taking gabapentin three times daily. He verbalized understanding and appreciation.     7/27: Jorge [name] endorses ongoing hands/feet burning and tingling.  He states when he gets up to walk he must stand for a few minutes and gain his bearings to allow his feet to catch it with his body and also be able to prepare himself for the discomfort.  He does have some difficulty with opening items due to discomfort in his hands.  He reports there is not anything that makes the numbness and tingling better other than medication.  Ambulating long distances, standing for long periods of time, and utilizing his hands for certain activities escalates his discomfort.  Education provided on neuropathic pain and ways to effectively manage with a goal of hopefully improving symptoms.  He understands symptoms may not resolve but if we are able to decrease his discomfort this will be ideal for his quality of life.  We reviewed at length his current regimen: Gabapentin 200 mg twice daily and Oxy IR 5 mg every 6 hours as needed.  We discussed the use of both medications with understanding that for his neuropathic pain gabapentin is probably most effective.  He verbalized understanding and has had similar conversations with [name]. He reports he only uses his Oxy IR when pain is severe.  States he averages 1-3 tablets/day.  Most days he is taking 2 tablets.  He confirms he is taking gabapentin as prescribed.  He does notice some drowsiness at times however is able to remain active throughout the day.  Education provided on use of Cymbalta in addition to his gabapentin for ongoing neuropathic pain support.  He understands the goal will be to hopefully discontinue use of Oxy IR and allow these 2 medications to manage his symptoms.  He verbalized understanding and appreciation.  We discussed Her current illness and what it means in the larger context of Her on-going  co-morbidities. Natural disease trajectory and expectations were discussed.  I discussed the importance of continued conversation with family and their medical providers regarding overall plan of care and treatment options, ensuring decisions are within the context of the patients values and GOCs.  PLAN:  Gabapentin '200mg'$  three times daily. He has only been taking twice daily. Will increase.  Cymbalta '20mg'$  daily. Tolerating well.  Oxy IR '5mg'$  every 8 hours as needed. Only requiring 1-2 times daily and some days not at all.  I will plan to see patient back in 3-4 weeks in collaboration with his oncology appointments.   Patient expressed understanding and was in agreement with this plan. He also understands that He can call the clinic at any time with any questions, concerns, or complaints.        Any controlled substances utilized were prescribed in the context of palliative care. PDMP has been reviewed.   Time Total: 35 min  Visit consisted of counseling and education dealing with the complex and emotionally intense issues of symptom management and palliative care in the setting of serious and potentially life-threatening  illness.Greater than 50%  of this time was spent counseling and coordinating care related to the above assessment and plan.  [name], AGPCNP-BC  Palliative Medicine Team/[place] [place]

## 2022-07-13 NOTE — Progress Notes (Signed)
[facility]   [phone] Fax:([phone]   Clinic Follow up Note   Patient Care Team: Pcp, No as PCP - General Pickenpack-Cousar, [name], NP as Nurse Practitioner (Nurse Practitioner) [name], MD as Consulting Physician (Hematology)  Date of Service:  07/13/2022  CHIEF COMPLAINT: f/u of metastatic rectal cancer  CURRENT THERAPY:  Second-line FOLFIRI, q2weeks, started 02/22/22 -Bevacizumab started 11/24/21  ASSESSMENT & PLAN:  Jorge [name] is a 71 y.o. male with   1. 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 CT AP 07/23/21 showed rectal mass with no significant adenopathy or distant metastasis. Colonoscopy 08/19/21 showed a 7 cm infiltrative mass in the mid rectum (6-13cm from anal verge), pathology showed adenocarcinoma. MMR is normal -staging pelvic MRI 08/27/21 showed: staging at T4b; N2 likely associated with early extra mesorectal lymph node involvement; distance from tumor to internal anal sphincter is 1.2 cm; also with extramural venous involvement. He has biopsy proven lung mets -baseline CEA 571.84 on 09/06/21. -he received concurrent chemoRT with Xeloda 09/06/21 - 09/26/21, followed by first-line CAPOX and bevacizumab on 10/14/21. Xeloda dose reduced due to elevated liver enzymes. -restaging CT CAP on 01/23/22 showed: decrease in size of pulmonary nodules; approximately 10 new liver lesions. Abdomen MRI on 02/10/22 confirmed new liver lesions. -we switched to second-line FOLFIRI, with continued beva, on 02/21/22, with dose-reduced irinotecan for cycle 1. He tolerated well overall. -restaging CT CAP 05/15/22 showed decreased lung metastasis, stable liver lesions, no other new or progressive disease.  -He is clinically stable. He reports his neuropathy is improving/better under control. Lab reviewed, CBC is WNL. Adequate for treatment, will continue  current chemotherapy. -we discussed that since he will be on chemo most of the time, we are happy to give him small breaks, especially with the holidays coming up. He knows to let us know what he needs. -plan for restaging CT in 08/2022   2. Symptom Management: HTN, Weight loss, Neuropathy -elevated BP secondary to beva; we started him on low-dose amlodipine on 03/02/22. He is compliant and endorses taking daily. 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 He is on gabapentin and cymbalta, taking oxycodone once a day. He is followed by NP Lexine Baton.   3. Multiple colon polyps -He has a large 3.5 cm polyps in the hepatic flexure, biopsy showed tubular adenoma. In the context of his stage IV rectal cancer, he prefers to defer resection for now, which we agree -He had additional 4 small polyps which were removed, path showed tubular adenomas.     PLAN: -f/u with NP Nikki today -proceed with C11 FOLFIRI/beva today at same dose              -Fulphila injection with pump d/c on day 3 -lab, flush, f/u, and FOLFIRI/Beva every 2 weeks   No problem-specific Assessment & Plan notes found for this encounter.   SUMMARY OF ONCOLOGIC HISTORY: Oncology History Overview Note  Cancer Staging Rectal cancer [facility]) Staging form: Colon and Rectum, AJCC 8th Edition - Clinical stage from 08/29/2021: [name], cM1 - Signed by [name], MD on 08/29/2021    Rectal cancer ([place])  07/23/2021 Imaging   CT AP  IMPRESSION: 1. Appearance of the rectum likely indicates a rectal mass suspicious for rectal carcinoma. Inflammatory process would be less likely. Direct visualization is suggested. 2. Cholelithiasis without evidence of acute cholecystitis. 3. Aortic atherosclerosis.  08/19/2021 Procedure   Colonoscopy, under Dr. [name]  Impression: - Rectal tenderness, palpable rectal mass and hemorrhoids found on digital rectal exam. - Stool in the entire examined  colon - lavaged with still inadequate clearance. - One 35 mm polyp at the hepatic flexure. Biopsied. Tattooed distal in case future endoscopic resection is considered - however, has larger issues with rectal mass currently as below. - Four, 5 to 11 mm polyps in the transverse colon and at the hepatic flexure, removed with a cold snare. Resected and retrieved. - Diverticulosis in the recto-sigmoid colon and in the sigmoid colon. - Rule out malignancy, partially obstructing tumor in the rectum and from 6 to 13 cm proximal to the anus. Biopsied.   08/19/2021 Pathology Results   Diagnosis 1. Hepatic Flexure Biopsy - TUBULAR ADENOMA WITHOUT HIGH-GRADE DYSPLASIA OR MALIGNANCY 2. Transverse Colon Biopsy, and hepatic flexure, polyps (4) - TUBULAR ADENOMA WITHOUT HIGH-GRADE DYSPLASIA OR MALIGNANCY - OTHER FRAGMENTS OF POLYPOID COLONIC MUCOSA WITH NO SPECIFIC HISTOPATHOLOGIC CHANGES - FOOD MATERIAL 3. Rectum, biopsy - ADENOCARCINOMA. SEE NOTE   08/22/2021 Initial Diagnosis   Rectal cancer ([place])   08/26/2021 Imaging   IMPRESSION: 1. A 2.2 x 1.9 cm right middle lobe pulmonary nodule as well as a total of four right upper lobe pulmonary nodule and masses measuring up to 3.6 cm. Findings concerning for metastatic primary lung cancer versus less likely metastases in a patient with rectal cancer. Additional imaging evaluation or consultation with Pulmonology or Thoracic Surgery recommended. 2. No gross hilar adenopathy, noting limited sensitivity for the detection of hilar adenopathy on this noncontrast study. 3. Cholelithiasis. 4.  Emphysema (ICD10-J43.9). 5. At least left anterior descending coronary artery calcifications.   08/27/2021 Imaging   IMPRESSION: Rectal adenocarcinoma T stage: T4 B   Rectal adenocarcinoma N stage: N2 disease likely associated with early extra mesorectal lymph node involvement.   Distance from tumor to the internal anal sphincter is 1.2 cm.   Also with extramural  venous involvement as described.   08/29/2021 Cancer Staging   Staging form: Colon and Rectum, AJCC 8th Edition - Clinical stage from 08/29/2021: [name], cM1 - Signed by [name], MD on 08/29/2021 Stage prefix: Initial diagnosis   01/23/2022 Imaging   CT CAP w contrast IMPRESSION: 1. Mild interval decrease in size of multiple right-sided pulmonary nodules. No new suspicious pulmonary nodule or mass. 2. Interval development of approximately 10 new ill-defined hypoattenuating liver lesions ranging in size from approximately 5-10 mm. Imaging features suspicious for metastatic disease. MRI abdomen with and without contrast may prove helpful to further evaluate. 3. Similar appearance of wall thickening in the rectum with perirectal edema. 4. Cholelithiasis. 5. Prostatomegaly. 6. Aortic Atherosclerosis (ICD10-I70.0).   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[place])  10/06/2021 Initial Diagnosis   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[place])   10/14/2021 - 01/25/2022 Chemotherapy   Patient is on Treatment Plan : COLORECTAL CapeOx + Bevacizumab q21d     02/21/2022 - 06/29/2022 Chemotherapy   Patient is on Treatment Plan : COLORECTAL FOLFIRI / BEVACIZUMAB Q14D     02/21/2022 -  Chemotherapy   Patient is on Treatment Plan : COLORECTAL FOLFIRI + Bevacizumab q14d        INTERVAL HISTORY:  Jorge [name] is here for a follow up of metastatic rectal cancer. He was last seen by NP Lacie on 06/27/22. He was seen in the infusion area. He reports his neuropathy is improving. He tells me his great-grandchild is now 30 month old.  All other systems were reviewed with the patient and are negative.  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 ([place]) 08/19/2021    SURGICAL HISTORY: Past Surgical History:  Procedure Laterality Date   BRONCHIAL BIOPSY  09/27/2021   Procedure: BRONCHIAL BIOPSIES;  Surgeon:  Garner Nash, DO;  Location: [facility];  Service: Pulmonary;;   BRONCHIAL BRUSHINGS  09/27/2021   Procedure: BRONCHIAL BRUSHINGS;  Surgeon: Garner Nash, DO;  Location: [facility];  Service: Pulmonary;;   BRONCHIAL NEEDLE ASPIRATION BIOPSY  09/27/2021   Procedure: BRONCHIAL NEEDLE ASPIRATION BIOPSIES;  Surgeon: Garner Nash, DO;  Location: [facility];  Service: Pulmonary;;   COLONOSCOPY     IR IMAGING GUIDED PORT INSERTION  10/24/2021   left breast surgery Left 1968   VIDEO BRONCHOSCOPY WITH RADIAL ENDOBRONCHIAL ULTRASOUND  09/27/2021   Procedure: RADIAL ENDOBRONCHIAL ULTRASOUND;  Surgeon: Garner Nash, DO;  Location: [facility];  Service: Pulmonary;;    I have reviewed the social history and family history with the patient and they are unchanged from previous note.  ALLERGIES:  has No Known Allergies.  MEDICATIONS:  Current Outpatient Medications  Medication Sig Dispense Refill   amLODipine (NORVASC) 5 MG tablet Take 1 tablet (5 mg total) by mouth daily. 30 tablet 2   carbonyl iron (FEOSOL) 45 MG TABS tablet Take 45 mg by mouth daily.     DULoxetine (CYMBALTA) 20 MG capsule Take 1 capsule (20 mg total) by mouth daily. 30 capsule 0   gabapentin (NEURONTIN) 100 MG capsule Take 2 capsules (200 mg total) by mouth 3 (three) times daily. 180 capsule 0   Multiple Vitamin (MULTIVITAMIN WITH MINERALS) TABS tablet Take 1 tablet by mouth daily.     ondansetron (ZOFRAN) 8 MG tablet Take 1 tablet (8 mg total) by mouth every 8 (eight) hours as needed for nausea or vomiting. 30 tablet 0   oxyCODONE (OXY IR/ROXICODONE) 5 MG immediate release tablet Take 1 tablet (5 mg total) by mouth every 8 (eight) hours as needed for severe pain or breakthrough pain. 45 tablet 0   No current facility-administered medications for this visit.   Facility-Administered Medications Ordered in Other Visits  Medication Dose Route Frequency Provider Last Rate Last Admin   fluorouracil (ADRUCIL)  4,450 mg in sodium chloride 0.9 % 61 mL chemo infusion  2,400 mg/m2 (Treatment Plan Recorded) Intravenous 1 day or 1 dose [name], MD   4,450 mg at 07/13/22 1421    PHYSICAL EXAMINATION: ECOG PERFORMANCE STATUS: 1 - Symptomatic but completely ambulatory  There were no vitals filed for this visit. Wt Readings from Last 3 Encounters:  07/13/22 147 lb (66.7 kg)  06/27/22 149 lb (67.6 kg)  06/14/22 147 lb 1.6 oz (66.7 kg)     GENERAL:alert, no distress and comfortable SKIN: skin color normal, no rashes or significant lesions EYES: normal, Conjunctiva are pink and non-injected, sclera clear  NEURO: alert & oriented x 3 with fluent speech  LABORATORY DATA:  I have reviewed the data as listed    Latest Ref Rng & Units 07/13/2022   10:14 AM 06/27/2022    9:16 AM 06/14/2022    9:10 AM  CBC  WBC 4.0 - 10.5 K/uL 5.3  7.9  5.9   Hemoglobin 13.0 - 17.0 g/dL 14.1  14.1  14.7   Hematocrit 39.0 - 52.0 % 42.2  42.5  44.6   Platelets 150 - 400  K/uL 182  177  152         Latest Ref Rng & Units 07/13/2022   10:14 AM 06/27/2022    9:16 AM 06/14/2022   10:04 AM  CMP  Glucose 70 - 99 mg/dL 89  86  96   BUN 8 - 23 mg/dL '15  11  9   ' Creatinine 0.61 - 1.24 mg/dL 0.91  0.84  0.83   Sodium 135 - 145 mmol/L 136  138  137   Potassium 3.5 - 5.1 mmol/L 4.2  4.0  4.0   Chloride 98 - 111 mmol/L 104  105  105   CO2 22 - 32 mmol/L '29  31  30   ' Calcium 8.9 - 10.3 mg/dL 9.0  9.2  9.0   Total Protein 6.5 - 8.1 g/dL 6.9  6.5  7.2   Total Bilirubin 0.3 - 1.2 mg/dL 0.3  0.3  0.4   Alkaline Phos 38 - 126 U/L 167  171  146   AST 15 - 41 U/L '25  17  22   ' ALT 0 - 44 U/L '17  13  16       ' RADIOGRAPHIC STUDIES: I have personally reviewed the radiological images as listed and agreed with the findings in the report. No results found.    No orders of the defined types were placed in this encounter.  All questions were answered. The patient knows to call the clinic with any problems, questions or concerns. No  barriers to learning was detected.      [name], MD 07/13/2022   I, Wilburn Mylar, am acting as scribe for [name], MD.   I have reviewed the above documentation for accuracy and completeness, and I agree with the above.

## 2022-07-13 NOTE — Patient Instructions (Signed)
[facility]  Discharge Instructions: Thank you for choosing [facility] to provide your oncology and hematology care.   If you have a lab appointment with the [facility], please go directly to the [facility] and check in at the registration area.   Wear comfortable clothing and clothing appropriate for easy access to any Portacath or PICC line.   We strive to give you quality time with your provider. You may need to reschedule your appointment if you arrive late (15 or more minutes).  Arriving late affects you and other patients whose appointments are after yours.  Also, if you miss three or more appointments without notifying the office, you may be dismissed from the clinic at the provider's discretion.      For prescription refill requests, have your pharmacy contact our office and allow 72 hours for refills to be completed.    Today you received the following chemotherapy and/or immunotherapy agents: bevacizumab, irinotecan, and fluorouracil       To help prevent nausea and vomiting after your treatment, we encourage you to take your nausea medication as directed.  BELOW ARE SYMPTOMS THAT SHOULD BE REPORTED IMMEDIATELY: *FEVER GREATER THAN 100.4 F (38 C) OR HIGHER *CHILLS OR SWEATING *NAUSEA AND VOMITING THAT IS NOT CONTROLLED WITH YOUR NAUSEA MEDICATION *UNUSUAL SHORTNESS OF BREATH *UNUSUAL BRUISING OR BLEEDING *URINARY PROBLEMS (pain or burning when urinating, or frequent urination) *BOWEL PROBLEMS (unusual diarrhea, constipation, pain near the anus) TENDERNESS IN MOUTH AND THROAT WITH OR WITHOUT PRESENCE OF ULCERS (sore throat, sores in mouth, or a toothache) UNUSUAL RASH, SWELLING OR PAIN  UNUSUAL VAGINAL DISCHARGE OR ITCHING   Items with * indicate a potential emergency and should be followed up as soon as possible or go to the Emergency Department if any problems should occur.  Please show the CHEMOTHERAPY ALERT CARD or  IMMUNOTHERAPY ALERT CARD at check-in to the Emergency Department and triage nurse.  Should you have questions after your visit or need to cancel or reschedule your appointment, please contact [facility]  Dept: [phone]  and follow the prompts.  Office hours are 8:00 a.m. to 4:30 p.m. Monday - Friday. Please note that voicemails left after 4:00 p.m. may not be returned until the following business day.  We are closed weekends and major holidays. You have access to a nurse at all times for urgent questions. Please call the main number to the clinic Dept: [phone] and follow the prompts.   For any non-urgent questions, you may also contact your provider using MyChart. We now offer e-Visits for anyone 67 and older to request care online for non-urgent symptoms. For details visit [email or web address].   Also download the MyChart app! Go to the app store, search "MyChart", open the app, select [facility], and log in with your MyChart username and password.  Masks are optional in the cancer centers. If you would like for your care team to wear a mask while they are taking care of you, please let them know. You may have one support person who is at least 71 years old accompany you for your appointments.

## 2022-07-15 ENCOUNTER — Inpatient Hospital Stay: Payer: Medicare Other

## 2022-07-15 VITALS — BP 170/97 | HR 60 | Temp 97.5°F | Resp 18 | Ht 72.0 in

## 2022-07-15 DIAGNOSIS — Z5112 Encounter for antineoplastic immunotherapy: Secondary | ICD-10-CM | POA: Diagnosis not present

## 2022-07-15 DIAGNOSIS — C20 Malignant neoplasm of rectum: Secondary | ICD-10-CM

## 2022-07-15 MED ORDER — PEGFILGRASTIM-JMDB 6 MG/0.6ML ~~LOC~~ SOSY
6.0000 mg | PREFILLED_SYRINGE | Freq: Once | SUBCUTANEOUS | Status: AC
  Administered 2022-07-15: 6 mg via SUBCUTANEOUS

## 2022-07-15 MED ORDER — SODIUM CHLORIDE 0.9% FLUSH
10.0000 mL | INTRAVENOUS | Status: DC | PRN
  Administered 2022-07-15: 10 mL

## 2022-07-15 MED ORDER — HEPARIN SOD (PORK) LOCK FLUSH 100 UNIT/ML IV SOLN
500.0000 [IU] | Freq: Once | INTRAVENOUS | Status: AC | PRN
  Administered 2022-07-15: 500 [IU]

## 2022-07-25 ENCOUNTER — Other Ambulatory Visit: Payer: Self-pay

## 2022-07-25 DIAGNOSIS — Z515 Encounter for palliative care: Secondary | ICD-10-CM

## 2022-07-25 DIAGNOSIS — C7800 Secondary malignant neoplasm of unspecified lung: Secondary | ICD-10-CM

## 2022-07-25 DIAGNOSIS — G893 Neoplasm related pain (acute) (chronic): Secondary | ICD-10-CM

## 2022-07-25 DIAGNOSIS — M792 Neuralgia and neuritis, unspecified: Secondary | ICD-10-CM

## 2022-07-25 MED ORDER — GABAPENTIN 100 MG PO CAPS: 200.0000 mg | ORAL_CAPSULE | Freq: Three times a day (TID) | ORAL | 0 refills | Status: DC

## 2022-07-25 MED ORDER — OXYCODONE HCL 5 MG PO TABS: 5.0000 mg | ORAL_TABLET | Freq: Three times a day (TID) | ORAL | 0 refills | Status: DC | PRN

## 2022-07-25 MED ORDER — DULOXETINE HCL 20 MG PO CPEP: 20.0000 mg | ORAL_CAPSULE | Freq: Every day | ORAL | 0 refills | Status: DC

## 2022-07-31 ENCOUNTER — Inpatient Hospital Stay: Payer: Medicare Other

## 2022-07-31 ENCOUNTER — Other Ambulatory Visit: Payer: Self-pay

## 2022-07-31 ENCOUNTER — Telehealth: Payer: Self-pay

## 2022-07-31 ENCOUNTER — Inpatient Hospital Stay (HOSPITAL_BASED_OUTPATIENT_CLINIC_OR_DEPARTMENT_OTHER): Payer: Medicare Other | Admitting: Adult Health

## 2022-07-31 ENCOUNTER — Encounter: Payer: Self-pay | Admitting: Hematology

## 2022-07-31 ENCOUNTER — Other Ambulatory Visit (HOSPITAL_COMMUNITY): Payer: Self-pay

## 2022-07-31 ENCOUNTER — Encounter: Payer: Self-pay | Admitting: Adult Health

## 2022-07-31 VITALS — BP 150/88 | HR 68 | Temp 97.7°F | Resp 18 | Ht 72.0 in | Wt 146.6 lb

## 2022-07-31 DIAGNOSIS — Z95828 Presence of other vascular implants and grafts: Secondary | ICD-10-CM

## 2022-07-31 DIAGNOSIS — C7800 Secondary malignant neoplasm of unspecified lung: Secondary | ICD-10-CM

## 2022-07-31 DIAGNOSIS — I10 Essential (primary) hypertension: Secondary | ICD-10-CM

## 2022-07-31 DIAGNOSIS — C20 Malignant neoplasm of rectum: Secondary | ICD-10-CM

## 2022-07-31 DIAGNOSIS — D500 Iron deficiency anemia secondary to blood loss (chronic): Secondary | ICD-10-CM

## 2022-07-31 DIAGNOSIS — Z5112 Encounter for antineoplastic immunotherapy: Secondary | ICD-10-CM | POA: Diagnosis not present

## 2022-07-31 LAB — CBC WITH DIFFERENTIAL/PLATELET
Abs Immature Granulocytes: 0.02 10*3/uL (ref 0.00–0.07)
Basophils Absolute: 0 10*3/uL (ref 0.0–0.1)
Basophils Relative: 1 %
Eosinophils Absolute: 0.2 10*3/uL (ref 0.0–0.5)
Eosinophils Relative: 4 %
HCT: 42.4 % (ref 39.0–52.0)
Hemoglobin: 14 g/dL (ref 13.0–17.0)
Immature Granulocytes: 0 %
Lymphocytes Relative: 17 %
Lymphs Abs: 0.9 10*3/uL (ref 0.7–4.0)
MCH: 30 pg (ref 26.0–34.0)
MCHC: 33 g/dL (ref 30.0–36.0)
MCV: 91 fL (ref 80.0–100.0)
Monocytes Absolute: 0.9 10*3/uL (ref 0.1–1.0)
Monocytes Relative: 16 %
Neutro Abs: 3.4 10*3/uL (ref 1.7–7.7)
Neutrophils Relative %: 62 %
Platelets: 144 10*3/uL — ABNORMAL LOW (ref 150–400)
RBC: 4.66 MIL/uL (ref 4.22–5.81)
RDW: 14.7 % (ref 11.5–15.5)
WBC: 5.4 10*3/uL (ref 4.0–10.5)
nRBC: 0 % (ref 0.0–0.2)

## 2022-07-31 LAB — COMPREHENSIVE METABOLIC PANEL
ALT: 17 U/L (ref 0–44)
AST: 24 U/L (ref 15–41)
Albumin: 3.7 g/dL (ref 3.5–5.0)
Alkaline Phosphatase: 135 U/L — ABNORMAL HIGH (ref 38–126)
Anion gap: 3 — ABNORMAL LOW (ref 5–15)
BUN: 14 mg/dL (ref 8–23)
CO2: 29 mmol/L (ref 22–32)
Calcium: 8.8 mg/dL — ABNORMAL LOW (ref 8.9–10.3)
Chloride: 103 mmol/L (ref 98–111)
Creatinine, Ser: 0.82 mg/dL (ref 0.61–1.24)
GFR, Estimated: >60 mL/min (ref >60)
Glucose, Bld: 94 mg/dL (ref 70–99)
Potassium: 4.1 mmol/L (ref 3.5–5.1)
Sodium: 135 mmol/L (ref 135–145)
Total Bilirubin: 0.6 mg/dL (ref 0.3–1.2)
Total Protein: 6.9 g/dL (ref 6.5–8.1)

## 2022-07-31 LAB — FERRITIN: Ferritin: 146 ng/mL (ref 24–336)

## 2022-07-31 LAB — TOTAL PROTEIN, URINE DIPSTICK: Protein, ur: NEGATIVE mg/dL

## 2022-07-31 MED ORDER — SODIUM CHLORIDE 0.9 % IV SOLN
5.0000 mg/kg | Freq: Once | INTRAVENOUS | Status: AC
  Administered 2022-07-31: 350 mg via INTRAVENOUS
  Filled 2022-07-31: qty 14

## 2022-07-31 MED ORDER — SODIUM CHLORIDE 0.9 % IV SOLN
400.0000 mg/m2 | Freq: Once | INTRAVENOUS | Status: AC
  Administered 2022-07-31: 740 mg via INTRAVENOUS
  Filled 2022-07-31: qty 37

## 2022-07-31 MED ORDER — ATROPINE SULFATE 1 MG/ML IV SOLN
0.5000 mg | Freq: Once | INTRAVENOUS | Status: AC | PRN
  Administered 2022-07-31: 0.5 mg via INTRAVENOUS
  Filled 2022-07-31: qty 1

## 2022-07-31 MED ORDER — SODIUM CHLORIDE 0.9 % IV SOLN
10.0000 mg | Freq: Once | INTRAVENOUS | Status: AC
  Administered 2022-07-31: 10 mg via INTRAVENOUS
  Filled 2022-07-31: qty 10

## 2022-07-31 MED ORDER — SODIUM CHLORIDE 0.9% FLUSH
10.0000 mL | Freq: Once | INTRAVENOUS | Status: AC
  Administered 2022-07-31: 10 mL

## 2022-07-31 MED ORDER — SODIUM CHLORIDE 0.9 % IV SOLN: Freq: Once | INTRAVENOUS | Status: AC

## 2022-07-31 MED ORDER — PALONOSETRON HCL INJECTION 0.25 MG/5ML
0.2500 mg | Freq: Once | INTRAVENOUS | Status: AC
  Administered 2022-07-31: 0.25 mg via INTRAVENOUS
  Filled 2022-07-31: qty 5

## 2022-07-31 MED ORDER — SODIUM CHLORIDE 0.9 % IV SOLN
2400.0000 mg/m2 | INTRAVENOUS | Status: DC
  Administered 2022-07-31: 4450 mg via INTRAVENOUS
  Filled 2022-07-31: qty 89

## 2022-07-31 MED ORDER — SODIUM CHLORIDE 0.9 % IV SOLN
160.0000 mg/m2 | Freq: Once | INTRAVENOUS | Status: AC
  Administered 2022-07-31: 300 mg via INTRAVENOUS
  Filled 2022-07-31: qty 15

## 2022-07-31 NOTE — Telephone Encounter (Signed)
Spoke with daughter to advise that per pharmacy, patient can contact [facility] to obtain Cymbalt for a reduced price of $51.

## 2022-08-02 ENCOUNTER — Other Ambulatory Visit (HOSPITAL_COMMUNITY): Payer: Self-pay

## 2022-08-02 ENCOUNTER — Other Ambulatory Visit: Payer: Self-pay

## 2022-08-02 ENCOUNTER — Encounter: Payer: Self-pay | Admitting: Hematology

## 2022-08-02 ENCOUNTER — Other Ambulatory Visit: Payer: Self-pay | Admitting: Hematology

## 2022-08-02 ENCOUNTER — Inpatient Hospital Stay: Payer: Medicare Other

## 2022-08-02 DIAGNOSIS — C7800 Secondary malignant neoplasm of unspecified lung: Secondary | ICD-10-CM

## 2022-08-02 DIAGNOSIS — Z5112 Encounter for antineoplastic immunotherapy: Secondary | ICD-10-CM | POA: Diagnosis not present

## 2022-08-02 MED ORDER — HEPARIN SOD (PORK) LOCK FLUSH 100 UNIT/ML IV SOLN
500.0000 [IU] | Freq: Once | INTRAVENOUS | Status: AC | PRN
  Administered 2022-08-02: 500 [IU]

## 2022-08-02 MED ORDER — SODIUM CHLORIDE 0.9% FLUSH
10.0000 mL | INTRAVENOUS | Status: DC | PRN
  Administered 2022-08-02: 10 mL

## 2022-08-02 MED ORDER — PEGFILGRASTIM-JMDB 6 MG/0.6ML ~~LOC~~ SOSY
6.0000 mg | PREFILLED_SYRINGE | Freq: Once | SUBCUTANEOUS | Status: AC
  Administered 2022-08-02: 6 mg via SUBCUTANEOUS
  Filled 2022-08-02: qty 0.6

## 2022-08-02 MED ORDER — AMLODIPINE BESYLATE 5 MG PO TABS
5.0000 mg | ORAL_TABLET | Freq: Every day | ORAL | 2 refills | Status: DC
  Filled 2022-08-02: qty 30, 30d supply, fill #0
  Filled 2022-08-31 – 2022-10-12 (×2): qty 30, 30d supply, fill #1
  Filled 2022-10-20: qty 30, 30d supply, fill #2

## 2022-08-02 NOTE — Assessment & Plan Note (Signed)
Jorge [name] is here today for follow-up and treatment of his metastatic rectal cancer.  He continues on treatment with FOLFIRI and bevacizumab with good tolerance.  His labs are stable.  He will proceed with his treatment today.  We discussed his hypertension which is being managed with amlodipine and likely secondary to the bevacizumab.  His pain is being managed well with the combination of gabapentin and oxycodone however does note that it still does bother him and he would like to get the Cymbalta filled.  I called over to [facility] and they are able to fill his Cymbalta for $15 instead of $80.  I have asked my nurse to reach out and contact the patient to see if he would like that prescription transferred.  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  [name] will return in 2 weeks for labs, follow-up, and his next treatment.

## 2022-08-02 NOTE — Progress Notes (Signed)
[place] Cancer Follow up:    Pcp, No No address on file   DIAGNOSIS:  Cancer Staging  Rectal cancer [facility]) Staging form: Colon and Rectum, AJCC 8th Edition - Clinical stage from 08/29/2021: [name], cM1 - Signed by [name], MD on 08/29/2021 Stage prefix: Initial diagnosis   SUMMARY OF ONCOLOGIC HISTORY: Oncology History Overview Note  Cancer Staging Rectal cancer [facility]) Staging form: Colon and Rectum, AJCC 8th Edition - Clinical stage from 08/29/2021: [name], cM1 - Signed by [name], MD on 08/29/2021    Rectal cancer ([place])  07/23/2021 Imaging   CT AP  IMPRESSION: 1. Appearance of the rectum likely indicates a rectal mass suspicious for rectal carcinoma. Inflammatory process would be less likely. Direct visualization is suggested. 2. Cholelithiasis without evidence of acute cholecystitis. 3. Aortic atherosclerosis.   08/19/2021 Procedure   Colonoscopy, under Dr. [name]  Impression: - Rectal tenderness, palpable rectal mass and hemorrhoids found on digital rectal exam. - Stool in the entire examined colon - lavaged with still inadequate clearance. - One 35 mm polyp at the hepatic flexure. Biopsied. Tattooed distal in case future endoscopic resection is considered - however, has larger issues with rectal mass currently as below. - Four, 5 to 11 mm polyps in the transverse colon and at the hepatic flexure, removed with a cold snare. Resected and retrieved. - Diverticulosis in the recto-sigmoid colon and in the sigmoid colon. - Rule out malignancy, partially obstructing tumor in the rectum and from 6 to 13 cm proximal to the anus. Biopsied.   08/19/2021 Pathology Results   Diagnosis 1. Hepatic Flexure Biopsy - TUBULAR ADENOMA WITHOUT HIGH-GRADE DYSPLASIA OR MALIGNANCY 2. Transverse Colon Biopsy, and hepatic flexure, polyps (4) - TUBULAR ADENOMA WITHOUT HIGH-GRADE DYSPLASIA OR MALIGNANCY - OTHER FRAGMENTS OF POLYPOID COLONIC MUCOSA WITH NO  SPECIFIC HISTOPATHOLOGIC CHANGES - FOOD MATERIAL 3. Rectum, biopsy - ADENOCARCINOMA. SEE NOTE   08/22/2021 Initial Diagnosis   Rectal cancer ([place])   08/26/2021 Imaging   IMPRESSION: 1. A 2.2 x 1.9 cm right middle lobe pulmonary nodule as well as a total of four right upper lobe pulmonary nodule and masses measuring up to 3.6 cm. Findings concerning for metastatic primary lung cancer versus less likely metastases in a patient with rectal cancer. Additional imaging evaluation or consultation with Pulmonology or Thoracic Surgery recommended. 2. No gross hilar adenopathy, noting limited sensitivity for the detection of hilar adenopathy on this noncontrast study. 3. Cholelithiasis. 4.  Emphysema (ICD10-J43.9). 5. At least left anterior descending coronary artery calcifications.   08/27/2021 Imaging   IMPRESSION: Rectal adenocarcinoma T stage: T4 B   Rectal adenocarcinoma N stage: N2 disease likely associated with early extra mesorectal lymph node involvement.   Distance from tumor to the internal anal sphincter is 1.2 cm.   Also with extramural venous involvement as described.   08/29/2021 Cancer Staging   Staging form: Colon and Rectum, AJCC 8th Edition - Clinical stage from 08/29/2021: [name], cM1 - Signed by [name], MD on 08/29/2021 Stage prefix: Initial diagnosis   01/23/2022 Imaging   CT CAP w contrast IMPRESSION: 1. Mild interval decrease in size of multiple right-sided pulmonary nodules. No new suspicious pulmonary nodule or mass. 2. Interval development of approximately 10 new ill-defined hypoattenuating liver lesions ranging in size from approximately 5-10 mm. Imaging features suspicious for metastatic disease. MRI abdomen with and without contrast may prove helpful to further evaluate. 3. Similar appearance of wall thickening in the rectum with perirectal edema. 4.  Cholelithiasis. 5. Prostatomegaly. 6. Aortic Atherosclerosis (ICD10-I70.0).   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 male returns for f/u of his rectal cancer.  He is doing moderately well today.  He tolerates Folfiri + Bevacizumab well.  He denies easy bruising bleeding or diarrhea.  He does have mild hypertension and needs his amlodipine refilled.  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  He unfortunately could not pick up his Cymbalta from [facility] because it was $79.  He wonders if there is a more affordable alternative.  Otherwise he is doing well and is ready to proceed with his treatment.   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  MEDICAL HISTORY: 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 ([place]) 08/19/2021    SURGICAL HISTORY: Past Surgical History:  Procedure Laterality Date   BRONCHIAL BIOPSY  09/27/2021   Procedure: BRONCHIAL BIOPSIES;  Surgeon: Garner Nash, DO;  Location: [place] [facility];  Service: Pulmonary;;   BRONCHIAL BRUSHINGS  09/27/2021   Procedure:  BRONCHIAL BRUSHINGS;  Surgeon: Garner Nash, DO;  Location: [place] [facility];  Service: Pulmonary;;   BRONCHIAL NEEDLE ASPIRATION BIOPSY  09/27/2021   Procedure: BRONCHIAL NEEDLE ASPIRATION BIOPSIES;  Surgeon: Garner Nash, DO;  Location: [place];  Service: Pulmonary;;   COLONOSCOPY     IR IMAGING GUIDED PORT INSERTION  10/24/2021   left breast surgery Left 1968   VIDEO BRONCHOSCOPY WITH RADIAL ENDOBRONCHIAL ULTRASOUND  09/27/2021   Procedure: RADIAL ENDOBRONCHIAL ULTRASOUND;  Surgeon: Garner Nash, DO;  Location: [facility];  Service: Pulmonary;;    SOCIAL HISTORY: Social History   Socioeconomic History   Marital status: Widowed    Spouse name: Not on file   Number of children: 5   Years of education: Not on file   Highest education level: Not on file  Occupational History   Not on file  Tobacco Use   Smoking status: Some Days    Packs/day: 0.25    Years: 20.00    Total pack years: 5.00    Types: Cigarettes   Smokeless tobacco: Never  Vaping Use   Vaping Use: Never used  Substance and Sexual Activity   Alcohol use: Not Currently    Comment: 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, stopped in 02/2021   Drug use: Never   Sexual activity: Not on file  Other Topics Concern   Not on file  Social History Narrative   Not on file   Social Determinants  of Health   Financial Resource Strain: Not on file  Food Insecurity: Not on file  Transportation Needs: Not on file  Physical Activity: Not on file  Stress: Not on file  Social Connections: Not on file  Intimate Partner Violence: Not on file    FAMILY HISTORY: Family History  Problem Relation Age of Onset   Cancer Mother 29       unknown type cancer   Colon cancer Neg Hx    Esophageal cancer Neg Hx    Rectal cancer Neg Hx    Stomach cancer Neg Hx    Inflammatory bowel disease Neg Hx    Liver disease Neg Hx    Pancreatic cancer Neg Hx     Review of Systems  Constitutional:  Negative for appetite  change, chills, fatigue, fever and unexpected weight change.  HENT:   Negative for hearing loss, lump/mass and trouble swallowing.   Eyes:  Negative for eye problems and icterus.  Respiratory:  Negative for chest tightness, cough and shortness of breath.   Cardiovascular:  Negative for chest pain, leg swelling and palpitations.  Gastrointestinal:  Positive for nausea. Negative for abdominal distention, abdominal pain, constipation, diarrhea and vomiting.  Endocrine: Negative for hot flashes.  Genitourinary:  Negative for difficulty urinating.   Musculoskeletal:  Negative for arthralgias.  Skin:  Negative for itching and rash.  Neurological:  Positive for numbness. Negative for dizziness, extremity weakness and headaches.  Hematological:  Negative for adenopathy. Does not bruise/bleed easily.  Psychiatric/Behavioral:  Negative for depression. The patient is not nervous/anxious.       PHYSICAL EXAMINATION  ECOG PERFORMANCE STATUS: 1 - Symptomatic but completely ambulatory  Vitals:   07/31/22 1100  BP: (!) 150/88  Pulse: 68  Resp: 18  Temp: 97.7 F (36.5 C)  SpO2: 97%    Physical Exam Constitutional:      General: He is not in acute distress.    Appearance: Normal appearance. He is not toxic-appearing.  HENT:     Head: Normocephalic and atraumatic.  Eyes:     General: No scleral icterus. Cardiovascular:     Rate and Rhythm: Normal rate and regular rhythm.     Pulses: Normal pulses.     Heart sounds: Normal heart sounds.  Pulmonary:     Effort: Pulmonary effort is normal.     Breath sounds: Normal breath sounds.  Abdominal:     General: Abdomen is flat. Bowel sounds are normal. There is no distension.     Palpations: Abdomen is soft.     Tenderness: There is no abdominal tenderness.  Musculoskeletal:        General: No swelling.     Cervical back: Neck supple.  Lymphadenopathy:     Cervical: No cervical adenopathy.  Skin:    General: Skin is warm and dry.      Findings: No rash.  Neurological:     General: No focal deficit present.     Mental Status: He is alert.  Psychiatric:        Mood and Affect: Mood normal.        Behavior: Behavior normal.     LABORATORY DATA:  CBC    Component Value Date/Time   WBC 5.4 07/31/2022 1034   RBC 4.66 07/31/2022 1034   HGB 14.0 07/31/2022 1034   HGB 14.1 07/13/2022 1014   HCT 42.4 07/31/2022 1034   PLT 144 (L) 07/31/2022 1034   PLT 182 07/13/2022 1014   MCV 91.0 07/31/2022 1034  MCH 30.0 07/31/2022 1034   MCHC 33.0 07/31/2022 1034   RDW 14.7 07/31/2022 1034   LYMPHSABS 0.9 07/31/2022 1034   MONOABS 0.9 07/31/2022 1034   EOSABS 0.2 07/31/2022 1034   BASOSABS 0.0 07/31/2022 1034    CMP     Component Value Date/Time   NA 135 07/31/2022 1034   K 4.1 07/31/2022 1034   CL 103 07/31/2022 1034   CO2 29 07/31/2022 1034   GLUCOSE 94 07/31/2022 1034   BUN 14 07/31/2022 1034   CREATININE 0.82 07/31/2022 1034   CREATININE 0.91 07/13/2022 1014   CALCIUM 8.8 (L) 07/31/2022 1034   PROT 6.9 07/31/2022 1034   ALBUMIN 3.7 07/31/2022 1034   AST 24 07/31/2022 1034   AST 25 07/13/2022 1014   ALT 17 07/31/2022 1034   ALT 17 07/13/2022 1014   ALKPHOS 135 (H) 07/31/2022 1034   BILITOT 0.6 07/31/2022 1034   BILITOT 0.3 07/13/2022 1014   GFRNONAA >60 07/31/2022 1034   GFRNONAA >60 07/13/2022 1014       ASSESSMENT and THERAPY PLAN:   Rectal cancer ([place]) Jorge [name] is here today for follow-up and treatment of his metastatic rectal cancer.  He continues on treatment with FOLFIRI and bevacizumab with good tolerance.  His labs are stable.  He will proceed with his treatment today.  We discussed his hypertension which is being managed with amlodipine and likely secondary to the bevacizumab.  His pain is being managed well with the combination of gabapentin and oxycodone however does note that it still does bother him and he would like to get the Cymbalta filled.  I called over to [facility] and they are able to fill his Cymbalta for $15 instead of $80.  I have asked my nurse to reach out and contact the patient to see if he would like that prescription transferred.  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  Orlandis will return in 2 weeks for labs, follow-up, and his next treatment.    All questions were answered. The patient knows to call the clinic with any problems, questions or concerns. We can certainly see the patient much sooner if necessary.  Total encounter time:30 minutes*in face-to-face visit time, chart review, lab review, care coordination, order entry, and documentation of the encounter time.    Wilber Bihari, NP 08/02/22 5:58 PM Medical Oncology and Hematology [facility] [place], [place] [address] Tel. [phone]    Fax. [phone]  *Total Encounter Time as defined by the Centers for Medicare and Medicaid Services includes, in addition to the face-to-face time of a patient visit (documented in the note above) non-face-to-face time: obtaining and reviewing outside history, ordering and reviewing medications, tests or procedures, care coordination (communications with other health care professionals or caregivers) and documentation in the medical record.

## 2022-08-02 NOTE — Patient Instructions (Signed)

## 2022-08-03 ENCOUNTER — Other Ambulatory Visit (HOSPITAL_COMMUNITY): Payer: Self-pay

## 2022-08-08 ENCOUNTER — Other Ambulatory Visit: Payer: Self-pay | Admitting: Nurse Practitioner

## 2022-08-08 DIAGNOSIS — G893 Neoplasm related pain (acute) (chronic): Secondary | ICD-10-CM

## 2022-08-08 DIAGNOSIS — Z515 Encounter for palliative care: Secondary | ICD-10-CM

## 2022-08-08 DIAGNOSIS — C20 Malignant neoplasm of rectum: Secondary | ICD-10-CM

## 2022-08-08 DIAGNOSIS — M792 Neuralgia and neuritis, unspecified: Secondary | ICD-10-CM

## 2022-08-08 MED ORDER — OXYCODONE HCL 5 MG PO TABS: 5.0000 mg | ORAL_TABLET | Freq: Three times a day (TID) | ORAL | 0 refills | Status: DC | PRN

## 2022-08-11 MED FILL — Dexamethasone Sodium Phosphate Inj 100 MG/10ML: INTRAMUSCULAR | Qty: 1 | Status: AC

## 2022-08-14 ENCOUNTER — Inpatient Hospital Stay (HOSPITAL_BASED_OUTPATIENT_CLINIC_OR_DEPARTMENT_OTHER): Payer: Medicare Other | Admitting: Physician Assistant

## 2022-08-14 ENCOUNTER — Inpatient Hospital Stay: Payer: Medicare Other | Attending: Physician Assistant

## 2022-08-14 ENCOUNTER — Other Ambulatory Visit: Payer: Self-pay

## 2022-08-14 ENCOUNTER — Inpatient Hospital Stay: Payer: Medicare Other

## 2022-08-14 DIAGNOSIS — Z5112 Encounter for antineoplastic immunotherapy: Secondary | ICD-10-CM | POA: Insufficient documentation

## 2022-08-14 DIAGNOSIS — C7800 Secondary malignant neoplasm of unspecified lung: Secondary | ICD-10-CM | POA: Diagnosis not present

## 2022-08-14 DIAGNOSIS — Z5111 Encounter for antineoplastic chemotherapy: Secondary | ICD-10-CM | POA: Insufficient documentation

## 2022-08-14 DIAGNOSIS — C20 Malignant neoplasm of rectum: Secondary | ICD-10-CM | POA: Diagnosis present

## 2022-08-14 DIAGNOSIS — Z5189 Encounter for other specified aftercare: Secondary | ICD-10-CM | POA: Insufficient documentation

## 2022-08-14 DIAGNOSIS — Z79899 Other long term (current) drug therapy: Secondary | ICD-10-CM | POA: Insufficient documentation

## 2022-08-14 DIAGNOSIS — C775 Secondary and unspecified malignant neoplasm of intrapelvic lymph nodes: Secondary | ICD-10-CM | POA: Diagnosis not present

## 2022-08-14 LAB — CBC WITH DIFFERENTIAL (CANCER CENTER ONLY)
Abs Immature Granulocytes: 0.04 10*3/uL (ref 0.00–0.07)
Basophils Absolute: 0 10*3/uL (ref 0.0–0.1)
Basophils Relative: 1 %
Eosinophils Absolute: 0.2 10*3/uL (ref 0.0–0.5)
Eosinophils Relative: 4 %
HCT: 43.4 % (ref 39.0–52.0)
Hemoglobin: 14.1 g/dL (ref 13.0–17.0)
Immature Granulocytes: 1 %
Lymphocytes Relative: 16 %
Lymphs Abs: 1 10*3/uL (ref 0.7–4.0)
MCH: 29.7 pg (ref 26.0–34.0)
MCHC: 32.5 g/dL (ref 30.0–36.0)
MCV: 91.4 fL (ref 80.0–100.0)
Monocytes Absolute: 0.8 10*3/uL (ref 0.1–1.0)
Monocytes Relative: 13 %
Neutro Abs: 4.2 10*3/uL (ref 1.7–7.7)
Neutrophils Relative %: 65 %
Platelet Count: 168 10*3/uL (ref 150–400)
RBC: 4.75 MIL/uL (ref 4.22–5.81)
RDW: 14.8 % (ref 11.5–15.5)
WBC Count: 6.4 10*3/uL (ref 4.0–10.5)
nRBC: 0 % (ref 0.0–0.2)

## 2022-08-14 LAB — CMP (CANCER CENTER ONLY)
ALT: 14 U/L (ref 0–44)
AST: 18 U/L (ref 15–41)
Albumin: 3.6 g/dL (ref 3.5–5.0)
Alkaline Phosphatase: 159 U/L — ABNORMAL HIGH (ref 38–126)
Anion gap: 2 — ABNORMAL LOW (ref 5–15)
BUN: 12 mg/dL (ref 8–23)
CO2: 30 mmol/L (ref 22–32)
Calcium: 8.7 mg/dL — ABNORMAL LOW (ref 8.9–10.3)
Chloride: 104 mmol/L (ref 98–111)
Creatinine: 0.86 mg/dL (ref 0.61–1.24)
GFR, Estimated: >60 mL/min (ref >60)
Glucose, Bld: 108 mg/dL — ABNORMAL HIGH (ref 70–99)
Potassium: 4.1 mmol/L (ref 3.5–5.1)
Sodium: 136 mmol/L (ref 135–145)
Total Bilirubin: 0.3 mg/dL (ref 0.3–1.2)
Total Protein: 6.9 g/dL (ref 6.5–8.1)

## 2022-08-14 LAB — CEA (IN HOUSE-CHCC): CEA (CHCC-In House): 20.4 ng/mL — ABNORMAL HIGH (ref 0.00–5.00)

## 2022-08-14 MED ORDER — PALONOSETRON HCL INJECTION 0.25 MG/5ML
0.2500 mg | Freq: Once | INTRAVENOUS | Status: AC
  Administered 2022-08-14: 0.25 mg via INTRAVENOUS
  Filled 2022-08-14: qty 5

## 2022-08-14 MED ORDER — ATROPINE SULFATE 1 MG/ML IV SOLN
0.5000 mg | Freq: Once | INTRAVENOUS | Status: AC | PRN
  Administered 2022-08-14: 0.5 mg via INTRAVENOUS
  Filled 2022-08-14: qty 1

## 2022-08-14 MED ORDER — SODIUM CHLORIDE 0.9 % IV SOLN: Freq: Once | INTRAVENOUS | Status: AC

## 2022-08-14 MED ORDER — SODIUM CHLORIDE 0.9% FLUSH: 10.0000 mL | INTRAVENOUS | Status: DC | PRN

## 2022-08-14 MED ORDER — SODIUM CHLORIDE 0.9 % IV SOLN
400.0000 mg/m2 | Freq: Once | INTRAVENOUS | Status: AC
  Administered 2022-08-14: 740 mg via INTRAVENOUS
  Filled 2022-08-14: qty 37

## 2022-08-14 MED ORDER — SODIUM CHLORIDE 0.9 % IV SOLN
2400.0000 mg/m2 | INTRAVENOUS | Status: DC
  Administered 2022-08-14: 4450 mg via INTRAVENOUS
  Filled 2022-08-14: qty 89

## 2022-08-14 MED ORDER — SODIUM CHLORIDE 0.9 % IV SOLN
5.0000 mg/kg | Freq: Once | INTRAVENOUS | Status: AC
  Administered 2022-08-14: 350 mg via INTRAVENOUS
  Filled 2022-08-14: qty 14

## 2022-08-14 MED ORDER — SODIUM CHLORIDE 0.9 % IV SOLN
160.0000 mg/m2 | Freq: Once | INTRAVENOUS | Status: AC
  Administered 2022-08-14: 300 mg via INTRAVENOUS
  Filled 2022-08-14: qty 15

## 2022-08-14 MED ORDER — SODIUM CHLORIDE 0.9 % IV SOLN
10.0000 mg | Freq: Once | INTRAVENOUS | Status: AC
  Administered 2022-08-14: 10 mg via INTRAVENOUS
  Filled 2022-08-14: qty 10

## 2022-08-14 NOTE — Progress Notes (Signed)
[place]   Telephone:([phone] Fax:([phone]   Clinic Follow up Note   Patient Care Team: Pcp, No as PCP - General Pickenpack-Cousar, [name], NP as Nurse Practitioner (Nurse Practitioner) [name], MD as Consulting Physician (Hematology) 08/14/2022  CHIEF COMPLAINT: f/u of metastatic rectal cancer  CURRENT THERAPY:  Second-line FOLFIRI, q2weeks, started 02/22/22 -Bevacizumab started 11/24/21   ASSESSMENT & PLAN:   1. 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 CBC and CMP are within parameters for treatment. -CEA in process, oncologist will discuss this further at next visit. --Due for restaging scan this month, order has been placed.  2. Symptom management: HTN, weight loss, neuropathy -Patient admits to compliance w/ low dose amlodipine (started 03/02/22). BP today 141/83. 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 Managed by palliative care. Next palliative appointment is 08/16/22.   SUMMARY OF ONCOLOGIC HISTORY: Oncology History Overview Note  Cancer Staging Rectal cancer [facility]) Staging form: Colon and Rectum, AJCC 8th Edition - Clinical stage from 08/29/2021: [name], cM1 - Signed by [name], MD on 08/29/2021    Rectal cancer ([place])  07/23/2021 Imaging   CT AP  IMPRESSION: 1. Appearance of the rectum likely indicates a rectal mass suspicious for rectal carcinoma. Inflammatory process would be less likely. Direct visualization is suggested. 2. Cholelithiasis without evidence of acute cholecystitis. 3. Aortic atherosclerosis.   08/19/2021 Procedure   Colonoscopy, under Dr. [name]  Impression: - Rectal tenderness, palpable rectal mass and hemorrhoids found on digital rectal exam. - Stool in the entire examined colon - lavaged with still inadequate  clearance. - One 35 mm polyp at the hepatic flexure. Biopsied. Tattooed distal in case future endoscopic resection is considered - however, has larger issues with rectal mass currently as below. - Four, 5 to 11 mm polyps in the transverse colon and at the hepatic flexure, removed with a cold snare. Resected and retrieved. - Diverticulosis in the recto-sigmoid colon and in the sigmoid colon. - Rule out malignancy, partially obstructing tumor in the rectum and from 6 to 13 cm proximal to the anus. Biopsied.   08/19/2021 Pathology Results   Diagnosis 1. Hepatic Flexure Biopsy - TUBULAR ADENOMA WITHOUT HIGH-GRADE DYSPLASIA OR MALIGNANCY 2. Transverse Colon Biopsy, and hepatic flexure, polyps (4) - TUBULAR ADENOMA WITHOUT HIGH-GRADE DYSPLASIA OR MALIGNANCY - OTHER FRAGMENTS OF POLYPOID COLONIC MUCOSA WITH NO SPECIFIC HISTOPATHOLOGIC CHANGES - FOOD MATERIAL 3. Rectum, biopsy - ADENOCARCINOMA. SEE NOTE   08/22/2021 Initial Diagnosis   Rectal cancer ([place])   08/26/2021 Imaging   IMPRESSION: 1. A 2.2 x 1.9 cm right middle lobe pulmonary nodule as well as a total of four right upper lobe pulmonary nodule and masses measuring up to 3.6 cm. Findings concerning for metastatic primary lung cancer versus less likely metastases in a patient with rectal cancer. Additional imaging evaluation or consultation with Pulmonology or Thoracic Surgery recommended. 2. No gross hilar adenopathy, noting limited sensitivity for the detection of hilar adenopathy on this noncontrast study. 3. Cholelithiasis. 4.  Emphysema (ICD10-J43.9). 5. At least left anterior descending coronary artery calcifications.   08/27/2021 Imaging   IMPRESSION: Rectal adenocarcinoma T stage: T4 B   Rectal adenocarcinoma N stage: N2 disease likely associated with early extra mesorectal lymph node involvement.   Distance from tumor to the internal anal sphincter is 1.2 cm.   Also with extramural venous  involvement as described.    08/29/2021 Cancer Staging   Staging form: Colon and Rectum, AJCC 8th Edition - Clinical stage from 08/29/2021: [name], cM1 - Signed by [name], MD on 08/29/2021 Stage prefix: Initial diagnosis   01/23/2022 Imaging   CT CAP w contrast IMPRESSION: 1. Mild interval decrease in size of multiple right-sided pulmonary nodules. No new suspicious pulmonary nodule or mass. 2. Interval development of approximately 10 new ill-defined hypoattenuating liver lesions ranging in size from approximately 5-10 mm. Imaging features suspicious for metastatic disease. MRI abdomen with and without contrast may prove helpful to further evaluate. 3. Similar appearance of wall thickening in the rectum with perirectal edema. 4. Cholelithiasis. 5. Prostatomegaly. 6. Aortic Atherosclerosis (ICD10-I70.0).   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[place])  10/06/2021 Initial Diagnosis   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[place])   10/14/2021 - 01/25/2022 Chemotherapy   Patient is on Treatment Plan : COLORECTAL [place] + Bevacizumab q21d     02/21/2022 - 06/29/2022 Chemotherapy   Patient is on Treatment Plan : COLORECTAL FOLFIRI / BEVACIZUMAB Q14D     02/21/2022 -  Chemotherapy   Patient is on Treatment Plan : COLORECTAL FOLFIRI + Bevacizumab q14d       INTERVAL HISTORY: Jorge [name] is here today for follow up of metastatic rectal cancer. His last appointment was  07/31/22 with NP Mendel Ryder. Patient reports today he is overall doing well. He has a good appetite and thinks is weight is "about the same" as last month. He denies being in pain currently. In regards to his neuropathy he feels it is under control when he is compliant with his medications- Gabapentin and Cymbalta with BID oxycodone. He is eager to finish treatment today so he can watch his great grand kids.      REVIEW OF SYSTEMS:   Constitutional: Denies fevers, chills or abnormal weight loss Eyes: Denies blurriness of vision Ears, nose, mouth,  throat, and face: Denies mucositis or sore throat Respiratory: Denies cough, dyspnea or wheezes Cardiovascular: Denies palpitation, chest discomfort or lower extremity swelling Gastrointestinal:  Denies nausea, heartburn or change in bowel habits Skin: Denies abnormal skin rashes Lymphatics: Denies new lymphadenopathy or easy bruising Neurological:Denies numbness, tingling or new weaknesses Behavioral/Psych: Mood is stable, no new changes  All other systems were reviewed with the patient and are negative.  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 ([place]) 08/19/2021    SURGICAL HISTORY: Past Surgical History:  Procedure Laterality Date   BRONCHIAL BIOPSY  09/27/2021   Procedure: BRONCHIAL BIOPSIES;  Surgeon: Garner Nash, DO;  Location: [facility];  Service: Pulmonary;;   BRONCHIAL BRUSHINGS  09/27/2021   Procedure: BRONCHIAL BRUSHINGS;  Surgeon: Garner Nash, DO;  Location: [facility];  Service: Pulmonary;;   BRONCHIAL NEEDLE ASPIRATION BIOPSY  09/27/2021   Procedure: BRONCHIAL NEEDLE ASPIRATION BIOPSIES;  Surgeon: Garner Nash, DO;  Location: [facility];  Service: Pulmonary;;   COLONOSCOPY     IR IMAGING GUIDED PORT INSERTION  10/24/2021   left breast surgery Left 1968   VIDEO BRONCHOSCOPY WITH RADIAL ENDOBRONCHIAL ULTRASOUND  09/27/2021   Procedure: RADIAL ENDOBRONCHIAL ULTRASOUND;  Surgeon: Garner Nash, DO;  Location: [facility];  Service: Pulmonary;;    I have reviewed the social history and family history with the patient and they are unchanged from previous note.  ALLERGIES:  has No Known Allergies.  MEDICATIONS:  Current Outpatient Medications  Medication Sig Dispense Refill   amLODipine (NORVASC) 5 MG tablet Take 1 tablet (5 mg total) by mouth daily. 30 tablet 2   carbonyl iron (FEOSOL) 45 MG TABS tablet Take 45 mg by mouth daily.     DULoxetine (CYMBALTA) 20  MG capsule Take 1 capsule (20 mg total) by mouth daily. 30 capsule 0   gabapentin (NEURONTIN) 100 MG capsule Take 2 capsules (200 mg total) by mouth 3 (three) times daily. 180 capsule 0   Multiple Vitamin (MULTIVITAMIN WITH MINERALS) TABS tablet Take 1 tablet by mouth daily.     ondansetron (ZOFRAN) 8 MG tablet Take 1 tablet (8 mg total) by mouth every 8 (eight) hours as needed for nausea or vomiting. 30 tablet 0   oxyCODONE (OXY IR/ROXICODONE) 5 MG immediate release tablet Take 1 tablet (5 mg total) by mouth every 8 (eight) hours as needed for severe pain or breakthrough pain. 45 tablet 0   No current facility-administered medications for this visit.   Facility-Administered Medications Ordered in Other Visits  Medication Dose Route Frequency Provider Last Rate Last Admin   atropine injection 0.5 mg  0.5 mg Intravenous Once PRN [name], MD       bevacizumab-bvzr (ZIRABEV) 350 mg in sodium chloride 0.9 % 100 mL chemo infusion  5 mg/kg (Treatment Plan Recorded) Intravenous Once [name], MD       dexamethasone (DECADRON) 10 mg in sodium chloride 0.9 % 50 mL IVPB  10 mg Intravenous Once [name], MD       fluorouracil (ADRUCIL) 4,450 mg in sodium chloride 0.9 % 61 mL chemo infusion  2,400 mg/m2 (Treatment Plan Recorded) Intravenous 1 day or 1 dose [name], MD       irinotecan (CAMPTOSAR) 300 mg in sodium chloride 0.9 % 500 mL chemo infusion  160 mg/m2 (Treatment Plan Recorded) Intravenous Once [name], MD       leucovorin 740 mg in sodium chloride 0.9 % 250 mL infusion  400 mg/m2 (Treatment Plan Recorded) Intravenous Once [name], MD       palonosetron (ALOXI) injection 0.25 mg  0.25 mg Intravenous Once [name], MD       sodium chloride flush (NS) 0.9 % injection 10 mL  10 mL Intracatheter PRN [name], MD        PHYSICAL EXAMINATION: ECOG PERFORMANCE STATUS: 1 - Symptomatic but completely ambulatory  Vitals:   08/14/22 1055  BP: (!) 141/83  Pulse: 68  Resp: 16  Temp: 98.4 F  (36.9 C)  SpO2: 99%     GENERAL:alert, no distress and comfortable. Thin appearing male. SKIN: skin color, texture, turgor are normal EYES: normal, sclera clear OROPHARYNX:no exudate, no erythema and lips, buccal mucosa, and tongue normal  NECK: supple LYMPH:  no palpable lymphadenopathy in the cervical, axillary or inguinal LUNGS: clear to auscultation  with normal breathing effort HEART: regular rate & rhythm and no murmurs and no lower extremity edema ABDOMEN:abdomen soft, non-tender  Musculoskeletal:no cyanosis of digits and no clubbing  NEURO: alert & oriented x 3 with fluent speech  LABORATORY DATA:  I have reviewed the data as listed    Latest Ref Rng & Units 08/14/2022   10:30 AM 07/31/2022   10:34 AM 07/13/2022   10:14 AM  CBC  WBC 4.0 - 10.5 K/uL 6.4  5.4  5.3   Hemoglobin 13.0 - 17.0 g/dL 14.1  14.0  14.1   Hematocrit 39.0 - 52.0 % 43.4  42.4  42.2  Platelets 150 - 400 K/uL 168  144  182         Latest Ref Rng & Units 08/14/2022   10:30 AM 07/31/2022   10:34 AM 07/13/2022   10:14 AM  CMP  Glucose 70 - 99 mg/dL 108  94  89   BUN 8 - 23 mg/dL '12  14  15   ' Creatinine 0.61 - 1.24 mg/dL 0.86  0.82  0.91   Sodium 135 - 145 mmol/L 136  135  136   Potassium 3.5 - 5.1 mmol/L 4.1  4.1  4.2   Chloride 98 - 111 mmol/L 104  103  104   CO2 22 - 32 mmol/L '30  29  29   ' Calcium 8.9 - 10.3 mg/dL 8.7  8.8  9.0   Total Protein 6.5 - 8.1 g/dL 6.9  6.9  6.9   Total Bilirubin 0.3 - 1.2 mg/dL 0.3  0.6  0.3   Alkaline Phos 38 - 126 U/L 159  135  167   AST 15 - 41 U/L '18  24  25   ' ALT 0 - 44 U/L '14  17  17       ' RADIOGRAPHIC STUDIES: I have personally reviewed the radiological images as listed and agreed with the findings in the report. No results found.   All questions were answered. The patient knows to call the clinic with any problems, questions or concerns. No barriers to learning was detected. Total encounter time:30 minutes in face-to-face visit time, chart review, lab  review, care coordination, order entry, and documentation of the encounter time.      Barrie Folk, PA-C 08/14/22

## 2022-08-14 NOTE — Patient Instructions (Signed)
[facility]  Discharge Instructions: Thank you for choosing [facility] to provide your oncology and hematology care.   If you have a lab appointment with the [facility], please go directly to the [facility] and check in at the registration area.   Wear comfortable clothing and clothing appropriate for easy access to any Portacath or PICC line.   We strive to give you quality time with your provider. You may need to reschedule your appointment if you arrive late (15 or more minutes).  Arriving late affects you and other patients whose appointments are after yours.  Also, if you miss three or more appointments without notifying the office, you may be dismissed from the clinic at the provider's discretion.      For prescription refill requests, have your pharmacy contact our office and allow 72 hours for refills to be completed.    Today you received the following chemotherapy and/or immunotherapy agents: Bevacizumab, Irinotecan, fluorouracil    To help prevent nausea and vomiting after your treatment, we encourage you to take your nausea medication as directed.  BELOW ARE SYMPTOMS THAT SHOULD BE REPORTED IMMEDIATELY: *FEVER GREATER THAN 100.4 F (38 C) OR HIGHER *CHILLS OR SWEATING *NAUSEA AND VOMITING THAT IS NOT CONTROLLED WITH YOUR NAUSEA MEDICATION *UNUSUAL SHORTNESS OF BREATH *UNUSUAL BRUISING OR BLEEDING *URINARY PROBLEMS (pain or burning when urinating, or frequent urination) *BOWEL PROBLEMS (unusual diarrhea, constipation, pain near the anus) TENDERNESS IN MOUTH AND THROAT WITH OR WITHOUT PRESENCE OF ULCERS (sore throat, sores in mouth, or a toothache) UNUSUAL RASH, SWELLING OR PAIN  UNUSUAL VAGINAL DISCHARGE OR ITCHING   Items with * indicate a potential emergency and should be followed up as soon as possible or go to the Emergency Department if any problems should occur.  Please show the CHEMOTHERAPY ALERT CARD or IMMUNOTHERAPY  ALERT CARD at check-in to the Emergency Department and triage nurse.  Should you have questions after your visit or need to cancel or reschedule your appointment, please contact [facility]  Dept: [phone]  and follow the prompts.  Office hours are 8:00 a.m. to 4:30 p.m. Monday - Friday. Please note that voicemails left after 4:00 p.m. may not be returned until the following business day.  We are closed weekends and major holidays. You have access to a nurse at all times for urgent questions. Please call the main number to the clinic Dept: [phone] and follow the prompts.   For any non-urgent questions, you may also contact your provider using MyChart. We now offer e-Visits for anyone 11 and older to request care online for non-urgent symptoms. For details visit [email or web address].   Also download the MyChart app! Go to the app store, search "MyChart", open the app, select [facility], and log in with your MyChart username and password.  Masks are optional in the cancer centers. If you would like for your care team to wear a mask while they are taking care of you, please let them know. You may have one support person who is at least 71 years old accompany you for your appointments.

## 2022-08-16 ENCOUNTER — Encounter: Payer: Self-pay | Admitting: Nurse Practitioner

## 2022-08-16 ENCOUNTER — Other Ambulatory Visit: Payer: Self-pay

## 2022-08-16 ENCOUNTER — Inpatient Hospital Stay: Payer: Medicare Other

## 2022-08-16 ENCOUNTER — Inpatient Hospital Stay (HOSPITAL_BASED_OUTPATIENT_CLINIC_OR_DEPARTMENT_OTHER): Payer: Medicare Other | Admitting: Nurse Practitioner

## 2022-08-16 VITALS — BP 156/76 | HR 68 | Temp 97.8°F | Resp 15 | Wt 148.2 lb

## 2022-08-16 VITALS — BP 159/82 | HR 61 | Temp 98.1°F | Resp 18

## 2022-08-16 DIAGNOSIS — Z515 Encounter for palliative care: Secondary | ICD-10-CM | POA: Diagnosis not present

## 2022-08-16 DIAGNOSIS — Z5112 Encounter for antineoplastic immunotherapy: Secondary | ICD-10-CM | POA: Diagnosis not present

## 2022-08-16 DIAGNOSIS — M792 Neuralgia and neuritis, unspecified: Secondary | ICD-10-CM

## 2022-08-16 DIAGNOSIS — C20 Malignant neoplasm of rectum: Secondary | ICD-10-CM

## 2022-08-16 DIAGNOSIS — C7800 Secondary malignant neoplasm of unspecified lung: Secondary | ICD-10-CM | POA: Diagnosis not present

## 2022-08-16 MED ORDER — SODIUM CHLORIDE 0.9% FLUSH
10.0000 mL | INTRAVENOUS | Status: DC | PRN
  Administered 2022-08-16: 10 mL

## 2022-08-16 MED ORDER — HEPARIN SOD (PORK) LOCK FLUSH 100 UNIT/ML IV SOLN
500.0000 [IU] | Freq: Once | INTRAVENOUS | Status: AC | PRN
  Administered 2022-08-16: 500 [IU]

## 2022-08-16 MED ORDER — PEGFILGRASTIM-JMDB 6 MG/0.6ML ~~LOC~~ SOSY
6.0000 mg | PREFILLED_SYRINGE | Freq: Once | SUBCUTANEOUS | Status: AC
  Administered 2022-08-16: 6 mg via SUBCUTANEOUS
  Filled 2022-08-16: qty 0.6

## 2022-08-16 NOTE — Progress Notes (Signed)
[facility]  [phone]) [phone] Fax:(336) [phone]   Name: Jorge [name] Date: 08/16/2022 MRN: [id number]  DOB: 1951/02/07  Jorge [name] Care Team: Pcp, No as PCP - General Pickenpack-Cousar, [name], NP as Nurse Practitioner (Nurse Practitioner) [name], MD as Consulting Physician (Hematology)   INTERVAL HISTORY: Jorge [name] is a 71 y.o. 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  Palliative ask to see for symptom management and goals of care.   SOCIAL HISTORY:     reports that he has been smoking cigarettes. He has a 5.00 pack-year smoking history. He has never used smokeless tobacco. He reports that he does not currently use alcohol. He reports that he does not use drugs.  ADVANCE DIRECTIVES:    CODE STATUS:   PAST MEDICAL HISTORY: 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 ([place]) 08/19/2021    ALLERGIES:  has No Known Allergies.  MEDICATIONS:  Current Outpatient Medications  Medication Sig Dispense Refill   amLODipine (NORVASC) 5 MG tablet Take 1 tablet (5 mg total) by mouth daily. 30 tablet 2   carbonyl iron (FEOSOL) 45 MG TABS tablet Take 45 mg by mouth daily.     DULoxetine (CYMBALTA) 20 MG capsule Take 1 capsule (20 mg total) by mouth daily. 30 capsule 0   gabapentin (NEURONTIN) 100 MG capsule Take 2 capsules (200 mg total) by mouth 3 (three) times daily. 180 capsule 0   Multiple Vitamin (MULTIVITAMIN WITH MINERALS) TABS tablet Take 1 tablet by mouth daily.     ondansetron (ZOFRAN) 8 MG tablet Take 1 tablet (8 mg total) by mouth every 8 (eight) hours as needed for nausea or vomiting. 30 tablet 0   oxyCODONE (OXY IR/ROXICODONE) 5 MG immediate release tablet Take 1 tablet (5 mg total) by mouth every 8 (eight) hours as needed for severe pain or breakthrough pain. 45 tablet 0   No current  facility-administered medications for this visit.    VITAL SIGNS: BP (!) 156/76 (BP Location: Left Arm, Jorge [name] Position: Sitting) Comment: Nurse was notify  Pulse 68   Temp 97.8 F (36.6 C) (Oral)   Resp 15   Wt 148 lb 3.2 oz (67.2 kg)   SpO2 98%   BMI 20.10 kg/m  Filed Weights   08/16/22 1315  Weight: 148 lb 3.2 oz (67.2 kg)    Estimated body mass index is 20.1 kg/m as calculated from the following:   Height as of 07/31/22: 6' (1.829 m).   Weight as of this encounter: 148 lb 3.2 oz (67.2 kg).   PERFORMANCE STATUS (ECOG) : 1 - Symptomatic but completely ambulatory  Assessment NAD, ambulatory RRR Normal breathing pattern  AAO x3, mood appropriate    IMPRESSION: Jorge [name] presents to clinic today for symptom management follow-up. No acute distress noted. Continues to feel well. States his neuropathic pain is much improved which he is appreciative of.   Neuropathic Pain  Jorge [name] shares his pain continues to improve. He wiggles his feet showing that they are not as numb and painful as before. Some days are better than others. He is tolerating cymbalta and gabapentin. Continues to use Oxy IR for severe breakthrough pain. Reports he may take oxycodone 1-2 times per day depending on level of discomfort. He is tolerating the increase in gabapentin. The "on fire" feeling has decreased.   Discussions regarding continuing his  current regimen with emphasis on taking gabapentin three times daily. He verbalized understanding and appreciation.     7/27: Jorge [name] endorses ongoing hands/feet burning and tingling.  He states when he gets up to walk he must stand for a few minutes and gain his bearings to allow his feet to catch it with his body and also be able to prepare himself for the discomfort.  He does have some difficulty with opening items due to discomfort in his hands.  He reports there is not anything that makes the numbness and tingling better other than medication.  Ambulating  long distances, standing for long periods of time, and utilizing his hands for certain activities escalates his discomfort.  Education provided on neuropathic pain and ways to effectively manage with a goal of hopefully improving symptoms.  He understands symptoms may not resolve but if we are able to decrease his discomfort this will be ideal for his quality of life.  We reviewed at length his current regimen: Gabapentin 200 mg twice daily and Oxy IR 5 mg every 6 hours as needed.  We discussed the use of both medications with understanding that for his neuropathic pain gabapentin is probably most effective.  He verbalized understanding and has had similar conversations with [name]. He reports he only uses his Oxy IR when pain is severe.  States he averages 1-3 tablets/day.  Most days he is taking 2 tablets.  He confirms he is taking gabapentin as prescribed.  He does notice some drowsiness at times however is able to remain active throughout the day.  Education provided on use of Cymbalta in addition to his gabapentin for ongoing neuropathic pain support.  He understands the goal will be to hopefully discontinue use of Oxy IR and allow these 2 medications to manage his symptoms.  He verbalized understanding and appreciation.  We discussed Her current illness and what it means in the larger context of Her on-going co-morbidities. Natural disease trajectory and expectations were discussed.  I discussed the importance of continued conversation with family and their medical providers regarding overall plan of care and treatment options, ensuring decisions are within the context of the patients values and GOCs.  PLAN:  Gabapentin '300mg'$  three times daily.   Cymbalta '20mg'$  daily. Tolerating well.  Oxy IR '5mg'$  every 8 hours as needed. Only requiring 1-2 times daily and some days not at all.  I will plan to see Jorge [name] back in 3-4 weeks in collaboration with his oncology appointments.   Jorge [name] expressed  understanding and was in agreement with this plan. He also understands that He can call the clinic at any time with any questions, concerns, or complaints.     Any controlled substances utilized were prescribed in the context of palliative care. PDMP has been reviewed.    Time Total: 20 min   Visit consisted of counseling and education dealing with the complex and emotionally intense issues of symptom management and palliative care in the setting of serious and potentially life-threatening illness.Greater than 50%  of this time was spent counseling and coordinating care related to the above assessment and plan.  [name], AGPCNP-BC  Palliative Medicine Team/[facility] [place]

## 2022-08-22 ENCOUNTER — Other Ambulatory Visit: Payer: Self-pay | Admitting: Nurse Practitioner

## 2022-08-22 DIAGNOSIS — C20 Malignant neoplasm of rectum: Secondary | ICD-10-CM

## 2022-08-22 DIAGNOSIS — G893 Neoplasm related pain (acute) (chronic): Secondary | ICD-10-CM

## 2022-08-22 DIAGNOSIS — M792 Neuralgia and neuritis, unspecified: Secondary | ICD-10-CM

## 2022-08-22 DIAGNOSIS — Z515 Encounter for palliative care: Secondary | ICD-10-CM

## 2022-08-22 MED ORDER — OXYCODONE HCL 5 MG PO TABS: 5.0000 mg | ORAL_TABLET | Freq: Three times a day (TID) | ORAL | 0 refills | Status: DC | PRN

## 2022-08-22 NOTE — Telephone Encounter (Signed)
From: [name] To: Jobe Gibbon, NP Sent: 08/22/2022 7:46 AM EDT Subject: Medication Renewal Request  Refills have been requested for the following medications:   oxyCODONE (OXY IR/ROXICODONE) 5 MG immediate release tablet Chesley Noon Pickenpack-Cousar]  [facility] pharmacy: [id number] [place] [address]: [employer]

## 2022-08-25 ENCOUNTER — Ambulatory Visit (HOSPITAL_COMMUNITY)
Admission: RE | Admit: 2022-08-25 | Discharge: 2022-08-25 | Disposition: A | Discharge status: Home / Self Care | Payer: Medicare Other | Source: Ambulatory Visit | Attending: Physician Assistant | Admitting: Physician Assistant

## 2022-08-25 DIAGNOSIS — C7800 Secondary malignant neoplasm of unspecified lung: Secondary | ICD-10-CM | POA: Diagnosis present

## 2022-08-25 DIAGNOSIS — C20 Malignant neoplasm of rectum: Secondary | ICD-10-CM | POA: Diagnosis not present

## 2022-08-25 MED ORDER — IOHEXOL 9 MG/ML PO SOLN
ORAL | Status: AC
  Filled 2022-08-25: qty 1000

## 2022-08-25 MED ORDER — HEPARIN SOD (PORK) LOCK FLUSH 100 UNIT/ML IV SOLN
INTRAVENOUS | Status: AC
  Administered 2022-08-25: 500 [IU]
  Filled 2022-08-25: qty 5

## 2022-08-25 MED ORDER — IOHEXOL 300 MG/ML  SOLN
100.0000 mL | Freq: Once | INTRAMUSCULAR | Status: AC | PRN
  Administered 2022-08-25: 100 mL via INTRAVENOUS

## 2022-08-25 MED ORDER — SODIUM CHLORIDE (PF) 0.9 % IJ SOLN
INTRAMUSCULAR | Status: AC
  Filled 2022-08-25: qty 50

## 2022-08-25 MED ORDER — IOHEXOL 9 MG/ML PO SOLN
1000.0000 mL | ORAL | Status: AC
  Administered 2022-08-25: 1000 mL via ORAL

## 2022-08-28 ENCOUNTER — Other Ambulatory Visit: Payer: Self-pay | Admitting: Nurse Practitioner

## 2022-08-28 MED ORDER — GABAPENTIN 100 MG PO CAPS: 200.0000 mg | ORAL_CAPSULE | Freq: Three times a day (TID) | ORAL | 1 refills | Status: DC

## 2022-08-28 MED ORDER — DULOXETINE HCL 20 MG PO CPEP: 20.0000 mg | ORAL_CAPSULE | Freq: Every day | ORAL | 1 refills | Status: DC

## 2022-08-29 ENCOUNTER — Other Ambulatory Visit: Payer: Self-pay

## 2022-08-29 ENCOUNTER — Encounter: Payer: Self-pay | Admitting: Hematology

## 2022-08-29 ENCOUNTER — Inpatient Hospital Stay: Payer: Medicare Other

## 2022-08-29 ENCOUNTER — Inpatient Hospital Stay (HOSPITAL_BASED_OUTPATIENT_CLINIC_OR_DEPARTMENT_OTHER): Payer: Medicare Other | Admitting: Hematology

## 2022-08-29 VITALS — BP 145/76 | HR 65 | Temp 98.3°F | Resp 16 | Ht 72.0 in | Wt 140.8 lb

## 2022-08-29 DIAGNOSIS — C7800 Secondary malignant neoplasm of unspecified lung: Secondary | ICD-10-CM | POA: Diagnosis not present

## 2022-08-29 DIAGNOSIS — C20 Malignant neoplasm of rectum: Secondary | ICD-10-CM

## 2022-08-29 DIAGNOSIS — Z5112 Encounter for antineoplastic immunotherapy: Secondary | ICD-10-CM | POA: Diagnosis not present

## 2022-08-29 DIAGNOSIS — I10 Essential (primary) hypertension: Secondary | ICD-10-CM | POA: Diagnosis not present

## 2022-08-29 LAB — CMP (CANCER CENTER ONLY)
ALT: 13 U/L (ref 0–44)
AST: 19 U/L (ref 15–41)
Albumin: 3.6 g/dL (ref 3.5–5.0)
Alkaline Phosphatase: 134 U/L — ABNORMAL HIGH (ref 38–126)
Anion gap: 3 — ABNORMAL LOW (ref 5–15)
BUN: 10 mg/dL (ref 8–23)
CO2: 29 mmol/L (ref 22–32)
Calcium: 8.9 mg/dL (ref 8.9–10.3)
Chloride: 104 mmol/L (ref 98–111)
Creatinine: 0.89 mg/dL (ref 0.61–1.24)
GFR, Estimated: >60 mL/min (ref >60)
Glucose, Bld: 90 mg/dL (ref 70–99)
Potassium: 4.5 mmol/L (ref 3.5–5.1)
Sodium: 136 mmol/L (ref 135–145)
Total Bilirubin: 0.5 mg/dL (ref 0.3–1.2)
Total Protein: 6.9 g/dL (ref 6.5–8.1)

## 2022-08-29 LAB — CBC WITH DIFFERENTIAL (CANCER CENTER ONLY)
Abs Immature Granulocytes: 0.01 10*3/uL (ref 0.00–0.07)
Basophils Absolute: 0.1 10*3/uL (ref 0.0–0.1)
Basophils Relative: 1 %
Eosinophils Absolute: 0.3 10*3/uL (ref 0.0–0.5)
Eosinophils Relative: 5 %
HCT: 43.8 % (ref 39.0–52.0)
Hemoglobin: 14.4 g/dL (ref 13.0–17.0)
Immature Granulocytes: 0 %
Lymphocytes Relative: 22 %
Lymphs Abs: 1.2 10*3/uL (ref 0.7–4.0)
MCH: 29.8 pg (ref 26.0–34.0)
MCHC: 32.9 g/dL (ref 30.0–36.0)
MCV: 90.5 fL (ref 80.0–100.0)
Monocytes Absolute: 1 10*3/uL (ref 0.1–1.0)
Monocytes Relative: 18 %
Neutro Abs: 2.9 10*3/uL (ref 1.7–7.7)
Neutrophils Relative %: 54 %
Platelet Count: 178 10*3/uL (ref 150–400)
RBC: 4.84 MIL/uL (ref 4.22–5.81)
RDW: 15 % (ref 11.5–15.5)
WBC Count: 5.4 10*3/uL (ref 4.0–10.5)
nRBC: 0 % (ref 0.0–0.2)

## 2022-08-29 MED ORDER — SODIUM CHLORIDE 0.9 % IV SOLN
5.0000 mg/kg | Freq: Once | INTRAVENOUS | Status: AC
  Administered 2022-08-29: 350 mg via INTRAVENOUS
  Filled 2022-08-29: qty 14

## 2022-08-29 MED ORDER — ATROPINE SULFATE 1 MG/ML IV SOLN
0.5000 mg | Freq: Once | INTRAVENOUS | Status: AC | PRN
  Administered 2022-08-29: 0.5 mg via INTRAVENOUS
  Filled 2022-08-29: qty 1

## 2022-08-29 MED ORDER — SODIUM CHLORIDE 0.9 % IV SOLN
160.0000 mg/m2 | Freq: Once | INTRAVENOUS | Status: AC
  Administered 2022-08-29: 300 mg via INTRAVENOUS
  Filled 2022-08-29: qty 15

## 2022-08-29 MED ORDER — PALONOSETRON HCL INJECTION 0.25 MG/5ML
0.2500 mg | Freq: Once | INTRAVENOUS | Status: AC
  Administered 2022-08-29: 0.25 mg via INTRAVENOUS
  Filled 2022-08-29: qty 5

## 2022-08-29 MED ORDER — SODIUM CHLORIDE 0.9 % IV SOLN
2400.0000 mg/m2 | INTRAVENOUS | Status: DC
  Administered 2022-08-29: 4450 mg via INTRAVENOUS
  Filled 2022-08-29: qty 89

## 2022-08-29 MED ORDER — SODIUM CHLORIDE 0.9 % IV SOLN
10.0000 mg | Freq: Once | INTRAVENOUS | Status: AC
  Administered 2022-08-29: 10 mg via INTRAVENOUS
  Filled 2022-08-29: qty 10

## 2022-08-29 MED ORDER — SODIUM CHLORIDE 0.9 % IV SOLN
400.0000 mg/m2 | Freq: Once | INTRAVENOUS | Status: AC
  Administered 2022-08-29: 740 mg via INTRAVENOUS
  Filled 2022-08-29: qty 25

## 2022-08-29 MED ORDER — SODIUM CHLORIDE 0.9 % IV SOLN: Freq: Once | INTRAVENOUS | Status: AC

## 2022-08-29 NOTE — Progress Notes (Signed)
Ok to treat with Urine protein from 07/31/22 per Dr Burr Medico

## 2022-08-29 NOTE — Patient Instructions (Signed)
[facility] MEDICAL ONCOLOGY  Discharge Instructions: Thank you for choosing [facility] to provide your oncology and hematology care.   If you have a lab appointment with the Cancer Center, please go directly to the Cancer Center and check in at the registration area.   Wear comfortable clothing and clothing appropriate for easy access to any Portacath or PICC line.   We strive to give you quality time with your provider. You may need to reschedule your appointment if you arrive late (15 or more minutes).  Arriving late affects you and other patients whose appointments are after yours.  Also, if you miss three or more appointments without notifying the office, you may be dismissed from the clinic at the provider's discretion.      For prescription refill requests, have your pharmacy contact our office and allow 72 hours for refills to be completed.    Today you received the following chemotherapy and/or immunotherapy agents: bevacizumab-bvzr, irinotecan, leucovorin, fluorouracil      To help prevent nausea and vomiting after your treatment, we encourage you to take your nausea medication as directed.  BELOW ARE SYMPTOMS THAT SHOULD BE REPORTED IMMEDIATELY: *FEVER GREATER THAN 100.4 F (38 C) OR HIGHER *CHILLS OR SWEATING *NAUSEA AND VOMITING THAT IS NOT CONTROLLED WITH YOUR NAUSEA MEDICATION *UNUSUAL SHORTNESS OF BREATH *UNUSUAL BRUISING OR BLEEDING *URINARY PROBLEMS (pain or burning when urinating, or frequent urination) *BOWEL PROBLEMS (unusual diarrhea, constipation, pain near the anus) TENDERNESS IN MOUTH AND THROAT WITH OR WITHOUT PRESENCE OF ULCERS (sore throat, sores in mouth, or a toothache) UNUSUAL RASH, SWELLING OR PAIN  UNUSUAL VAGINAL DISCHARGE OR ITCHING   Items with * indicate a potential emergency and should be followed up as soon as possible or go to the Emergency Department if any problems should occur.  Please show the CHEMOTHERAPY ALERT  CARD or IMMUNOTHERAPY ALERT CARD at check-in to the Emergency Department and triage nurse.  Should you have questions after your visit or need to cancel or reschedule your appointment, please contact [facility]  Dept: [phone]  and follow the prompts.  Office hours are 8:00 a.m. to 4:30 p.m. Monday - Friday. Please note that voicemails left after 4:00 p.m. may not be returned until the following business day.  We are closed weekends and major holidays. You have access to a nurse at all times for urgent questions. Please call the main number to the clinic Dept: [phone] and follow the prompts.   For any non-urgent questions, you may also contact your provider using MyChart. We now offer e-Visits for anyone 18 and older to request care online for non-urgent symptoms. For details visit [email or web address].   Also download the MyChart app! Go to the app store, search "MyChart", open the app, select [facility], and log in with your MyChart username and password.  Masks are optional in the cancer centers. If you would like for your care team to wear a mask while they are taking care of you, please let them know. You may have one support person who is at least 71 years old accompany you for your appointments. 

## 2022-08-29 NOTE — Progress Notes (Signed)
[facility]   [phone] [phone]) [phone]   Clinic Follow up Note   Patient Care Team: Pcp, No as PCP - General Pickenpack-Cousar, Jorge [name] Sax, NP as Nurse Practitioner (Nurse Practitioner) Jorge [name] Merle, MD as Consulting Physician (Hematology)  Date of Service:  08/29/2022  CHIEF COMPLAINT: f/u of metastatic rectal cancer  CURRENT THERAPY:  Second-line FOLFIRI, q2weeks, started 02/22/22 -Bevacizumab started 11/24/21  ASSESSMENT & PLAN:  Jorge [name] [name] is a 71 y.o. male with   1. 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 CT AP 07/23/21 showed rectal mass with no significant adenopathy or distant metastasis. Colonoscopy 08/19/21 showed a 7 cm infiltrative mass in the mid rectum (6-13cm from anal verge), pathology showed adenocarcinoma. MMR is normal -staging pelvic MRI 08/27/21 showed: staging at T4b; N2 likely associated with early extra mesorectal lymph node involvement; distance from tumor to internal anal sphincter is 1.2 cm; also with extramural venous involvement. He has biopsy proven lung mets -baseline CEA 571.84 on 09/06/21. -he received concurrent chemoRT with Xeloda 09/06/21 - 09/26/21, followed by first-line CAPOX and bevacizumab on 10/14/21. Xeloda dose reduced due to elevated liver enzymes. -restaging CT CAP on 01/23/22 showed: decrease in size of pulmonary nodules; approximately 10 new liver lesions. Abdomen MRI on 02/10/22 confirmed new liver lesions. -we switched to second-line FOLFIRI, with continued beva, on 02/21/22, with dose-reduced irinotecan for cycle 1. He tolerates well overall. -restaging CT CAP 08/25/22 showed stable to slightly improved disease. I reviewed the results with him today. -He is clinically stable. He reports his neuropathy is improving/better under control. Lab reviewed, CBC is WNL, CMP is improved. Adequate for treatment, will  continue current chemotherapy.   2. Symptom Management: HTN, Weight loss, Neuropathy -elevated BP secondary to beva; we started him on low-dose amlodipine on 03/02/22. He is compliant and endorses taking daily. -he has lost some weight again recently, down to 140 lbs today (08/29/22).  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 He is on gabapentin and cymbalta, taking oxycodone once a day. He is followed by NP Jorge [name] [name].   3. Multiple colon polyps -He has a large 3.5 cm polyps in the hepatic flexure, biopsy showed tubular adenoma. In the context of his stage IV rectal cancer, he prefers to defer resection for now, which we agree -He had additional 4 small polyps which were removed, path showed tubular adenomas.     PLAN: -proceed with C14 FOLFIRI/beva today at same dose              -Fulphila injection with pump d/c on day 3 -lab, flush, f/u, and FOLFIRI/Beva every 2 weeks  -palliative care f/u in 2 weeks   No problem-specific Assessment & Plan notes found for this encounter.   SUMMARY OF ONCOLOGIC HISTORY: Oncology History Overview Note  Cancer Staging Rectal cancer [facility]) Staging form: Colon and Rectum, AJCC 8th Edition - Clinical stage from 08/29/2021: [name], cM1 - Signed by Jorge [name] Merle, MD on 08/29/2021    Rectal cancer ([place])  07/23/2021 Imaging   CT AP  IMPRESSION: 1. Appearance of the rectum likely indicates a rectal mass suspicious for rectal carcinoma. Inflammatory process would be less likely. Direct visualization is suggested. 2. Cholelithiasis without evidence of acute cholecystitis. 3. Aortic atherosclerosis.   08/19/2021 Procedure   Colonoscopy, under Dr. Rush [name]  Impression: - Rectal tenderness, palpable rectal mass and hemorrhoids found on digital rectal exam. - Stool in the entire examined colon - lavaged with  still inadequate clearance. - One 35 mm polyp at the hepatic flexure. Biopsied. Tattooed distal in case future endoscopic resection is considered - however,  has larger issues with rectal mass currently as below. - Four, 5 to 11 mm polyps in the transverse colon and at the hepatic flexure, removed with a cold snare. Resected and retrieved. - Diverticulosis in the recto-sigmoid colon and in the sigmoid colon. - Rule out malignancy, partially obstructing tumor in the rectum and from 6 to 13 cm proximal to the anus. Biopsied.   08/19/2021 Pathology Results   Diagnosis 1. Hepatic Flexure Biopsy - TUBULAR ADENOMA WITHOUT HIGH-GRADE DYSPLASIA OR MALIGNANCY 2. Transverse Colon Biopsy, and hepatic flexure, polyps (4) - TUBULAR ADENOMA WITHOUT HIGH-GRADE DYSPLASIA OR MALIGNANCY - OTHER FRAGMENTS OF POLYPOID COLONIC MUCOSA WITH NO SPECIFIC HISTOPATHOLOGIC CHANGES - FOOD MATERIAL 3. Rectum, biopsy - ADENOCARCINOMA. SEE NOTE   08/22/2021 Initial Diagnosis   Rectal cancer ([place])   08/26/2021 Imaging   IMPRESSION: 1. A 2.2 x 1.9 cm right middle lobe pulmonary nodule as well as a total of four right upper lobe pulmonary nodule and masses measuring up to 3.6 cm. Findings concerning for metastatic primary lung cancer versus less likely metastases in a patient with rectal cancer. Additional imaging evaluation or consultation with Pulmonology or Thoracic Surgery recommended. 2. No gross hilar adenopathy, noting limited sensitivity for the detection of hilar adenopathy on this noncontrast study. 3. Cholelithiasis. 4.  Emphysema (ICD10-J43.9). 5. At least left anterior descending coronary artery calcifications.   08/27/2021 Imaging   IMPRESSION: Rectal adenocarcinoma T stage: T4 B   Rectal adenocarcinoma N stage: N2 disease likely associated with early extra mesorectal lymph node involvement.   Distance from tumor to the internal anal sphincter is 1.2 cm.   Also with extramural venous involvement as described.   08/29/2021 Cancer Staging   Staging form: Colon and Rectum, AJCC 8th Edition - Clinical stage from 08/29/2021: [name], cM1 - Signed by  Jorge [name] Merle, MD on 08/29/2021 Stage prefix: Initial diagnosis   01/23/2022 Imaging   CT CAP w contrast IMPRESSION: 1. Mild interval decrease in size of multiple right-sided pulmonary nodules. No new suspicious pulmonary nodule or mass. 2. Interval development of approximately 10 new ill-defined hypoattenuating liver lesions ranging in size from approximately 5-10 mm. Imaging features suspicious for metastatic disease. MRI abdomen with and without contrast may prove helpful to further evaluate. 3. Similar appearance of wall thickening in the rectum with perirectal edema. 4. Cholelithiasis. 5. Prostatomegaly. 6. Aortic Atherosclerosis (ICD10-I70.0).   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[place])  10/06/2021 Initial Diagnosis   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[place])   10/14/2021 - 01/25/2022 Chemotherapy   Patient is on Treatment Plan : COLORECTAL CapeOx + Bevacizumab q21d     02/21/2022 - 06/29/2022 Chemotherapy   Patient is on Treatment Plan : COLORECTAL FOLFIRI / BEVACIZUMAB Q14D     02/21/2022 -  Chemotherapy   Patient is on Treatment Plan : COLORECTAL FOLFIRI + Bevacizumab q14d        INTERVAL HISTORY:  Jorge [name] [name] is here for a follow up of metastatic rectal cancer. He was last seen by NP Mendel Ryder on 08/02/22, with visits in palliative care and [facility]. He presents to the clinic alone. He reports persistent neuropathy in two fingers on his right hand and tingling in his feet. He tells me about caring for his great-grandchildren. He seems to enjoy it. He notes he still smokes occasionally, when he can get time away from the children.  All other systems were reviewed with the patient and are negative.  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 ([place]) 08/19/2021    SURGICAL HISTORY: Past Surgical History:  Procedure Laterality Date   BRONCHIAL BIOPSY  09/27/2021   Procedure: BRONCHIAL  BIOPSIES;  Surgeon: Garner Nash, DO;  Location: [facility];  Service: Pulmonary;;   BRONCHIAL BRUSHINGS  09/27/2021   Procedure: BRONCHIAL BRUSHINGS;  Surgeon: Garner Nash, DO;  Location: [facility];  Service: Pulmonary;;   BRONCHIAL NEEDLE ASPIRATION BIOPSY  09/27/2021   Procedure: BRONCHIAL NEEDLE ASPIRATION BIOPSIES;  Surgeon: Garner Nash, DO;  Location: [facility];  Service: Pulmonary;;   COLONOSCOPY     IR IMAGING GUIDED PORT INSERTION  10/24/2021   left breast surgery Left 1968   VIDEO BRONCHOSCOPY WITH RADIAL ENDOBRONCHIAL ULTRASOUND  09/27/2021   Procedure: RADIAL ENDOBRONCHIAL ULTRASOUND;  Surgeon: Garner Nash, DO;  Location: [facility];  Service: Pulmonary;;    I have reviewed the social history and family history with the patient and they are unchanged from previous note.  ALLERGIES:  has No Known Allergies.  MEDICATIONS:  Current Outpatient Medications  Medication Sig Dispense Refill   amLODipine (NORVASC) 5 MG tablet Take 1 tablet (5 mg total) by mouth daily. 30 tablet 2   carbonyl iron (FEOSOL) 45 MG TABS tablet Take 45 mg by mouth daily.     DULoxetine (CYMBALTA) 20 MG capsule Take 1 capsule (20 mg total) by mouth daily. 30 capsule 1   gabapentin (NEURONTIN) 100 MG capsule Take 2 capsules (200 mg total) by mouth 3 (three) times daily. 180 capsule 1   Multiple Vitamin (MULTIVITAMIN WITH MINERALS) TABS tablet Take 1 tablet by mouth daily.     ondansetron (ZOFRAN) 8 MG tablet Take 1 tablet (8 mg total) by mouth every 8 (eight) hours as needed for nausea or vomiting. 30 tablet 0   oxyCODONE (OXY IR/ROXICODONE) 5 MG immediate release tablet Take 1 tablet (5 mg total) by mouth every 8 (eight) hours as needed for severe pain or breakthrough pain. 45 tablet 0   No current facility-administered medications for this visit.   Facility-Administered Medications Ordered in Other Visits  Medication Dose Route Frequency Provider Last Rate Last Admin    fluorouracil (ADRUCIL) 4,450 mg in sodium chloride 0.9 % 61 mL chemo infusion  2,400 mg/m2 (Treatment Plan Recorded) Intravenous 1 day or 1 dose Jorge [name] Merle, MD   Infusion Verify at 08/29/22 1434    PHYSICAL EXAMINATION: ECOG PERFORMANCE STATUS: 1 - Symptomatic but completely ambulatory  Vitals:   08/29/22 1059  BP: (!) 145/76  Pulse: 65  Resp: 16  Temp: 98.3 F (36.8 C)  SpO2: 99%   Wt Readings from Last 3 Encounters:  08/29/22 140 lb 12.8 oz (63.9 kg)  08/16/22 148 lb 3.2 oz (67.2 kg)  08/14/22 145 lb 9.6 oz (66 kg)     GENERAL:alert, no distress and comfortable SKIN: skin color normal, no rashes or significant lesions EYES: normal, Conjunctiva are pink and non-injected, sclera clear  NEURO: alert & oriented x 3 with fluent speech  LABORATORY DATA:  I have reviewed the data as listed    Latest Ref Rng & Units 08/29/2022   10:11 AM 08/14/2022   10:30 AM 07/31/2022   10:34 AM  CBC  WBC 4.0 - 10.5 K/uL 5.4  6.4  5.4   Hemoglobin 13.0 - 17.0 g/dL 14.4  14.1  14.0   Hematocrit 39.0 - 52.0 % 43.8  43.4  42.4   Platelets 150 - 400 K/uL 178  168  144         Latest Ref Rng & Units 08/29/2022   10:11 AM 08/14/2022   10:30 AM 07/31/2022   10:34 AM  CMP  Glucose 70 - 99 mg/dL 90  108  94   BUN 8 - 23 mg/dL '10  12  14   ' Creatinine 0.61 - 1.24 mg/dL 0.89  0.86  0.82   Sodium 135 - 145 mmol/L 136  136  135   Potassium 3.5 - 5.1 mmol/L 4.5  4.1  4.1   Chloride 98 - 111 mmol/L 104  104  103   CO2 22 - 32 mmol/L '29  30  29   ' Calcium 8.9 - 10.3 mg/dL 8.9  8.7  8.8   Total Protein 6.5 - 8.1 g/dL 6.9  6.9  6.9   Total Bilirubin 0.3 - 1.2 mg/dL 0.5  0.3  0.6   Alkaline Phos 38 - 126 U/L 134  159  135   AST 15 - 41 U/L '19  18  24   ' ALT 0 - 44 U/L '13  14  17       ' RADIOGRAPHIC STUDIES: I have personally reviewed the radiological images as listed and agreed with the findings in the report. No results found.    Orders Placed This Encounter  Procedures   CBC with Differential  ([facility] Only)    Standing Status:   Future    Standing Expiration Date:   10/05/2023   CMP ([facility] only)    Standing Status:   Future    Standing Expiration Date:   10/05/2023   Total Protein, Urine dipstick    Standing Status:   Future    Standing Expiration Date:   10/05/2023   All questions were answered. The patient knows to call the clinic with any problems, questions or concerns. No barriers to learning was detected. The total time spent in the appointment was 30 minutes.     Jorge [name] Merle, MD 08/29/2022   I, Wilburn Mylar, am acting as scribe for Jorge [name] Merle, MD.   I have reviewed the above documentation for accuracy and completeness, and I agree with the above.

## 2022-08-31 ENCOUNTER — Other Ambulatory Visit (HOSPITAL_COMMUNITY): Payer: Self-pay

## 2022-08-31 ENCOUNTER — Other Ambulatory Visit: Payer: Self-pay | Admitting: Nurse Practitioner

## 2022-08-31 ENCOUNTER — Inpatient Hospital Stay: Payer: Medicare Other

## 2022-08-31 ENCOUNTER — Encounter: Payer: Self-pay | Admitting: Hematology

## 2022-08-31 VITALS — BP 147/95 | HR 83 | Temp 98.6°F | Resp 16

## 2022-08-31 DIAGNOSIS — Z5112 Encounter for antineoplastic immunotherapy: Secondary | ICD-10-CM | POA: Diagnosis not present

## 2022-08-31 DIAGNOSIS — C20 Malignant neoplasm of rectum: Secondary | ICD-10-CM

## 2022-08-31 DIAGNOSIS — M792 Neuralgia and neuritis, unspecified: Secondary | ICD-10-CM

## 2022-08-31 DIAGNOSIS — Z515 Encounter for palliative care: Secondary | ICD-10-CM

## 2022-08-31 DIAGNOSIS — C7800 Secondary malignant neoplasm of unspecified lung: Secondary | ICD-10-CM

## 2022-08-31 DIAGNOSIS — G893 Neoplasm related pain (acute) (chronic): Secondary | ICD-10-CM

## 2022-08-31 MED ORDER — SODIUM CHLORIDE 0.9% FLUSH
10.0000 mL | INTRAVENOUS | Status: DC | PRN
  Administered 2022-08-31: 10 mL

## 2022-08-31 MED ORDER — PEGFILGRASTIM-JMDB 6 MG/0.6ML ~~LOC~~ SOSY
6.0000 mg | PREFILLED_SYRINGE | Freq: Once | SUBCUTANEOUS | Status: AC
  Administered 2022-08-31: 6 mg via SUBCUTANEOUS
  Filled 2022-08-31: qty 0.6

## 2022-08-31 MED ORDER — OXYCODONE HCL 5 MG PO TABS: 5.0000 mg | ORAL_TABLET | Freq: Three times a day (TID) | ORAL | 0 refills | Status: DC | PRN

## 2022-08-31 MED ORDER — HEPARIN SOD (PORK) LOCK FLUSH 100 UNIT/ML IV SOLN
500.0000 [IU] | Freq: Once | INTRAVENOUS | Status: AC | PRN
  Administered 2022-08-31: 500 [IU]

## 2022-09-11 ENCOUNTER — Other Ambulatory Visit: Payer: Medicare Other

## 2022-09-11 ENCOUNTER — Ambulatory Visit: Payer: Medicare Other

## 2022-09-11 ENCOUNTER — Ambulatory Visit: Payer: Medicare Other | Admitting: Hematology

## 2022-09-13 ENCOUNTER — Inpatient Hospital Stay: Payer: Medicare Other | Admitting: Nurse Practitioner

## 2022-09-13 MED FILL — Dexamethasone Sodium Phosphate Inj 100 MG/10ML: INTRAMUSCULAR | Qty: 1 | Status: AC

## 2022-09-14 ENCOUNTER — Other Ambulatory Visit: Payer: Self-pay

## 2022-09-14 ENCOUNTER — Other Ambulatory Visit: Payer: Self-pay | Admitting: Nurse Practitioner

## 2022-09-14 ENCOUNTER — Inpatient Hospital Stay (HOSPITAL_BASED_OUTPATIENT_CLINIC_OR_DEPARTMENT_OTHER): Payer: Medicare Other | Admitting: Hematology

## 2022-09-14 ENCOUNTER — Inpatient Hospital Stay: Payer: Medicare Other | Attending: Physician Assistant

## 2022-09-14 ENCOUNTER — Inpatient Hospital Stay: Payer: Medicare Other

## 2022-09-14 ENCOUNTER — Encounter: Payer: Self-pay | Admitting: Hematology

## 2022-09-14 VITALS — BP 152/80 | HR 62 | Temp 98.3°F | Resp 16 | Ht 72.0 in | Wt 149.8 lb

## 2022-09-14 DIAGNOSIS — C7800 Secondary malignant neoplasm of unspecified lung: Secondary | ICD-10-CM

## 2022-09-14 DIAGNOSIS — M792 Neuralgia and neuritis, unspecified: Secondary | ICD-10-CM

## 2022-09-14 DIAGNOSIS — C775 Secondary and unspecified malignant neoplasm of intrapelvic lymph nodes: Secondary | ICD-10-CM | POA: Diagnosis not present

## 2022-09-14 DIAGNOSIS — C20 Malignant neoplasm of rectum: Secondary | ICD-10-CM | POA: Diagnosis not present

## 2022-09-14 DIAGNOSIS — Z79899 Other long term (current) drug therapy: Secondary | ICD-10-CM | POA: Diagnosis not present

## 2022-09-14 DIAGNOSIS — Z5111 Encounter for antineoplastic chemotherapy: Secondary | ICD-10-CM | POA: Insufficient documentation

## 2022-09-14 DIAGNOSIS — Z5112 Encounter for antineoplastic immunotherapy: Secondary | ICD-10-CM | POA: Insufficient documentation

## 2022-09-14 DIAGNOSIS — G893 Neoplasm related pain (acute) (chronic): Secondary | ICD-10-CM

## 2022-09-14 DIAGNOSIS — Z95828 Presence of other vascular implants and grafts: Secondary | ICD-10-CM

## 2022-09-14 DIAGNOSIS — Z515 Encounter for palliative care: Secondary | ICD-10-CM

## 2022-09-14 LAB — CBC WITH DIFFERENTIAL (CANCER CENTER ONLY)
Abs Immature Granulocytes: 0.02 10*3/uL (ref 0.00–0.07)
Basophils Absolute: 0 10*3/uL (ref 0.0–0.1)
Basophils Relative: 1 %
Eosinophils Absolute: 0.3 10*3/uL (ref 0.0–0.5)
Eosinophils Relative: 5 %
HCT: 41.1 % (ref 39.0–52.0)
Hemoglobin: 13.3 g/dL (ref 13.0–17.0)
Immature Granulocytes: 0 %
Lymphocytes Relative: 17 %
Lymphs Abs: 0.8 10*3/uL (ref 0.7–4.0)
MCH: 29.6 pg (ref 26.0–34.0)
MCHC: 32.4 g/dL (ref 30.0–36.0)
MCV: 91.3 fL (ref 80.0–100.0)
Monocytes Absolute: 0.8 10*3/uL (ref 0.1–1.0)
Monocytes Relative: 15 %
Neutro Abs: 3.2 10*3/uL (ref 1.7–7.7)
Neutrophils Relative %: 62 %
Platelet Count: 162 10*3/uL (ref 150–400)
RBC: 4.5 MIL/uL (ref 4.22–5.81)
RDW: 15.5 % (ref 11.5–15.5)
WBC Count: 5.1 10*3/uL (ref 4.0–10.5)
nRBC: 0 % (ref 0.0–0.2)

## 2022-09-14 LAB — CMP (CANCER CENTER ONLY)
ALT: 15 U/L (ref 0–44)
AST: 19 U/L (ref 15–41)
Albumin: 3.4 g/dL — ABNORMAL LOW (ref 3.5–5.0)
Alkaline Phosphatase: 164 U/L — ABNORMAL HIGH (ref 38–126)
Anion gap: 3 — ABNORMAL LOW (ref 5–15)
BUN: 8 mg/dL (ref 8–23)
CO2: 28 mmol/L (ref 22–32)
Calcium: 8.7 mg/dL — ABNORMAL LOW (ref 8.9–10.3)
Chloride: 108 mmol/L (ref 98–111)
Creatinine: 0.82 mg/dL (ref 0.61–1.24)
GFR, Estimated: >60 mL/min (ref >60)
Glucose, Bld: 120 mg/dL — ABNORMAL HIGH (ref 70–99)
Potassium: 4.4 mmol/L (ref 3.5–5.1)
Sodium: 139 mmol/L (ref 135–145)
Total Bilirubin: 0.3 mg/dL (ref 0.3–1.2)
Total Protein: 6.5 g/dL (ref 6.5–8.1)

## 2022-09-14 LAB — CEA (IN HOUSE-CHCC): CEA (CHCC-In House): 22.46 ng/mL — ABNORMAL HIGH (ref 0.00–5.00)

## 2022-09-14 LAB — TOTAL PROTEIN, URINE DIPSTICK: Protein, ur: NEGATIVE mg/dL

## 2022-09-14 MED ORDER — SODIUM CHLORIDE 0.9 % IV SOLN
400.0000 mg/m2 | Freq: Once | INTRAVENOUS | Status: AC
  Administered 2022-09-14: 740 mg via INTRAVENOUS
  Filled 2022-09-14: qty 37

## 2022-09-14 MED ORDER — SODIUM CHLORIDE 0.9 % IV SOLN: Freq: Once | INTRAVENOUS | Status: AC

## 2022-09-14 MED ORDER — OXYCODONE HCL 5 MG PO TABS: 5.0000 mg | ORAL_TABLET | Freq: Three times a day (TID) | ORAL | 0 refills | Status: DC | PRN

## 2022-09-14 MED ORDER — SODIUM CHLORIDE 0.9% FLUSH
10.0000 mL | Freq: Once | INTRAVENOUS | Status: AC
  Administered 2022-09-14: 10 mL

## 2022-09-14 MED ORDER — ATROPINE SULFATE 1 MG/ML IV SOLN
0.5000 mg | Freq: Once | INTRAVENOUS | Status: AC | PRN
  Administered 2022-09-14: 0.5 mg via INTRAVENOUS
  Filled 2022-09-14: qty 1

## 2022-09-14 MED ORDER — SODIUM CHLORIDE 0.9 % IV SOLN
5.0000 mg/kg | Freq: Once | INTRAVENOUS | Status: AC
  Administered 2022-09-14: 350 mg via INTRAVENOUS
  Filled 2022-09-14: qty 14

## 2022-09-14 MED ORDER — SODIUM CHLORIDE 0.9 % IV SOLN
2400.0000 mg/m2 | INTRAVENOUS | Status: DC
  Administered 2022-09-14: 4450 mg via INTRAVENOUS
  Filled 2022-09-14: qty 89

## 2022-09-14 MED ORDER — SODIUM CHLORIDE 0.9 % IV SOLN
10.0000 mg | Freq: Once | INTRAVENOUS | Status: AC
  Administered 2022-09-14: 10 mg via INTRAVENOUS
  Filled 2022-09-14: qty 10

## 2022-09-14 MED ORDER — PALONOSETRON HCL INJECTION 0.25 MG/5ML
0.2500 mg | Freq: Once | INTRAVENOUS | Status: AC
  Administered 2022-09-14: 0.25 mg via INTRAVENOUS
  Filled 2022-09-14: qty 5

## 2022-09-14 MED ORDER — SODIUM CHLORIDE 0.9 % IV SOLN
160.0000 mg/m2 | Freq: Once | INTRAVENOUS | Status: AC
  Administered 2022-09-14: 300 mg via INTRAVENOUS
  Filled 2022-09-14: qty 15

## 2022-09-14 NOTE — Progress Notes (Signed)
[facility]   [phone] [phone]   Clinic Follow up Note   Patient Care Team: Pcp, No as PCP - General Pickenpack-Cousar, [name], NP as Nurse Practitioner (Nurse Practitioner) [name], MD as Consulting Physician (Hematology)  Date of Service:  09/14/2022  CHIEF COMPLAINT: f/u of metastatic rectal cancer  CURRENT THERAPY:  Second-line FOLFIRI, q2weeks, started 02/22/22 -Bevacizumab started 11/24/21  ASSESSMENT & PLAN:  Jorge [name] is a 71 y.o. male with   1. 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 CT AP 07/23/21 showed rectal mass with no significant adenopathy or distant metastasis. Colonoscopy 08/19/21 showed a 7 cm infiltrative mass in the mid rectum (6-13cm from anal verge), pathology showed adenocarcinoma. MMR is normal -staging pelvic MRI 08/27/21 showed: staging at T4b; N2 likely associated with early extra mesorectal lymph node involvement; distance from tumor to internal anal sphincter is 1.2 cm; also with extramural venous involvement. He has biopsy proven lung mets -baseline CEA 571.84 on 09/06/21. -he received concurrent chemoRT with Xeloda 09/06/21 - 09/26/21, followed by first-line CAPOX and bevacizumab 10/14/21 - 01/25/22. Xeloda dose reduced due to elevated liver enzymes. -due to new liver lesions, we switched to second-line FOLFIRI, with continued beva, on 02/21/22, with dose-reduced irinotecan for cycle 1. He tolerates well overall. -restaging CT CAP 08/25/22 showed stable to slightly improved disease.  -He is clinically stable. He reports his neuropathy is improving/better under control. Lab reviewed, CBC is WNL, CMP is improved. Urine protein negative. Adequate for treatment, will continue current chemotherapy.   2. Symptom Management: HTN, Weight loss, Neuropathy -elevated BP secondary to beva; we started him on low-dose  amlodipine on 03/02/22. He is compliant and endorses taking daily. -weight is stable.  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 He is on gabapentin and cymbalta, taking 2-3 oxycodone a day. He is followed by NP Lexine Baton.   3. Multiple colon polyps -He has a large 3.5 cm polyps in the hepatic flexure, biopsy showed tubular adenoma. In the context of his stage IV rectal cancer, he prefers to defer resection for now, which we agree -He had additional 4 small polyps which were removed, path showed tubular adenomas.     PLAN: -proceed with C15 FOLFIRI/beva today at same dose   -NP Lexine Baton will see him in infusion             -Fulphila injection with pump d/c on day 3 -lab, flush, f/u, and FOLFIRI/Beva on 11/29 as scheduled and in 5 weeks  -he prefers Thursday appointments (for pump d/c on Saturday)   No problem-specific Assessment & Plan notes found for this encounter.   SUMMARY OF ONCOLOGIC HISTORY: Oncology History Overview Note  Cancer Staging Rectal cancer [facility]) Staging form: Colon and Rectum, AJCC 8th Edition - Clinical stage from 08/29/2021: [name], cM1 - Signed by [name], MD on 08/29/2021    Rectal cancer ([place])  07/23/2021 Imaging   CT AP  IMPRESSION: 1. Appearance of the rectum likely indicates a rectal mass suspicious for rectal carcinoma. Inflammatory process would be less likely. Direct visualization is suggested. 2. Cholelithiasis without evidence of acute cholecystitis. 3. Aortic atherosclerosis.   08/19/2021 Procedure   Colonoscopy, under Dr. [name]  Impression: - Rectal tenderness, palpable rectal mass and hemorrhoids found on digital rectal exam. - Stool in the entire examined colon - lavaged with still inadequate clearance. - One 35 mm polyp at the hepatic flexure. Biopsied. Tattooed distal in  case future endoscopic resection is considered - however, has larger issues with rectal mass currently as below. - Four, 5 to 11 mm polyps in the transverse colon and at  the hepatic flexure, removed with a cold snare. Resected and retrieved. - Diverticulosis in the recto-sigmoid colon and in the sigmoid colon. - Rule out malignancy, partially obstructing tumor in the rectum and from 6 to 13 cm proximal to the anus. Biopsied.   08/19/2021 Pathology Results   Diagnosis 1. Hepatic Flexure Biopsy - TUBULAR ADENOMA WITHOUT HIGH-GRADE DYSPLASIA OR MALIGNANCY 2. Transverse Colon Biopsy, and hepatic flexure, polyps (4) - TUBULAR ADENOMA WITHOUT HIGH-GRADE DYSPLASIA OR MALIGNANCY - OTHER FRAGMENTS OF POLYPOID COLONIC MUCOSA WITH NO SPECIFIC HISTOPATHOLOGIC CHANGES - FOOD MATERIAL 3. Rectum, biopsy - ADENOCARCINOMA. SEE NOTE   08/22/2021 Initial Diagnosis   Rectal cancer ([place])   08/26/2021 Imaging   IMPRESSION: 1. A 2.2 x 1.9 cm right middle lobe pulmonary nodule as well as a total of four right upper lobe pulmonary nodule and masses measuring up to 3.6 cm. Findings concerning for metastatic primary lung cancer versus less likely metastases in a patient with rectal cancer. Additional imaging evaluation or consultation with Pulmonology or Thoracic Surgery recommended. 2. No gross hilar adenopathy, noting limited sensitivity for the detection of hilar adenopathy on this noncontrast study. 3. Cholelithiasis. 4.  Emphysema (ICD10-J43.9). 5. At least left anterior descending coronary artery calcifications.   08/27/2021 Imaging   IMPRESSION: Rectal adenocarcinoma T stage: T4 B   Rectal adenocarcinoma N stage: N2 disease likely associated with early extra mesorectal lymph node involvement.   Distance from tumor to the internal anal sphincter is 1.2 cm.   Also with extramural venous involvement as described.   08/29/2021 Cancer Staging   Staging form: Colon and Rectum, AJCC 8th Edition - Clinical stage from 08/29/2021: [name], cM1 - Signed by [name], MD on 08/29/2021 Stage prefix: Initial diagnosis   01/23/2022 Imaging   CT CAP w contrast  IMPRESSION: 1. Mild interval decrease in size of multiple right-sided pulmonary nodules. No new suspicious pulmonary nodule or mass. 2. Interval development of approximately 10 new ill-defined hypoattenuating liver lesions ranging in size from approximately 5-10 mm. Imaging features suspicious for metastatic disease. MRI abdomen with and without contrast may prove helpful to further evaluate. 3. Similar appearance of wall thickening in the rectum with perirectal edema. 4. Cholelithiasis. 5. Prostatomegaly. 6. Aortic Atherosclerosis (ICD10-I70.0).   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[place])  10/06/2021 Initial Diagnosis   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[place])   10/14/2021 - 01/25/2022 Chemotherapy   Patient is on Treatment Plan : COLORECTAL CapeOx + Bevacizumab q21d     02/21/2022 - 06/29/2022 Chemotherapy   Patient is on Treatment Plan : COLORECTAL FOLFIRI / BEVACIZUMAB Q14D     02/21/2022 -  Chemotherapy   Patient is on Treatment Plan : COLORECTAL FOLFIRI + Bevacizumab q14d        INTERVAL HISTORY:  Jorge [name] is here for a follow up of metastatic rectal cancer. He was last seen by me on 08/29/22. He presents to the clinic alone. He reports he had one day of moderate diarrhea, which resolved on its own. He reports the gabapentin has been helping his neuropathy.   All other systems were reviewed with the patient and are negative.  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 ([place]) 08/19/2021    SURGICAL HISTORY: Past Surgical  History:  Procedure Laterality Date   BRONCHIAL BIOPSY  09/27/2021   Procedure: BRONCHIAL BIOPSIES;  Surgeon: Garner Nash, DO;  Location: [facility];  Service: Pulmonary;;   BRONCHIAL BRUSHINGS  09/27/2021   Procedure: BRONCHIAL BRUSHINGS;  Surgeon: Garner Nash, DO;  Location: [facility];  Service: Pulmonary;;   BRONCHIAL NEEDLE ASPIRATION  BIOPSY  09/27/2021   Procedure: BRONCHIAL NEEDLE ASPIRATION BIOPSIES;  Surgeon: Garner Nash, DO;  Location: [facility];  Service: Pulmonary;;   COLONOSCOPY     IR IMAGING GUIDED PORT INSERTION  10/24/2021   left breast surgery Left 1968   VIDEO BRONCHOSCOPY WITH RADIAL ENDOBRONCHIAL ULTRASOUND  09/27/2021   Procedure: RADIAL ENDOBRONCHIAL ULTRASOUND;  Surgeon: Garner Nash, DO;  Location: [facility];  Service: Pulmonary;;    I have reviewed the social history and family history with the patient and they are unchanged from previous note.  ALLERGIES:  has No Known Allergies.  MEDICATIONS:  Current Outpatient Medications  Medication Sig Dispense Refill   amLODipine (NORVASC) 5 MG tablet Take 1 tablet (5 mg total) by mouth daily. 30 tablet 2   carbonyl iron (FEOSOL) 45 MG TABS tablet Take 45 mg by mouth daily.     DULoxetine (CYMBALTA) 20 MG capsule Take 1 capsule (20 mg total) by mouth daily. 30 capsule 1   gabapentin (NEURONTIN) 100 MG capsule Take 2 capsules (200 mg total) by mouth 3 (three) times daily. 180 capsule 1   Multiple Vitamin (MULTIVITAMIN WITH MINERALS) TABS tablet Take 1 tablet by mouth daily.     ondansetron (ZOFRAN) 8 MG tablet Take 1 tablet (8 mg total) by mouth every 8 (eight) hours as needed for nausea or vomiting. 30 tablet 0   oxyCODONE (OXY IR/ROXICODONE) 5 MG immediate release tablet Take 1 tablet (5 mg total) by mouth every 8 (eight) hours as needed for severe pain or breakthrough pain. 45 tablet 0   No current facility-administered medications for this visit.   Facility-Administered Medications Ordered in Other Visits  Medication Dose Route Frequency Provider Last Rate Last Admin   fluorouracil (ADRUCIL) 4,450 mg in sodium chloride 0.9 % 61 mL chemo infusion  2,400 mg/m2 (Treatment Plan Recorded) Intravenous 1 day or 1 dose [name], MD       irinotecan (CAMPTOSAR) 300 mg in sodium chloride 0.9 % 500 mL chemo infusion  160 mg/m2 (Treatment Plan  Recorded) Intravenous Once [name], MD 343 mL/hr at 09/14/22 0924 300 mg at 09/14/22 0924   leucovorin 740 mg in sodium chloride 0.9 % 250 mL infusion  400 mg/m2 (Treatment Plan Recorded) Intravenous Once [name], MD 191 mL/hr at 09/14/22 0920 740 mg at 09/14/22 0920    PHYSICAL EXAMINATION: ECOG PERFORMANCE STATUS: 1 - Symptomatic but completely ambulatory  Vitals:   09/14/22 0803  BP: (!) 152/80  Pulse: 62  Resp: 16  Temp: 98.3 F (36.8 C)  SpO2: 99%   Wt Readings from Last 3 Encounters:  09/14/22 149 lb 12.8 oz (67.9 kg)  08/29/22 140 lb 12.8 oz (63.9 kg)  08/16/22 148 lb 3.2 oz (67.2 kg)     GENERAL:alert, no distress and comfortable SKIN: skin color normal, no rashes or significant lesions EYES: normal, Conjunctiva are pink and non-injected, sclera clear  NEURO: alert & oriented x 3 with fluent speech  LABORATORY DATA:  I have reviewed the data as listed    Latest Ref Rng & Units 09/14/2022    7:31 AM 08/29/2022   10:11 AM 08/14/2022  10:30 AM  CBC  WBC 4.0 - 10.5 K/uL 5.1  5.4  6.4   Hemoglobin 13.0 - 17.0 g/dL 13.3  14.4  14.1   Hematocrit 39.0 - 52.0 % 41.1  43.8  43.4   Platelets 150 - 400 K/uL 162  178  168         Latest Ref Rng & Units 09/14/2022    7:31 AM 08/29/2022   10:11 AM 08/14/2022   10:30 AM  CMP  Glucose 70 - 99 mg/dL 120  90  108   BUN 8 - 23 mg/dL _0 Creatinine 0.61 - 1.24 mg/dL 0.82  0.89  0.86   Sodium 135 - 145 mmol/L 139  136  136   Potassium 3.5 - 5.1 mmol/L 4.4  4.5  4.1   Chloride 98 - 111 mmol/L 108  104  104   CO2 22 - 32 mmol/L _1 Calcium 8.9 - 10.3 mg/dL 8.7  8.9  8.7   Total Protein 6.5 - 8.1 g/dL 6.5  6.9  6.9   Total Bilirubin 0.3 - 1.2 mg/dL 0.3  0.5  0.3   Alkaline Phos 38 - 126 U/L 164  134  159   AST 15 - 41 U/L _2 ALT 0 - 44 U/L _3 RADIOGRAPHIC STUDIES: I have personally reviewed the radiological images as listed and agreed with the findings in the report. No  results found.    Orders Placed This Encounter  Procedures   CBC with Differential ([facility] Only)    Standing Status:   Future    Standing Expiration Date:   10/20/2023   CMP ([facility] only)    Standing Status:   Future    Standing Expiration Date:   10/20/2023   Total Protein, Urine dipstick    Standing Status:   Future    Standing Expiration Date:   10/20/2023   CBC with Differential ([facility] Only)    Standing Status:   Future    Standing Expiration Date:   11/03/2023   CMP ([facility] only)    Standing Status:   Future    Standing Expiration Date:   11/03/2023   Total Protein, Urine dipstick    Standing Status:   Future    Standing Expiration Date:   11/03/2023   All questions were answered. The patient knows to call the clinic with any problems, questions or concerns. No barriers to learning was detected. The total time spent in the appointment was 30 minutes.     [name], MD 09/14/2022   I, Wilburn Mylar, am acting as scribe for [name], MD.   I have reviewed the above documentation for accuracy and completeness, and I agree with the above.

## 2022-09-14 NOTE — Patient Instructions (Signed)
[facility] ONCOLOGY  Discharge Instructions: Thank you for choosing [facility] to provide your oncology and hematology care.   If you have a lab appointment with the [facility], please go directly to the [facility] and check in at the registration area.   Wear comfortable clothing and clothing appropriate for easy access to any Portacath or PICC line.   We strive to give you quality time with your provider. You may need to reschedule your appointment if you arrive late (15 or more minutes).  Arriving late affects you and other patients whose appointments are after yours.  Also, if you miss three or more appointments without notifying the office, you may be dismissed from the clinic at the provider's discretion.      For prescription refill requests, have your pharmacy contact our office and allow 72 hours for refills to be completed.    Today you received the following chemotherapy and/or immunotherapy agents: bevacizumab-bvzr, irinotecan, leucovorin, fluorouracil      To help prevent nausea and vomiting after your treatment, we encourage you to take your nausea medication as directed.  BELOW ARE SYMPTOMS THAT SHOULD BE REPORTED IMMEDIATELY: *FEVER GREATER THAN 100.4 F (38 C) OR HIGHER *CHILLS OR SWEATING *NAUSEA AND VOMITING THAT IS NOT CONTROLLED WITH YOUR NAUSEA MEDICATION *UNUSUAL SHORTNESS OF BREATH *UNUSUAL BRUISING OR BLEEDING *URINARY PROBLEMS (pain or burning when urinating, or frequent urination) *BOWEL PROBLEMS (unusual diarrhea, constipation, pain near the anus) TENDERNESS IN MOUTH AND THROAT WITH OR WITHOUT PRESENCE OF ULCERS (sore throat, sores in mouth, or a toothache) UNUSUAL RASH, SWELLING OR PAIN  UNUSUAL VAGINAL DISCHARGE OR ITCHING   Items with * indicate a potential emergency and should be followed up as soon as possible or go to the Emergency Department if any problems should occur.  Please show the CHEMOTHERAPY ALERT  CARD or IMMUNOTHERAPY ALERT CARD at check-in to the Emergency Department and triage nurse.  Should you have questions after your visit or need to cancel or reschedule your appointment, please contact [facility]  Dept: [phone]  and follow the prompts.  Office hours are 8:00 a.m. to 4:30 p.m. Monday - Friday. Please note that voicemails left after 4:00 p.m. may not be returned until the following business day.  We are closed weekends and major holidays. You have access to a nurse at all times for urgent questions. Please call the main number to the clinic Dept: [phone] and follow the prompts.   For any non-urgent questions, you may also contact your provider using MyChart. We now offer e-Visits for anyone 18 and older to request care online for non-urgent symptoms. For details visit [email or web address].   Also download the MyChart app! Go to the app store, search "MyChart", open the app, select [facility], and log in with your MyChart username and password.  Masks are optional in the cancer centers. If you would like for your care team to wear a mask while they are taking care of you, please let them know. You may have one support Jorge [name] who is at least 71 years old accompany you for your appointments.

## 2022-09-14 NOTE — Telephone Encounter (Signed)
From: [name] To: Jobe Gibbon, NP Sent: 09/14/2022 12:28 PM EST Subject: Medication Renewal Request  Refills have been requested for the following medications:   oxyCODONE (OXY IR/ROXICODONE) 5 MG immediate release tablet Jorge [name]]  [facility]erred [facility]: [name] [place] method: [employer]

## 2022-09-16 ENCOUNTER — Inpatient Hospital Stay: Payer: Medicare Other

## 2022-09-16 VITALS — BP 149/8 | HR 80 | Temp 97.7°F | Resp 18 | Ht 72.0 in

## 2022-09-16 DIAGNOSIS — C20 Malignant neoplasm of rectum: Secondary | ICD-10-CM

## 2022-09-16 DIAGNOSIS — Z5112 Encounter for antineoplastic immunotherapy: Secondary | ICD-10-CM | POA: Diagnosis not present

## 2022-09-16 MED ORDER — PEGFILGRASTIM-JMDB 6 MG/0.6ML ~~LOC~~ SOSY
6.0000 mg | PREFILLED_SYRINGE | Freq: Once | SUBCUTANEOUS | Status: AC
  Administered 2022-09-16: 6 mg via SUBCUTANEOUS

## 2022-09-16 MED ORDER — SODIUM CHLORIDE 0.9% FLUSH
10.0000 mL | INTRAVENOUS | Status: DC | PRN
  Administered 2022-09-16: 10 mL

## 2022-09-16 MED ORDER — ONDANSETRON HCL 8 MG PO TABS: 8.0000 mg | ORAL_TABLET | Freq: Three times a day (TID) | ORAL | 0 refills | Status: DC | PRN

## 2022-09-16 MED ORDER — HEPARIN SOD (PORK) LOCK FLUSH 100 UNIT/ML IV SOLN
500.0000 [IU] | Freq: Once | INTRAVENOUS | Status: AC | PRN
  Administered 2022-09-16: 500 [IU]

## 2022-09-27 ENCOUNTER — Other Ambulatory Visit (HOSPITAL_COMMUNITY): Payer: Self-pay

## 2022-10-02 ENCOUNTER — Other Ambulatory Visit: Payer: Self-pay | Admitting: Nurse Practitioner

## 2022-10-02 DIAGNOSIS — M792 Neuralgia and neuritis, unspecified: Secondary | ICD-10-CM

## 2022-10-02 DIAGNOSIS — C7800 Secondary malignant neoplasm of unspecified lung: Secondary | ICD-10-CM

## 2022-10-02 DIAGNOSIS — Z515 Encounter for palliative care: Secondary | ICD-10-CM

## 2022-10-02 DIAGNOSIS — G893 Neoplasm related pain (acute) (chronic): Secondary | ICD-10-CM

## 2022-10-02 MED ORDER — OXYCODONE HCL 5 MG PO TABS: 5.0000 mg | ORAL_TABLET | Freq: Three times a day (TID) | ORAL | 0 refills | Status: DC | PRN

## 2022-10-03 MED FILL — Dexamethasone Sodium Phosphate Inj 100 MG/10ML: INTRAMUSCULAR | Qty: 1 | Status: AC

## 2022-10-03 NOTE — Assessment & Plan Note (Signed)
-  [id number] with lung metastasis, MMR normal, KRAS(+) [id number]  -diagnosed 08/2021 by colonoscopy for rectal bleeding and frequent BM. Staging MRI showed early extra mesorectal lymph node involvement.  -baseline CEA 571.84 on 09/06/21.  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 Xeloda dose reduced due to elevated liver enzymes.  -due to new liver lesions, we switched to second-line FOLFIRI, with continued beva, on 02/21/22, with dose-reduced irinotecan for cycle 1. He tolerates well overall.

## 2022-10-03 NOTE — Progress Notes (Unsigned)
[place]   [phone]) [phone] Fax:(336) [phone]   Clinic Follow up Note   Patient Care Team: Pcp, No as PCP - General Pickenpack-Cousar, [name], NP as Nurse Practitioner (Nurse Practitioner) [name], MD as Consulting Physician (Hematology)  Date of Service:  10/04/2022  CHIEF COMPLAINT: f/u of metastatic rectal cancer   CURRENT THERAPY:  Second-line FOLFIRI, q2weeks, started 02/22/22 -Bevacizumab started 11/24/21 ASSESSMENT:  Jorge [name] is a 71 y.o. male with      Rectal cancer ([place]) -[id number] with lung metastasis, MMR normal, KRAS(+) [id number]  -diagnosed 08/2021 by colonoscopy for rectal bleeding and frequent BM. Staging MRI showed early extra mesorectal lymph node involvement.  -baseline CEA 571.84 on 09/06/21.  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 Xeloda dose reduced due to elevated liver enzymes.  -due to new liver lesions, we switched to second-line FOLFIRI, with continued beva, on 02/21/22, with dose-reduced irinotecan for cycle 1. He tolerates well overall.  -We discussed maintenance therapy today, we will probably change his treatment to irinotecan and bevacizumab in January 2024   2. Symptom Management: HTN, Weight loss, Neuropathy -elevated BP secondary to beva; we started him on low-dose amlodipine on 03/02/22. He is compliant and endorses taking daily. -weight is stable.  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 He is on gabapentin and cymbalta, taking 2-3 oxycodone a day. He is followed by NP Lexine Baton.   3. Multiple colon polyps -He has a large 3.5 cm polyps in the hepatic flexure, biopsy showed tubular adenoma. In the context of his stage IV rectal cancer, he prefers to defer resection for now, which we agree -He had additional 4 small polyps which were removed, path showed tubular  adenomas.   PLAN: -proceed with C15 FOLFIRI/beva today at same dose, lab adequate for treatment -f/u in 2 weeks     -NP Lexine Baton will see him in infusion    SUMMARY OF ONCOLOGIC HISTORY: Oncology History Overview Note  Cancer Staging Rectal cancer [facility]) Staging form: Colon and Rectum, AJCC 8th Edition - Clinical stage from 08/29/2021: [name], cM1 - Signed by [name], MD on 08/29/2021    Rectal cancer ([place])  07/23/2021 Imaging   CT AP  IMPRESSION: 1. Appearance of the rectum likely indicates a rectal mass suspicious for rectal carcinoma. Inflammatory process would be less likely. Direct visualization is suggested. 2. Cholelithiasis without evidence of acute cholecystitis. 3. Aortic atherosclerosis.   08/19/2021 Procedure   Colonoscopy, under Dr. [name]  Impression: - Rectal tenderness, palpable rectal mass and hemorrhoids found on digital rectal exam. - Stool in the entire examined colon - lavaged with still inadequate clearance. - One 35 mm polyp at the hepatic flexure. Biopsied. Tattooed distal in case future endoscopic resection is considered - however, has larger issues with rectal mass currently as below. - Four, 5 to 11 mm polyps in the transverse colon and at the hepatic flexure, removed with a cold snare. Resected and retrieved. - Diverticulosis in the recto-sigmoid colon and in the sigmoid colon. - Rule out malignancy, partially obstructing tumor in the rectum and from 6 to 13 cm proximal to the anus. Biopsied.   08/19/2021 Pathology Results   Diagnosis 1. Hepatic Flexure Biopsy - TUBULAR ADENOMA WITHOUT HIGH-GRADE DYSPLASIA OR MALIGNANCY 2. Transverse Colon Biopsy, and hepatic flexure, polyps (4) - TUBULAR ADENOMA WITHOUT HIGH-GRADE DYSPLASIA OR MALIGNANCY - OTHER FRAGMENTS OF POLYPOID COLONIC MUCOSA WITH  NO SPECIFIC HISTOPATHOLOGIC CHANGES - FOOD MATERIAL 3. Rectum, biopsy - ADENOCARCINOMA. SEE NOTE   08/22/2021 Initial Diagnosis   Rectal cancer  ([place])   08/26/2021 Imaging   IMPRESSION: 1. A 2.2 x 1.9 cm right middle lobe pulmonary nodule as well as a total of four right upper lobe pulmonary nodule and masses measuring up to 3.6 cm. Findings concerning for metastatic primary lung cancer versus less likely metastases in a patient with rectal cancer. Additional imaging evaluation or consultation with Pulmonology or Thoracic Surgery recommended. 2. No gross hilar adenopathy, noting limited sensitivity for the detection of hilar adenopathy on this noncontrast study. 3. Cholelithiasis. 4.  Emphysema (ICD10-J43.9). 5. At least left anterior descending coronary artery calcifications.   08/27/2021 Imaging   IMPRESSION: Rectal adenocarcinoma T stage: T4 B   Rectal adenocarcinoma N stage: N2 disease likely associated with early extra mesorectal lymph node involvement.   Distance from tumor to the internal anal sphincter is 1.2 cm.   Also with extramural venous involvement as described.   08/29/2021 Cancer Staging   Staging form: Colon and Rectum, AJCC 8th Edition - Clinical stage from 08/29/2021: [name], cM1 - Signed by [name], MD on 08/29/2021 Stage prefix: Initial diagnosis   01/23/2022 Imaging   CT CAP w contrast IMPRESSION: 1. Mild interval decrease in size of multiple right-sided pulmonary nodules. No new suspicious pulmonary nodule or mass. 2. Interval development of approximately 10 new ill-defined hypoattenuating liver lesions ranging in size from approximately 5-10 mm. Imaging features suspicious for metastatic disease. MRI abdomen with and without contrast may prove helpful to further evaluate. 3. Similar appearance of wall thickening in the rectum with perirectal edema. 4. Cholelithiasis. 5. Prostatomegaly. 6. Aortic Atherosclerosis (ICD10-I70.0).   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 Pt reports the pain is still there but is managing it.Pt states the pump is annoying but the chemo is fine.   All other systems were reviewed with the patient and are negative.  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 ([place]) 08/19/2021    SURGICAL HISTORY: Past Surgical History:  Procedure Laterality Date   BRONCHIAL BIOPSY  09/27/2021   Procedure: BRONCHIAL BIOPSIES;  Surgeon: Garner [facility], DO;  Location: [facility];  Service: Pulmonary;;   BRONCHIAL BRUSHINGS  09/27/2021   Procedure: BRONCHIAL BRUSHINGS;  Surgeon: Garner Nash, DO;  Location: [facility];  Service: Pulmonary;;   BRONCHIAL NEEDLE ASPIRATION BIOPSY  09/27/2021   Procedure: BRONCHIAL NEEDLE ASPIRATION BIOPSIES;  Surgeon: Garner Nash, DO;  Location: [facility];  Service: Pulmonary;;   COLONOSCOPY     IR IMAGING GUIDED PORT INSERTION  10/24/2021   left breast surgery Left 1968   VIDEO BRONCHOSCOPY WITH RADIAL ENDOBRONCHIAL ULTRASOUND  09/27/2021   Procedure: RADIAL  ENDOBRONCHIAL ULTRASOUND;  Surgeon: Garner Nash, DO;  Location: [facility];  Service: Pulmonary;;    I have reviewed the social history and family history with the patient and they are unchanged from previous note.  ALLERGIES:  has No Known Allergies.  MEDICATIONS:  Current Outpatient Medications  Medication Sig Dispense Refill   amLODipine (NORVASC) 5 MG tablet Take 1 tablet (5 mg  total) by mouth daily. 30 tablet 2   carbonyl iron (FEOSOL) 45 MG TABS tablet Take 45 mg by mouth daily.     DULoxetine (CYMBALTA) 20 MG capsule Take 1 capsule (20 mg total) by mouth daily. 30 capsule 1   gabapentin (NEURONTIN) 100 MG capsule Take 2 capsules (200 mg total) by mouth 3 (three) times daily. 180 capsule 1   Multiple Vitamin (MULTIVITAMIN WITH MINERALS) TABS tablet Take 1 tablet by mouth daily.     ondansetron (ZOFRAN) 8 MG tablet Take 1 tablet (8 mg total) by mouth every 8 (eight) hours as needed for nausea or vomiting. 30 tablet 0   oxyCODONE (OXY IR/ROXICODONE) 5 MG immediate release tablet Take 1 tablet (5 mg total) by mouth every 8 (eight) hours as needed for severe pain or breakthrough pain. 45 tablet 0   No current facility-administered medications for this visit.   Facility-Administered Medications Ordered in Other Visits  Medication Dose Route Frequency Provider Last Rate Last Admin   fluorouracil (ADRUCIL) 4,450 mg in sodium chloride 0.9 % 61 mL chemo infusion  2,400 mg/m2 (Treatment Plan Recorded) Intravenous 1 day or 1 dose [name], MD       heparin lock flush 100 unit/mL  500 Units Intracatheter Once PRN [name], MD       irinotecan (CAMPTOSAR) 300 mg in sodium chloride 0.9 % 500 mL chemo infusion  160 mg/m2 (Treatment Plan Recorded) Intravenous Once [name], MD 343 mL/hr at 10/04/22 1220 300 mg at 10/04/22 1220   leucovorin 740 mg in sodium chloride 0.9 % 250 mL infusion  400 mg/m2 (Treatment Plan Recorded) Intravenous Once [name], MD 191 mL/hr at 10/04/22 1222 740 mg at 10/04/22 1222   sodium chloride flush (NS) 0.9 % injection 10 mL  10 mL Intracatheter PRN [name], MD        PHYSICAL EXAMINATION: ECOG PERFORMANCE STATUS: 1 - Symptomatic but completely ambulatory  Vitals:   10/04/22 1022  BP: (!) 160/92  Pulse: 64  Resp: 17  Temp: 98.1 F (36.7 C)  SpO2: 98%   Wt Readings from Last 3 Encounters:  10/04/22 152 lb 12.8 oz (69.3 kg)  09/14/22 149  lb 12.8 oz (67.9 kg)  08/29/22 140 lb 12.8 oz (63.9 kg)     GENERAL:alert, no distress and comfortable SKIN: skin color normal, no rashes or significant lesions EYES: normal, Conjunctiva are pink and non-injected, sclera clear  NEURO: alert & oriented x 3 with fluent speech   LABORATORY DATA:  I have reviewed the data as listed    Latest Ref Rng & Units 10/04/2022    9:50 AM 09/14/2022    7:31 AM 08/29/2022   10:11 AM  CBC  WBC 4.0 - 10.5 K/uL 4.1  5.1  5.4   Hemoglobin 13.0 - 17.0 g/dL 13.4  13.3  14.4   Hematocrit 39.0 - 52.0 % 41.4  41.1  43.8   Platelets 150 - 400 K/uL 180  162  178         Latest Ref Rng & Units 10/04/2022    9:50 AM 09/14/2022  7:31 AM 08/29/2022   10:11 AM  CMP  Glucose 70 - 99 mg/dL 110  120  90   BUN 8 - 23 mg/dL _0 Creatinine 0.61 - 1.24 mg/dL 0.78  0.82  0.89   Sodium 135 - 145 mmol/L 137  139  136   Potassium 3.5 - 5.1 mmol/L 4.4  4.4  4.5   Chloride 98 - 111 mmol/L 104  108  104   CO2 22 - 32 mmol/L _1 Calcium 8.9 - 10.3 mg/dL 8.9  8.7  8.9   Total Protein 6.5 - 8.1 g/dL 7.1  6.5  6.9   Total Bilirubin 0.3 - 1.2 mg/dL 0.4  0.3  0.5   Alkaline Phos 38 - 126 U/L 144  164  134   AST 15 - 41 U/L _2 ALT 0 - 44 U/L _3 RADIOGRAPHIC STUDIES: I have personally reviewed the radiological images as listed and agreed with the findings in the report. No results found.    No orders of the defined types were placed in this encounter.  All questions were answered. The patient knows to call the clinic with any problems, questions or concerns. No barriers to learning was detected. The total time spent in the appointment was 30 minutes.     [name], MD 10/04/2022   Felicity Coyer, CMA, am acting as scribe for [name], MD.   I have reviewed the above documentation for accuracy and completeness, and I agree with the above.

## 2022-10-04 ENCOUNTER — Encounter: Payer: Self-pay | Admitting: Nurse Practitioner

## 2022-10-04 ENCOUNTER — Inpatient Hospital Stay (HOSPITAL_BASED_OUTPATIENT_CLINIC_OR_DEPARTMENT_OTHER): Payer: Medicare Other | Admitting: Nurse Practitioner

## 2022-10-04 ENCOUNTER — Inpatient Hospital Stay: Payer: Medicare Other

## 2022-10-04 ENCOUNTER — Other Ambulatory Visit: Payer: Self-pay

## 2022-10-04 ENCOUNTER — Inpatient Hospital Stay (HOSPITAL_BASED_OUTPATIENT_CLINIC_OR_DEPARTMENT_OTHER): Payer: Medicare Other | Admitting: Hematology

## 2022-10-04 ENCOUNTER — Encounter: Payer: Self-pay | Admitting: Hematology

## 2022-10-04 VITALS — BP 160/92 | HR 64 | Temp 98.1°F | Resp 17 | Ht 72.0 in | Wt 152.8 lb

## 2022-10-04 VITALS — BP 167/77 | HR 85 | Resp 18

## 2022-10-04 DIAGNOSIS — C7800 Secondary malignant neoplasm of unspecified lung: Secondary | ICD-10-CM

## 2022-10-04 DIAGNOSIS — C20 Malignant neoplasm of rectum: Secondary | ICD-10-CM | POA: Diagnosis not present

## 2022-10-04 DIAGNOSIS — Z515 Encounter for palliative care: Secondary | ICD-10-CM | POA: Diagnosis not present

## 2022-10-04 DIAGNOSIS — M792 Neuralgia and neuritis, unspecified: Secondary | ICD-10-CM

## 2022-10-04 DIAGNOSIS — Z7189 Other specified counseling: Secondary | ICD-10-CM

## 2022-10-04 DIAGNOSIS — G893 Neoplasm related pain (acute) (chronic): Secondary | ICD-10-CM | POA: Diagnosis not present

## 2022-10-04 DIAGNOSIS — Z5112 Encounter for antineoplastic immunotherapy: Secondary | ICD-10-CM | POA: Diagnosis not present

## 2022-10-04 DIAGNOSIS — Z95828 Presence of other vascular implants and grafts: Secondary | ICD-10-CM

## 2022-10-04 DIAGNOSIS — R530 Neoplastic (malignant) related fatigue: Secondary | ICD-10-CM

## 2022-10-04 LAB — CMP (CANCER CENTER ONLY)
ALT: 17 U/L (ref 0–44)
AST: 22 U/L (ref 15–41)
Albumin: 3.8 g/dL (ref 3.5–5.0)
Alkaline Phosphatase: 144 U/L — ABNORMAL HIGH (ref 38–126)
Anion gap: 3 — ABNORMAL LOW (ref 5–15)
BUN: 10 mg/dL (ref 8–23)
CO2: 30 mmol/L (ref 22–32)
Calcium: 8.9 mg/dL (ref 8.9–10.3)
Chloride: 104 mmol/L (ref 98–111)
Creatinine: 0.78 mg/dL (ref 0.61–1.24)
GFR, Estimated: >60 mL/min (ref >60)
Glucose, Bld: 110 mg/dL — ABNORMAL HIGH (ref 70–99)
Potassium: 4.4 mmol/L (ref 3.5–5.1)
Sodium: 137 mmol/L (ref 135–145)
Total Bilirubin: 0.4 mg/dL (ref 0.3–1.2)
Total Protein: 7.1 g/dL (ref 6.5–8.1)

## 2022-10-04 LAB — CBC WITH DIFFERENTIAL (CANCER CENTER ONLY)
Abs Immature Granulocytes: 0.01 10*3/uL (ref 0.00–0.07)
Basophils Absolute: 0.1 10*3/uL (ref 0.0–0.1)
Basophils Relative: 1 %
Eosinophils Absolute: 0.2 10*3/uL (ref 0.0–0.5)
Eosinophils Relative: 6 %
HCT: 41.4 % (ref 39.0–52.0)
Hemoglobin: 13.4 g/dL (ref 13.0–17.0)
Immature Granulocytes: 0 %
Lymphocytes Relative: 25 %
Lymphs Abs: 1 10*3/uL (ref 0.7–4.0)
MCH: 29.5 pg (ref 26.0–34.0)
MCHC: 32.4 g/dL (ref 30.0–36.0)
MCV: 91 fL (ref 80.0–100.0)
Monocytes Absolute: 0.9 10*3/uL (ref 0.1–1.0)
Monocytes Relative: 22 %
Neutro Abs: 1.9 10*3/uL (ref 1.7–7.7)
Neutrophils Relative %: 46 %
Platelet Count: 180 10*3/uL (ref 150–400)
RBC: 4.55 MIL/uL (ref 4.22–5.81)
RDW: 16 % — ABNORMAL HIGH (ref 11.5–15.5)
WBC Count: 4.1 10*3/uL (ref 4.0–10.5)
nRBC: 0 % (ref 0.0–0.2)

## 2022-10-04 LAB — TOTAL PROTEIN, URINE DIPSTICK: Protein, ur: NEGATIVE mg/dL

## 2022-10-04 MED ORDER — SODIUM CHLORIDE 0.9 % IV SOLN
2400.0000 mg/m2 | INTRAVENOUS | Status: DC
  Administered 2022-10-04: 4450 mg via INTRAVENOUS
  Filled 2022-10-04: qty 89

## 2022-10-04 MED ORDER — SODIUM CHLORIDE 0.9 % IV SOLN
400.0000 mg/m2 | Freq: Once | INTRAVENOUS | Status: AC
  Administered 2022-10-04: 740 mg via INTRAVENOUS
  Filled 2022-10-04: qty 25

## 2022-10-04 MED ORDER — HEPARIN SOD (PORK) LOCK FLUSH 100 UNIT/ML IV SOLN: 500.0000 [IU] | Freq: Once | INTRAVENOUS | Status: DC | PRN

## 2022-10-04 MED ORDER — SODIUM CHLORIDE 0.9% FLUSH
10.0000 mL | Freq: Once | INTRAVENOUS | Status: AC
  Administered 2022-10-04: 10 mL

## 2022-10-04 MED ORDER — SODIUM CHLORIDE 0.9 % IV SOLN: Freq: Once | INTRAVENOUS | Status: AC

## 2022-10-04 MED ORDER — ATROPINE SULFATE 1 MG/ML IV SOLN
0.5000 mg | Freq: Once | INTRAVENOUS | Status: AC | PRN
  Administered 2022-10-04: 0.5 mg via INTRAVENOUS
  Filled 2022-10-04: qty 1

## 2022-10-04 MED ORDER — SODIUM CHLORIDE 0.9 % IV SOLN
160.0000 mg/m2 | Freq: Once | INTRAVENOUS | Status: AC
  Administered 2022-10-04: 300 mg via INTRAVENOUS
  Filled 2022-10-04: qty 15

## 2022-10-04 MED ORDER — SODIUM CHLORIDE 0.9 % IV SOLN
10.0000 mg | Freq: Once | INTRAVENOUS | Status: AC
  Administered 2022-10-04: 10 mg via INTRAVENOUS
  Filled 2022-10-04: qty 10

## 2022-10-04 MED ORDER — SODIUM CHLORIDE 0.9% FLUSH: 10.0000 mL | INTRAVENOUS | Status: DC | PRN

## 2022-10-04 MED ORDER — SODIUM CHLORIDE 0.9 % IV SOLN
5.0000 mg/kg | Freq: Once | INTRAVENOUS | Status: AC
  Administered 2022-10-04: 350 mg via INTRAVENOUS
  Filled 2022-10-04: qty 14

## 2022-10-04 MED ORDER — PALONOSETRON HCL INJECTION 0.25 MG/5ML
0.2500 mg | Freq: Once | INTRAVENOUS | Status: AC
  Administered 2022-10-04: 0.25 mg via INTRAVENOUS
  Filled 2022-10-04: qty 5

## 2022-10-04 NOTE — Patient Instructions (Signed)
Steps to Quit Smoking Smoking tobacco is the leading cause of preventable death. It can affect almost every organ in the body. Smoking puts you and people around you at risk for many serious, long-lasting (chronic) diseases. Quitting smoking can be hard, but it is one of the best things that you can do for your health. It is never too late to quit. Do not give up if you cannot quit the first time. Some people need to try many times to quit. Do your best to stick to your quit plan, and talk with your doctor if you have any questions or concerns. How do I get ready to quit? Pick a date to quit. Set a date within the next 2 weeks to give you time to prepare. Write down the reasons why you are quitting. Keep this list in places where you will see it often. Tell your family, friends, and co-workers that you are quitting. Their support is important. Talk with your doctor about the choices that may help you quit. Find out if your health insurance will pay for these treatments. Know the people, places, things, and activities that make you want to smoke (triggers). Avoid them. What first steps can I take to quit smoking? Throw away all cigarettes at home, at work, and in your car. Throw away the things that you use when you smoke, such as ashtrays and lighters. Clean your car. Empty the ashtray. Clean your home, including curtains and carpets. What can I do to help me quit smoking? Talk with your doctor about taking medicines and seeing a counselor. You are more likely to succeed when you do both. If you are pregnant or breastfeeding: Talk with your doctor about counseling or other ways to quit smoking. Do not take medicine to help you quit smoking unless your doctor tells you to. Quit right away Quit smoking completely, instead of slowly cutting back on how much you smoke over a period of time. Stopping smoking right away may be more successful than slowly quitting. Go to counseling. In-person is best  if this is an option. You are more likely to quit if you go to counseling sessions regularly. Take medicine You may take medicines to help you quit. Some medicines need a prescription, and some you can buy over-the-counter. Some medicines may contain a drug called nicotine to replace the nicotine in cigarettes. Medicines may: Help you stop having the desire to smoke (cravings). Help to stop the problems that come when you stop smoking (withdrawal symptoms). Your doctor may ask you to use: Nicotine patches, gum, or lozenges. Nicotine inhalers or sprays. Non-nicotine medicine that you take by mouth. Find resources Find resources and other ways to help you quit smoking and remain smoke-free after you quit. They include: Online chats with a counselor. Phone quitlines. Printed self-help materials. Support groups or group counseling. Text messaging programs. Mobile phone apps. Use apps on your mobile phone or tablet that can help you stick to your quit plan. Examples of free services include Quit Guide from the CDC and smokefree.gov  What can I do to make it easier to quit?  Talk to your family and friends. Ask them to support and encourage you. Call a phone quitline, such as 1-800-QUIT-NOW, reach out to support groups, or work with a counselor. Ask people who smoke to not smoke around you. Avoid places that make you want to smoke, such as: Bars. Parties. Smoke-break areas at work. Spend time with people who do not smoke. Lower   the stress in your life. Stress can make you want to smoke. Try these things to lower stress: Getting regular exercise. Doing deep-breathing exercises. Doing yoga. Meditating. What benefits will I see if I quit smoking? Over time, you may have: A better sense of smell and taste. Less coughing and sore throat. A slower heart rate. Lower blood pressure. Clearer skin. Better breathing. Fewer sick days. Summary Quitting smoking can be hard, but it is one of  the best things that you can do for your health. Do not give up if you cannot quit the first time. Some people need to try many times to quit. When you decide to quit smoking, make a plan to help you succeed. Quit smoking right away, not slowly over a period of time. When you start quitting, get help and support to keep you smoke-free. This information is not intended to replace advice given to you by your health care provider. Make sure you discuss any questions you have with your health care provider. Document Revised: 10/14/2021 Document Reviewed: 10/14/2021 Elsevier Patient Education  2023 Elsevier Inc.  

## 2022-10-04 NOTE — Patient Instructions (Signed)
[facility]  Discharge Instructions: Thank you for choosing [facility] to provide your oncology and hematology care.   If you have a lab appointment with the [facility], please go directly to the [facility] and check in at the registration area.   Wear comfortable clothing and clothing appropriate for easy access to any Portacath or PICC line.   We strive to give you quality time with your provider. You may need to reschedule your appointment if you arrive late (15 or more minutes).  Arriving late affects you and other patients whose appointments are after yours.  Also, if you miss three or more appointments without notifying the office, you may be dismissed from the clinic at the provider's discretion.      For prescription refill requests, have your pharmacy contact our office and allow 72 hours for refills to be completed.    Today you received the following chemotherapy and/or immunotherapy agents: bevacizumab-bvzr, irinotecan, leucovorin, fluorouracil      To help prevent nausea and vomiting after your treatment, we encourage you to take your nausea medication as directed.  BELOW ARE SYMPTOMS THAT SHOULD BE REPORTED IMMEDIATELY: *FEVER GREATER THAN 100.4 F (38 C) OR HIGHER *CHILLS OR SWEATING *NAUSEA AND VOMITING THAT IS NOT CONTROLLED WITH YOUR NAUSEA MEDICATION *UNUSUAL SHORTNESS OF BREATH *UNUSUAL BRUISING OR BLEEDING *URINARY PROBLEMS (pain or burning when urinating, or frequent urination) *BOWEL PROBLEMS (unusual diarrhea, constipation, pain near the anus) TENDERNESS IN MOUTH AND THROAT WITH OR WITHOUT PRESENCE OF ULCERS (sore throat, sores in mouth, or a toothache) UNUSUAL RASH, SWELLING OR PAIN  UNUSUAL VAGINAL DISCHARGE OR ITCHING   Items with * indicate a potential emergency and should be followed up as soon as possible or go to the Emergency Department if any problems should occur.  Please show the CHEMOTHERAPY ALERT  CARD or IMMUNOTHERAPY ALERT CARD at check-in to the Emergency Department and triage nurse.  Should you have questions after your visit or need to cancel or reschedule your appointment, please contact [facility]  Dept: [phone]  and follow the prompts.  Office hours are 8:00 a.m. to 4:30 p.m. Monday - Friday. Please note that voicemails left after 4:00 p.m. may not be returned until the following business day.  We are closed weekends and major holidays. You have access to a nurse at all times for urgent questions. Please call the main number to the clinic Dept: [phone] and follow the prompts.   For any non-urgent questions, you may also contact your provider using MyChart. We now offer e-Visits for anyone 15 and older to request care online for non-urgent symptoms. For details visit [email or web address].   Also download the MyChart app! Go to the app store, search "MyChart", open the app, select [facility], and log in with your MyChart username and password.  Masks are optional in the cancer centers. If you would like for your care team to wear a mask while they are taking care of you, please let them know. You may have one support person who is at least 70 years old accompany you for your appointments.

## 2022-10-04 NOTE — Progress Notes (Signed)
[place]  Telephone:(336) [phone] Fax:(336) [phone]   Name: Jorge [name] Date: 10/04/2022 MRN: [id number]  DOB: July 11, 1951  Patient Care Team: Pcp, No as PCP - General Pickenpack-Cousar, [name], NP as Nurse Practitioner (Nurse Practitioner) [name], MD as Consulting Physician (Hematology)   INTERVAL HISTORY: Jorge [name] is a 71 y.o. 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  Palliative ask to see for symptom management and goals of care.   SOCIAL HISTORY:     reports that he has been smoking cigarettes. He has a 5.00 pack-year smoking history. He has never used smokeless tobacco. He reports that he does not currently use alcohol. He reports that he does not use drugs.  ADVANCE DIRECTIVES:    CODE STATUS:   PAST MEDICAL HISTORY: 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 ([place]) 08/19/2021    ALLERGIES:  has No Known Allergies.  MEDICATIONS:  Current Outpatient Medications  Medication Sig Dispense Refill   amLODipine (NORVASC) 5 MG tablet Take 1 tablet (5 mg total) by mouth daily. 30 tablet 2   carbonyl iron (FEOSOL) 45 MG TABS tablet Take 45 mg by mouth daily.     DULoxetine (CYMBALTA) 20 MG capsule Take 1 capsule (20 mg total) by mouth daily. 30 capsule 1   gabapentin (NEURONTIN) 100 MG capsule Take 2 capsules (200 mg total) by mouth 3 (three) times daily. 180 capsule 1   Multiple Vitamin (MULTIVITAMIN WITH MINERALS) TABS tablet Take 1 tablet by mouth daily.     ondansetron (ZOFRAN) 8 MG tablet Take 1 tablet (8 mg total) by mouth every 8 (eight) hours as needed for nausea or vomiting. 30 tablet 0   oxyCODONE (OXY IR/ROXICODONE) 5 MG immediate release tablet Take 1 tablet (5 mg total) by mouth every 8 (eight) hours as needed for severe pain or breakthrough pain. 45 tablet 0   No current  facility-administered medications for this visit.   Facility-Administered Medications Ordered in Other Visits  Medication Dose Route Frequency Provider Last Rate Last Admin   fluorouracil (ADRUCIL) 4,450 mg in sodium chloride 0.9 % 61 mL chemo infusion  2,400 mg/m2 (Treatment Plan Recorded) Intravenous 1 day or 1 dose [name], MD   Infusion Verify at 10/04/22 1416   heparin lock flush 100 unit/mL  500 Units Intracatheter Once PRN [name], MD       sodium chloride flush (NS) 0.9 % injection 10 mL  10 mL Intracatheter PRN [name], MD        VITAL SIGNS: There were no vitals taken for this visit. There were no vitals filed for this visit.   Estimated body mass index is 20.72 kg/m as calculated from the following:   Height as of an earlier encounter on 10/04/22: 6' (1.829 m).   Weight as of an earlier encounter on 10/04/22: 152 lb 12.8 oz (69.3 kg).   PERFORMANCE STATUS (ECOG) : 1 - Symptomatic but completely ambulatory  Assessment NAD, ambulatory RRR Normal breathing pattern  AAO x3, mood appropriate    IMPRESSION: I saw [name] during his infusion today for symptom management follow-up. No acute distress noted. Continues to feel well. Appetite is good. Denies nausea, vomiting, diarrhea, or constipation. Spends time telling me about his grandchildren and spending time with family for Thanksgiving.   Neuropathic Pain  [name]'s endorses pain is well  controlled on current regimen. Some days are better than others pending level of activity and around treatment days. Much improvement in his neuropathic symptoms allowing him to walk further distance and longer. He is tolerating cymbalta and gabapentin. Continues to use Oxy IR for severe breakthrough pain. Does not require around the clock. The "on fire" feeling has decreased.   Discussions regarding continuing his current regimen. He verbalized understanding and appreciation.     7/27: Jorge [name] endorses ongoing hands/feet  burning and tingling.  He states when he gets up to walk he must stand for a few minutes and gain his bearings to allow his feet to catch it with his body and also be able to prepare himself for the discomfort.  He does have some difficulty with opening items due to discomfort in his hands.  He reports there is not anything that makes the numbness and tingling better other than medication.  Ambulating long distances, standing for long periods of time, and utilizing his hands for certain activities escalates his discomfort.  Education provided on neuropathic pain and ways to effectively manage with a goal of hopefully improving symptoms.  He understands symptoms may not resolve but if we are able to decrease his discomfort this will be ideal for his quality of life.  We reviewed at length his current regimen: Gabapentin 200 mg twice daily and Oxy IR 5 mg every 6 hours as needed.  We discussed the use of both medications with understanding that for his neuropathic pain gabapentin is probably most effective.  He verbalized understanding and has had similar conversations with [name]. He reports he only uses his Oxy IR when pain is severe.  States he averages 1-3 tablets/day.  Most days he is taking 2 tablets.  He confirms he is taking gabapentin as prescribed.  He does notice some drowsiness at times however is able to remain active throughout the day.  I discussed the importance of continued conversation with family and their medical providers regarding overall plan of care and treatment options, ensuring decisions are within the context of the patients values and GOCs.  PLAN:  Gabapentin '300mg'$  three times daily.   Cymbalta '20mg'$  daily. Tolerating well.  Oxy IR '5mg'$  every 8 hours as needed. Not requiring around the clock.  I will plan to see patient back in 3-4 weeks in collaboration with his oncology appointments.   Patient expressed understanding and was in agreement with this plan. He also understands  that He can call the clinic at any time with any questions, concerns, or complaints.    Any controlled substances utilized were prescribed in the context of palliative care. PDMP has been reviewed.   Time Total: 20 min   Visit consisted of counseling and education dealing with the complex and emotionally intense issues of symptom management and palliative care in the setting of serious and potentially life-threatening illness.Greater than 50%  of this time was spent counseling and coordinating care related to the above assessment and plan.  [name], AGPCNP-BC  Palliative Medicine Team/[place]

## 2022-10-05 ENCOUNTER — Telehealth: Payer: Self-pay | Admitting: Hematology

## 2022-10-05 NOTE — Telephone Encounter (Signed)
Called to notify of new upcoming appointments. Patient notified.

## 2022-10-06 ENCOUNTER — Inpatient Hospital Stay: Payer: Medicare Other | Attending: Physician Assistant

## 2022-10-06 ENCOUNTER — Other Ambulatory Visit: Payer: Self-pay

## 2022-10-06 VITALS — BP 154/77 | HR 68 | Temp 98.4°F | Resp 18

## 2022-10-06 DIAGNOSIS — C7801 Secondary malignant neoplasm of right lung: Secondary | ICD-10-CM | POA: Insufficient documentation

## 2022-10-06 DIAGNOSIS — C20 Malignant neoplasm of rectum: Secondary | ICD-10-CM | POA: Diagnosis present

## 2022-10-06 DIAGNOSIS — C775 Secondary and unspecified malignant neoplasm of intrapelvic lymph nodes: Secondary | ICD-10-CM | POA: Diagnosis not present

## 2022-10-06 DIAGNOSIS — Z5111 Encounter for antineoplastic chemotherapy: Secondary | ICD-10-CM | POA: Diagnosis present

## 2022-10-06 DIAGNOSIS — Z5189 Encounter for other specified aftercare: Secondary | ICD-10-CM | POA: Diagnosis not present

## 2022-10-06 DIAGNOSIS — C787 Secondary malignant neoplasm of liver and intrahepatic bile duct: Secondary | ICD-10-CM | POA: Insufficient documentation

## 2022-10-06 DIAGNOSIS — Z5112 Encounter for antineoplastic immunotherapy: Secondary | ICD-10-CM | POA: Insufficient documentation

## 2022-10-06 DIAGNOSIS — C7800 Secondary malignant neoplasm of unspecified lung: Secondary | ICD-10-CM

## 2022-10-06 DIAGNOSIS — Z79899 Other long term (current) drug therapy: Secondary | ICD-10-CM | POA: Insufficient documentation

## 2022-10-06 MED ORDER — PEGFILGRASTIM-JMDB 6 MG/0.6ML ~~LOC~~ SOSY
6.0000 mg | PREFILLED_SYRINGE | Freq: Once | SUBCUTANEOUS | Status: AC
  Administered 2022-10-06: 6 mg via SUBCUTANEOUS
  Filled 2022-10-06: qty 0.6

## 2022-10-06 MED ORDER — SODIUM CHLORIDE 0.9% FLUSH
10.0000 mL | INTRAVENOUS | Status: DC | PRN
  Administered 2022-10-06: 10 mL

## 2022-10-06 MED ORDER — HEPARIN SOD (PORK) LOCK FLUSH 100 UNIT/ML IV SOLN
500.0000 [IU] | Freq: Once | INTRAVENOUS | Status: AC | PRN
  Administered 2022-10-06: 500 [IU]

## 2022-10-12 ENCOUNTER — Encounter: Payer: Self-pay | Admitting: Hematology

## 2022-10-12 ENCOUNTER — Other Ambulatory Visit: Payer: Self-pay | Admitting: Hematology

## 2022-10-12 ENCOUNTER — Other Ambulatory Visit (HOSPITAL_COMMUNITY): Payer: Self-pay

## 2022-10-12 MED ORDER — ONDANSETRON HCL 8 MG PO TABS: 8.0000 mg | ORAL_TABLET | Freq: Three times a day (TID) | ORAL | 0 refills | Status: DC | PRN

## 2022-10-12 NOTE — Telephone Encounter (Signed)
Filled per pt MyChart request

## 2022-10-13 NOTE — Progress Notes (Signed)
The following biosimilar Vegzelma (bevacizumab-adcd)  has been selected for use in this patient.   Mayford Alberg D. Gilberto Streck, PharmD  

## 2022-10-16 ENCOUNTER — Other Ambulatory Visit: Payer: Self-pay | Admitting: Nurse Practitioner

## 2022-10-16 DIAGNOSIS — Z515 Encounter for palliative care: Secondary | ICD-10-CM

## 2022-10-16 DIAGNOSIS — M792 Neuralgia and neuritis, unspecified: Secondary | ICD-10-CM

## 2022-10-16 DIAGNOSIS — G893 Neoplasm related pain (acute) (chronic): Secondary | ICD-10-CM

## 2022-10-16 DIAGNOSIS — C7800 Secondary malignant neoplasm of unspecified lung: Secondary | ICD-10-CM

## 2022-10-16 MED ORDER — OXYCODONE HCL 5 MG PO TABS: 5.0000 mg | ORAL_TABLET | Freq: Three times a day (TID) | ORAL | 0 refills | Status: DC | PRN

## 2022-10-17 NOTE — Progress Notes (Unsigned)
Jorge [name]   Telephone:(336) [phone] Fax:(336) [phone]   Clinic Follow up Note   Patient Care Team: Pcp, No as PCP - General Pickenpack-Cousar, [name], NP as Nurse Practitioner (Nurse Practitioner) Jorge Merle, MD as Consulting Physician (Hematology)  Date of Service:  10/19/2022  CHIEF COMPLAINT: f/u of metastatic rectal cancer    CURRENT THERAPY:   Second-line FOLFIRI, q2weeks, started 02/22/22 -Bevacizumab started 11/24/21  ASSESSMENT:  Jorge [name] is a 71 y.o. male with   Rectal cancer ([place]) -Jorge [name] with lung metastasis, MMR normal, KRAS(+) [id number]  -diagnosed 08/2021 by colonoscopy for rectal bleeding and frequent BM. Staging MRI showed early extra mesorectal lymph node involvement.  -baseline CEA 571.84 on 09/06/21.  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 Xeloda dose reduced due to elevated liver enzymes.  -due to new liver lesions, we switched to second-line FOLFIRI, with continued beva, on 02/21/22. He tolerates well overall.   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 He is on gabapentin and cymbalta, taking 2-3 oxycodone a day. He is followed by NP Jorge [name].      PLAN: - lab reviewed. -proceed with C8 FOLFIRI/beva today at same dose, lab adequate for treatment -refill gabapentin -CT scan before OV on 1/18  -Lab,flush,f/u 1/4 and chemo      SUMMARY OF ONCOLOGIC HISTORY: Oncology History Overview Note  Cancer Staging Rectal cancer [facility]) Staging form: Colon and Rectum, AJCC 8th Edition - Clinical stage from 08/29/2021: [name], cM1 - Signed by Jorge Merle, MD on 08/29/2021    Rectal cancer ([place])  07/23/2021 Imaging   CT AP  IMPRESSION: 1. Appearance of the rectum likely indicates a rectal mass suspicious for rectal carcinoma. Inflammatory process  would be less likely. Direct visualization is suggested. 2. Cholelithiasis without evidence of acute cholecystitis. 3. Aortic atherosclerosis.   08/19/2021 Procedure   Colonoscopy, under Dr. [name]  Impression: - Rectal tenderness, palpable rectal mass and hemorrhoids found on digital rectal exam. - Stool in the entire examined colon - lavaged with still inadequate clearance. - One 35 mm polyp at the hepatic flexure. Biopsied. Tattooed distal in case future endoscopic resection is considered - however, has larger issues with rectal mass currently as below. - Four, 5 to 11 mm polyps in the transverse colon and at the hepatic flexure, removed with a cold snare. Resected and retrieved. - Diverticulosis in the recto-sigmoid colon and in the sigmoid colon. - Rule out malignancy, partially obstructing tumor in the rectum and from 6 to 13 cm proximal to the anus. Biopsied.   08/19/2021 Pathology Results   Diagnosis 1. Hepatic Flexure Biopsy - TUBULAR ADENOMA WITHOUT HIGH-GRADE DYSPLASIA OR MALIGNANCY 2. Transverse Colon Biopsy, and hepatic flexure, polyps (4) - TUBULAR ADENOMA WITHOUT HIGH-GRADE DYSPLASIA OR MALIGNANCY - OTHER FRAGMENTS OF POLYPOID COLONIC MUCOSA WITH NO SPECIFIC HISTOPATHOLOGIC CHANGES - FOOD MATERIAL 3. Rectum, biopsy - ADENOCARCINOMA. SEE NOTE   08/22/2021 Initial Diagnosis   Rectal cancer ([place])   08/26/2021 Imaging   IMPRESSION: 1. A 2.2 x 1.9 cm right middle lobe pulmonary nodule as well as a total of four right upper lobe pulmonary nodule and masses measuring up to 3.6 cm. Findings concerning for metastatic primary lung cancer versus less likely metastases in a patient with rectal cancer. Additional imaging evaluation or consultation with Pulmonology or Thoracic Surgery recommended. 2. No gross hilar adenopathy, noting limited sensitivity  for the detection of hilar adenopathy on this noncontrast study. 3. Cholelithiasis. 4.  Emphysema (ICD10-J43.9). 5. At  least left anterior descending coronary artery calcifications.   08/27/2021 Imaging   IMPRESSION: Rectal adenocarcinoma T stage: T4 B   Rectal adenocarcinoma N stage: N2 disease likely associated with early extra mesorectal lymph node involvement.   Distance from tumor to the internal anal sphincter is 1.2 cm.   Also with extramural venous involvement as described.   08/29/2021 Cancer Staging   Staging form: Colon and Rectum, AJCC 8th Edition - Clinical stage from 08/29/2021: [name], cM1 - Signed by Jorge Merle, MD on 08/29/2021 Stage prefix: Initial diagnosis   01/23/2022 Imaging   CT CAP w contrast IMPRESSION: 1. Mild interval decrease in size of multiple right-sided pulmonary nodules. No new suspicious pulmonary nodule or mass. 2. Interval development of approximately 10 new ill-defined hypoattenuating liver lesions ranging in size from approximately 5-10 mm. Imaging features suspicious for metastatic disease. MRI abdomen with and without contrast may prove helpful to further evaluate. 3. Similar appearance of wall thickening in the rectum with perirectal edema. 4. Cholelithiasis. 5. Prostatomegaly. 6. Aortic Atherosclerosis (ICD10-I70.0).   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[place])  10/06/2021 Initial Diagnosis   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[place])   10/14/2021 - 01/25/2022 Chemotherapy   Patient is on Treatment Plan : COLORECTAL CapeOx + Bevacizumab q21d     02/21/2022 - 06/29/2022 Chemotherapy   Patient is on Treatment Plan : COLORECTAL FOLFIRI / BEVACIZUMAB Q14D     02/21/2022 -  Chemotherapy   Patient is on Treatment Plan : COLORECTAL FOLFIRI + Bevacizumab q14d        INTERVAL HISTORY:  Jorge [name] is here for a follow up of metastatic rectal cancer   He was last seen by me on 10/04/2022 He presents to the clinic alone. Pt reports that is pain is stable.Pt denies nausea and vomiting.  Pt reports of having diarrhea last about two days. He states he's  daughter was in a car accident with great great grand kids.no     All other systems were reviewed with the patient and are negative.  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 ([place]) 08/19/2021    SURGICAL HISTORY: Past Surgical History:  Procedure Laterality Date   BRONCHIAL BIOPSY  09/27/2021   Procedure: BRONCHIAL BIOPSIES;  Surgeon: Garner Nash, DO;  Location: [facility];  Service: Pulmonary;;   BRONCHIAL BRUSHINGS  09/27/2021   Procedure: BRONCHIAL BRUSHINGS;  Surgeon: Garner Nash, DO;  Location: [facility];  Service: Pulmonary;;   BRONCHIAL NEEDLE ASPIRATION BIOPSY  09/27/2021   Procedure: BRONCHIAL NEEDLE ASPIRATION BIOPSIES;  Surgeon: Garner Nash, DO;  Location: [facility];  Service: Pulmonary;;   COLONOSCOPY     IR IMAGING GUIDED PORT INSERTION  10/24/2021   left breast surgery Left 1968   VIDEO BRONCHOSCOPY WITH RADIAL ENDOBRONCHIAL ULTRASOUND  09/27/2021   Procedure: RADIAL ENDOBRONCHIAL ULTRASOUND;  Surgeon: Garner Nash, DO;  Location: [place] [facility];  Service: Pulmonary;;    I have reviewed the social history and family history with the patient and they are unchanged from previous note.  ALLERGIES:  has No Known Allergies.  MEDICATIONS:  Current Outpatient Medications  Medication Sig Dispense Refill   amLODipine (NORVASC) 5 MG tablet Take 1 tablet (5 mg total) by mouth daily. 30 tablet 2   carbonyl iron (FEOSOL) 45 MG TABS tablet  Take 45 mg by mouth daily.     DULoxetine (CYMBALTA) 20 MG capsule Take 1 capsule (20 mg total) by mouth daily. 30 capsule 1   gabapentin (NEURONTIN) 100 MG capsule Take 2 capsules (200 mg total) by mouth 3 (three) times daily. 180 capsule 1   Multiple Vitamin (MULTIVITAMIN WITH MINERALS) TABS tablet Take 1 tablet by mouth daily.     ondansetron (ZOFRAN) 8 MG tablet Take 1 tablet (8 mg total) by mouth every 8 (eight) hours as  needed for nausea or vomiting. 30 tablet 0   oxyCODONE (OXY IR/ROXICODONE) 5 MG immediate release tablet Take 1 tablet (5 mg total) by mouth every 8 (eight) hours as needed for severe pain or breakthrough pain. 45 tablet 0   No current facility-administered medications for this visit.    PHYSICAL EXAMINATION: ECOG PERFORMANCE STATUS: 1 - Symptomatic but completely ambulatory  Vitals:   10/19/22 0957  BP: (!) 152/70  Pulse: 83  Resp: 15  Temp: 98.4 F (36.9 C)  SpO2: 94%   Wt Readings from Last 3 Encounters:  10/19/22 151 lb 1.6 oz (68.5 kg)  10/04/22 152 lb 12.8 oz (69.3 kg)  09/14/22 149 lb 12.8 oz (67.9 kg)     GENERAL:alert, no distress and comfortable SKIN: skin color normal, no rashes or significant lesions EYES: normal, Conjunctiva are pink and non-injected, sclera clear  NEURO: alert & oriented x 3 with fluent speech  LABORATORY DATA:  I have reviewed the data as listed    Latest Ref Rng & Units 10/19/2022    9:37 AM 10/04/2022    9:50 AM 09/14/2022    7:31 AM  CBC  WBC 4.0 - 10.5 K/uL 6.5  4.1  5.1   Hemoglobin 13.0 - 17.0 g/dL 13.4  13.4  13.3   Hematocrit 39.0 - 52.0 % 41.6  41.4  41.1   Platelets 150 - 400 K/uL 154  180  162         Latest Ref Rng & Units 10/04/2022    9:50 AM 09/14/2022    7:31 AM 08/29/2022   10:11 AM  CMP  Glucose 70 - 99 mg/dL 110  120  90   BUN 8 - 23 mg/dL _0 Creatinine 0.61 - 1.24 mg/dL 0.78  0.82  0.89   Sodium 135 - 145 mmol/L 137  139  136   Potassium 3.5 - 5.1 mmol/L 4.4  4.4  4.5   Chloride 98 - 111 mmol/L 104  108  104   CO2 22 - 32 mmol/L _1 Calcium 8.9 - 10.3 mg/dL 8.9  8.7  8.9   Total Protein 6.5 - 8.1 g/dL 7.1  6.5  6.9   Total Bilirubin 0.3 - 1.2 mg/dL 0.4  0.3  0.5   Alkaline Phos 38 - 126 U/L 144  164  134   AST 15 - 41 U/L _2 ALT 0 - 44 U/L _3 RADIOGRAPHIC STUDIES: I have personally reviewed the radiological images as listed and agreed with the findings in the  report. No results found.    Orders Placed This Encounter  Procedures   CT CHEST ABDOMEN PELVIS W CONTRAST    Standing Status:   Future    Standing Expiration Date:   10/20/2023    Order Specific Question:   Preferred imaging location?    Answer:  [facility]    Order Specific Question:   Release to patient    Answer:   Immediate    Order Specific Question:   Is Oral Contrast requested for this exam?    Answer:   Yes, Per Radiology protocol   CBC with Differential ([place] Only)    Standing Status:   Future    Standing Expiration Date:   11/24/2023   CMP ([place] only)    Standing Status:   Future    Standing Expiration Date:   11/24/2023   Total Protein, Urine dipstick    Standing Status:   Future    Standing Expiration Date:   11/24/2023   All questions were answered. The patient knows to call the clinic with any problems, questions or concerns. No barriers to learning was detected. The total time spent in the appointment was 30 minutes.     Jorge Merle, MD 10/19/2022   Felicity Coyer, CMA, am acting as scribe for Jorge Merle, MD.   I have reviewed the above documentation for accuracy and completeness, and I agree with the above.

## 2022-10-18 DIAGNOSIS — G620 Drug-induced polyneuropathy: Secondary | ICD-10-CM | POA: Insufficient documentation

## 2022-10-18 MED FILL — Dexamethasone Sodium Phosphate Inj 100 MG/10ML: INTRAMUSCULAR | Qty: 1 | Status: AC

## 2022-10-18 NOTE — Assessment & Plan Note (Signed)
-  [id number] with lung metastasis, MMR normal, KRAS(+) [id number]  -diagnosed 08/2021 by colonoscopy for rectal bleeding and frequent BM. Staging MRI showed early extra mesorectal lymph node involvement.  -baseline CEA 571.84 on 09/06/21.  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 Xeloda dose reduced due to elevated liver enzymes.  -due to new liver lesions, we switched to second-line FOLFIRI, with continued beva, on 02/21/22. He tolerates well overall.

## 2022-10-18 NOTE — Assessment & Plan Note (Signed)
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 He is on gabapentin and cymbalta, taking 2-3 oxycodone a day. He is followed by NP Lexine Baton.

## 2022-10-19 ENCOUNTER — Other Ambulatory Visit: Payer: Self-pay

## 2022-10-19 ENCOUNTER — Inpatient Hospital Stay: Payer: Medicare Other

## 2022-10-19 ENCOUNTER — Inpatient Hospital Stay (HOSPITAL_BASED_OUTPATIENT_CLINIC_OR_DEPARTMENT_OTHER): Payer: Medicare Other | Admitting: Hematology

## 2022-10-19 ENCOUNTER — Encounter: Payer: Self-pay | Admitting: Hematology

## 2022-10-19 VITALS — BP 152/70 | HR 83 | Temp 98.4°F | Resp 15 | Ht 72.0 in | Wt 151.1 lb

## 2022-10-19 DIAGNOSIS — T451X5A Adverse effect of antineoplastic and immunosuppressive drugs, initial encounter: Secondary | ICD-10-CM | POA: Diagnosis not present

## 2022-10-19 DIAGNOSIS — C20 Malignant neoplasm of rectum: Secondary | ICD-10-CM

## 2022-10-19 DIAGNOSIS — G620 Drug-induced polyneuropathy: Secondary | ICD-10-CM

## 2022-10-19 DIAGNOSIS — C7800 Secondary malignant neoplasm of unspecified lung: Secondary | ICD-10-CM

## 2022-10-19 DIAGNOSIS — D500 Iron deficiency anemia secondary to blood loss (chronic): Secondary | ICD-10-CM

## 2022-10-19 DIAGNOSIS — Z95828 Presence of other vascular implants and grafts: Secondary | ICD-10-CM

## 2022-10-19 DIAGNOSIS — Z5112 Encounter for antineoplastic immunotherapy: Secondary | ICD-10-CM | POA: Diagnosis not present

## 2022-10-19 LAB — CBC WITH DIFFERENTIAL (CANCER CENTER ONLY)
Abs Immature Granulocytes: 0.02 10*3/uL (ref 0.00–0.07)
Basophils Absolute: 0 10*3/uL (ref 0.0–0.1)
Basophils Relative: 1 %
Eosinophils Absolute: 0 10*3/uL (ref 0.0–0.5)
Eosinophils Relative: 1 %
HCT: 41.6 % (ref 39.0–52.0)
Hemoglobin: 13.4 g/dL (ref 13.0–17.0)
Immature Granulocytes: 0 %
Lymphocytes Relative: 13 %
Lymphs Abs: 0.9 10*3/uL (ref 0.7–4.0)
MCH: 28.8 pg (ref 26.0–34.0)
MCHC: 32.2 g/dL (ref 30.0–36.0)
MCV: 89.5 fL (ref 80.0–100.0)
Monocytes Absolute: 1.1 10*3/uL — ABNORMAL HIGH (ref 0.1–1.0)
Monocytes Relative: 17 %
Neutro Abs: 4.5 10*3/uL (ref 1.7–7.7)
Neutrophils Relative %: 68 %
Platelet Count: 154 10*3/uL (ref 150–400)
RBC: 4.65 MIL/uL (ref 4.22–5.81)
RDW: 16.3 % — ABNORMAL HIGH (ref 11.5–15.5)
WBC Count: 6.5 10*3/uL (ref 4.0–10.5)
nRBC: 0 % (ref 0.0–0.2)

## 2022-10-19 LAB — CMP (CANCER CENTER ONLY)
ALT: 14 U/L (ref 0–44)
AST: 22 U/L (ref 15–41)
Albumin: 3.5 g/dL (ref 3.5–5.0)
Alkaline Phosphatase: 119 U/L (ref 38–126)
Anion gap: 3 — ABNORMAL LOW (ref 5–15)
BUN: 12 mg/dL (ref 8–23)
CO2: 30 mmol/L (ref 22–32)
Calcium: 8.5 mg/dL — ABNORMAL LOW (ref 8.9–10.3)
Chloride: 102 mmol/L (ref 98–111)
Creatinine: 0.93 mg/dL (ref 0.61–1.24)
GFR, Estimated: >60 mL/min (ref >60)
Glucose, Bld: 106 mg/dL — ABNORMAL HIGH (ref 70–99)
Potassium: 4.1 mmol/L (ref 3.5–5.1)
Sodium: 135 mmol/L (ref 135–145)
Total Bilirubin: 0.7 mg/dL (ref 0.3–1.2)
Total Protein: 6.5 g/dL (ref 6.5–8.1)

## 2022-10-19 LAB — TOTAL PROTEIN, URINE DIPSTICK: Protein, ur: <30 mg/dL — AB

## 2022-10-19 LAB — FERRITIN: Ferritin: 256 ng/mL (ref 24–336)

## 2022-10-19 MED ORDER — SODIUM CHLORIDE 0.9 % IV SOLN
160.0000 mg/m2 | Freq: Once | INTRAVENOUS | Status: AC
  Administered 2022-10-19: 300 mg via INTRAVENOUS
  Filled 2022-10-19: qty 15

## 2022-10-19 MED ORDER — SODIUM CHLORIDE 0.9 % IV SOLN
10.0000 mg | Freq: Once | INTRAVENOUS | Status: AC
  Administered 2022-10-19: 10 mg via INTRAVENOUS
  Filled 2022-10-19: qty 10

## 2022-10-19 MED ORDER — SODIUM CHLORIDE 0.9% FLUSH
10.0000 mL | Freq: Once | INTRAVENOUS | Status: AC
  Administered 2022-10-19: 10 mL

## 2022-10-19 MED ORDER — PALONOSETRON HCL INJECTION 0.25 MG/5ML
0.2500 mg | Freq: Once | INTRAVENOUS | Status: AC
  Administered 2022-10-19: 0.25 mg via INTRAVENOUS
  Filled 2022-10-19: qty 5

## 2022-10-19 MED ORDER — SODIUM CHLORIDE 0.9 % IV SOLN
400.0000 mg/m2 | Freq: Once | INTRAVENOUS | Status: AC
  Administered 2022-10-19: 740 mg via INTRAVENOUS
  Filled 2022-10-19: qty 25

## 2022-10-19 MED ORDER — ATROPINE SULFATE 1 MG/ML IV SOLN
0.5000 mg | Freq: Once | INTRAVENOUS | Status: AC | PRN
  Administered 2022-10-19: 0.5 mg via INTRAVENOUS
  Filled 2022-10-19: qty 1

## 2022-10-19 MED ORDER — GABAPENTIN 100 MG PO CAPS: 200.0000 mg | ORAL_CAPSULE | Freq: Three times a day (TID) | ORAL | 1 refills | Status: DC

## 2022-10-19 MED ORDER — SODIUM CHLORIDE 0.9 % IV SOLN
2400.0000 mg/m2 | INTRAVENOUS | Status: DC
  Administered 2022-10-19: 4450 mg via INTRAVENOUS
  Filled 2022-10-19: qty 89

## 2022-10-19 MED ORDER — SODIUM CHLORIDE 0.9 % IV SOLN: Freq: Once | INTRAVENOUS | Status: AC

## 2022-10-19 MED ORDER — SODIUM CHLORIDE 0.9 % IV SOLN
5.0000 mg/kg | Freq: Once | INTRAVENOUS | Status: AC
  Administered 2022-10-19: 350 mg via INTRAVENOUS
  Filled 2022-10-19: qty 14

## 2022-10-20 ENCOUNTER — Other Ambulatory Visit (HOSPITAL_COMMUNITY): Payer: Self-pay

## 2022-10-20 ENCOUNTER — Encounter: Payer: Self-pay | Admitting: Hematology

## 2022-10-21 ENCOUNTER — Inpatient Hospital Stay: Payer: Medicare Other

## 2022-10-21 ENCOUNTER — Telehealth: Payer: Self-pay | Admitting: Hematology

## 2022-10-21 VITALS — BP 161/68 | HR 86 | Temp 97.9°F | Resp 17 | Ht 72.0 in

## 2022-10-21 DIAGNOSIS — Z5112 Encounter for antineoplastic immunotherapy: Secondary | ICD-10-CM | POA: Diagnosis not present

## 2022-10-21 DIAGNOSIS — C7800 Secondary malignant neoplasm of unspecified lung: Secondary | ICD-10-CM

## 2022-10-21 MED ORDER — PEGFILGRASTIM-JMDB 6 MG/0.6ML ~~LOC~~ SOSY
6.0000 mg | PREFILLED_SYRINGE | Freq: Once | SUBCUTANEOUS | Status: AC
  Administered 2022-10-21: 6 mg via SUBCUTANEOUS

## 2022-10-21 MED ORDER — HEPARIN SOD (PORK) LOCK FLUSH 100 UNIT/ML IV SOLN
500.0000 [IU] | Freq: Once | INTRAVENOUS | Status: AC | PRN
  Administered 2022-10-21: 500 [IU]

## 2022-10-21 MED ORDER — SODIUM CHLORIDE 0.9% FLUSH
10.0000 mL | INTRAVENOUS | Status: DC | PRN
  Administered 2022-10-21: 10 mL

## 2022-10-21 NOTE — Telephone Encounter (Signed)
Left patient a voicemail regarding upcoming appointments  

## 2022-10-25 ENCOUNTER — Other Ambulatory Visit: Payer: Self-pay

## 2022-10-25 MED ORDER — ONDANSETRON HCL 8 MG PO TABS: 8.0000 mg | ORAL_TABLET | Freq: Three times a day (TID) | ORAL | 0 refills | Status: DC | PRN

## 2022-10-25 MED ORDER — DULOXETINE HCL 20 MG PO CPEP: 20.0000 mg | ORAL_CAPSULE | Freq: Every day | ORAL | 1 refills | Status: DC

## 2022-10-31 ENCOUNTER — Other Ambulatory Visit: Payer: Self-pay | Admitting: Nurse Practitioner

## 2022-10-31 ENCOUNTER — Other Ambulatory Visit (HOSPITAL_COMMUNITY): Payer: Self-pay

## 2022-10-31 DIAGNOSIS — C20 Malignant neoplasm of rectum: Secondary | ICD-10-CM

## 2022-10-31 DIAGNOSIS — M792 Neuralgia and neuritis, unspecified: Secondary | ICD-10-CM

## 2022-10-31 DIAGNOSIS — Z515 Encounter for palliative care: Secondary | ICD-10-CM

## 2022-10-31 DIAGNOSIS — G893 Neoplasm related pain (acute) (chronic): Secondary | ICD-10-CM

## 2022-10-31 MED ORDER — OXYCODONE HCL 5 MG PO TABS: 5.0000 mg | ORAL_TABLET | Freq: Three times a day (TID) | ORAL | 0 refills | Status: DC | PRN

## 2022-10-31 NOTE — Telephone Encounter (Signed)
From: [name] To: Jobe Gibbon, NP Sent: 10/31/2022 12:44 PM EST Subject: Medication Renewal Request  Refills have been requested for the following medications:   oxyCODONE (OXY IR/ROXICODONE) 5 MG immediate release tablet Chesley Noon Pickenpack-Cousar]  [facility]erred pharmacy: [id number] [place] [address] method: [employer]

## 2022-11-02 ENCOUNTER — Ambulatory Visit: Payer: Medicare Other

## 2022-11-02 ENCOUNTER — Other Ambulatory Visit: Payer: Medicare Other

## 2022-11-02 ENCOUNTER — Ambulatory Visit: Payer: Medicare Other | Admitting: Nurse Practitioner

## 2022-11-08 MED FILL — Dexamethasone Sodium Phosphate Inj 100 MG/10ML: INTRAMUSCULAR | Qty: 1 | Status: AC

## 2022-11-09 ENCOUNTER — Encounter: Payer: Self-pay | Admitting: Nurse Practitioner

## 2022-11-09 ENCOUNTER — Inpatient Hospital Stay (HOSPITAL_BASED_OUTPATIENT_CLINIC_OR_DEPARTMENT_OTHER): Payer: Medicare Other | Admitting: Nurse Practitioner

## 2022-11-09 ENCOUNTER — Inpatient Hospital Stay: Payer: Medicare Other | Attending: Physician Assistant

## 2022-11-09 ENCOUNTER — Inpatient Hospital Stay: Payer: Medicare Other

## 2022-11-09 ENCOUNTER — Other Ambulatory Visit: Payer: Self-pay

## 2022-11-09 DIAGNOSIS — Z5112 Encounter for antineoplastic immunotherapy: Secondary | ICD-10-CM | POA: Diagnosis present

## 2022-11-09 DIAGNOSIS — Z5111 Encounter for antineoplastic chemotherapy: Secondary | ICD-10-CM | POA: Diagnosis present

## 2022-11-09 DIAGNOSIS — C775 Secondary and unspecified malignant neoplasm of intrapelvic lymph nodes: Secondary | ICD-10-CM | POA: Diagnosis not present

## 2022-11-09 DIAGNOSIS — Z5189 Encounter for other specified aftercare: Secondary | ICD-10-CM | POA: Insufficient documentation

## 2022-11-09 DIAGNOSIS — C7800 Secondary malignant neoplasm of unspecified lung: Secondary | ICD-10-CM

## 2022-11-09 DIAGNOSIS — C20 Malignant neoplasm of rectum: Secondary | ICD-10-CM | POA: Insufficient documentation

## 2022-11-09 DIAGNOSIS — C787 Secondary malignant neoplasm of liver and intrahepatic bile duct: Secondary | ICD-10-CM | POA: Diagnosis not present

## 2022-11-09 DIAGNOSIS — R530 Neoplastic (malignant) related fatigue: Secondary | ICD-10-CM

## 2022-11-09 DIAGNOSIS — Z95828 Presence of other vascular implants and grafts: Secondary | ICD-10-CM

## 2022-11-09 DIAGNOSIS — Z79899 Other long term (current) drug therapy: Secondary | ICD-10-CM | POA: Diagnosis not present

## 2022-11-09 DIAGNOSIS — Z515 Encounter for palliative care: Secondary | ICD-10-CM

## 2022-11-09 DIAGNOSIS — F17210 Nicotine dependence, cigarettes, uncomplicated: Secondary | ICD-10-CM | POA: Insufficient documentation

## 2022-11-09 DIAGNOSIS — M792 Neuralgia and neuritis, unspecified: Secondary | ICD-10-CM | POA: Diagnosis not present

## 2022-11-09 LAB — CMP (CANCER CENTER ONLY)
ALT: 16 U/L (ref 0–44)
AST: 22 U/L (ref 15–41)
Albumin: 3.6 g/dL (ref 3.5–5.0)
Alkaline Phosphatase: 131 U/L — ABNORMAL HIGH (ref 38–126)
Anion gap: 3 — ABNORMAL LOW (ref 5–15)
BUN: 8 mg/dL (ref 8–23)
CO2: 30 mmol/L (ref 22–32)
Calcium: 9.1 mg/dL (ref 8.9–10.3)
Chloride: 103 mmol/L (ref 98–111)
Creatinine: 0.84 mg/dL (ref 0.61–1.24)
GFR, Estimated: >60 mL/min (ref >60)
Glucose, Bld: 103 mg/dL — ABNORMAL HIGH (ref 70–99)
Potassium: 4.4 mmol/L (ref 3.5–5.1)
Sodium: 136 mmol/L (ref 135–145)
Total Bilirubin: 0.4 mg/dL (ref 0.3–1.2)
Total Protein: 6.8 g/dL (ref 6.5–8.1)

## 2022-11-09 LAB — CBC WITH DIFFERENTIAL (CANCER CENTER ONLY)
Abs Immature Granulocytes: 0.01 10*3/uL (ref 0.00–0.07)
Basophils Absolute: 0.1 10*3/uL (ref 0.0–0.1)
Basophils Relative: 1 %
Eosinophils Absolute: 0.1 10*3/uL (ref 0.0–0.5)
Eosinophils Relative: 3 %
HCT: 41.2 % (ref 39.0–52.0)
Hemoglobin: 13.4 g/dL (ref 13.0–17.0)
Immature Granulocytes: 0 %
Lymphocytes Relative: 24 %
Lymphs Abs: 0.9 10*3/uL (ref 0.7–4.0)
MCH: 28.8 pg (ref 26.0–34.0)
MCHC: 32.5 g/dL (ref 30.0–36.0)
MCV: 88.6 fL (ref 80.0–100.0)
Monocytes Absolute: 0.6 10*3/uL (ref 0.1–1.0)
Monocytes Relative: 17 %
Neutro Abs: 2 10*3/uL (ref 1.7–7.7)
Neutrophils Relative %: 55 %
Platelet Count: 235 10*3/uL (ref 150–400)
RBC: 4.65 MIL/uL (ref 4.22–5.81)
RDW: 16.3 % — ABNORMAL HIGH (ref 11.5–15.5)
WBC Count: 3.7 10*3/uL — ABNORMAL LOW (ref 4.0–10.5)
nRBC: 0 % (ref 0.0–0.2)

## 2022-11-09 LAB — TOTAL PROTEIN, URINE DIPSTICK: Protein, ur: <30 mg/dL — AB

## 2022-11-09 MED ORDER — SODIUM CHLORIDE 0.9 % IV SOLN
2400.0000 mg/m2 | INTRAVENOUS | Status: DC
  Administered 2022-11-09: 4450 mg via INTRAVENOUS
  Filled 2022-11-09: qty 89

## 2022-11-09 MED ORDER — SODIUM CHLORIDE 0.9 % IV SOLN
5.0000 mg/kg | Freq: Once | INTRAVENOUS | Status: AC
  Administered 2022-11-09: 350 mg via INTRAVENOUS
  Filled 2022-11-09: qty 14

## 2022-11-09 MED ORDER — SODIUM CHLORIDE 0.9 % IV SOLN
400.0000 mg/m2 | Freq: Once | INTRAVENOUS | Status: AC
  Administered 2022-11-09: 740 mg via INTRAVENOUS
  Filled 2022-11-09: qty 37

## 2022-11-09 MED ORDER — SODIUM CHLORIDE 0.9% FLUSH
10.0000 mL | Freq: Once | INTRAVENOUS | Status: AC
  Administered 2022-11-09: 10 mL

## 2022-11-09 MED ORDER — SODIUM CHLORIDE 0.9 % IV SOLN
160.0000 mg/m2 | Freq: Once | INTRAVENOUS | Status: AC
  Administered 2022-11-09: 300 mg via INTRAVENOUS
  Filled 2022-11-09: qty 15

## 2022-11-09 MED ORDER — SODIUM CHLORIDE 0.9 % IV SOLN
10.0000 mg | Freq: Once | INTRAVENOUS | Status: AC
  Administered 2022-11-09: 10 mg via INTRAVENOUS
  Filled 2022-11-09: qty 10

## 2022-11-09 MED ORDER — PALONOSETRON HCL INJECTION 0.25 MG/5ML
0.2500 mg | Freq: Once | INTRAVENOUS | Status: AC
  Administered 2022-11-09: 0.25 mg via INTRAVENOUS
  Filled 2022-11-09: qty 5

## 2022-11-09 MED ORDER — ATROPINE SULFATE 1 MG/ML IV SOLN
0.5000 mg | Freq: Once | INTRAVENOUS | Status: AC | PRN
  Administered 2022-11-09: 0.5 mg via INTRAVENOUS
  Filled 2022-11-09: qty 1

## 2022-11-09 MED ORDER — SODIUM CHLORIDE 0.9 % IV SOLN: Freq: Once | INTRAVENOUS | Status: AC

## 2022-11-09 NOTE — Progress Notes (Signed)
Jorge [name]  [phone]) [phone] Fax:(336) [phone]   Name: Jorge [name] Date: 11/09/2022 MRN: [id number]  DOB: 11/10/1950  Patient Care Team: Pcp, No as PCP - General Pickenpack-Cousar, [name], NP as Nurse Practitioner (Nurse Practitioner) Jorge [name] as Consulting Physician (Hematology)   INTERVAL HISTORY: Jorge [name] is a 72 y.o. 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  Palliative ask to see for symptom management and goals of care.   SOCIAL HISTORY:     reports that he has been smoking cigarettes. He has a 5.00 pack-year smoking history. He has never used smokeless tobacco. He reports that he does not currently use alcohol. He reports that he does not use drugs.  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  MEDICATIONS:  Current Outpatient Medications  Medication Sig Dispense Refill   amLODipine (NORVASC) 5 MG tablet Take 1 tablet (5 mg total) by mouth daily. 30 tablet 2   carbonyl iron (FEOSOL) 45 MG TABS tablet Take 45 mg by mouth daily.     DULoxetine (CYMBALTA) 20 MG capsule Take 1 capsule (20 mg total) by mouth daily. 30 capsule 1   gabapentin (NEURONTIN) 100 MG capsule Take 2 capsules (200 mg total) by mouth 3 (three) times daily. 180 capsule 1   Multiple Vitamin (MULTIVITAMIN WITH MINERALS) TABS tablet Take 1 tablet by mouth daily.     ondansetron (ZOFRAN) 8 MG tablet Take 1 tablet (8 mg total) by mouth every 8 (eight) hours as needed for nausea or vomiting. 30 tablet 0   oxyCODONE (OXY IR/ROXICODONE) 5 MG immediate release tablet Take 1 tablet (5 mg total) by mouth every 8 (eight) hours as needed for severe pain or breakthrough pain. 45 tablet 0   No current  facility-administered medications for this visit.   Facility-Administered Medications Ordered in Other Visits  Medication Dose Route Frequency Provider Last Rate Last Admin   fluorouracil (ADRUCIL) 4,450 mg in sodium chloride 0.9 % 61 mL chemo infusion  2,400 mg/m2 (Treatment Plan Recorded) Intravenous 1 day or 1 dose Jorge [name]   4,450 mg at 11/09/22 1325    VITAL SIGNS: There were no vitals taken for this visit. There were no vitals filed for this visit.   Estimated body mass index is 20.11 kg/m as calculated from the following:   Height as of an earlier encounter on 11/09/22: 6' (1.829 m).   Weight as of an earlier encounter on 11/09/22: 148 lb 4.8 oz (67.3 kg).   PERFORMANCE STATUS (ECOG) : 1 - Symptomatic but completely ambulatory  Assessment NAD, ambulatory RRR Normal breathing pattern  AAO x3, mood appropriate    IMPRESSION: [name] during his infusion today for symptom management follow-up. No acute distress noted. Continues to feel well. Appetite is good. Denies nausea, vomiting, diarrhea, or constipation. Shares he had a good Christmas and enjoyed spending time with his grandchildren.   Neuropathic Pain  [name]'s endorses pain is well controlled on current regimen. Some days are better than others pending level of activity and around treatment days. Much improvement in his neuropathic symptoms allowing him to walk further distance and longer. He is tolerating cymbalta and gabapentin. Oxy IR as needed for severe pain/discomfort.  He is requesting Oxy IR as month supply or increase. I advised due to limitations and safety in prescribing this was not an option. He verbalized understanding. Will continue to notify as needed for refills.   Denies nausea, vomiting, diarrhea, or constipation. Tolerating treatments without difficulty. All questions answered and support provided.   PLAN:  Gabapentin '300mg'$  three times daily.   Cymbalta '20mg'$  daily. Tolerating well.  Oxy  IR '5mg'$  every 8 hours as needed. Not requiring around the clock.  I will plan to see patient back in 3-4 weeks in collaboration with his oncology appointments.   Patient expressed understanding and was in agreement with this plan. He also understands that He can call the clinic at any time with any questions, concerns, or complaints.    Any controlled substances utilized were prescribed in the context of palliative care. PDMP has been reviewed.    Time Total: 20 min   Visit consisted of counseling and education dealing with the complex and emotionally intense issues of symptom management and palliative care in the setting of serious and potentially life-threatening illness.Greater than 50%  of this time was spent counseling and coordinating care related to the above assessment and plan.  [name], AGPCNP-BC  Palliative Medicine Team/[place]

## 2022-11-09 NOTE — Progress Notes (Signed)
[facility]   [phone] Fax:([phone]   Clinic Follow up Note   Patient Care Team: Pcp, No as PCP - General Pickenpack-Cousar, [name], NP as Nurse Practitioner (Nurse Practitioner) [name], MD as Consulting Physician (Hematology)   CHIEF COMPLAINT: Follow-up metastatic rectal cancer  Oncology History Overview Note  Cancer Staging Rectal cancer [facility]) Staging form: Colon and Rectum, AJCC 8th Edition - Clinical stage from 08/29/2021: Jorge [name], cM1 - Signed by [name], MD on 08/29/2021    Rectal cancer ([place])  07/23/2021 Imaging   CT AP  IMPRESSION: 1. Appearance of the rectum likely indicates a rectal mass suspicious for rectal carcinoma. Inflammatory process would be less likely. Direct visualization is suggested. 2. Cholelithiasis without evidence of acute cholecystitis. 3. Aortic atherosclerosis.   08/19/2021 Procedure   Colonoscopy, under Dr. [name]  Impression: - Rectal tenderness, palpable rectal mass and hemorrhoids found on digital rectal exam. - Stool in the entire examined colon - lavaged with still inadequate clearance. - One 35 mm polyp at the hepatic flexure. Biopsied. Tattooed distal in case future endoscopic resection is considered - however, has larger issues with rectal mass currently as below. - Four, 5 to 11 mm polyps in the transverse colon and at the hepatic flexure, removed with a cold snare. Resected and retrieved. - Diverticulosis in the recto-sigmoid colon and in the sigmoid colon. - Rule out malignancy, partially obstructing tumor in the rectum and from 6 to 13 cm proximal to the anus. Biopsied.   08/19/2021 Pathology Results   Diagnosis 1. Hepatic Flexure Biopsy - TUBULAR ADENOMA WITHOUT HIGH-GRADE DYSPLASIA OR MALIGNANCY 2. Transverse Colon Biopsy, and hepatic flexure, polyps (4) - TUBULAR ADENOMA WITHOUT HIGH-GRADE DYSPLASIA OR MALIGNANCY - OTHER FRAGMENTS OF POLYPOID COLONIC MUCOSA WITH NO SPECIFIC  HISTOPATHOLOGIC CHANGES - FOOD MATERIAL 3. Rectum, biopsy - ADENOCARCINOMA. SEE NOTE   08/22/2021 Initial Diagnosis   Rectal cancer ([place])   08/26/2021 Imaging   IMPRESSION: 1. A 2.2 x 1.9 cm right middle lobe pulmonary nodule as well as a total of four right upper lobe pulmonary nodule and masses measuring up to 3.6 cm. Findings concerning for metastatic primary lung cancer versus less likely metastases in a patient with rectal cancer. Additional imaging evaluation or consultation with Pulmonology or Thoracic Surgery recommended. 2. No gross hilar adenopathy, noting limited sensitivity for the detection of hilar adenopathy on this noncontrast study. 3. Cholelithiasis. 4.  Emphysema (ICD10-J43.9). 5. At least left anterior descending coronary artery calcifications.   08/27/2021 Imaging   IMPRESSION: Rectal adenocarcinoma T stage: T4 B   Rectal adenocarcinoma N stage: N2 disease likely associated with early extra mesorectal lymph node involvement.   Distance from tumor to the internal anal sphincter is 1.2 cm.   Also with extramural venous involvement as described.   08/29/2021 Cancer Staging   Staging form: Colon and Rectum, AJCC 8th Edition - Clinical stage from 08/29/2021: Jorge [name], cM1 - Signed by [name], MD on 08/29/2021 Stage prefix: Initial diagnosis   01/23/2022 Imaging   CT CAP w contrast IMPRESSION: 1. Mild interval decrease in size of multiple right-sided pulmonary nodules. No new suspicious pulmonary nodule or mass. 2. Interval development of approximately 10 new ill-defined hypoattenuating liver lesions ranging in size from approximately 5-10 mm. Imaging features suspicious for metastatic disease. MRI abdomen with and without contrast may prove helpful to further evaluate. 3. Similar appearance of wall thickening in the rectum with perirectal edema. 4. Cholelithiasis. 5. Prostatomegaly. 6. Aortic Atherosclerosis (ICD10-I70.0).  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 10/19/2022 and completed cycle 8 FOLFIRI/Beva. He is doing well in general. Neuropathy has improved when he takes gabapentin and oxycodone morning and afternoon. He notices tingling about 6 hours after taking gabapentin. He can ambulate better. 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 Denies any other new side effects or specific complaints.   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 (Jorge [name]) 08/19/2021     Past Surgical History:  Procedure Laterality Date   BRONCHIAL BIOPSY  09/27/2021   Procedure: BRONCHIAL BIOPSIES;  Surgeon: Garner Nash, DO;  Location: [place] [facility];  Service: Pulmonary;;   BRONCHIAL BRUSHINGS  09/27/2021   Procedure: BRONCHIAL BRUSHINGS;  Surgeon: Garner Nash, DO;  Location: [place] [facility];  Service: Pulmonary;;   BRONCHIAL NEEDLE ASPIRATION BIOPSY  09/27/2021   Procedure: BRONCHIAL NEEDLE ASPIRATION BIOPSIES;  Surgeon: Garner Nash, DO;  Location: [place] [facility];  Service: Pulmonary;;   COLONOSCOPY     IR IMAGING GUIDED PORT INSERTION  10/24/2021   left breast surgery Left 1968   VIDEO BRONCHOSCOPY WITH RADIAL ENDOBRONCHIAL ULTRASOUND  09/27/2021   Procedure: RADIAL ENDOBRONCHIAL  ULTRASOUND;  Surgeon: Garner Nash, DO;  Location: [place] [facility];  Service: Pulmonary;;     Outpatient Encounter Medications as of 11/09/2022  Medication Sig Note   amLODipine (NORVASC) 5 MG tablet Take 1 tablet (5 mg total) by mouth daily.    carbonyl iron (FEOSOL) 45 MG TABS tablet Take 45 mg by mouth daily.    DULoxetine (CYMBALTA) 20 MG capsule Take 1 capsule (20 mg total) by mouth daily.    gabapentin (NEURONTIN) 100 MG capsule Take 2 capsules (200 mg total) by mouth 3 (three) times daily.    Multiple Vitamin (MULTIVITAMIN WITH MINERALS) TABS tablet Take 1 tablet by mouth daily.    ondansetron (ZOFRAN) 8 MG tablet Take 1 tablet (8 mg total) by mouth every 8 (eight) hours as needed for nausea or vomiting.    oxyCODONE (OXY IR/ROXICODONE) 5 MG immediate release tablet Take 1 tablet (5 mg total) by mouth every 8 (eight) hours as needed for severe pain or breakthrough pain.    [DISCONTINUED] ondansetron (ZOFRAN) 8 MG tablet Take 1 tablet (8 mg total) by mouth every 8 (eight) hours as needed for nausea or vomiting. 10/24/2021: Not started   [DISCONTINUED] prochlorperazine (COMPAZINE) 10 MG tablet Take 1 tablet (10 mg total) by mouth every 6 (six) hours as needed (Nausea or vomiting). 62/69/4854: duplicate   No facility-administered encounter medications on file as of 11/09/2022.     Today's Vitals   11/09/22 0941  BP: (!) 154/88  Pulse: 69  Resp: 16  Temp: 97.8 F (36.6 C)  TempSrc: Temporal  SpO2: 97%  Weight: 148 lb 4.8 oz (67.3 kg)  Height: 6' (1.829 m)   Body mass index is 20.11 kg/m.   PHYSICAL EXAM GENERAL:alert, no distress and comfortable SKIN: no rash  EYES: sclera clear LUNGS: clear with normal breathing effort HEART: regular rate & rhythm, no lower extremity edema NEURO: alert & oriented x 3 with fluent speech PAC without erythema    CBC    Component Value Date/Time   WBC 3.7 (L) 11/09/2022 0859   WBC 5.4 07/31/2022 1034   RBC 4.65 11/09/2022 0859   HGB 13.4  11/09/2022 0859   HCT 41.2 11/09/2022 0859   PLT 235 11/09/2022 0859   MCV 88.6 11/09/2022 0859   MCH 28.8 11/09/2022 0859   MCHC 32.5 11/09/2022 0859   RDW 16.3 (H) 11/09/2022 0859   LYMPHSABS 0.9 11/09/2022 0859   MONOABS 0.6 11/09/2022 0859   EOSABS 0.1 11/09/2022 0859   BASOSABS 0.1 11/09/2022 0859     CMP     Component Value Date/Time   NA 136 11/09/2022 0859   K 4.4 11/09/2022 0859   CL 103 11/09/2022 0859   CO2 30 11/09/2022 0859   GLUCOSE 103 (H) 11/09/2022 0859   BUN 8 11/09/2022 0859   CREATININE 0.84 11/09/2022 0859   CALCIUM 9.1 11/09/2022 0859   PROT 6.8 11/09/2022 0859   ALBUMIN 3.6 11/09/2022 0859   AST 22 11/09/2022 0859   ALT 16 11/09/2022 0859   ALKPHOS 131 (H) 11/09/2022 0859   BILITOT 0.4 11/09/2022 0859   GFRNONAA >60 11/09/2022 0859     ASSESSMENT & PLAN:Jorge [name] is a 72 y.o. male with    1. Rectal Adenocarcinoma, [phone] with lung metastasis, MMR normal  -Presented with rectal bleeding and frequent bowel movement.  -staging pelvic MRI 08/27/21 showed: staging at T4b; N2 likely associated with early extra mesorectal lymph node involvement; distance from tumor to internal anal sphincter is 1.2 cm; also with extramural venous involvement. -baseline CEA 571.84 on 09/06/21. -he received concurrent chemoRT with Xeloda 11/1-11/21/22 under the care of Dr. [name].  He responded clinically, no recurrent rectal bleeding, his pain has improved  -CEA has decreased on treatment -Lung biopsy 09/27/21 confirmed metastatic rectal cancer.  -S/p first-line CapeOx and bevacizumab 10/14/2021 - 01/25/2022  -Due to new liver lesions he switch to second line FOLFIRI and continued bevacizumab starting 02/21/2022 -Mr. Kettles appears stable.  Continues FOLFIRI/Bev a every 2 weeks, tolerating very well overall.  Neuropathy has improved on gabapentin and oxycodone.  Side effects are well-managed with supportive care at home.  He is able to recover and function well. -There  is no clinical evidence of disease progression -Labs reviewed, adequate to proceed with cycle 9 FOLFIRI/Bev at today at same dose -He is scheduled for restaging on 1/16, then follow-up and next cycle 1/18  2.  CIPN -Improved with gabapentin and oxycodone, managed by palliative care Lexine Baton, NP   PLAN: -Labs reviewed -Proceed with cycle 9 FOLFIRI/beva today as planned, same dose -Palliative care visit today -Restaging 1/16 -F/up and next cycle 1/18    All questions were answered. The patient knows to call the clinic with any problems, questions or concerns. No barriers to learning were detected. Milus Height, NP-C 11/09/2022

## 2022-11-11 ENCOUNTER — Other Ambulatory Visit: Payer: Self-pay

## 2022-11-11 ENCOUNTER — Inpatient Hospital Stay: Payer: Medicare Other

## 2022-11-11 VITALS — BP 144/94 | HR 83 | Temp 99.3°F | Resp 16 | Ht 72.0 in

## 2022-11-11 DIAGNOSIS — Z5112 Encounter for antineoplastic immunotherapy: Secondary | ICD-10-CM | POA: Diagnosis not present

## 2022-11-11 DIAGNOSIS — C7800 Secondary malignant neoplasm of unspecified lung: Secondary | ICD-10-CM

## 2022-11-11 MED ORDER — HEPARIN SOD (PORK) LOCK FLUSH 100 UNIT/ML IV SOLN
500.0000 [IU] | Freq: Once | INTRAVENOUS | Status: AC | PRN
  Administered 2022-11-11: 500 [IU]

## 2022-11-11 MED ORDER — PEGFILGRASTIM-JMDB 6 MG/0.6ML ~~LOC~~ SOSY
6.0000 mg | PREFILLED_SYRINGE | Freq: Once | SUBCUTANEOUS | Status: AC
  Administered 2022-11-11: 6 mg via SUBCUTANEOUS
  Filled 2022-11-11: qty 0.6

## 2022-11-11 MED ORDER — SODIUM CHLORIDE 0.9% FLUSH
10.0000 mL | INTRAVENOUS | Status: DC | PRN
  Administered 2022-11-11: 10 mL

## 2022-11-13 ENCOUNTER — Other Ambulatory Visit: Payer: Self-pay | Admitting: Nurse Practitioner

## 2022-11-13 DIAGNOSIS — C20 Malignant neoplasm of rectum: Secondary | ICD-10-CM

## 2022-11-13 DIAGNOSIS — G893 Neoplasm related pain (acute) (chronic): Secondary | ICD-10-CM

## 2022-11-13 DIAGNOSIS — Z515 Encounter for palliative care: Secondary | ICD-10-CM

## 2022-11-13 DIAGNOSIS — M792 Neuralgia and neuritis, unspecified: Secondary | ICD-10-CM

## 2022-11-13 MED ORDER — OXYCODONE HCL 5 MG PO TABS: 5.0000 mg | ORAL_TABLET | Freq: Three times a day (TID) | ORAL | 0 refills | Status: DC | PRN

## 2022-11-17 ENCOUNTER — Other Ambulatory Visit (HOSPITAL_COMMUNITY): Payer: Self-pay

## 2022-11-21 ENCOUNTER — Ambulatory Visit (HOSPITAL_COMMUNITY)
Admission: RE | Admit: 2022-11-21 | Discharge: 2022-11-21 | Disposition: A | Discharge status: Home / Self Care | Payer: Medicare Other | Source: Ambulatory Visit | Attending: Hematology | Admitting: Hematology

## 2022-11-21 DIAGNOSIS — C20 Malignant neoplasm of rectum: Secondary | ICD-10-CM | POA: Diagnosis present

## 2022-11-21 MED ORDER — IOHEXOL 300 MG/ML  SOLN
100.0000 mL | Freq: Once | INTRAMUSCULAR | Status: AC | PRN
  Administered 2022-11-21: 100 mL via INTRAVENOUS

## 2022-11-21 MED ORDER — HEPARIN SOD (PORK) LOCK FLUSH 100 UNIT/ML IV SOLN
INTRAVENOUS | Status: AC
  Administered 2022-11-21: 500 [IU]
  Filled 2022-11-21: qty 5

## 2022-11-21 MED ORDER — SODIUM CHLORIDE (PF) 0.9 % IJ SOLN
INTRAMUSCULAR | Status: AC
  Filled 2022-11-21: qty 50

## 2022-11-21 NOTE — Progress Notes (Signed)
Jorge [name]   Telephone:(336) [phone] Fax:(336) [phone]   Clinic Follow up Note   Patient Care Team: Pcp, No as PCP - General Pickenpack-Cousar, [name], NP as Nurse Practitioner (Nurse Practitioner) [name], MD as Consulting Physician (Hematology)  Date of Service:  11/24/2022  CHIEF COMPLAINT: f/u of metastatic rectal cancer   CURRENT THERAPY:  Second-line FOLFIRI, q2weeks, started 02/22/22 -Bevacizumab started 11/24/21   ASSESSMENT:  Jorge [name] is a 72 y.o. male with   Rectal cancer ([place]) -[id number] with lung metastasis, MMR normal, KRAS(+) [id number]  -diagnosed 08/2021 by colonoscopy for rectal bleeding and frequent BM. Staging MRI showed early extra mesorectal lymph node involvement.  -baseline CEA 571.84 on 09/06/21.  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 Xeloda dose reduced due to elevated liver enzymes.  -due to new liver lesions, we switched to second-line FOLFIRI, with continued beva, on 02/21/22. He tolerates well overall.  -I reviewed his recent restaging CT scan images, which showed stable lung and liver metastasis, no new lesions.  I discussed with patient -Will continue current chemotherapy.  We also discussed maintenance irinotecan along, since he is tolerating FOLFIRI well, will continue for now.  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 He is on gabapentin and cymbalta, taking 2-3 oxycodone a day. He is followed by NP Lexine Baton.       PLAN: -I refill amlodipine,Lomitol -lab reviewed. -proceed with C10 FOLFIRI/beva today  -lab,f/u and chemo in 2 wks   SUMMARY OF ONCOLOGIC HISTORY: Oncology History Overview Note  Cancer Staging Rectal cancer [facility]) Staging form: Colon and Rectum, AJCC 8th Edition - Clinical stage from 08/29/2021: [name], cM1 - Signed  by [name], MD on 08/29/2021    Rectal cancer ([place])  07/23/2021 Imaging   CT AP  IMPRESSION: 1. Appearance of the rectum likely indicates a rectal mass suspicious for rectal carcinoma. Inflammatory process would be less likely. Direct visualization is suggested. 2. Cholelithiasis without evidence of acute cholecystitis. 3. Aortic atherosclerosis.   08/19/2021 Procedure   Colonoscopy, under Dr. [name]  Impression: - Rectal tenderness, palpable rectal mass and hemorrhoids found on digital rectal exam. - Stool in the entire examined colon - lavaged with still inadequate clearance. - One 35 mm polyp at the hepatic flexure. Biopsied. Tattooed distal in case future endoscopic resection is considered - however, has larger issues with rectal mass currently as below. - Four, 5 to 11 mm polyps in the transverse colon and at the hepatic flexure, removed with a cold snare. Resected and retrieved. - Diverticulosis in the recto-sigmoid colon and in the sigmoid colon. - Rule out malignancy, partially obstructing tumor in the rectum and from 6 to 13 cm proximal to the anus. Biopsied.   08/19/2021 Pathology Results   Diagnosis 1. Hepatic Flexure Biopsy - TUBULAR ADENOMA WITHOUT HIGH-GRADE DYSPLASIA OR MALIGNANCY 2. Transverse Colon Biopsy, and hepatic flexure, polyps (4) - TUBULAR ADENOMA WITHOUT HIGH-GRADE DYSPLASIA OR MALIGNANCY - OTHER FRAGMENTS OF POLYPOID COLONIC MUCOSA WITH NO SPECIFIC HISTOPATHOLOGIC CHANGES - FOOD MATERIAL 3. Rectum, biopsy - ADENOCARCINOMA. SEE NOTE   08/22/2021 Initial Diagnosis   Rectal cancer ([place])   08/26/2021 Imaging   IMPRESSION: 1. A 2.2 x 1.9 cm right middle lobe pulmonary nodule as well as a total of four right upper lobe pulmonary nodule and masses measuring up to 3.6 cm. Findings concerning for metastatic primary lung  cancer versus less likely metastases in a patient with rectal cancer. Additional imaging evaluation or consultation with Pulmonology or  Thoracic Surgery recommended. 2. No gross hilar adenopathy, noting limited sensitivity for the detection of hilar adenopathy on this noncontrast study. 3. Cholelithiasis. 4.  Emphysema (ICD10-J43.9). 5. At least left anterior descending coronary artery calcifications.   08/27/2021 Imaging   IMPRESSION: Rectal adenocarcinoma T stage: T4 B   Rectal adenocarcinoma N stage: N2 disease likely associated with early extra mesorectal lymph node involvement.   Distance from tumor to the internal anal sphincter is 1.2 cm.   Also with extramural venous involvement as described.   08/29/2021 Cancer Staging   Staging form: Colon and Rectum, AJCC 8th Edition - Clinical stage from 08/29/2021: [name], cM1 - Signed by [name], MD on 08/29/2021 Stage prefix: Initial diagnosis   01/23/2022 Imaging   CT CAP w contrast IMPRESSION: 1. Mild interval decrease in size of multiple right-sided pulmonary nodules. No new suspicious pulmonary nodule or mass. 2. Interval development of approximately 10 new ill-defined hypoattenuating liver lesions ranging in size from approximately 5-10 mm. Imaging features suspicious for metastatic disease. MRI abdomen with and without contrast may prove helpful to further evaluate. 3. Similar appearance of wall thickening in the rectum with perirectal edema. 4. Cholelithiasis. 5. Prostatomegaly. 6. Aortic Atherosclerosis (ICD10-I70.0).   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 Pt request a refill on BP medication. Pt reports pain level is good. Pt also states he has been experiencing some diarrhea  and some nausea, 3 days after chemo and has been taking imodium for diarrhea. Pt states his daughter lost her cr and he has a hard time getting here.      All other systems were reviewed with the patient and are negative.  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 ([place]) 08/19/2021    SURGICAL HISTORY: Past Surgical History:  Procedure Laterality Date   BRONCHIAL BIOPSY  09/27/2021   Procedure: BRONCHIAL BIOPSIES;  Surgeon: Garner Nash, DO;  Location: [place] [facility];  Service: Pulmonary;;   BRONCHIAL BRUSHINGS  09/27/2021   Procedure: BRONCHIAL BRUSHINGS;  Surgeon: Garner Nash, DO;  Location: [place] [facility];  Service: Pulmonary;;   BRONCHIAL NEEDLE ASPIRATION BIOPSY  09/27/2021   Procedure: BRONCHIAL NEEDLE ASPIRATION BIOPSIES;  Surgeon: Garner Nash, DO;  Location: [place];  Service: Pulmonary;;   COLONOSCOPY     IR IMAGING GUIDED PORT INSERTION  10/24/2021   left breast surgery Left 1968   VIDEO BRONCHOSCOPY WITH RADIAL ENDOBRONCHIAL ULTRASOUND  09/27/2021   Procedure: RADIAL ENDOBRONCHIAL ULTRASOUND;  Surgeon: Garner Nash, DO;  Location: [place];  Service: Pulmonary;;    I have reviewed the social history and family history with the patient and they are unchanged from  previous note.  ALLERGIES:  has No Known Allergies.  MEDICATIONS:  Current Outpatient Medications  Medication Sig Dispense Refill   amLODipine (NORVASC) 10 MG tablet Take 1 tablet (10 mg total) by mouth daily. 30 tablet 1   diphenoxylate-atropine (LOMOTIL) 2.5-0.025 MG tablet Take 1-2 tablets by mouth 4 (four) times daily as needed for diarrhea or loose stools. 60 tablet 0   carbonyl iron (FEOSOL) 45 MG  TABS tablet Take 45 mg by mouth daily.     DULoxetine (CYMBALTA) 20 MG capsule Take 1 capsule (20 mg total) by mouth daily. 30 capsule 1   gabapentin (NEURONTIN) 100 MG capsule Take 2 capsules (200 mg total) by mouth 3 (three) times daily. 180 capsule 1   Multiple Vitamin (MULTIVITAMIN WITH MINERALS) TABS tablet Take 1 tablet by mouth daily.     ondansetron (ZOFRAN) 8 MG tablet Take 1 tablet (8 mg total) by mouth every 8 (eight) hours as needed for nausea or vomiting. 30 tablet 0   oxyCODONE (OXY IR/ROXICODONE) 5 MG immediate release tablet Take 1 tablet (5 mg total) by mouth every 8 (eight) hours as needed for severe pain or breakthrough pain. 45 tablet 0   No current facility-administered medications for this visit.    PHYSICAL EXAMINATION: ECOG PERFORMANCE STATUS: 1 - Symptomatic but completely ambulatory  Vitals:   11/23/22 0928  BP: (!) 153/81  Pulse: 72  Resp: 16  Temp: 98.3 F (36.8 C)  SpO2: 100%   Wt Readings from Last 3 Encounters:  11/23/22 148 lb 14.4 oz (67.5 kg)  11/09/22 148 lb 4.8 oz (67.3 kg)  10/19/22 151 lb 1.6 oz (68.5 kg)     GENERAL:alert, no distress and comfortable SKIN: skin color normal, no rashes or significant lesions EYES: normal, Conjunctiva are pink and non-injected, sclera clear  NEURO: alert & oriented x 3 with fluent speech  LABORATORY DATA:  I have reviewed the data as listed    Latest Ref Rng & Units 11/23/2022    9:07 AM 11/09/2022    8:59 AM 10/19/2022    9:37 AM  CBC  WBC 4.0 - 10.5 K/uL 5.5  3.7  6.5   Hemoglobin 13.0 - 17.0 g/dL 13.2  13.4  13.4   Hematocrit 39.0 - 52.0 % 41.2  41.2  41.6   Platelets 150 - 400 K/uL 185  235  154         Latest Ref Rng & Units 11/23/2022    9:07 AM 11/09/2022    8:59 AM 10/19/2022    9:37 AM  CMP  Glucose 70 - 99 mg/dL 90  103  106   BUN 8 - 23 mg/dL '10  8  12   '$ Creatinine 0.61 - 1.24 mg/dL 0.85  0.84  0.93   Sodium 135 - 145 mmol/L 140  136  135   Potassium 3.5 - 5.1 mmol/L 4.0  4.4  4.1    Chloride 98 - 111 mmol/L 105  103  102   CO2 22 - 32 mmol/L '31  30  30   '$ Calcium 8.9 - 10.3 mg/dL 9.0  9.1  8.5   Total Protein 6.5 - 8.1 g/dL 6.3  6.8  6.5   Total Bilirubin 0.3 - 1.2 mg/dL 0.4  0.4  0.7   Alkaline Phos 38 - 126 U/L 136  131  119   AST 15 - 41 U/L '18  22  22   '$ ALT 0 - 44 U/L 13  16  14  RADIOGRAPHIC STUDIES: I have personally reviewed the radiological images as listed and agreed with the findings in the report. No results found.    Orders Placed This Encounter  Procedures   CBC with Differential ([place] Only)    Standing Status:   Future    Standing Expiration Date:   12/08/2023   CMP ([place] only)    Standing Status:   Future    Standing Expiration Date:   12/08/2023   Total Protein, Urine dipstick    Standing Status:   Future    Standing Expiration Date:   12/08/2023   CBC with Differential ([place] Only)    Standing Status:   Future    Standing Expiration Date:   12/22/2023   CMP ([place] only)    Standing Status:   Future    Standing Expiration Date:   12/22/2023   Total Protein, Urine dipstick    Standing Status:   Future    Standing Expiration Date:   12/22/2023   All questions were answered. The patient knows to call the clinic with any problems, questions or concerns. No barriers to learning was detected. The total time spent in the appointment was 30 minutes.     [name], MD 11/24/2022   Felicity Coyer, CMA, am acting as scribe for [name], MD.   I have reviewed the above documentation for accuracy and completeness, and I agree with the above.

## 2022-11-23 ENCOUNTER — Other Ambulatory Visit: Payer: Self-pay

## 2022-11-23 ENCOUNTER — Inpatient Hospital Stay (HOSPITAL_BASED_OUTPATIENT_CLINIC_OR_DEPARTMENT_OTHER): Payer: Medicare Other | Admitting: Hematology

## 2022-11-23 ENCOUNTER — Encounter: Payer: Self-pay | Admitting: Hematology

## 2022-11-23 ENCOUNTER — Inpatient Hospital Stay: Payer: Medicare Other

## 2022-11-23 VITALS — BP 164/85 | HR 65 | Temp 97.9°F | Resp 20

## 2022-11-23 VITALS — BP 153/81 | HR 72 | Temp 98.3°F | Resp 16 | Ht 72.0 in | Wt 148.9 lb

## 2022-11-23 DIAGNOSIS — Z5112 Encounter for antineoplastic immunotherapy: Secondary | ICD-10-CM | POA: Diagnosis not present

## 2022-11-23 DIAGNOSIS — T451X5A Adverse effect of antineoplastic and immunosuppressive drugs, initial encounter: Secondary | ICD-10-CM | POA: Diagnosis not present

## 2022-11-23 DIAGNOSIS — C20 Malignant neoplasm of rectum: Secondary | ICD-10-CM

## 2022-11-23 DIAGNOSIS — G620 Drug-induced polyneuropathy: Secondary | ICD-10-CM | POA: Diagnosis not present

## 2022-11-23 DIAGNOSIS — C7800 Secondary malignant neoplasm of unspecified lung: Secondary | ICD-10-CM | POA: Diagnosis not present

## 2022-11-23 DIAGNOSIS — Z95828 Presence of other vascular implants and grafts: Secondary | ICD-10-CM

## 2022-11-23 LAB — CBC WITH DIFFERENTIAL (CANCER CENTER ONLY)
Abs Immature Granulocytes: 0.03 10*3/uL (ref 0.00–0.07)
Basophils Absolute: 0 10*3/uL (ref 0.0–0.1)
Basophils Relative: 1 %
Eosinophils Absolute: 0.3 10*3/uL (ref 0.0–0.5)
Eosinophils Relative: 5 %
HCT: 41.2 % (ref 39.0–52.0)
Hemoglobin: 13.2 g/dL (ref 13.0–17.0)
Immature Granulocytes: 1 %
Lymphocytes Relative: 15 %
Lymphs Abs: 0.9 10*3/uL (ref 0.7–4.0)
MCH: 28.4 pg (ref 26.0–34.0)
MCHC: 32 g/dL (ref 30.0–36.0)
MCV: 88.8 fL (ref 80.0–100.0)
Monocytes Absolute: 0.7 10*3/uL (ref 0.1–1.0)
Monocytes Relative: 13 %
Neutro Abs: 3.7 10*3/uL (ref 1.7–7.7)
Neutrophils Relative %: 65 %
Platelet Count: 185 10*3/uL (ref 150–400)
RBC: 4.64 MIL/uL (ref 4.22–5.81)
RDW: 17.2 % — ABNORMAL HIGH (ref 11.5–15.5)
WBC Count: 5.5 10*3/uL (ref 4.0–10.5)
nRBC: 0 % (ref 0.0–0.2)

## 2022-11-23 LAB — CMP (CANCER CENTER ONLY)
ALT: 13 U/L (ref 0–44)
AST: 18 U/L (ref 15–41)
Albumin: 3.3 g/dL — ABNORMAL LOW (ref 3.5–5.0)
Alkaline Phosphatase: 136 U/L — ABNORMAL HIGH (ref 38–126)
Anion gap: 4 — ABNORMAL LOW (ref 5–15)
BUN: 10 mg/dL (ref 8–23)
CO2: 31 mmol/L (ref 22–32)
Calcium: 9 mg/dL (ref 8.9–10.3)
Chloride: 105 mmol/L (ref 98–111)
Creatinine: 0.85 mg/dL (ref 0.61–1.24)
GFR, Estimated: >60 mL/min (ref >60)
Glucose, Bld: 90 mg/dL (ref 70–99)
Potassium: 4 mmol/L (ref 3.5–5.1)
Sodium: 140 mmol/L (ref 135–145)
Total Bilirubin: 0.4 mg/dL (ref 0.3–1.2)
Total Protein: 6.3 g/dL — ABNORMAL LOW (ref 6.5–8.1)

## 2022-11-23 LAB — TOTAL PROTEIN, URINE DIPSTICK: Protein, ur: 30 mg/dL — AB

## 2022-11-23 MED ORDER — SODIUM CHLORIDE 0.9 % IV SOLN
10.0000 mg | Freq: Once | INTRAVENOUS | Status: AC
  Administered 2022-11-23: 10 mg via INTRAVENOUS
  Filled 2022-11-23: qty 10

## 2022-11-23 MED ORDER — AMLODIPINE BESYLATE 10 MG PO TABS: 10.0000 mg | ORAL_TABLET | Freq: Every day | ORAL | 1 refills | Status: DC

## 2022-11-23 MED ORDER — PALONOSETRON HCL INJECTION 0.25 MG/5ML
0.2500 mg | Freq: Once | INTRAVENOUS | Status: AC
  Administered 2022-11-23: 0.25 mg via INTRAVENOUS
  Filled 2022-11-23: qty 5

## 2022-11-23 MED ORDER — SODIUM CHLORIDE 0.9 % IV SOLN
5.0000 mg/kg | Freq: Once | INTRAVENOUS | Status: AC
  Administered 2022-11-23: 350 mg via INTRAVENOUS
  Filled 2022-11-23: qty 14

## 2022-11-23 MED ORDER — SODIUM CHLORIDE 0.9 % IV SOLN
400.0000 mg/m2 | Freq: Once | INTRAVENOUS | Status: AC
  Administered 2022-11-23: 740 mg via INTRAVENOUS
  Filled 2022-11-23: qty 37

## 2022-11-23 MED ORDER — SODIUM CHLORIDE 0.9% FLUSH: 10.0000 mL | INTRAVENOUS | Status: DC | PRN

## 2022-11-23 MED ORDER — SODIUM CHLORIDE 0.9 % IV SOLN
2400.0000 mg/m2 | INTRAVENOUS | Status: DC
  Administered 2022-11-23: 4450 mg via INTRAVENOUS
  Filled 2022-11-23: qty 89

## 2022-11-23 MED ORDER — DIPHENOXYLATE-ATROPINE 2.5-0.025 MG PO TABS: 1.0000 | ORAL_TABLET | Freq: Four times a day (QID) | ORAL | 0 refills | Status: DC | PRN

## 2022-11-23 MED ORDER — SODIUM CHLORIDE 0.9% FLUSH
10.0000 mL | Freq: Once | INTRAVENOUS | Status: AC
  Administered 2022-11-23: 10 mL

## 2022-11-23 MED ORDER — HEPARIN SOD (PORK) LOCK FLUSH 100 UNIT/ML IV SOLN: 500.0000 [IU] | Freq: Once | INTRAVENOUS | Status: DC | PRN

## 2022-11-23 MED ORDER — ATROPINE SULFATE 1 MG/ML IV SOLN
0.5000 mg | Freq: Once | INTRAVENOUS | Status: AC | PRN
  Administered 2022-11-23: 0.5 mg via INTRAVENOUS
  Filled 2022-11-23: qty 1

## 2022-11-23 MED ORDER — SODIUM CHLORIDE 0.9 % IV SOLN: Freq: Once | INTRAVENOUS | Status: AC

## 2022-11-23 MED ORDER — SODIUM CHLORIDE 0.9 % IV SOLN
160.0000 mg/m2 | Freq: Once | INTRAVENOUS | Status: AC
  Administered 2022-11-23: 300 mg via INTRAVENOUS
  Filled 2022-11-23: qty 15

## 2022-11-23 NOTE — Assessment & Plan Note (Addendum)
-  [id number] with lung metastasis, MMR normal, KRAS(+) [id number]  -diagnosed 08/2021 by colonoscopy for rectal bleeding and frequent BM. Staging MRI showed early extra mesorectal lymph node involvement.  -baseline CEA 571.84 on 09/06/21.  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 Xeloda dose reduced due to elevated liver enzymes.  -due to new liver lesions, we switched to second-line FOLFIRI, with continued beva, on 02/21/22. He tolerates well overall.  -I reviewed his recent restaging CT scan images, which showed stable lung and liver metastasis, no new lesions.  I discussed with patient -Will continue current chemotherapy.  We also discussed maintenance irinotecan along, since he is tolerating FOLFIRI well, will continue for now.

## 2022-11-23 NOTE — Assessment & Plan Note (Signed)
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 He is on gabapentin and cymbalta, taking 2-3 oxycodone a day. He is followed by NP Lexine Baton.

## 2022-11-23 NOTE — Patient Instructions (Addendum)
[facility]  Discharge Instructions: Thank you for choosing [facility] to provide your oncology and hematology care.   If you have a lab appointment with the [facility], please go directly to the [facility] and check in at the registration area.   Wear comfortable clothing and clothing appropriate for easy access to any Portacath or PICC line.   We strive to give you quality time with your provider. You may need to reschedule your appointment if you arrive late (15 or more minutes).  Arriving late affects you and other patients whose appointments are after yours.  Also, if you miss three or more appointments without notifying the office, you may be dismissed from the clinic at the provider's discretion.      For prescription refill requests, have your pharmacy contact our office and allow 72 hours for refills to be completed.    Today you received the following chemotherapy and/or immunotherapy agents: Bevacizumab, Irinotecan, Fluorouracil.   To help prevent nausea and vomiting after your treatment, we encourage you to take your nausea medication as directed.  BELOW ARE SYMPTOMS THAT SHOULD BE REPORTED IMMEDIATELY: *FEVER GREATER THAN 100.4 F (38 C) OR HIGHER *CHILLS OR SWEATING *NAUSEA AND VOMITING THAT IS NOT CONTROLLED WITH YOUR NAUSEA MEDICATION *UNUSUAL SHORTNESS OF BREATH *UNUSUAL BRUISING OR BLEEDING *URINARY PROBLEMS (pain or burning when urinating, or frequent urination) *BOWEL PROBLEMS (unusual diarrhea, constipation, pain near the anus) TENDERNESS IN MOUTH AND THROAT WITH OR WITHOUT PRESENCE OF ULCERS (sore throat, sores in mouth, or a toothache) UNUSUAL RASH, SWELLING OR PAIN  UNUSUAL VAGINAL DISCHARGE OR ITCHING   Items with * indicate a potential emergency and should be followed up as soon as possible or go to the Emergency Department if any problems should occur.  Please show the CHEMOTHERAPY ALERT CARD or IMMUNOTHERAPY  ALERT CARD at check-in to the Emergency Department and triage nurse.  Should you have questions after your visit or need to cancel or reschedule your appointment, please contact [facility]  Dept: [phone]  and follow the prompts.  Office hours are 8:00 a.m. to 4:30 p.m. Monday - Friday. Please note that voicemails left after 4:00 p.m. may not be returned until the following business day.  We are closed weekends and major holidays. You have access to a nurse at all times for urgent questions. Please call the main number to the clinic Dept: [phone] and follow the prompts.   For any non-urgent questions, you may also contact your provider using MyChart. We now offer e-Visits for anyone 3 and older to request care online for non-urgent symptoms. For details visit [email or web address].   Also download the MyChart app! Go to the app store, search "MyChart", open the app, select , and log in with your MyChart username and password.  The chemotherapy medication bag should finish at 46 hours, 96 hours, or 7 days. For example, if your pump is scheduled for 46 hours and it was put on at 4:00 p.m., it should finish at 2:00 p.m. the day it is scheduled to come off regardless of your appointment time.     Estimated time to finish at: 11:40   If the display on your pump reads "Low Volume" and it is beeping, take the batteries out of the pump and come to the cancer center for it to be taken off.   If the pump alarms go off prior to the pump reading "Low Volume" then  call [phone] and someone can assist you.  If the plunger comes out and the chemotherapy medication is leaking out, please use your home chemo spill kit to clean up the spill. Do NOT use paper towels or other household products.  If you have problems or questions regarding your pump, please call either [phone] (24 hours a day) or the cancer center Monday-Friday 8:00 a.m.- 4:30 p.m. at  the clinic number and we will assist you. If you are unable to get assistance, then go to the nearest Emergency Department and ask the staff to contact the IV team for assistance.

## 2022-11-24 ENCOUNTER — Telehealth: Payer: Self-pay | Admitting: Hematology

## 2022-11-24 ENCOUNTER — Encounter: Payer: Self-pay | Admitting: Hematology

## 2022-11-24 NOTE — Telephone Encounter (Signed)
Spoke with patient daughter confirming all upcoming appointments

## 2022-11-25 ENCOUNTER — Inpatient Hospital Stay: Payer: Medicare Other

## 2022-11-25 VITALS — BP 146/82 | HR 69 | Temp 98.2°F | Resp 18

## 2022-11-25 DIAGNOSIS — C20 Malignant neoplasm of rectum: Secondary | ICD-10-CM

## 2022-11-25 DIAGNOSIS — Z5112 Encounter for antineoplastic immunotherapy: Secondary | ICD-10-CM | POA: Diagnosis not present

## 2022-11-25 MED ORDER — SODIUM CHLORIDE 0.9% FLUSH
10.0000 mL | INTRAVENOUS | Status: DC | PRN
  Administered 2022-11-25: 10 mL

## 2022-11-25 MED ORDER — HEPARIN SOD (PORK) LOCK FLUSH 100 UNIT/ML IV SOLN
500.0000 [IU] | Freq: Once | INTRAVENOUS | Status: AC | PRN
  Administered 2022-11-25: 500 [IU]

## 2022-11-25 MED ORDER — PEGFILGRASTIM-JMDB 6 MG/0.6ML ~~LOC~~ SOSY
6.0000 mg | PREFILLED_SYRINGE | Freq: Once | SUBCUTANEOUS | Status: AC
  Administered 2022-11-25: 6 mg via SUBCUTANEOUS
  Filled 2022-11-25: qty 0.6

## 2022-11-27 ENCOUNTER — Other Ambulatory Visit: Payer: Self-pay | Admitting: Nurse Practitioner

## 2022-11-27 DIAGNOSIS — M792 Neuralgia and neuritis, unspecified: Secondary | ICD-10-CM

## 2022-11-27 DIAGNOSIS — Z515 Encounter for palliative care: Secondary | ICD-10-CM

## 2022-11-27 DIAGNOSIS — C20 Malignant neoplasm of rectum: Secondary | ICD-10-CM

## 2022-11-27 DIAGNOSIS — G893 Neoplasm related pain (acute) (chronic): Secondary | ICD-10-CM

## 2022-11-27 MED ORDER — OXYCODONE HCL 5 MG PO TABS: 5.0000 mg | ORAL_TABLET | Freq: Three times a day (TID) | ORAL | 0 refills | Status: DC | PRN

## 2022-11-27 NOTE — Telephone Encounter (Signed)
From: [name] To: Jobe Gibbon, NP Sent: 11/26/2022 8:44 PM EST Subject: Medication Renewal Request  Refills have been requested for the following medications:   oxyCODONE (OXY IR/ROXICODONE) 5 MG immediate release tablet Jorge [name]]  Preferred pharmacy: [id number] [place] [address]: [employer]

## 2022-11-28 ENCOUNTER — Other Ambulatory Visit: Payer: Self-pay | Admitting: Nurse Practitioner

## 2022-12-04 NOTE — Assessment & Plan Note (Deleted)
-  [id number] with lung metastasis, MMR normal, KRAS(+) [id number]  -diagnosed 08/2021 by colonoscopy for rectal bleeding and frequent BM. Staging MRI showed early extra mesorectal lymph node involvement.  -baseline CEA 571.84 on 09/06/21.  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 Xeloda dose reduced due to elevated liver enzymes.  -due to new liver lesions, we switched to second-line FOLFIRI, with continued beva, on 02/21/22. He tolerates well overall.  -I reviewed his recent restaging CT scan images, which showed stable lung and liver metastasis, no new lesions.  I discussed with patient -Will continue current chemotherapy.  We also discussed maintenance irinotecan along, since he is tolerating FOLFIRI well, will continue for now.

## 2022-12-04 NOTE — Assessment & Plan Note (Deleted)
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 He is on gabapentin and cymbalta, taking 2-3 oxycodone a day. He is followed by NP Lexine Baton.

## 2022-12-05 NOTE — Progress Notes (Deleted)
[facility]   [phone] [phone]   Clinic Follow up Note   Patient Care Team: Pcp, No as PCP - General Pickenpack-Cousar, [name], NP as Nurse Practitioner (Nurse Practitioner) [name], MD as Consulting Physician (Hematology)  Date of Service:  12/05/2022  CHIEF COMPLAINT: f/u of  metastatic rectal cancer    CURRENT THERAPY:   Second-line FOLFIRI, q2weeks, started 02/22/22 -Bevacizumab started 11/24/21   ASSESSMENT: *** Jorge [name] is a 72 y.o. male with   No problem-specific Assessment & Plan notes found for this encounter.  ***   PLAN: {Everything [name] talks to pt about, including reviewing scans and labs. } -{proceed with ***} -{lab with/without flush and f/u when?}   SUMMARY OF ONCOLOGIC HISTORY: Oncology History Overview Note  Cancer Staging Rectal cancer [facility]) Staging form: Colon and Rectum, AJCC 8th Edition - Clinical stage from 08/29/2021: Jorge [name], cM1 - Signed by [name], MD on 08/29/2021    Rectal cancer ([facility])  07/23/2021 Imaging   CT AP  IMPRESSION: 1. Appearance of the rectum likely indicates a rectal mass suspicious for rectal carcinoma. Inflammatory process would be less likely. Direct visualization is suggested. 2. Cholelithiasis without evidence of acute cholecystitis. 3. Aortic atherosclerosis.   08/19/2021 Procedure   Colonoscopy, under Dr. [name]  Impression: - Rectal tenderness, palpable rectal mass and hemorrhoids found on digital rectal exam. - Stool in the entire examined colon - lavaged with still inadequate clearance. - One 35 mm polyp at the hepatic flexure. Biopsied. Tattooed distal in case future endoscopic resection is considered - however, has larger issues with rectal mass currently as below. - Four, 5 to 11 mm polyps in the transverse colon and at the hepatic flexure, removed with a cold snare. Resected and retrieved. - Diverticulosis in the recto-sigmoid colon and in  the sigmoid colon. - Rule out malignancy, partially obstructing tumor in the rectum and from 6 to 13 cm proximal to the anus. Biopsied.   08/19/2021 Pathology Results   Diagnosis 1. Hepatic Flexure Biopsy - TUBULAR ADENOMA WITHOUT HIGH-GRADE DYSPLASIA OR MALIGNANCY 2. Transverse Colon Biopsy, and hepatic flexure, polyps (4) - TUBULAR ADENOMA WITHOUT HIGH-GRADE DYSPLASIA OR MALIGNANCY - OTHER FRAGMENTS OF POLYPOID COLONIC MUCOSA WITH NO SPECIFIC HISTOPATHOLOGIC CHANGES - FOOD MATERIAL 3. Rectum, biopsy - ADENOCARCINOMA. SEE NOTE   08/22/2021 Initial Diagnosis   Rectal cancer ([facility])   08/26/2021 Imaging   IMPRESSION: 1. A 2.2 x 1.9 cm right middle lobe pulmonary nodule as well as a total of four right upper lobe pulmonary nodule and masses measuring up to 3.6 cm. Findings concerning for metastatic primary lung cancer versus less likely metastases in a patient with rectal cancer. Additional imaging evaluation or consultation with Pulmonology or Thoracic Surgery recommended. 2. No gross hilar adenopathy, noting limited sensitivity for the detection of hilar adenopathy on this noncontrast study. 3. Cholelithiasis. 4.  Emphysema (ICD10-J43.9). 5. At least left anterior descending coronary artery calcifications.   08/27/2021 Imaging   IMPRESSION: Rectal adenocarcinoma T stage: T4 B   Rectal adenocarcinoma N stage: N2 disease likely associated with early extra mesorectal lymph node involvement.   Distance from tumor to the internal anal sphincter is 1.2 cm.   Also with extramural venous involvement as described.   08/29/2021 Cancer Staging   Staging form: Colon and Rectum, AJCC 8th Edition - Clinical stage from 08/29/2021: Jorge [name], cM1 - Signed by [name], MD on 08/29/2021 Stage prefix: Initial diagnosis   01/23/2022 Imaging   CT  CAP w contrast IMPRESSION: 1. Mild interval decrease in size of multiple right-sided pulmonary nodules. No new suspicious pulmonary nodule or  mass. 2. Interval development of approximately 10 new ill-defined hypoattenuating liver lesions ranging in size from approximately 5-10 mm. Imaging features suspicious for metastatic disease. MRI abdomen with and without contrast may prove helpful to further evaluate. 3. Similar appearance of wall thickening in the rectum with perirectal edema. 4. Cholelithiasis. 5. Prostatomegaly. 6. Aortic Atherosclerosis (ICD10-I70.0).   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 ([facility]) 08/19/2021    SURGICAL HISTORY: Past Surgical History:  Procedure Laterality Date   BRONCHIAL BIOPSY  09/27/2021   Procedure: BRONCHIAL BIOPSIES;  Surgeon: Garner Nash, DO;  Location: [facility];  Service: Pulmonary;;   BRONCHIAL BRUSHINGS  09/27/2021   Procedure: BRONCHIAL BRUSHINGS;  Surgeon: Garner Nash, DO;  Location: [facility];  Service: Pulmonary;;   BRONCHIAL NEEDLE ASPIRATION BIOPSY  09/27/2021   Procedure: BRONCHIAL NEEDLE ASPIRATION BIOPSIES;  Surgeon: Garner Nash, DO;  Location: [facility];  Service: Pulmonary;;   COLONOSCOPY     IR IMAGING GUIDED PORT INSERTION  10/24/2021   left breast surgery Left 1968   VIDEO  BRONCHOSCOPY WITH RADIAL ENDOBRONCHIAL ULTRASOUND  09/27/2021   Procedure: RADIAL ENDOBRONCHIAL ULTRASOUND;  Surgeon: Garner Nash, DO;  Location: [facility];  Service: Pulmonary;;    I have reviewed the social history and family history with the patient and they are unchanged from previous note.  ALLERGIES:  has No Known Allergies.  MEDICATIONS:  Current Outpatient Medications  Medication Sig Dispense Refill   amLODipine (NORVASC) 10 MG tablet Take 1 tablet (10 mg total) by mouth daily. 30 tablet 1   carbonyl iron (FEOSOL) 45 MG TABS tablet Take 45 mg by mouth daily.     diphenoxylate-atropine (LOMOTIL) 2.5-0.025 MG tablet Take 1-2 tablets by mouth 4 (four) times daily as needed for diarrhea or loose stools. 60 tablet 0   DULoxetine (CYMBALTA) 20 MG capsule Take 1 capsule (20 mg total) by mouth daily. 30 capsule 1   gabapentin (NEURONTIN) 100 MG capsule Take 2 capsules (200 mg total) by mouth 3 (three) times daily. 180 capsule 1   Multiple Vitamin (MULTIVITAMIN WITH MINERALS) TABS tablet Take 1 tablet by mouth daily.     ondansetron (ZOFRAN) 8 MG tablet Take 1 tablet (8 mg total) by mouth every 8 (eight) hours as needed for nausea or vomiting. 30 tablet 0   oxyCODONE (OXY IR/ROXICODONE) 5 MG immediate release tablet Take 1 tablet (5 mg total) by mouth every 8 (eight) hours as needed for severe pain or breakthrough pain. 45 tablet 0  No current facility-administered medications for this visit.    PHYSICAL EXAMINATION: ECOG PERFORMANCE STATUS: {CHL ONC ECOG PS:[id number]}  There were no vitals filed for this visit. Wt Readings from Last 3 Encounters:  11/23/22 148 lb 14.4 oz (67.5 kg)  11/09/22 148 lb 4.8 oz (67.3 kg)  10/19/22 151 lb 1.6 oz (68.5 kg)    {Only keep what was examined. If exam not performed, can use .CEXAM } GENERAL:alert, no distress and comfortable SKIN: skin color, texture, turgor are normal, no rashes or significant lesions EYES: normal, Conjunctiva are  pink and non-injected, sclera clear {OROPHARYNX:no exudate, no erythema and lips, buccal mucosa, and tongue normal}  NECK: supple, thyroid normal size, non-tender, without nodularity LYMPH:  no palpable lymphadenopathy in the cervical, axillary {or inguinal} LUNGS: clear to auscultation and percussion with normal breathing effort HEART: regular rate & rhythm and no murmurs and no lower extremity edema ABDOMEN:abdomen soft, non-tender and normal bowel sounds Musculoskeletal:no cyanosis of digits and no clubbing  NEURO: alert & oriented x 3 with fluent speech, no focal motor/sensory deficits  LABORATORY DATA:  I have reviewed the data as listed    Latest Ref Rng & Units 11/23/2022    9:07 AM 11/09/2022    8:59 AM 10/19/2022    9:37 AM  CBC  WBC 4.0 - 10.5 K/uL 5.5  3.7  6.5   Hemoglobin 13.0 - 17.0 g/dL 13.2  13.4  13.4   Hematocrit 39.0 - 52.0 % 41.2  41.2  41.6   Platelets 150 - 400 K/uL 185  235  154         Latest Ref Rng & Units 11/23/2022    9:07 AM 11/09/2022    8:59 AM 10/19/2022    9:37 AM  CMP  Glucose 70 - 99 mg/dL 90  103  106   BUN 8 - 23 mg/dL '10  8  12   '$ Creatinine 0.61 - 1.24 mg/dL 0.85  0.84  0.93   Sodium 135 - 145 mmol/L 140  136  135   Potassium 3.5 - 5.1 mmol/L 4.0  4.4  4.1   Chloride 98 - 111 mmol/L 105  103  102   CO2 22 - 32 mmol/L '31  30  30   '$ Calcium 8.9 - 10.3 mg/dL 9.0  9.1  8.5   Total Protein 6.5 - 8.1 g/dL 6.3  6.8  6.5   Total Bilirubin 0.3 - 1.2 mg/dL 0.4  0.4  0.7   Alkaline Phos 38 - 126 U/L 136  131  119   AST 15 - 41 U/L '18  22  22   '$ ALT 0 - 44 U/L '13  16  14       '$ RADIOGRAPHIC STUDIES: I have personally reviewed the radiological images as listed and agreed with the findings in the report. No results found.    No orders of the defined types were placed in this encounter.  All questions were answered. The patient knows to call the clinic with any problems, questions or concerns. No barriers to learning was detected. The total time  spent in the appointment was {CHL ONC TIME VISIT - [id number].     Baldemar Friday, CMA 12/05/2022   I, Audry Riles, CMA, am acting as scribe for [name], MD.   {Add scribe attestation statement}

## 2022-12-06 ENCOUNTER — Inpatient Hospital Stay: Payer: Medicare Other

## 2022-12-06 ENCOUNTER — Telehealth: Payer: Self-pay | Admitting: Hematology

## 2022-12-06 ENCOUNTER — Other Ambulatory Visit: Payer: Self-pay

## 2022-12-06 ENCOUNTER — Inpatient Hospital Stay: Payer: Medicare Other | Admitting: Hematology

## 2022-12-06 DIAGNOSIS — C20 Malignant neoplasm of rectum: Secondary | ICD-10-CM

## 2022-12-06 DIAGNOSIS — Z95828 Presence of other vascular implants and grafts: Secondary | ICD-10-CM

## 2022-12-06 DIAGNOSIS — G620 Drug-induced polyneuropathy: Secondary | ICD-10-CM

## 2022-12-06 DIAGNOSIS — Z5112 Encounter for antineoplastic immunotherapy: Secondary | ICD-10-CM | POA: Diagnosis not present

## 2022-12-06 LAB — CMP (CANCER CENTER ONLY)
ALT: 16 U/L (ref 0–44)
AST: 22 U/L (ref 15–41)
Albumin: 3.4 g/dL — ABNORMAL LOW (ref 3.5–5.0)
Alkaline Phosphatase: 161 U/L — ABNORMAL HIGH (ref 38–126)
Anion gap: 3 — ABNORMAL LOW (ref 5–15)
BUN: 10 mg/dL (ref 8–23)
CO2: 30 mmol/L (ref 22–32)
Calcium: 8.9 mg/dL (ref 8.9–10.3)
Chloride: 104 mmol/L (ref 98–111)
Creatinine: 0.79 mg/dL (ref 0.61–1.24)
GFR, Estimated: >60 mL/min (ref >60)
Glucose, Bld: 84 mg/dL (ref 70–99)
Potassium: 4.4 mmol/L (ref 3.5–5.1)
Sodium: 137 mmol/L (ref 135–145)
Total Bilirubin: 0.3 mg/dL (ref 0.3–1.2)
Total Protein: 6.9 g/dL (ref 6.5–8.1)

## 2022-12-06 LAB — CBC WITH DIFFERENTIAL (CANCER CENTER ONLY)
Abs Immature Granulocytes: 0.05 10*3/uL (ref 0.00–0.07)
Basophils Absolute: 0.1 10*3/uL (ref 0.0–0.1)
Basophils Relative: 1 %
Eosinophils Absolute: 0.4 10*3/uL (ref 0.0–0.5)
Eosinophils Relative: 6 %
HCT: 40.9 % (ref 39.0–52.0)
Hemoglobin: 13.6 g/dL (ref 13.0–17.0)
Immature Granulocytes: 1 %
Lymphocytes Relative: 15 %
Lymphs Abs: 1 10*3/uL (ref 0.7–4.0)
MCH: 29.1 pg (ref 26.0–34.0)
MCHC: 33.3 g/dL (ref 30.0–36.0)
MCV: 87.4 fL (ref 80.0–100.0)
Monocytes Absolute: 0.7 10*3/uL (ref 0.1–1.0)
Monocytes Relative: 11 %
Neutro Abs: 4.3 10*3/uL (ref 1.7–7.7)
Neutrophils Relative %: 66 %
Platelet Count: 186 10*3/uL (ref 150–400)
RBC: 4.68 MIL/uL (ref 4.22–5.81)
RDW: 16.9 % — ABNORMAL HIGH (ref 11.5–15.5)
WBC Count: 6.5 10*3/uL (ref 4.0–10.5)
nRBC: 0 % (ref 0.0–0.2)

## 2022-12-06 LAB — TOTAL PROTEIN, URINE DIPSTICK: Protein, ur: <30 mg/dL — AB

## 2022-12-06 MED ORDER — HEPARIN SOD (PORK) LOCK FLUSH 100 UNIT/ML IV SOLN
500.0000 [IU] | Freq: Once | INTRAVENOUS | Status: AC
  Administered 2022-12-06: 500 [IU]

## 2022-12-06 MED ORDER — SODIUM CHLORIDE 0.9% FLUSH
10.0000 mL | Freq: Once | INTRAVENOUS | Status: AC
  Administered 2022-12-06: 10 mL

## 2022-12-06 MED FILL — Dexamethasone Sodium Phosphate Inj 100 MG/10ML: INTRAMUSCULAR | Qty: 1 | Status: AC

## 2022-12-06 NOTE — Telephone Encounter (Signed)
Patient's daughter called to reschedule missed 1/31 appointments. Patient r/s and will be notified.

## 2022-12-07 ENCOUNTER — Inpatient Hospital Stay: Payer: Medicare Other | Admitting: Nurse Practitioner

## 2022-12-07 ENCOUNTER — Inpatient Hospital Stay: Payer: Medicare Other | Attending: Physician Assistant

## 2022-12-07 ENCOUNTER — Encounter: Payer: Self-pay | Admitting: Nurse Practitioner

## 2022-12-07 ENCOUNTER — Inpatient Hospital Stay (HOSPITAL_BASED_OUTPATIENT_CLINIC_OR_DEPARTMENT_OTHER): Payer: Medicare Other | Admitting: Nurse Practitioner

## 2022-12-07 VITALS — BP 144/66 | HR 74 | Temp 98.2°F | Resp 17 | Wt 149.2 lb

## 2022-12-07 DIAGNOSIS — C775 Secondary and unspecified malignant neoplasm of intrapelvic lymph nodes: Secondary | ICD-10-CM | POA: Insufficient documentation

## 2022-12-07 DIAGNOSIS — C7800 Secondary malignant neoplasm of unspecified lung: Secondary | ICD-10-CM

## 2022-12-07 DIAGNOSIS — Z5189 Encounter for other specified aftercare: Secondary | ICD-10-CM | POA: Insufficient documentation

## 2022-12-07 DIAGNOSIS — C20 Malignant neoplasm of rectum: Secondary | ICD-10-CM | POA: Diagnosis present

## 2022-12-07 DIAGNOSIS — Z79899 Other long term (current) drug therapy: Secondary | ICD-10-CM | POA: Diagnosis not present

## 2022-12-07 DIAGNOSIS — Z5111 Encounter for antineoplastic chemotherapy: Secondary | ICD-10-CM | POA: Insufficient documentation

## 2022-12-07 DIAGNOSIS — C787 Secondary malignant neoplasm of liver and intrahepatic bile duct: Secondary | ICD-10-CM | POA: Insufficient documentation

## 2022-12-07 DIAGNOSIS — Z5112 Encounter for antineoplastic immunotherapy: Secondary | ICD-10-CM | POA: Diagnosis present

## 2022-12-07 MED ORDER — ATROPINE SULFATE 1 MG/ML IV SOLN
0.5000 mg | Freq: Once | INTRAVENOUS | Status: AC
  Administered 2022-12-07: 0.5 mg via INTRAVENOUS
  Filled 2022-12-07: qty 1

## 2022-12-07 MED ORDER — SODIUM CHLORIDE 0.9 % IV SOLN
160.0000 mg/m2 | Freq: Once | INTRAVENOUS | Status: AC
  Administered 2022-12-07: 300 mg via INTRAVENOUS
  Filled 2022-12-07: qty 15

## 2022-12-07 MED ORDER — GABAPENTIN 100 MG PO CAPS: 200.0000 mg | ORAL_CAPSULE | Freq: Three times a day (TID) | ORAL | 1 refills | Status: DC

## 2022-12-07 MED ORDER — SODIUM CHLORIDE 0.9 % IV SOLN: Freq: Once | INTRAVENOUS | Status: AC

## 2022-12-07 MED ORDER — SODIUM CHLORIDE 0.9 % IV SOLN
10.0000 mg | Freq: Once | INTRAVENOUS | Status: AC
  Administered 2022-12-07: 10 mg via INTRAVENOUS
  Filled 2022-12-07: qty 10

## 2022-12-07 MED ORDER — PALONOSETRON HCL INJECTION 0.25 MG/5ML
0.2500 mg | Freq: Once | INTRAVENOUS | Status: AC
  Administered 2022-12-07: 0.25 mg via INTRAVENOUS
  Filled 2022-12-07: qty 5

## 2022-12-07 MED ORDER — SODIUM CHLORIDE 0.9 % IV SOLN
5.0000 mg/kg | Freq: Once | INTRAVENOUS | Status: AC
  Administered 2022-12-07: 350 mg via INTRAVENOUS
  Filled 2022-12-07: qty 14

## 2022-12-07 MED ORDER — DULOXETINE HCL 20 MG PO CPEP: 20.0000 mg | ORAL_CAPSULE | Freq: Every day | ORAL | 1 refills | Status: DC

## 2022-12-07 MED ORDER — SODIUM CHLORIDE 0.9 % IV SOLN
400.0000 mg/m2 | Freq: Once | INTRAVENOUS | Status: AC
  Administered 2022-12-07: 740 mg via INTRAVENOUS
  Filled 2022-12-07: qty 25

## 2022-12-07 MED ORDER — SODIUM CHLORIDE 0.9 % IV SOLN
2400.0000 mg/m2 | INTRAVENOUS | Status: DC
  Administered 2022-12-07: 4450 mg via INTRAVENOUS
  Filled 2022-12-07: qty 89

## 2022-12-07 NOTE — Patient Instructions (Signed)
[facility]  Discharge Instructions: Thank you for choosing [facility] to provide your oncology and hematology care.   If you have a lab appointment with the Cancer Center, please go directly to the Cancer Center and check in at the registration area.   Wear comfortable clothing and clothing appropriate for easy access to any Portacath or PICC line.   We strive to give you quality time with your provider. You may need to reschedule your appointment if you arrive late (15 or more minutes).  Arriving late affects you and other patients whose appointments are after yours.  Also, if you miss three or more appointments without notifying the office, you may be dismissed from the clinic at the provider's discretion.      For prescription refill requests, have your pharmacy contact our office and allow 72 hours for refills to be completed.    Today you received the following chemotherapy and/or immunotherapy agents; Bevacizumab-adcd, Fluorouacil, Irinotecan, & Leucovorin      To help prevent nausea and vomiting after your treatment, we encourage you to take your nausea medication as directed.  BELOW ARE SYMPTOMS THAT SHOULD BE REPORTED IMMEDIATELY: *FEVER GREATER THAN 100.4 F (38 C) OR HIGHER *CHILLS OR SWEATING *NAUSEA AND VOMITING THAT IS NOT CONTROLLED WITH YOUR NAUSEA MEDICATION *UNUSUAL SHORTNESS OF BREATH *UNUSUAL BRUISING OR BLEEDING *URINARY PROBLEMS (pain or burning when urinating, or frequent urination) *BOWEL PROBLEMS (unusual diarrhea, constipation, pain near the anus) TENDERNESS IN MOUTH AND THROAT WITH OR WITHOUT PRESENCE OF ULCERS (sore throat, sores in mouth, or a toothache) UNUSUAL RASH, SWELLING OR PAIN  UNUSUAL VAGINAL DISCHARGE OR ITCHING   Items with * indicate a potential emergency and should be followed up as soon as possible or go to the Emergency Department if any problems should occur.  Please show the CHEMOTHERAPY  ALERT CARD or IMMUNOTHERAPY ALERT CARD at check-in to the Emergency Department and triage nurse.  Should you have questions after your visit or need to cancel or reschedule your appointment, please contact [facility]  Dept: [phone]  and follow the prompts.  Office hours are 8:00 a.m. to 4:30 p.m. Monday - Friday. Please note that voicemails left after 4:00 p.m. may not be returned until the following business day.  We are closed weekends and major holidays. You have access to a nurse at all times for urgent questions. Please call the main number to the clinic Dept: [phone] and follow the prompts.   For any non-urgent questions, you may also contact your provider using MyChart. We now offer e-Visits for anyone 18 and older to request care online for non-urgent symptoms. For details visit [email or web address].   Also download the MyChart app! Go to the app store, search "MyChart", open the app, select [facility], and log in with your MyChart username and password.  

## 2022-12-07 NOTE — Progress Notes (Signed)
Patient Care Team: Pcp, No as PCP - General Pickenpack-Cousar, [name], NP as Nurse Practitioner (Nurse Practitioner) [name], MD as Consulting Physician (Hematology)   CHIEF COMPLAINT: Follow-up metastatic rectal cancer  Oncology History Overview Note  Cancer Staging Rectal cancer [facility]) Staging form: Colon and Rectum, AJCC 8th Edition - Clinical stage from 08/29/2021: [name], cM1 - Signed by [name], MD on 08/29/2021    Rectal cancer ([place])  07/23/2021 Imaging   CT AP  IMPRESSION: 1. Appearance of the rectum likely indicates a rectal mass suspicious for rectal carcinoma. Inflammatory process would be less likely. Direct visualization is suggested. 2. Cholelithiasis without evidence of acute cholecystitis. 3. Aortic atherosclerosis.   08/19/2021 Procedure   Colonoscopy, under Dr. [name]  Impression: - Rectal tenderness, palpable rectal mass and hemorrhoids found on digital rectal exam. - Stool in the entire examined colon - lavaged with still inadequate clearance. - One 35 mm polyp at the hepatic flexure. Biopsied. Tattooed distal in case future endoscopic resection is considered - however, has larger issues with rectal mass currently as below. - Four, 5 to 11 mm polyps in the transverse colon and at the hepatic flexure, removed with a cold snare. Resected and retrieved. - Diverticulosis in the recto-sigmoid colon and in the sigmoid colon. - Rule out malignancy, partially obstructing tumor in the rectum and from 6 to 13 cm proximal to the anus. Biopsied.   08/19/2021 Pathology Results   Diagnosis 1. Hepatic Flexure Biopsy - TUBULAR ADENOMA WITHOUT HIGH-GRADE DYSPLASIA OR MALIGNANCY 2. Transverse Colon Biopsy, and hepatic flexure, polyps (4) - TUBULAR ADENOMA WITHOUT HIGH-GRADE DYSPLASIA OR MALIGNANCY - OTHER FRAGMENTS OF POLYPOID COLONIC MUCOSA WITH NO SPECIFIC HISTOPATHOLOGIC CHANGES - FOOD MATERIAL 3. Rectum, biopsy - ADENOCARCINOMA. SEE NOTE    08/22/2021 Initial Diagnosis   Rectal cancer ([place])   08/26/2021 Imaging   IMPRESSION: 1. A 2.2 x 1.9 cm right middle lobe pulmonary nodule as well as a total of four right upper lobe pulmonary nodule and masses measuring up to 3.6 cm. Findings concerning for metastatic primary lung cancer versus less likely metastases in a patient with rectal cancer. Additional imaging evaluation or consultation with Pulmonology or Thoracic Surgery recommended. 2. No gross hilar adenopathy, noting limited sensitivity for the detection of hilar adenopathy on this noncontrast study. 3. Cholelithiasis. 4.  Emphysema (ICD10-J43.9). 5. At least left anterior descending coronary artery calcifications.   08/27/2021 Imaging   IMPRESSION: Rectal adenocarcinoma T stage: T4 B   Rectal adenocarcinoma N stage: N2 disease likely associated with early extra mesorectal lymph node involvement.   Distance from tumor to the internal anal sphincter is 1.2 cm.   Also with extramural venous involvement as described.   08/29/2021 Cancer Staging   Staging form: Colon and Rectum, AJCC 8th Edition - Clinical stage from 08/29/2021: [name], cM1 - Signed by [name], MD on 08/29/2021 Stage prefix: Initial diagnosis   01/23/2022 Imaging   CT CAP w contrast IMPRESSION: 1. Mild interval decrease in size of multiple right-sided pulmonary nodules. No new suspicious pulmonary nodule or mass. 2. Interval development of approximately 10 new ill-defined hypoattenuating liver lesions ranging in size from approximately 5-10 mm. Imaging features suspicious for metastatic disease. MRI abdomen with and without contrast may prove helpful to further evaluate. 3. Similar appearance of wall thickening in the rectum with perirectal edema. 4. Cholelithiasis. 5. Prostatomegaly. 6. Aortic Atherosclerosis (ICD10-I70.0).   11/21/2022 Imaging    IMPRESSION: 1. Stable exam. No new or progressive interval findings.  2. The primary  rectosigmoid lesion described previously is not well seen on the current study. There is presacral and perirectal edema, likely treatment related. 3. No substantial change in appearance of bilateral pulmonary metastases. 4. Stable tiny hypodensities in both hepatic lobes. These were previously characterized as metastatic disease. No new liver lesions on today's exam. 5. Cholelithiasis. 6. 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 Aldana returns for follow-up and treatment as scheduled, last seen by [name] 11/23/2022 and completed cycle 10 FOLFIRI/Beva.  I saw him in infusion room.  He is doing okay, tingling in his feet has improved, walking better.  He has had more abdominal pain and diarrhea in the past 4 months he attributes to not being as active or walking due to the colder weather.  He manages with 2 tabs prophylactic Lomotil morning and usually 1 (rarely 2) oxycodone per day.  He had 1 episode of vomiting with 5-FU infusing last cycle, otherwise no significant nausea.  He is able to eat and drink well, remain active, and care for his family.   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 ([place]) 08/19/2021     Past Surgical History:  Procedure Laterality Date   BRONCHIAL BIOPSY   09/27/2021   Procedure: BRONCHIAL BIOPSIES;  Surgeon: Garner Nash, DO;  Location: [place] [facility];  Service: Pulmonary;;   BRONCHIAL BRUSHINGS  09/27/2021   Procedure: BRONCHIAL BRUSHINGS;  Surgeon: Garner Nash, DO;  Location: [place] [facility];  Service: Pulmonary;;   BRONCHIAL NEEDLE ASPIRATION BIOPSY  09/27/2021   Procedure: BRONCHIAL NEEDLE ASPIRATION BIOPSIES;  Surgeon: Garner Nash, DO;  Location: [place] [facility];  Service: Pulmonary;;   COLONOSCOPY     IR IMAGING GUIDED PORT INSERTION  10/24/2021   left breast surgery Left 1968   VIDEO BRONCHOSCOPY WITH RADIAL ENDOBRONCHIAL ULTRASOUND  09/27/2021   Procedure: RADIAL ENDOBRONCHIAL ULTRASOUND;  Surgeon: Garner Nash, DO;  Location: [place] [facility];  Service: Pulmonary;;     Outpatient Encounter Medications as of 12/07/2022  Medication Sig Note   amLODipine (NORVASC) 10 MG tablet Take 1 tablet (10 mg total) by mouth daily.    carbonyl iron (FEOSOL) 45 MG TABS tablet Take 45 mg by mouth daily.    diphenoxylate-atropine (LOMOTIL) 2.5-0.025 MG tablet Take 1-2 tablets by mouth 4 (four) times daily as needed for diarrhea or loose stools.    DULoxetine (CYMBALTA) 20 MG capsule Take 1 capsule (20 mg total) by mouth daily.    gabapentin (NEURONTIN) 100 MG capsule Take 2 capsules (200 mg total) by mouth 3 (three) times daily.    Multiple Vitamin (MULTIVITAMIN WITH MINERALS) TABS tablet Take 1 tablet by mouth daily.    ondansetron (ZOFRAN) 8 MG tablet Take 1 tablet (8 mg total) by mouth every 8 (eight)  hours as needed for nausea or vomiting.    oxyCODONE (OXY IR/ROXICODONE) 5 MG immediate release tablet Take 1 tablet (5 mg total) by mouth every 8 (eight) hours as needed for severe pain or breakthrough pain.    [DISCONTINUED] ondansetron (ZOFRAN) 8 MG tablet Take 1 tablet (8 mg total) by mouth every 8 (eight) hours as needed for nausea or vomiting. 10/24/2021: Not started   [DISCONTINUED] prochlorperazine (COMPAZINE) 10 MG tablet Take 1 tablet  (10 mg total) by mouth every 6 (six) hours as needed (Nausea or vomiting). 50/27/7412: duplicate   Facility-Administered Encounter Medications as of 12/07/2022  Medication   [COMPLETED] atropine injection 0.5 mg   [COMPLETED] bevacizumab-adcd (VEGZELMA) 350 mg in sodium chloride 0.9 % 100 mL chemo infusion   fluorouracil (ADRUCIL) 4,450 mg in sodium chloride 0.9 % 61 mL chemo infusion   irinotecan (CAMPTOSAR) 300 mg in sodium chloride 0.9 % 500 mL chemo infusion   leucovorin 740 mg in sodium chloride 0.9 % 250 mL infusion     There were no vitals filed for this visit. There is no height or weight on file to calculate BMI.   PHYSICAL EXAM GENERAL:alert, no distress and comfortable SKIN: no rash  EYES: sclera clear LUNGS: clear with normal breathing effort HEART: regular rate & rhythm, no lower extremity edema ABDOMEN: abdomen soft, non-tender and normal bowel sounds NEURO: alert & oriented x 3 with fluent speech PAC without erythema    CBC    Component Value Date/Time   WBC 6.5 12/06/2022 1335   WBC 5.4 07/31/2022 1034   RBC 4.68 12/06/2022 1335   HGB 13.6 12/06/2022 1335   HCT 40.9 12/06/2022 1335   PLT 186 12/06/2022 1335   MCV 87.4 12/06/2022 1335   MCH 29.1 12/06/2022 1335   MCHC 33.3 12/06/2022 1335   RDW 16.9 (H) 12/06/2022 1335   LYMPHSABS 1.0 12/06/2022 1335   MONOABS 0.7 12/06/2022 1335   EOSABS 0.4 12/06/2022 1335   BASOSABS 0.1 12/06/2022 1335     CMP     Component Value Date/Time   NA 137 12/06/2022 1335   K 4.4 12/06/2022 1335   CL 104 12/06/2022 1335   CO2 30 12/06/2022 1335   GLUCOSE 84 12/06/2022 1335   BUN 10 12/06/2022 1335   CREATININE 0.79 12/06/2022 1335   CALCIUM 8.9 12/06/2022 1335   PROT 6.9 12/06/2022 1335   ALBUMIN 3.4 (L) 12/06/2022 1335   AST 22 12/06/2022 1335   ALT 16 12/06/2022 1335   ALKPHOS 161 (H) 12/06/2022 1335   BILITOT 0.3 12/06/2022 1335   GFRNONAA >60 12/06/2022 1335     ASSESSMENT & PLAN:Timoteo J Probert is a 72 y.o.  male with    1. Rectal Adenocarcinoma, [phone] with lung metastasis, MMR normal  -Presented with rectal bleeding and frequent bowel movement.  -staging pelvic MRI 08/27/21 showed: staging at T4b; N2 likely associated with early extra mesorectal lymph node involvement; distance from tumor to internal anal sphincter is 1.2 cm; also with extramural venous involvement. -baseline CEA 571.84 on 09/06/21. -he received concurrent chemoRT with Xeloda 11/1-11/21/22 under the care of Dr. [name].  He responded clinically, no recurrent rectal bleeding, his pain has improved  -CEA has decreased on treatment -Lung biopsy 09/27/21 confirmed metastatic rectal cancer.  -S/p first-line CapeOx and bevacizumab 10/14/2021 - 01/25/2022  -Due to new liver lesions he switch to second line FOLFIRI and continued bevacizumab starting 02/21/2022 -Staging CT CAP 11/21/2022 showed stable disease -Mr. Kallenbach appears stable. He continues FOLFIRI/beva q14  days, tolerating well overall. Neuropathy has improved. SE's well managed with supportive care at home. He is able to function well. No clinical evidence of disease progression.  -Labs reviewed, adequate to proceed with FOLFIRI/beva today -F/up and next cycle in 2 weeks    2.  CIPN -Improved with gabapentin, Cymbalta, and oxycodone -managed by palliative care Lexine Baton, NP   3. Abdominal pain and diarrhea -He has had more abdominal discomfort and diarrhea in the past 4 months, manages with Imodium and oxycodone which is effective -Patient attributes this to reducing exercise in the cold weather.  Plans to try to walk more -No TTP on exam -Recent CT shows no acute change or evidence of colitis -This is likely treatment related, he receives atropine.  He does not want to decrease the dose of irinotecan just yet -If diarrhea persists will check stool pathogens -Continue supportive care and monitoring   PLAN: -Labs reviewed -Proceed with cycle 11 FOLFIRI/Bev at today at same  dose -Continue supportive care -Cymbalta and Neurontin refilled -Follow-up and next cycle in 2 weeks     All questions were answered. The patient knows to call the clinic with any problems, questions or concerns. No barriers to learning were detected. I spent 20 minutes counseling the patient face to face. The total time spent in the appointment was 30 minutes and more than 50% was on counseling, review of test results, and coordination of care.   Cira Rue, NP-C 12/07/2022

## 2022-12-08 ENCOUNTER — Other Ambulatory Visit: Payer: Self-pay | Admitting: Nurse Practitioner

## 2022-12-08 ENCOUNTER — Telehealth: Payer: Self-pay | Admitting: Hematology

## 2022-12-08 DIAGNOSIS — G893 Neoplasm related pain (acute) (chronic): Secondary | ICD-10-CM

## 2022-12-08 DIAGNOSIS — C7800 Secondary malignant neoplasm of unspecified lung: Secondary | ICD-10-CM

## 2022-12-08 DIAGNOSIS — M792 Neuralgia and neuritis, unspecified: Secondary | ICD-10-CM

## 2022-12-08 DIAGNOSIS — Z515 Encounter for palliative care: Secondary | ICD-10-CM

## 2022-12-08 MED ORDER — ONDANSETRON HCL 8 MG PO TABS: 8.0000 mg | ORAL_TABLET | Freq: Three times a day (TID) | ORAL | 0 refills | Status: DC | PRN

## 2022-12-08 MED ORDER — OXYCODONE HCL 5 MG PO TABS: 5.0000 mg | ORAL_TABLET | Freq: Three times a day (TID) | ORAL | 0 refills | Status: DC | PRN

## 2022-12-08 NOTE — Telephone Encounter (Signed)
From: [name] To: Jobe Gibbon, NP Sent: 12/08/2022 7:47 AM EST Subject: Medication Renewal Request  Refills have been requested for the following medications:   ondansetron (ZOFRAN) 8 MG tablet Chesley Noon Pickenpack-Cousar]  [id number] [employer]

## 2022-12-08 NOTE — Telephone Encounter (Signed)
From: [name] To: Jobe Gibbon, NP Sent: 12/08/2022 7:47 AM EST Subject: Medication Renewal Request  Refills have been requested for the following medications:   oxyCODONE (OXY IR/ROXICODONE) 5 MG immediate release tablet Chesley Noon Pickenpack-Cousar]  Preferred pharmacy[id number] [employer]

## 2022-12-08 NOTE — Telephone Encounter (Signed)
Spoke with patient daughter confirming upcoming appointments

## 2022-12-09 ENCOUNTER — Other Ambulatory Visit: Payer: Self-pay | Admitting: Hematology

## 2022-12-09 ENCOUNTER — Inpatient Hospital Stay: Payer: Medicare Other

## 2022-12-09 VITALS — BP 149/87 | HR 82 | Temp 99.3°F | Resp 16

## 2022-12-09 DIAGNOSIS — Z5112 Encounter for antineoplastic immunotherapy: Secondary | ICD-10-CM | POA: Diagnosis not present

## 2022-12-09 DIAGNOSIS — C20 Malignant neoplasm of rectum: Secondary | ICD-10-CM

## 2022-12-09 MED ORDER — PEGFILGRASTIM-JMDB 6 MG/0.6ML ~~LOC~~ SOSY
6.0000 mg | PREFILLED_SYRINGE | Freq: Once | SUBCUTANEOUS | Status: AC
  Administered 2022-12-09: 6 mg via SUBCUTANEOUS

## 2022-12-09 MED ORDER — SODIUM CHLORIDE 0.9% FLUSH
10.0000 mL | INTRAVENOUS | Status: DC | PRN
  Administered 2022-12-09: 10 mL

## 2022-12-09 MED ORDER — HEPARIN SOD (PORK) LOCK FLUSH 100 UNIT/ML IV SOLN
500.0000 [IU] | Freq: Once | INTRAVENOUS | Status: AC | PRN
  Administered 2022-12-09: 500 [IU]

## 2022-12-11 MED ORDER — DIPHENOXYLATE-ATROPINE 2.5-0.025 MG PO TABS: 1.0000 | ORAL_TABLET | Freq: Four times a day (QID) | ORAL | 0 refills | Status: DC | PRN

## 2022-12-20 ENCOUNTER — Encounter (HOSPITAL_COMMUNITY): Payer: Self-pay | Admitting: Emergency Medicine

## 2022-12-20 ENCOUNTER — Emergency Department (HOSPITAL_COMMUNITY)
Admission: EM | Admit: 2022-12-20 | Discharge: 2022-12-20 | Disposition: A | Discharge status: Home / Self Care | Payer: Medicare Other | Attending: Emergency Medicine | Admitting: Emergency Medicine

## 2022-12-20 ENCOUNTER — Other Ambulatory Visit: Payer: Self-pay

## 2022-12-20 ENCOUNTER — Encounter: Payer: Self-pay | Admitting: Hematology

## 2022-12-20 DIAGNOSIS — C20 Malignant neoplasm of rectum: Secondary | ICD-10-CM | POA: Diagnosis not present

## 2022-12-20 DIAGNOSIS — K921 Melena: Secondary | ICD-10-CM | POA: Diagnosis not present

## 2022-12-20 LAB — CBC WITH DIFFERENTIAL/PLATELET
Abs Immature Granulocytes: 0.02 10*3/uL (ref 0.00–0.07)
Basophils Absolute: 0 10*3/uL (ref 0.0–0.1)
Basophils Relative: 1 %
Eosinophils Absolute: 0.3 10*3/uL (ref 0.0–0.5)
Eosinophils Relative: 5 %
HCT: 47.9 % (ref 39.0–52.0)
Hemoglobin: 14.9 g/dL (ref 13.0–17.0)
Immature Granulocytes: 0 %
Lymphocytes Relative: 21 %
Lymphs Abs: 1.3 10*3/uL (ref 0.7–4.0)
MCH: 28.2 pg (ref 26.0–34.0)
MCHC: 31.1 g/dL (ref 30.0–36.0)
MCV: 90.5 fL (ref 80.0–100.0)
Monocytes Absolute: 0.8 10*3/uL (ref 0.1–1.0)
Monocytes Relative: 13 %
Neutro Abs: 3.6 10*3/uL (ref 1.7–7.7)
Neutrophils Relative %: 60 %
Platelets: 225 10*3/uL (ref 150–400)
RBC: 5.29 MIL/uL (ref 4.22–5.81)
RDW: 18 % — ABNORMAL HIGH (ref 11.5–15.5)
WBC: 6 10*3/uL (ref 4.0–10.5)
nRBC: 0 % (ref 0.0–0.2)

## 2022-12-20 LAB — COMPREHENSIVE METABOLIC PANEL
ALT: 16 U/L (ref 0–44)
AST: 25 U/L (ref 15–41)
Albumin: 3 g/dL — ABNORMAL LOW (ref 3.5–5.0)
Alkaline Phosphatase: 126 U/L (ref 38–126)
Anion gap: 7 (ref 5–15)
BUN: 8 mg/dL (ref 8–23)
CO2: 27 mmol/L (ref 22–32)
Calcium: 8.7 mg/dL — ABNORMAL LOW (ref 8.9–10.3)
Chloride: 103 mmol/L (ref 98–111)
Creatinine, Ser: 0.83 mg/dL (ref 0.61–1.24)
GFR, Estimated: >60 mL/min (ref >60)
Glucose, Bld: 109 mg/dL — ABNORMAL HIGH (ref 70–99)
Potassium: 3.8 mmol/L (ref 3.5–5.1)
Sodium: 137 mmol/L (ref 135–145)
Total Bilirubin: 0.8 mg/dL (ref 0.3–1.2)
Total Protein: 6.7 g/dL (ref 6.5–8.1)

## 2022-12-20 MED FILL — Dexamethasone Sodium Phosphate Inj 100 MG/10ML: INTRAMUSCULAR | Qty: 1 | Status: AC

## 2022-12-20 NOTE — ED Provider Notes (Signed)
[facility] Provider Note   CSN: [id number] Arrival date & time: 12/20/22  [id number]     History  Chief Complaint  Patient presents with   Melena    Jorge [name] is a 72 y.o. male.  HPI   72 year old male with past medical history of rectal cancer currently undergoing chemo presents emergency department with concern for diarrhea and dark stools.  Patient states for the past couple weeks is starting a new chemotherapy has been having loose stools.  For the past 3 days he has noticed that it has been black.  He is not on any blood thinning medicine.  Denies any fever or abdominal pain.  Admits to taking Pepto-Bismol.  Denies any weakness.  Home Medications Prior to Admission medications   Medication Sig Start Date End Date Taking? Authorizing Provider  amLODipine (NORVASC) 10 MG tablet Take 1 tablet (10 mg total) by mouth daily. 11/23/22   [name], MD  carbonyl iron (FEOSOL) 45 MG TABS tablet Take 45 mg by mouth daily.    [provider]  diphenoxylate-atropine (LOMOTIL) 2.5-0.025 MG tablet Take 1-2 tablets by mouth 4 (four) times daily as needed for diarrhea or loose stools. 12/11/22   [name], MD  DULoxetine (CYMBALTA) 20 MG capsule Take 1 capsule (20 mg total) by mouth daily. 12/07/22   Alla Feeling, NP  gabapentin (NEURONTIN) 100 MG capsule Take 2 capsules (200 mg total) by mouth 3 (three) times daily. 12/07/22   Alla Feeling, NP  Multiple Vitamin (MULTIVITAMIN WITH MINERALS) TABS tablet Take 1 tablet by mouth daily.    [provider]  ondansetron (ZOFRAN) 8 MG tablet Take 1 tablet (8 mg total) by mouth every 8 (eight) hours as needed for nausea or vomiting. 12/08/22   Pickenpack-Cousar, [name], NP  oxyCODONE (OXY IR/ROXICODONE) 5 MG immediate release tablet Take 1 tablet (5 mg total) by mouth every 8 (eight) hours as needed for severe pain or breakthrough pain. 12/09/22   Pickenpack-Cousar, [name], NP  ondansetron  (ZOFRAN) 8 MG tablet Take 1 tablet (8 mg total) by mouth every 8 (eight) hours as needed for nausea or vomiting. 09/09/21   [name], MD  prochlorperazine (COMPAZINE) 10 MG tablet Take 1 tablet (10 mg total) by mouth every 6 (six) hours as needed (Nausea or vomiting). 10/14/21 02/15/22  [name], MD      Allergies    Patient has no known allergies.    Review of Systems   Review of Systems  Constitutional:  Negative for fatigue and fever.  Respiratory:  Negative for shortness of breath.   Cardiovascular:  Negative for chest pain.  Gastrointestinal:  Negative for abdominal pain, diarrhea and vomiting.       Black stools  Neurological:  Negative for headaches.    Physical Exam Updated Vital Signs BP (!) 172/87 (BP Location: Left Arm)   Pulse 82   Temp (!) 97.5 F (36.4 C) (Oral)   Resp 18   Ht 6' (1.829 m)   Wt 67.6 kg   SpO2 98%   BMI 20.21 kg/m  Physical Exam Vitals and nursing note reviewed.  Constitutional:      General: He is not in acute distress.    Appearance: Normal appearance.  HENT:     Head: Normocephalic.     Mouth/Throat:     Mouth: Mucous membranes are moist.  Cardiovascular:     Rate and Rhythm: Normal rate.  Pulmonary:  Effort: Pulmonary effort is normal. No respiratory distress.  Abdominal:     General: There is no distension.     Palpations: Abdomen is soft.     Tenderness: There is no abdominal tenderness. There is no guarding or rebound.  Skin:    General: Skin is warm.  Neurological:     Mental Status: He is alert and oriented to person, place, and time. Mental status is at baseline.  Psychiatric:        Mood and Affect: Mood normal.     ED Results / Procedures / Treatments   Labs (all labs ordered are listed, but only abnormal results are displayed) Labs Reviewed  CBC WITH DIFFERENTIAL/PLATELET - Abnormal; Notable for the following components:      Result Value   RDW 18.0 (*)    All other components within normal limits   COMPREHENSIVE METABOLIC PANEL    EKG None  Radiology No results found.  Procedures Procedures    Medications Ordered in ED Medications - No data to display  ED Course/ Medical Decision Making/ A&P                             Medical Decision Making Amount and/or Complexity of Data Reviewed Labs: ordered.   73 year old male presents emergency department with couple days of black stools.  Denies any abdominal pain, no fever.  Currently on chemotherapy. No AC.  Vitals are stable here, abdomen is completely benign.  Blood work is reassuring, hemoglobin is greater than 14.  Chemistry is unremarkable.  Patient reveals that he has been using Pepto-Bismol multiple times a day for the past couple days.  This would most likely be the cause of his black stools.    Patient at this time appears safe and stable for discharge and close outpatient follow up. Discharge plan and strict return to ED precautions discussed, patient verbalizes understanding and agreement.        Final Clinical Impression(s) / ED Diagnoses Final diagnoses:  None    Rx / DC Orders ED Discharge Orders     None         Lorelle Gibbs, DO 12/20/22 1152

## 2022-12-20 NOTE — Discharge Instructions (Addendum)
You have been seen and discharged from the emergency department.  Your blood work was normal, specifically your hemoglobin.  I believe the black stools may be related to your Pepto-Bismol use.  Follow-up with your primary provider for further evaluation and further care. Take home medications as prescribed. If you have any worsening symptoms or further concerns for your health please return to an emergency department for further evaluation.

## 2022-12-20 NOTE — ED Triage Notes (Signed)
Patient arrives ambulatory by POV with daughter c/o dark stools. Patient states he is receiving chemo here at [facility] cancer center. Patient reports emesis and diarrhea since last chemo treatment about 2 weeks ago. Dark stools started about 3 days ago. Denies any blood thinners.

## 2022-12-21 ENCOUNTER — Inpatient Hospital Stay: Payer: Medicare Other

## 2022-12-21 ENCOUNTER — Inpatient Hospital Stay (HOSPITAL_BASED_OUTPATIENT_CLINIC_OR_DEPARTMENT_OTHER): Payer: Medicare Other | Admitting: Nurse Practitioner

## 2022-12-21 ENCOUNTER — Encounter: Payer: Self-pay | Admitting: Hematology

## 2022-12-21 ENCOUNTER — Other Ambulatory Visit: Payer: Self-pay

## 2022-12-21 ENCOUNTER — Inpatient Hospital Stay (HOSPITAL_BASED_OUTPATIENT_CLINIC_OR_DEPARTMENT_OTHER): Payer: Medicare Other | Admitting: Hematology

## 2022-12-21 ENCOUNTER — Other Ambulatory Visit: Payer: Self-pay | Admitting: Hematology

## 2022-12-21 ENCOUNTER — Other Ambulatory Visit: Payer: Self-pay | Admitting: Nurse Practitioner

## 2022-12-21 ENCOUNTER — Encounter: Payer: Self-pay | Admitting: Nurse Practitioner

## 2022-12-21 VITALS — BP 156/89 | HR 82 | Temp 98.3°F | Wt 149.0 lb

## 2022-12-21 DIAGNOSIS — Z5112 Encounter for antineoplastic immunotherapy: Secondary | ICD-10-CM | POA: Diagnosis not present

## 2022-12-21 DIAGNOSIS — C7800 Secondary malignant neoplasm of unspecified lung: Secondary | ICD-10-CM | POA: Diagnosis not present

## 2022-12-21 DIAGNOSIS — Z515 Encounter for palliative care: Secondary | ICD-10-CM

## 2022-12-21 DIAGNOSIS — R197 Diarrhea, unspecified: Secondary | ICD-10-CM

## 2022-12-21 DIAGNOSIS — G620 Drug-induced polyneuropathy: Secondary | ICD-10-CM | POA: Diagnosis not present

## 2022-12-21 DIAGNOSIS — C20 Malignant neoplasm of rectum: Secondary | ICD-10-CM

## 2022-12-21 DIAGNOSIS — M792 Neuralgia and neuritis, unspecified: Secondary | ICD-10-CM

## 2022-12-21 DIAGNOSIS — Z95828 Presence of other vascular implants and grafts: Secondary | ICD-10-CM

## 2022-12-21 DIAGNOSIS — T451X5A Adverse effect of antineoplastic and immunosuppressive drugs, initial encounter: Secondary | ICD-10-CM

## 2022-12-21 DIAGNOSIS — G893 Neoplasm related pain (acute) (chronic): Secondary | ICD-10-CM

## 2022-12-21 DIAGNOSIS — D500 Iron deficiency anemia secondary to blood loss (chronic): Secondary | ICD-10-CM

## 2022-12-21 LAB — TOTAL PROTEIN, URINE DIPSTICK: Protein, ur: 30 mg/dL — AB

## 2022-12-21 LAB — FERRITIN: Ferritin: 195 ng/mL (ref 24–336)

## 2022-12-21 MED ORDER — SODIUM CHLORIDE 0.9 % IV SOLN
10.0000 mg | Freq: Once | INTRAVENOUS | Status: AC
  Administered 2022-12-21: 10 mg via INTRAVENOUS
  Filled 2022-12-21: qty 10

## 2022-12-21 MED ORDER — OXYCODONE HCL 5 MG PO TABS: 5.0000 mg | ORAL_TABLET | Freq: Three times a day (TID) | ORAL | 0 refills | Status: DC | PRN

## 2022-12-21 MED ORDER — SODIUM CHLORIDE 0.9 % IV SOLN
5.0000 mg/kg | Freq: Once | INTRAVENOUS | Status: AC
  Administered 2022-12-21: 350 mg via INTRAVENOUS
  Filled 2022-12-21: qty 14

## 2022-12-21 MED ORDER — SODIUM CHLORIDE 0.9 % IV SOLN
400.0000 mg/m2 | Freq: Once | INTRAVENOUS | Status: AC
  Administered 2022-12-21: 740 mg via INTRAVENOUS
  Filled 2022-12-21: qty 37

## 2022-12-21 MED ORDER — PALONOSETRON HCL INJECTION 0.25 MG/5ML
0.2500 mg | Freq: Once | INTRAVENOUS | Status: AC
  Administered 2022-12-21: 0.25 mg via INTRAVENOUS
  Filled 2022-12-21: qty 5

## 2022-12-21 MED ORDER — SODIUM CHLORIDE 0.9% FLUSH
10.0000 mL | Freq: Once | INTRAVENOUS | Status: AC
  Administered 2022-12-21: 10 mL

## 2022-12-21 MED ORDER — SODIUM CHLORIDE 0.9 % IV SOLN
160.0000 mg/m2 | Freq: Once | INTRAVENOUS | Status: AC
  Administered 2022-12-21: 300 mg via INTRAVENOUS
  Filled 2022-12-21: qty 4.14

## 2022-12-21 MED ORDER — DIPHENOXYLATE-ATROPINE 2.5-0.025 MG PO TABS: 1.0000 | ORAL_TABLET | Freq: Four times a day (QID) | ORAL | 0 refills | Status: DC | PRN

## 2022-12-21 MED ORDER — ATROPINE SULFATE 1 MG/ML IV SOLN
0.5000 mg | Freq: Once | INTRAVENOUS | Status: AC | PRN
  Administered 2022-12-21: 0.5 mg via INTRAVENOUS
  Filled 2022-12-21: qty 1

## 2022-12-21 MED ORDER — SODIUM CHLORIDE 0.9 % IV SOLN
2400.0000 mg/m2 | INTRAVENOUS | Status: DC
  Administered 2022-12-21: 4450 mg via INTRAVENOUS
  Filled 2022-12-21: qty 89

## 2022-12-21 MED ORDER — SODIUM CHLORIDE 0.9 % IV SOLN: Freq: Once | INTRAVENOUS | Status: AC

## 2022-12-21 NOTE — Progress Notes (Signed)
**Note Jorge-Identified via Obfuscation** Jorge [name]  Telephone:([phone]) [phone] Fax:(336) [phone]   Name: Jorge [name] Date: 12/21/2022 MRN: [id number]  DOB: 05/19/1951  Patient Care Team: Pcp, No as PCP - General Jorge [name], Jorge [name] as Nurse Practitioner (Nurse Practitioner) Jorge [name], Jorge [name] (Hematology)   INTERVAL HISTORY: Jorge [name] is a 72 y.o. 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  Palliative ask to see for symptom management and goals of care.   SOCIAL HISTORY:     reports that he has been smoking cigarettes. He has a 5.00 pack-year smoking history. He has never used smokeless tobacco. He reports that he does not currently use alcohol. He reports that he does not use drugs.  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  MEDICATIONS:  Current Outpatient Medications  Medication Sig Dispense Refill   amLODipine (NORVASC) 10 MG tablet Take 1 tablet (10 mg total) by mouth daily. 30 tablet 1   carbonyl iron (FEOSOL) 45 MG TABS tablet Take 45 mg by mouth daily.     diphenoxylate-atropine (LOMOTIL) 2.5-0.025 MG tablet Take 1-2 tablets by mouth 4 (four) times daily as needed for diarrhea or loose stools. 60 tablet 0   DULoxetine (CYMBALTA) 20 MG capsule Take 1 capsule (20 mg total) by mouth daily. 30 capsule 1   gabapentin (NEURONTIN) 100 MG capsule Take 2 capsules (200 mg total) by mouth 3 (three) times daily. 180 capsule 1   Multiple Vitamin (MULTIVITAMIN WITH MINERALS) TABS tablet Take 1 tablet by mouth daily.     ondansetron (ZOFRAN) 8 MG tablet Take 1 tablet (8 mg total) by mouth every 8 (eight) hours as needed for nausea or vomiting. 30 tablet 0   oxyCODONE (OXY IR/ROXICODONE) 5 MG  immediate release tablet Take 1 tablet (5 mg total) by mouth every 8 (eight) hours as needed for severe pain or breakthrough pain. 45 tablet 0   No current facility-administered medications for this visit.    VITAL SIGNS: There were no vitals taken for this visit. There were no vitals filed for this visit.   Estimated body mass index is 20.21 kg/m as calculated from the following:   Height as of 12/20/22: 6' (1.829 m).   Weight as of 12/20/22: 149 lb (67.6 kg).   PERFORMANCE STATUS (ECOG) : 1 - Symptomatic but completely ambulatory  Assessment NAD, ambulatory RRR Normal breathing pattern  AAO x3, mood appropriate    IMPRESSION: I saw Jorge [name] during his infusion. Shares he has been dealing with some diarrhea over the past week. He was seen in ED and released back home. Patient apparently had been taking Pepto Bismol which caused dark stools. Also has Lomotil on hand at home but is request refill. Appetite has decreased over past several days. Denies nausea or vomiting. Is trying to remain as active as possible. Weight is stable at 149lbs.   Neuropathic Pain  Jorge [name]'s pain continues to be well controlled on current regimen. Some days are better than others pending level of activity and around treatment days. Much improvement in his neuropathic symptoms allowing him to walk further distance and longer. He is tolerating cymbalta and gabapentin. Oxy IR as needed for severe pain/discomfort.   No changes to  current regimen.   Will continue to closely monitor and support as needed.   PLAN:  Gabapentin 321m three times daily.   Cymbalta 267mdaily. Tolerating well.  Oxy IR 79m179mvery 8 hours as needed. Not requiring around the clock.  I will plan to see patient back in 3-4 weeks in collaboration with his oncology appointments.   Patient expressed understanding and was in agreement with this plan. He also understands that He can call the clinic at any time with any questions,  concerns, or complaints.     Any controlled substances utilized were prescribed in the context of palliative care. PDMP has been reviewed.    Time Total: 20 min   Visit consisted of counseling and education dealing with the complex and emotionally intense issues of symptom management and palliative care in the setting of serious and potentially life-threatening illness.Greater than 50%  of this time was spent counseling and coordinating care related to the above assessment and plan.  Jorge [name]  Palliative Medicine Team/Jorge [name]

## 2022-12-21 NOTE — Assessment & Plan Note (Signed)
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 He is on gabapentin and cymbalta, taking 2-3 oxycodone a day. He is followed by NP Lexine Baton.

## 2022-12-21 NOTE — Patient Instructions (Signed)
[facility]  Discharge Instructions: Thank you for choosing [facility] to provide your oncology and hematology care.   If you have a lab appointment with the [facility], please go directly to the [facility] and check in at the registration area.   Wear comfortable clothing and clothing appropriate for easy access to any Portacath or PICC line.   We strive to give you quality time with your provider. You may need to reschedule your appointment if you arrive late (15 or more minutes).  Arriving late affects you and other patients whose appointments are after yours.  Also, if you miss three or more appointments without notifying the office, you may be dismissed from the clinic at the provider's discretion.      For prescription refill requests, have your pharmacy contact our office and allow 72 hours for refills to be completed.    Today you received the following chemotherapy and/or immunotherapy agents; Bevacizumab-adcd, Fluorouacil, Irinotecan, & Leucovorin      To help prevent nausea and vomiting after your treatment, we encourage you to take your nausea medication as directed.  BELOW ARE SYMPTOMS THAT SHOULD BE REPORTED IMMEDIATELY: *FEVER GREATER THAN 100.4 F (38 C) OR HIGHER *CHILLS OR SWEATING *NAUSEA AND VOMITING THAT IS NOT CONTROLLED WITH YOUR NAUSEA MEDICATION *UNUSUAL SHORTNESS OF BREATH *UNUSUAL BRUISING OR BLEEDING *URINARY PROBLEMS (pain or burning when urinating, or frequent urination) *BOWEL PROBLEMS (unusual diarrhea, constipation, pain near the anus) TENDERNESS IN MOUTH AND THROAT WITH OR WITHOUT PRESENCE OF ULCERS (sore throat, sores in mouth, or a toothache) UNUSUAL RASH, SWELLING OR PAIN  UNUSUAL VAGINAL DISCHARGE OR ITCHING   Items with * indicate a potential emergency and should be followed up as soon as possible or go to the Emergency Department if any problems should occur.  Please show the CHEMOTHERAPY  ALERT CARD or IMMUNOTHERAPY ALERT CARD at check-in to the Emergency Department and triage nurse.  Should you have questions after your visit or need to cancel or reschedule your appointment, please contact [facility]  Dept: [phone]  and follow the prompts.  Office hours are 8:00 a.m. to 4:30 p.m. Monday - Friday. Please note that voicemails left after 4:00 p.m. may not be returned until the following business day.  We are closed weekends and major holidays. You have access to a nurse at all times for urgent questions. Please call the main number to the clinic Dept: [phone] and follow the prompts.   For any non-urgent questions, you may also contact your provider using MyChart. We now offer e-Visits for anyone 37 and older to request care online for non-urgent symptoms. For details visit [email or web address].   Also download the MyChart app! Go to the app store, search "MyChart", open the app, select [facility], and log in with your MyChart username and password.

## 2022-12-21 NOTE — Assessment & Plan Note (Signed)
-  [id number] with lung metastasis, MMR normal, KRAS(+) [id number]  -diagnosed 08/2021 by colonoscopy for rectal bleeding and frequent BM. Staging MRI showed early extra mesorectal lymph node involvement.  -baseline CEA 571.84 on 09/06/21.  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 Xeloda dose reduced due to elevated liver enzymes.  -due to new liver lesions, we switched to second-line FOLFIRI, with continued beva, on 02/21/22. He tolerates well overall.  -I reviewed his recent restaging CT scan images, which showed stable lung and liver metastasis, no new lesions.  I discussed with patient -Will continue current chemotherapy.  We also discussed maintenance irinotecan along, since he is tolerating FOLFIRI well, will continue for now.

## 2022-12-21 NOTE — Telephone Encounter (Signed)
From: [name] To: Jobe Gibbon, NP Sent: 12/21/2022 8:32 AM EST Subject: Medication Renewal Request  Refills have been requested for the following medications:   oxyCODONE (OXY IR/ROXICODONE) 5 MG immediate release tablet [name]]  [name] [address]: [employer]

## 2022-12-21 NOTE — Progress Notes (Signed)
[facility]   [phone] [phone]   Clinic Follow up Note   Patient Care Team: Pcp, No as PCP - General Pickenpack-Cousar, [name], NP as Nurse Practitioner (Nurse Practitioner) [name], MD as Consulting Physician (Hematology)  Date of Service:  12/21/2022  CHIEF COMPLAINT: f/u of metastatic rectal cancer   CURRENT THERAPY:  Second-line FOLFIRI, q2weeks, started 02/22/22 -Bevacizumab started 11/24/21  ASSESSMENT:  BIENVENIDO NOVOSEL is a 72 y.o. male with   Rectal cancer ([place]) [id number] with lung metastasis, MMR normal, KRAS(+) [id number]  -diagnosed 08/2021 by colonoscopy for rectal bleeding and frequent BM. Staging MRI showed early extra mesorectal lymph node involvement.  -baseline CEA 571.84 on 09/06/21.  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 Xeloda dose reduced due to elevated liver enzymes.  -due to new liver lesions, we switched to second-line FOLFIRI, with continued beva, on 02/21/22. He tolerates well overall.  - his recent restaging CT scan from 11/21/2022 showed stable lung and liver metastasis, no new lesions.   -Will continue current chemotherapy.  We also discussed maintenance irinotecan along, since he is tolerating FOLFIRI well, will continue for now.  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 He is on gabapentin and cymbalta, taking 2-3 oxycodone a day. He is followed by NP Lexine Baton.      PLAN: -I refill Iiomotil -proceed with C12 Folfiri+beva today -lab/flush and FOLFIRI+BEVA 12/08/2022  SUMMARY OF ONCOLOGIC HISTORY: Oncology History Overview Note  Cancer Staging Rectal cancer [facility]) Staging form: Colon and Rectum, AJCC 8th Edition - Clinical stage from 08/29/2021: [name], cM1 - Signed by [name], MD on 08/29/2021    Rectal cancer  ([place])  07/23/2021 Imaging   CT AP  IMPRESSION: 1. Appearance of the rectum likely indicates a rectal mass suspicious for rectal carcinoma. Inflammatory process would be less likely. Direct visualization is suggested. 2. Cholelithiasis without evidence of acute cholecystitis. 3. Aortic atherosclerosis.   08/19/2021 Procedure   Colonoscopy, under Dr. [name]  Impression: - Rectal tenderness, palpable rectal mass and hemorrhoids found on digital rectal exam. - Stool in the entire examined colon - lavaged with still inadequate clearance. - One 35 mm polyp at the hepatic flexure. Biopsied. Tattooed distal in case future endoscopic resection is considered - however, has larger issues with rectal mass currently as below. - Four, 5 to 11 mm polyps in the transverse colon and at the hepatic flexure, removed with a cold snare. Resected and retrieved. - Diverticulosis in the recto-sigmoid colon and in the sigmoid colon. - Rule out malignancy, partially obstructing tumor in the rectum and from 6 to 13 cm proximal to the anus. Biopsied.   08/19/2021 Pathology Results   Diagnosis 1. Hepatic Flexure Biopsy - TUBULAR ADENOMA WITHOUT HIGH-GRADE DYSPLASIA OR MALIGNANCY 2. Transverse Colon Biopsy, and hepatic flexure, polyps (4) - TUBULAR ADENOMA WITHOUT HIGH-GRADE DYSPLASIA OR MALIGNANCY - OTHER FRAGMENTS OF POLYPOID COLONIC MUCOSA WITH NO SPECIFIC HISTOPATHOLOGIC CHANGES - FOOD MATERIAL 3. Rectum, biopsy - ADENOCARCINOMA. SEE NOTE   08/22/2021 Initial Diagnosis   Rectal cancer ([place])   08/26/2021 Imaging   IMPRESSION: 1. A 2.2 x 1.9 cm right middle lobe pulmonary nodule as well as a total of four right upper lobe pulmonary nodule and masses measuring up to 3.6 cm. Findings concerning for metastatic primary lung cancer versus less likely metastases in a patient with rectal cancer. Additional  imaging evaluation or consultation with Pulmonology or Thoracic Surgery recommended. 2. No gross hilar  adenopathy, noting limited sensitivity for the detection of hilar adenopathy on this noncontrast study. 3. Cholelithiasis. 4.  Emphysema (ICD10-J43.9). 5. At least left anterior descending coronary artery calcifications.   08/27/2021 Imaging   IMPRESSION: Rectal adenocarcinoma T stage: T4 B   Rectal adenocarcinoma N stage: N2 disease likely associated with early extra mesorectal lymph node involvement.   Distance from tumor to the internal anal sphincter is 1.2 cm.   Also with extramural venous involvement as described.   08/29/2021 Cancer Staging   Staging form: Colon and Rectum, AJCC 8th Edition - Clinical stage from 08/29/2021: [name], cM1 - Signed by [name], MD on 08/29/2021 Stage prefix: Initial diagnosis   01/23/2022 Imaging   CT CAP w contrast IMPRESSION: 1. Mild interval decrease in size of multiple right-sided pulmonary nodules. No new suspicious pulmonary nodule or mass. 2. Interval development of approximately 10 new ill-defined hypoattenuating liver lesions ranging in size from approximately 5-10 mm. Imaging features suspicious for metastatic disease. MRI abdomen with and without contrast may prove helpful to further evaluate. 3. Similar appearance of wall thickening in the rectum with perirectal edema. 4. Cholelithiasis. 5. Prostatomegaly. 6. Aortic Atherosclerosis (ICD10-I70.0).   11/21/2022 Imaging    IMPRESSION: 1. Stable exam. No new or progressive interval findings. 2. The primary rectosigmoid lesion described previously is not well seen on the current study. There is presacral and perirectal edema, likely treatment related. 3. No substantial change in appearance of bilateral pulmonary metastases. 4. Stable tiny hypodensities in both hepatic lobes. These were previously characterized as metastatic disease. No new liver lesions on today's exam. 5. Cholelithiasis. 6. 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 Pt was In the ED f on 12/20/2022 for black stools. Pt went and got Lomotil prescription filled. Pt stated he has some diarrhea and was taking Pepto -Bismol .Pt reports that he drinks 2-3 Ensure a day.     All other systems were reviewed with the patient and are negative.  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 ([place]) 08/19/2021    SURGICAL HISTORY: Past Surgical History:  Procedure Laterality Date   BRONCHIAL BIOPSY  09/27/2021   Procedure: BRONCHIAL BIOPSIES;  Surgeon: Garner Nash, DO;  Location: [facility];  Service: Pulmonary;;   BRONCHIAL BRUSHINGS  09/27/2021   Procedure: BRONCHIAL BRUSHINGS;  Surgeon: Garner Nash, DO;  Location: [facility];  Service: Pulmonary;;   BRONCHIAL NEEDLE ASPIRATION BIOPSY  09/27/2021   Procedure: BRONCHIAL NEEDLE ASPIRATION BIOPSIES;  Surgeon: Garner Nash, DO;  Location: [facility];  Service: Pulmonary;;   COLONOSCOPY     IR IMAGING GUIDED PORT INSERTION  10/24/2021   left breast surgery Left 1968   VIDEO BRONCHOSCOPY WITH RADIAL ENDOBRONCHIAL ULTRASOUND  09/27/2021   Procedure: RADIAL ENDOBRONCHIAL ULTRASOUND;  Surgeon: Garner Nash, DO;  Location: [facility];  Service: Pulmonary;;    I have reviewed the social history and family  history with the patient and they are unchanged from previous note.  ALLERGIES:  has No Known Allergies.  MEDICATIONS:  Current Outpatient Medications  Medication Sig Dispense Refill   amLODipine (NORVASC) 10 MG tablet Take 1 tablet (10 mg total) by mouth daily. 30 tablet 1   carbonyl iron (FEOSOL) 45 MG TABS tablet Take 45 mg by mouth daily.     diphenoxylate-atropine (LOMOTIL) 2.5-0.025 MG tablet Take 1-2 tablets by mouth 4 (four) times daily as needed for diarrhea or loose stools. 60 tablet 0   DULoxetine (CYMBALTA) 20 MG capsule Take 1 capsule (20 mg total) by mouth daily. 30 capsule 1   gabapentin (NEURONTIN) 100 MG capsule Take 2 capsules (200 mg total) by mouth 3 (three) times daily. 180 capsule 1   Multiple Vitamin (MULTIVITAMIN WITH MINERALS) TABS tablet Take 1 tablet by mouth daily.     ondansetron (ZOFRAN) 8 MG tablet Take 1 tablet (8 mg total) by mouth every 8 (eight) hours as needed for nausea or vomiting. 30 tablet 0   [START ON 12/23/2022] oxyCODONE (OXY IR/ROXICODONE) 5 MG immediate release tablet Take 1 tablet (5 mg total) by mouth every 8 (eight) hours as needed for severe pain or breakthrough pain. 45 tablet 0   No current facility-administered medications for this visit.    PHYSICAL EXAMINATION: ECOG PERFORMANCE STATUS: 1 - Symptomatic but completely ambulatory  There were no vitals filed for this visit. Wt Readings from Last 3 Encounters:  12/21/22 149 lb (67.6 kg)  12/20/22 149 lb (67.6 kg)  12/07/22 149 lb 4 oz (67.7 kg)     GENERAL:alert, no distress and comfortable SKIN: skin color normal, no rashes or significant lesions EYES: normal, Conjunctiva are pink and non-injected, sclera clear  NEURO: alert & oriented x 3 with fluent speech  LABORATORY DATA:  I have reviewed the data as listed    Latest Ref Rng & Units 12/20/2022    9:00 AM 12/06/2022    1:35 PM 11/23/2022    9:07 AM  CBC  WBC 4.0 - 10.5 K/uL 6.0  6.5  5.5   Hemoglobin 13.0 - 17.0 g/dL 14.9   13.6  13.2   Hematocrit 39.0 - 52.0 % 47.9  40.9  41.2   Platelets 150 - 400 K/uL 225  186  185         Latest Ref Rng & Units 12/20/2022   10:30 AM 12/06/2022    1:35 PM 11/23/2022    9:07 AM  CMP  Glucose 70 - 99 mg/dL 109  84  90   BUN 8 - 23 mg/dL 8  10  10   $ Creatinine 0.61 - 1.24 mg/dL 0.83  0.79  0.85   Sodium 135 - 145 mmol/L 137  137  140   Potassium 3.5 - 5.1 mmol/L 3.8  4.4  4.0   Chloride 98 - 111 mmol/L 103  104  105   CO2 22 - 32 mmol/L 27  30  31   $ Calcium 8.9 - 10.3 mg/dL 8.7  8.9  9.0   Total Protein 6.5 - 8.1 g/dL 6.7  6.9  6.3   Total Bilirubin 0.3 - 1.2 mg/dL 0.8  0.3  0.4   Alkaline  Phos 38 - 126 U/L 126  161  136   AST 15 - 41 U/L 25  22  18   $ ALT 0 - 44 U/L 16  16  13       $ RADIOGRAPHIC STUDIES: I have personally reviewed the radiological images as listed and agreed with the findings in the report. No results found.    Orders Placed This Encounter  Procedures   CBC with Differential ([facility] Only)    Standing Status:   Future    Standing Expiration Date:   01/05/2024   CMP ([facility] only)    Standing Status:   Future    Standing Expiration Date:   01/05/2024   Total Protein, Urine dipstick    Standing Status:   Future    Standing Expiration Date:   01/05/2024   CBC with Differential ([facility] Only)    Standing Status:   Future    Standing Expiration Date:   01/19/2024   CMP ([facility] only)    Standing Status:   Future    Standing Expiration Date:   01/19/2024   Total Protein, Urine dipstick    Standing Status:   Future    Standing Expiration Date:   01/19/2024   CBC with Differential ([facility] Only)    Standing Status:   Future    Standing Expiration Date:   02/02/2024   CMP ([facility] only)    Standing Status:   Future    Standing Expiration Date:   02/02/2024   Total Protein, Urine dipstick    Standing Status:   Future    Standing Expiration Date:   02/02/2024   All questions were answered. The patient knows to  call the clinic with any problems, questions or concerns. No barriers to learning was detected. The total time spent in the appointment was 30 minutes.     [name], MD 12/21/2022   Felicity Coyer, CMA, am acting as scribe for [name], MD.   I have reviewed the above documentation for accuracy and completeness, and I agree with the above.

## 2022-12-23 ENCOUNTER — Inpatient Hospital Stay: Payer: Medicare Other

## 2022-12-23 VITALS — BP 155/93 | HR 73 | Temp 98.1°F | Resp 16

## 2022-12-23 DIAGNOSIS — C7800 Secondary malignant neoplasm of unspecified lung: Secondary | ICD-10-CM

## 2022-12-23 DIAGNOSIS — Z5112 Encounter for antineoplastic immunotherapy: Secondary | ICD-10-CM | POA: Diagnosis not present

## 2022-12-23 DIAGNOSIS — Z95828 Presence of other vascular implants and grafts: Secondary | ICD-10-CM

## 2022-12-23 MED ORDER — PEGFILGRASTIM-JMDB 6 MG/0.6ML ~~LOC~~ SOSY
6.0000 mg | PREFILLED_SYRINGE | Freq: Once | SUBCUTANEOUS | Status: AC
  Administered 2022-12-23: 6 mg via SUBCUTANEOUS

## 2022-12-23 MED ORDER — SODIUM CHLORIDE 0.9% FLUSH
10.0000 mL | Freq: Once | INTRAVENOUS | Status: AC
  Administered 2022-12-23: 10 mL

## 2022-12-23 MED ORDER — HEPARIN SOD (PORK) LOCK FLUSH 100 UNIT/ML IV SOLN
500.0000 [IU] | Freq: Once | INTRAVENOUS | Status: AC
  Administered 2022-12-23: 500 [IU]

## 2023-01-02 NOTE — Progress Notes (Unsigned)
[place]   [phone] [phone]   Clinic Follow up Note   Patient Care Team: Pcp, No as PCP - General Pickenpack-Cousar, [name], NP as Nurse Practitioner (Nurse Practitioner) [name], MD as Consulting Physician (Hematology)  Date of Service:  01/04/2023  CHIEF COMPLAINT: f/u of metastatic rectal cancer    CURRENT THERAPY:  Second-line FOLFIRI, q2weeks, started 02/22/22 -Bevacizumab started 11/24/21   ASSESSMENT:  Jorge [name] is a 72 y.o. male with   Rectal cancer ([facility]) [id number] with lung metastasis, MMR normal, KRAS(+) [id number]  -diagnosed 08/2021 by colonoscopy for rectal bleeding and frequent BM. Staging MRI showed early extra mesorectal lymph node involvement.  -baseline CEA 571.84 on 09/06/21.  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 Xeloda dose reduced due to elevated liver enzymes.  -due to new liver lesions, we switched to second-line FOLFIRI, with continued beva, on 02/21/22. He tolerates well overall.  - his recent restaging CT scan from 11/21/2022 showed stable lung and liver metastasis, no new lesions.   -Will continue current chemotherapy.  We also discussed maintenance irinotecan along, since he is tolerating FOLFIRI well, will continue for now.  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 He is on gabapentin and cymbalta, taking 2-3 oxycodone a day. He is followed by NP Lexine Baton.      PLAN: -Lab reviewed, adequate for treatment, will proceed chemotherapy today -Follow-up in 2 weeks before next cycle -Plan to repeat a CT scan in April   SUMMARY OF ONCOLOGIC HISTORY: Oncology History Overview Note  Cancer Staging Rectal cancer [facility]) Staging form: Colon and Rectum, AJCC 8th Edition - Clinical stage from 08/29/2021: Jorge [name], cM1 -  Signed by [name], MD on 08/29/2021    Rectal cancer ([place])  07/23/2021 Imaging   CT AP  IMPRESSION: 1. Appearance of the rectum likely indicates a rectal mass suspicious for rectal carcinoma. Inflammatory process would be less likely. Direct visualization is suggested. 2. Cholelithiasis without evidence of acute cholecystitis. 3. Aortic atherosclerosis.   08/19/2021 Procedure   Colonoscopy, under Dr. [name]  Impression: - Rectal tenderness, palpable rectal mass and hemorrhoids found on digital rectal exam. - Stool in the entire examined colon - lavaged with still inadequate clearance. - One 35 mm polyp at the hepatic flexure. Biopsied. Tattooed distal in case future endoscopic resection is considered - however, has larger issues with rectal mass currently as below. - Four, 5 to 11 mm polyps in the transverse colon and at the hepatic flexure, removed with a cold snare. Resected and retrieved. - Diverticulosis in the recto-sigmoid colon and in the sigmoid colon. - Rule out malignancy, partially obstructing tumor in the rectum and from 6 to 13 cm proximal to the anus. Biopsied.   08/19/2021 Pathology Results   Diagnosis 1. Hepatic Flexure Biopsy - TUBULAR ADENOMA WITHOUT HIGH-GRADE DYSPLASIA OR MALIGNANCY 2. Transverse Colon Biopsy, and hepatic flexure, polyps (4) - TUBULAR ADENOMA WITHOUT HIGH-GRADE DYSPLASIA OR MALIGNANCY - OTHER FRAGMENTS OF POLYPOID COLONIC MUCOSA WITH NO SPECIFIC HISTOPATHOLOGIC CHANGES - FOOD MATERIAL 3. Rectum, biopsy - ADENOCARCINOMA. SEE NOTE   08/22/2021 Initial Diagnosis   Rectal cancer ([place])   08/26/2021 Imaging   IMPRESSION: 1. A 2.2 x 1.9 cm right middle lobe pulmonary nodule as well as a total of four right upper lobe pulmonary nodule and masses measuring up to 3.6 cm. Findings concerning for  metastatic primary lung cancer versus less likely metastases in a patient with rectal cancer. Additional imaging evaluation or consultation with  Pulmonology or Thoracic Surgery recommended. 2. No gross hilar adenopathy, noting limited sensitivity for the detection of hilar adenopathy on this noncontrast study. 3. Cholelithiasis. 4.  Emphysema (ICD10-J43.9). 5. At least left anterior descending coronary artery calcifications.   08/27/2021 Imaging   IMPRESSION: Rectal adenocarcinoma T stage: T4 B   Rectal adenocarcinoma N stage: N2 disease likely associated with early extra mesorectal lymph node involvement.   Distance from tumor to the internal anal sphincter is 1.2 cm.   Also with extramural venous involvement as described.   08/29/2021 Cancer Staging   Staging form: Colon and Rectum, AJCC 8th Edition - Clinical stage from 08/29/2021: Jorge [name], cM1 - Signed by [name], MD on 08/29/2021 Stage prefix: Initial diagnosis   01/23/2022 Imaging   CT CAP w contrast IMPRESSION: 1. Mild interval decrease in size of multiple right-sided pulmonary nodules. No new suspicious pulmonary nodule or mass. 2. Interval development of approximately 10 new ill-defined hypoattenuating liver lesions ranging in size from approximately 5-10 mm. Imaging features suspicious for metastatic disease. MRI abdomen with and without contrast may prove helpful to further evaluate. 3. Similar appearance of wall thickening in the rectum with perirectal edema. 4. Cholelithiasis. 5. Prostatomegaly. 6. Aortic Atherosclerosis (ICD10-I70.0).   11/21/2022 Imaging    IMPRESSION: 1. Stable exam. No new or progressive interval findings. 2. The primary rectosigmoid lesion described previously is not well seen on the current study. There is presacral and perirectal edema, likely treatment related. 3. No substantial change in appearance of bilateral pulmonary metastases. 4. Stable tiny hypodensities in both hepatic lobes. These were previously characterized as metastatic disease. No new liver lesions on today's exam. 5. Cholelithiasis. 6. 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  He was last seen by me on 12/21/2022 He presents to the clinic alone.  He had a diarrhea when he had the last cycle chemotherapy 2 weeks ago, and diarrhea lasted about 6 to 7 days.  He has been feeding much better in the past week, and has gained some weight back.  He is weight is 7 to 8 pounds lower than last visit in 2 weeks.     All other systems were reviewed with the patient and are negative.  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 ([place]) 08/19/2021    SURGICAL HISTORY: Past Surgical History:  Procedure Laterality Date   BRONCHIAL BIOPSY  09/27/2021   Procedure: BRONCHIAL BIOPSIES;  Surgeon: Garner Nash, DO;  Location: [facility];  Service: Pulmonary;;   BRONCHIAL BRUSHINGS  09/27/2021   Procedure: BRONCHIAL BRUSHINGS;  Surgeon: Garner Nash, DO;  Location: [facility];  Service: Pulmonary;;   BRONCHIAL NEEDLE ASPIRATION BIOPSY  09/27/2021  Procedure: BRONCHIAL NEEDLE ASPIRATION BIOPSIES;  Surgeon: Garner Nash, DO;  Location: [facility];  Service: Pulmonary;;   COLONOSCOPY     IR IMAGING GUIDED PORT INSERTION  10/24/2021   left breast surgery Left 1968   VIDEO BRONCHOSCOPY WITH RADIAL ENDOBRONCHIAL ULTRASOUND  09/27/2021   Procedure: RADIAL ENDOBRONCHIAL ULTRASOUND;   Surgeon: Garner Nash, DO;  Location: [facility];  Service: Pulmonary;;    I have reviewed the social history and family history with the patient and they are unchanged from previous note.  ALLERGIES:  has No Known Allergies.  MEDICATIONS:  Current Outpatient Medications  Medication Sig Dispense Refill   amLODipine (NORVASC) 10 MG tablet Take 1 tablet (10 mg total) by mouth daily. 30 tablet 1   carbonyl iron (FEOSOL) 45 MG TABS tablet Take 45 mg by mouth daily.     diphenoxylate-atropine (LOMOTIL) 2.5-0.025 MG tablet Take 1-2 tablets by mouth 4 (four) times daily as needed for diarrhea or loose stools. 60 tablet 0   DULoxetine (CYMBALTA) 20 MG capsule Take 1 capsule (20 mg total) by mouth daily. 30 capsule 1   gabapentin (NEURONTIN) 100 MG capsule Take 2 capsules (200 mg total) by mouth 3 (three) times daily. 180 capsule 1   Multiple Vitamin (MULTIVITAMIN WITH MINERALS) TABS tablet Take 1 tablet by mouth daily.     ondansetron (ZOFRAN) 8 MG tablet Take 1 tablet (8 mg total) by mouth every 8 (eight) hours as needed for nausea or vomiting. 30 tablet 0   oxyCODONE (OXY IR/ROXICODONE) 5 MG immediate release tablet Take 1 tablet (5 mg total) by mouth every 8 (eight) hours as needed for severe pain or breakthrough pain. 45 tablet 0   No current facility-administered medications for this visit.   Facility-Administered Medications Ordered in Other Visits  Medication Dose Route Frequency Provider Last Rate Last Admin   bevacizumab-adcd (VEGZELMA) 350 mg in sodium chloride 0.9 % 100 mL chemo infusion  5 mg/kg (Treatment Plan Recorded) Intravenous Once [name], MD       fluorouracil (ADRUCIL) 4,450 mg in sodium chloride 0.9 % 61 mL chemo infusion  2,400 mg/m2 (Treatment Plan Recorded) Intravenous 1 day or 1 dose [name], MD       irinotecan (CAMPTOSAR) 300 mg in sodium chloride 0.9 % 500 mL chemo infusion  160 mg/m2 (Treatment Plan Recorded) Intravenous Once [name], MD       leucovorin  740 mg in sodium chloride 0.9 % 250 mL infusion  400 mg/m2 (Treatment Plan Recorded) Intravenous Once [name], MD       palonosetron (ALOXI) injection 0.25 mg  0.25 mg Intravenous Once [name], MD        PHYSICAL EXAMINATION: ECOG PERFORMANCE STATUS: 1 - Symptomatic but completely ambulatory  Vitals:   01/04/23 0916  BP: (!) 156/85  Pulse: 87  Resp: 16  Temp: 98.6 F (37 C)  SpO2: 100%   Wt Readings from Last 3 Encounters:  01/04/23 140 lb 14.4 oz (63.9 kg)  12/21/22 149 lb (67.6 kg)  12/20/22 149 lb (67.6 kg)     GENERAL:alert, no distress and comfortable SKIN: skin color, texture, turgor are normal, no rashes or significant lesions EYES: normal, Conjunctiva are pink and non-injected, sclera clear NECK: supple, thyroid normal size, non-tender, without nodularity LYMPH:  no palpable lymphadenopathy in the cervical, axillary  LUNGS: clear to auscultation and percussion with normal breathing effort HEART: regular rate & rhythm and no murmurs and no lower extremity edema ABDOMEN:abdomen soft, non-tender  and normal bowel sounds Musculoskeletal:no cyanosis of digits and no clubbing  NEURO: alert & oriented x 3 with fluent speech, no focal motor/sensory deficits  LABORATORY DATA:  I have reviewed the data as listed    Latest Ref Rng & Units 01/04/2023    8:49 AM 12/20/2022    9:00 AM 12/06/2022    1:35 PM  CBC  WBC 4.0 - 10.5 K/uL 5.9  6.0  6.5   Hemoglobin 13.0 - 17.0 g/dL 13.8  14.9  13.6   Hematocrit 39.0 - 52.0 % 41.6  47.9  40.9   Platelets 150 - 400 K/uL 162  225  186         Latest Ref Rng & Units 01/04/2023    8:49 AM 12/20/2022   10:30 AM 12/06/2022    1:35 PM  CMP  Glucose 70 - 99 mg/dL 137  109  84   BUN 8 - 23 mg/dL '9  8  10   '$ Creatinine 0.61 - 1.24 mg/dL 0.79  0.83  0.79   Sodium 135 - 145 mmol/L 136  137  137   Potassium 3.5 - 5.1 mmol/L 4.1  3.8  4.4   Chloride 98 - 111 mmol/L 102  103  104   CO2 22 - 32 mmol/L '29  27  30   '$ Calcium 8.9 - 10.3 mg/dL  8.5  8.7  8.9   Total Protein 6.5 - 8.1 g/dL 6.4  6.7  6.9   Total Bilirubin 0.3 - 1.2 mg/dL 0.3  0.8  0.3   Alkaline Phos 38 - 126 U/L 161  126  161   AST 15 - 41 U/L '20  25  22   '$ ALT 0 - 44 U/L '15  16  16       '$ RADIOGRAPHIC STUDIES: I have personally reviewed the radiological images as listed and agreed with the findings in the report. No results found.    Orders Placed This Encounter  Procedures   CBC with Differential ([place] Only)    Standing Status:   Future    Standing Expiration Date:   02/22/2024   CMP ([place] only)    Standing Status:   Future    Standing Expiration Date:   02/22/2024   Total Protein, Urine dipstick    Standing Status:   Future    Standing Expiration Date:   02/22/2024   All questions were answered. The patient knows to call the clinic with any problems, questions or concerns. No barriers to learning was detected. The total time spent in the appointment was 30 minutes.     [name], MD 01/04/2023   Felicity Coyer, CMA, am acting as scribe for [name], MD.   I have reviewed the above documentation for accuracy and completeness, and I agree with the above.

## 2023-01-03 MED FILL — Dexamethasone Sodium Phosphate Inj 100 MG/10ML: INTRAMUSCULAR | Qty: 1 | Status: AC

## 2023-01-03 NOTE — Assessment & Plan Note (Signed)
-  [id number] with lung metastasis, MMR normal, KRAS(+) [id number]  -diagnosed 08/2021 by colonoscopy for rectal bleeding and frequent BM. Staging MRI showed early extra mesorectal lymph node involvement.  -baseline CEA 571.84 on 09/06/21.  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 Xeloda dose reduced due to elevated liver enzymes.  -due to new liver lesions, we switched to second-line FOLFIRI, with continued beva, on 02/21/22. He tolerates well overall.  - his recent restaging CT scan from 11/21/2022 showed stable lung and liver metastasis, no new lesions.   -Will continue current chemotherapy.  We also discussed maintenance irinotecan along, since he is tolerating FOLFIRI well, will continue for now.

## 2023-01-03 NOTE — Assessment & Plan Note (Signed)
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 He is on gabapentin and cymbalta, taking 2-3 oxycodone a day. He is followed by NP Lexine Baton.

## 2023-01-04 ENCOUNTER — Encounter: Payer: Self-pay | Admitting: Hematology

## 2023-01-04 ENCOUNTER — Other Ambulatory Visit: Payer: Self-pay | Admitting: Nurse Practitioner

## 2023-01-04 ENCOUNTER — Inpatient Hospital Stay: Payer: Medicare Other

## 2023-01-04 ENCOUNTER — Other Ambulatory Visit: Payer: Self-pay

## 2023-01-04 ENCOUNTER — Inpatient Hospital Stay (HOSPITAL_BASED_OUTPATIENT_CLINIC_OR_DEPARTMENT_OTHER): Payer: Medicare Other | Admitting: Hematology

## 2023-01-04 VITALS — BP 156/85 | HR 87 | Temp 98.6°F | Resp 16 | Ht 72.0 in | Wt 140.9 lb

## 2023-01-04 VITALS — BP 154/83 | HR 84 | Resp 18

## 2023-01-04 DIAGNOSIS — M792 Neuralgia and neuritis, unspecified: Secondary | ICD-10-CM

## 2023-01-04 DIAGNOSIS — T451X5A Adverse effect of antineoplastic and immunosuppressive drugs, initial encounter: Secondary | ICD-10-CM | POA: Diagnosis not present

## 2023-01-04 DIAGNOSIS — Z5112 Encounter for antineoplastic immunotherapy: Secondary | ICD-10-CM | POA: Diagnosis not present

## 2023-01-04 DIAGNOSIS — G893 Neoplasm related pain (acute) (chronic): Secondary | ICD-10-CM

## 2023-01-04 DIAGNOSIS — R110 Nausea: Secondary | ICD-10-CM

## 2023-01-04 DIAGNOSIS — C7800 Secondary malignant neoplasm of unspecified lung: Secondary | ICD-10-CM

## 2023-01-04 DIAGNOSIS — C20 Malignant neoplasm of rectum: Secondary | ICD-10-CM

## 2023-01-04 DIAGNOSIS — Z515 Encounter for palliative care: Secondary | ICD-10-CM

## 2023-01-04 DIAGNOSIS — R197 Diarrhea, unspecified: Secondary | ICD-10-CM

## 2023-01-04 DIAGNOSIS — Z95828 Presence of other vascular implants and grafts: Secondary | ICD-10-CM

## 2023-01-04 DIAGNOSIS — G620 Drug-induced polyneuropathy: Secondary | ICD-10-CM | POA: Diagnosis not present

## 2023-01-04 LAB — CBC WITH DIFFERENTIAL (CANCER CENTER ONLY)
Abs Immature Granulocytes: 0.03 10*3/uL (ref 0.00–0.07)
Basophils Absolute: 0 10*3/uL (ref 0.0–0.1)
Basophils Relative: 1 %
Eosinophils Absolute: 0.2 10*3/uL (ref 0.0–0.5)
Eosinophils Relative: 3 %
HCT: 41.6 % (ref 39.0–52.0)
Hemoglobin: 13.8 g/dL (ref 13.0–17.0)
Immature Granulocytes: 1 %
Lymphocytes Relative: 11 %
Lymphs Abs: 0.6 10*3/uL — ABNORMAL LOW (ref 0.7–4.0)
MCH: 28.8 pg (ref 26.0–34.0)
MCHC: 33.2 g/dL (ref 30.0–36.0)
MCV: 86.7 fL (ref 80.0–100.0)
Monocytes Absolute: 1.1 10*3/uL — ABNORMAL HIGH (ref 0.1–1.0)
Monocytes Relative: 19 %
Neutro Abs: 4 10*3/uL (ref 1.7–7.7)
Neutrophils Relative %: 65 %
Platelet Count: 162 10*3/uL (ref 150–400)
RBC: 4.8 MIL/uL (ref 4.22–5.81)
RDW: 17.1 % — ABNORMAL HIGH (ref 11.5–15.5)
WBC Count: 5.9 10*3/uL (ref 4.0–10.5)
nRBC: 0 % (ref 0.0–0.2)

## 2023-01-04 LAB — CMP (CANCER CENTER ONLY)
ALT: 15 U/L (ref 0–44)
AST: 20 U/L (ref 15–41)
Albumin: 3.6 g/dL (ref 3.5–5.0)
Alkaline Phosphatase: 161 U/L — ABNORMAL HIGH (ref 38–126)
Anion gap: 5 (ref 5–15)
BUN: 9 mg/dL (ref 8–23)
CO2: 29 mmol/L (ref 22–32)
Calcium: 8.5 mg/dL — ABNORMAL LOW (ref 8.9–10.3)
Chloride: 102 mmol/L (ref 98–111)
Creatinine: 0.79 mg/dL (ref 0.61–1.24)
GFR, Estimated: >60 mL/min (ref >60)
Glucose, Bld: 137 mg/dL — ABNORMAL HIGH (ref 70–99)
Potassium: 4.1 mmol/L (ref 3.5–5.1)
Sodium: 136 mmol/L (ref 135–145)
Total Bilirubin: 0.3 mg/dL (ref 0.3–1.2)
Total Protein: 6.4 g/dL — ABNORMAL LOW (ref 6.5–8.1)

## 2023-01-04 LAB — TOTAL PROTEIN, URINE DIPSTICK: Protein, ur: 30 mg/dL — AB

## 2023-01-04 MED ORDER — SODIUM CHLORIDE 0.9 % IV SOLN
5.0000 mg/kg | Freq: Once | INTRAVENOUS | Status: AC
  Administered 2023-01-04: 350 mg via INTRAVENOUS
  Filled 2023-01-04: qty 14

## 2023-01-04 MED ORDER — DULOXETINE HCL 20 MG PO CPEP: 20.0000 mg | ORAL_CAPSULE | Freq: Every day | ORAL | 1 refills | Status: DC

## 2023-01-04 MED ORDER — SODIUM CHLORIDE 0.9 % IV SOLN
2400.0000 mg/m2 | INTRAVENOUS | Status: DC
  Administered 2023-01-04: 4450 mg via INTRAVENOUS
  Filled 2023-01-04: qty 89

## 2023-01-04 MED ORDER — GABAPENTIN 100 MG PO CAPS: 200.0000 mg | ORAL_CAPSULE | Freq: Three times a day (TID) | ORAL | 1 refills | Status: DC

## 2023-01-04 MED ORDER — SODIUM CHLORIDE 0.9 % IV SOLN
160.0000 mg/m2 | Freq: Once | INTRAVENOUS | Status: AC
  Administered 2023-01-04: 300 mg via INTRAVENOUS
  Filled 2023-01-04: qty 15

## 2023-01-04 MED ORDER — ONDANSETRON HCL 8 MG PO TABS: 8.0000 mg | ORAL_TABLET | Freq: Three times a day (TID) | ORAL | 3 refills | Status: DC | PRN

## 2023-01-04 MED ORDER — PALONOSETRON HCL INJECTION 0.25 MG/5ML
0.2500 mg | Freq: Once | INTRAVENOUS | Status: AC
  Administered 2023-01-04: 0.25 mg via INTRAVENOUS
  Filled 2023-01-04: qty 5

## 2023-01-04 MED ORDER — DIPHENOXYLATE-ATROPINE 2.5-0.025 MG PO TABS: 1.0000 | ORAL_TABLET | Freq: Four times a day (QID) | ORAL | 1 refills | Status: DC | PRN

## 2023-01-04 MED ORDER — OXYCODONE HCL 5 MG PO TABS: 5.0000 mg | ORAL_TABLET | Freq: Three times a day (TID) | ORAL | 0 refills | Status: DC | PRN

## 2023-01-04 MED ORDER — SODIUM CHLORIDE 0.9% FLUSH
10.0000 mL | Freq: Once | INTRAVENOUS | Status: AC
  Administered 2023-01-04: 10 mL

## 2023-01-04 MED ORDER — SODIUM CHLORIDE 0.9 % IV SOLN: Freq: Once | INTRAVENOUS | Status: AC

## 2023-01-04 MED ORDER — ATROPINE SULFATE 1 MG/ML IV SOLN
0.5000 mg | Freq: Once | INTRAVENOUS | Status: AC | PRN
  Administered 2023-01-04: 0.5 mg via INTRAVENOUS
  Filled 2023-01-04: qty 1

## 2023-01-04 MED ORDER — SODIUM CHLORIDE 0.9 % IV SOLN
400.0000 mg/m2 | Freq: Once | INTRAVENOUS | Status: AC
  Administered 2023-01-04: 740 mg via INTRAVENOUS
  Filled 2023-01-04: qty 37

## 2023-01-04 MED ORDER — SODIUM CHLORIDE 0.9 % IV SOLN
10.0000 mg | Freq: Once | INTRAVENOUS | Status: AC
  Administered 2023-01-04: 10 mg via INTRAVENOUS
  Filled 2023-01-04: qty 10

## 2023-01-04 NOTE — Telephone Encounter (Signed)
From: [name] To: Jobe Gibbon, NP Sent: 01/04/2023 8:28 AM EST Subject: Medication Renewal Request  Refills have been requested for the following medications:   ondansetron (ZOFRAN) 8 MG tablet [Tyeisha Dinan Pickenpack-Cousar]   diphenoxylate-atropine (LOMOTIL) 2.5-0.025 MG tablet [Marinna Blane Pickenpack-Cousar]   oxyCODONE (OXY IR/ROXICODONE) 5 MG immediate release tablet Chesley Noon Pickenpack-Cousar]  [id number] [address] [place] method: [employer]

## 2023-01-06 ENCOUNTER — Inpatient Hospital Stay: Payer: Medicare Other | Attending: Physician Assistant

## 2023-01-06 VITALS — BP 133/62 | HR 88 | Temp 100.8°F | Resp 17

## 2023-01-06 DIAGNOSIS — Z5112 Encounter for antineoplastic immunotherapy: Secondary | ICD-10-CM | POA: Diagnosis not present

## 2023-01-06 DIAGNOSIS — C7801 Secondary malignant neoplasm of right lung: Secondary | ICD-10-CM | POA: Insufficient documentation

## 2023-01-06 DIAGNOSIS — C787 Secondary malignant neoplasm of liver and intrahepatic bile duct: Secondary | ICD-10-CM | POA: Diagnosis not present

## 2023-01-06 DIAGNOSIS — Z5189 Encounter for other specified aftercare: Secondary | ICD-10-CM | POA: Diagnosis not present

## 2023-01-06 DIAGNOSIS — Z5111 Encounter for antineoplastic chemotherapy: Secondary | ICD-10-CM | POA: Diagnosis present

## 2023-01-06 DIAGNOSIS — C20 Malignant neoplasm of rectum: Secondary | ICD-10-CM | POA: Insufficient documentation

## 2023-01-06 MED ORDER — PEGFILGRASTIM-JMDB 6 MG/0.6ML ~~LOC~~ SOSY
6.0000 mg | PREFILLED_SYRINGE | Freq: Once | SUBCUTANEOUS | Status: AC
  Administered 2023-01-06: 6 mg via SUBCUTANEOUS

## 2023-01-06 MED ORDER — SODIUM CHLORIDE 0.9% FLUSH
10.0000 mL | INTRAVENOUS | Status: DC | PRN
  Administered 2023-01-06: 10 mL

## 2023-01-06 MED ORDER — HEPARIN SOD (PORK) LOCK FLUSH 100 UNIT/ML IV SOLN
500.0000 [IU] | Freq: Once | INTRAVENOUS | Status: AC | PRN
  Administered 2023-01-06: 500 [IU]

## 2023-01-17 MED FILL — Dexamethasone Sodium Phosphate Inj 100 MG/10ML: INTRAMUSCULAR | Qty: 1 | Status: AC

## 2023-01-17 NOTE — Assessment & Plan Note (Signed)
-  [id number] with lung metastasis, MMR normal, KRAS(+) [id number]  -diagnosed 08/2021 by colonoscopy for rectal bleeding and frequent BM. Staging MRI showed early extra mesorectal lymph node involvement.  -baseline CEA 571.84 on 09/06/21.  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 Xeloda dose reduced due to elevated liver enzymes.  -due to new liver lesions, we switched to second-line FOLFIRI, with continued beva, on 02/21/22. He tolerates well overall.  - his recent restaging CT scan from 11/21/2022 showed stable lung and liver metastasis, no new lesions.   -Will continue current chemotherapy.  We previously discussed maintenance irinotecan alone, since he is tolerating FOLFIRI well, will continue for now.

## 2023-01-17 NOTE — Assessment & Plan Note (Signed)
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 He is on gabapentin and cymbalta, taking 2-3 oxycodone a day. He is followed by NP Nikki.    

## 2023-01-17 NOTE — Progress Notes (Unsigned)
[place]   Telephone:(336) [phone] Fax:(336) [phone]   Clinic Follow up Note   Patient Care Team: Pcp, No as PCP - General Pickenpack-Cousar, [name], NP as Nurse Practitioner (Nurse Practitioner) [name], MD as Consulting Physician (Hematology)  Date of Service:  01/18/2023  CHIEF COMPLAINT: f/u of metastatic rectal cancer      CURRENT THERAPY:  Second-line FOLFIRI, q2weeks, started 02/22/22 -Bevacizumab started 11/24/21     ASSESSMENT:  Jorge [name] is a 72 y.o. male with   Rectal cancer ([place]) [id number] with lung metastasis, MMR normal, KRAS(+) [id number]  -diagnosed 08/2021 by colonoscopy for rectal bleeding and frequent BM. Staging MRI showed early extra mesorectal lymph node involvement.  -baseline CEA 571.84 on 09/06/21.  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 Xeloda dose reduced due to elevated liver enzymes.  -due to new liver lesions, we switched to second-line FOLFIRI, with continued beva, on 02/21/22. He tolerates well overall.  - his recent restaging CT scan from 11/21/2022 showed stable lung and liver metastasis, no new lesions.   -Will continue current chemotherapy.  We previously discussed maintenance irinotecan alone, since he is tolerating FOLFIRI well, will continue for now. 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 He is on gabapentin and cymbalta, taking 2-3 oxycodone a day. He is followed by NP Lexine Baton.      PLAN: -I refill amlodipine -I reviewed the Med List w/ pt -proceed with  C14 FOLFIRI+ beva at reduce dose of irinotecan to '140mg'$ /m2 due to diarrhea -LAB,F/U and chemo 02/08/2023 (postpone for a week)  SUMMARY OF ONCOLOGIC HISTORY: Oncology History Overview Note  Cancer  Staging Rectal cancer [facility]) Staging form: Colon and Rectum, AJCC 8th Edition - Clinical stage from 08/29/2021: [name], cM1 - Signed by [name], MD on 08/29/2021    Rectal cancer ([place])  07/23/2021 Imaging   CT AP  IMPRESSION: 1. Appearance of the rectum likely indicates a rectal mass suspicious for rectal carcinoma. Inflammatory process would be less likely. Direct visualization is suggested. 2. Cholelithiasis without evidence of acute cholecystitis. 3. Aortic atherosclerosis.   08/19/2021 Procedure   Colonoscopy, under Dr. [name]  Impression: - Rectal tenderness, palpable rectal mass and hemorrhoids found on digital rectal exam. - Stool in the entire examined colon - lavaged with still inadequate clearance. - One 35 mm polyp at the hepatic flexure. Biopsied. Tattooed distal in case future endoscopic resection is considered - however, has larger issues with rectal mass currently as below. - Four, 5 to 11 mm polyps in the transverse colon and at the hepatic flexure, removed with a cold snare. Resected and retrieved. - Diverticulosis in the recto-sigmoid colon and in the sigmoid colon. - Rule out malignancy, partially obstructing tumor in the rectum and from 6 to 13 cm proximal to the anus. Biopsied.   08/19/2021 Pathology Results   Diagnosis 1. Hepatic Flexure Biopsy - TUBULAR ADENOMA WITHOUT HIGH-GRADE DYSPLASIA OR MALIGNANCY 2. Transverse Colon Biopsy, and hepatic flexure, polyps (4) - TUBULAR ADENOMA WITHOUT HIGH-GRADE DYSPLASIA OR MALIGNANCY - OTHER FRAGMENTS OF POLYPOID COLONIC MUCOSA WITH NO SPECIFIC HISTOPATHOLOGIC CHANGES - FOOD MATERIAL 3. Rectum, biopsy - ADENOCARCINOMA. SEE NOTE   08/22/2021 Initial Diagnosis   Rectal cancer ([place])   08/26/2021 Imaging   IMPRESSION: 1. A 2.2 x 1.9 cm right middle lobe  pulmonary nodule as well as a total of four right upper lobe pulmonary nodule and masses measuring up to 3.6 cm. Findings concerning for metastatic primary  lung cancer versus less likely metastases in a patient with rectal cancer. Additional imaging evaluation or consultation with Pulmonology or Thoracic Surgery recommended. 2. No gross hilar adenopathy, noting limited sensitivity for the detection of hilar adenopathy on this noncontrast study. 3. Cholelithiasis. 4.  Emphysema (ICD10-J43.9). 5. At least left anterior descending coronary artery calcifications.   08/27/2021 Imaging   IMPRESSION: Rectal adenocarcinoma T stage: T4 B   Rectal adenocarcinoma N stage: N2 disease likely associated with early extra mesorectal lymph node involvement.   Distance from tumor to the internal anal sphincter is 1.2 cm.   Also with extramural venous involvement as described.   08/29/2021 Cancer Staging   Staging form: Colon and Rectum, AJCC 8th Edition - Clinical stage from 08/29/2021: [name], cM1 - Signed by [name], MD on 08/29/2021 Stage prefix: Initial diagnosis   01/23/2022 Imaging   CT CAP w contrast IMPRESSION: 1. Mild interval decrease in size of multiple right-sided pulmonary nodules. No new suspicious pulmonary nodule or mass. 2. Interval development of approximately 10 new ill-defined hypoattenuating liver lesions ranging in size from approximately 5-10 mm. Imaging features suspicious for metastatic disease. MRI abdomen with and without contrast may prove helpful to further evaluate. 3. Similar appearance of wall thickening in the rectum with perirectal edema. 4. Cholelithiasis. 5. Prostatomegaly. 6. Aortic Atherosclerosis (ICD10-I70.0).   11/21/2022 Imaging    IMPRESSION: 1. Stable exam. No new or progressive interval findings. 2. The primary rectosigmoid lesion described previously is not well seen on the current study. There is presacral and perirectal edema, likely treatment related. 3. No substantial change in appearance of bilateral pulmonary metastases. 4. Stable tiny hypodensities in both hepatic lobes. These  were previously characterized as metastatic disease. No new liver lesions on today's exam. 5. Cholelithiasis. 6. 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 Pt stated he ha been feeling good. Pt states after receiving treatment he had diarrhea for three days. Pt state he too lomotil for the symptoms. Pt reports that the diarrhea really hurts for as stomach and rectal burning.Pt state that his neuropathy is a little better.    All other systems were reviewed with the patient and are negative.  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 ([place]) 08/19/2021    SURGICAL HISTORY: Past Surgical History:  Procedure Laterality Date   BRONCHIAL BIOPSY  09/27/2021   Procedure: BRONCHIAL BIOPSIES;  Surgeon: Garner Nash, DO;  Location: [facility];  Service: Pulmonary;;   BRONCHIAL BRUSHINGS  09/27/2021   Procedure: BRONCHIAL BRUSHINGS;  Surgeon: Garner Nash,  DO;  Location: [facility];  Service: Pulmonary;;   BRONCHIAL NEEDLE ASPIRATION BIOPSY  09/27/2021   Procedure: BRONCHIAL NEEDLE ASPIRATION BIOPSIES;  Surgeon: Garner Nash, DO;  Location: [facility];  Service: Pulmonary;;   COLONOSCOPY     IR IMAGING GUIDED PORT INSERTION  10/24/2021   left breast  surgery Left 1968   VIDEO BRONCHOSCOPY WITH RADIAL ENDOBRONCHIAL ULTRASOUND  09/27/2021   Procedure: RADIAL ENDOBRONCHIAL ULTRASOUND;  Surgeon: Garner Nash, DO;  Location: [facility];  Service: Pulmonary;;    I have reviewed the social history and family history with the patient and they are unchanged from previous note.  ALLERGIES:  has No Known Allergies.  MEDICATIONS:  Current Outpatient Medications  Medication Sig Dispense Refill   amLODipine (NORVASC) 10 MG tablet Take 1 tablet (10 mg total) by mouth daily. 30 tablet 1   carbonyl iron (FEOSOL) 45 MG TABS tablet Take 45 mg by mouth daily.     diphenoxylate-atropine (LOMOTIL) 2.5-0.025 MG tablet Take 1-2 tablets by mouth 4 (four) times daily as needed for diarrhea or loose stools. 90 tablet 1   DULoxetine (CYMBALTA) 20 MG capsule Take 1 capsule (20 mg total) by mouth daily. 30 capsule 1   gabapentin (NEURONTIN) 100 MG capsule Take 2 capsules (200 mg total) by mouth 3 (three) times daily. 180 capsule 1   Multiple Vitamin (MULTIVITAMIN WITH MINERALS) TABS tablet Take 1 tablet by mouth daily.     ondansetron (ZOFRAN) 8 MG tablet Take 1 tablet (8 mg total) by mouth every 8 (eight) hours as needed for nausea or vomiting. 30 tablet 3   oxyCODONE (OXY IR/ROXICODONE) 5 MG immediate release tablet Take 1 tablet (5 mg total) by mouth every 8 (eight) hours as needed for severe pain or breakthrough pain. 45 tablet 0   No current facility-administered medications for this visit.    PHYSICAL EXAMINATION: ECOG PERFORMANCE STATUS: 1 - Symptomatic but completely ambulatory  Vitals:   01/18/23 0822  BP: (!) 162/89  Pulse: 66  Resp: 17  Temp: 97.9 F (36.6 C)  SpO2: 100%   Wt Readings from Last 3 Encounters:  01/18/23 143 lb 12.8 oz (65.2 kg)  01/04/23 140 lb 14.4 oz (63.9 kg)  12/21/22 149 lb (67.6 kg)     GENERAL:alert, no distress and comfortable SKIN: skin color normal, no rashes or significant lesions EYES: normal, Conjunctiva  are pink and non-injected, sclera clear  NEURO: alert & oriented x 3 with fluent speech   LABORATORY DATA:  I have reviewed the data as listed    Latest Ref Rng & Units 01/18/2023    7:58 AM 01/04/2023    8:49 AM 12/20/2022    9:00 AM  CBC  WBC 4.0 - 10.5 K/uL 5.7  5.9  6.0   Hemoglobin 13.0 - 17.0 g/dL 13.3  13.8  14.9   Hematocrit 39.0 - 52.0 % 41.4  41.6  47.9   Platelets 150 - 400 K/uL 199  162  225         Latest Ref Rng & Units 01/18/2023    7:58 AM 01/04/2023    8:49 AM 12/20/2022   10:30 AM  CMP  Glucose 70 - 99 mg/dL 104  137  109   BUN 8 - 23 mg/dL '11  9  8   '$ Creatinine 0.61 - 1.24 mg/dL 0.87  0.79  0.83   Sodium 135 - 145 mmol/L 140  136  137   Potassium 3.5 - 5.1 mmol/L 4.2  4.1  3.8   Chloride 98 - 111 mmol/L 105  102  103   CO2 22 - 32 mmol/L '31  29  27   '$ Calcium 8.9 - 10.3 mg/dL 8.8  8.5  8.7   Total Protein 6.5 - 8.1 g/dL 6.9  6.4  6.7   Total Bilirubin 0.3 - 1.2 mg/dL 0.3  0.3  0.8   Alkaline Phos 38 - 126 U/L 173  161  126   AST 15 - 41 U/L '18  20  25   '$ ALT 0 - 44 U/L '12  15  16       '$ RADIOGRAPHIC STUDIES: I have personally reviewed the radiological images as listed and agreed with the findings in the report. No results found.    Orders Placed This Encounter  Procedures   CBC with Differential ([facility] Only)    Standing Status:   Future    Standing Expiration Date:   03/07/2024   CMP ([facility] only)    Standing Status:   Future    Standing Expiration Date:   03/07/2024   Total Protein, Urine dipstick    Standing Status:   Future    Standing Expiration Date:   03/07/2024   All questions were answered. The patient knows to call the clinic with any problems, questions or concerns. No barriers to learning was detected. The total time spent in the appointment was 30 minutes.     [name], MD 01/18/2023   Felicity Coyer, CMA, am acting as scribe for [name], MD.   I have reviewed the above documentation for accuracy and completeness,  and I agree with the above.

## 2023-01-18 ENCOUNTER — Inpatient Hospital Stay (HOSPITAL_BASED_OUTPATIENT_CLINIC_OR_DEPARTMENT_OTHER): Payer: Medicare Other | Admitting: Hematology

## 2023-01-18 ENCOUNTER — Other Ambulatory Visit: Payer: Self-pay

## 2023-01-18 ENCOUNTER — Inpatient Hospital Stay: Payer: Medicare Other

## 2023-01-18 ENCOUNTER — Encounter: Payer: Self-pay | Admitting: Hematology

## 2023-01-18 VITALS — BP 158/60

## 2023-01-18 VITALS — BP 162/89 | HR 66 | Temp 97.9°F | Resp 17 | Ht 72.0 in | Wt 143.8 lb

## 2023-01-18 DIAGNOSIS — C7801 Secondary malignant neoplasm of right lung: Secondary | ICD-10-CM

## 2023-01-18 DIAGNOSIS — C7800 Secondary malignant neoplasm of unspecified lung: Secondary | ICD-10-CM

## 2023-01-18 DIAGNOSIS — Z5112 Encounter for antineoplastic immunotherapy: Secondary | ICD-10-CM | POA: Diagnosis not present

## 2023-01-18 DIAGNOSIS — C20 Malignant neoplasm of rectum: Secondary | ICD-10-CM

## 2023-01-18 DIAGNOSIS — G620 Drug-induced polyneuropathy: Secondary | ICD-10-CM

## 2023-01-18 DIAGNOSIS — T451X5A Adverse effect of antineoplastic and immunosuppressive drugs, initial encounter: Secondary | ICD-10-CM

## 2023-01-18 DIAGNOSIS — Z95828 Presence of other vascular implants and grafts: Secondary | ICD-10-CM

## 2023-01-18 LAB — CMP (CANCER CENTER ONLY)
ALT: 12 U/L (ref 0–44)
AST: 18 U/L (ref 15–41)
Albumin: 3.5 g/dL (ref 3.5–5.0)
Alkaline Phosphatase: 173 U/L — ABNORMAL HIGH (ref 38–126)
Anion gap: 4 — ABNORMAL LOW (ref 5–15)
BUN: 11 mg/dL (ref 8–23)
CO2: 31 mmol/L (ref 22–32)
Calcium: 8.8 mg/dL — ABNORMAL LOW (ref 8.9–10.3)
Chloride: 105 mmol/L (ref 98–111)
Creatinine: 0.87 mg/dL (ref 0.61–1.24)
GFR, Estimated: >60 mL/min (ref >60)
Glucose, Bld: 104 mg/dL — ABNORMAL HIGH (ref 70–99)
Potassium: 4.2 mmol/L (ref 3.5–5.1)
Sodium: 140 mmol/L (ref 135–145)
Total Bilirubin: 0.3 mg/dL (ref 0.3–1.2)
Total Protein: 6.9 g/dL (ref 6.5–8.1)

## 2023-01-18 LAB — CBC WITH DIFFERENTIAL (CANCER CENTER ONLY)
Abs Immature Granulocytes: 0.03 10*3/uL (ref 0.00–0.07)
Basophils Absolute: 0 10*3/uL (ref 0.0–0.1)
Basophils Relative: 1 %
Eosinophils Absolute: 0.3 10*3/uL (ref 0.0–0.5)
Eosinophils Relative: 6 %
HCT: 41.4 % (ref 39.0–52.0)
Hemoglobin: 13.3 g/dL (ref 13.0–17.0)
Immature Granulocytes: 1 %
Lymphocytes Relative: 14 %
Lymphs Abs: 0.8 10*3/uL (ref 0.7–4.0)
MCH: 28.1 pg (ref 26.0–34.0)
MCHC: 32.1 g/dL (ref 30.0–36.0)
MCV: 87.5 fL (ref 80.0–100.0)
Monocytes Absolute: 0.7 10*3/uL (ref 0.1–1.0)
Monocytes Relative: 13 %
Neutro Abs: 3.7 10*3/uL (ref 1.7–7.7)
Neutrophils Relative %: 65 %
Platelet Count: 199 10*3/uL (ref 150–400)
RBC: 4.73 MIL/uL (ref 4.22–5.81)
RDW: 17 % — ABNORMAL HIGH (ref 11.5–15.5)
WBC Count: 5.7 10*3/uL (ref 4.0–10.5)
nRBC: 0 % (ref 0.0–0.2)

## 2023-01-18 LAB — TOTAL PROTEIN, URINE DIPSTICK: Protein, ur: 30 mg/dL — AB

## 2023-01-18 MED ORDER — SODIUM CHLORIDE 0.9% FLUSH: 10.0000 mL | INTRAVENOUS | Status: DC | PRN

## 2023-01-18 MED ORDER — SODIUM CHLORIDE 0.9 % IV SOLN
5.0000 mg/kg | Freq: Once | INTRAVENOUS | Status: AC
  Administered 2023-01-18: 350 mg via INTRAVENOUS
  Filled 2023-01-18: qty 14

## 2023-01-18 MED ORDER — SODIUM CHLORIDE 0.9 % IV SOLN
2400.0000 mg/m2 | INTRAVENOUS | Status: DC
  Administered 2023-01-18: 4450 mg via INTRAVENOUS
  Filled 2023-01-18: qty 89

## 2023-01-18 MED ORDER — SODIUM CHLORIDE 0.9 % IV SOLN
400.0000 mg/m2 | Freq: Once | INTRAVENOUS | Status: AC
  Administered 2023-01-18: 740 mg via INTRAVENOUS
  Filled 2023-01-18: qty 37

## 2023-01-18 MED ORDER — SODIUM CHLORIDE 0.9 % IV SOLN: Freq: Once | INTRAVENOUS | Status: AC

## 2023-01-18 MED ORDER — HEPARIN SOD (PORK) LOCK FLUSH 100 UNIT/ML IV SOLN: 500.0000 [IU] | Freq: Once | INTRAVENOUS | Status: DC | PRN

## 2023-01-18 MED ORDER — AMLODIPINE BESYLATE 10 MG PO TABS: 10.0000 mg | ORAL_TABLET | Freq: Every day | ORAL | 1 refills | Status: DC

## 2023-01-18 MED ORDER — SODIUM CHLORIDE 0.9 % IV SOLN
10.0000 mg | Freq: Once | INTRAVENOUS | Status: AC
  Administered 2023-01-18: 10 mg via INTRAVENOUS
  Filled 2023-01-18: qty 10

## 2023-01-18 MED ORDER — SODIUM CHLORIDE 0.9% FLUSH
10.0000 mL | Freq: Once | INTRAVENOUS | Status: AC
  Administered 2023-01-18: 10 mL

## 2023-01-18 MED ORDER — SODIUM CHLORIDE 0.9 % IV SOLN
140.0000 mg/m2 | Freq: Once | INTRAVENOUS | Status: AC
  Administered 2023-01-18: 260 mg via INTRAVENOUS
  Filled 2023-01-18: qty 13

## 2023-01-18 MED ORDER — PALONOSETRON HCL INJECTION 0.25 MG/5ML
0.2500 mg | Freq: Once | INTRAVENOUS | Status: AC
  Administered 2023-01-18: 0.25 mg via INTRAVENOUS
  Filled 2023-01-18: qty 5

## 2023-01-18 MED ORDER — ATROPINE SULFATE 1 MG/ML IV SOLN
0.5000 mg | Freq: Once | INTRAVENOUS | Status: AC | PRN
  Administered 2023-01-18: 0.5 mg via INTRAVENOUS
  Filled 2023-01-18: qty 1

## 2023-01-18 NOTE — Patient Instructions (Signed)
[facility]  Discharge Instructions: Thank you for choosing [facility] to provide your oncology and hematology care.   If you have a lab appointment with the [facility], please go directly to the [facility] and check in at the registration area.   Wear comfortable clothing and clothing appropriate for easy access to any Portacath or PICC line.   We strive to give you quality time with your provider. You may need to reschedule your appointment if you arrive late (15 or more minutes).  Arriving late affects you and other patients whose appointments are after yours.  Also, if you miss three or more appointments without notifying the office, you may be dismissed from the clinic at the provider's discretion.      For prescription refill requests, have your pharmacy contact our office and allow 72 hours for refills to be completed.    Today you received the following chemotherapy and/or immunotherapy agents: Bevacizumab, Irinotecan, and fluorouracil    To help prevent nausea and vomiting after your treatment, we encourage you to take your nausea medication as directed.  BELOW ARE SYMPTOMS THAT SHOULD BE REPORTED IMMEDIATELY: *FEVER GREATER THAN 100.4 F (38 C) OR HIGHER *CHILLS OR SWEATING *NAUSEA AND VOMITING THAT IS NOT CONTROLLED WITH YOUR NAUSEA MEDICATION *UNUSUAL SHORTNESS OF BREATH *UNUSUAL BRUISING OR BLEEDING *URINARY PROBLEMS (pain or burning when urinating, or frequent urination) *BOWEL PROBLEMS (unusual diarrhea, constipation, pain near the anus) TENDERNESS IN MOUTH AND THROAT WITH OR WITHOUT PRESENCE OF ULCERS (sore throat, sores in mouth, or a toothache) UNUSUAL RASH, SWELLING OR PAIN  UNUSUAL VAGINAL DISCHARGE OR ITCHING   Items with * indicate a potential emergency and should be followed up as soon as possible or go to the Emergency Department if any problems should occur.  Please show the CHEMOTHERAPY ALERT CARD or  IMMUNOTHERAPY ALERT CARD at check-in to the Emergency Department and triage nurse.  Should you have questions after your visit or need to cancel or reschedule your appointment, please contact [facility]  Dept: [phone]  and follow the prompts.  Office hours are 8:00 a.m. to 4:30 p.m. Monday - Friday. Please note that voicemails left after 4:00 p.m. may not be returned until the following business day.  We are closed weekends and major holidays. You have access to a nurse at all times for urgent questions. Please call the main number to the clinic Dept: [phone] and follow the prompts.   For any non-urgent questions, you may also contact your provider using MyChart. We now offer e-Visits for anyone 95 and older to request care online for non-urgent symptoms. For details visit [email or web address].   Also download the MyChart app! Go to the app store, search "MyChart", open the app, select [facility], and log in with your MyChart username and password.

## 2023-01-19 ENCOUNTER — Other Ambulatory Visit: Payer: Self-pay | Admitting: Nurse Practitioner

## 2023-01-19 DIAGNOSIS — C7800 Secondary malignant neoplasm of unspecified lung: Secondary | ICD-10-CM

## 2023-01-19 DIAGNOSIS — Z515 Encounter for palliative care: Secondary | ICD-10-CM

## 2023-01-19 DIAGNOSIS — M792 Neuralgia and neuritis, unspecified: Secondary | ICD-10-CM

## 2023-01-19 DIAGNOSIS — G893 Neoplasm related pain (acute) (chronic): Secondary | ICD-10-CM

## 2023-01-19 MED ORDER — OXYCODONE HCL 5 MG PO TABS: 5.0000 mg | ORAL_TABLET | Freq: Three times a day (TID) | ORAL | 0 refills | Status: DC | PRN

## 2023-01-19 NOTE — Telephone Encounter (Signed)
From: [name] To: Jobe Gibbon, NP Sent: 01/18/2023 9:34 PM EDT Subject: Medication Renewal Request  Refills have been requested for the following medications:   oxyCODONE (OXY IR/ROXICODONE) 5 MG immediate release tablet Jorge [name]]  Preferred pharmacy: [id number] [address], [place] Delivery method: [employer]

## 2023-01-20 ENCOUNTER — Inpatient Hospital Stay: Payer: Medicare Other

## 2023-01-20 VITALS — BP 165/92 | HR 83 | Temp 97.2°F | Resp 16

## 2023-01-20 DIAGNOSIS — Z5112 Encounter for antineoplastic immunotherapy: Secondary | ICD-10-CM | POA: Diagnosis not present

## 2023-01-20 DIAGNOSIS — C20 Malignant neoplasm of rectum: Secondary | ICD-10-CM

## 2023-01-20 MED ORDER — SODIUM CHLORIDE 0.9% FLUSH
10.0000 mL | INTRAVENOUS | Status: DC | PRN
  Administered 2023-01-20: 10 mL

## 2023-01-20 MED ORDER — HEPARIN SOD (PORK) LOCK FLUSH 100 UNIT/ML IV SOLN
500.0000 [IU] | Freq: Once | INTRAVENOUS | Status: AC | PRN
  Administered 2023-01-20: 500 [IU]

## 2023-01-20 MED ORDER — PEGFILGRASTIM-JMDB 6 MG/0.6ML ~~LOC~~ SOSY
6.0000 mg | PREFILLED_SYRINGE | Freq: Once | SUBCUTANEOUS | Status: AC
  Administered 2023-01-20: 6 mg via SUBCUTANEOUS

## 2023-01-22 ENCOUNTER — Other Ambulatory Visit: Payer: Self-pay

## 2023-01-22 DIAGNOSIS — Z515 Encounter for palliative care: Secondary | ICD-10-CM

## 2023-01-22 DIAGNOSIS — C20 Malignant neoplasm of rectum: Secondary | ICD-10-CM

## 2023-01-22 DIAGNOSIS — R197 Diarrhea, unspecified: Secondary | ICD-10-CM

## 2023-01-22 MED ORDER — DIPHENOXYLATE-ATROPINE 2.5-0.025 MG PO TABS: 1.0000 | ORAL_TABLET | Freq: Four times a day (QID) | ORAL | 1 refills | Status: DC | PRN

## 2023-02-01 ENCOUNTER — Other Ambulatory Visit: Payer: Self-pay | Admitting: Nurse Practitioner

## 2023-02-01 ENCOUNTER — Ambulatory Visit: Payer: Medicare Other

## 2023-02-01 ENCOUNTER — Other Ambulatory Visit: Payer: Medicare Other

## 2023-02-01 ENCOUNTER — Ambulatory Visit: Payer: Medicare Other | Admitting: Hematology

## 2023-02-01 DIAGNOSIS — M792 Neuralgia and neuritis, unspecified: Secondary | ICD-10-CM

## 2023-02-01 DIAGNOSIS — G893 Neoplasm related pain (acute) (chronic): Secondary | ICD-10-CM

## 2023-02-01 DIAGNOSIS — C7800 Secondary malignant neoplasm of unspecified lung: Secondary | ICD-10-CM

## 2023-02-01 DIAGNOSIS — Z515 Encounter for palliative care: Secondary | ICD-10-CM

## 2023-02-01 MED ORDER — OXYCODONE HCL 5 MG PO TABS: 5.0000 mg | ORAL_TABLET | Freq: Three times a day (TID) | ORAL | 0 refills | Status: DC | PRN

## 2023-02-01 NOTE — Telephone Encounter (Signed)
From: [name] To: Jobe Gibbon, NP Sent: 02/01/2023 8:50 AM EDT Subject: Medication Renewal Request  Refills have been requested for the following medications:   oxyCODONE (OXY IR/ROXICODONE) 5 MG immediate release tablet Chesley Noon Pickenpack-Cousar]  Preferred pharmacy: [id number] [address], [place] - [place] Delivery method: [employer]

## 2023-02-07 MED FILL — Dexamethasone Sodium Phosphate Inj 100 MG/10ML: INTRAMUSCULAR | Qty: 1 | Status: AC

## 2023-02-07 NOTE — Assessment & Plan Note (Signed)
-  [id number] with lung metastasis, MMR normal, KRAS(+) [id number]  -diagnosed 08/2021 by colonoscopy for rectal bleeding and frequent BM. Staging MRI showed early extra mesorectal lymph node involvement.  -baseline CEA 571.84 on 09/06/21.  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 Xeloda dose reduced due to elevated liver enzymes.  -due to new liver lesions, we switched to second-line FOLFIRI, with continued beva, on 02/21/22. He tolerates well overall.  - his recent restaging CT scan from 11/21/2022 showed stable lung and liver metastasis, no new lesions.   -Will continue current chemotherapy.  We previously discussed maintenance irinotecan alone, since he is tolerating FOLFIRI well, will continue for now. -due to his moderate diarrhea, I slightly decreased irinotecan dose  -plan to repeat CT after this cycle chemo

## 2023-02-07 NOTE — Progress Notes (Unsigned)
[facility]  Telephone:(336) [phone] Fax:(336) [phone]   Name: Jorge [name] Date: 02/07/2023 MRN: [id number]  DOB: 07-24-1951  Patient Care Team: Pcp, No as PCP - General Pickenpack-Cousar, [name], NP as Nurse Practitioner (Nurse Practitioner) [name], MD as Consulting Physician (Hematology)   INTERVAL HISTORY: Jorge [name] is a 72 y.o. 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  Palliative ask to see for symptom management and goals of care.   SOCIAL HISTORY:     reports that he has been smoking cigarettes. He has a 5.00 pack-year smoking history. He has never used smokeless tobacco. He reports that he does not currently use alcohol. He reports that he does not use drugs.  ADVANCE DIRECTIVES:    CODE STATUS:   PAST MEDICAL HISTORY: 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 ([place]) 08/19/2021    ALLERGIES:  has No Known Allergies.  MEDICATIONS:  Current Outpatient Medications  Medication Sig Dispense Refill   amLODipine (NORVASC) 10 MG tablet Take 1 tablet (10 mg total) by mouth daily. 30 tablet 1   carbonyl iron (FEOSOL) 45 MG TABS tablet Take 45 mg by mouth daily.     diphenoxylate-atropine (LOMOTIL) 2.5-0.025 MG tablet Take 1-2 tablets by mouth 4 (four) times daily as needed for diarrhea or loose stools. 90 tablet 1   DULoxetine (CYMBALTA) 20 MG capsule Take 1 capsule (20 mg total) by mouth daily. 30 capsule 1   gabapentin (NEURONTIN) 100 MG capsule Take 2 capsules (200 mg total) by mouth 3 (three) times daily. 180 capsule 1   Multiple Vitamin (MULTIVITAMIN WITH MINERALS) TABS tablet Take 1 tablet by mouth daily.     ondansetron (ZOFRAN) 8 MG tablet Take 1 tablet (8 mg total) by mouth every 8 (eight) hours as needed for nausea or vomiting. 30 tablet 3   oxyCODONE (OXY IR/ROXICODONE) 5 MG  immediate release tablet Take 1 tablet (5 mg total) by mouth every 8 (eight) hours as needed for severe pain or breakthrough pain. 45 tablet 0   No current facility-administered medications for this visit.    VITAL SIGNS: There were no vitals taken for this visit. There were no vitals filed for this visit.   Estimated body mass index is 19.5 kg/m as calculated from the following:   Height as of 01/18/23: 6' (1.829 m).   Weight as of 01/18/23: 143 lb 12.8 oz (65.2 kg).   PERFORMANCE STATUS (ECOG) : 1 - Symptomatic but completely ambulatory  Assessment NAD, ambulatory RRR Normal breathing pattern  AAO x3, mood appropriate    IMPRESSION: I saw [name] during his infusion. No acute distress. Is remaining as active as possible. Denies nausea, vomiting, constipation. Occasional diarrhea which is controlled.   Neuropathic Pain  [name]'s pain continues to be well controlled on current regimen. Some days are better than others pending level of activity and around treatment days. Much improvement in his neuropathic symptoms allowing him to walk further distance and longer. He is tolerating cymbalta and gabapentin. Oxy IR as needed for severe pain/discomfort.   No changes to current regimen.   Will continue to closely monitor and support as needed.   PLAN:  Gabapentin 300mg  three times daily.   Cymbalta 20mg  daily. Tolerating well.  Oxy IR 5mg  every 8 hours as needed. Not requiring around the clock.  I will plan  to see patient back in 3-4 weeks in collaboration with his oncology appointments.   Patient expressed understanding and was in agreement with this plan. He also understands that He can call the clinic at any time with any questions, concerns, or complaints.     Any controlled substances utilized were prescribed in the context of palliative care. PDMP has been reviewed.    Time Total: 20 min   Visit consisted of counseling and education dealing with the complex and  emotionally intense issues of symptom management and palliative care in the setting of serious and potentially life-threatening illness.Greater than 50%  of this time was spent counseling and coordinating care related to the above assessment and plan.  [name], AGPCNP-BC  Palliative Medicine Team/[facility]

## 2023-02-07 NOTE — Assessment & Plan Note (Signed)
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 He is on gabapentin and cymbalta, taking 2-3 oxycodone a day. He is followed by NP Nikki.    

## 2023-02-07 NOTE — Progress Notes (Unsigned)
[place]   Telephone:([phone]) [phone] Fax:(336) [phone]   Clinic Follow up Note   Patient Care Team: Pcp, No as PCP - General Pickenpack-Cousar, [name], NP as Nurse Practitioner (Nurse Practitioner) [name], MD as Consulting Physician (Hematology)  Date of Service:  02/08/2023  CHIEF COMPLAINT: f/u of metastatic rectal cancer     CURRENT THERAPY:  Second-line FOLFIRI, q2weeks, started 02/22/22 -Bevacizumab started 11/24/21   ASSESSMENT:  Jorge [name] is a 72 y.o. male with   Rectal cancer ([place]) [id number] with lung metastasis, MMR normal, KRAS(+) [id number]  -diagnosed 08/2021 by colonoscopy for rectal bleeding and frequent BM. Staging MRI showed early extra mesorectal lymph node involvement.  -baseline CEA 571.84 on 09/06/21.  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 Xeloda dose reduced due to elevated liver enzymes.  -due to new liver lesions, we switched to second-line FOLFIRI, with continued beva, on 02/21/22. He tolerates well overall.  - his recent restaging CT scan from 11/21/2022 showed stable lung and liver metastasis, no new lesions.   -Will continue current chemotherapy.  We previously discussed maintenance irinotecan alone, since he is tolerating FOLFIRI well, will continue for now. 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 He is on gabapentin and cymbalta, taking 2-3 oxycodone a day. He is followed by NP Lexine Baton.     PLAN: -lab reviewed -order CT scan to be done in 3-4 weeks -proceed with C15 FOLFIRI+BEVA at same reduce dose of Irinotecan at 140mg /m2  -lab/flush and FOLFIRI 4/18   SUMMARY OF ONCOLOGIC HISTORY: Oncology History Overview Note  Cancer  Staging Rectal cancer [facility]) Staging form: Colon and Rectum, AJCC 8th Edition - Clinical stage from 08/29/2021: [name], cM1 - Signed by [name], MD on 08/29/2021    Rectal cancer  07/23/2021 Imaging   CT AP  IMPRESSION: 1. Appearance of the rectum likely indicates a rectal mass suspicious for rectal carcinoma. Inflammatory process would be less likely. Direct visualization is suggested. 2. Cholelithiasis without evidence of acute cholecystitis. 3. Aortic atherosclerosis.   08/19/2021 Procedure   Colonoscopy, under Dr. [name]  Impression: - Rectal tenderness, palpable rectal mass and hemorrhoids found on digital rectal exam. - Stool in the entire examined colon - lavaged with still inadequate clearance. - One 35 mm polyp at the hepatic flexure. Biopsied. Tattooed distal in case future endoscopic resection is considered - however, has larger issues with rectal mass currently as below. - Four, 5 to 11 mm polyps in the transverse colon and at the hepatic flexure, removed with a cold snare. Resected and retrieved. - Diverticulosis in the recto-sigmoid colon and in the sigmoid colon. - Rule out malignancy, partially obstructing tumor in the rectum and from 6 to 13 cm proximal to the anus. Biopsied.   08/19/2021 Pathology Results   Diagnosis 1. Hepatic Flexure Biopsy - TUBULAR ADENOMA WITHOUT HIGH-GRADE DYSPLASIA OR MALIGNANCY 2. Transverse Colon Biopsy, and hepatic flexure, polyps (4) - TUBULAR ADENOMA WITHOUT HIGH-GRADE DYSPLASIA OR MALIGNANCY - OTHER FRAGMENTS OF POLYPOID COLONIC MUCOSA WITH NO SPECIFIC HISTOPATHOLOGIC CHANGES - FOOD MATERIAL 3. Rectum, biopsy - ADENOCARCINOMA. SEE NOTE   08/22/2021 Initial Diagnosis   Rectal cancer ([place])   08/26/2021 Imaging   IMPRESSION: 1. A 2.2 x 1.9 cm right middle lobe pulmonary  nodule as well as a total of four right upper lobe pulmonary nodule and masses measuring up to 3.6 cm. Findings concerning for metastatic primary lung  cancer versus less likely metastases in a patient with rectal cancer. Additional imaging evaluation or consultation with Pulmonology or Thoracic Surgery recommended. 2. No gross hilar adenopathy, noting limited sensitivity for the detection of hilar adenopathy on this noncontrast study. 3. Cholelithiasis. 4.  Emphysema (ICD10-J43.9). 5. At least left anterior descending coronary artery calcifications.   08/27/2021 Imaging   IMPRESSION: Rectal adenocarcinoma T stage: T4 B   Rectal adenocarcinoma N stage: N2 disease likely associated with early extra mesorectal lymph node involvement.   Distance from tumor to the internal anal sphincter is 1.2 cm.   Also with extramural venous involvement as described.   08/29/2021 Cancer Staging   Staging form: Colon and Rectum, AJCC 8th Edition - Clinical stage from 08/29/2021: [name], cM1 - Signed by [name], MD on 08/29/2021 Stage prefix: Initial diagnosis   01/23/2022 Imaging   CT CAP w contrast IMPRESSION: 1. Mild interval decrease in size of multiple right-sided pulmonary nodules. No new suspicious pulmonary nodule or mass. 2. Interval development of approximately 10 new ill-defined hypoattenuating liver lesions ranging in size from approximately 5-10 mm. Imaging features suspicious for metastatic disease. MRI abdomen with and without contrast may prove helpful to further evaluate. 3. Similar appearance of wall thickening in the rectum with perirectal edema. 4. Cholelithiasis. 5. Prostatomegaly. 6. Aortic Atherosclerosis (ICD10-I70.0).   11/21/2022 Imaging    IMPRESSION: 1. Stable exam. No new or progressive interval findings. 2. The primary rectosigmoid lesion described previously is not well seen on the current study. There is presacral and perirectal edema, likely treatment related. 3. No substantial change in appearance of bilateral pulmonary metastases. 4. Stable tiny hypodensities in both hepatic lobes. These were previously  characterized as metastatic disease. No new liver lesions on today's exam. 5. Cholelithiasis. 6. 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 He was last seen by me on 01/18/2023 He presents to the clinic alone. Pt stated that he is doing good. Pt state since the reduction of the chemo he noticed a difference and he had no diarrhea.    All other systems were reviewed with the patient and are negative.  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 08/19/2021    SURGICAL HISTORY: Past Surgical History:  Procedure Laterality Date   BRONCHIAL BIOPSY  09/27/2021   Procedure: BRONCHIAL BIOPSIES;  Surgeon: Garner Nash, DO;  Location: [facility];  Service: Pulmonary;;   BRONCHIAL BRUSHINGS  09/27/2021   Procedure: BRONCHIAL BRUSHINGS;  Surgeon: Garner Nash, DO;  Location: [facility];  Service: Pulmonary;;   BRONCHIAL NEEDLE ASPIRATION BIOPSY  09/27/2021   Procedure: BRONCHIAL NEEDLE ASPIRATION BIOPSIES;  Surgeon: Garner Nash, DO;  Location: [facility];  Service: Pulmonary;;   COLONOSCOPY     IR IMAGING GUIDED PORT INSERTION  10/24/2021   left breast surgery Left 1968   VIDEO BRONCHOSCOPY WITH RADIAL ENDOBRONCHIAL ULTRASOUND  09/27/2021   Procedure: RADIAL ENDOBRONCHIAL ULTRASOUND;  Surgeon: Garner Nash, DO;  Location: [facility]  ENDOSCOPY;  Service: Pulmonary;;    I have reviewed the social history and family history with the patient and they are unchanged from previous note.  ALLERGIES:  has No Known Allergies.  MEDICATIONS:  Current Outpatient Medications  Medication Sig Dispense Refill   amLODipine (NORVASC) 10 MG tablet Take 1 tablet (10 mg total) by mouth daily. 30 tablet 1   carbonyl iron (FEOSOL) 45 MG TABS tablet Take 45 mg by mouth daily.     diphenoxylate-atropine (LOMOTIL) 2.5-0.025 MG tablet Take 1-2 tablets by mouth 4 (four) times daily as needed for diarrhea or loose stools. 90 tablet 1   DULoxetine (CYMBALTA) 20 MG capsule Take 1 capsule (20 mg total) by mouth daily. 30 capsule 1   gabapentin (NEURONTIN) 100 MG capsule Take 2 capsules (200 mg total) by mouth 3 (three) times daily. 180 capsule 1   Multiple Vitamin (MULTIVITAMIN WITH MINERALS) TABS tablet Take 1 tablet by mouth daily.     ondansetron (ZOFRAN) 8 MG tablet Take 1 tablet (8 mg total) by mouth every 8 (eight) hours as needed for nausea or vomiting. 30 tablet 3   oxyCODONE (OXY IR/ROXICODONE) 5 MG immediate release tablet Take 1 tablet (5 mg total) by mouth every 8 (eight) hours as needed for severe pain or breakthrough pain. 45 tablet 0   No current facility-administered medications for this visit.    PHYSICAL EXAMINATION: ECOG PERFORMANCE STATUS: 1 - Symptomatic but completely ambulatory  Vitals:   02/08/23 0931  BP: (!) 154/79  Pulse: 73  Resp: 18  Temp: 98.3 F (36.8 C)  SpO2: 99%   Wt Readings from Last 3 Encounters:  02/08/23 143 lb 11.2 oz (65.2 kg)  01/18/23 143 lb 12.8 oz (65.2 kg)  01/04/23 140 lb 14.4 oz (63.9 kg)     GENERAL:alert, no distress and comfortable SKIN: skin color normal, no rashes or significant lesions EYES: normal, Conjunctiva are pink and non-injected, sclera clear  NEURO: alert & oriented x 3 with fluent speech   LABORATORY DATA:  I have reviewed the data as listed    Latest Ref Rng &  Units 02/08/2023    8:33 AM 01/18/2023    7:58 AM 01/04/2023    8:49 AM  CBC  WBC 4.0 - 10.5 K/uL 3.6  5.7  5.9   Hemoglobin 13.0 - 17.0 g/dL 13.3  13.3  13.8   Hematocrit 39.0 - 52.0 % 41.8  41.4  41.6   Platelets 150 - 400 K/uL 176  199  162         Latest Ref Rng & Units 02/08/2023    8:33 AM 01/18/2023    7:58 AM 01/04/2023    8:49 AM  CMP  Glucose 70 - 99 mg/dL 136  104  137   BUN 8 - 23 mg/dL 12  11  9    Creatinine 0.61 - 1.24 mg/dL 0.81  0.87  0.79   Sodium 135 - 145 mmol/L 137  140  136   Potassium 3.5 - 5.1 mmol/L 3.7  4.2  4.1   Chloride 98 - 111 mmol/L 104  105  102   CO2 22 - 32 mmol/L 29  31  29  Calcium 8.9 - 10.3 mg/dL 9.2  8.8  8.5   Total Protein 6.5 - 8.1 g/dL 6.9  6.9  6.4   Total Bilirubin 0.3 - 1.2 mg/dL 0.6  0.3  0.3   Alkaline Phos 38 - 126 U/L 118  173  161   AST 15 - 41 U/L 22  18  20    ALT 0 - 44 U/L 16  12  15        RADIOGRAPHIC STUDIES: I have personally reviewed the radiological images as listed and agreed with the findings in the report. No results found.    Orders Placed This Encounter  Procedures   CT CHEST ABDOMEN PELVIS W CONTRAST    Standing Status:   Future    Standing Expiration Date:   02/08/2024    Order Specific Question:   If indicated for the ordered procedure, I authorize the administration of contrast media per Radiology protocol    Answer:   Yes    Order Specific Question:   Does the patient have a contrast media/X-ray dye allergy?    Answer:   No    Order Specific Question:   Preferred imaging location?    Answer:   [facility]    Order Specific Question:   Release to patient    Answer:   Immediate    Order Specific Question:   Is Oral Contrast requested for this exam?    Answer:   Yes, Per Radiology protocol   CBC with Differential ([place] Only)    Standing Status:   Future    Standing Expiration Date:   03/21/2024   CMP ([place] only)    Standing Status:   Future    Standing Expiration Date:    03/21/2024   Total Protein, Urine dipstick    Standing Status:   Future    Standing Expiration Date:   03/21/2024   All questions were answered. The patient knows to call the clinic with any problems, questions or concerns. No barriers to learning was detected. The total time spent in the appointment was 25 minutes.     [name], MD 02/08/2023   Felicity Coyer, CMA, am acting as scribe for [name], MD.   I have reviewed the above documentation for accuracy and completeness, and I agree with the above.

## 2023-02-08 ENCOUNTER — Inpatient Hospital Stay (HOSPITAL_BASED_OUTPATIENT_CLINIC_OR_DEPARTMENT_OTHER): Payer: Medicare Other | Admitting: Nurse Practitioner

## 2023-02-08 ENCOUNTER — Inpatient Hospital Stay: Payer: Medicare Other | Attending: Physician Assistant

## 2023-02-08 ENCOUNTER — Other Ambulatory Visit: Payer: Self-pay

## 2023-02-08 ENCOUNTER — Inpatient Hospital Stay: Payer: Medicare Other

## 2023-02-08 ENCOUNTER — Encounter: Payer: Self-pay | Admitting: Nurse Practitioner

## 2023-02-08 ENCOUNTER — Inpatient Hospital Stay (HOSPITAL_BASED_OUTPATIENT_CLINIC_OR_DEPARTMENT_OTHER): Payer: Medicare Other | Admitting: Hematology

## 2023-02-08 ENCOUNTER — Encounter: Payer: Self-pay | Admitting: Hematology

## 2023-02-08 VITALS — BP 154/79 | HR 73 | Temp 98.3°F | Resp 18 | Ht 72.0 in | Wt 143.7 lb

## 2023-02-08 VITALS — BP 139/77 | HR 77 | Resp 18

## 2023-02-08 DIAGNOSIS — R530 Neoplastic (malignant) related fatigue: Secondary | ICD-10-CM | POA: Diagnosis not present

## 2023-02-08 DIAGNOSIS — M792 Neuralgia and neuritis, unspecified: Secondary | ICD-10-CM

## 2023-02-08 DIAGNOSIS — C775 Secondary and unspecified malignant neoplasm of intrapelvic lymph nodes: Secondary | ICD-10-CM | POA: Insufficient documentation

## 2023-02-08 DIAGNOSIS — Z5189 Encounter for other specified aftercare: Secondary | ICD-10-CM | POA: Insufficient documentation

## 2023-02-08 DIAGNOSIS — C20 Malignant neoplasm of rectum: Secondary | ICD-10-CM

## 2023-02-08 DIAGNOSIS — C7801 Secondary malignant neoplasm of right lung: Secondary | ICD-10-CM | POA: Insufficient documentation

## 2023-02-08 DIAGNOSIS — C7800 Secondary malignant neoplasm of unspecified lung: Secondary | ICD-10-CM

## 2023-02-08 DIAGNOSIS — Z5112 Encounter for antineoplastic immunotherapy: Secondary | ICD-10-CM | POA: Insufficient documentation

## 2023-02-08 DIAGNOSIS — Z5111 Encounter for antineoplastic chemotherapy: Secondary | ICD-10-CM | POA: Diagnosis present

## 2023-02-08 DIAGNOSIS — Z95828 Presence of other vascular implants and grafts: Secondary | ICD-10-CM

## 2023-02-08 DIAGNOSIS — C787 Secondary malignant neoplasm of liver and intrahepatic bile duct: Secondary | ICD-10-CM | POA: Diagnosis not present

## 2023-02-08 DIAGNOSIS — T451X5A Adverse effect of antineoplastic and immunosuppressive drugs, initial encounter: Secondary | ICD-10-CM

## 2023-02-08 DIAGNOSIS — G620 Drug-induced polyneuropathy: Secondary | ICD-10-CM

## 2023-02-08 DIAGNOSIS — Z515 Encounter for palliative care: Secondary | ICD-10-CM

## 2023-02-08 DIAGNOSIS — G893 Neoplasm related pain (acute) (chronic): Secondary | ICD-10-CM

## 2023-02-08 DIAGNOSIS — R630 Anorexia: Secondary | ICD-10-CM

## 2023-02-08 LAB — CBC WITH DIFFERENTIAL (CANCER CENTER ONLY)
Abs Immature Granulocytes: 0 10*3/uL (ref 0.00–0.07)
Basophils Absolute: 0.1 10*3/uL (ref 0.0–0.1)
Basophils Relative: 2 %
Eosinophils Absolute: 0.2 10*3/uL (ref 0.0–0.5)
Eosinophils Relative: 6 %
HCT: 41.8 % (ref 39.0–52.0)
Hemoglobin: 13.3 g/dL (ref 13.0–17.0)
Immature Granulocytes: 0 %
Lymphocytes Relative: 27 %
Lymphs Abs: 1 10*3/uL (ref 0.7–4.0)
MCH: 27.9 pg (ref 26.0–34.0)
MCHC: 31.8 g/dL (ref 30.0–36.0)
MCV: 87.8 fL (ref 80.0–100.0)
Monocytes Absolute: 0.8 10*3/uL (ref 0.1–1.0)
Monocytes Relative: 22 %
Neutro Abs: 1.6 10*3/uL — ABNORMAL LOW (ref 1.7–7.7)
Neutrophils Relative %: 43 %
Platelet Count: 176 10*3/uL (ref 150–400)
RBC: 4.76 MIL/uL (ref 4.22–5.81)
RDW: 17.2 % — ABNORMAL HIGH (ref 11.5–15.5)
WBC Count: 3.6 10*3/uL — ABNORMAL LOW (ref 4.0–10.5)
nRBC: 0 % (ref 0.0–0.2)

## 2023-02-08 LAB — CMP (CANCER CENTER ONLY)
ALT: 16 U/L (ref 0–44)
AST: 22 U/L (ref 15–41)
Albumin: 3.6 g/dL (ref 3.5–5.0)
Alkaline Phosphatase: 118 U/L (ref 38–126)
Anion gap: 4 — ABNORMAL LOW (ref 5–15)
BUN: 12 mg/dL (ref 8–23)
CO2: 29 mmol/L (ref 22–32)
Calcium: 9.2 mg/dL (ref 8.9–10.3)
Chloride: 104 mmol/L (ref 98–111)
Creatinine: 0.81 mg/dL (ref 0.61–1.24)
GFR, Estimated: >60 mL/min (ref >60)
Glucose, Bld: 136 mg/dL — ABNORMAL HIGH (ref 70–99)
Potassium: 3.7 mmol/L (ref 3.5–5.1)
Sodium: 137 mmol/L (ref 135–145)
Total Bilirubin: 0.6 mg/dL (ref 0.3–1.2)
Total Protein: 6.9 g/dL (ref 6.5–8.1)

## 2023-02-08 LAB — TOTAL PROTEIN, URINE DIPSTICK: Protein, ur: NEGATIVE mg/dL

## 2023-02-08 MED ORDER — SODIUM CHLORIDE 0.9 % IV SOLN
140.0000 mg/m2 | Freq: Once | INTRAVENOUS | Status: AC
  Administered 2023-02-08: 260 mg via INTRAVENOUS
  Filled 2023-02-08: qty 13

## 2023-02-08 MED ORDER — SODIUM CHLORIDE 0.9 % IV SOLN
10.0000 mg | Freq: Once | INTRAVENOUS | Status: AC
  Administered 2023-02-08: 10 mg via INTRAVENOUS
  Filled 2023-02-08: qty 10

## 2023-02-08 MED ORDER — ATROPINE SULFATE 1 MG/ML IV SOLN
0.5000 mg | Freq: Once | INTRAVENOUS | Status: AC | PRN
  Administered 2023-02-08: 0.5 mg via INTRAVENOUS
  Filled 2023-02-08: qty 1

## 2023-02-08 MED ORDER — SODIUM CHLORIDE 0.9 % IV SOLN: Freq: Once | INTRAVENOUS | Status: AC

## 2023-02-08 MED ORDER — PALONOSETRON HCL INJECTION 0.25 MG/5ML
0.2500 mg | Freq: Once | INTRAVENOUS | Status: AC
  Administered 2023-02-08: 0.25 mg via INTRAVENOUS
  Filled 2023-02-08: qty 5

## 2023-02-08 MED ORDER — SODIUM CHLORIDE 0.9 % IV SOLN
400.0000 mg/m2 | Freq: Once | INTRAVENOUS | Status: AC
  Administered 2023-02-08: 740 mg via INTRAVENOUS
  Filled 2023-02-08: qty 25

## 2023-02-08 MED ORDER — SODIUM CHLORIDE 0.9 % IV SOLN
5.0000 mg/kg | Freq: Once | INTRAVENOUS | Status: AC
  Administered 2023-02-08: 350 mg via INTRAVENOUS
  Filled 2023-02-08: qty 14

## 2023-02-08 MED ORDER — SODIUM CHLORIDE 0.9 % IV SOLN
2400.0000 mg/m2 | INTRAVENOUS | Status: DC
  Administered 2023-02-08: 4450 mg via INTRAVENOUS
  Filled 2023-02-08: qty 89

## 2023-02-08 MED ORDER — SODIUM CHLORIDE 0.9% FLUSH
10.0000 mL | Freq: Once | INTRAVENOUS | Status: AC
  Administered 2023-02-08: 10 mL

## 2023-02-08 NOTE — Patient Instructions (Signed)
[facility]  Discharge Instructions: Thank you for choosing [facility] to provide your oncology and hematology care.   If you have a lab appointment with the Cancer Center, please go directly to the Cancer Center and check in at the registration area.   Wear comfortable clothing and clothing appropriate for easy access to any Portacath or PICC line.   We strive to give you quality time with your provider. You may need to reschedule your appointment if you arrive late (15 or more minutes).  Arriving late affects you and other patients whose appointments are after yours.  Also, if you miss three or more appointments without notifying the office, you may be dismissed from the clinic at the provider's discretion.      For prescription refill requests, have your pharmacy contact our office and allow 72 hours for refills to be completed.    Today you received the following chemotherapy and/or immunotherapy agents: Bevacizumab, Irinotecan, and fluorouracil    To help prevent nausea and vomiting after your treatment, we encourage you to take your nausea medication as directed.  BELOW ARE SYMPTOMS THAT SHOULD BE REPORTED IMMEDIATELY: *FEVER GREATER THAN 100.4 F (38 C) OR HIGHER *CHILLS OR SWEATING *NAUSEA AND VOMITING THAT IS NOT CONTROLLED WITH YOUR NAUSEA MEDICATION *UNUSUAL SHORTNESS OF BREATH *UNUSUAL BRUISING OR BLEEDING *URINARY PROBLEMS (pain or burning when urinating, or frequent urination) *BOWEL PROBLEMS (unusual diarrhea, constipation, pain near the anus) TENDERNESS IN MOUTH AND THROAT WITH OR WITHOUT PRESENCE OF ULCERS (sore throat, sores in mouth, or a toothache) UNUSUAL RASH, SWELLING OR PAIN  UNUSUAL VAGINAL DISCHARGE OR ITCHING   Items with * indicate a potential emergency and should be followed up as soon as possible or go to the Emergency Department if any problems should occur.  Please show the CHEMOTHERAPY ALERT CARD or  IMMUNOTHERAPY ALERT CARD at check-in to the Emergency Department and triage nurse.  Should you have questions after your visit or need to cancel or reschedule your appointment, please contact [facility]  Dept: [phone]  and follow the prompts.  Office hours are 8:00 a.m. to 4:30 p.m. Monday - Friday. Please note that voicemails left after 4:00 p.m. may not be returned until the following business day.  We are closed weekends and major holidays. You have access to a nurse at all times for urgent questions. Please call the main number to the clinic Dept: [phone] and follow the prompts.   For any non-urgent questions, you may also contact your provider using MyChart. We now offer e-Visits for anyone 18 and older to request care online for non-urgent symptoms. For details visit [email or web address].   Also download the MyChart app! Go to the app store, search "MyChart", open the app, select [facility], and log in with your MyChart username and password.   

## 2023-02-10 ENCOUNTER — Inpatient Hospital Stay: Payer: Medicare Other

## 2023-02-10 VITALS — BP 156/84 | HR 58 | Temp 97.9°F | Resp 18

## 2023-02-10 DIAGNOSIS — Z5112 Encounter for antineoplastic immunotherapy: Secondary | ICD-10-CM | POA: Diagnosis not present

## 2023-02-10 DIAGNOSIS — C20 Malignant neoplasm of rectum: Secondary | ICD-10-CM

## 2023-02-10 MED ORDER — PEGFILGRASTIM-JMDB 6 MG/0.6ML ~~LOC~~ SOSY
6.0000 mg | PREFILLED_SYRINGE | Freq: Once | SUBCUTANEOUS | Status: AC
  Administered 2023-02-10: 6 mg via SUBCUTANEOUS

## 2023-02-10 MED ORDER — HEPARIN SOD (PORK) LOCK FLUSH 100 UNIT/ML IV SOLN
500.0000 [IU] | Freq: Once | INTRAVENOUS | Status: AC | PRN
  Administered 2023-02-10: 500 [IU]

## 2023-02-10 MED ORDER — SODIUM CHLORIDE 0.9% FLUSH
10.0000 mL | INTRAVENOUS | Status: DC | PRN
  Administered 2023-02-10: 10 mL

## 2023-02-14 ENCOUNTER — Other Ambulatory Visit: Payer: Self-pay | Admitting: Nurse Practitioner

## 2023-02-14 DIAGNOSIS — G893 Neoplasm related pain (acute) (chronic): Secondary | ICD-10-CM

## 2023-02-14 DIAGNOSIS — Z515 Encounter for palliative care: Secondary | ICD-10-CM

## 2023-02-14 DIAGNOSIS — M792 Neuralgia and neuritis, unspecified: Secondary | ICD-10-CM

## 2023-02-14 DIAGNOSIS — C7800 Secondary malignant neoplasm of unspecified lung: Secondary | ICD-10-CM

## 2023-02-14 MED ORDER — OXYCODONE HCL 5 MG PO TABS
5.0000 mg | ORAL_TABLET | Freq: Three times a day (TID) | ORAL | 0 refills | Status: DC | PRN
Start: 2023-02-16 — End: 2023-02-28

## 2023-02-14 NOTE — Telephone Encounter (Signed)
From: Clearence Cheek To: Jorge Reel, NP Sent: 02/14/2023 8:25 AM EDT Subject: Medication Renewal Request  Refills have been requested for the following medications:   oxyCODONE (OXY IR/ROXICODONE) 5 MG immediate release tablet Jorge [name]]  Preferred pharmacy: [id number] [place], [place] - [address] Delivery method: [employer]

## 2023-02-21 MED FILL — Dexamethasone Sodium Phosphate Inj 100 MG/10ML: INTRAMUSCULAR | Qty: 1 | Status: AC

## 2023-02-21 NOTE — Assessment & Plan Note (Signed)
-  [id number] with lung metastasis, MMR normal, KRAS(+) [id number]  -diagnosed 08/2021 by colonoscopy for rectal bleeding and frequent BM. Staging MRI showed early extra mesorectal lymph node involvement.  -baseline CEA 571.84 on 09/06/21.  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 Xeloda dose reduced due to elevated liver enzymes.  -due to new liver lesions, we switched to second-line FOLFIRI, with continued beva, on 02/21/22. He tolerates well overall.  - his recent restaging CT scan from 11/21/2022 showed stable lung and liver metastasis, no new lesions.   -Will continue current chemotherapy.  We previously discussed maintenance irinotecan alone, since he is tolerating FOLFIRI well, will continue for now. -He is scheduled for restaging CT scan on April 22

## 2023-02-21 NOTE — Assessment & Plan Note (Signed)
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 He is on gabapentin and cymbalta, taking 2-3 oxycodone a day. He is followed by NP Nikki.    

## 2023-02-21 NOTE — Progress Notes (Unsigned)
[facility]   Telephone:(336) [phone] Fax:(336) [phone]   Clinic Follow up Note   Patient Care Team: Pcp, No as PCP - General Pickenpack-Cousar, Arty Baumgartner, NP as Nurse Practitioner (Nurse Practitioner) Malachy Mood, MD as Consulting Physician (Hematology)  Date of Service:  02/22/2023  CHIEF COMPLAINT: f/u of  metastatic rectal cancer   CURRENT THERAPY:  Second-line FOLFIRI, q2weeks, started 02/22/22 -Bevacizumab started 11/24/21   ASSESSMENT:  Jorge [name] is a 72 y.o. 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 Staging MRI showed early extra mesorectal lymph node involvement.  -baseline CEA 571.84 on 09/06/21.  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 Xeloda dose reduced due to elevated liver enzymes.  -due to new liver lesions, we switched to second-line FOLFIRI, with continued beva, on 02/21/22. He tolerates well overall.  - his recent restaging CT scan from 11/21/2022 showed stable lung and liver metastasis, no new lesions.   -Will continue current chemotherapy.  We previously discussed maintenance irinotecan alone, since he is tolerating FOLFIRI well, will continue for now. 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 He is on gabapentin and cymbalta, taking 2-3 oxycodone a day. He is followed by NP Lowella Bandy.      PLAN: -lab reviewed- WBC 3.1 -I refill Limotil - CT scan schedule for 4/22 -proceed with C16 FOLFIRI+BEVA  at same dose -lab/flush and f/u and 03/08/2023  SUMMARY OF ONCOLOGIC HISTORY: Oncology History Overview Note  Cancer Staging Rectal cancer [facility]) Staging form: Colon and Rectum, AJCC 8th  Edition - Clinical stage from 08/29/2021: [name], cM1 - Signed by Malachy Mood, MD on 08/29/2021    Rectal cancer  07/23/2021 Imaging   CT AP  IMPRESSION: 1. Appearance of the rectum likely indicates a rectal mass suspicious for rectal carcinoma. Inflammatory process would be less likely. Direct visualization is suggested. 2. Cholelithiasis without evidence of acute cholecystitis. 3. Aortic atherosclerosis.   08/19/2021 Procedure   Colonoscopy, under Dr. Meridee Score  Impression: - Rectal tenderness, palpable rectal mass and hemorrhoids found on digital rectal exam. - Stool in the entire examined colon - lavaged with still inadequate clearance. - One 35 mm polyp at the hepatic flexure. Biopsied. Tattooed distal in case future endoscopic resection is considered - however, has larger issues with rectal mass currently as below. - Four, 5 to 11 mm polyps in the transverse colon and at the hepatic flexure, removed with a cold snare. Resected and retrieved. - Diverticulosis in the recto-sigmoid colon and in the sigmoid colon. - Rule out malignancy, partially obstructing tumor in the rectum and from 6 to 13 cm proximal to the anus. Biopsied.   08/19/2021 Pathology Results   Diagnosis 1. Hepatic Flexure Biopsy - TUBULAR ADENOMA WITHOUT HIGH-GRADE DYSPLASIA OR MALIGNANCY 2. Transverse Colon Biopsy, and hepatic flexure, polyps (4) - TUBULAR ADENOMA WITHOUT HIGH-GRADE DYSPLASIA OR MALIGNANCY - OTHER FRAGMENTS OF POLYPOID COLONIC MUCOSA WITH NO SPECIFIC HISTOPATHOLOGIC CHANGES - FOOD MATERIAL 3. Rectum, biopsy - ADENOCARCINOMA. SEE NOTE   08/22/2021 Initial Diagnosis   Rectal cancer (HCC)   08/26/2021 Imaging   IMPRESSION: 1. A 2.2 x 1.9 cm right middle lobe pulmonary nodule as well as a total of four right upper lobe pulmonary nodule  and masses measuring up to 3.6 cm. Findings concerning for metastatic primary lung cancer versus less likely metastases in a patient with rectal cancer.  Additional imaging evaluation or consultation with Pulmonology or Thoracic Surgery recommended. 2. No gross hilar adenopathy, noting limited sensitivity for the detection of hilar adenopathy on this noncontrast study. 3. Cholelithiasis. 4.  Emphysema (ICD10-J43.9). 5. At least left anterior descending coronary artery calcifications.   08/27/2021 Imaging   IMPRESSION: Rectal adenocarcinoma T stage: T4 B   Rectal adenocarcinoma N stage: N2 disease likely associated with early extra mesorectal lymph node involvement.   Distance from tumor to the internal anal sphincter is 1.2 cm.   Also with extramural venous involvement as described.   08/29/2021 Cancer Staging   Staging form: Colon and Rectum, AJCC 8th Edition - Clinical stage from 08/29/2021: [name], cM1 - Signed by Malachy Mood, MD on 08/29/2021 Stage prefix: Initial diagnosis   01/23/2022 Imaging   CT CAP w contrast IMPRESSION: 1. Mild interval decrease in size of multiple right-sided pulmonary nodules. No new suspicious pulmonary nodule or mass. 2. Interval development of approximately 10 new ill-defined hypoattenuating liver lesions ranging in size from approximately 5-10 mm. Imaging features suspicious for metastatic disease. MRI abdomen with and without contrast may prove helpful to further evaluate. 3. Similar appearance of wall thickening in the rectum with perirectal edema. 4. Cholelithiasis. 5. Prostatomegaly. 6. Aortic Atherosclerosis (ICD10-I70.0).   11/21/2022 Imaging    IMPRESSION: 1. Stable exam. No new or progressive interval findings. 2. The primary rectosigmoid lesion described previously is not well seen on the current study. There is presacral and perirectal edema, likely treatment related. 3. No substantial change in appearance of bilateral pulmonary metastases. 4. Stable tiny hypodensities in both hepatic lobes. These were previously characterized as metastatic disease. No new liver lesions on today's  exam. 5. Cholelithiasis. 6. 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 He was last seen by me on 02/08/2023. He presents to the clinic alone. Pt reports of doing well. Pt stated that he has diarrhea and he used Imodium to relieve the symptoms. Pt state that he had some dairy products late last night and it triggered the Diarrhea episode.     they're feeling, complaints and concerns, etc}   All other systems were reviewed with the patient and are negative.  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 08/19/2021    SURGICAL HISTORY: Past Surgical History:  Procedure Laterality Date   BRONCHIAL BIOPSY  09/27/2021   Procedure: BRONCHIAL BIOPSIES;  Surgeon: Josephine Igo, DO;  Location: [facility];  Service: Pulmonary;;   BRONCHIAL BRUSHINGS  09/27/2021   Procedure: BRONCHIAL BRUSHINGS;  Surgeon: Josephine Igo, DO;  Location: [facility];  Service: Pulmonary;;   BRONCHIAL NEEDLE ASPIRATION BIOPSY  09/27/2021   Procedure: BRONCHIAL  NEEDLE ASPIRATION BIOPSIES;  Surgeon: Josephine Igo, DO;  Location: [facility];  Service: Pulmonary;;   COLONOSCOPY     IR IMAGING GUIDED PORT INSERTION  10/24/2021   left breast surgery Left 1968   VIDEO BRONCHOSCOPY WITH RADIAL ENDOBRONCHIAL ULTRASOUND  09/27/2021   Procedure: RADIAL ENDOBRONCHIAL  ULTRASOUND;  Surgeon: Josephine Igo, DO;  Location: [facility];  Service: Pulmonary;;    I have reviewed the social history and family history with the patient and they are unchanged from previous note.  ALLERGIES:  has No Known Allergies.  MEDICATIONS:  Current Outpatient Medications  Medication Sig Dispense Refill   amLODipine (NORVASC) 10 MG tablet Take 1 tablet (10 mg total) by mouth daily. 30 tablet 1   amLODipine (NORVASC) 5 MG tablet TAKE 1 TABLET (5 MG TOTAL) BY MOUTH DAILY. 30 tablet 2   carbonyl iron (FEOSOL) 45 MG TABS tablet Take 45 mg by mouth daily.     diphenoxylate-atropine (LOMOTIL) 2.5-0.025 MG tablet Take 1-2 tablets by mouth 4 (four) times daily as needed for diarrhea or loose stools. 90 tablet 1   DULoxetine (CYMBALTA) 20 MG capsule Take 1 capsule (20 mg total) by mouth daily. 30 capsule 1   gabapentin (NEURONTIN) 100 MG capsule Take 2 capsules (200 mg total) by mouth 3 (three) times daily. 180 capsule 1   Multiple Vitamin (MULTIVITAMIN WITH MINERALS) TABS tablet Take 1 tablet by mouth daily.     ondansetron (ZOFRAN) 8 MG tablet Take 1 tablet (8 mg total) by mouth every 8 (eight) hours as needed for nausea or vomiting. 30 tablet 3   oxyCODONE (OXY IR/ROXICODONE) 5 MG immediate release tablet Take 1 tablet (5 mg total) by mouth every 8 (eight) hours as needed for severe pain or breakthrough pain. 45 tablet 0   No current facility-administered medications for this visit.    PHYSICAL EXAMINATION: ECOG PERFORMANCE STATUS: 1 - Symptomatic but completely ambulatory  Vitals:   02/22/23 0830  BP: 139/74  Pulse: 68  Resp: 18  Temp: 98.6 F (37 C)  SpO2: 99%   Wt Readings from Last 3 Encounters:  02/22/23 143 lb 12.8 oz (65.2 kg)  02/08/23 143 lb 11.2 oz (65.2 kg)  01/18/23 143 lb 12.8 oz (65.2 kg)     GENERAL:alert, no distress and comfortable SKIN: skin color normal, no rashes or significant lesions EYES: normal, Conjunctiva are pink and non-injected,  sclera clear  NEURO: alert & oriented x 3 with fluent speech (+) Lower edema in lower extremity. LABORATORY DATA:  I have reviewed the data as listed    Latest Ref Rng & Units 02/22/2023    7:49 AM 02/08/2023    8:33 AM 01/18/2023    7:58 AM  CBC  WBC 4.0 - 10.5 K/uL 3.1  3.6  5.7   Hemoglobin 13.0 - 17.0 g/dL 16.1  09.6  04.5   Hematocrit 39.0 - 52.0 % 38.7  41.8  41.4   Platelets 150 - 400 K/uL 146  176  199         Latest Ref Rng & Units 02/22/2023    7:49 AM 02/08/2023    8:33 AM 01/18/2023    7:58 AM  CMP  Glucose 70 - 99 mg/dL 409  811  914   BUN 8 - 23 mg/dL Creatinine 0.61 - 1.24 mg/dL 7.82  9.56  2.13   Sodium 135 - 145 mmol/L 138  137  140   Potassium 3.5 -  5.1 mmol/L 4.1  3.7  4.2   Chloride 98 - 111 mmol/L 107  104  105   CO2 22 - 32 mmol/L 27  29  31    Calcium 8.9 - 10.3 mg/dL 8.5  9.2  8.8   Total Protein 6.5 - 8.1 g/dL 6.7  6.9  6.9   Total Bilirubin 0.3 - 1.2 mg/dL 0.4  0.6  0.3   Alkaline Phos 38 - 126 U/L 157  118  173   AST 15 - 41 U/L 23  22  18    ALT 0 - 44 U/L 15  16  12        RADIOGRAPHIC STUDIES: I have personally reviewed the radiological images as listed and agreed with the findings in the report. No results found.    Orders Placed This Encounter  Procedures   CBC with Differential (Cancer Center Only)    Standing Status:   Future    Standing Expiration Date:   04/04/2024   CMP (Cancer Center only)    Standing Status:   Future    Standing Expiration Date:   04/04/2024   Total Protein, Urine dipstick    Standing Status:   Future    Standing Expiration Date:   04/04/2024   All questions were answered. The patient knows to call the clinic with any problems, questions or concerns. No barriers to learning was detected. The total time spent in the appointment was 25 minutes.     Malachy Mood, MD 02/22/2023   Carolin Coy, CMA, am acting as scribe for Malachy Mood, MD.   I have reviewed the above documentation for accuracy and  completeness, and I agree with the above.

## 2023-02-22 ENCOUNTER — Other Ambulatory Visit: Payer: Self-pay

## 2023-02-22 ENCOUNTER — Inpatient Hospital Stay: Payer: Medicare Other

## 2023-02-22 ENCOUNTER — Inpatient Hospital Stay (HOSPITAL_BASED_OUTPATIENT_CLINIC_OR_DEPARTMENT_OTHER): Payer: Medicare Other | Admitting: Hematology

## 2023-02-22 ENCOUNTER — Encounter: Payer: Self-pay | Admitting: Hematology

## 2023-02-22 VITALS — BP 139/74 | HR 68 | Temp 98.6°F | Resp 18 | Ht 72.0 in | Wt 143.8 lb

## 2023-02-22 DIAGNOSIS — T451X5A Adverse effect of antineoplastic and immunosuppressive drugs, initial encounter: Secondary | ICD-10-CM

## 2023-02-22 DIAGNOSIS — Z95828 Presence of other vascular implants and grafts: Secondary | ICD-10-CM

## 2023-02-22 DIAGNOSIS — C20 Malignant neoplasm of rectum: Secondary | ICD-10-CM

## 2023-02-22 DIAGNOSIS — R197 Diarrhea, unspecified: Secondary | ICD-10-CM | POA: Diagnosis not present

## 2023-02-22 DIAGNOSIS — Z515 Encounter for palliative care: Secondary | ICD-10-CM

## 2023-02-22 DIAGNOSIS — Z5112 Encounter for antineoplastic immunotherapy: Secondary | ICD-10-CM | POA: Diagnosis not present

## 2023-02-22 DIAGNOSIS — D500 Iron deficiency anemia secondary to blood loss (chronic): Secondary | ICD-10-CM

## 2023-02-22 DIAGNOSIS — C7800 Secondary malignant neoplasm of unspecified lung: Secondary | ICD-10-CM

## 2023-02-22 DIAGNOSIS — G620 Drug-induced polyneuropathy: Secondary | ICD-10-CM | POA: Diagnosis not present

## 2023-02-22 LAB — FERRITIN: Ferritin: 150 ng/mL (ref 24–336)

## 2023-02-22 LAB — CBC WITH DIFFERENTIAL (CANCER CENTER ONLY)
Abs Immature Granulocytes: 0.01 10*3/uL (ref 0.00–0.07)
Basophils Absolute: 0 10*3/uL (ref 0.0–0.1)
Basophils Relative: 1 %
Eosinophils Absolute: 0.1 10*3/uL (ref 0.0–0.5)
Eosinophils Relative: 2 %
HCT: 38.7 % — ABNORMAL LOW (ref 39.0–52.0)
Hemoglobin: 12.6 g/dL — ABNORMAL LOW (ref 13.0–17.0)
Immature Granulocytes: 0 %
Lymphocytes Relative: 27 %
Lymphs Abs: 0.8 10*3/uL (ref 0.7–4.0)
MCH: 28.3 pg (ref 26.0–34.0)
MCHC: 32.6 g/dL (ref 30.0–36.0)
MCV: 87 fL (ref 80.0–100.0)
Monocytes Absolute: 0.8 10*3/uL (ref 0.1–1.0)
Monocytes Relative: 27 %
Neutro Abs: 1.3 10*3/uL — ABNORMAL LOW (ref 1.7–7.7)
Neutrophils Relative %: 43 %
Platelet Count: 146 10*3/uL — ABNORMAL LOW (ref 150–400)
RBC: 4.45 MIL/uL (ref 4.22–5.81)
RDW: 17.1 % — ABNORMAL HIGH (ref 11.5–15.5)
WBC Count: 3.1 10*3/uL — ABNORMAL LOW (ref 4.0–10.5)
nRBC: 0 % (ref 0.0–0.2)

## 2023-02-22 LAB — CMP (CANCER CENTER ONLY)
ALT: 15 U/L (ref 0–44)
AST: 23 U/L (ref 15–41)
Albumin: 3.6 g/dL (ref 3.5–5.0)
Alkaline Phosphatase: 157 U/L — ABNORMAL HIGH (ref 38–126)
Anion gap: 4 — ABNORMAL LOW (ref 5–15)
BUN: 10 mg/dL (ref 8–23)
CO2: 27 mmol/L (ref 22–32)
Calcium: 8.5 mg/dL — ABNORMAL LOW (ref 8.9–10.3)
Chloride: 107 mmol/L (ref 98–111)
Creatinine: 0.77 mg/dL (ref 0.61–1.24)
GFR, Estimated: >60 mL/min (ref >60)
Glucose, Bld: 121 mg/dL — ABNORMAL HIGH (ref 70–99)
Potassium: 4.1 mmol/L (ref 3.5–5.1)
Sodium: 138 mmol/L (ref 135–145)
Total Bilirubin: 0.4 mg/dL (ref 0.3–1.2)
Total Protein: 6.7 g/dL (ref 6.5–8.1)

## 2023-02-22 LAB — TOTAL PROTEIN, URINE DIPSTICK: Protein, ur: 30 mg/dL — AB

## 2023-02-22 MED ORDER — SODIUM CHLORIDE 0.9 % IV SOLN
5.0000 mg/kg | Freq: Once | INTRAVENOUS | Status: AC
  Administered 2023-02-22: 350 mg via INTRAVENOUS
  Filled 2023-02-22: qty 14

## 2023-02-22 MED ORDER — SODIUM CHLORIDE 0.9 % IV SOLN
140.0000 mg/m2 | Freq: Once | INTRAVENOUS | Status: AC
  Administered 2023-02-22: 260 mg via INTRAVENOUS
  Filled 2023-02-22: qty 13

## 2023-02-22 MED ORDER — PALONOSETRON HCL INJECTION 0.25 MG/5ML
0.2500 mg | Freq: Once | INTRAVENOUS | Status: AC
  Administered 2023-02-22: 0.25 mg via INTRAVENOUS
  Filled 2023-02-22: qty 5

## 2023-02-22 MED ORDER — DIPHENOXYLATE-ATROPINE 2.5-0.025 MG PO TABS
1.0000 | ORAL_TABLET | Freq: Four times a day (QID) | ORAL | 1 refills | Status: DC | PRN
Start: 2023-02-22 — End: 2023-03-29

## 2023-02-22 MED ORDER — SODIUM CHLORIDE 0.9 % IV SOLN
2400.0000 mg/m2 | INTRAVENOUS | Status: DC
  Administered 2023-02-22: 4450 mg via INTRAVENOUS
  Filled 2023-02-22: qty 89

## 2023-02-22 MED ORDER — SODIUM CHLORIDE 0.9% FLUSH
10.0000 mL | Freq: Once | INTRAVENOUS | Status: AC
  Administered 2023-02-22: 10 mL

## 2023-02-22 MED ORDER — SODIUM CHLORIDE 0.9 % IV SOLN: Freq: Once | INTRAVENOUS | Status: AC

## 2023-02-22 MED ORDER — SODIUM CHLORIDE 0.9 % IV SOLN
10.0000 mg | Freq: Once | INTRAVENOUS | Status: AC
  Administered 2023-02-22: 10 mg via INTRAVENOUS
  Filled 2023-02-22: qty 10

## 2023-02-22 MED ORDER — SODIUM CHLORIDE 0.9 % IV SOLN
400.0000 mg/m2 | Freq: Once | INTRAVENOUS | Status: AC
  Administered 2023-02-22: 740 mg via INTRAVENOUS
  Filled 2023-02-22: qty 25

## 2023-02-22 MED ORDER — ATROPINE SULFATE 1 MG/ML IV SOLN
0.5000 mg | Freq: Once | INTRAVENOUS | Status: AC | PRN
  Administered 2023-02-22: 0.5 mg via INTRAVENOUS
  Filled 2023-02-22: qty 1

## 2023-02-22 NOTE — Progress Notes (Signed)
Patient seen by Dr. America Brown are within treatment parameters.  Labs reviewed: and are not all within treatment parameters. WBC  3.1  Per physician team, patient is ready for treatment and there are NO modifications to the treatment plan.

## 2023-02-22 NOTE — Patient Instructions (Signed)
[place] [facility]  Discharge Instructions: Thank you for choosing [facility] to provide your oncology and hematology care.   If you have a lab appointment with the Cancer Center, please go directly to the Cancer Center and check in at the registration area.   Wear comfortable clothing and clothing appropriate for easy access to any Portacath or PICC line.   We strive to give you quality time with your provider. You may need to reschedule your appointment if you arrive late (15 or more minutes).  Arriving late affects you and other patients whose appointments are after yours.  Also, if you miss three or more appointments without notifying the office, you may be dismissed from the clinic at the provider's discretion.      For prescription refill requests, have your pharmacy contact our office and allow 72 hours for refills to be completed.    Today you received the following chemotherapy and/or immunotherapy agents bevacizumab, irinotecan, fluorourcil      To help prevent nausea and vomiting after your treatment, we encourage you to take your nausea medication as directed.  BELOW ARE SYMPTOMS THAT SHOULD BE REPORTED IMMEDIATELY: *FEVER GREATER THAN 100.4 F (38 C) OR HIGHER *CHILLS OR SWEATING *NAUSEA AND VOMITING THAT IS NOT CONTROLLED WITH YOUR NAUSEA MEDICATION *UNUSUAL SHORTNESS OF BREATH *UNUSUAL BRUISING OR BLEEDING *URINARY PROBLEMS (pain or burning when urinating, or frequent urination) *BOWEL PROBLEMS (unusual diarrhea, constipation, pain near the anus) TENDERNESS IN MOUTH AND THROAT WITH OR WITHOUT PRESENCE OF ULCERS (sore throat, sores in mouth, or a toothache) UNUSUAL RASH, SWELLING OR PAIN  UNUSUAL VAGINAL DISCHARGE OR ITCHING   Items with * indicate a potential emergency and should be followed up as soon as possible or go to the Emergency Department if any problems should occur.  Please show the CHEMOTHERAPY ALERT CARD or  IMMUNOTHERAPY ALERT CARD at check-in to the Emergency Department and triage nurse.  Should you have questions after your visit or need to cancel or reschedule your appointment, please contact  [facility]  Dept: [phone]  and follow the prompts.  Office hours are 8:00 a.m. to 4:30 p.m. Monday - Friday. Please note that voicemails left after 4:00 p.m. may not be returned until the following business day.  We are closed weekends and major holidays. You have access to a nurse at all times for urgent questions. Please call the main number to the clinic Dept: [phone] and follow the prompts.   For any non-urgent questions, you may also contact your provider using MyChart. We now offer e-Visits for anyone 52 and older to request care online for non-urgent symptoms. For details visit [email or web address].   Also download the MyChart app! Go to the app store, search "MyChart", open the app, select , and log in with your MyChart username and password.

## 2023-02-24 ENCOUNTER — Inpatient Hospital Stay: Payer: Medicare Other

## 2023-02-24 VITALS — BP 139/83 | HR 75 | Temp 100.4°F | Resp 17

## 2023-02-24 DIAGNOSIS — Z5112 Encounter for antineoplastic immunotherapy: Secondary | ICD-10-CM | POA: Diagnosis not present

## 2023-02-24 DIAGNOSIS — C20 Malignant neoplasm of rectum: Secondary | ICD-10-CM

## 2023-02-24 MED ORDER — HEPARIN SOD (PORK) LOCK FLUSH 100 UNIT/ML IV SOLN
500.0000 [IU] | Freq: Once | INTRAVENOUS | Status: AC | PRN
  Administered 2023-02-24: 500 [IU]

## 2023-02-24 MED ORDER — PEGFILGRASTIM-JMDB 6 MG/0.6ML ~~LOC~~ SOSY
6.0000 mg | PREFILLED_SYRINGE | Freq: Once | SUBCUTANEOUS | Status: AC
  Administered 2023-02-24: 6 mg via SUBCUTANEOUS

## 2023-02-24 MED ORDER — SODIUM CHLORIDE 0.9% FLUSH
10.0000 mL | INTRAVENOUS | Status: DC | PRN
  Administered 2023-02-24: 10 mL

## 2023-02-26 ENCOUNTER — Encounter (HOSPITAL_COMMUNITY): Payer: Self-pay

## 2023-02-26 ENCOUNTER — Ambulatory Visit (HOSPITAL_COMMUNITY)
Admission: RE | Admit: 2023-02-26 | Discharge: 2023-02-26 | Disposition: A | Discharge status: Home / Self Care | Payer: Medicare Other | Source: Ambulatory Visit | Attending: Hematology | Admitting: Hematology

## 2023-02-26 DIAGNOSIS — C20 Malignant neoplasm of rectum: Secondary | ICD-10-CM | POA: Insufficient documentation

## 2023-02-26 MED ORDER — HEPARIN SOD (PORK) LOCK FLUSH 100 UNIT/ML IV SOLN
500.0000 [IU] | Freq: Once | INTRAVENOUS | Status: AC
  Administered 2023-02-26: 500 [IU] via INTRAVENOUS

## 2023-02-26 MED ORDER — IOHEXOL 300 MG/ML  SOLN
100.0000 mL | Freq: Once | INTRAMUSCULAR | Status: AC | PRN
  Administered 2023-02-26: 100 mL via INTRAVENOUS

## 2023-02-26 MED ORDER — HEPARIN SOD (PORK) LOCK FLUSH 100 UNIT/ML IV SOLN
INTRAVENOUS | Status: AC
  Filled 2023-02-26: qty 5

## 2023-02-28 ENCOUNTER — Other Ambulatory Visit: Payer: Self-pay | Admitting: Nurse Practitioner

## 2023-02-28 DIAGNOSIS — G893 Neoplasm related pain (acute) (chronic): Secondary | ICD-10-CM

## 2023-02-28 DIAGNOSIS — M792 Neuralgia and neuritis, unspecified: Secondary | ICD-10-CM

## 2023-02-28 DIAGNOSIS — Z515 Encounter for palliative care: Secondary | ICD-10-CM

## 2023-02-28 DIAGNOSIS — C20 Malignant neoplasm of rectum: Secondary | ICD-10-CM

## 2023-02-28 MED ORDER — OXYCODONE HCL 5 MG PO TABS
5.0000 mg | ORAL_TABLET | Freq: Three times a day (TID) | ORAL | 0 refills | Status: DC | PRN
Start: 2023-03-02 — End: 2023-03-14

## 2023-02-28 NOTE — Telephone Encounter (Signed)
From: Clearence Cheek To: [name], NP Sent: 02/28/2023 7:23 AM EDT Subject: Medication Renewal Request  Refills have been requested for the following medications:   oxyCODONE (OXY IR/ROXICODONE) 5 MG immediate release tablet Jorge [name]]  Preferred pharmacy: [id number] [place], [place] - [address] Delivery method: [employer]

## 2023-03-05 ENCOUNTER — Other Ambulatory Visit: Payer: Self-pay | Admitting: Nurse Practitioner

## 2023-03-05 ENCOUNTER — Encounter: Payer: Self-pay | Admitting: Hematology

## 2023-03-05 DIAGNOSIS — C20 Malignant neoplasm of rectum: Secondary | ICD-10-CM

## 2023-03-08 ENCOUNTER — Ambulatory Visit: Payer: Medicare Other | Admitting: Nurse Practitioner

## 2023-03-08 ENCOUNTER — Other Ambulatory Visit: Payer: Medicare Other

## 2023-03-08 ENCOUNTER — Ambulatory Visit: Payer: Medicare Other

## 2023-03-10 ENCOUNTER — Inpatient Hospital Stay: Payer: Medicare Other

## 2023-03-13 NOTE — Progress Notes (Unsigned)
Palliative Medicine [facility]  Telephone:(336) [phone] Fax:(336) [phone]   Name: Jorge [name] Date: 03/13/2023 MRN: [id number]  DOB: Feb 14, 1951  Patient Care Team: Pcp, No as PCP - General Pickenpack-Cousar, Arty Baumgartner, NP as Nurse Practitioner (Nurse Practitioner) Malachy Mood, MD as Consulting Physician (Hematology)   INTERVAL HISTORY: Jorge [name] is a 72 y.o. 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  Palliative ask to see for symptom management and goals of care.   SOCIAL HISTORY:     reports that he has been smoking cigarettes. He has a 5.00 pack-year smoking history. He has never used smokeless tobacco. He reports that he does not currently use alcohol. He reports that he does not use drugs.  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  MEDICATIONS:  Current Outpatient Medications  Medication Sig Dispense Refill   amLODipine (NORVASC) 10 MG tablet Take 1 tablet (10 mg total) by mouth daily. 30 tablet 1   amLODipine (NORVASC) 5 MG tablet TAKE 1 TABLET (5 MG TOTAL) BY MOUTH DAILY. 30 tablet 2   carbonyl iron (FEOSOL) 45 MG TABS tablet Take 45 mg by mouth daily.     diphenoxylate-atropine (LOMOTIL) 2.5-0.025 MG tablet Take 1-2 tablets by mouth 4 (four) times daily as needed for diarrhea or loose stools. 90 tablet 1   DULoxetine (CYMBALTA) 20 MG capsule Take 1 capsule (20 mg total) by mouth daily. 30 capsule 1   gabapentin (NEURONTIN) 100 MG capsule Take 2 capsules (200 mg total) by mouth 3 (three) times daily. 180 capsule 1   Multiple Vitamin (MULTIVITAMIN WITH MINERALS) TABS tablet Take 1 tablet by mouth daily.     ondansetron (ZOFRAN) 8 MG tablet Take 1 tablet (8 mg total) by mouth every 8 (eight)  hours as needed for nausea or vomiting. 30 tablet 3   oxyCODONE (OXY IR/ROXICODONE) 5 MG immediate release tablet Take 1 tablet (5 mg total) by mouth every 8 (eight) hours as needed for severe pain or breakthrough pain. 45 tablet 0   No current facility-administered medications for this visit.    VITAL SIGNS: There were no vitals taken for this visit. There were no vitals filed for this visit.   Estimated body mass index is 19.5 kg/m as calculated from the following:   Height as of 02/22/23: 6' (1.829 m).   Weight as of 02/22/23: 143 lb 12.8 oz (65.2 kg).   PERFORMANCE STATUS (ECOG) : 1 - Symptomatic but completely ambulatory  Assessment NAD, ambulatory RRR Normal breathing pattern  AAO x3, mood appropriate    IMPRESSION: I saw Jorge [name] during infusion. No acute distress. Resting comfortably in recliner. States overall he is doing well. Denies nausea, vomiting, constipation. Some loose stools however controlled. Is remaining as active as possible. Walks a lot outside.   Neuropathic Pain  Jorge [name]'s pain continues to be well controlled on current regimen. Some days are better than others pending level of activity and around treatment days. Much improvement in his neuropathic symptoms allowing him to walk further distance and longer. He is tolerating cymbalta and gabapentin. Oxy IR as needed for severe pain/discomfort.   No changes to current regimen.   Will continue to closely monitor and support as needed.  PLAN:  Gabapentin 300mg  three times daily.   Cymbalta 20mg  daily. Tolerating well.  Oxy IR 5mg  every 8 hours as needed. Not requiring around the clock.  I will plan to see patient back in 3-4 weeks in collaboration with his oncology appointments.   Patient expressed understanding and was in agreement with this plan. He also understands that He can call the clinic at any time with any questions, concerns, or complaints.     Any controlled substances utilized were  prescribed in the context of palliative care. PDMP has been reviewed.    Visit consisted of counseling and education dealing with the complex and emotionally intense issues of symptom management and palliative care in the setting of serious and potentially life-threatening illness.Greater than 50%  of this time was spent counseling and coordinating care related to the above assessment and plan.  Willette Alma, AGPCNP-BC  Palliative Medicine Team/ Cancer Center  *Please note that this is a verbal dictation therefore any spelling or grammatical errors are due to the "[employer] One" system interpretation.

## 2023-03-14 ENCOUNTER — Other Ambulatory Visit: Payer: Self-pay | Admitting: Nurse Practitioner

## 2023-03-14 DIAGNOSIS — C7800 Secondary malignant neoplasm of unspecified lung: Secondary | ICD-10-CM

## 2023-03-14 DIAGNOSIS — Z515 Encounter for palliative care: Secondary | ICD-10-CM

## 2023-03-14 DIAGNOSIS — M792 Neuralgia and neuritis, unspecified: Secondary | ICD-10-CM

## 2023-03-14 DIAGNOSIS — G893 Neoplasm related pain (acute) (chronic): Secondary | ICD-10-CM

## 2023-03-14 MED ORDER — OXYCODONE HCL 5 MG PO TABS: 5.0000 mg | ORAL_TABLET | Freq: Three times a day (TID) | ORAL | 0 refills | Status: DC | PRN

## 2023-03-14 MED FILL — Dexamethasone Sodium Phosphate Inj 100 MG/10ML: INTRAMUSCULAR | Qty: 1 | Status: AC

## 2023-03-14 NOTE — Telephone Encounter (Signed)
From: Clearence Cheek To: [name], NP Sent: 03/14/2023 11:08 AM EDT Subject: Medication Renewal Request  Refills have been requested for the following medications:   oxyCODONE (OXY IR/ROXICODONE) 5 MG immediate release tablet Jorge [name]]  Preferred pharmacy: [id number] [place], [place] - [address] Delivery method: [employer]

## 2023-03-15 ENCOUNTER — Other Ambulatory Visit: Payer: Self-pay

## 2023-03-15 ENCOUNTER — Encounter: Payer: Self-pay | Admitting: Hematology

## 2023-03-15 ENCOUNTER — Inpatient Hospital Stay: Payer: Medicare Other

## 2023-03-15 ENCOUNTER — Encounter: Payer: Self-pay | Admitting: Nurse Practitioner

## 2023-03-15 ENCOUNTER — Inpatient Hospital Stay (HOSPITAL_BASED_OUTPATIENT_CLINIC_OR_DEPARTMENT_OTHER): Payer: Medicare Other | Admitting: Nurse Practitioner

## 2023-03-15 ENCOUNTER — Inpatient Hospital Stay (HOSPITAL_BASED_OUTPATIENT_CLINIC_OR_DEPARTMENT_OTHER): Payer: Medicare Other | Admitting: Hematology

## 2023-03-15 ENCOUNTER — Inpatient Hospital Stay: Payer: Medicare Other | Attending: Physician Assistant

## 2023-03-15 VITALS — BP 147/68 | HR 83 | Temp 98.2°F | Resp 18 | Ht 72.0 in | Wt 143.8 lb

## 2023-03-15 DIAGNOSIS — Z95828 Presence of other vascular implants and grafts: Secondary | ICD-10-CM

## 2023-03-15 DIAGNOSIS — Z5112 Encounter for antineoplastic immunotherapy: Secondary | ICD-10-CM | POA: Diagnosis present

## 2023-03-15 DIAGNOSIS — Z5189 Encounter for other specified aftercare: Secondary | ICD-10-CM | POA: Insufficient documentation

## 2023-03-15 DIAGNOSIS — G893 Neoplasm related pain (acute) (chronic): Secondary | ICD-10-CM | POA: Diagnosis not present

## 2023-03-15 DIAGNOSIS — M792 Neuralgia and neuritis, unspecified: Secondary | ICD-10-CM

## 2023-03-15 DIAGNOSIS — C20 Malignant neoplasm of rectum: Secondary | ICD-10-CM

## 2023-03-15 DIAGNOSIS — Z5111 Encounter for antineoplastic chemotherapy: Secondary | ICD-10-CM | POA: Insufficient documentation

## 2023-03-15 DIAGNOSIS — C7801 Secondary malignant neoplasm of right lung: Secondary | ICD-10-CM | POA: Diagnosis not present

## 2023-03-15 DIAGNOSIS — Z515 Encounter for palliative care: Secondary | ICD-10-CM | POA: Diagnosis not present

## 2023-03-15 DIAGNOSIS — C7800 Secondary malignant neoplasm of unspecified lung: Secondary | ICD-10-CM | POA: Diagnosis not present

## 2023-03-15 DIAGNOSIS — C787 Secondary malignant neoplasm of liver and intrahepatic bile duct: Secondary | ICD-10-CM | POA: Diagnosis not present

## 2023-03-15 LAB — CBC WITH DIFFERENTIAL/PLATELET
Abs Immature Granulocytes: 0.03 10*3/uL (ref 0.00–0.07)
Basophils Absolute: 0 10*3/uL (ref 0.0–0.1)
Basophils Relative: 1 %
Eosinophils Absolute: 0.1 10*3/uL (ref 0.0–0.5)
Eosinophils Relative: 2 %
HCT: 36.9 % — ABNORMAL LOW (ref 39.0–52.0)
Hemoglobin: 12 g/dL — ABNORMAL LOW (ref 13.0–17.0)
Immature Granulocytes: 1 %
Lymphocytes Relative: 14 %
Lymphs Abs: 0.9 10*3/uL (ref 0.7–4.0)
MCH: 28.5 pg (ref 26.0–34.0)
MCHC: 32.5 g/dL (ref 30.0–36.0)
MCV: 87.6 fL (ref 80.0–100.0)
Monocytes Absolute: 0.7 10*3/uL (ref 0.1–1.0)
Monocytes Relative: 11 %
Neutro Abs: 4.6 10*3/uL (ref 1.7–7.7)
Neutrophils Relative %: 71 %
Platelets: 299 10*3/uL (ref 150–400)
RBC: 4.21 MIL/uL — ABNORMAL LOW (ref 4.22–5.81)
RDW: 16.6 % — ABNORMAL HIGH (ref 11.5–15.5)
WBC: 6.3 10*3/uL (ref 4.0–10.5)
nRBC: 0 % (ref 0.0–0.2)

## 2023-03-15 LAB — COMPREHENSIVE METABOLIC PANEL
ALT: 13 U/L (ref 0–44)
AST: 16 U/L (ref 15–41)
Albumin: 3.1 g/dL — ABNORMAL LOW (ref 3.5–5.0)
Alkaline Phosphatase: 118 U/L (ref 38–126)
Anion gap: 4 — ABNORMAL LOW (ref 5–15)
BUN: 7 mg/dL — ABNORMAL LOW (ref 8–23)
CO2: 30 mmol/L (ref 22–32)
Calcium: 8.3 mg/dL — ABNORMAL LOW (ref 8.9–10.3)
Chloride: 103 mmol/L (ref 98–111)
Creatinine, Ser: 0.64 mg/dL (ref 0.61–1.24)
GFR, Estimated: >60 mL/min (ref >60)
Glucose, Bld: 177 mg/dL — ABNORMAL HIGH (ref 70–99)
Potassium: 3.8 mmol/L (ref 3.5–5.1)
Sodium: 137 mmol/L (ref 135–145)
Total Bilirubin: 0.3 mg/dL (ref 0.3–1.2)
Total Protein: 7.1 g/dL (ref 6.5–8.1)

## 2023-03-15 LAB — TOTAL PROTEIN, URINE DIPSTICK: Protein, ur: 30 mg/dL — AB

## 2023-03-15 MED ORDER — SODIUM CHLORIDE 0.9 % IV SOLN: Freq: Once | INTRAVENOUS | Status: DC

## 2023-03-15 MED ORDER — SODIUM CHLORIDE 0.9 % IV SOLN
10.0000 mg | Freq: Once | INTRAVENOUS | Status: AC
  Administered 2023-03-15: 10 mg via INTRAVENOUS
  Filled 2023-03-15: qty 10

## 2023-03-15 MED ORDER — SODIUM CHLORIDE 0.9 % IV SOLN
2400.0000 mg/m2 | INTRAVENOUS | Status: DC
  Administered 2023-03-15: 4450 mg via INTRAVENOUS
  Filled 2023-03-15: qty 89

## 2023-03-15 MED ORDER — HEPARIN SOD (PORK) LOCK FLUSH 100 UNIT/ML IV SOLN: 500.0000 [IU] | Freq: Once | INTRAVENOUS | Status: DC | PRN

## 2023-03-15 MED ORDER — SODIUM CHLORIDE 0.9 % IV SOLN
400.0000 mg/m2 | Freq: Once | INTRAVENOUS | Status: AC
  Administered 2023-03-15: 740 mg via INTRAVENOUS
  Filled 2023-03-15: qty 25

## 2023-03-15 MED ORDER — PALONOSETRON HCL INJECTION 0.25 MG/5ML
0.2500 mg | Freq: Once | INTRAVENOUS | Status: AC
  Administered 2023-03-15: 0.25 mg via INTRAVENOUS
  Filled 2023-03-15: qty 5

## 2023-03-15 MED ORDER — ATROPINE SULFATE 1 MG/ML IV SOLN
0.5000 mg | Freq: Once | INTRAVENOUS | Status: AC | PRN
  Administered 2023-03-15: 0.5 mg via INTRAVENOUS
  Filled 2023-03-15: qty 1

## 2023-03-15 MED ORDER — SODIUM CHLORIDE 0.9 % IV SOLN
5.0000 mg/kg | Freq: Once | INTRAVENOUS | Status: AC
  Administered 2023-03-15: 350 mg via INTRAVENOUS
  Filled 2023-03-15: qty 14

## 2023-03-15 MED ORDER — SODIUM CHLORIDE 0.9 % IV SOLN
140.0000 mg/m2 | Freq: Once | INTRAVENOUS | Status: AC
  Administered 2023-03-15: 260 mg via INTRAVENOUS
  Filled 2023-03-15: qty 13

## 2023-03-15 MED ORDER — SODIUM CHLORIDE 0.9% FLUSH: 10.0000 mL | INTRAVENOUS | Status: DC | PRN

## 2023-03-15 MED ORDER — SODIUM CHLORIDE 0.9% FLUSH
10.0000 mL | Freq: Once | INTRAVENOUS | Status: AC
  Administered 2023-03-15: 10 mL

## 2023-03-15 MED ORDER — SODIUM CHLORIDE 0.9 % IV SOLN: Freq: Once | INTRAVENOUS | Status: AC

## 2023-03-15 NOTE — Progress Notes (Signed)
[facility]   Telephone:([phone]) [phone] Fax:([phone]) [phone]   Clinic Follow up Note   Patient Care Team: Pcp, No as PCP - General Pickenpack-Cousar, Arty Baumgartner, NP as Nurse Practitioner (Nurse Practitioner) Malachy Mood, MD as Consulting Physician (Hematology)  Date of Service:  03/15/2023  CHIEF COMPLAINT: f/u of  metastatic rectal cancer   CURRENT THERAPY:  Second-line FOLFIRI, q2weeks, started 02/22/22 -Bevacizumab started 11/24/21   ASSESSMENT:  Jorge [name] is a 72 y.o. 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 Staging MRI showed early extra mesorectal lymph node involvement.  -baseline CEA 571.84 on 09/06/21.  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 Xeloda dose reduced due to elevated liver enzymes.  -due to new liver lesions, we switched to second-line FOLFIRI, with continued beva, on 02/21/22. He tolerates well overall.  - his recent restaging CT scan from 11/21/2022 showed stable lung and liver metastasis, no new lesions.   -Will continue current chemotherapy.  We previously discussed maintenance irinotecan alone, since he is tolerating FOLFIRI well, will continue for now. - restaging CT scan on April 22 showed stable disease overall.  I personally reviewed the scan and discussed the findings with him.    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 He is on gabapentin and cymbalta, taking 2-3 oxycodone a day. He is followed by NP Lowella Bandy.         PLAN: -lab reviewed, will proceed chemo today -CT scan reviewed, SD -f/u in 2 weeks  SUMMARY OF ONCOLOGIC HISTORY: Oncology History Overview Note  Cancer Staging Rectal cancer [facility]) Staging form: Colon and  Rectum, AJCC 8th Edition - Clinical stage from 08/29/2021: Jorge [name], cM1 - Signed by Malachy Mood, MD on 08/29/2021    Rectal cancer (HCC)  07/23/2021 Imaging   CT AP  IMPRESSION: 1. Appearance of the rectum likely indicates a rectal mass suspicious for rectal carcinoma. Inflammatory process would be less likely. Direct visualization is suggested. 2. Cholelithiasis without evidence of acute cholecystitis. 3. Aortic atherosclerosis.   08/19/2021 Procedure   Colonoscopy, under Dr. Meridee Score  Impression: - Rectal tenderness, palpable rectal mass and hemorrhoids found on digital rectal exam. - Stool in the entire examined colon - lavaged with still inadequate clearance. - One 35 mm polyp at the hepatic flexure. Biopsied. Tattooed distal in case future endoscopic resection is considered - however, has larger issues with rectal mass currently as below. - Four, 5 to 11 mm polyps in the transverse colon and at the hepatic flexure, removed with a cold snare. Resected and retrieved. - Diverticulosis in the recto-sigmoid colon and in the sigmoid colon. - Rule out malignancy, partially obstructing tumor in the rectum and from 6 to 13 cm proximal to the anus. Biopsied.   08/19/2021 Pathology Results   Diagnosis 1. Hepatic Flexure Biopsy - TUBULAR ADENOMA WITHOUT HIGH-GRADE DYSPLASIA OR MALIGNANCY 2. Transverse Colon Biopsy, and hepatic flexure, polyps (4) - TUBULAR ADENOMA WITHOUT HIGH-GRADE DYSPLASIA OR MALIGNANCY - OTHER FRAGMENTS OF POLYPOID COLONIC MUCOSA WITH NO SPECIFIC HISTOPATHOLOGIC CHANGES - FOOD MATERIAL 3. Rectum, biopsy - ADENOCARCINOMA. SEE NOTE   08/22/2021 Initial Diagnosis   Rectal cancer (HCC)   08/26/2021 Imaging   IMPRESSION: 1. A 2.2 x 1.9 cm right middle lobe pulmonary nodule as well as a total  of four right upper lobe pulmonary nodule and masses measuring up to 3.6 cm. Findings concerning for metastatic primary lung cancer versus less likely metastases in a patient  with rectal cancer. Additional imaging evaluation or consultation with Pulmonology or Thoracic Surgery recommended. 2. No gross hilar adenopathy, noting limited sensitivity for the detection of hilar adenopathy on this noncontrast study. 3. Cholelithiasis. 4.  Emphysema (ICD10-J43.9). 5. At least left anterior descending coronary artery calcifications.   08/27/2021 Imaging   IMPRESSION: Rectal adenocarcinoma T stage: T4 B   Rectal adenocarcinoma N stage: N2 disease likely associated with early extra mesorectal lymph node involvement.   Distance from tumor to the internal anal sphincter is 1.2 cm.   Also with extramural venous involvement as described.   08/29/2021 Cancer Staging   Staging form: Colon and Rectum, AJCC 8th Edition - Clinical stage from 08/29/2021: Jorge [name], cM1 - Signed by Malachy Mood, MD on 08/29/2021 Stage prefix: Initial diagnosis   01/23/2022 Imaging   CT CAP w contrast IMPRESSION: 1. Mild interval decrease in size of multiple right-sided pulmonary nodules. No new suspicious pulmonary nodule or mass. 2. Interval development of approximately 10 new ill-defined hypoattenuating liver lesions ranging in size from approximately 5-10 mm. Imaging features suspicious for metastatic disease. MRI abdomen with and without contrast may prove helpful to further evaluate. 3. Similar appearance of wall thickening in the rectum with perirectal edema. 4. Cholelithiasis. 5. Prostatomegaly. 6. Aortic Atherosclerosis (ICD10-I70.0).   11/21/2022 Imaging    IMPRESSION: 1. Stable exam. No new or progressive interval findings. 2. The primary rectosigmoid lesion described previously is not well seen on the current study. There is presacral and perirectal edema, likely treatment related. 3. No substantial change in appearance of bilateral pulmonary metastases. 4. Stable tiny hypodensities in both hepatic lobes. These were previously characterized as metastatic disease. No new liver  lesions on today's exam. 5. Cholelithiasis. 6. 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 He was last seen by me on 02/22/2023. He is clinically doing well, had 2 days diarrhea after last cycle chemo but recovered well    All other systems were reviewed with the patient and are negative.  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    SURGICAL HISTORY: Past Surgical History:  Procedure Laterality Date   BRONCHIAL BIOPSY  09/27/2021   Procedure: BRONCHIAL BIOPSIES;  Surgeon: Josephine Igo, DO;  Location: [facility];  Service: Pulmonary;;   BRONCHIAL BRUSHINGS  09/27/2021   Procedure: BRONCHIAL BRUSHINGS;  Surgeon: Josephine Igo, DO;  Location: [facility];  Service: Pulmonary;;   BRONCHIAL NEEDLE ASPIRATION BIOPSY  09/27/2021   Procedure: BRONCHIAL NEEDLE ASPIRATION BIOPSIES;  Surgeon: Josephine Igo, DO;  Location: [facility];  Service: Pulmonary;;   COLONOSCOPY     IR IMAGING GUIDED  PORT INSERTION  10/24/2021   left breast surgery Left 1968   VIDEO BRONCHOSCOPY WITH RADIAL ENDOBRONCHIAL ULTRASOUND  09/27/2021   Procedure: RADIAL ENDOBRONCHIAL ULTRASOUND;  Surgeon: Josephine Igo, DO;  Location: [facility];  Service: Pulmonary;;    I have reviewed the social history and family history with the  patient and they are unchanged from previous note.  ALLERGIES:  has No Known Allergies.  MEDICATIONS:  Current Outpatient Medications  Medication Sig Dispense Refill   amLODipine (NORVASC) 10 MG tablet Take 1 tablet (10 mg total) by mouth daily. 30 tablet 1   amLODipine (NORVASC) 5 MG tablet TAKE 1 TABLET (5 MG TOTAL) BY MOUTH DAILY. 30 tablet 2   carbonyl iron (FEOSOL) 45 MG TABS tablet Take 45 mg by mouth daily.     diphenoxylate-atropine (LOMOTIL) 2.5-0.025 MG tablet Take 1-2 tablets by mouth 4 (four) times daily as needed for diarrhea or loose stools. 90 tablet 1   DULoxetine (CYMBALTA) 20 MG capsule Take 1 capsule (20 mg total) by mouth daily. 30 capsule 1   gabapentin (NEURONTIN) 100 MG capsule Take 2 capsules (200 mg total) by mouth 3 (three) times daily. 180 capsule 1   Multiple Vitamin (MULTIVITAMIN WITH MINERALS) TABS tablet Take 1 tablet by mouth daily.     ondansetron (ZOFRAN) 8 MG tablet Take 1 tablet (8 mg total) by mouth every 8 (eight) hours as needed for nausea or vomiting. 30 tablet 3   [START ON 03/16/2023] oxyCODONE (OXY IR/ROXICODONE) 5 MG immediate release tablet Take 1 tablet (5 mg total) by mouth every 8 (eight) hours as needed for severe pain or breakthrough pain. 45 tablet 0   No current facility-administered medications for this visit.    PHYSICAL EXAMINATION: ECOG PERFORMANCE STATUS: 1 - Symptomatic but completely ambulatory  There were no vitals filed for this visit.  Wt Readings from Last 3 Encounters:  02/22/23 143 lb 12.8 oz (65.2 kg)  02/08/23 143 lb 11.2 oz (65.2 kg)  01/18/23 143 lb 12.8 oz (65.2 kg)     GENERAL:alert, no distress and comfortable SKIN: skin color normal, no rashes or significant lesions EYES: normal, Conjunctiva are pink and non-injected, sclera clear  NEURO: alert & oriented x 3 with fluent speech (+) Lower edema in lower extremity. LABORATORY DATA:  I have reviewed the data as listed    Latest Ref Rng & Units 02/22/2023     7:49 AM 02/08/2023    8:33 AM 01/18/2023    7:58 AM  CBC  WBC 4.0 - 10.5 K/uL 3.1  3.6  5.7   Hemoglobin 13.0 - 17.0 g/dL 16.1  09.6  04.5   Hematocrit 39.0 - 52.0 % 38.7  41.8  41.4   Platelets 150 - 400 K/uL 146  176  199         Latest Ref Rng & Units 02/22/2023    7:49 AM 02/08/2023    8:33 AM 01/18/2023    7:58 AM  CMP  Glucose 70 - 99 mg/dL 409  811  914   BUN 8 - 23 mg/dL 10  12  11    Creatinine 0.61 - 1.24 mg/dL 7.82  9.56  2.13   Sodium 135 - 145 mmol/L 138  137  140   Potassium 3.5 - 5.1 mmol/L 4.1  3.7  4.2   Chloride 98 - 111 mmol/L 107  104  105   CO2 22 - 32 mmol/L 27  29  31    Calcium 8.9 - 10.3 mg/dL 8.5  9.2  8.8   Total Protein 6.5 - 8.1 g/dL 6.7  6.9  6.9   Total Bilirubin 0.3 - 1.2 mg/dL 0.4  0.6  0.3   Alkaline Phos 38 - 126 U/L 157  118  173   AST 15 - 41 U/L 23  22  18    ALT 0 - 44 U/L 15  16  12        RADIOGRAPHIC STUDIES: I have personally reviewed the radiological images as listed and agreed with the findings in the report. No results found.    No orders of the defined types were placed in this encounter.  All questions were answered. The patient knows to call the clinic with any problems, questions or concerns. No barriers to learning was detected. The total time spent in the appointment was 25 minutes.     Malachy Mood, MD 03/15/2023

## 2023-03-15 NOTE — Patient Instructions (Signed)
[name] [facility]  Discharge Instructions: Thank you for choosing [facility] to provide your oncology and hematology care.   If you have a lab appointment with the Cancer Center, please go directly to the Cancer Center and check in at the registration area.   Wear comfortable clothing and clothing appropriate for easy access to any Portacath or PICC line.   We strive to give you quality time with your provider. You may need to reschedule your appointment if you arrive late (15 or more minutes).  Arriving late affects you and other patients whose appointments are after yours.  Also, if you miss three or more appointments without notifying the office, you may be dismissed from the clinic at the provider's discretion.      For prescription refill requests, have your pharmacy contact our office and allow 72 hours for refills to be completed.    Today you received the following chemotherapy and/or immunotherapy agents: Bevacizumab, Irinotecan, Leucovorin, Fluorouracil.       To help prevent nausea and vomiting after your treatment, we encourage you to take your nausea medication as directed.  BELOW ARE SYMPTOMS THAT SHOULD BE REPORTED IMMEDIATELY: *FEVER GREATER THAN 100.4 F (38 C) OR HIGHER *CHILLS OR SWEATING *NAUSEA AND VOMITING THAT IS NOT CONTROLLED WITH YOUR NAUSEA MEDICATION *UNUSUAL SHORTNESS OF BREATH *UNUSUAL BRUISING OR BLEEDING *URINARY PROBLEMS (pain or burning when urinating, or frequent urination) *BOWEL PROBLEMS (unusual diarrhea, constipation, pain near the anus) TENDERNESS IN MOUTH AND THROAT WITH OR WITHOUT PRESENCE OF ULCERS (sore throat, sores in mouth, or a toothache) UNUSUAL RASH, SWELLING OR PAIN  UNUSUAL VAGINAL DISCHARGE OR ITCHING   Items with * indicate a potential emergency and should be followed up as soon as possible or go to the Emergency Department if any problems should occur.  Please show the CHEMOTHERAPY ALERT  CARD or IMMUNOTHERAPY ALERT CARD at check-in to the Emergency Department and triage nurse.  Should you have questions after your visit or need to cancel or reschedule your appointment, please contact  CANC[facility]ENTER AT [facility]  Dept: [phone]  and follow the prompts.  Office hours are 8:00 a.m. to 4:30 p.m. Monday - Friday. Please note that voicemails left after 4:00 p.m. may not be returned until the following business day.  We are closed weekends and major holidays. You have access to a nurse at all times for urgent questions. Please call the main number to the clinic Dept: [phone] and follow the prompts.   For any non-urgent questions, you may also contact your provider using MyChart. We now offer e-Visits for anyone 74 and older to request care online for non-urgent symptoms. For details visit [email or web address].   Also download the MyChart app! Go to the app store, search "MyChart", open the app, select , and log in with your MyChart username and password.

## 2023-03-17 ENCOUNTER — Inpatient Hospital Stay: Payer: Medicare Other

## 2023-03-17 VITALS — BP 129/80 | HR 78 | Temp 97.8°F | Resp 18

## 2023-03-17 DIAGNOSIS — C7800 Secondary malignant neoplasm of unspecified lung: Secondary | ICD-10-CM

## 2023-03-17 DIAGNOSIS — Z5112 Encounter for antineoplastic immunotherapy: Secondary | ICD-10-CM | POA: Diagnosis not present

## 2023-03-17 MED ORDER — HEPARIN SOD (PORK) LOCK FLUSH 100 UNIT/ML IV SOLN
500.0000 [IU] | Freq: Once | INTRAVENOUS | Status: AC | PRN
  Administered 2023-03-17: 500 [IU]

## 2023-03-17 MED ORDER — SODIUM CHLORIDE 0.9% FLUSH
10.0000 mL | INTRAVENOUS | Status: DC | PRN
  Administered 2023-03-17: 10 mL

## 2023-03-17 MED ORDER — PEGFILGRASTIM-JMDB 6 MG/0.6ML ~~LOC~~ SOSY
6.0000 mg | PREFILLED_SYRINGE | Freq: Once | SUBCUTANEOUS | Status: AC
  Administered 2023-03-17: 6 mg via SUBCUTANEOUS

## 2023-03-22 ENCOUNTER — Other Ambulatory Visit: Payer: Medicare Other

## 2023-03-22 ENCOUNTER — Ambulatory Visit: Payer: Medicare Other | Admitting: Hematology

## 2023-03-22 ENCOUNTER — Ambulatory Visit: Payer: Medicare Other

## 2023-03-28 MED FILL — Dexamethasone Sodium Phosphate Inj 100 MG/10ML: INTRAMUSCULAR | Qty: 1 | Status: AC

## 2023-03-29 ENCOUNTER — Encounter: Payer: Self-pay | Admitting: Hematology

## 2023-03-29 ENCOUNTER — Inpatient Hospital Stay (HOSPITAL_BASED_OUTPATIENT_CLINIC_OR_DEPARTMENT_OTHER): Payer: Medicare Other | Admitting: Hematology

## 2023-03-29 ENCOUNTER — Inpatient Hospital Stay: Payer: Medicare Other

## 2023-03-29 ENCOUNTER — Other Ambulatory Visit: Payer: Self-pay

## 2023-03-29 ENCOUNTER — Other Ambulatory Visit: Payer: Self-pay | Admitting: Nurse Practitioner

## 2023-03-29 VITALS — BP 155/84 | HR 72 | Temp 97.8°F | Resp 17 | Wt 142.9 lb

## 2023-03-29 DIAGNOSIS — M792 Neuralgia and neuritis, unspecified: Secondary | ICD-10-CM

## 2023-03-29 DIAGNOSIS — Z515 Encounter for palliative care: Secondary | ICD-10-CM

## 2023-03-29 DIAGNOSIS — Z95828 Presence of other vascular implants and grafts: Secondary | ICD-10-CM

## 2023-03-29 DIAGNOSIS — G620 Drug-induced polyneuropathy: Secondary | ICD-10-CM | POA: Diagnosis not present

## 2023-03-29 DIAGNOSIS — R110 Nausea: Secondary | ICD-10-CM | POA: Diagnosis not present

## 2023-03-29 DIAGNOSIS — C20 Malignant neoplasm of rectum: Secondary | ICD-10-CM

## 2023-03-29 DIAGNOSIS — G893 Neoplasm related pain (acute) (chronic): Secondary | ICD-10-CM

## 2023-03-29 DIAGNOSIS — Z5112 Encounter for antineoplastic immunotherapy: Secondary | ICD-10-CM | POA: Diagnosis not present

## 2023-03-29 DIAGNOSIS — R197 Diarrhea, unspecified: Secondary | ICD-10-CM | POA: Diagnosis not present

## 2023-03-29 DIAGNOSIS — T451X5A Adverse effect of antineoplastic and immunosuppressive drugs, initial encounter: Secondary | ICD-10-CM

## 2023-03-29 DIAGNOSIS — C7800 Secondary malignant neoplasm of unspecified lung: Secondary | ICD-10-CM

## 2023-03-29 LAB — CBC WITH DIFFERENTIAL (CANCER CENTER ONLY)
Abs Immature Granulocytes: 0.03 10*3/uL (ref 0.00–0.07)
Basophils Absolute: 0.1 10*3/uL (ref 0.0–0.1)
Basophils Relative: 1 %
Eosinophils Absolute: 0.3 10*3/uL (ref 0.0–0.5)
Eosinophils Relative: 5 %
HCT: 39.8 % (ref 39.0–52.0)
Hemoglobin: 12.7 g/dL — ABNORMAL LOW (ref 13.0–17.0)
Immature Granulocytes: 1 %
Lymphocytes Relative: 15 %
Lymphs Abs: 1 10*3/uL (ref 0.7–4.0)
MCH: 28.3 pg (ref 26.0–34.0)
MCHC: 31.9 g/dL (ref 30.0–36.0)
MCV: 88.8 fL (ref 80.0–100.0)
Monocytes Absolute: 0.8 10*3/uL (ref 0.1–1.0)
Monocytes Relative: 12 %
Neutro Abs: 4.3 10*3/uL (ref 1.7–7.7)
Neutrophils Relative %: 66 %
Platelet Count: 209 10*3/uL (ref 150–400)
RBC: 4.48 MIL/uL (ref 4.22–5.81)
RDW: 17.7 % — ABNORMAL HIGH (ref 11.5–15.5)
WBC Count: 6.5 10*3/uL (ref 4.0–10.5)
nRBC: 0 % (ref 0.0–0.2)

## 2023-03-29 LAB — CMP (CANCER CENTER ONLY)
ALT: 13 U/L (ref 0–44)
AST: 20 U/L (ref 15–41)
Albumin: 3.5 g/dL (ref 3.5–5.0)
Alkaline Phosphatase: 151 U/L — ABNORMAL HIGH (ref 38–126)
Anion gap: 4 — ABNORMAL LOW (ref 5–15)
BUN: 7 mg/dL — ABNORMAL LOW (ref 8–23)
CO2: 31 mmol/L (ref 22–32)
Calcium: 8.6 mg/dL — ABNORMAL LOW (ref 8.9–10.3)
Chloride: 104 mmol/L (ref 98–111)
Creatinine: 0.7 mg/dL (ref 0.61–1.24)
GFR, Estimated: >60 mL/min (ref >60)
Glucose, Bld: 108 mg/dL — ABNORMAL HIGH (ref 70–99)
Potassium: 4 mmol/L (ref 3.5–5.1)
Sodium: 139 mmol/L (ref 135–145)
Total Bilirubin: 0.4 mg/dL (ref 0.3–1.2)
Total Protein: 7.1 g/dL (ref 6.5–8.1)

## 2023-03-29 LAB — TOTAL PROTEIN, URINE DIPSTICK: Protein, ur: 100 mg/dL — AB

## 2023-03-29 MED ORDER — SODIUM CHLORIDE 0.9 % IV SOLN
400.0000 mg/m2 | Freq: Once | INTRAVENOUS | Status: AC
  Administered 2023-03-29: 740 mg via INTRAVENOUS
  Filled 2023-03-29: qty 25

## 2023-03-29 MED ORDER — ATROPINE SULFATE 1 MG/ML IV SOLN
0.5000 mg | Freq: Once | INTRAVENOUS | Status: AC | PRN
  Administered 2023-03-29: 0.5 mg via INTRAVENOUS
  Filled 2023-03-29: qty 1

## 2023-03-29 MED ORDER — SODIUM CHLORIDE 0.9 % IV SOLN
10.0000 mg | Freq: Once | INTRAVENOUS | Status: AC
  Administered 2023-03-29: 10 mg via INTRAVENOUS
  Filled 2023-03-29: qty 10

## 2023-03-29 MED ORDER — SODIUM CHLORIDE 0.9 % IV SOLN: Freq: Once | INTRAVENOUS | Status: AC

## 2023-03-29 MED ORDER — SODIUM CHLORIDE 0.9 % IV SOLN
140.0000 mg/m2 | Freq: Once | INTRAVENOUS | Status: AC
  Administered 2023-03-29: 260 mg via INTRAVENOUS
  Filled 2023-03-29: qty 13

## 2023-03-29 MED ORDER — DIPHENOXYLATE-ATROPINE 2.5-0.025 MG PO TABS
1.0000 | ORAL_TABLET | Freq: Four times a day (QID) | ORAL | 1 refills | Status: DC | PRN
Start: 2023-03-29 — End: 2023-04-12

## 2023-03-29 MED ORDER — PROCHLORPERAZINE MALEATE 10 MG PO TABS
10.0000 mg | ORAL_TABLET | Freq: Four times a day (QID) | ORAL | 1 refills | Status: DC | PRN
Start: 2023-03-29 — End: 2023-11-14

## 2023-03-29 MED ORDER — SODIUM CHLORIDE 0.9% FLUSH
10.0000 mL | Freq: Once | INTRAVENOUS | Status: AC
  Administered 2023-03-29: 10 mL

## 2023-03-29 MED ORDER — SODIUM CHLORIDE 0.9 % IV SOLN
5.0000 mg/kg | Freq: Once | INTRAVENOUS | Status: AC
  Administered 2023-03-29: 350 mg via INTRAVENOUS
  Filled 2023-03-29: qty 14

## 2023-03-29 MED ORDER — PALONOSETRON HCL INJECTION 0.25 MG/5ML
0.2500 mg | Freq: Once | INTRAVENOUS | Status: AC
  Administered 2023-03-29: 0.25 mg via INTRAVENOUS
  Filled 2023-03-29: qty 5

## 2023-03-29 MED ORDER — SODIUM CHLORIDE 0.9 % IV SOLN
2400.0000 mg/m2 | INTRAVENOUS | Status: DC
  Administered 2023-03-29: 4450 mg via INTRAVENOUS
  Filled 2023-03-29: qty 89

## 2023-03-29 MED ORDER — ONDANSETRON HCL 8 MG PO TABS
8.0000 mg | ORAL_TABLET | Freq: Three times a day (TID) | ORAL | 3 refills | Status: DC | PRN
Start: 2023-03-29 — End: 2024-07-10

## 2023-03-29 MED ORDER — OXYCODONE HCL 5 MG PO TABS
5.0000 mg | ORAL_TABLET | Freq: Three times a day (TID) | ORAL | 0 refills | Status: DC | PRN
Start: 2023-03-29 — End: 2023-04-12

## 2023-03-29 NOTE — Patient Instructions (Signed)
[place] [facility]  Discharge Instructions: Thank you for choosing [facility] to provide your oncology and hematology care.   If you have a lab appointment with the Cancer Center, please go directly to the Cancer Center and check in at the registration area.   Wear comfortable clothing and clothing appropriate for easy access to any Portacath or PICC line.   We strive to give you quality time with your provider. You may need to reschedule your appointment if you arrive late (15 or more minutes).  Arriving late affects you and other patients whose appointments are after yours.  Also, if you miss three or more appointments without notifying the office, you may be dismissed from the clinic at the provider's discretion.      For prescription refill requests, have your pharmacy contact our office and allow 72 hours for refills to be completed.    Today you received the following chemotherapy and/or immunotherapy agents: Bevacizumab, Irinotecan, and Fluorouracil       To help prevent nausea and vomiting after your treatment, we encourage you to take your nausea medication as directed.  BELOW ARE SYMPTOMS THAT SHOULD BE REPORTED IMMEDIATELY: *FEVER GREATER THAN 100.4 F (38 C) OR HIGHER *CHILLS OR SWEATING *NAUSEA AND VOMITING THAT IS NOT CONTROLLED WITH YOUR NAUSEA MEDICATION *UNUSUAL SHORTNESS OF BREATH *UNUSUAL BRUISING OR BLEEDING *URINARY PROBLEMS (pain or burning when urinating, or frequent urination) *BOWEL PROBLEMS (unusual diarrhea, constipation, pain near the anus) TENDERNESS IN MOUTH AND THROAT WITH OR WITHOUT PRESENCE OF ULCERS (sore throat, sores in mouth, or a toothache) UNUSUAL RASH, SWELLING OR PAIN  UNUSUAL VAGINAL DISCHARGE OR ITCHING   Items with * indicate a potential emergency and should be followed up as soon as possible or go to the Emergency Department if any problems should occur.  Please show the CHEMOTHERAPY ALERT CARD or  IMMUNOTHERAPY ALERT CARD at check-in to the Emergency Department and triage nurse.  Should you have questions after your visit or need to cancel or reschedule your appointment, please contact [place] [facility]  Dept: [phone]  and follow the prompts.  Office hours are 8:00 a.m. to 4:30 p.m. Monday - Friday. Please note that voicemails left after 4:00 p.m. may not be returned until the following business day.  We are closed weekends and major holidays. You have access to a nurse at all times for urgent questions. Please call the main number to the clinic Dept: [phone] and follow the prompts.   For any non-urgent questions, you may also contact your provider using MyChart. We now offer e-Visits for anyone 18 and older to request care online for non-urgent symptoms. For details visit [email or web address].   Also download the MyChart app! Go to the app store, search "MyChart", open the app, select [place], and log in with your MyChart username and password.   

## 2023-03-29 NOTE — Telephone Encounter (Signed)
From: Clearence Cheek To: [name], NP Sent: 03/29/2023 12:25 PM EDT Subject: Medication Renewal Request  Refills have been requested for the following medications:   oxyCODONE (OXY IR/ROXICODONE) 5 MG immediate release tablet Jake Samples Pickenpack-Cousar]  Preferred pharmacy: [id number] [place], [place] - [address] Delivery method: [employer]

## 2023-03-29 NOTE — Assessment & Plan Note (Signed)
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 He is on gabapentin and cymbalta, taking 2-3 oxycodone a day. He is followed by NP Nikki.    

## 2023-03-29 NOTE — Progress Notes (Signed)
[facility]   Telephone:(336) [phone] Fax:(336) [phone]   Clinic Follow up Note   Patient Care Team: Pcp, No as PCP - General Pickenpack-Cousar, Arty Baumgartner, NP as Nurse Practitioner (Nurse Practitioner) Malachy Mood, MD as Consulting Physician (Hematology)  Date of Service:  03/29/2023  CHIEF COMPLAINT: f/u of metastatic rectal cancer   CURRENT THERAPY:   Second-line FOLFIRI, q2weeks, started 02/22/22 -Bevacizumab started 11/24/21  ASSESSMENT:  Jorge [name] is a 72 y.o. 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 Staging MRI showed early extra mesorectal lymph node involvement.  -baseline CEA 571.84 on 09/06/21.  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 Xeloda dose reduced due to elevated liver enzymes.  -due to new liver lesions, we switched to second-line FOLFIRI, with continued beva, on 02/21/22. He tolerates well overall.  - his recent restaging CT scan from 11/21/2022 showed stable lung and liver metastasis, no new lesions.   -Will continue current chemotherapy.  We previously discussed maintenance irinotecan alone, since he is tolerating FOLFIRI well, will continue for now. - restaging CT scan on April 22 showed stable disease overall. -He is clinically doing well, diarrhea is well-controlled, no other significant side effect from treatment.  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 He is on gabapentin and cymbalta, taking 1oxycodone a day. He is followed by NP Lowella Bandy.       PLAN: -lab reviewed -proceed with FOLFIRI+ BEVA today -I refill Lomotil, Compazine, and Zofran  SUMMARY OF ONCOLOGIC  HISTORY: Oncology History Overview Note  Cancer Staging Rectal cancer [facility]) Staging form: Colon and Rectum, AJCC 8th Edition - Clinical stage from 08/29/2021: [name], cM1 - Signed by Malachy Mood, MD on 08/29/2021    Rectal cancer (HCC)  07/23/2021 Imaging   CT AP  IMPRESSION: 1. Appearance of the rectum likely indicates a rectal mass suspicious for rectal carcinoma. Inflammatory process would be less likely. Direct visualization is suggested. 2. Cholelithiasis without evidence of acute cholecystitis. 3. Aortic atherosclerosis.   08/19/2021 Procedure   Colonoscopy, under Dr. Meridee Score  Impression: - Rectal tenderness, palpable rectal mass and hemorrhoids found on digital rectal exam. - Stool in the entire examined colon - lavaged with still inadequate clearance. - One 35 mm polyp at the hepatic flexure. Biopsied. Tattooed distal in case future endoscopic resection is considered - however, has larger issues with rectal mass currently as below. - Four, 5 to 11 mm polyps in the transverse colon and at the hepatic flexure, removed with a cold snare. Resected and retrieved. - Diverticulosis in the recto-sigmoid colon and in the sigmoid colon. - Rule out malignancy, partially obstructing tumor in the rectum and from 6 to 13 cm proximal to the anus. Biopsied.   08/19/2021 Pathology Results   Diagnosis 1. Hepatic Flexure Biopsy - TUBULAR ADENOMA WITHOUT HIGH-GRADE DYSPLASIA OR MALIGNANCY 2. Transverse Colon Biopsy, and hepatic flexure, polyps (4) - TUBULAR ADENOMA WITHOUT HIGH-GRADE DYSPLASIA OR MALIGNANCY - OTHER FRAGMENTS OF POLYPOID COLONIC MUCOSA WITH NO SPECIFIC HISTOPATHOLOGIC CHANGES - FOOD MATERIAL 3. Rectum, biopsy - ADENOCARCINOMA. SEE NOTE   08/22/2021 Initial Diagnosis   Rectal cancer (HCC)   08/26/2021 Imaging   IMPRESSION: 1. A 2.2 x 1.9 cm right middle lobe pulmonary nodule as  well as a total of four right upper lobe pulmonary nodule and masses measuring up to 3.6  cm. Findings concerning for metastatic primary lung cancer versus less likely metastases in a patient with rectal cancer. Additional imaging evaluation or consultation with Pulmonology or Thoracic Surgery recommended. 2. No gross hilar adenopathy, noting limited sensitivity for the detection of hilar adenopathy on this noncontrast study. 3. Cholelithiasis. 4.  Emphysema (ICD10-J43.9). 5. At least left anterior descending coronary artery calcifications.   08/27/2021 Imaging   IMPRESSION: Rectal adenocarcinoma T stage: T4 B   Rectal adenocarcinoma N stage: N2 disease likely associated with early extra mesorectal lymph node involvement.   Distance from tumor to the internal anal sphincter is 1.2 cm.   Also with extramural venous involvement as described.   08/29/2021 Cancer Staging   Staging form: Colon and Rectum, AJCC 8th Edition - Clinical stage from 08/29/2021: [name], cM1 - Signed by Malachy Mood, MD on 08/29/2021 Stage prefix: Initial diagnosis   01/23/2022 Imaging   CT CAP w contrast IMPRESSION: 1. Mild interval decrease in size of multiple right-sided pulmonary nodules. No new suspicious pulmonary nodule or mass. 2. Interval development of approximately 10 new ill-defined hypoattenuating liver lesions ranging in size from approximately 5-10 mm. Imaging features suspicious for metastatic disease. MRI abdomen with and without contrast may prove helpful to further evaluate. 3. Similar appearance of wall thickening in the rectum with perirectal edema. 4. Cholelithiasis. 5. Prostatomegaly. 6. Aortic Atherosclerosis (ICD10-I70.0).   11/21/2022 Imaging    IMPRESSION: 1. Stable exam. No new or progressive interval findings. 2. The primary rectosigmoid lesion described previously is not well seen on the current study. There is presacral and perirectal edema, likely treatment related. 3. No substantial change in appearance of bilateral pulmonary metastases. 4. Stable tiny  hypodensities in both hepatic lobes. These were previously characterized as metastatic disease. No new liver lesions on today's exam. 5. Cholelithiasis. 6. 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 He was last seen by me on 03/15/2023. He presents to the clinic alone. Pt is clinically doing well. He denies having numbness and tingling in his hands and feet. He state that his Bm is tolerable with the help of the Imodium and Lomotil.        All other systems were reviewed with the patient and are negative.  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    SURGICAL HISTORY: Past Surgical History:  Procedure Laterality Date   BRONCHIAL BIOPSY  09/27/2021   Procedure: BRONCHIAL BIOPSIES;  Surgeon: Josephine Igo, DO;  Location: [facility];  Service: Pulmonary;;   BRONCHIAL BRUSHINGS  09/27/2021   Procedure: BRONCHIAL BRUSHINGS;  Surgeon: Josephine Igo, DO;  Location: [facility];  Service: Pulmonary;;   BRONCHIAL NEEDLE ASPIRATION BIOPSY  09/27/2021   Procedure:  BRONCHIAL NEEDLE ASPIRATION BIOPSIES;  Surgeon: Josephine Igo, DO;  Location: [facility];  Service: Pulmonary;;   COLONOSCOPY     IR IMAGING GUIDED PORT INSERTION  10/24/2021   left breast surgery Left 1968   VIDEO BRONCHOSCOPY WITH RADIAL  ENDOBRONCHIAL ULTRASOUND  09/27/2021   Procedure: RADIAL ENDOBRONCHIAL ULTRASOUND;  Surgeon: Josephine Igo, DO;  Location: [facility];  Service: Pulmonary;;    I have reviewed the social history and family history with the patient and they are unchanged from previous note.  ALLERGIES:  has No Known Allergies.  MEDICATIONS:  Current Outpatient Medications  Medication Sig Dispense Refill   amLODipine (NORVASC) 10 MG tablet Take 1 tablet (10 mg total) by mouth daily. 30 tablet 1   amLODipine (NORVASC) 5 MG tablet TAKE 1 TABLET (5 MG TOTAL) BY MOUTH DAILY. 30 tablet 2   carbonyl iron (FEOSOL) 45 MG TABS tablet Take 45 mg by mouth daily.     diphenoxylate-atropine (LOMOTIL) 2.5-0.025 MG tablet Take 1-2 tablets by mouth 4 (four) times daily as needed for diarrhea or loose stools. 90 tablet 1   DULoxetine (CYMBALTA) 20 MG capsule Take 1 capsule (20 mg total) by mouth daily. 30 capsule 1   gabapentin (NEURONTIN) 100 MG capsule Take 2 capsules (200 mg total) by mouth 3 (three) times daily. 180 capsule 1   Multiple Vitamin (MULTIVITAMIN WITH MINERALS) TABS tablet Take 1 tablet by mouth daily.     ondansetron (ZOFRAN) 8 MG tablet Take 1 tablet (8 mg total) by mouth every 8 (eight) hours as needed for nausea or vomiting. 30 tablet 3   oxyCODONE (OXY IR/ROXICODONE) 5 MG immediate release tablet Take 1 tablet (5 mg total) by mouth every 8 (eight) hours as needed for severe pain or breakthrough pain. 45 tablet 0   prochlorperazine (COMPAZINE) 10 MG tablet Take 1 tablet (10 mg total) by mouth every 6 (six) hours as needed (Nausea or vomiting). 30 tablet 1   No current facility-administered medications for this visit.   Facility-Administered Medications Ordered in Other Visits  Medication Dose Route Frequency Provider Last Rate Last Admin   0.9 %  sodium chloride infusion   Intravenous Once Malachy Mood, MD       bevacizumab-adcd [facility]) 350 mg in sodium chloride 0.9 % 100 mL chemo infusion  5 mg/kg  (Treatment Plan Recorded) Intravenous Once Malachy Mood, MD       dexamethasone (DECADRON) 10 mg in sodium chloride 0.9 % 50 mL IVPB  10 mg Intravenous Once Malachy Mood, MD       fluorouracil (ADRUCIL) 4,450 mg in sodium chloride 0.9 % 61 mL chemo infusion  2,400 mg/m2 (Treatment Plan Recorded) Intravenous 1 day or 1 dose Malachy Mood, MD       irinotecan (CAMPTOSAR) 260 mg in sodium chloride 0.9 % 500 mL chemo infusion  140 mg/m2 (Treatment Plan Recorded) Intravenous Once Malachy Mood, MD       leucovorin 740 mg in sodium chloride 0.9 % 250 mL infusion  400 mg/m2 (Treatment Plan Recorded) Intravenous Once Malachy Mood, MD       palonosetron (ALOXI) injection 0.25 mg  0.25 mg Intravenous Once Malachy Mood, MD        PHYSICAL EXAMINATION: ECOG PERFORMANCE STATUS: 1 - Symptomatic but completely ambulatory  Vitals:   03/29/23 0836  BP: (!) 155/84  Pulse: 72  Resp: 17  Temp: 97.8 F (36.6 C)  SpO2: 100%   Wt Readings from Last 3 Encounters:  03/29/23 142 lb  14.4 oz (64.8 kg)  03/15/23 143 lb 12.8 oz (65.2 kg)  02/22/23 143 lb 12.8 oz (65.2 kg)     GENERAL:alert, no distress and comfortable SKIN: skin color normal, no rashes or significant lesions EYES: normal, Conjunctiva are pink and non-injected, sclera clear  NEURO: alert & oriented x 3 with fluent speech  LABORATORY DATA:  I have reviewed the data as listed    Latest Ref Rng & Units 03/29/2023    7:52 AM 03/15/2023    8:07 AM 02/22/2023    7:49 AM  CBC  WBC 4.0 - 10.5 K/uL 6.5  6.3  3.1   Hemoglobin 13.0 - 17.0 g/dL 16.1  09.6  04.5   Hematocrit 39.0 - 52.0 % 39.8  36.9  38.7   Platelets 150 - 400 K/uL 209  299  146         Latest Ref Rng & Units 03/29/2023    7:52 AM 03/15/2023    8:07 AM 02/22/2023    7:49 AM  CMP  Glucose 70 - 99 mg/dL 409  811  914   BUN 8 - 23 mg/dL 7  7  10    Creatinine 0.61 - 1.24 mg/dL 7.82  9.56  2.13   Sodium 135 - 145 mmol/L 139  137  138   Potassium 3.5 - 5.1 mmol/L 4.0  3.8  4.1   Chloride 98 - 111 mmol/L  104  103  107   CO2 22 - 32 mmol/L 31  30  27    Calcium 8.9 - 10.3 mg/dL 8.6  8.3  8.5   Total Protein 6.5 - 8.1 g/dL 7.1  7.1  6.7   Total Bilirubin 0.3 - 1.2 mg/dL 0.4  0.3  0.4   Alkaline Phos 38 - 126 U/L 151  118  157   AST 15 - 41 U/L 20  16  23    ALT 0 - 44 U/L 13  13  15        RADIOGRAPHIC STUDIES: I have personally reviewed the radiological images as listed and agreed with the findings in the report. No results found.    Orders Placed This Encounter  Procedures   CBC with Differential (Cancer Center Only)    Standing Status:   Future    Standing Expiration Date:   05/09/2024   CMP (Cancer Center only)    Standing Status:   Future    Standing Expiration Date:   05/09/2024   Total Protein, Urine dipstick    Standing Status:   Future    Standing Expiration Date:   05/09/2024   CBC with Differential (Cancer Center Only)    Standing Status:   Future    Standing Expiration Date:   05/23/2024   CMP (Cancer Center only)    Standing Status:   Future    Standing Expiration Date:   05/23/2024   Total Protein, Urine dipstick    Standing Status:   Future    Standing Expiration Date:   05/23/2024   All questions were answered. The patient knows to call the clinic with any problems, questions or concerns. No barriers to learning was detected. The total time spent in the appointment was 25 minutes.     Malachy Mood, MD 03/29/2023   Carolin Coy, CMA, am acting as scribe for Malachy Mood, MD.   I have reviewed the above documentation for accuracy and completeness, and I agree with the above.

## 2023-03-29 NOTE — Assessment & Plan Note (Signed)
-  [id number] with lung metastasis, MMR normal, KRAS(+) [id number]  -diagnosed 08/2021 by colonoscopy for rectal bleeding and frequent BM. Staging MRI showed early extra mesorectal lymph node involvement.  -baseline CEA 571.84 on 09/06/21.  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 Xeloda dose reduced due to elevated liver enzymes.  -due to new liver lesions, we switched to second-line FOLFIRI, with continued beva, on 02/21/22. He tolerates well overall.  - his recent restaging CT scan from 11/21/2022 showed stable lung and liver metastasis, no new lesions.   -Will continue current chemotherapy.  We previously discussed maintenance irinotecan alone, since he is tolerating FOLFIRI well, will continue for now. - restaging CT scan on April 22 showed stable disease overall.

## 2023-03-31 ENCOUNTER — Inpatient Hospital Stay: Payer: Medicare Other

## 2023-03-31 VITALS — BP 140/78 | HR 78 | Temp 97.7°F | Resp 17

## 2023-03-31 DIAGNOSIS — Z5112 Encounter for antineoplastic immunotherapy: Secondary | ICD-10-CM | POA: Diagnosis not present

## 2023-03-31 DIAGNOSIS — C7800 Secondary malignant neoplasm of unspecified lung: Secondary | ICD-10-CM

## 2023-03-31 MED ORDER — SODIUM CHLORIDE 0.9% FLUSH
10.0000 mL | INTRAVENOUS | Status: DC | PRN
  Administered 2023-03-31: 10 mL

## 2023-03-31 MED ORDER — PEGFILGRASTIM-JMDB 6 MG/0.6ML ~~LOC~~ SOSY
6.0000 mg | PREFILLED_SYRINGE | Freq: Once | SUBCUTANEOUS | Status: AC
  Administered 2023-03-31: 6 mg via SUBCUTANEOUS

## 2023-03-31 MED ORDER — HEPARIN SOD (PORK) LOCK FLUSH 100 UNIT/ML IV SOLN
500.0000 [IU] | Freq: Once | INTRAVENOUS | Status: AC | PRN
  Administered 2023-03-31: 500 [IU]

## 2023-04-05 ENCOUNTER — Other Ambulatory Visit: Payer: Medicare Other

## 2023-04-05 ENCOUNTER — Ambulatory Visit: Payer: Medicare Other | Admitting: Hematology

## 2023-04-05 ENCOUNTER — Ambulatory Visit: Payer: Medicare Other

## 2023-04-10 NOTE — Progress Notes (Unsigned)
Palliative Medicine [name]  Telephone:(336) [phone] Fax:(336) [phone]   Name: Jorge [name] Date: 04/10/2023 MRN: [id number]  DOB: 1951-08-10  Jorge [name] Care Team: Pcp, No as PCP - General Pickenpack-Cousar, Arty Baumgartner, NP as Nurse Practitioner (Nurse Practitioner) Malachy Mood, MD as Consulting Physician (Hematology)   INTERVAL HISTORY: Jorge [name] is a 73 y.o. 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  Palliative ask to see for symptom management and goals of care.   SOCIAL HISTORY:     reports that he has been smoking cigarettes. He has a 5.00 pack-year smoking history. He has never used smokeless tobacco. He reports that he does not currently use alcohol. He reports that he does not use drugs.  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  MEDICATIONS:  Current Outpatient Medications  Medication Sig Dispense Refill   amLODipine (NORVASC) 10 MG tablet Take 1 tablet (10 mg total) by mouth daily. 30 tablet 1   amLODipine (NORVASC) 5 MG tablet TAKE 1 TABLET (5 MG TOTAL) BY MOUTH DAILY. 30 tablet 2   carbonyl iron (FEOSOL) 45 MG TABS tablet Take 45 mg by mouth daily.     diphenoxylate-atropine (LOMOTIL) 2.5-0.025 MG tablet Take 1-2 tablets by mouth 4 (four) times daily as needed for diarrhea or loose stools. 90 tablet 1   DULoxetine (CYMBALTA) 20 MG capsule Take 1 capsule (20 mg total) by mouth daily. 30 capsule 1   gabapentin (NEURONTIN) 100 MG capsule Take 2 capsules (200 mg total) by mouth 3 (three) times daily. 180 capsule 1   Multiple Vitamin (MULTIVITAMIN WITH MINERALS) TABS tablet Take 1 tablet by mouth daily.     ondansetron (ZOFRAN) 8 MG tablet Take 1 tablet (8 mg total) by mouth every 8 (eight)  hours as needed for nausea or vomiting. 30 tablet 3   oxyCODONE (OXY IR/ROXICODONE) 5 MG immediate release tablet Take 1 tablet (5 mg total) by mouth every 8 (eight) hours as needed for severe pain or breakthrough pain. 45 tablet 0   prochlorperazine (COMPAZINE) 10 MG tablet Take 1 tablet (10 mg total) by mouth every 6 (six) hours as needed (Nausea or vomiting). 30 tablet 1   No current facility-administered medications for this visit.    VITAL SIGNS: There were no vitals taken for this visit. There were no vitals filed for this visit.   Estimated body mass index is 19.38 kg/m as calculated from the following:   Height as of 03/15/23: 6' (1.829 m).   Weight as of 03/29/23: 142 lb 14.4 oz (64.8 kg).   PERFORMANCE STATUS (ECOG) : 1 - Symptomatic but completely ambulatory  Assessment NAD, ambulatory RRR Normal breathing pattern  AAO x3, mood appropriate    IMPRESSION: I saw Jorge [name] during his infusion. No acute distress. Resting comfortable. States his granddaughter's graduation party was last night and he did not go to bed until late. Denies nausea, vomiting, constipation. Occasional loose stools controlled with lomotil. Is remaining as active as possible. Looking forward to graduation on Saturday.   Neuropathic Pain  Jorge [name] reports his pain is well controlled on current regimen. Some days continue to be better than others. He is not taking Cymbalta. Advised he will need to continue with his  gabapentin and oxycodone to manage his neuropathic pain.    No changes to current regimen.   Will continue to closely monitor and support as needed.   PLAN:  Gabapentin 300mg  three times daily.   Oxy IR 5mg  every 8 hours as needed. Not requiring around the clock.  I will plan to see Jorge [name] back in 3-4 weeks in collaboration with his oncology appointments.   Jorge [name] expressed understanding and was in agreement with this plan. He also understands that He can call the clinic at any time  with any questions, concerns, or complaints.     Any controlled substances utilized were prescribed in the context of palliative care. PDMP has been reviewed.    Visit consisted of counseling and education dealing with the complex and emotionally intense issues of symptom management and palliative care in the setting of serious and potentially life-threatening illness.Greater than 50%  of this time was spent counseling and coordinating care related to the above assessment and plan.  Willette Alma, AGPCNP-BC  Palliative Medicine Team/[name]  *Please note that this is a verbal dictation therefore any spelling or grammatical errors are due to the "[employer] One" system interpretation.

## 2023-04-11 MED FILL — Dexamethasone Sodium Phosphate Inj 100 MG/10ML: INTRAMUSCULAR | Qty: 1 | Status: AC

## 2023-04-12 ENCOUNTER — Inpatient Hospital Stay (HOSPITAL_BASED_OUTPATIENT_CLINIC_OR_DEPARTMENT_OTHER): Payer: Medicare Other | Admitting: Nurse Practitioner

## 2023-04-12 ENCOUNTER — Encounter: Payer: Self-pay | Admitting: Hematology

## 2023-04-12 ENCOUNTER — Inpatient Hospital Stay (HOSPITAL_BASED_OUTPATIENT_CLINIC_OR_DEPARTMENT_OTHER): Payer: Medicare Other | Admitting: Hematology

## 2023-04-12 ENCOUNTER — Encounter: Payer: Self-pay | Admitting: Nurse Practitioner

## 2023-04-12 ENCOUNTER — Inpatient Hospital Stay: Payer: Medicare Other | Attending: Physician Assistant

## 2023-04-12 ENCOUNTER — Other Ambulatory Visit: Payer: Self-pay

## 2023-04-12 ENCOUNTER — Inpatient Hospital Stay: Payer: Medicare Other

## 2023-04-12 ENCOUNTER — Other Ambulatory Visit: Payer: Self-pay | Admitting: Hematology

## 2023-04-12 VITALS — BP 141/88 | HR 75 | Temp 97.5°F | Resp 18 | Ht 72.0 in | Wt 143.6 lb

## 2023-04-12 DIAGNOSIS — R530 Neoplastic (malignant) related fatigue: Secondary | ICD-10-CM

## 2023-04-12 DIAGNOSIS — Z5112 Encounter for antineoplastic immunotherapy: Secondary | ICD-10-CM | POA: Diagnosis present

## 2023-04-12 DIAGNOSIS — C20 Malignant neoplasm of rectum: Secondary | ICD-10-CM

## 2023-04-12 DIAGNOSIS — M792 Neuralgia and neuritis, unspecified: Secondary | ICD-10-CM

## 2023-04-12 DIAGNOSIS — C7800 Secondary malignant neoplasm of unspecified lung: Secondary | ICD-10-CM | POA: Insufficient documentation

## 2023-04-12 DIAGNOSIS — G893 Neoplasm related pain (acute) (chronic): Secondary | ICD-10-CM | POA: Diagnosis not present

## 2023-04-12 DIAGNOSIS — Z515 Encounter for palliative care: Secondary | ICD-10-CM | POA: Diagnosis not present

## 2023-04-12 DIAGNOSIS — G620 Drug-induced polyneuropathy: Secondary | ICD-10-CM

## 2023-04-12 DIAGNOSIS — Z5189 Encounter for other specified aftercare: Secondary | ICD-10-CM | POA: Diagnosis not present

## 2023-04-12 DIAGNOSIS — T451X5A Adverse effect of antineoplastic and immunosuppressive drugs, initial encounter: Secondary | ICD-10-CM

## 2023-04-12 DIAGNOSIS — C775 Secondary and unspecified malignant neoplasm of intrapelvic lymph nodes: Secondary | ICD-10-CM | POA: Diagnosis not present

## 2023-04-12 DIAGNOSIS — F17210 Nicotine dependence, cigarettes, uncomplicated: Secondary | ICD-10-CM | POA: Diagnosis not present

## 2023-04-12 DIAGNOSIS — R197 Diarrhea, unspecified: Secondary | ICD-10-CM | POA: Diagnosis not present

## 2023-04-12 DIAGNOSIS — Z5111 Encounter for antineoplastic chemotherapy: Secondary | ICD-10-CM | POA: Insufficient documentation

## 2023-04-12 DIAGNOSIS — C787 Secondary malignant neoplasm of liver and intrahepatic bile duct: Secondary | ICD-10-CM | POA: Diagnosis not present

## 2023-04-12 DIAGNOSIS — Z95828 Presence of other vascular implants and grafts: Secondary | ICD-10-CM

## 2023-04-12 LAB — CBC WITH DIFFERENTIAL (CANCER CENTER ONLY)
Abs Immature Granulocytes: 0.06 10*3/uL (ref 0.00–0.07)
Basophils Absolute: 0 10*3/uL (ref 0.0–0.1)
Basophils Relative: 0 %
Eosinophils Absolute: 0.3 10*3/uL (ref 0.0–0.5)
Eosinophils Relative: 3 %
HCT: 38.7 % — ABNORMAL LOW (ref 39.0–52.0)
Hemoglobin: 12.2 g/dL — ABNORMAL LOW (ref 13.0–17.0)
Immature Granulocytes: 1 %
Lymphocytes Relative: 8 %
Lymphs Abs: 0.7 10*3/uL (ref 0.7–4.0)
MCH: 28 pg (ref 26.0–34.0)
MCHC: 31.5 g/dL (ref 30.0–36.0)
MCV: 88.8 fL (ref 80.0–100.0)
Monocytes Absolute: 1.2 10*3/uL — ABNORMAL HIGH (ref 0.1–1.0)
Monocytes Relative: 13 %
Neutro Abs: 7.1 10*3/uL (ref 1.7–7.7)
Neutrophils Relative %: 75 %
Platelet Count: 227 10*3/uL (ref 150–400)
RBC: 4.36 MIL/uL (ref 4.22–5.81)
RDW: 17.1 % — ABNORMAL HIGH (ref 11.5–15.5)
WBC Count: 9.3 10*3/uL (ref 4.0–10.5)
nRBC: 0 % (ref 0.0–0.2)

## 2023-04-12 LAB — CMP (CANCER CENTER ONLY)
ALT: 13 U/L (ref 0–44)
AST: 16 U/L (ref 15–41)
Albumin: 3.2 g/dL — ABNORMAL LOW (ref 3.5–5.0)
Alkaline Phosphatase: 152 U/L — ABNORMAL HIGH (ref 38–126)
Anion gap: 4 — ABNORMAL LOW (ref 5–15)
BUN: 8 mg/dL (ref 8–23)
CO2: 30 mmol/L (ref 22–32)
Calcium: 8.5 mg/dL — ABNORMAL LOW (ref 8.9–10.3)
Chloride: 103 mmol/L (ref 98–111)
Creatinine: 0.73 mg/dL (ref 0.61–1.24)
GFR, Estimated: >60 mL/min (ref >60)
Glucose, Bld: 173 mg/dL — ABNORMAL HIGH (ref 70–99)
Potassium: 3.9 mmol/L (ref 3.5–5.1)
Sodium: 137 mmol/L (ref 135–145)
Total Bilirubin: 0.3 mg/dL (ref 0.3–1.2)
Total Protein: 6.9 g/dL (ref 6.5–8.1)

## 2023-04-12 LAB — TOTAL PROTEIN, URINE DIPSTICK: Protein, ur: 30 mg/dL — AB

## 2023-04-12 MED ORDER — SODIUM CHLORIDE 0.9 % IV SOLN
140.0000 mg/m2 | Freq: Once | INTRAVENOUS | Status: AC
  Administered 2023-04-12: 260 mg via INTRAVENOUS
  Filled 2023-04-12: qty 13

## 2023-04-12 MED ORDER — DIPHENOXYLATE-ATROPINE 2.5-0.025 MG PO TABS
1.0000 | ORAL_TABLET | Freq: Four times a day (QID) | ORAL | 1 refills | Status: DC | PRN
Start: 2023-04-12 — End: 2023-05-23

## 2023-04-12 MED ORDER — SODIUM CHLORIDE 0.9 % IV SOLN
10.0000 mg | Freq: Once | INTRAVENOUS | Status: AC
  Administered 2023-04-12: 10 mg via INTRAVENOUS
  Filled 2023-04-12: qty 10

## 2023-04-12 MED ORDER — OXYCODONE HCL 5 MG PO TABS
5.0000 mg | ORAL_TABLET | Freq: Three times a day (TID) | ORAL | 0 refills | Status: DC | PRN
Start: 2023-04-12 — End: 2023-04-26

## 2023-04-12 MED ORDER — SODIUM CHLORIDE 0.9 % IV SOLN
5.0000 mg/kg | Freq: Once | INTRAVENOUS | Status: AC
  Administered 2023-04-12: 350 mg via INTRAVENOUS
  Filled 2023-04-12: qty 14

## 2023-04-12 MED ORDER — SODIUM CHLORIDE 0.9 % IV SOLN
2400.0000 mg/m2 | INTRAVENOUS | Status: DC
  Administered 2023-04-12: 4450 mg via INTRAVENOUS
  Filled 2023-04-12: qty 89

## 2023-04-12 MED ORDER — SODIUM CHLORIDE 0.9 % IV SOLN
400.0000 mg/m2 | Freq: Once | INTRAVENOUS | Status: AC
  Administered 2023-04-12: 740 mg via INTRAVENOUS
  Filled 2023-04-12: qty 17.5

## 2023-04-12 MED ORDER — PALONOSETRON HCL INJECTION 0.25 MG/5ML
0.2500 mg | Freq: Once | INTRAVENOUS | Status: AC
  Administered 2023-04-12: 0.25 mg via INTRAVENOUS
  Filled 2023-04-12: qty 5

## 2023-04-12 MED ORDER — SODIUM CHLORIDE 0.9% FLUSH
10.0000 mL | Freq: Once | INTRAVENOUS | Status: AC
  Administered 2023-04-12: 10 mL

## 2023-04-12 MED ORDER — ATROPINE SULFATE 1 MG/ML IV SOLN
0.5000 mg | Freq: Once | INTRAVENOUS | Status: AC | PRN
  Administered 2023-04-12: 0.5 mg via INTRAVENOUS
  Filled 2023-04-12: qty 1

## 2023-04-12 MED ORDER — SODIUM CHLORIDE 0.9 % IV SOLN: Freq: Once | INTRAVENOUS | Status: AC

## 2023-04-12 MED ORDER — GABAPENTIN 100 MG PO CAPS
200.0000 mg | ORAL_CAPSULE | Freq: Three times a day (TID) | ORAL | 1 refills | Status: DC
Start: 2023-04-12 — End: 2023-08-02

## 2023-04-12 NOTE — Patient Instructions (Signed)
[facility]  Discharge Instructions: Thank you for choosing [facility] to provide your oncology and hematology care.   If you have a lab appointment with the Cancer Center, please go directly to the Cancer Center and check in at the registration area.   Wear comfortable clothing and clothing appropriate for easy access to any Portacath or PICC line.   We strive to give you quality time with your provider. You may need to reschedule your appointment if you arrive late (15 or more minutes).  Arriving late affects you and other patients whose appointments are after yours.  Also, if you miss three or more appointments without notifying the office, you may be dismissed from the clinic at the provider's discretion.      For prescription refill requests, have your pharmacy contact our office and allow 72 hours for refills to be completed.    Today you received the following chemotherapy and/or immunotherapy agents avastin, leucovorin, irinotecan, fluorourcil      To help prevent nausea and vomiting after your treatment, we encourage you to take your nausea medication as directed.  BELOW ARE SYMPTOMS THAT SHOULD BE REPORTED IMMEDIATELY: *FEVER GREATER THAN 100.4 F (38 C) OR HIGHER *CHILLS OR SWEATING *NAUSEA AND VOMITING THAT IS NOT CONTROLLED WITH YOUR NAUSEA MEDICATION *UNUSUAL SHORTNESS OF BREATH *UNUSUAL BRUISING OR BLEEDING *URINARY PROBLEMS (pain or burning when urinating, or frequent urination) *BOWEL PROBLEMS (unusual diarrhea, constipation, pain near the anus) TENDERNESS IN MOUTH AND THROAT WITH OR WITHOUT PRESENCE OF ULCERS (sore throat, sores in mouth, or a toothache) UNUSUAL RASH, SWELLING OR PAIN  UNUSUAL VAGINAL DISCHARGE OR ITCHING   Items with * indicate a potential emergency and should be followed up as soon as possible or go to the Emergency Department if any problems should occur.  Please show the CHEMOTHERAPY ALERT CARD or  IMMUNOTHERAPY ALERT CARD at check-in to the Emergency Department and triage nurse.  Should you have questions after your visit or need to cancel or reschedule your appointment, please contact [facility]  Dept: [phone]  and follow the prompts.  Office hours are 8:00 a.m. to 4:30 p.m. Monday - Friday. Please note that voicemails left after 4:00 p.m. may not be returned until the following business day.  We are closed weekends and major holidays. You have access to a nurse at all times for urgent questions. Please call the main number to the clinic Dept: [phone] and follow the prompts.   For any non-urgent questions, you may also contact your provider using MyChart. We now offer e-Visits for anyone 55 and older to request care online for non-urgent symptoms. For details visit [email or web address].   Also download the MyChart app! Go to the app store, search "MyChart", open the app, select [facility], and log in with your MyChart username and password.

## 2023-04-12 NOTE — Progress Notes (Signed)
[facility]   [phone]) [phone] Fax:(336) [phone]   Clinic Follow up Note   Patient Care Team: Pcp, No as PCP - General Pickenpack-Cousar, Arty Baumgartner, NP as Nurse Practitioner (Nurse Practitioner) Malachy Mood, MD as Consulting Physician (Hematology)  Date of Service:  04/12/2023  CHIEF COMPLAINT: f/u of metastatic rectal cancer   CURRENT THERAPY:  Second-line FOLFIRI, q2weeks, started 02/22/22 -Bevacizumab started 11/24/21  ASSESSMENT:  Jorge [name] is a 72 y.o. male with   Rectal cancer (HCC) -[id number] with lung metastasis, MMR normal, KRAS(+) [id number]  -diagnosed 08/2021 by colonoscopy for rectal bleeding and frequent BM. Staging MRI showed early extra mesorectal lymph node involvement.  -baseline CEA 571.84 on 09/06/21.  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 Xeloda dose reduced due to elevated liver enzymes.  -due to new liver lesions, we switched to second-line FOLFIRI, with continued beva, on 02/21/22. He tolerates well overall.  - his recent restaging CT scan from 11/21/2022 showed stable lung and liver metastasis, no new lesions.   -Will continue current chemotherapy.  We previously discussed maintenance irinotecan alone, since he is tolerating FOLFIRI well, will continue for now. - restaging CT scan on April 22 showed stable disease overall. -He is clinically doing well, diarrhea is well-controlled, no other significant side effect from treatment.  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 He is on gabapentin and cymbalta, taking 1oxycodone a day. He is followed by NP Lowella Bandy.        PLAN: -lab reviewed -I refill Lomotil -proceed with FOLFIRI+ BEVA today labs are adequate for treatment. -lab/flush and treatment  6/20   SUMMARY OF ONCOLOGIC HISTORY: Oncology History Overview Note  Cancer Staging Rectal cancer [facility]) Staging form: Colon and Rectum, AJCC 8th Edition - Clinical stage from 08/29/2021: Jorge [name], cM1 - Signed by Malachy Mood, MD on 08/29/2021    Rectal cancer (HCC)  07/23/2021 Imaging   CT AP  IMPRESSION: 1. Appearance of the rectum likely indicates a rectal mass suspicious for rectal carcinoma. Inflammatory process would be less likely. Direct visualization is suggested. 2. Cholelithiasis without evidence of acute cholecystitis. 3. Aortic atherosclerosis.   08/19/2021 Procedure   Colonoscopy, under Dr. Meridee Score  Impression: - Rectal tenderness, palpable rectal mass and hemorrhoids found on digital rectal exam. - Stool in the entire examined colon - lavaged with still inadequate clearance. - One 35 mm polyp at the hepatic flexure. Biopsied. Tattooed distal in case future endoscopic resection is considered - however, has larger issues with rectal mass currently as below. - Four, 5 to 11 mm polyps in the transverse colon and at the hepatic flexure, removed with a cold snare. Resected and retrieved. - Diverticulosis in the recto-sigmoid colon and in the sigmoid colon. - Rule out malignancy, partially obstructing tumor in the rectum and from 6 to 13 cm proximal to the anus. Biopsied.   08/19/2021 Pathology Results   Diagnosis 1. Hepatic Flexure Biopsy - TUBULAR ADENOMA WITHOUT HIGH-GRADE DYSPLASIA OR MALIGNANCY 2. Transverse Colon Biopsy, and hepatic flexure, polyps (4) - TUBULAR ADENOMA WITHOUT HIGH-GRADE DYSPLASIA OR MALIGNANCY - OTHER FRAGMENTS OF POLYPOID COLONIC MUCOSA WITH NO SPECIFIC HISTOPATHOLOGIC CHANGES - FOOD MATERIAL 3. Rectum, biopsy - ADENOCARCINOMA. SEE NOTE   08/22/2021 Initial Diagnosis   Rectal cancer (HCC)   08/26/2021 Imaging   IMPRESSION: 1. A 2.2 x 1.9  cm right middle lobe pulmonary nodule as well as a total of four right upper lobe pulmonary nodule  and masses measuring up to 3.6 cm. Findings concerning for metastatic primary lung cancer versus less likely metastases in a patient with rectal cancer. Additional imaging evaluation or consultation with Pulmonology or Thoracic Surgery recommended. 2. No gross hilar adenopathy, noting limited sensitivity for the detection of hilar adenopathy on this noncontrast study. 3. Cholelithiasis. 4.  Emphysema (ICD10-J43.9). 5. At least left anterior descending coronary artery calcifications.   08/27/2021 Imaging   IMPRESSION: Rectal adenocarcinoma T stage: T4 B   Rectal adenocarcinoma N stage: N2 disease likely associated with early extra mesorectal lymph node involvement.   Distance from tumor to the internal anal sphincter is 1.2 cm.   Also with extramural venous involvement as described.   08/29/2021 Cancer Staging   Staging form: Colon and Rectum, AJCC 8th Edition - Clinical stage from 08/29/2021: Jorge [name], cM1 - Signed by Malachy Mood, MD on 08/29/2021 Stage prefix: Initial diagnosis   01/23/2022 Imaging   CT CAP w contrast IMPRESSION: 1. Mild interval decrease in size of multiple right-sided pulmonary nodules. No new suspicious pulmonary nodule or mass. 2. Interval development of approximately 10 new ill-defined hypoattenuating liver lesions ranging in size from approximately 5-10 mm. Imaging features suspicious for metastatic disease. MRI abdomen with and without contrast may prove helpful to further evaluate. 3. Similar appearance of wall thickening in the rectum with perirectal edema. 4. Cholelithiasis. 5. Prostatomegaly. 6. Aortic Atherosclerosis (ICD10-I70.0).   11/21/2022 Imaging    IMPRESSION: 1. Stable exam. No new or progressive interval findings. 2. The primary rectosigmoid lesion described previously is not well seen on the current study. There is presacral and perirectal edema, likely treatment related. 3. No substantial change in appearance of bilateral  pulmonary metastases. 4. Stable tiny hypodensities in both hepatic lobes. These were previously characterized as metastatic disease. No new liver lesions on today's exam. 5. Cholelithiasis. 6. 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 He was last seen by me on 03/29/2023. He presents to the clinic alone. Pt state that he is lactose intolerant, cause when he drinks milk he experience some diarrhea. Pt state that his neuropathy is tolerable and he take the gabapentin for pain.   All other systems were reviewed with the patient and are negative.  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    SURGICAL HISTORY: Past Surgical History:  Procedure Laterality Date   BRONCHIAL BIOPSY  09/27/2021   Procedure: BRONCHIAL BIOPSIES;  Surgeon: Josephine Igo, DO;  Location: [facility];  Service: Pulmonary;;   BRONCHIAL BRUSHINGS  09/27/2021   Procedure: BRONCHIAL BRUSHINGS;  Surgeon: Josephine Igo, DO;  Location: [facility];  Service: Pulmonary;;   BRONCHIAL NEEDLE ASPIRATION BIOPSY  09/27/2021  Procedure: BRONCHIAL NEEDLE ASPIRATION BIOPSIES;  Surgeon: Josephine Igo, DO;  Location: [facility];  Service: Pulmonary;;   COLONOSCOPY     IR IMAGING GUIDED PORT INSERTION  10/24/2021   left breast surgery Left 1968    VIDEO BRONCHOSCOPY WITH RADIAL ENDOBRONCHIAL ULTRASOUND  09/27/2021   Procedure: RADIAL ENDOBRONCHIAL ULTRASOUND;  Surgeon: Josephine Igo, DO;  Location: [facility];  Service: Pulmonary;;    I have reviewed the social history and family history with the patient and they are unchanged from previous note.  ALLERGIES:  has No Known Allergies.  MEDICATIONS:  Current Outpatient Medications  Medication Sig Dispense Refill   amLODipine (NORVASC) 10 MG tablet TAKE 1 TABLET BY MOUTH EVERY DAY 30 tablet 1   amLODipine (NORVASC) 5 MG tablet TAKE 1 TABLET (5 MG TOTAL) BY MOUTH DAILY. 30 tablet 2   carbonyl iron (FEOSOL) 45 MG TABS tablet Take 45 mg by mouth daily.     diphenoxylate-atropine (LOMOTIL) 2.5-0.025 MG tablet Take 1-2 tablets by mouth 4 (four) times daily as needed for diarrhea or loose stools. 120 tablet 1   DULoxetine (CYMBALTA) 20 MG capsule Take 1 capsule (20 mg total) by mouth daily. 30 capsule 1   gabapentin (NEURONTIN) 100 MG capsule Take 2 capsules (200 mg total) by mouth 3 (three) times daily. 180 capsule 1   Multiple Vitamin (MULTIVITAMIN WITH MINERALS) TABS tablet Take 1 tablet by mouth daily.     ondansetron (ZOFRAN) 8 MG tablet Take 1 tablet (8 mg total) by mouth every 8 (eight) hours as needed for nausea or vomiting. 30 tablet 3   oxyCODONE (OXY IR/ROXICODONE) 5 MG immediate release tablet Take 1 tablet (5 mg total) by mouth every 8 (eight) hours as needed for severe pain or breakthrough pain. 45 tablet 0   prochlorperazine (COMPAZINE) 10 MG tablet Take 1 tablet (10 mg total) by mouth every 6 (six) hours as needed (Nausea or vomiting). 30 tablet 1   No current facility-administered medications for this visit.   Facility-Administered Medications Ordered in Other Visits  Medication Dose Route Frequency Provider Last Rate Last Admin   fluorouracil (ADRUCIL) 4,450 mg in sodium chloride 0.9 % 61 mL chemo infusion  2,400 mg/m2 (Treatment Plan Recorded) Intravenous 1 day or 1  dose Malachy Mood, MD       irinotecan (CAMPTOSAR) 260 mg in sodium chloride 0.9 % 500 mL chemo infusion  140 mg/m2 (Treatment Plan Recorded) Intravenous Once Malachy Mood, MD 342 mL/hr at 04/12/23 1046 260 mg at 04/12/23 1046   leucovorin 740 mg in sodium chloride 0.9 % 250 mL infusion  400 mg/m2 (Treatment Plan Recorded) Intravenous Once Malachy Mood, MD 191 mL/hr at 04/12/23 1042 740 mg at 04/12/23 1042    PHYSICAL EXAMINATION: ECOG PERFORMANCE STATUS: 1 - Symptomatic but completely ambulatory  Vitals:   04/12/23 0816  BP: (!) 141/88  Pulse: 75  Resp: 18  Temp: (!) 97.5 F (36.4 C)  SpO2: 98%   Wt Readings from Last 3 Encounters:  04/12/23 143 lb 9.6 oz (65.1 kg)  03/29/23 142 lb 14.4 oz (64.8 kg)  03/15/23 143 lb 12.8 oz (65.2 kg)     GENERAL:alert, no distress and comfortable SKIN: skin color normal, no rashes or significant lesions EYES: normal, Conjunctiva are pink and non-injected, sclera clear  NEURO: alert & oriented x 3 with fluent speech  LABORATORY DATA:  I have reviewed the data as listed    Latest Ref Rng & Units 04/12/2023    7:50 AM  03/29/2023    7:52 AM 03/15/2023    8:07 AM  CBC  WBC 4.0 - 10.5 K/uL 9.3  6.5  6.3   Hemoglobin 13.0 - 17.0 g/dL 16.1  09.6  04.5   Hematocrit 39.0 - 52.0 % 38.7  39.8  36.9   Platelets 150 - 400 K/uL 227  209  299         Latest Ref Rng & Units 04/12/2023    7:50 AM 03/29/2023    7:52 AM 03/15/2023    8:07 AM  CMP  Glucose 70 - 99 mg/dL 409  811  914   BUN 8 - 23 mg/dL 8  7  7    Creatinine 0.61 - 1.24 mg/dL 7.82  9.56  2.13   Sodium 135 - 145 mmol/L 137  139  137   Potassium 3.5 - 5.1 mmol/L 3.9  4.0  3.8   Chloride 98 - 111 mmol/L 103  104  103   CO2 22 - 32 mmol/L 30  31  30    Calcium 8.9 - 10.3 mg/dL 8.5  8.6  8.3   Total Protein 6.5 - 8.1 g/dL 6.9  7.1  7.1   Total Bilirubin 0.3 - 1.2 mg/dL 0.3  0.4  0.3   Alkaline Phos 38 - 126 U/L 152  151  118   AST 15 - 41 U/L 16  20  16    ALT 0 - 44 U/L 13  13  13         RADIOGRAPHIC STUDIES: I have personally reviewed the radiological images as listed and agreed with the findings in the report. No results found.    No orders of the defined types were placed in this encounter.  All questions were answered. The patient knows to call the clinic with any problems, questions or concerns. No barriers to learning was detected. The total time spent in the appointment was 25 minutes.     Malachy Mood, MD 04/12/2023   Carolin Coy, CMA, am acting as scribe for Malachy Mood, MD.   I have reviewed the above documentation for accuracy and completeness, and I agree with the above.

## 2023-04-12 NOTE — Assessment & Plan Note (Signed)
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 He is on gabapentin and cymbalta, taking 1oxycodone a day. He is followed by NP Lowella Bandy.

## 2023-04-12 NOTE — Telephone Encounter (Signed)
From: Clearence Cheek To: [name], NP Sent: 04/12/2023 11:56 AM EDT Subject: Medication Renewal Request  Refills have been requested for the following medications:   oxyCODONE (OXY IR/ROXICODONE) 5 MG immediate release tablet Jake Samples Pickenpack-Cousar]  Preferred pharmacy: [id number] [place], [place] - [address] Delivery method: [employer]

## 2023-04-12 NOTE — Assessment & Plan Note (Signed)
-  [id number] with lung metastasis, MMR normal, KRAS(+) [id number]  -diagnosed 08/2021 by colonoscopy for rectal bleeding and frequent BM. Staging MRI showed early extra mesorectal lymph node involvement.  -baseline CEA 571.84 on 09/06/21.  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 Xeloda dose reduced due to elevated liver enzymes.  -due to new liver lesions, we switched to second-line FOLFIRI, with continued beva, on 02/21/22. He tolerates well overall.  - his recent restaging CT scan from 11/21/2022 showed stable lung and liver metastasis, no new lesions.   -Will continue current chemotherapy.  We previously discussed maintenance irinotecan alone, since he is tolerating FOLFIRI well, will continue for now. - restaging CT scan on April 22 showed stable disease overall. -He is clinically doing well, diarrhea is well-controlled, no other significant side effect from treatment.  Lab reviewed, adequate for treatment, will continue

## 2023-04-14 ENCOUNTER — Inpatient Hospital Stay: Payer: Medicare Other

## 2023-04-14 VITALS — BP 112/72 | HR 84 | Temp 98.3°F | Resp 18

## 2023-04-14 DIAGNOSIS — Z5112 Encounter for antineoplastic immunotherapy: Secondary | ICD-10-CM | POA: Diagnosis not present

## 2023-04-14 DIAGNOSIS — C7800 Secondary malignant neoplasm of unspecified lung: Secondary | ICD-10-CM

## 2023-04-14 DIAGNOSIS — Z95828 Presence of other vascular implants and grafts: Secondary | ICD-10-CM

## 2023-04-14 MED ORDER — PEGFILGRASTIM-JMDB 6 MG/0.6ML ~~LOC~~ SOSY
6.0000 mg | PREFILLED_SYRINGE | Freq: Once | SUBCUTANEOUS | Status: AC
  Administered 2023-04-14: 6 mg via SUBCUTANEOUS
  Filled 2023-04-14: qty 0.6

## 2023-04-14 MED ORDER — SODIUM CHLORIDE 0.9% FLUSH
10.0000 mL | Freq: Once | INTRAVENOUS | Status: AC
  Administered 2023-04-14: 10 mL

## 2023-04-14 MED ORDER — HEPARIN SOD (PORK) LOCK FLUSH 100 UNIT/ML IV SOLN
500.0000 [IU] | Freq: Once | INTRAVENOUS | Status: AC
  Administered 2023-04-14: 500 [IU]

## 2023-04-25 MED FILL — Dexamethasone Sodium Phosphate Inj 100 MG/10ML: INTRAMUSCULAR | Qty: 1 | Status: AC

## 2023-04-26 ENCOUNTER — Inpatient Hospital Stay: Payer: Medicare Other

## 2023-04-26 ENCOUNTER — Other Ambulatory Visit: Payer: Self-pay

## 2023-04-26 ENCOUNTER — Inpatient Hospital Stay (HOSPITAL_BASED_OUTPATIENT_CLINIC_OR_DEPARTMENT_OTHER): Payer: Medicare Other | Admitting: Hematology

## 2023-04-26 ENCOUNTER — Encounter: Payer: Self-pay | Admitting: Hematology

## 2023-04-26 VITALS — BP 149/64 | HR 77 | Temp 98.5°F | Resp 18 | Ht 72.0 in | Wt 143.9 lb

## 2023-04-26 DIAGNOSIS — C7800 Secondary malignant neoplasm of unspecified lung: Secondary | ICD-10-CM

## 2023-04-26 DIAGNOSIS — Z515 Encounter for palliative care: Secondary | ICD-10-CM

## 2023-04-26 DIAGNOSIS — C20 Malignant neoplasm of rectum: Secondary | ICD-10-CM | POA: Diagnosis not present

## 2023-04-26 DIAGNOSIS — D500 Iron deficiency anemia secondary to blood loss (chronic): Secondary | ICD-10-CM

## 2023-04-26 DIAGNOSIS — G893 Neoplasm related pain (acute) (chronic): Secondary | ICD-10-CM

## 2023-04-26 DIAGNOSIS — G620 Drug-induced polyneuropathy: Secondary | ICD-10-CM | POA: Diagnosis not present

## 2023-04-26 DIAGNOSIS — T451X5A Adverse effect of antineoplastic and immunosuppressive drugs, initial encounter: Secondary | ICD-10-CM | POA: Diagnosis not present

## 2023-04-26 DIAGNOSIS — Z95828 Presence of other vascular implants and grafts: Secondary | ICD-10-CM

## 2023-04-26 DIAGNOSIS — Z5112 Encounter for antineoplastic immunotherapy: Secondary | ICD-10-CM | POA: Diagnosis not present

## 2023-04-26 LAB — CMP (CANCER CENTER ONLY)
ALT: 13 U/L (ref 0–44)
AST: 17 U/L (ref 15–41)
Albumin: 2.8 g/dL — ABNORMAL LOW (ref 3.5–5.0)
Alkaline Phosphatase: 122 U/L (ref 38–126)
Anion gap: 5 (ref 5–15)
BUN: 7 mg/dL — ABNORMAL LOW (ref 8–23)
CO2: 29 mmol/L (ref 22–32)
Calcium: 8.4 mg/dL — ABNORMAL LOW (ref 8.9–10.3)
Chloride: 105 mmol/L (ref 98–111)
Creatinine: 0.64 mg/dL (ref 0.61–1.24)
GFR, Estimated: >60 mL/min (ref >60)
Glucose, Bld: 133 mg/dL — ABNORMAL HIGH (ref 70–99)
Potassium: 3.8 mmol/L (ref 3.5–5.1)
Sodium: 139 mmol/L (ref 135–145)
Total Bilirubin: 0.4 mg/dL (ref 0.3–1.2)
Total Protein: 6.2 g/dL — ABNORMAL LOW (ref 6.5–8.1)

## 2023-04-26 LAB — CBC WITH DIFFERENTIAL (CANCER CENTER ONLY)
Abs Immature Granulocytes: 0.03 10*3/uL (ref 0.00–0.07)
Basophils Absolute: 0.1 10*3/uL (ref 0.0–0.1)
Basophils Relative: 1 %
Eosinophils Absolute: 0.2 10*3/uL (ref 0.0–0.5)
Eosinophils Relative: 3 %
HCT: 37.2 % — ABNORMAL LOW (ref 39.0–52.0)
Hemoglobin: 11.8 g/dL — ABNORMAL LOW (ref 13.0–17.0)
Immature Granulocytes: 1 %
Lymphocytes Relative: 12 %
Lymphs Abs: 0.7 10*3/uL (ref 0.7–4.0)
MCH: 28.1 pg (ref 26.0–34.0)
MCHC: 31.7 g/dL (ref 30.0–36.0)
MCV: 88.6 fL (ref 80.0–100.0)
Monocytes Absolute: 0.8 10*3/uL (ref 0.1–1.0)
Monocytes Relative: 14 %
Neutro Abs: 4 10*3/uL (ref 1.7–7.7)
Neutrophils Relative %: 69 %
Platelet Count: 218 10*3/uL (ref 150–400)
RBC: 4.2 MIL/uL — ABNORMAL LOW (ref 4.22–5.81)
RDW: 17.1 % — ABNORMAL HIGH (ref 11.5–15.5)
WBC Count: 5.7 10*3/uL (ref 4.0–10.5)
nRBC: 0 % (ref 0.0–0.2)

## 2023-04-26 LAB — TOTAL PROTEIN, URINE DIPSTICK: Protein, ur: NEGATIVE mg/dL

## 2023-04-26 LAB — FERRITIN: Ferritin: 212 ng/mL (ref 24–336)

## 2023-04-26 MED ORDER — SODIUM CHLORIDE 0.9% FLUSH
10.0000 mL | Freq: Once | INTRAVENOUS | Status: AC
  Administered 2023-04-26: 10 mL

## 2023-04-26 MED ORDER — PALONOSETRON HCL INJECTION 0.25 MG/5ML
0.2500 mg | Freq: Once | INTRAVENOUS | Status: AC
  Administered 2023-04-26: 0.25 mg via INTRAVENOUS
  Filled 2023-04-26: qty 5

## 2023-04-26 MED ORDER — ATROPINE SULFATE 1 MG/ML IV SOLN
0.5000 mg | Freq: Once | INTRAVENOUS | Status: AC | PRN
  Administered 2023-04-26: 0.5 mg via INTRAVENOUS
  Filled 2023-04-26: qty 1

## 2023-04-26 MED ORDER — SODIUM CHLORIDE 0.9 % IV SOLN
2400.0000 mg/m2 | INTRAVENOUS | Status: DC
  Administered 2023-04-26: 4450 mg via INTRAVENOUS
  Filled 2023-04-26: qty 89

## 2023-04-26 MED ORDER — SODIUM CHLORIDE 0.9% FLUSH: 10.0000 mL | INTRAVENOUS | Status: DC | PRN

## 2023-04-26 MED ORDER — SODIUM CHLORIDE 0.9 % IV SOLN
400.0000 mg/m2 | Freq: Once | INTRAVENOUS | Status: AC
  Administered 2023-04-26: 740 mg via INTRAVENOUS
  Filled 2023-04-26: qty 25

## 2023-04-26 MED ORDER — SODIUM CHLORIDE 0.9 % IV SOLN
10.0000 mg | Freq: Once | INTRAVENOUS | Status: AC
  Administered 2023-04-26: 10 mg via INTRAVENOUS
  Filled 2023-04-26: qty 10

## 2023-04-26 MED ORDER — SODIUM CHLORIDE 0.9 % IV SOLN
5.0000 mg/kg | Freq: Once | INTRAVENOUS | Status: AC
  Administered 2023-04-26: 350 mg via INTRAVENOUS
  Filled 2023-04-26: qty 14

## 2023-04-26 MED ORDER — HEPARIN SOD (PORK) LOCK FLUSH 100 UNIT/ML IV SOLN: 500.0000 [IU] | Freq: Once | INTRAVENOUS | Status: DC | PRN

## 2023-04-26 MED ORDER — OXYCODONE HCL 5 MG PO TABS
5.0000 mg | ORAL_TABLET | Freq: Three times a day (TID) | ORAL | 0 refills | Status: DC | PRN
Start: 2023-04-26 — End: 2023-05-09

## 2023-04-26 MED ORDER — SODIUM CHLORIDE 0.9 % IV SOLN: Freq: Once | INTRAVENOUS | Status: DC

## 2023-04-26 MED ORDER — SODIUM CHLORIDE 0.9 % IV SOLN: Freq: Once | INTRAVENOUS | Status: AC

## 2023-04-26 MED ORDER — SODIUM CHLORIDE 0.9 % IV SOLN
140.0000 mg/m2 | Freq: Once | INTRAVENOUS | Status: AC
  Administered 2023-04-26: 260 mg via INTRAVENOUS
  Filled 2023-04-26: qty 13

## 2023-04-26 NOTE — Telephone Encounter (Signed)
Pt sent in refill request for oxycodone, see new order

## 2023-04-26 NOTE — Telephone Encounter (Signed)
Refill sent to pharmacy per patient's request. I have reviewed the PDMP during this encounter.  

## 2023-04-26 NOTE — Assessment & Plan Note (Signed)
-  [id number] with lung metastasis, MMR normal, KRAS(+) [id number]  -diagnosed 08/2021 by colonoscopy for rectal bleeding and frequent BM. Staging MRI showed early extra mesorectal lymph node involvement.  -baseline CEA 571.84 on 09/06/21.  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 Xeloda dose reduced due to elevated liver enzymes.  -due to new liver lesions, we switched to second-line FOLFIRI, with continued beva, on 02/21/22. He tolerates well overall.  - his recent restaging CT scan from 11/21/2022 showed stable lung and liver metastasis, no new lesions.   -Will continue current chemotherapy.  We previously discussed maintenance irinotecan alone, since he is tolerating FOLFIRI well, will continue for now. - restaging CT scan on April 22 showed stable disease overall. -He is clinically doing well, diarrhea is well-controlled, no other significant side effect from treatment.  Lab reviewed, adequate for treatment, will continue 

## 2023-04-26 NOTE — Progress Notes (Signed)
[facility]   Telephone:([phone]) [phone] Fax:(336) [phone]   Clinic Follow up Note   Patient Care Team: Pcp, No as PCP - General Pickenpack-Cousar, Arty Baumgartner, NP as Nurse Practitioner (Nurse Practitioner) Malachy Mood, MD as Consulting Physician (Hematology)  Date of Service:  04/26/2023  CHIEF COMPLAINT: f/u of  metastatic rectal cancer   CURRENT THERAPY:  Second-line FOLFIRI, q2weeks, started 02/22/22 -Bevacizumab started 11/24/21  ASSESSMENT:  Jorge [name] is a 72 y.o. 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 Staging MRI showed early extra mesorectal lymph node involvement.  -baseline CEA 571.84 on 09/06/21.  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 Xeloda dose reduced due to elevated liver enzymes.  -due to new liver lesions, we switched to second-line FOLFIRI, with continued beva, on 02/21/22. He tolerates well overall.  - his recent restaging CT scan from 11/21/2022 showed stable lung and liver metastasis, no new lesions.   -Will continue current chemotherapy.  We previously discussed maintenance irinotecan alone, since he is tolerating FOLFIRI well, will continue for now. - restaging CT scan on April 22 showed stable disease overall. -He is clinically doing well, diarrhea is well-controlled, no other significant side effect from treatment.  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 He is on gabapentin and cymbalta, taking 1oxycodone a day. He is followed by NP Lowella Bandy.        PLAN: -lab reviewed - I will order Ct scan next visit -Proceed with FOLFIR /Beva today at same dose and continue every 2 weeks   -lab/flush and treatment 7/3  SUMMARY OF ONCOLOGIC HISTORY: Oncology History Overview Note  Cancer Staging Rectal cancer [facility]) Staging form: Colon and Rectum, AJCC 8th Edition - Clinical stage from 08/29/2021: [name], cM1 - Signed by Malachy Mood, MD on 08/29/2021    Rectal cancer (HCC)  07/23/2021 Imaging   CT AP  IMPRESSION: 1. Appearance of the rectum likely indicates a rectal mass suspicious for rectal carcinoma. Inflammatory process would be less likely. Direct visualization is suggested. 2. Cholelithiasis without evidence of acute cholecystitis. 3. Aortic atherosclerosis.   08/19/2021 Procedure   Colonoscopy, under Dr. Meridee Score  Impression: - Rectal tenderness, palpable rectal mass and hemorrhoids found on digital rectal exam. - Stool in the entire examined colon - lavaged with still inadequate clearance. - One 35 mm polyp at the hepatic flexure. Biopsied. Tattooed distal in case future endoscopic resection is considered - however, has larger issues with rectal mass currently as below. - Four, 5 to 11 mm polyps in the transverse colon and at the hepatic flexure, removed with a cold snare. Resected and retrieved. - Diverticulosis in the recto-sigmoid colon and in the sigmoid colon. - Rule out malignancy, partially obstructing tumor in the rectum and from 6 to 13 cm proximal to the anus. Biopsied.   08/19/2021 Pathology Results   Diagnosis 1. Hepatic Flexure Biopsy - TUBULAR ADENOMA WITHOUT HIGH-GRADE DYSPLASIA OR MALIGNANCY 2. Transverse Colon Biopsy, and hepatic flexure, polyps (4) - TUBULAR ADENOMA WITHOUT HIGH-GRADE DYSPLASIA OR MALIGNANCY - OTHER FRAGMENTS OF POLYPOID COLONIC MUCOSA WITH NO SPECIFIC HISTOPATHOLOGIC CHANGES - FOOD MATERIAL 3. Rectum, biopsy - ADENOCARCINOMA. SEE NOTE   08/22/2021 Initial Diagnosis   Rectal cancer (HCC)   08/26/2021  Imaging   IMPRESSION: 1. A 2.2 x 1.9 cm right middle lobe pulmonary nodule as well as a total of four right  upper lobe pulmonary nodule and masses measuring up to 3.6 cm. Findings concerning for metastatic primary lung cancer versus less likely metastases in a patient with rectal cancer. Additional imaging evaluation or consultation with Pulmonology or Thoracic Surgery recommended. 2. No gross hilar adenopathy, noting limited sensitivity for the detection of hilar adenopathy on this noncontrast study. 3. Cholelithiasis. 4.  Emphysema (ICD10-J43.9). 5. At least left anterior descending coronary artery calcifications.   08/27/2021 Imaging   IMPRESSION: Rectal adenocarcinoma T stage: T4 B   Rectal adenocarcinoma N stage: N2 disease likely associated with early extra mesorectal lymph node involvement.   Distance from tumor to the internal anal sphincter is 1.2 cm.   Also with extramural venous involvement as described.   08/29/2021 Cancer Staging   Staging form: Colon and Rectum, AJCC 8th Edition - Clinical stage from 08/29/2021: [name], cM1 - Signed by Malachy Mood, MD on 08/29/2021 Stage prefix: Initial diagnosis   01/23/2022 Imaging   CT CAP w contrast IMPRESSION: 1. Mild interval decrease in size of multiple right-sided pulmonary nodules. No new suspicious pulmonary nodule or mass. 2. Interval development of approximately 10 new ill-defined hypoattenuating liver lesions ranging in size from approximately 5-10 mm. Imaging features suspicious for metastatic disease. MRI abdomen with and without contrast may prove helpful to further evaluate. 3. Similar appearance of wall thickening in the rectum with perirectal edema. 4. Cholelithiasis. 5. Prostatomegaly. 6. Aortic Atherosclerosis (ICD10-I70.0).   11/21/2022 Imaging    IMPRESSION: 1. Stable exam. No new or progressive interval findings. 2. The primary rectosigmoid lesion described previously is not well seen on the current study. There is presacral and perirectal edema, likely treatment related. 3. No substantial change in appearance of  bilateral pulmonary metastases. 4. Stable tiny hypodensities in both hepatic lobes. These were previously characterized as metastatic disease. No new liver lesions on today's exam. 5. Cholelithiasis. 6. 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 He was last seen by me on 04/12/2023. He presents to the clinic alone. Pt state that he is ready to get dentures, but he is not schedule for it yet. Pt state that he has some cold sensitivity when he is in the [facility] , so he goes  outside to keep from feeling it.    All other systems were reviewed with the patient and are negative.  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    SURGICAL HISTORY: Past Surgical History:  Procedure Laterality Date   BRONCHIAL BIOPSY  09/27/2021   Procedure: BRONCHIAL BIOPSIES;  Surgeon: Josephine Igo, DO;  Location: [facility];  Service: Pulmonary;;   BRONCHIAL BRUSHINGS  09/27/2021   Procedure: BRONCHIAL BRUSHINGS;  Surgeon: Josephine Igo, DO;  Location: [facility];  Service: Pulmonary;;   BRONCHIAL NEEDLE ASPIRATION BIOPSY  09/27/2021   Procedure: BRONCHIAL NEEDLE ASPIRATION BIOPSIES;  Surgeon: Josephine Igo, DO;  Location: [facility]OS[facility];  Service: Pulmonary;;   COLONOSCOPY     IR IMAGING GUIDED PORT INSERTION  10/24/2021   left  breast surgery Left 1968   VIDEO BRONCHOSCOPY WITH RADIAL ENDOBRONCHIAL ULTRASOUND  09/27/2021   Procedure: RADIAL ENDOBRONCHIAL ULTRASOUND;  Surgeon: Josephine Igo, DO;  Location: [facility] ENDOSCOPY;  Service: Pulmonary;;    I have reviewed the social history and family history with the patient and they are unchanged from previous note.  ALLERGIES:  has No Known Allergies.  MEDICATIONS:  Current Outpatient Medications  Medication Sig Dispense Refill   amLODipine (NORVASC) 10 MG tablet TAKE 1 TABLET BY MOUTH EVERY DAY 30 tablet 1   amLODipine (NORVASC) 5 MG tablet TAKE 1 TABLET (5 MG TOTAL) BY MOUTH DAILY. 30 tablet 2   carbonyl iron (FEOSOL) 45 MG TABS tablet Take 45 mg by mouth daily.     diphenoxylate-atropine (LOMOTIL) 2.5-0.025 MG tablet Take 1-2 tablets by mouth 4 (four) times daily as needed for diarrhea or loose stools. 120 tablet 1   gabapentin (NEURONTIN) 100 MG capsule Take 2 capsules (200 mg total) by mouth 3 (three) times daily. 180 capsule 1   Multiple Vitamin (MULTIVITAMIN WITH MINERALS) TABS tablet Take 1 tablet by mouth daily.     ondansetron (ZOFRAN) 8 MG tablet Take 1 tablet (8 mg total) by mouth every 8 (eight) hours as needed for nausea or vomiting. 30 tablet 3   oxyCODONE (OXY IR/ROXICODONE) 5 MG immediate release tablet Take 1 tablet (5 mg total) by mouth every 8 (eight) hours as needed for severe pain or breakthrough pain. 45 tablet 0   prochlorperazine (COMPAZINE) 10 MG tablet Take 1 tablet (10 mg total) by mouth every 6 (six) hours as needed (Nausea or vomiting). 30 tablet 1   No current facility-administered medications for this visit.   Facility-Administered Medications Ordered in Other Visits  Medication Dose Route Frequency Provider Last Rate Last Admin   0.9 %  sodium chloride infusion   Intravenous Once Malachy Mood, MD       fluorouracil (ADRUCIL) 4,450 mg in sodium chloride 0.9 % 61 mL chemo infusion  2,400 mg/m2 (Treatment Plan Recorded) Intravenous 1 day or 1  dose Malachy Mood, MD   Infusion Verify at 04/26/23 1301   heparin lock flush 100 unit/mL  500 Units Intracatheter Once PRN Malachy Mood, MD       sodium chloride flush (NS) 0.9 % injection 10 mL  10 mL Intracatheter PRN Malachy Mood, MD        PHYSICAL EXAMINATION: ECOG PERFORMANCE STATUS: 1 - Symptomatic but completely ambulatory  Vitals:   04/26/23 0923  BP: (!) 149/64  Pulse: 77  Resp: 18  Temp: 98.5 F (36.9 C)  SpO2: 100%   Wt Readings from Last 3 Encounters:  04/26/23 143 lb 14.4 oz (65.3 kg)  04/12/23 143 lb 9.6 oz (65.1 kg)  03/29/23 142 lb 14.4 oz (64.8 kg)     GENERAL:alert, no distress and comfortable SKIN: skin color normal, no rashes or significant lesions EYES: normal, Conjunctiva are pink and non-injected, sclera clear  NEURO: alert & oriented x 3 with fluent speech   LABORATORY DATA:  I have reviewed the data as listed    Latest Ref Rng & Units 04/26/2023    8:57 AM 04/12/2023  7:50 AM 03/29/2023    7:52 AM  CBC  WBC 4.0 - 10.5 K/uL 5.7  9.3  6.5   Hemoglobin 13.0 - 17.0 g/dL 09.8  11.9  14.7   Hematocrit 39.0 - 52.0 % 37.2  38.7  39.8   Platelets 150 - 400 K/uL 218  227  209         Latest Ref Rng & Units 04/26/2023    8:57 AM 04/12/2023    7:50 AM 03/29/2023    7:52 AM  CMP  Glucose 70 - 99 mg/dL 829  562  130   BUN 8 - 23 mg/dL 7  8  7    Creatinine 0.61 - 1.24 mg/dL 8.65  7.84  6.96   Sodium 135 - 145 mmol/L 139  137  139   Potassium 3.5 - 5.1 mmol/L 3.8  3.9  4.0   Chloride 98 - 111 mmol/L 105  103  104   CO2 22 - 32 mmol/L 29  30  31    Calcium 8.9 - 10.3 mg/dL 8.4  8.5  8.6   Total Protein 6.5 - 8.1 g/dL 6.2  6.9  7.1   Total Bilirubin 0.3 - 1.2 mg/dL 0.4  0.3  0.4   Alkaline Phos 38 - 126 U/L 122  152  151   AST 15 - 41 U/L 17  16  20    ALT 0 - 44 U/L 13  13  13        RADIOGRAPHIC STUDIES: I have personally reviewed the radiological images as listed and agreed with the findings in the report. No results found.    Orders Placed This  Encounter  Procedures   CBC with Differential (Cancer Center Only)    Standing Status:   Future    Standing Expiration Date:   06/06/2024   CMP (Cancer Center only)    Standing Status:   Future    Standing Expiration Date:   06/06/2024   Total Protein, Urine dipstick    Standing Status:   Future    Standing Expiration Date:   06/06/2024   CBC with Differential (Cancer Center Only)    Standing Status:   Future    Standing Expiration Date:   06/20/2024   CMP (Cancer Center only)    Standing Status:   Future    Standing Expiration Date:   06/20/2024   Total Protein, Urine dipstick    Standing Status:   Future    Standing Expiration Date:   06/20/2024   CBC with Differential (Cancer Center Only)    Standing Status:   Future    Standing Expiration Date:   07/04/2024   CMP (Cancer Center only)    Standing Status:   Future    Standing Expiration Date:   07/04/2024   Total Protein, Urine dipstick    Standing Status:   Future    Standing Expiration Date:   07/04/2024   All questions were answered. The patient knows to call the clinic with any problems, questions or concerns. No barriers to learning was detected. The total time spent in the appointment was 25 minutes.     Malachy Mood, MD 04/26/2023   Carolin Coy, CMA, am acting as scribe for Malachy Mood, MD.   I have reviewed the above documentation for accuracy and completeness, and I agree with the above.

## 2023-04-26 NOTE — Assessment & Plan Note (Signed)
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 He is on gabapentin and cymbalta, taking 1oxycodone a day. He is followed by NP Nikki.    

## 2023-04-27 ENCOUNTER — Encounter: Payer: Self-pay | Admitting: Hematology

## 2023-04-28 ENCOUNTER — Inpatient Hospital Stay: Payer: Medicare Other

## 2023-04-28 VITALS — BP 126/70 | HR 86 | Temp 96.6°F | Resp 18

## 2023-04-28 DIAGNOSIS — C7800 Secondary malignant neoplasm of unspecified lung: Secondary | ICD-10-CM

## 2023-04-28 DIAGNOSIS — Z5112 Encounter for antineoplastic immunotherapy: Secondary | ICD-10-CM | POA: Diagnosis not present

## 2023-04-28 MED ORDER — SODIUM CHLORIDE 0.9% FLUSH
10.0000 mL | INTRAVENOUS | Status: DC | PRN
  Administered 2023-04-28: 10 mL

## 2023-04-28 MED ORDER — PEGFILGRASTIM-JMDB 6 MG/0.6ML ~~LOC~~ SOSY
6.0000 mg | PREFILLED_SYRINGE | Freq: Once | SUBCUTANEOUS | Status: AC
  Administered 2023-04-28: 6 mg via SUBCUTANEOUS
  Filled 2023-04-28: qty 0.6

## 2023-04-28 MED ORDER — HEPARIN SOD (PORK) LOCK FLUSH 100 UNIT/ML IV SOLN
500.0000 [IU] | Freq: Once | INTRAVENOUS | Status: AC | PRN
  Administered 2023-04-28: 500 [IU]

## 2023-04-28 NOTE — Patient Instructions (Signed)

## 2023-05-02 ENCOUNTER — Emergency Department (HOSPITAL_COMMUNITY): Payer: Medicare Other

## 2023-05-02 ENCOUNTER — Other Ambulatory Visit: Payer: Self-pay

## 2023-05-02 ENCOUNTER — Encounter (HOSPITAL_COMMUNITY): Payer: Self-pay

## 2023-05-02 ENCOUNTER — Emergency Department (HOSPITAL_COMMUNITY)
Admission: EM | Admit: 2023-05-02 | Discharge: 2023-05-02 | Disposition: A | Discharge status: Home / Self Care | Payer: Medicare Other | Attending: Emergency Medicine | Admitting: Emergency Medicine

## 2023-05-02 DIAGNOSIS — R197 Diarrhea, unspecified: Secondary | ICD-10-CM | POA: Insufficient documentation

## 2023-05-02 DIAGNOSIS — R319 Hematuria, unspecified: Secondary | ICD-10-CM | POA: Diagnosis present

## 2023-05-02 DIAGNOSIS — Z79899 Other long term (current) drug therapy: Secondary | ICD-10-CM | POA: Diagnosis not present

## 2023-05-02 DIAGNOSIS — N3001 Acute cystitis with hematuria: Secondary | ICD-10-CM | POA: Insufficient documentation

## 2023-05-02 DIAGNOSIS — R110 Nausea: Secondary | ICD-10-CM | POA: Insufficient documentation

## 2023-05-02 LAB — URINALYSIS, W/ REFLEX TO CULTURE (INFECTION SUSPECTED)
Bilirubin Urine: NEGATIVE
Glucose, UA: NEGATIVE mg/dL
Ketones, ur: NEGATIVE mg/dL
Nitrite: NEGATIVE
Protein, ur: 100 mg/dL — AB
RBC / HPF: >50 RBC/hpf (ref 0–5)
Specific Gravity, Urine: 1.02 (ref 1.005–1.030)
pH: 6 (ref 5.0–8.0)

## 2023-05-02 LAB — COMPREHENSIVE METABOLIC PANEL
ALT: 24 U/L (ref 0–44)
AST: 31 U/L (ref 15–41)
Albumin: 3.2 g/dL — ABNORMAL LOW (ref 3.5–5.0)
Alkaline Phosphatase: 237 U/L — ABNORMAL HIGH (ref 38–126)
Anion gap: 8 (ref 5–15)
BUN: 11 mg/dL (ref 8–23)
CO2: 27 mmol/L (ref 22–32)
Calcium: 8.4 mg/dL — ABNORMAL LOW (ref 8.9–10.3)
Chloride: 100 mmol/L (ref 98–111)
Creatinine, Ser: 0.87 mg/dL (ref 0.61–1.24)
GFR, Estimated: >60 mL/min (ref >60)
Glucose, Bld: 68 mg/dL — ABNORMAL LOW (ref 70–99)
Potassium: 3.5 mmol/L (ref 3.5–5.1)
Sodium: 135 mmol/L (ref 135–145)
Total Bilirubin: 0.5 mg/dL (ref 0.3–1.2)
Total Protein: 7.7 g/dL (ref 6.5–8.1)

## 2023-05-02 LAB — CBC WITH DIFFERENTIAL/PLATELET
Abs Immature Granulocytes: 0.21 10*3/uL — ABNORMAL HIGH (ref 0.00–0.07)
Basophils Absolute: 0.1 10*3/uL (ref 0.0–0.1)
Basophils Relative: 0 %
Eosinophils Absolute: 0.1 10*3/uL (ref 0.0–0.5)
Eosinophils Relative: 1 %
HCT: 41.2 % (ref 39.0–52.0)
Hemoglobin: 12.6 g/dL — ABNORMAL LOW (ref 13.0–17.0)
Immature Granulocytes: 1 %
Lymphocytes Relative: 6 %
Lymphs Abs: 1.2 10*3/uL (ref 0.7–4.0)
MCH: 27.1 pg (ref 26.0–34.0)
MCHC: 30.6 g/dL (ref 30.0–36.0)
MCV: 88.6 fL (ref 80.0–100.0)
Monocytes Absolute: 3.2 10*3/uL — ABNORMAL HIGH (ref 0.1–1.0)
Monocytes Relative: 16 %
Neutro Abs: 15.5 10*3/uL — ABNORMAL HIGH (ref 1.7–7.7)
Neutrophils Relative %: 76 %
Platelets: 241 10*3/uL (ref 150–400)
RBC: 4.65 MIL/uL (ref 4.22–5.81)
RDW: 17.8 % — ABNORMAL HIGH (ref 11.5–15.5)
WBC: 20.3 10*3/uL — ABNORMAL HIGH (ref 4.0–10.5)
nRBC: 0 % (ref 0.0–0.2)

## 2023-05-02 LAB — I-STAT CHEM 8, ED
BUN: 9 mg/dL (ref 8–23)
Calcium, Ion: 1.18 mmol/L (ref 1.15–1.40)
Chloride: 98 mmol/L (ref 98–111)
Creatinine, Ser: 0.8 mg/dL (ref 0.61–1.24)
Glucose, Bld: 71 mg/dL (ref 70–99)
HCT: 43 % (ref 39.0–52.0)
Hemoglobin: 14.6 g/dL (ref 13.0–17.0)
Potassium: 3.4 mmol/L — ABNORMAL LOW (ref 3.5–5.1)
Sodium: 138 mmol/L (ref 135–145)
TCO2: 26 mmol/L (ref 22–32)

## 2023-05-02 LAB — PROTIME-INR
INR: 1.2 (ref 0.8–1.2)
Prothrombin Time: 15.3 seconds — ABNORMAL HIGH (ref 11.4–15.2)

## 2023-05-02 LAB — MAGNESIUM: Magnesium: 2.3 mg/dL (ref 1.7–2.4)

## 2023-05-02 LAB — LACTIC ACID, PLASMA: Lactic Acid, Venous: 2 mmol/L (ref 0.5–1.9)

## 2023-05-02 MED ORDER — LACTATED RINGERS IV BOLUS (SEPSIS)
1000.0000 mL | Freq: Once | INTRAVENOUS | Status: AC
  Administered 2023-05-02: 1000 mL via INTRAVENOUS

## 2023-05-02 MED ORDER — HEPARIN SOD (PORK) LOCK FLUSH 100 UNIT/ML IV SOLN
500.0000 [IU] | Freq: Once | INTRAVENOUS | Status: AC
  Administered 2023-05-02: 500 [IU]
  Filled 2023-05-02: qty 5

## 2023-05-02 MED ORDER — TAMSULOSIN HCL 0.4 MG PO CAPS: 0.4000 mg | ORAL_CAPSULE | Freq: Every day | ORAL | 0 refills | Status: AC

## 2023-05-02 MED ORDER — SODIUM CHLORIDE 0.9 % IV SOLN
1.0000 g | Freq: Once | INTRAVENOUS | Status: AC
  Administered 2023-05-02: 1 g via INTRAVENOUS
  Filled 2023-05-02: qty 10

## 2023-05-02 MED ORDER — CEFPODOXIME PROXETIL 200 MG PO TABS: 200.0000 mg | ORAL_TABLET | Freq: Two times a day (BID) | ORAL | 0 refills | Status: DC

## 2023-05-02 MED ORDER — IOHEXOL 300 MG/ML  SOLN
100.0000 mL | Freq: Once | INTRAMUSCULAR | Status: AC | PRN
  Administered 2023-05-02: 100 mL via INTRAVENOUS

## 2023-05-02 NOTE — ED Provider Notes (Signed)
[place] EMERGENCY DEPARTMENT AT [facility] Provider Note   CSN: [id number] Arrival date & time: 05/02/23  1233     History  Chief Complaint  Patient presents with   Hematuria    Jorge [name] is a 72 y.o. male.   Hematuria Associated symptoms include abdominal pain.  Patient presents for hematuria.  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  Metastatic cancer was diagnosed 2 years ago.  He is currently on FOLFIRI chemotherapy.  Last infusion was 1 week ago.  He was last seen by his oncologist 6 days ago.  Yesterday, he was noticed normal state of health.  Last night, he had diarrhea, hematuria, abdominal pain, and nausea.  His daughter, who company seen in the ED thought that he may have had blood in his stool.  Patient denies this.  Today, he endorses dysuria.  His abdominal pain and nausea has resolved.  He has been able to drink an Ensure today.     Home Medications Prior to Admission medications   Medication Sig Start Date End Date Taking? Authorizing Provider  cefpodoxime (VANTIN) 200 MG tablet Take 1 tablet (200 mg total) by mouth 2 (two) times daily for 10 days. 05/02/23 05/12/23 Yes Gloris Manchester, MD  tamsulosin (FLOMAX) 0.4 MG CAPS capsule Take 1 capsule (0.4 mg total) by mouth daily. 05/02/23 06/01/23 Yes Gloris Manchester, MD  amLODipine (NORVASC) 10 MG tablet TAKE 1 TABLET BY MOUTH EVERY DAY 04/12/23   Malachy Mood, MD  amLODipine (NORVASC) 5 MG tablet TAKE 1 TABLET (5 MG TOTAL) BY MOUTH DAILY. 02/14/23   Malachy Mood, MD  carbonyl iron (FEOSOL) 45 MG TABS tablet Take 45 mg by mouth daily.    [provider]  diphenoxylate-atropine (LOMOTIL) 2.5-0.025 MG tablet Take 1-2 tablets by mouth 4 (four) times daily as needed for diarrhea or loose stools. 04/12/23   Malachy Mood, MD  gabapentin (NEURONTIN) 100 MG capsule Take 2 capsules (200 mg total) by mouth 3 (three) times daily. 04/12/23   Pickenpack-Cousar, Arty Baumgartner, NP  Multiple  Vitamin (MULTIVITAMIN WITH MINERALS) TABS tablet Take 1 tablet by mouth daily.    [provider]  ondansetron (ZOFRAN) 8 MG tablet Take 1 tablet (8 mg total) by mouth every 8 (eight) hours as needed for nausea or vomiting. 03/29/23   Malachy Mood, MD  oxyCODONE (OXY IR/ROXICODONE) 5 MG immediate release tablet Take 1 tablet (5 mg total) by mouth every 8 (eight) hours as needed for severe pain or breakthrough pain. 04/26/23   Shanon Ace, PA-C  prochlorperazine (COMPAZINE) 10 MG tablet Take 1 tablet (10 mg total) by mouth every 6 (six) hours as needed (Nausea or vomiting). 03/29/23   Malachy Mood, MD  ondansetron (ZOFRAN) 8 MG tablet Take 1 tablet (8 mg total) by mouth every 8 (eight) hours as needed for nausea or vomiting. 09/09/21   Malachy Mood, MD      Allergies    Patient has no known allergies.    Review of Systems   Review of Systems  Gastrointestinal:  Positive for abdominal pain, diarrhea and nausea.  Genitourinary:  Positive for dysuria and hematuria.  All other systems reviewed and are negative.   Physical Exam Updated Vital Signs BP (!) 155/88 (BP Location: Right Arm)   Pulse 76   Temp 98.2 F (36.8 C) (Oral)   Resp (!) 24   Ht 6' (1.829 m)   Wt 65.3 kg   SpO2 96%   BMI  19.52 kg/m  Physical Exam Vitals and nursing note reviewed.  Constitutional:      General: He is not in acute distress.    Appearance: Normal appearance. He is well-developed. He is ill-appearing (Chronically). He is not toxic-appearing or diaphoretic.  HENT:     Head: Normocephalic and atraumatic.     Right Ear: External ear normal.     Left Ear: External ear normal.     Nose: Nose normal.     Mouth/Throat:     Mouth: Mucous membranes are moist.  Eyes:     Extraocular Movements: Extraocular movements intact.     Conjunctiva/sclera: Conjunctivae normal.  Cardiovascular:     Rate and Rhythm: Normal rate and regular rhythm.  Pulmonary:     Effort: Pulmonary effort is normal. No  respiratory distress.  Abdominal:     General: There is no distension.     Palpations: Abdomen is soft.     Tenderness: There is no abdominal tenderness.  Musculoskeletal:        General: No swelling. Normal range of motion.     Cervical back: Normal range of motion and neck supple.     Right lower leg: No edema.     Left lower leg: No edema.  Skin:    General: Skin is warm and dry.     Coloration: Skin is not jaundiced or pale.  Neurological:     General: No focal deficit present.     Mental Status: He is alert and oriented to person, place, and time.  Psychiatric:        Mood and Affect: Mood normal.        Behavior: Behavior normal.     ED Results / Procedures / Treatments   Labs (all labs ordered are listed, but only abnormal results are displayed) Labs Reviewed  LACTIC ACID, PLASMA - Abnormal; Notable for the following components:      Result Value   Lactic Acid, Venous 2.0 (*)    All other components within normal limits  COMPREHENSIVE METABOLIC PANEL - Abnormal; Notable for the following components:   Glucose, Bld 68 (*)    Calcium 8.4 (*)    Albumin 3.2 (*)    Alkaline Phosphatase 237 (*)    All other components within normal limits  CBC WITH DIFFERENTIAL/PLATELET - Abnormal; Notable for the following components:   WBC 20.3 (*)    Hemoglobin 12.6 (*)    RDW 17.8 (*)    Neutro Abs 15.5 (*)    Monocytes Absolute 3.2 (*)    Abs Immature Granulocytes 0.21 (*)    All other components within normal limits  PROTIME-INR - Abnormal; Notable for the following components:   Prothrombin Time 15.3 (*)    All other components within normal limits  URINALYSIS, W/ REFLEX TO CULTURE (INFECTION SUSPECTED) - Abnormal; Notable for the following components:   Color, Urine AMBER (*)    APPearance HAZY (*)    Hgb urine dipstick LARGE (*)    Protein, ur 100 (*)    Leukocytes,Ua TRACE (*)    Bacteria, UA RARE (*)    All other components within normal limits  I-STAT CHEM 8, ED -  Abnormal; Notable for the following components:   Potassium 3.4 (*)    All other components within normal limits  CULTURE, BLOOD (ROUTINE X 2)  CULTURE, BLOOD (ROUTINE X 2)  URINE CULTURE  MAGNESIUM  LACTIC ACID, PLASMA  POC OCCULT BLOOD, ED    EKG EKG Interpretation  Date/Time:  Wednesday May 02 2023 14:19:32 EDT Ventricular Rate:  81 PR Interval:  173 QRS Duration: 91 QT Interval:  388 QTC Calculation: 451 R Axis:   77 Text Interpretation: Sinus rhythm Premature atrial complexes Confirmed by Gloris Manchester [phone]) on 05/02/2023 4:33:14 PM  Radiology CT ABDOMEN PELVIS W CONTRAST  Result Date: 05/02/2023 CLINICAL DATA:  Acute abdominal pain. EXAM: CT ABDOMEN AND PELVIS WITH CONTRAST TECHNIQUE: Multidetector CT imaging of the abdomen and pelvis was performed using the standard protocol following bolus administration of intravenous contrast. RADIATION DOSE REDUCTION: This exam was performed according to the departmental dose-optimization program which includes automated exposure control, adjustment of the mA and/or kV according to patient size and/or use of iterative reconstruction technique. CONTRAST:  OMNIPAQUE IOHEXOL 300 MG/ML  SOLN COMPARISON:  February 26, 2023. FINDINGS: Lower chest: Right lower lobe pleural/subpleural thickening. Hepatobiliary: Numerous less than 4 mm hypoattenuated lesions throughout the liver are grossly stable from April 2024. Cholelithiasis. Pancreas: Unremarkable. No pancreatic ductal dilatation or surrounding inflammatory changes. Spleen: Normal in size without focal abnormality. Adrenals/Urinary Tract: Adrenal glands are unremarkable. Kidneys are normal, without renal calculi, focal lesion, or hydronephrosis. Bladder is unremarkable. Stomach/Bowel: Stomach is within normal limits. No evidence of mentis itis. No evidence of small bowel wall thickening, distention, or inflammatory changes. Known rectal mass, eccentric to the right is grossly stable. Ill-defined  presacral space and perirectal fat stranding again seen. Vascular/Lymphatic: Aortic atherosclerosis. No enlarged abdominal or pelvic lymph nodes. Reproductive: Mild enlargement of the prostate gland which measures 4.8 cm. Other: No abdominal wall hernia or abnormality. No abdominopelvic ascites. Musculoskeletal: No aggressive appearing osseous lesions. IMPRESSION: 1. Known rectal mass, eccentric to the right is grossly stable. Ill-defined presacral space and perirectal fat stranding again seen. 2. Numerous less than 4 mm hypoattenuated lesions throughout the liver are grossly stable from April 2024. 3. Cholelithiasis. 4. Mild enlargement of the prostate gland. 5. Aortic atherosclerosis. Aortic Atherosclerosis (ICD10-I70.0). Electronically Signed   By: Ted Mcalpine M.D.   On: 05/02/2023 15:05   DG Chest Port 1 View  Result Date: 05/02/2023 CLINICAL DATA:  Question sepsis EXAM: PORTABLE CHEST 1 VIEW COMPARISON:  09/27/2021 FINDINGS: Artifact overlies the chest. Power port in place on the right with the tip in the SVC. Left lung is clear. On the right, perihilar densities are improved compared to the prior study, but there appears to be a residual 2.7 cm nodular shadow in the upper lobe. No evidence of infectious pneumonia or pleural effusion. IMPRESSION: Improved appearance of the right perihilar region. Residual 2.7 cm nodular shadow in the right upper lobe. No evidence of infectious pneumonia or pleural effusion. Electronically Signed   By: Paulina Fusi M.D.   On: 05/02/2023 14:40    Procedures Procedures    Medications Ordered in ED Medications  cefTRIAXone (ROCEPHIN) 1 g in sodium chloride 0.9 % 100 mL IVPB (has no administration in time range)  lactated ringers bolus 1,000 mL (1,000 mLs Intravenous New Bag/Given 05/02/23 1421)  iohexol (OMNIPAQUE) 300 MG/ML solution 100 mL (100 mLs Intravenous Contrast Given 05/02/23 1433)    ED Course/ Medical Decision Making/ A&P                              Medical Decision Making Amount and/or Complexity of Data Reviewed Labs: ordered. Radiology: ordered. ECG/medicine tests: ordered.  Risk Prescription drug management.   This patient presents to the ED for concern of hematuria,  this involves an extensive number of treatment options, and is a complaint that carries with it a high risk of complications and morbidity.  The differential diagnosis includes cystitis, prostatitis, neoplasm, nephrolithiasis, medication side effects   Co morbidities that complicate the patient evaluation  rectal cancer with metastases to lungs, neuropathy, anemia   Additional history obtained:  Additional history obtained from patient's daughter External records from outside source obtained and reviewed including EMR   Lab Tests:  I Ordered, and personally interpreted labs.  The pertinent results include: Normal kidney function, normal electrolytes, low-normal glucose, baseline anemia, increased leukocytosis, evidence of UTI   Imaging Studies ordered:  I ordered imaging studies including chest x-ray, CT of abdomen and pelvis I independently visualized and interpreted imaging which showed no evidence of pneumonia on x-ray, CT scan shows redemonstration of known rectal mass, cholelithiasis, liver lesions.  No acute findings. I agree with the radiologist interpretation   Cardiac Monitoring: / EKG:  The patient was maintained on a cardiac monitor.  I personally viewed and interpreted the cardiac monitored which showed an underlying rhythm of: Sinus rhythm  Problem List / ED Course / Critical interventions / Medication management  Patient presents for recent diarrhea, possible hematochezia, hematuria, and dysuria.  Onset of the symptoms was last night.  On arrival in the ED, vital signs are notable for moderate hypertension.  Patient currently denies any symptoms.  He has a dysuria today.  Workup was initiated.  IV fluids were ordered.  Patient had a  bowel movement while in the ED which was normal in color and nondiarrheal.  There was no evidence of blood or melena.  Patient's lab work is notable for leukocytosis and evidence of UTI on urinalysis.  Ceftriaxone was ordered.  CT scan showed redemonstration of known findings without any acute findings.  Patient remained asymptomatic while in the ED.  With IV fluids, he had good urine output.  Urine is orange in color.  He was given food and drink.  Patient was prescribed continued antibiotics as well as tamsulosin.  He was advised to follow-up with urology for culture results and possible further evaluation of his BPH and recent hematuria.  He was discharged in stable condition. I ordered medication including IVF for hydration; ceftriaxone for UTI Reevaluation of the patient after these medicines showed that the patient improved I have reviewed the patients home medicines and have made adjustments as needed   Social Determinants of Health:  Has access to outpatient care, lives at home with family         Final Clinical Impression(s) / ED Diagnoses Final diagnoses:  Acute cystitis with hematuria    Rx / DC Orders ED Discharge Orders          Ordered    cefpodoxime (VANTIN) 200 MG tablet  2 times daily        05/02/23 1701    tamsulosin (FLOMAX) 0.4 MG CAPS capsule  Daily        05/02/23 1702              Gloris Manchester, MD 05/02/23 1715

## 2023-05-02 NOTE — Discharge Instructions (Addendum)
You were treated with your first dose of antibiotics for a UTI in the emergency department.  You should continue antibiotics for the next 10 days, starting tomorrow.  A prescription for an antibiotic called cefpodoxime was sent to your pharmacy.  Take twice a day as prescribed.    You have an enlarged prostate.  A prescription for a medication called tamsulosin was also sent to your pharmacy.  This can improve urination with an enlarged prostate.  Take daily as prescribed.  You should also establish care with a urologist.  Contact information is below.   Continue to follow-up with your oncologist.   Return to the emergency department for any new or worsening symptoms of concern.

## 2023-05-02 NOTE — ED Triage Notes (Signed)
Hematuria since last night. Pt states he has significant gross bleeding coming from his urethra. Pt states he has been vomiting and c/o abdominal pain. Denies pain when urinating.  Stage 4 rectal cancer with mets to lungs. Chemo every 2 weeks, last tx 6/19.

## 2023-05-03 LAB — CULTURE, BLOOD (ROUTINE X 2): Culture: NO GROWTH

## 2023-05-03 LAB — URINE CULTURE: Culture: NO GROWTH

## 2023-05-04 LAB — CULTURE, BLOOD (ROUTINE X 2)

## 2023-05-05 LAB — CULTURE, BLOOD (ROUTINE X 2)

## 2023-05-07 LAB — CULTURE, BLOOD (ROUTINE X 2): Culture: NO GROWTH

## 2023-05-07 NOTE — Progress Notes (Unsigned)
Palliative Medicine [facility]  [phone]) [phone] Fax:([phone]) [phone]   Name: Jorge [name] Date: 05/07/2023 MRN: [id number]  DOB: [birth year]  Patient Care Team: Pcp, No as PCP - General Pickenpack-Cousar, Arty Baumgartner, NP as Nurse Practitioner (Nurse Practitioner) Malachy Mood, MD as Consulting Physician (Hematology)   INTERVAL HISTORY: Jorge [name] is a 72 y.o. 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  Palliative ask to see for symptom management and goals of care.   SOCIAL HISTORY:     reports that he has been smoking cigarettes. He has a 5.00 pack-year smoking history. He has never used smokeless tobacco. He reports that he does not currently use alcohol. He reports that he does not use drugs.  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  MEDICATIONS:  Current Outpatient Medications  Medication Sig Dispense Refill   amLODipine (NORVASC) 10 MG tablet TAKE 1 TABLET BY MOUTH EVERY DAY 30 tablet 1   amLODipine (NORVASC) 5 MG tablet TAKE 1 TABLET (5 MG TOTAL) BY MOUTH DAILY. 30 tablet 2   carbonyl iron (FEOSOL) 45 MG TABS tablet Take 45 mg by mouth daily.     cefpodoxime (VANTIN) 200 MG tablet Take 1 tablet (200 mg total) by mouth 2 (two) times daily for 10 days. 20 tablet 0   diphenoxylate-atropine (LOMOTIL) 2.5-0.025 MG tablet Take 1-2 tablets by mouth 4 (four) times daily as needed for diarrhea or loose stools. 120 tablet 1   gabapentin (NEURONTIN) 100 MG capsule Take 2 capsules (200 mg total) by mouth 3 (three) times daily. 180 capsule 1   Multiple Vitamin (MULTIVITAMIN WITH MINERALS) TABS tablet Take 1 tablet by mouth daily.     ondansetron (ZOFRAN) 8 MG tablet Take 1 tablet (8 mg total) by mouth  every 8 (eight) hours as needed for nausea or vomiting. 30 tablet 3   oxyCODONE (OXY IR/ROXICODONE) 5 MG immediate release tablet Take 1 tablet (5 mg total) by mouth every 8 (eight) hours as needed for severe pain or breakthrough pain. 45 tablet 0   prochlorperazine (COMPAZINE) 10 MG tablet Take 1 tablet (10 mg total) by mouth every 6 (six) hours as needed (Nausea or vomiting). 30 tablet 1   tamsulosin (FLOMAX) 0.4 MG CAPS capsule Take 1 capsule (0.4 mg total) by mouth daily. 30 capsule 0   No current facility-administered medications for this visit.    VITAL SIGNS: There were no vitals taken for this visit. There were no vitals filed for this visit.   Estimated body mass index is 19.52 kg/m as calculated from the following:   Height as of 05/02/23: 6' (1.829 m).   Weight as of 05/02/23: 143 lb 14.4 oz (65.3 kg).   PERFORMANCE STATUS (ECOG) : 1 - Symptomatic but completely ambulatory  Assessment NAD, ambulatory RRR Normal breathing pattern  AAO x3, mood appropriate    IMPRESSION:  Jorge [name] presents to clinic today for follow-up. No acute distress noted. He was recently seen in the ED and treated for cystitis. Reports symptoms have resolved. Denies any signs of hematuria or dysuria. Completed antibiotics. Overall is doing well. Back to baseline. Is enjoying his time with his grandchildren in the home. Denies nausea, vomiting, constipation, or diarrhea. Appetite is good. Is  trying to be active. States he walks daily, usually to corner store or outside.   Neuropathic Pain  Jorge [name] reports his pain is well controlled on current regimen. Some days continue to be better than others. Tolerating gabapentin and oxycodone with noticeable improvement to his to neuropathic pain.    No changes to current regimen.   Will continue to closely monitor and support as needed.   PLAN:  Gabapentin 300mg  three times daily.   Oxy IR 5mg  every 8 hours as needed. Not requiring around the clock.   I will plan to see patient back in 3-4 weeks in collaboration with his oncology appointments.   Patient expressed understanding and was in agreement with this plan. He also understands that He can call the clinic at any time with any questions, concerns, or complaints.     Any controlled substances utilized were prescribed in the context of palliative care. PDMP has been reviewed.    Visit consisted of counseling and education dealing with the complex and emotionally intense issues of symptom management and palliative care in the setting of serious and potentially life-threatening illness.Greater than 50%  of this time was spent counseling and coordinating care related to the above assessment and plan.  Jorge [name], AGPCNP-BC  Palliative Medicine Team/[facility]  *Please note that this is a verbal dictation therefore any spelling or grammatical errors are due to the "[employer] One" system interpretation.

## 2023-05-08 MED FILL — Dexamethasone Sodium Phosphate Inj 100 MG/10ML: INTRAMUSCULAR | Qty: 1 | Status: AC

## 2023-05-08 NOTE — Assessment & Plan Note (Signed)
-  [id number] with lung metastasis, MMR normal, KRAS(+) [id number]  -diagnosed 08/2021 by colonoscopy for rectal bleeding and frequent BM. Staging MRI showed early extra mesorectal lymph node involvement.  -baseline CEA 571.84 on 09/06/21.  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 Xeloda dose reduced due to elevated liver enzymes.  -due to new liver lesions, we switched to second-line FOLFIRI, with continued beva, on 02/21/22. He tolerates well overall.  - his recent restaging CT scan from 11/21/2022 showed stable lung and liver metastasis, no new lesions.   -Will continue current chemotherapy.  We previously discussed maintenance irinotecan alone, since he is tolerating FOLFIRI well, will continue for now. - restaging CT scan on April 22 showed stable disease overall. -He is clinically doing well, diarrhea is well-controlled, no other significant side effect from treatment.  Lab reviewed, adequate for treatment, will continue 

## 2023-05-08 NOTE — Assessment & Plan Note (Signed)
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 He is on gabapentin and cymbalta, taking 1oxycodone a day. He is followed by NP Nikki.    

## 2023-05-09 ENCOUNTER — Inpatient Hospital Stay: Payer: Medicare Other

## 2023-05-09 ENCOUNTER — Encounter: Payer: Self-pay | Admitting: Nurse Practitioner

## 2023-05-09 ENCOUNTER — Inpatient Hospital Stay (HOSPITAL_BASED_OUTPATIENT_CLINIC_OR_DEPARTMENT_OTHER): Payer: Medicare Other | Admitting: Nurse Practitioner

## 2023-05-09 ENCOUNTER — Other Ambulatory Visit: Payer: Self-pay

## 2023-05-09 ENCOUNTER — Inpatient Hospital Stay: Payer: Medicare Other | Attending: Physician Assistant | Admitting: Hematology

## 2023-05-09 VITALS — BP 155/72 | HR 82 | Temp 98.0°F | Resp 18 | Ht 72.0 in | Wt 143.3 lb

## 2023-05-09 DIAGNOSIS — Z5112 Encounter for antineoplastic immunotherapy: Secondary | ICD-10-CM | POA: Insufficient documentation

## 2023-05-09 DIAGNOSIS — C7800 Secondary malignant neoplasm of unspecified lung: Secondary | ICD-10-CM

## 2023-05-09 DIAGNOSIS — C787 Secondary malignant neoplasm of liver and intrahepatic bile duct: Secondary | ICD-10-CM | POA: Diagnosis not present

## 2023-05-09 DIAGNOSIS — C20 Malignant neoplasm of rectum: Secondary | ICD-10-CM | POA: Diagnosis not present

## 2023-05-09 DIAGNOSIS — Z5111 Encounter for antineoplastic chemotherapy: Secondary | ICD-10-CM | POA: Diagnosis present

## 2023-05-09 DIAGNOSIS — G620 Drug-induced polyneuropathy: Secondary | ICD-10-CM | POA: Diagnosis not present

## 2023-05-09 DIAGNOSIS — M792 Neuralgia and neuritis, unspecified: Secondary | ICD-10-CM

## 2023-05-09 DIAGNOSIS — Z5189 Encounter for other specified aftercare: Secondary | ICD-10-CM | POA: Insufficient documentation

## 2023-05-09 DIAGNOSIS — Z515 Encounter for palliative care: Secondary | ICD-10-CM

## 2023-05-09 DIAGNOSIS — T451X5A Adverse effect of antineoplastic and immunosuppressive drugs, initial encounter: Secondary | ICD-10-CM

## 2023-05-09 DIAGNOSIS — Z95828 Presence of other vascular implants and grafts: Secondary | ICD-10-CM

## 2023-05-09 DIAGNOSIS — F17210 Nicotine dependence, cigarettes, uncomplicated: Secondary | ICD-10-CM | POA: Diagnosis not present

## 2023-05-09 DIAGNOSIS — G893 Neoplasm related pain (acute) (chronic): Secondary | ICD-10-CM | POA: Diagnosis not present

## 2023-05-09 DIAGNOSIS — C775 Secondary and unspecified malignant neoplasm of intrapelvic lymph nodes: Secondary | ICD-10-CM | POA: Diagnosis not present

## 2023-05-09 LAB — CMP (CANCER CENTER ONLY)
ALT: 17 U/L (ref 0–44)
AST: 22 U/L (ref 15–41)
Albumin: 3.1 g/dL — ABNORMAL LOW (ref 3.5–5.0)
Alkaline Phosphatase: 154 U/L — ABNORMAL HIGH (ref 38–126)
Anion gap: 3 — ABNORMAL LOW (ref 5–15)
BUN: 9 mg/dL (ref 8–23)
CO2: 31 mmol/L (ref 22–32)
Calcium: 8.5 mg/dL — ABNORMAL LOW (ref 8.9–10.3)
Chloride: 103 mmol/L (ref 98–111)
Creatinine: 0.63 mg/dL (ref 0.61–1.24)
GFR, Estimated: >60 mL/min (ref >60)
Glucose, Bld: 127 mg/dL — ABNORMAL HIGH (ref 70–99)
Potassium: 3.8 mmol/L (ref 3.5–5.1)
Sodium: 137 mmol/L (ref 135–145)
Total Bilirubin: 0.3 mg/dL (ref 0.3–1.2)
Total Protein: 6.3 g/dL — ABNORMAL LOW (ref 6.5–8.1)

## 2023-05-09 LAB — CBC WITH DIFFERENTIAL (CANCER CENTER ONLY)
Abs Immature Granulocytes: 0.04 10*3/uL (ref 0.00–0.07)
Basophils Absolute: 0 10*3/uL (ref 0.0–0.1)
Basophils Relative: 1 %
Eosinophils Absolute: 0.2 10*3/uL (ref 0.0–0.5)
Eosinophils Relative: 3 %
HCT: 37.8 % — ABNORMAL LOW (ref 39.0–52.0)
Hemoglobin: 11.9 g/dL — ABNORMAL LOW (ref 13.0–17.0)
Immature Granulocytes: 1 %
Lymphocytes Relative: 12 %
Lymphs Abs: 0.8 10*3/uL (ref 0.7–4.0)
MCH: 27.7 pg (ref 26.0–34.0)
MCHC: 31.5 g/dL (ref 30.0–36.0)
MCV: 88.1 fL (ref 80.0–100.0)
Monocytes Absolute: 0.6 10*3/uL (ref 0.1–1.0)
Monocytes Relative: 9 %
Neutro Abs: 4.8 10*3/uL (ref 1.7–7.7)
Neutrophils Relative %: 74 %
Platelet Count: 211 10*3/uL (ref 150–400)
RBC: 4.29 MIL/uL (ref 4.22–5.81)
RDW: 18.6 % — ABNORMAL HIGH (ref 11.5–15.5)
WBC Count: 6.4 10*3/uL (ref 4.0–10.5)
nRBC: 0 % (ref 0.0–0.2)

## 2023-05-09 LAB — TOTAL PROTEIN, URINE DIPSTICK: Protein, ur: NEGATIVE mg/dL

## 2023-05-09 MED ORDER — SODIUM CHLORIDE 0.9 % IV SOLN
5.0000 mg/kg | Freq: Once | INTRAVENOUS | Status: AC
  Administered 2023-05-09: 350 mg via INTRAVENOUS
  Filled 2023-05-09: qty 14

## 2023-05-09 MED ORDER — OXYCODONE HCL 5 MG PO TABS
5.0000 mg | ORAL_TABLET | Freq: Three times a day (TID) | ORAL | 0 refills | Status: DC | PRN
Start: 2023-05-09 — End: 2023-05-09

## 2023-05-09 MED ORDER — PALONOSETRON HCL INJECTION 0.25 MG/5ML
0.2500 mg | Freq: Once | INTRAVENOUS | Status: AC
  Administered 2023-05-09: 0.25 mg via INTRAVENOUS
  Filled 2023-05-09: qty 5

## 2023-05-09 MED ORDER — SODIUM CHLORIDE 0.9% FLUSH
10.0000 mL | Freq: Once | INTRAVENOUS | Status: AC
  Administered 2023-05-09: 10 mL

## 2023-05-09 MED ORDER — SODIUM CHLORIDE 0.9% FLUSH: 10.0000 mL | INTRAVENOUS | Status: DC | PRN

## 2023-05-09 MED ORDER — SODIUM CHLORIDE 0.9 % IV SOLN: Freq: Once | INTRAVENOUS | Status: AC

## 2023-05-09 MED ORDER — SODIUM CHLORIDE 0.9 % IV SOLN
2400.0000 mg/m2 | INTRAVENOUS | Status: DC
  Administered 2023-05-09: 4450 mg via INTRAVENOUS
  Filled 2023-05-09: qty 89

## 2023-05-09 MED ORDER — SODIUM CHLORIDE 0.9 % IV SOLN
10.0000 mg | Freq: Once | INTRAVENOUS | Status: AC
  Administered 2023-05-09: 10 mg via INTRAVENOUS
  Filled 2023-05-09: qty 10

## 2023-05-09 MED ORDER — SODIUM CHLORIDE 0.9 % IV SOLN
140.0000 mg/m2 | Freq: Once | INTRAVENOUS | Status: AC
  Administered 2023-05-09: 260 mg via INTRAVENOUS
  Filled 2023-05-09: qty 13

## 2023-05-09 MED ORDER — SODIUM CHLORIDE 0.9 % IV SOLN
400.0000 mg/m2 | Freq: Once | INTRAVENOUS | Status: AC
  Administered 2023-05-09: 740 mg via INTRAVENOUS
  Filled 2023-05-09: qty 25

## 2023-05-09 MED ORDER — ATROPINE SULFATE 1 MG/ML IV SOLN
0.5000 mg | Freq: Once | INTRAVENOUS | Status: AC | PRN
  Administered 2023-05-09: 0.5 mg via INTRAVENOUS
  Filled 2023-05-09: qty 1

## 2023-05-09 MED ORDER — OXYCODONE HCL 5 MG PO TABS
5.0000 mg | ORAL_TABLET | Freq: Three times a day (TID) | ORAL | 0 refills | Status: DC | PRN
Start: 2023-05-09 — End: 2023-05-23

## 2023-05-09 MED ORDER — HEPARIN SOD (PORK) LOCK FLUSH 100 UNIT/ML IV SOLN: 500.0000 [IU] | Freq: Once | INTRAVENOUS | Status: DC | PRN

## 2023-05-09 NOTE — Progress Notes (Unsigned)
[facility]   Telephone:(336) [phone] Fax:(336) [phone]   Clinic Follow up Note   Patient Care Team: Pcp, No as PCP - General Pickenpack-Cousar, Arty Baumgartner, NP as Nurse Practitioner (Nurse Practitioner) Malachy Mood, MD as Consulting Physician (Hematology)  Date of Service:  05/09/2023  CHIEF COMPLAINT: f/u of metastatic rectal cancer   CURRENT THERAPY:  Second-line FOLFIRI, q2weeks, started 02/22/22 -Bevacizumab started 11/24/21  ASSESSMENT:  Jorge [name] is a 72 y.o. 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 Staging MRI showed early extra mesorectal lymph node involvement.  -baseline CEA 571.84 on 09/06/21.  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 Xeloda dose reduced due to elevated liver enzymes.  -due to new liver lesions, we switched to second-line FOLFIRI, with continued beva, on 02/21/22. He tolerates well overall.  - his recent restaging CT scan from 11/21/2022 showed stable lung and liver metastasis, no new lesions.   -Will continue current chemotherapy.  We previously discussed maintenance irinotecan alone, since he is tolerating FOLFIRI well, will continue for now. - restaging CT scan on April 22 showed stable disease overall. -I reviewed his CT of abdomen pelvis which was obtained on May 02, 2023 during his ED visit for  hematuria, it showed stable disease overall. -He is clinically doing well, diarrhea is well-controlled, no other significant side effect from treatment.  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 He is on gabapentin and cymbalta, taking 1oxycodone a day.  He is followed by NP Lowella Bandy.       PLAN: -lab reviewed -proceed with Folfiri + beva today -lab /flush and treatment 7/18, will order CT chest on next visit   SUMMARY OF ONCOLOGIC HISTORY: Oncology History Overview Note  Cancer Staging Rectal cancer [facility]) Staging form: Colon and Rectum, AJCC 8th Edition - Clinical stage from 08/29/2021: [name], cM1 - Signed by Malachy Mood, MD on 08/29/2021    Rectal cancer (HCC)  07/23/2021 Imaging   CT AP  IMPRESSION: 1. Appearance of the rectum likely indicates a rectal mass suspicious for rectal carcinoma. Inflammatory process would be less likely. Direct visualization is suggested. 2. Cholelithiasis without evidence of acute cholecystitis. 3. Aortic atherosclerosis.   08/19/2021 Procedure   Colonoscopy, under Dr. Meridee Score  Impression: - Rectal tenderness, palpable rectal mass and hemorrhoids found on digital rectal exam. - Stool in the entire examined colon - lavaged with still inadequate clearance. - One 35 mm polyp at the hepatic flexure. Biopsied. Tattooed distal in case future endoscopic resection is considered - however, has larger issues with rectal mass currently as below. - Four, 5 to 11 mm polyps in the transverse colon and at the hepatic flexure, removed with a cold snare. Resected and retrieved. - Diverticulosis in the recto-sigmoid colon and in the sigmoid colon. - Rule out malignancy, partially obstructing tumor in the rectum and from 6 to 13 cm proximal to the anus. Biopsied.   08/19/2021 Pathology Results   Diagnosis 1. Hepatic Flexure Biopsy - TUBULAR ADENOMA WITHOUT HIGH-GRADE DYSPLASIA OR MALIGNANCY 2. Transverse Colon Biopsy, and hepatic flexure, polyps (4) - TUBULAR ADENOMA WITHOUT HIGH-GRADE DYSPLASIA OR MALIGNANCY - OTHER FRAGMENTS OF POLYPOID COLONIC MUCOSA WITH NO SPECIFIC HISTOPATHOLOGIC  CHANGES - FOOD MATERIAL 3. Rectum, biopsy - ADENOCARCINOMA. SEE NOTE   08/22/2021 Initial Diagnosis   Rectal cancer  (HCC)   08/26/2021 Imaging   IMPRESSION: 1. A 2.2 x 1.9 cm right middle lobe pulmonary nodule as well as a total of four right upper lobe pulmonary nodule and masses measuring up to 3.6 cm. Findings concerning for metastatic primary lung cancer versus less likely metastases in a patient with rectal cancer. Additional imaging evaluation or consultation with Pulmonology or Thoracic Surgery recommended. 2. No gross hilar adenopathy, noting limited sensitivity for the detection of hilar adenopathy on this noncontrast study. 3. Cholelithiasis. 4.  Emphysema (ICD10-J43.9). 5. At least left anterior descending coronary artery calcifications.   08/27/2021 Imaging   IMPRESSION: Rectal adenocarcinoma T stage: T4 B   Rectal adenocarcinoma N stage: N2 disease likely associated with early extra mesorectal lymph node involvement.   Distance from tumor to the internal anal sphincter is 1.2 cm.   Also with extramural venous involvement as described.   08/29/2021 Cancer Staging   Staging form: Colon and Rectum, AJCC 8th Edition - Clinical stage from 08/29/2021: [name], cM1 - Signed by Malachy Mood, MD on 08/29/2021 Stage prefix: Initial diagnosis   01/23/2022 Imaging   CT CAP w contrast IMPRESSION: 1. Mild interval decrease in size of multiple right-sided pulmonary nodules. No new suspicious pulmonary nodule or mass. 2. Interval development of approximately 10 new ill-defined hypoattenuating liver lesions ranging in size from approximately 5-10 mm. Imaging features suspicious for metastatic disease. MRI abdomen with and without contrast may prove helpful to further evaluate. 3. Similar appearance of wall thickening in the rectum with perirectal edema. 4. Cholelithiasis. 5. Prostatomegaly. 6. Aortic Atherosclerosis (ICD10-I70.0).   11/21/2022 Imaging    IMPRESSION: 1. Stable exam. No new or progressive interval findings. 2. The primary rectosigmoid lesion described previously is not well seen  on the current study. There is presacral and perirectal edema, likely treatment related. 3. No substantial change in appearance of bilateral pulmonary metastases. 4. Stable tiny hypodensities in both hepatic lobes. These were previously characterized as metastatic disease. No new liver lesions on today's exam. 5. Cholelithiasis. 6. 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 He was last seen by me on 04/26/2023. He presents to the clinic alone. Pt state that he went to the ED for Hematuria. Pt state that he seen blood for two days, pt state he has recovered. Pt state that he had  nausea and vomiting as well. Pt state that his appetite is pretty good.       All other systems were reviewed with the patient and are negative.  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    SURGICAL HISTORY: Past Surgical History:  Procedure Laterality Date   BRONCHIAL BIOPSY  09/27/2021  Procedure: BRONCHIAL BIOPSIES;  Surgeon: Josephine Igo, DO;  Location: [facility];  Service: Pulmonary;;   BRONCHIAL BRUSHINGS  09/27/2021   Procedure: BRONCHIAL BRUSHINGS;  Surgeon: Josephine Igo, DO;  Location: [facility];  Service: Pulmonary;;   BRONCHIAL NEEDLE ASPIRATION BIOPSY  09/27/2021   Procedure: BRONCHIAL NEEDLE ASPIRATION  BIOPSIES;  Surgeon: Josephine Igo, DO;  Location: [facility];  Service: Pulmonary;;   COLONOSCOPY     IR IMAGING GUIDED PORT INSERTION  10/24/2021   left breast surgery Left 1968   VIDEO BRONCHOSCOPY WITH RADIAL ENDOBRONCHIAL ULTRASOUND  09/27/2021   Procedure: RADIAL ENDOBRONCHIAL ULTRASOUND;  Surgeon: Josephine Igo, DO;  Location: [facility];  Service: Pulmonary;;    I have reviewed the social history and family history with the patient and they are unchanged from previous note.  ALLERGIES:  has No Known Allergies.  MEDICATIONS:  Current Outpatient Medications  Medication Sig Dispense Refill   amLODipine (NORVASC) 10 MG tablet TAKE 1 TABLET BY MOUTH EVERY DAY 30 tablet 1   amLODipine (NORVASC) 5 MG tablet TAKE 1 TABLET (5 MG TOTAL) BY MOUTH DAILY. 30 tablet 2   carbonyl iron (FEOSOL) 45 MG TABS tablet Take 45 mg by mouth daily.     cefpodoxime (VANTIN) 200 MG tablet Take 1 tablet (200 mg total) by mouth 2 (two) times daily for 10 days. 20 tablet 0   diphenoxylate-atropine (LOMOTIL) 2.5-0.025 MG tablet Take 1-2 tablets by mouth 4 (four) times daily as needed for diarrhea or loose stools. 120 tablet 1   gabapentin (NEURONTIN) 100 MG capsule Take 2 capsules (200 mg total) by mouth 3 (three) times daily. 180 capsule 1   Multiple Vitamin (MULTIVITAMIN WITH MINERALS) TABS tablet Take 1 tablet by mouth daily.     ondansetron (ZOFRAN) 8 MG tablet Take 1 tablet (8 mg total) by mouth every 8 (eight) hours as needed for nausea or vomiting. 30 tablet 3   oxyCODONE (OXY IR/ROXICODONE) 5 MG immediate release tablet Take 1 tablet (5 mg total) by mouth every 8 (eight) hours as needed for severe pain or breakthrough pain. 45 tablet 0   prochlorperazine (COMPAZINE) 10 MG tablet Take 1 tablet (10 mg total) by mouth every 6 (six) hours as needed (Nausea or vomiting). 30 tablet 1   tamsulosin (FLOMAX) 0.4 MG CAPS capsule Take 1 capsule (0.4 mg total) by mouth daily. 30 capsule 0   No current  facility-administered medications for this visit.    PHYSICAL EXAMINATION: ECOG PERFORMANCE STATUS: 1 - Symptomatic but completely ambulatory  Vitals:   05/09/23 0912  BP: (!) 155/72  Pulse: 82  Resp: 18  Temp: 98 F (36.7 C)  SpO2: 99%   Wt Readings from Last 3 Encounters:  05/09/23 143 lb 4.8 oz (65 kg)  05/02/23 143 lb 14.4 oz (65.3 kg)  04/26/23 143 lb 14.4 oz (65.3 kg)     GENERAL:alert, no distress and comfortable SKIN: skin color normal, no rashes or significant lesions EYES: normal, Conjunctiva are pink and non-injected, sclera clear  NEURO: alert & oriented x 3 with fluent speech  LABORATORY DATA:  I have reviewed the data as listed    Latest Ref Rng & Units 05/09/2023    8:49 AM 05/02/2023    2:21 PM 05/02/2023    2:05 PM  CBC  WBC 4.0 - 10.5 K/uL 6.4   20.3   Hemoglobin 13.0 - 17.0 g/dL 40.9  81.1  91.4   Hematocrit 39.0 - 52.0 % 37.8  43.0  41.2   Platelets 150 - 400 K/uL 211   241         Latest Ref Rng & Units 05/02/2023    2:21 PM 05/02/2023    2:05 PM 04/26/2023    8:57 AM  CMP  Glucose 70 - 99 mg/dL 71  68  657   BUN 8 - 23 mg/dL 9  11  7    Creatinine 0.61 - 1.24 mg/dL 8.46  9.62  9.52   Sodium 135 - 145 mmol/L 138  135  139   Potassium 3.5 - 5.1 mmol/L 3.4  3.5  3.8   Chloride 98 - 111 mmol/L 98  100  105   CO2 22 - 32 mmol/L  27  29   Calcium 8.9 - 10.3 mg/dL  8.4  8.4   Total Protein 6.5 - 8.1 g/dL  7.7  6.2   Total Bilirubin 0.3 - 1.2 mg/dL  0.5  0.4   Alkaline Phos 38 - 126 U/L  237  122   AST 15 - 41 U/L  31  17   ALT 0 - 44 U/L  24  13       RADIOGRAPHIC STUDIES: I have personally reviewed the radiological images as listed and agreed with the findings in the report. No results found.    No orders of the defined types were placed in this encounter.  All questions were answered. The patient knows to call the clinic with any problems, questions or concerns. No barriers to learning was detected. The total time spent in the  appointment was 30 minutes.     Malachy Mood, MD 05/09/2023   Carolin Coy, CMA, am acting as scribe for Malachy Mood, MD.   I have reviewed the above documentation for accuracy and completeness, and I agree with the above.

## 2023-05-10 ENCOUNTER — Encounter: Payer: Self-pay | Admitting: Hematology

## 2023-05-11 ENCOUNTER — Other Ambulatory Visit: Payer: Self-pay

## 2023-05-11 ENCOUNTER — Inpatient Hospital Stay: Payer: Medicare Other

## 2023-05-11 VITALS — BP 122/72 | HR 78 | Temp 97.7°F | Resp 18

## 2023-05-11 DIAGNOSIS — C7800 Secondary malignant neoplasm of unspecified lung: Secondary | ICD-10-CM

## 2023-05-11 DIAGNOSIS — Z5112 Encounter for antineoplastic immunotherapy: Secondary | ICD-10-CM | POA: Diagnosis not present

## 2023-05-11 DIAGNOSIS — Z95828 Presence of other vascular implants and grafts: Secondary | ICD-10-CM

## 2023-05-11 MED ORDER — HEPARIN SOD (PORK) LOCK FLUSH 100 UNIT/ML IV SOLN
500.0000 [IU] | Freq: Once | INTRAVENOUS | Status: AC
  Administered 2023-05-11: 500 [IU]

## 2023-05-11 MED ORDER — PEGFILGRASTIM-JMDB 6 MG/0.6ML ~~LOC~~ SOSY
6.0000 mg | PREFILLED_SYRINGE | Freq: Once | SUBCUTANEOUS | Status: AC
  Administered 2023-05-11: 6 mg via SUBCUTANEOUS
  Filled 2023-05-11: qty 0.6

## 2023-05-11 MED ORDER — SODIUM CHLORIDE 0.9% FLUSH
10.0000 mL | Freq: Once | INTRAVENOUS | Status: AC
  Administered 2023-05-11: 10 mL

## 2023-05-23 ENCOUNTER — Other Ambulatory Visit: Payer: Self-pay | Admitting: Nurse Practitioner

## 2023-05-23 ENCOUNTER — Encounter: Payer: Self-pay | Admitting: Hematology

## 2023-05-23 ENCOUNTER — Telehealth: Payer: Self-pay | Admitting: Hematology

## 2023-05-23 DIAGNOSIS — Z515 Encounter for palliative care: Secondary | ICD-10-CM

## 2023-05-23 DIAGNOSIS — R197 Diarrhea, unspecified: Secondary | ICD-10-CM

## 2023-05-23 DIAGNOSIS — G893 Neoplasm related pain (acute) (chronic): Secondary | ICD-10-CM

## 2023-05-23 DIAGNOSIS — C20 Malignant neoplasm of rectum: Secondary | ICD-10-CM

## 2023-05-23 MED ORDER — OXYCODONE HCL 5 MG PO TABS
5.0000 mg | ORAL_TABLET | Freq: Three times a day (TID) | ORAL | 0 refills | Status: DC | PRN
Start: 2023-05-23 — End: 2023-06-06

## 2023-05-23 MED ORDER — DIPHENOXYLATE-ATROPINE 2.5-0.025 MG PO TABS
1.0000 | ORAL_TABLET | Freq: Four times a day (QID) | ORAL | 1 refills | Status: DC | PRN
Start: 2023-05-23 — End: 2023-06-19

## 2023-05-23 MED FILL — Dexamethasone Sodium Phosphate Inj 100 MG/10ML: INTRAMUSCULAR | Qty: 1 | Status: AC

## 2023-05-23 NOTE — Telephone Encounter (Signed)
See attached request. He is scheduled for treatment and to see Dr. Mosetta Putt tomorrow. Thanks

## 2023-05-24 ENCOUNTER — Inpatient Hospital Stay: Payer: Medicare Other

## 2023-05-24 ENCOUNTER — Inpatient Hospital Stay: Payer: Medicare Other | Admitting: Hematology

## 2023-05-25 ENCOUNTER — Other Ambulatory Visit: Payer: Self-pay | Admitting: Hematology

## 2023-05-26 ENCOUNTER — Inpatient Hospital Stay: Payer: Medicare Other

## 2023-05-29 NOTE — Progress Notes (Unsigned)
Palliative Medicine [facility]  Telephone:(336) [phone] Fax:(336) [phone]   Name: Jorge [name] Date: 05/29/2023 MRN: [id number]  DOB: 03-18-1951  Patient Care Team: Pcp, No as PCP - General Pickenpack-Cousar, Arty Baumgartner, NP as Nurse Practitioner (Nurse Practitioner) Malachy Mood, MD as Consulting Physician (Hematology)   INTERVAL HISTORY: Jorge [name] is a 72 y.o. 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  Palliative ask to see for symptom management and goals of care.   SOCIAL HISTORY:     reports that he has been smoking cigarettes. He has a 5 pack-year smoking history. He has never used smokeless tobacco. He reports that he does not currently use alcohol. He reports that he does not use drugs.  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  MEDICATIONS:  Current Outpatient Medications  Medication Sig Dispense Refill   amLODipine (NORVASC) 10 MG tablet TAKE 1 TABLET BY MOUTH EVERY DAY 30 tablet 1   amLODipine (NORVASC) 5 MG tablet TAKE 1 TABLET (5 MG TOTAL) BY MOUTH DAILY. 30 tablet 2   carbonyl iron (FEOSOL) 45 MG TABS tablet Take 45 mg by mouth daily.     diphenoxylate-atropine (LOMOTIL) 2.5-0.025 MG tablet Take 1-2 tablets by mouth 4 (four) times daily as needed for diarrhea or loose stools. 120 tablet 1   gabapentin (NEURONTIN) 100 MG capsule Take 2 capsules (200 mg total) by mouth 3 (three) times daily. 180 capsule 1   Multiple Vitamin (MULTIVITAMIN WITH MINERALS) TABS tablet Take 1 tablet by mouth daily.     ondansetron (ZOFRAN) 8 MG tablet Take 1 tablet (8 mg total) by mouth every 8 (eight) hours as needed for nausea or vomiting. 30 tablet 3   oxyCODONE (OXY IR/ROXICODONE) 5 MG immediate release  tablet Take 1 tablet (5 mg total) by mouth every 8 (eight) hours as needed for severe pain or breakthrough pain. 45 tablet 0   prochlorperazine (COMPAZINE) 10 MG tablet Take 1 tablet (10 mg total) by mouth every 6 (six) hours as needed (Nausea or vomiting). 30 tablet 1   tamsulosin (FLOMAX) 0.4 MG CAPS capsule Take 1 capsule (0.4 mg total) by mouth daily. 30 capsule 0   No current facility-administered medications for this visit.    VITAL SIGNS: There were no vitals taken for this visit. There were no vitals filed for this visit.   Estimated body mass index is 19.43 kg/m as calculated from the following:   Height as of 05/09/23: 6' (1.829 m).   Weight as of 05/09/23: 143 lb 4.8 oz (65 kg).   PERFORMANCE STATUS (ECOG) : 1 - Symptomatic but completely ambulatory  Assessment NAD, ambulatory RRR Normal breathing pattern  AAO x3, mood appropriate    IMPRESSION:  I saw Jorge [name] during his infusion. No acute distress. Doing well overall. Denies nausea, vomiting, constipation. States he had some diarrhea on last week however this was controlled with Lomotil. Shares he enjoyed his trip to see family in [place]. His son is now relocating back here to [facility]. Also shares excitement of his granddaughter preparing for her Freshman year at [employer].   Neuropathic Pain  Jorge [name] reports his pain is well controlled on current regimen. Some days continue to be better than others.  Tolerating gabapentin and oxycodone with noticeable improvement to his to neuropathic pain.    No changes to current regimen.   Will continue to closely monitor and support as needed.   PLAN:  Gabapentin 300mg  three times daily.   Oxy IR 5mg  every 8 hours as needed. Not requiring around the clock.  I will plan to see patient back in 3-4 weeks in collaboration with his oncology appointments.   Patient expressed understanding and was in agreement with this plan. He also understands that He can call the clinic at any time with  any questions, concerns, or complaints.     Any controlled substances utilized were prescribed in the context of palliative care. PDMP has been reviewed.    Visit consisted of counseling and education dealing with the complex and emotionally intense issues of symptom management and palliative care in the setting of serious and potentially life-threatening illness.Greater than 50%  of this time was spent counseling and coordinating care related to the above assessment and plan.  Willette Alma, AGPCNP-BC  Palliative Medicine Team/[facility]  *Please note that this is a verbal dictation therefore any spelling or grammatical errors are due to the "Dragon Medical One" system interpretation.

## 2023-05-30 NOTE — Assessment & Plan Note (Signed)
[id number] with lung metastasis, MMR normal, KRAS(+) [id number]  -diagnosed 08/2021 by colonoscopy for rectal bleeding and frequent BM. Staging MRI showed early extra mesorectal lymph node involvement.  -baseline CEA 571.84 on 09/06/21.  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 Xeloda dose reduced due to elevated liver enzymes.  -due to new liver lesions, we switched to second-line FOLFIRI, with continued beva, on 02/21/22. He tolerates well overall.  - restaging CT scan on April 22 showed stable disease overall. -He is clinically doing well, diarrhea is well-controlled, no other significant side effect from treatment.   -Will continue current chemotherapy.  We previously discussed maintenance irinotecan alone, since he is tolerating FOLFIRI well, will continue for now.

## 2023-05-31 ENCOUNTER — Inpatient Hospital Stay (HOSPITAL_BASED_OUTPATIENT_CLINIC_OR_DEPARTMENT_OTHER): Payer: Medicare Other | Admitting: Nurse Practitioner

## 2023-05-31 ENCOUNTER — Other Ambulatory Visit: Payer: Self-pay

## 2023-05-31 ENCOUNTER — Encounter: Payer: Self-pay | Admitting: Nurse Practitioner

## 2023-05-31 ENCOUNTER — Inpatient Hospital Stay: Payer: Medicare Other

## 2023-05-31 ENCOUNTER — Inpatient Hospital Stay (HOSPITAL_BASED_OUTPATIENT_CLINIC_OR_DEPARTMENT_OTHER): Payer: Medicare Other | Admitting: Hematology

## 2023-05-31 ENCOUNTER — Encounter: Payer: Self-pay | Admitting: Hematology

## 2023-05-31 VITALS — BP 158/80 | HR 78 | Temp 98.0°F | Resp 18 | Ht 72.0 in | Wt 143.8 lb

## 2023-05-31 VITALS — BP 144/75 | HR 71 | Resp 17

## 2023-05-31 DIAGNOSIS — R530 Neoplastic (malignant) related fatigue: Secondary | ICD-10-CM | POA: Diagnosis not present

## 2023-05-31 DIAGNOSIS — T451X5A Adverse effect of antineoplastic and immunosuppressive drugs, initial encounter: Secondary | ICD-10-CM

## 2023-05-31 DIAGNOSIS — G620 Drug-induced polyneuropathy: Secondary | ICD-10-CM

## 2023-05-31 DIAGNOSIS — C7800 Secondary malignant neoplasm of unspecified lung: Secondary | ICD-10-CM | POA: Diagnosis not present

## 2023-05-31 DIAGNOSIS — C20 Malignant neoplasm of rectum: Secondary | ICD-10-CM | POA: Diagnosis not present

## 2023-05-31 DIAGNOSIS — Z5112 Encounter for antineoplastic immunotherapy: Secondary | ICD-10-CM | POA: Diagnosis not present

## 2023-05-31 DIAGNOSIS — Z515 Encounter for palliative care: Secondary | ICD-10-CM

## 2023-05-31 DIAGNOSIS — G893 Neoplasm related pain (acute) (chronic): Secondary | ICD-10-CM

## 2023-05-31 DIAGNOSIS — Z95828 Presence of other vascular implants and grafts: Secondary | ICD-10-CM

## 2023-05-31 LAB — CBC WITH DIFFERENTIAL (CANCER CENTER ONLY)
Abs Immature Granulocytes: 0.01 10*3/uL (ref 0.00–0.07)
Basophils Absolute: 0.1 10*3/uL (ref 0.0–0.1)
Basophils Relative: 1 %
Eosinophils Absolute: 0.2 10*3/uL (ref 0.0–0.5)
Eosinophils Relative: 4 %
HCT: 40 % (ref 39.0–52.0)
Hemoglobin: 12.6 g/dL — ABNORMAL LOW (ref 13.0–17.0)
Immature Granulocytes: 0 %
Lymphocytes Relative: 26 %
Lymphs Abs: 1.1 10*3/uL (ref 0.7–4.0)
MCH: 27.9 pg (ref 26.0–34.0)
MCHC: 31.5 g/dL (ref 30.0–36.0)
MCV: 88.5 fL (ref 80.0–100.0)
Monocytes Absolute: 0.9 10*3/uL (ref 0.1–1.0)
Monocytes Relative: 20 %
Neutro Abs: 2.1 10*3/uL (ref 1.7–7.7)
Neutrophils Relative %: 49 %
Platelet Count: 147 10*3/uL — ABNORMAL LOW (ref 150–400)
RBC: 4.52 MIL/uL (ref 4.22–5.81)
RDW: 19.1 % — ABNORMAL HIGH (ref 11.5–15.5)
WBC Count: 4.3 10*3/uL (ref 4.0–10.5)
nRBC: 0 % (ref 0.0–0.2)

## 2023-05-31 LAB — CMP (CANCER CENTER ONLY)
ALT: 96 U/L — ABNORMAL HIGH (ref 0–44)
AST: 124 U/L — ABNORMAL HIGH (ref 15–41)
Albumin: 3.4 g/dL — ABNORMAL LOW (ref 3.5–5.0)
Alkaline Phosphatase: 157 U/L — ABNORMAL HIGH (ref 38–126)
Anion gap: 3 — ABNORMAL LOW (ref 5–15)
BUN: 9 mg/dL (ref 8–23)
CO2: 29 mmol/L (ref 22–32)
Calcium: 8.9 mg/dL (ref 8.9–10.3)
Chloride: 106 mmol/L (ref 98–111)
Creatinine: 0.66 mg/dL (ref 0.61–1.24)
GFR, Estimated: >60 mL/min (ref >60)
Glucose, Bld: 92 mg/dL (ref 70–99)
Potassium: 4.1 mmol/L (ref 3.5–5.1)
Sodium: 138 mmol/L (ref 135–145)
Total Bilirubin: 0.4 mg/dL (ref 0.3–1.2)
Total Protein: 6.7 g/dL (ref 6.5–8.1)

## 2023-05-31 LAB — TOTAL PROTEIN, URINE DIPSTICK: Protein, ur: 30 mg/dL — AB

## 2023-05-31 MED ORDER — SODIUM CHLORIDE 0.9 % IV SOLN
10.0000 mg | Freq: Once | INTRAVENOUS | Status: AC
  Administered 2023-05-31: 10 mg via INTRAVENOUS
  Filled 2023-05-31: qty 10

## 2023-05-31 MED ORDER — SODIUM CHLORIDE 0.9 % IV SOLN
400.0000 mg/m2 | Freq: Once | INTRAVENOUS | Status: AC
  Administered 2023-05-31: 740 mg via INTRAVENOUS
  Filled 2023-05-31: qty 37

## 2023-05-31 MED ORDER — SODIUM CHLORIDE 0.9% FLUSH
10.0000 mL | Freq: Once | INTRAVENOUS | Status: AC
  Administered 2023-05-31: 10 mL

## 2023-05-31 MED ORDER — ATROPINE SULFATE 1 MG/ML IV SOLN
0.5000 mg | Freq: Once | INTRAVENOUS | Status: AC | PRN
  Administered 2023-05-31: 0.5 mg via INTRAVENOUS

## 2023-05-31 MED ORDER — SODIUM CHLORIDE 0.9 % IV SOLN
5.0000 mg/kg | Freq: Once | INTRAVENOUS | Status: AC
  Administered 2023-05-31: 350 mg via INTRAVENOUS
  Filled 2023-05-31: qty 14

## 2023-05-31 MED ORDER — SODIUM CHLORIDE 0.9 % IV SOLN: Freq: Once | INTRAVENOUS | Status: AC

## 2023-05-31 MED ORDER — SODIUM CHLORIDE 0.9 % IV SOLN
140.0000 mg/m2 | Freq: Once | INTRAVENOUS | Status: AC
  Administered 2023-05-31: 260 mg via INTRAVENOUS
  Filled 2023-05-31: qty 1.33

## 2023-05-31 MED ORDER — SODIUM CHLORIDE 0.9 % IV SOLN
10.0000 mg | Freq: Once | INTRAVENOUS | Status: DC
  Filled 2023-05-31: qty 1

## 2023-05-31 MED ORDER — PALONOSETRON HCL INJECTION 0.25 MG/5ML
0.2500 mg | Freq: Once | INTRAVENOUS | Status: AC
  Administered 2023-05-31: 0.25 mg via INTRAVENOUS
  Filled 2023-05-31: qty 5

## 2023-05-31 MED ORDER — SODIUM CHLORIDE 0.9 % IV SOLN
2400.0000 mg/m2 | INTRAVENOUS | Status: DC
  Administered 2023-05-31: 4450 mg via INTRAVENOUS
  Filled 2023-05-31: qty 89

## 2023-05-31 NOTE — Patient Instructions (Signed)
[place] [facility]  Discharge Instructions: Thank you for choosing [facility] to provide your oncology and hematology care.   If you have a lab appointment with the Cancer Center, please go directly to the Cancer Center and check in at the registration area.   Wear comfortable clothing and clothing appropriate for easy access to any Portacath or PICC line.   We strive to give you quality time with your provider. You may need to reschedule your appointment if you arrive late (15 or more minutes).  Arriving late affects you and other patients whose appointments are after yours.  Also, if you miss three or more appointments without notifying the office, you may be dismissed from the clinic at the provider's discretion.      For prescription refill requests, have your pharmacy contact our office and allow 72 hours for refills to be completed.    Today you received the following chemotherapy and/or immunotherapy agents: bevacizumab/irinotecan/leucovorin/5FU.   To help prevent nausea and vomiting after your treatment, we encourage you to take your nausea medication as directed.  BELOW ARE SYMPTOMS THAT SHOULD BE REPORTED IMMEDIATELY: *FEVER GREATER THAN 100.4 F (38 C) OR HIGHER *CHILLS OR SWEATING *NAUSEA AND VOMITING THAT IS NOT CONTROLLED WITH YOUR NAUSEA MEDICATION *UNUSUAL SHORTNESS OF BREATH *UNUSUAL BRUISING OR BLEEDING *URINARY PROBLEMS (pain or burning when urinating, or frequent urination) *BOWEL PROBLEMS (unusual diarrhea, constipation, pain near the anus) TENDERNESS IN MOUTH AND THROAT WITH OR WITHOUT PRESENCE OF ULCERS (sore throat, sores in mouth, or a toothache) UNUSUAL RASH, SWELLING OR PAIN  UNUSUAL VAGINAL DISCHARGE OR ITCHING   Items with * indicate a potential emergency and should be followed up as soon as possible or go to the Emergency Department if any problems should occur.  Please show the CHEMOTHERAPY ALERT CARD or  IMMUNOTHERAPY ALERT CARD at check-in to the Emergency Department and triage nurse.  Should you have questions after your visit or need to cancel or reschedule your appointment, please contact [place] [facility]  Dept: [phone]  and follow the prompts.  Office hours are 8:00 a.m. to 4:30 p.m. Monday - Friday. Please note that voicemails left after 4:00 p.m. may not be returned until the following business day.  We are closed weekends and major holidays. You have access to a nurse at all times for urgent questions. Please call the main number to the clinic Dept: [phone] and follow the prompts.   For any non-urgent questions, you may also contact your provider using MyChart. We now offer e-Visits for anyone 95 and older to request care online for non-urgent symptoms. For details visit [email or web address].   Also download the MyChart app! Go to the app store, search "MyChart", open the app, select [place], and log in with your MyChart username and password.

## 2023-05-31 NOTE — Progress Notes (Signed)
Patient seen by Dr. America Brown are within treatment parameters.  Labs reviewed: and are not all within treatment parameters.    AST 124 and ALT 96.   Per physician team, patient is ready for treatment and there are NO modifications to the treatment plan.

## 2023-05-31 NOTE — Progress Notes (Signed)
Per Dr. Mosetta Putt, okay to treat with AST 124 and ALT 96.

## 2023-05-31 NOTE — Progress Notes (Signed)
Jorge [name]   [phone] Fax:([phone]   Clinic Follow up Note   Patient Care Team: Jorge [name] as PCP - General Pickenpack-Cousar, Arty Baumgartner, NP as Nurse Practitioner (Nurse Practitioner) Jorge Mood, MD as Consulting Physician (Hematology)  Date of Service:  05/31/2023  CHIEF COMPLAINT: f/u of  metastatic rectal cance   CURRENT THERAPY:  Second-line FOLFIRI, q2weeks, started 02/22/22 -Bevacizumab started 11/24/21  ASSESSMENT:  Jorge [name] is a 72 y.o. 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 Staging MRI showed early extra mesorectal lymph node involvement.  -baseline CEA 571.84 on 09/06/21.  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 Xeloda dose reduced due to elevated liver enzymes.  -due to new liver lesions, we switched to second-line FOLFIRI, with continued beva, on 02/21/22. He tolerates well overall.  - restaging CT scan on April 22 showed stable disease overall. -He is clinically doing well, diarrhea is well-controlled, [name] other significant side effect from treatment.   -Will continue current chemotherapy.  We previously discussed maintenance irinotecan alone, since he is tolerating FOLFIRI well, will continue for now.     PLAN: -lab reviewed -proceed with [place] today -atropine given before chemo due to his diarrhea right after his chemo infusion last time  -repeat CT scan in Sept. -lab/flush and treatment 8/8  SUMMARY OF ONCOLOGIC HISTORY: Oncology History Overview Note  Cancer Staging Rectal cancer [facility]) Staging form: Colon and Rectum, AJCC 8th Edition - Clinical stage from 08/29/2021: Jorge [name], cM1 - Signed by Jorge Mood, MD on 08/29/2021    Rectal cancer (HCC)   07/23/2021 Imaging   CT AP  IMPRESSION: 1. Appearance of the rectum likely indicates a rectal mass suspicious for rectal carcinoma. Inflammatory process would be less likely. Direct visualization is suggested. 2. Cholelithiasis without evidence of acute cholecystitis. 3. Aortic atherosclerosis.   08/19/2021 Procedure   Colonoscopy, under Dr. Meridee [name]  Impression: - Rectal tenderness, palpable rectal mass and hemorrhoids found on digital rectal exam. - Stool in the entire examined colon - lavaged with still inadequate clearance. - One 35 mm polyp at the hepatic flexure. Biopsied. Tattooed distal in case future endoscopic resection is considered - however, has larger issues with rectal mass currently as below. - Four, 5 to 11 mm polyps in the transverse colon and at the hepatic flexure, removed with a cold snare. Resected and retrieved. - Diverticulosis in the recto-sigmoid colon and in the sigmoid colon. - Rule out malignancy, partially obstructing tumor in the rectum and from 6 to 13 cm proximal to the anus. Biopsied.   08/19/2021 Pathology Results   Diagnosis 1. Hepatic Flexure Biopsy - TUBULAR ADENOMA WITHOUT HIGH-GRADE DYSPLASIA OR MALIGNANCY 2. Transverse Colon Biopsy, and hepatic flexure, polyps (4) - TUBULAR ADENOMA WITHOUT HIGH-GRADE DYSPLASIA OR MALIGNANCY - OTHER FRAGMENTS OF POLYPOID COLONIC MUCOSA WITH [name] SPECIFIC HISTOPATHOLOGIC CHANGES - FOOD MATERIAL 3. Rectum, biopsy - ADENOCARCINOMA. SEE NOTE   08/22/2021 Initial Diagnosis   Rectal cancer (HCC)   08/26/2021 Imaging   IMPRESSION: 1. A 2.2 x 1.9 cm right middle lobe pulmonary nodule as well as a total of four right upper lobe pulmonary nodule and masses measuring up to 3.6 cm. Findings concerning for metastatic primary lung cancer versus less likely metastases in a patient with rectal cancer. Additional  imaging evaluation or consultation with Pulmonology or Thoracic Surgery recommended. 2. [name] gross hilar  adenopathy, noting limited sensitivity for the detection of hilar adenopathy on this noncontrast study. 3. Cholelithiasis. 4.  Emphysema (ICD10-J43.9). 5. At least left anterior descending coronary artery calcifications.   08/27/2021 Imaging   IMPRESSION: Rectal adenocarcinoma T stage: T4 B   Rectal adenocarcinoma N stage: N2 disease likely associated with early extra mesorectal lymph node involvement.   Distance from tumor to the internal anal sphincter is 1.2 cm.   Also with extramural venous involvement as described.   08/29/2021 Cancer Staging   Staging form: Colon and Rectum, AJCC 8th Edition - Clinical stage from 08/29/2021: Jorge [name], cM1 - Signed by Jorge Mood, MD on 08/29/2021 Stage prefix: Initial diagnosis   01/23/2022 Imaging   CT CAP w contrast IMPRESSION: 1. Mild interval decrease in size of multiple right-sided pulmonary nodules. [name] new suspicious pulmonary nodule or mass. 2. Interval development of approximately 10 new ill-defined hypoattenuating liver lesions ranging in size from approximately 5-10 mm. Imaging features suspicious for metastatic disease. MRI abdomen with and without contrast may prove helpful to further evaluate. 3. Similar appearance of wall thickening in the rectum with perirectal edema. 4. Cholelithiasis. 5. Prostatomegaly. 6. Aortic Atherosclerosis (ICD10-I70.0).   11/21/2022 Imaging    IMPRESSION: 1. Stable exam. [name] new or progressive interval findings. 2. The primary rectosigmoid lesion described previously is not well seen on the current study. There is presacral and perirectal edema, likely treatment related. 3. [name] substantial change in appearance of bilateral pulmonary metastases. 4. Stable tiny hypodensities in both hepatic lobes. These were previously characterized as metastatic disease. [name] new liver lesions on today's exam. 5. Cholelithiasis. 6. 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 He was last seen by me on 05/09/2023. He presents to the clinic alone. Pt state he is doing well. He reports of [name] BM issues. He states that his neuropathy is about the same and he takes Gabapentin for relief.     All other systems were reviewed with the patient and are negative.  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    SURGICAL HISTORY: Past Surgical History:  Procedure Laterality Date   BRONCHIAL BIOPSY  09/27/2021   Procedure: BRONCHIAL BIOPSIES;  Surgeon: Josephine Igo, DO;  Location: [facility];  Service: Pulmonary;;   BRONCHIAL BRUSHINGS  09/27/2021   Procedure: BRONCHIAL BRUSHINGS;  Surgeon: Josephine Igo, DO;  Location: [facility];  Service: Pulmonary;;   BRONCHIAL NEEDLE ASPIRATION BIOPSY  09/27/2021   Procedure: BRONCHIAL NEEDLE ASPIRATION BIOPSIES;  Surgeon: Josephine Igo, DO;  Location: [facility];  Service: Pulmonary;;   COLONOSCOPY     IR IMAGING GUIDED PORT INSERTION  10/24/2021   left breast surgery Left 1968   VIDEO BRONCHOSCOPY  WITH RADIAL ENDOBRONCHIAL ULTRASOUND  09/27/2021   Procedure: RADIAL ENDOBRONCHIAL ULTRASOUND;  Surgeon: Josephine Igo, DO;  Location: [facility];  Service: Pulmonary;;    I have reviewed the social history and family history with the patient and they are unchanged from previous  note.  ALLERGIES:  has [name] Known Allergies.  MEDICATIONS:  Current Outpatient Medications  Medication Sig Dispense Refill   amLODipine (NORVASC) 10 MG tablet TAKE 1 TABLET BY MOUTH EVERY DAY 30 tablet 1   amLODipine (NORVASC) 5 MG tablet TAKE 1 TABLET (5 MG TOTAL) BY MOUTH DAILY. 30 tablet 2   carbonyl iron (FEOSOL) 45 MG TABS tablet Take 45 mg by mouth daily.     diphenoxylate-atropine (LOMOTIL) 2.5-0.025 MG tablet Take 1-2 tablets by mouth 4 (four) times daily as needed for diarrhea or loose stools. 120 tablet 1   gabapentin (NEURONTIN) 100 MG capsule Take 2 capsules (200 mg total) by mouth 3 (three) times daily. 180 capsule 1   Multiple Vitamin (MULTIVITAMIN WITH MINERALS) TABS tablet Take 1 tablet by mouth daily.     ondansetron (ZOFRAN) 8 MG tablet Take 1 tablet (8 mg total) by mouth every 8 (eight) hours as needed for nausea or vomiting. 30 tablet 3   oxyCODONE (OXY IR/ROXICODONE) 5 MG immediate release tablet Take 1 tablet (5 mg total) by mouth every 8 (eight) hours as needed for severe pain or breakthrough pain. 45 tablet 0   prochlorperazine (COMPAZINE) 10 MG tablet Take 1 tablet (10 mg total) by mouth every 6 (six) hours as needed (Nausea or vomiting). 30 tablet 1   tamsulosin (FLOMAX) 0.4 MG CAPS capsule Take 1 capsule (0.4 mg total) by mouth daily. 30 capsule 0   [name] current facility-administered medications for this visit.   Facility-Administered Medications Ordered in Other Visits  Medication Dose Route Frequency Provider Last Rate Last Admin   fluorouracil (ADRUCIL) 4,450 mg in sodium chloride 0.9 % 61 mL chemo infusion  2,400 mg/m2 (Treatment Plan Recorded) Intravenous 1 day or 1 dose Jorge Mood, MD       irinotecan (CAMPTOSAR) 260 mg in sodium chloride 0.9 % 500 mL chemo infusion  140 mg/m2 (Treatment Plan Recorded) Intravenous Once Jorge Mood, MD 342 mL/hr at 05/31/23 1111 260 mg at 05/31/23 1111   leucovorin 740 mg in sodium chloride 0.9 % 250 mL infusion  400 mg/m2 (Treatment  Plan Recorded) Intravenous Once Jorge Mood, MD 191 mL/hr at 05/31/23 1107 740 mg at 05/31/23 1107    PHYSICAL EXAMINATION: ECOG PERFORMANCE STATUS: 1 - Symptomatic but completely ambulatory  Vitals:   05/31/23 0918  BP: (!) 158/80  Pulse: 78  Resp: 18  Temp: 98 F (36.7 C)  SpO2: 100%   Wt Readings from Last 3 Encounters:  05/31/23 143 lb 12.8 oz (65.2 kg)  05/09/23 143 lb 4.8 oz (65 kg)  05/02/23 143 lb 14.4 oz (65.3 kg)     GENERAL:alert, [name] distress and comfortable SKIN: skin color normal, [name] rashes or significant lesions EYES: normal, Conjunctiva are pink and non-injected, sclera clear  NEURO: alert & oriented x 3 with fluent speech LABORATORY DATA:  I have reviewed the data as listed    Latest Ref Rng & Units 05/31/2023    8:54 AM 05/09/2023    8:49 AM 05/02/2023    2:21 PM  CBC  WBC 4.0 - 10.5 K/uL 4.3  6.4    Hemoglobin 13.0 - 17.0 g/dL 16.1  09.6  04.5   Hematocrit 39.0 - 52.0 % 40.0  37.8  43.0   Platelets 150 - 400 K/uL 147  211          Latest Ref Rng & Units 05/31/2023    8:54 AM 05/09/2023    8:49 AM 05/02/2023    2:21 PM  CMP  Glucose 70 - 99 mg/dL 92  657  71   BUN 8 - 23 mg/dL 9  9  9    Creatinine 0.61 - 1.24 mg/dL 8.46  9.62  9.52   Sodium 135 - 145 mmol/L 138  137  138   Potassium 3.5 - 5.1 mmol/L 4.1  3.8  3.4   Chloride 98 - 111 mmol/L 106  103  98   CO2 22 - 32 mmol/L 29  31    Calcium 8.9 - 10.3 mg/dL 8.9  8.5    Total Protein 6.5 - 8.1 g/dL 6.7  6.3    Total Bilirubin 0.3 - 1.2 mg/dL 0.4  0.3    Alkaline Phos 38 - 126 U/L 157  154    AST 15 - 41 U/L 124  22    ALT 0 - 44 U/L 96  17        RADIOGRAPHIC STUDIES: I have personally reviewed the radiological images as listed and agreed with the findings in the report. [name] results found.    [name] orders of the defined types were placed in this encounter.  All questions were answered. The patient knows to call the clinic with any problems, questions or concerns. [name] barriers to learning was  detected. The total time spent in the appointment was 25 minutes.     Jorge Mood, MD 05/31/2023   Carolin Coy, CMA, am acting as scribe for Jorge Mood, MD.   I have reviewed the above documentation for accuracy and completeness, and I agree with the above.

## 2023-06-02 ENCOUNTER — Inpatient Hospital Stay: Payer: Medicare Other

## 2023-06-02 VITALS — BP 125/81 | HR 76 | Temp 98.0°F | Resp 18

## 2023-06-02 DIAGNOSIS — C7800 Secondary malignant neoplasm of unspecified lung: Secondary | ICD-10-CM

## 2023-06-02 DIAGNOSIS — Z5112 Encounter for antineoplastic immunotherapy: Secondary | ICD-10-CM | POA: Diagnosis not present

## 2023-06-02 MED ORDER — SODIUM CHLORIDE 0.9% FLUSH
10.0000 mL | INTRAVENOUS | Status: DC | PRN
  Administered 2023-06-02: 10 mL

## 2023-06-02 MED ORDER — PEGFILGRASTIM-JMDB 6 MG/0.6ML ~~LOC~~ SOSY
6.0000 mg | PREFILLED_SYRINGE | Freq: Once | SUBCUTANEOUS | Status: AC
  Administered 2023-06-02: 6 mg via SUBCUTANEOUS

## 2023-06-02 MED ORDER — HEPARIN SOD (PORK) LOCK FLUSH 100 UNIT/ML IV SOLN
500.0000 [IU] | Freq: Once | INTRAVENOUS | Status: AC | PRN
  Administered 2023-06-02: 500 [IU]

## 2023-06-06 ENCOUNTER — Other Ambulatory Visit: Payer: Self-pay | Admitting: Nurse Practitioner

## 2023-06-06 DIAGNOSIS — C7800 Secondary malignant neoplasm of unspecified lung: Secondary | ICD-10-CM

## 2023-06-06 DIAGNOSIS — G893 Neoplasm related pain (acute) (chronic): Secondary | ICD-10-CM

## 2023-06-06 DIAGNOSIS — Z515 Encounter for palliative care: Secondary | ICD-10-CM

## 2023-06-06 MED ORDER — OXYCODONE HCL 5 MG PO TABS
5.0000 mg | ORAL_TABLET | Freq: Three times a day (TID) | ORAL | 0 refills | Status: DC | PRN
Start: 2023-06-06 — End: 2023-06-20

## 2023-06-07 ENCOUNTER — Other Ambulatory Visit: Payer: Medicare Other

## 2023-06-07 ENCOUNTER — Ambulatory Visit: Payer: Medicare Other

## 2023-06-07 ENCOUNTER — Ambulatory Visit: Payer: Medicare Other | Admitting: Nurse Practitioner

## 2023-06-13 ENCOUNTER — Ambulatory Visit: Payer: Medicare Other

## 2023-06-13 ENCOUNTER — Other Ambulatory Visit: Payer: Medicare Other

## 2023-06-13 ENCOUNTER — Ambulatory Visit: Payer: Medicare Other | Admitting: Physician Assistant

## 2023-06-14 ENCOUNTER — Encounter: Payer: Self-pay | Admitting: Hematology

## 2023-06-18 MED FILL — Dexamethasone Sodium Phosphate Inj 100 MG/10ML: INTRAMUSCULAR | Qty: 1 | Status: AC

## 2023-06-18 NOTE — Assessment & Plan Note (Signed)
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 He is on gabapentin and cymbalta, taking 1oxycodone a day. He is followed by NP Nikki.    

## 2023-06-18 NOTE — Progress Notes (Deleted)
Palliative Medicine [facility]  Telephone:([phone] [phone]) [phone]   Name: Jorge [name] Date: 06/18/2023 MRN: [id number]  DOB: 1951/03/30  Patient Care Team: Pcp, No as PCP - General Pickenpack-Cousar, Arty Baumgartner, NP as Nurse Practitioner (Nurse Practitioner) Malachy Mood, MD as Consulting Physician (Hematology)   INTERVAL HISTORY: Jorge [name] is a 72 y.o. 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  Palliative ask to see for symptom management and goals of care.   SOCIAL HISTORY:     reports that he has been smoking cigarettes. He has a 5 pack-year smoking history. He has never used smokeless tobacco. He reports that he does not currently use alcohol. He reports that he does not use drugs.  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  MEDICATIONS:  Current Outpatient Medications  Medication Sig Dispense Refill   amLODipine (NORVASC) 10 MG tablet TAKE 1 TABLET BY MOUTH EVERY DAY 30 tablet 1   amLODipine (NORVASC) 5 MG tablet TAKE 1 TABLET (5 MG TOTAL) BY MOUTH DAILY. 30 tablet 2   carbonyl iron (FEOSOL) 45 MG TABS tablet Take 45 mg by mouth daily.     diphenoxylate-atropine (LOMOTIL) 2.5-0.025 MG tablet Take 1-2 tablets by mouth 4 (four) times daily as needed for diarrhea or loose stools. 120 tablet 1   gabapentin (NEURONTIN) 100 MG capsule Take 2 capsules (200 mg total) by mouth 3 (three) times daily. 180 capsule 1   Multiple Vitamin (MULTIVITAMIN WITH MINERALS) TABS tablet Take 1 tablet by mouth daily.     ondansetron (ZOFRAN) 8 MG tablet Take 1 tablet (8 mg total) by mouth every 8 (eight) hours as needed for nausea or vomiting. 30 tablet 3   oxyCODONE (OXY IR/ROXICODONE) 5 MG immediate release  tablet Take 1 tablet (5 mg total) by mouth every 8 (eight) hours as needed for severe pain or breakthrough pain. 45 tablet 0   prochlorperazine (COMPAZINE) 10 MG tablet Take 1 tablet (10 mg total) by mouth every 6 (six) hours as needed (Nausea or vomiting). 30 tablet 1   No current facility-administered medications for this visit.    VITAL SIGNS: There were no vitals taken for this visit. There were no vitals filed for this visit.   Estimated body mass index is 19.5 kg/m as calculated from the following:   Height as of 05/31/23: 6' (1.829 m).   Weight as of 05/31/23: 143 lb 12.8 oz (65.2 kg).   PERFORMANCE STATUS (ECOG) : 1 - Symptomatic but completely ambulatory  Assessment NAD, ambulatory RRR Normal breathing pattern  AAO x3, mood appropriate    IMPRESSION:   Neuropathic Pain  [name] reports his pain is well controlled on current regimen. Some days continue to be better than others. Tolerating gabapentin and oxycodone with noticeable improvement to his to neuropathic pain.    No changes to current regimen.   Will continue to closely monitor and support as needed.   PLAN:  Gabapentin 300mg  three times daily.   Oxy IR 5mg  every 8 hours as needed. Not requiring around the clock.  I will plan to see patient back in 3-4 weeks in collaboration with his oncology appointments.   Patient expressed understanding and was in agreement with this plan. He  also understands that He can call the clinic at any time with any questions, concerns, or complaints.     Any controlled substances utilized were prescribed in the context of palliative care. PDMP has been reviewed.    Visit consisted of counseling and education dealing with the complex and emotionally intense issues of symptom management and palliative care in the setting of serious and potentially life-threatening illness.Greater than 50%  of this time was spent counseling and coordinating care related to the above assessment  and plan.  Willette Alma, AGPCNP-BC  Palliative Medicine Team/[facility]  *Please note that this is a verbal dictation therefore any spelling or grammatical errors are due to the "Dragon Medical One" system interpretation.

## 2023-06-18 NOTE — Assessment & Plan Note (Signed)
[id number] with lung metastasis, MMR normal, KRAS(+) [id number]  -diagnosed 08/2021 by colonoscopy for rectal bleeding and frequent BM. Staging MRI showed early extra mesorectal lymph node involvement.  -baseline CEA 571.84 on 09/06/21.  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 Xeloda dose reduced due to elevated liver enzymes.  -due to new liver lesions, we switched to second-line FOLFIRI, with continued beva, on 02/21/22. He tolerates well overall.  - restaging CT scan on April 22 and June 26 showed stable disease overall. -He is clinically doing well, diarrhea is well-controlled, no other significant side effect from treatment.   -Will continue current chemotherapy.  We previously discussed maintenance irinotecan alone, since he is tolerating FOLFIRI well, will continue for now.

## 2023-06-19 ENCOUNTER — Inpatient Hospital Stay: Payer: Medicare Other | Attending: Physician Assistant

## 2023-06-19 ENCOUNTER — Encounter: Payer: Self-pay | Admitting: Hematology

## 2023-06-19 ENCOUNTER — Inpatient Hospital Stay (HOSPITAL_BASED_OUTPATIENT_CLINIC_OR_DEPARTMENT_OTHER): Payer: Medicare Other | Admitting: Hematology

## 2023-06-19 ENCOUNTER — Inpatient Hospital Stay: Payer: Medicare Other | Admitting: Nurse Practitioner

## 2023-06-19 ENCOUNTER — Other Ambulatory Visit: Payer: Self-pay

## 2023-06-19 ENCOUNTER — Inpatient Hospital Stay: Payer: Medicare Other

## 2023-06-19 VITALS — BP 166/87 | HR 65 | Temp 98.6°F | Resp 20 | Ht 72.0 in | Wt 140.2 lb

## 2023-06-19 DIAGNOSIS — C7800 Secondary malignant neoplasm of unspecified lung: Secondary | ICD-10-CM | POA: Diagnosis not present

## 2023-06-19 DIAGNOSIS — G620 Drug-induced polyneuropathy: Secondary | ICD-10-CM

## 2023-06-19 DIAGNOSIS — Z5189 Encounter for other specified aftercare: Secondary | ICD-10-CM | POA: Diagnosis not present

## 2023-06-19 DIAGNOSIS — C20 Malignant neoplasm of rectum: Secondary | ICD-10-CM | POA: Diagnosis not present

## 2023-06-19 DIAGNOSIS — C775 Secondary and unspecified malignant neoplasm of intrapelvic lymph nodes: Secondary | ICD-10-CM | POA: Insufficient documentation

## 2023-06-19 DIAGNOSIS — Z5111 Encounter for antineoplastic chemotherapy: Secondary | ICD-10-CM | POA: Diagnosis present

## 2023-06-19 DIAGNOSIS — C7801 Secondary malignant neoplasm of right lung: Secondary | ICD-10-CM | POA: Insufficient documentation

## 2023-06-19 DIAGNOSIS — Z5112 Encounter for antineoplastic immunotherapy: Secondary | ICD-10-CM | POA: Insufficient documentation

## 2023-06-19 DIAGNOSIS — T451X5A Adverse effect of antineoplastic and immunosuppressive drugs, initial encounter: Secondary | ICD-10-CM

## 2023-06-19 DIAGNOSIS — Z515 Encounter for palliative care: Secondary | ICD-10-CM

## 2023-06-19 DIAGNOSIS — R197 Diarrhea, unspecified: Secondary | ICD-10-CM | POA: Diagnosis not present

## 2023-06-19 LAB — CMP (CANCER CENTER ONLY)
ALT: 31 U/L (ref 0–44)
AST: 35 U/L (ref 15–41)
Albumin: 3.3 g/dL — ABNORMAL LOW (ref 3.5–5.0)
Alkaline Phosphatase: 128 U/L — ABNORMAL HIGH (ref 38–126)
Anion gap: 2 — ABNORMAL LOW (ref 5–15)
BUN: 7 mg/dL — ABNORMAL LOW (ref 8–23)
CO2: 30 mmol/L (ref 22–32)
Calcium: 8.6 mg/dL — ABNORMAL LOW (ref 8.9–10.3)
Chloride: 106 mmol/L (ref 98–111)
Creatinine: 0.61 mg/dL (ref 0.61–1.24)
GFR, Estimated: >60 mL/min (ref >60)
Glucose, Bld: 98 mg/dL (ref 70–99)
Potassium: 4.2 mmol/L (ref 3.5–5.1)
Sodium: 138 mmol/L (ref 135–145)
Total Bilirubin: 0.5 mg/dL (ref 0.3–1.2)
Total Protein: 6.8 g/dL (ref 6.5–8.1)

## 2023-06-19 LAB — TOTAL PROTEIN, URINE DIPSTICK: Protein, ur: 100 mg/dL — AB

## 2023-06-19 LAB — CBC WITH DIFFERENTIAL (CANCER CENTER ONLY)
Abs Immature Granulocytes: 0 10*3/uL (ref 0.00–0.07)
Basophils Absolute: 0.1 10*3/uL (ref 0.0–0.1)
Basophils Relative: 1 %
Eosinophils Absolute: 0.2 10*3/uL (ref 0.0–0.5)
Eosinophils Relative: 4 %
HCT: 41.5 % (ref 39.0–52.0)
Hemoglobin: 13.5 g/dL (ref 13.0–17.0)
Immature Granulocytes: 0 %
Lymphocytes Relative: 24 %
Lymphs Abs: 1 10*3/uL (ref 0.7–4.0)
MCH: 28.8 pg (ref 26.0–34.0)
MCHC: 32.5 g/dL (ref 30.0–36.0)
MCV: 88.7 fL (ref 80.0–100.0)
Monocytes Absolute: 0.8 10*3/uL (ref 0.1–1.0)
Monocytes Relative: 20 %
Neutro Abs: 2.1 10*3/uL (ref 1.7–7.7)
Neutrophils Relative %: 51 %
Platelet Count: 151 10*3/uL (ref 150–400)
RBC: 4.68 MIL/uL (ref 4.22–5.81)
RDW: 18.2 % — ABNORMAL HIGH (ref 11.5–15.5)
WBC Count: 4.1 10*3/uL (ref 4.0–10.5)
nRBC: 0 % (ref 0.0–0.2)

## 2023-06-19 MED ORDER — SODIUM CHLORIDE 0.9 % IV SOLN: Freq: Once | INTRAVENOUS | Status: AC

## 2023-06-19 MED ORDER — SODIUM CHLORIDE 0.9 % IV SOLN
400.0000 mg/m2 | Freq: Once | INTRAVENOUS | Status: AC
  Administered 2023-06-19: 740 mg via INTRAVENOUS
  Filled 2023-06-19: qty 25

## 2023-06-19 MED ORDER — SODIUM CHLORIDE 0.9% FLUSH: 10.0000 mL | INTRAVENOUS | Status: DC | PRN

## 2023-06-19 MED ORDER — SODIUM CHLORIDE 0.9 % IV SOLN
140.0000 mg/m2 | Freq: Once | INTRAVENOUS | Status: AC
  Administered 2023-06-19: 260 mg via INTRAVENOUS
  Filled 2023-06-19: qty 13

## 2023-06-19 MED ORDER — DIPHENOXYLATE-ATROPINE 2.5-0.025 MG PO TABS: 1.0000 | ORAL_TABLET | Freq: Four times a day (QID) | ORAL | 1 refills | Status: DC | PRN

## 2023-06-19 MED ORDER — ATROPINE SULFATE 1 MG/ML IV SOLN
0.4000 mg | Freq: Once | INTRAVENOUS | Status: AC
  Administered 2023-06-19: 0.4 mg via INTRAVENOUS
  Filled 2023-06-19: qty 1

## 2023-06-19 MED ORDER — AMLODIPINE BESYLATE 10 MG PO TABS: 10.0000 mg | ORAL_TABLET | Freq: Every day | ORAL | 1 refills | Status: DC

## 2023-06-19 MED ORDER — SODIUM CHLORIDE 0.9 % IV SOLN
2400.0000 mg/m2 | INTRAVENOUS | Status: DC
  Administered 2023-06-19: 4450 mg via INTRAVENOUS
  Filled 2023-06-19: qty 89

## 2023-06-19 MED ORDER — PALONOSETRON HCL INJECTION 0.25 MG/5ML
0.2500 mg | Freq: Once | INTRAVENOUS | Status: AC
  Administered 2023-06-19: 0.25 mg via INTRAVENOUS
  Filled 2023-06-19: qty 5

## 2023-06-19 MED ORDER — SODIUM CHLORIDE 0.9 % IV SOLN
10.0000 mg | Freq: Once | INTRAVENOUS | Status: AC
  Administered 2023-06-19: 10 mg via INTRAVENOUS
  Filled 2023-06-19: qty 10

## 2023-06-19 MED ORDER — SODIUM CHLORIDE 0.9 % IV SOLN
5.0000 mg/kg | Freq: Once | INTRAVENOUS | Status: AC
  Administered 2023-06-19: 350 mg via INTRAVENOUS
  Filled 2023-06-19: qty 14

## 2023-06-19 MED ORDER — HEPARIN SOD (PORK) LOCK FLUSH 100 UNIT/ML IV SOLN: 500.0000 [IU] | Freq: Once | INTRAVENOUS | Status: DC | PRN

## 2023-06-19 NOTE — Patient Instructions (Addendum)
[facility]  Discharge Instructions: Thank you for choosing [facility] to provide your oncology and hematology care.   If you have a lab appointment with the Cancer Center, please go directly to the Cancer Center and check in at the registration area.   Wear comfortable clothing and clothing appropriate for easy access to any Portacath or PICC line.   We strive to give you quality time with your provider. You may need to reschedule your appointment if you arrive late (15 or more minutes).  Arriving late affects you and other patients whose appointments are after yours.  Also, if you miss three or more appointments without notifying the office, you may be dismissed from the clinic at the provider's discretion.      For prescription refill requests, have your pharmacy contact our office and allow 72 hours for refills to be completed.    Today you received the following chemotherapy and/or immunotherapy agents: Bevacizumab, Irinotecan, Leucovorin, and Fluorouracil.   To help prevent nausea and vomiting after your treatment, we encourage you to take your nausea medication as directed.  BELOW ARE SYMPTOMS THAT SHOULD BE REPORTED IMMEDIATELY: *FEVER GREATER THAN 100.4 F (38 C) OR HIGHER *CHILLS OR SWEATING *NAUSEA AND VOMITING THAT IS NOT CONTROLLED WITH YOUR NAUSEA MEDICATION *UNUSUAL SHORTNESS OF BREATH *UNUSUAL BRUISING OR BLEEDING *URINARY PROBLEMS (pain or burning when urinating, or frequent urination) *BOWEL PROBLEMS (unusual diarrhea, constipation, pain near the anus) TENDERNESS IN MOUTH AND THROAT WITH OR WITHOUT PRESENCE OF ULCERS (sore throat, sores in mouth, or a toothache) UNUSUAL RASH, SWELLING OR PAIN  UNUSUAL VAGINAL DISCHARGE OR ITCHING   Items with * indicate a potential emergency and should be followed up as soon as possible or go to the Emergency Department if any problems should occur.  Please show the CHEMOTHERAPY ALERT  CARD or IMMUNOTHERAPY ALERT CARD at check-in to the Emergency Department and triage nurse.  Should you have questions after your visit or need to cancel or reschedule your appointment, please contact [facility]  Dept: [phone]  and follow the prompts.  Office hours are 8:00 a.m. to 4:30 p.m. Monday - Friday. Please note that voicemails left after 4:00 p.m. may not be returned until the following business day.  We are closed weekends and major holidays. You have access to a nurse at all times for urgent questions. Please call the main number to the clinic Dept: [phone] and follow the prompts.   For any non-urgent questions, you may also contact your provider using MyChart. We now offer e-Visits for anyone 40 and older to request care online for non-urgent symptoms. For details visit [email or web address].   Also download the MyChart app! Go to the app store, search "MyChart", open the app, select [facility], and log in with your MyChart username and password. The chemotherapy medication bag should finish at 46 hours, 96 hours, or 7 days. For example, if your pump is scheduled for 46 hours and it was put on at 4:00 p.m., it should finish at 2:00 p.m. the day it is scheduled to come off regardless of your appointment time.     Estimated time to finish at: 12:30 PM   If the display on your pump reads "Low Volume" and it is beeping, take the batteries out of the pump and come to the cancer center for it to be taken off.   If the pump alarms go off prior to  the pump reading "Low Volume" then call [phone] and someone can assist you.  If the plunger comes out and the chemotherapy medication is leaking out, please use your home chemo spill kit to clean up the spill. Do NOT use paper towels or other household products.  If you have problems or questions regarding your pump, please call either [phone] (24 hours a day) or the cancer center  Monday-Friday 8:00 a.m.- 4:30 p.m. at the clinic number and we will assist you. If you are unable to get assistance, then go to the nearest Emergency Department and ask the staff to contact the IV team for assistance.

## 2023-06-19 NOTE — Progress Notes (Signed)
Patient seen by Dr. America Brown are  177/90  Labs reviewed:  urine protein 100   Per physician team, patient is ready for treatment and there are NO modifications to the treatment plan.

## 2023-06-19 NOTE — Progress Notes (Signed)
[facility]   [phone] [phone]   Clinic Follow up Note   Patient Care Team: Pcp, No as PCP - General Pickenpack-Cousar, Arty Baumgartner, NP as Nurse Practitioner (Nurse Practitioner) Malachy Mood, MD as Consulting Physician (Hematology)  Date of Service:  06/19/2023  CHIEF COMPLAINT: f/u of metastatic rectal cancer    CURRENT THERAPY:  Second-line FOLFIRI, q2weeks, started 02/22/22 -Bevacizumab started 11/24/21 ASSESSMENT:  Jorge [name] is a 72 y.o. 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 Staging MRI showed early extra mesorectal lymph node involvement.  -baseline CEA 571.84 on 09/06/21.  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 Xeloda dose reduced due to elevated liver enzymes.  -due to new liver lesions, we switched to second-line FOLFIRI, with continued beva, on 02/21/22. He tolerates well overall.  - restaging CT scan on April 22 and June 26 showed stable disease overall. -He is clinically doing well, diarrhea is well-controlled, no other significant side effect from treatment.   -Will continue current chemotherapy.  We previously discussed maintenance irinotecan alone, since he is tolerating FOLFIRI well, will continue for now.   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 He is on gabapentin and cymbalta, taking 1oxycodone a day. He is followed by NP Lowella Bandy.        PLAN: - I refill AMLODIPINE. - I refill Lomotil -proceed with treatment today -lab/flush and treatment 8/28   SUMMARY OF ONCOLOGIC HISTORY: Oncology History Overview Note  Cancer Staging Rectal cancer [facility]) Staging form: Colon and Rectum, AJCC 8th Edition - Clinical  stage from 08/29/2021: [name], cM1 - Signed by Malachy Mood, MD on 08/29/2021    Rectal cancer (HCC)  07/23/2021 Imaging   CT AP  IMPRESSION: 1. Appearance of the rectum likely indicates a rectal mass suspicious for rectal carcinoma. Inflammatory process would be less likely. Direct visualization is suggested. 2. Cholelithiasis without evidence of acute cholecystitis. 3. Aortic atherosclerosis.   08/19/2021 Procedure   Colonoscopy, under Dr. Meridee Score  Impression: - Rectal tenderness, palpable rectal mass and hemorrhoids found on digital rectal exam. - Stool in the entire examined colon - lavaged with still inadequate clearance. - One 35 mm polyp at the hepatic flexure. Biopsied. Tattooed distal in case future endoscopic resection is considered - however, has larger issues with rectal mass currently as below. - Four, 5 to 11 mm polyps in the transverse colon and at the hepatic flexure, removed with a cold snare. Resected and retrieved. - Diverticulosis in the recto-sigmoid colon and in the sigmoid colon. - Rule out malignancy, partially obstructing tumor in the rectum and from 6 to 13 cm proximal to the anus. Biopsied.   08/19/2021 Pathology Results   Diagnosis 1. Hepatic Flexure Biopsy - TUBULAR ADENOMA WITHOUT HIGH-GRADE DYSPLASIA OR MALIGNANCY 2. Transverse Colon Biopsy, and hepatic flexure, polyps (4) - TUBULAR ADENOMA WITHOUT HIGH-GRADE DYSPLASIA OR MALIGNANCY - OTHER FRAGMENTS OF POLYPOID COLONIC MUCOSA WITH NO SPECIFIC HISTOPATHOLOGIC CHANGES - FOOD MATERIAL 3. Rectum, biopsy - ADENOCARCINOMA. SEE NOTE   08/22/2021 Initial Diagnosis   Rectal cancer (HCC)   08/26/2021 Imaging   IMPRESSION: 1. A 2.2 x 1.9 cm right middle lobe pulmonary nodule as well as a total of four right upper lobe pulmonary nodule and masses measuring up to 3.6  cm. Findings concerning for metastatic primary lung cancer versus less likely metastases in a patient with rectal cancer. Additional imaging  evaluation or consultation with Pulmonology or Thoracic Surgery recommended. 2. No gross hilar adenopathy, noting limited sensitivity for the detection of hilar adenopathy on this noncontrast study. 3. Cholelithiasis. 4.  Emphysema (ICD10-J43.9). 5. At least left anterior descending coronary artery calcifications.   08/27/2021 Imaging   IMPRESSION: Rectal adenocarcinoma T stage: T4 B   Rectal adenocarcinoma N stage: N2 disease likely associated with early extra mesorectal lymph node involvement.   Distance from tumor to the internal anal sphincter is 1.2 cm.   Also with extramural venous involvement as described.   08/29/2021 Cancer Staging   Staging form: Colon and Rectum, AJCC 8th Edition - Clinical stage from 08/29/2021: [name], cM1 - Signed by Malachy Mood, MD on 08/29/2021 Stage prefix: Initial diagnosis   01/23/2022 Imaging   CT CAP w contrast IMPRESSION: 1. Mild interval decrease in size of multiple right-sided pulmonary nodules. No new suspicious pulmonary nodule or mass. 2. Interval development of approximately 10 new ill-defined hypoattenuating liver lesions ranging in size from approximately 5-10 mm. Imaging features suspicious for metastatic disease. MRI abdomen with and without contrast may prove helpful to further evaluate. 3. Similar appearance of wall thickening in the rectum with perirectal edema. 4. Cholelithiasis. 5. Prostatomegaly. 6. Aortic Atherosclerosis (ICD10-I70.0).   11/21/2022 Imaging    IMPRESSION: 1. Stable exam. No new or progressive interval findings. 2. The primary rectosigmoid lesion described previously is not well seen on the current study. There is presacral and perirectal edema, likely treatment related. 3. No substantial change in appearance of bilateral pulmonary metastases. 4. Stable tiny hypodensities in both hepatic lobes. These were previously characterized as metastatic disease. No new liver lesions on today's exam. 5.  Cholelithiasis. 6. 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 He was last seen by me on 05/31/2023. He presents to the clinic alone. Pt state that he has no issue with last treatment. Pt state that he has some diarrhea and he is taking Lomotil.      All other systems were reviewed with the patient and are negative.  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    SURGICAL HISTORY: Past Surgical History:  Procedure Laterality Date   BRONCHIAL BIOPSY  09/27/2021   Procedure: BRONCHIAL BIOPSIES;  Surgeon: Josephine Igo, DO;  Location: [facility];  Service: Pulmonary;;   BRONCHIAL BRUSHINGS  09/27/2021   Procedure: BRONCHIAL BRUSHINGS;  Surgeon: Josephine Igo, DO;  Location: [facility];  Service: Pulmonary;;   BRONCHIAL NEEDLE ASPIRATION BIOPSY  09/27/2021   Procedure: BRONCHIAL NEEDLE ASPIRATION BIOPSIES;  Surgeon: Josephine Igo, DO;  Location: [facility];  Service: Pulmonary;;   COLONOSCOPY     IR IMAGING GUIDED  PORT INSERTION  10/24/2021   left breast surgery Left 1968   VIDEO BRONCHOSCOPY WITH RADIAL ENDOBRONCHIAL ULTRASOUND  09/27/2021   Procedure: RADIAL ENDOBRONCHIAL ULTRASOUND;  Surgeon: Josephine Igo, DO;  Location: [facility];  Service: Pulmonary;;    I have reviewed the social history and  family history with the patient and they are unchanged from previous note.  ALLERGIES:  has No Known Allergies.  MEDICATIONS:  Current Outpatient Medications  Medication Sig Dispense Refill   amLODipine (NORVASC) 10 MG tablet Take 1 tablet (10 mg total) by mouth daily. 90 tablet 1   carbonyl iron (FEOSOL) 45 MG TABS tablet Take 45 mg by mouth daily.     diphenoxylate-atropine (LOMOTIL) 2.5-0.025 MG tablet Take 1-2 tablets by mouth 4 (four) times daily as needed for diarrhea or loose stools. 120 tablet 1   gabapentin (NEURONTIN) 100 MG capsule Take 2 capsules (200 mg total) by mouth 3 (three) times daily. 180 capsule 1   Multiple Vitamin (MULTIVITAMIN WITH MINERALS) TABS tablet Take 1 tablet by mouth daily.     ondansetron (ZOFRAN) 8 MG tablet Take 1 tablet (8 mg total) by mouth every 8 (eight) hours as needed for nausea or vomiting. 30 tablet 3   oxyCODONE (OXY IR/ROXICODONE) 5 MG immediate release tablet Take 1 tablet (5 mg total) by mouth every 8 (eight) hours as needed for severe pain or breakthrough pain. 45 tablet 0   prochlorperazine (COMPAZINE) 10 MG tablet Take 1 tablet (10 mg total) by mouth every 6 (six) hours as needed (Nausea or vomiting). 30 tablet 1   No current facility-administered medications for this visit.   Facility-Administered Medications Ordered in Other Visits  Medication Dose Route Frequency Provider Last Rate Last Admin   atropine injection 0.4 mg  0.4 mg Intravenous Once Malachy Mood, MD       bevacizumab-adcd [facility]) 350 mg in sodium chloride 0.9 % 100 mL chemo infusion  5 mg/kg (Treatment Plan Recorded) Intravenous Once Malachy Mood, MD       dexamethasone (DECADRON) 10 mg in sodium chloride 0.9 % 50 mL IVPB  10 mg Intravenous Once Malachy Mood, MD       fluorouracil (ADRUCIL) 4,450 mg in sodium chloride 0.9 % 61 mL chemo infusion  2,400 mg/m2 (Treatment Plan Recorded) Intravenous 1 day or 1 dose Malachy Mood, MD       heparin lock flush 100 unit/mL  500 Units  Intracatheter Once PRN Malachy Mood, MD       irinotecan (CAMPTOSAR) 260 mg in sodium chloride 0.9 % 500 mL chemo infusion  140 mg/m2 (Treatment Plan Recorded) Intravenous Once Malachy Mood, MD       leucovorin 740 mg in sodium chloride 0.9 % 250 mL infusion  400 mg/m2 (Treatment Plan Recorded) Intravenous Once Malachy Mood, MD       sodium chloride flush (NS) 0.9 % injection 10 mL  10 mL Intracatheter PRN Malachy Mood, MD        PHYSICAL EXAMINATION: ECOG PERFORMANCE STATUS: 1 - Symptomatic but completely ambulatory  There were no vitals filed for this visit. Wt Readings from Last 3 Encounters:  06/19/23 140 lb 4 oz (63.6 kg)  05/31/23 143 lb 12.8 oz (65.2 kg)  05/09/23 143 lb 4.8 oz (65 kg)     GENERAL:alert, no distress and comfortable SKIN: skin color normal, no rashes or significant lesions EYES: normal, Conjunctiva are pink and non-injected, sclera clear  NEURO: alert & oriented x 3 with fluent speech  LABORATORY DATA:  I have reviewed the data as listed    Latest Ref Rng & Units 06/19/2023   10:05 AM 05/31/2023    8:54 AM 05/09/2023    8:49 AM  CBC  WBC 4.0 - 10.5 K/uL 4.1  4.3  6.4   Hemoglobin 13.0 - 17.0 g/dL 44.0  34.7  42.5   Hematocrit 39.0 - 52.0 % 41.5  40.0  37.8   Platelets 150 - 400 K/uL 151  147  211         Latest Ref Rng & Units 06/19/2023   10:05 AM 05/31/2023    8:54 AM 05/09/2023    8:49 AM  CMP  Glucose 70 - 99 mg/dL 98  92  956   BUN 8 - 23 mg/dL 7  9  9    Creatinine 0.61 - 1.24 mg/dL 3.87  5.64  3.32   Sodium 135 - 145 mmol/L 138  138  137   Potassium 3.5 - 5.1 mmol/L 4.2  4.1  3.8   Chloride 98 - 111 mmol/L 106  106  103   CO2 22 - 32 mmol/L 30  29  31    Calcium 8.9 - 10.3 mg/dL 8.6  8.9  8.5   Total Protein 6.5 - 8.1 g/dL 6.8  6.7  6.3   Total Bilirubin 0.3 - 1.2 mg/dL 0.5  0.4  0.3   Alkaline Phos 38 - 126 U/L 128  157  154   AST 15 - 41 U/L 35  124  22   ALT 0 - 44 U/L 31  96  17       RADIOGRAPHIC STUDIES: I have personally reviewed the  radiological images as listed and agreed with the findings in the report. No results found.    Orders Placed This Encounter  Procedures   CBC with Differential (Cancer Center Only)    Standing Status:   Future    Standing Expiration Date:   08/01/2024   CMP (Cancer Center only)    Standing Status:   Future    Standing Expiration Date:   08/01/2024   Total Protein, Urine dipstick    Standing Status:   Future    Standing Expiration Date:   08/01/2024   All questions were answered. The patient knows to call the clinic with any problems, questions or concerns. No barriers to learning was detected. The total time spent in the appointment was 25 minutes.     Malachy Mood, MD 06/19/2023   Carolin Coy, CMA, am acting as scribe for Malachy Mood, MD.   I have reviewed the above documentation for accuracy and completeness, and I agree with the above.

## 2023-06-19 NOTE — Progress Notes (Signed)
Per Dr. Mosetta Putt, pt. instructed to follow up with PCP for elevated blood pressure. Pt. denies chest pain, dizziness, no shortness of breath noted and left via ambulation without difficulty.

## 2023-06-20 ENCOUNTER — Other Ambulatory Visit: Payer: Self-pay | Admitting: Nurse Practitioner

## 2023-06-20 ENCOUNTER — Other Ambulatory Visit: Payer: Self-pay | Admitting: Hematology and Oncology

## 2023-06-20 DIAGNOSIS — G893 Neoplasm related pain (acute) (chronic): Secondary | ICD-10-CM

## 2023-06-20 DIAGNOSIS — Z515 Encounter for palliative care: Secondary | ICD-10-CM

## 2023-06-20 DIAGNOSIS — C20 Malignant neoplasm of rectum: Secondary | ICD-10-CM

## 2023-06-20 MED ORDER — OXYCODONE HCL 5 MG PO TABS
5.0000 mg | ORAL_TABLET | Freq: Three times a day (TID) | ORAL | 0 refills | Status: DC | PRN
Start: 2023-06-20 — End: 2023-07-04

## 2023-06-21 ENCOUNTER — Other Ambulatory Visit: Payer: Self-pay

## 2023-06-21 ENCOUNTER — Ambulatory Visit: Payer: Medicare Other | Admitting: Hematology

## 2023-06-21 ENCOUNTER — Inpatient Hospital Stay: Payer: Medicare Other

## 2023-06-21 ENCOUNTER — Other Ambulatory Visit: Payer: Medicare Other

## 2023-06-21 ENCOUNTER — Ambulatory Visit: Payer: Medicare Other

## 2023-06-21 VITALS — BP 141/79 | HR 75 | Temp 98.8°F | Resp 18

## 2023-06-21 DIAGNOSIS — Z5112 Encounter for antineoplastic immunotherapy: Secondary | ICD-10-CM | POA: Diagnosis not present

## 2023-06-21 DIAGNOSIS — C7800 Secondary malignant neoplasm of unspecified lung: Secondary | ICD-10-CM

## 2023-06-21 MED ORDER — SODIUM CHLORIDE 0.9% FLUSH
10.0000 mL | INTRAVENOUS | Status: DC | PRN
  Administered 2023-06-21: 10 mL

## 2023-06-21 MED ORDER — PEGFILGRASTIM-JMDB 6 MG/0.6ML ~~LOC~~ SOSY
6.0000 mg | PREFILLED_SYRINGE | Freq: Once | SUBCUTANEOUS | Status: AC
  Administered 2023-06-21: 6 mg via SUBCUTANEOUS
  Filled 2023-06-21: qty 0.6

## 2023-06-21 MED ORDER — HEPARIN SOD (PORK) LOCK FLUSH 100 UNIT/ML IV SOLN
500.0000 [IU] | Freq: Once | INTRAVENOUS | Status: AC | PRN
  Administered 2023-06-21: 500 [IU]

## 2023-06-27 ENCOUNTER — Ambulatory Visit: Payer: Medicare Other

## 2023-06-27 ENCOUNTER — Ambulatory Visit: Payer: Medicare Other | Admitting: Hematology

## 2023-06-27 ENCOUNTER — Other Ambulatory Visit: Payer: Medicare Other

## 2023-07-02 NOTE — Progress Notes (Unsigned)
Patient Care Team: Pcp, No as PCP - General Jorge [name], Jorge Baumgartner, NP as Nurse Practitioner (Nurse Practitioner) Jorge Mood, MD as Consulting Physician (Hematology)   CHIEF COMPLAINT: Follow up metastatic rectal cancer   Oncology History Overview Note  Cancer Staging Rectal cancer [facility]) Staging form: Colon and Rectum, AJCC 8th Edition - Clinical stage from 08/29/2021: Jorge [name], cM1 - Signed by Jorge Mood, MD on 08/29/2021    Rectal cancer (HCC)  07/23/2021 Imaging   CT AP  IMPRESSION: 1. Appearance of the rectum likely indicates a rectal mass suspicious for rectal carcinoma. Inflammatory process would be less likely. Direct visualization is suggested. 2. Cholelithiasis without evidence of acute cholecystitis. 3. Aortic atherosclerosis.   08/19/2021 Procedure   Colonoscopy, under Dr. Meridee [name]  Impression: - Rectal tenderness, palpable rectal mass and hemorrhoids found on digital rectal exam. - Stool in the entire examined colon - lavaged with still inadequate clearance. - One 35 mm polyp at the hepatic flexure. Biopsied. Tattooed distal in case future endoscopic resection is considered - however, has larger issues with rectal mass currently as below. - Four, 5 to 11 mm polyps in the transverse colon and at the hepatic flexure, removed with a cold snare. Resected and retrieved. - Diverticulosis in the recto-sigmoid colon and in the sigmoid colon. - Rule out malignancy, partially obstructing tumor in the rectum and from 6 to 13 cm proximal to the anus. Biopsied.   08/19/2021 Pathology Results   Diagnosis 1. Hepatic Flexure Biopsy - TUBULAR ADENOMA WITHOUT HIGH-GRADE DYSPLASIA OR MALIGNANCY 2. Transverse Colon Biopsy, and hepatic flexure, polyps (4) - TUBULAR ADENOMA WITHOUT HIGH-GRADE DYSPLASIA OR MALIGNANCY - OTHER FRAGMENTS OF POLYPOID COLONIC MUCOSA WITH NO SPECIFIC HISTOPATHOLOGIC CHANGES - FOOD MATERIAL 3. Rectum, biopsy - ADENOCARCINOMA. SEE NOTE    08/22/2021 Initial Diagnosis   Rectal cancer (HCC)   08/26/2021 Imaging   IMPRESSION: 1. A 2.2 x 1.9 cm right middle lobe pulmonary nodule as well as a total of four right upper lobe pulmonary nodule and masses measuring up to 3.6 cm. Findings concerning for metastatic primary lung cancer versus less likely metastases in a patient with rectal cancer. Additional imaging evaluation or consultation with Pulmonology or Thoracic Surgery recommended. 2. No gross hilar adenopathy, noting limited sensitivity for the detection of hilar adenopathy on this noncontrast study. 3. Cholelithiasis. 4.  Emphysema (ICD10-J43.9). 5. At least left anterior descending coronary artery calcifications.   08/27/2021 Imaging   IMPRESSION: Rectal adenocarcinoma T stage: T4 B   Rectal adenocarcinoma N stage: N2 disease likely associated with early extra mesorectal lymph node involvement.   Distance from tumor to the internal anal sphincter is 1.2 cm.   Also with extramural venous involvement as described.   08/29/2021 Cancer Staging   Staging form: Colon and Rectum, AJCC 8th Edition - Clinical stage from 08/29/2021: Jorge [name], cM1 - Signed by Jorge Mood, MD on 08/29/2021 Stage prefix: Initial diagnosis   01/23/2022 Imaging   CT CAP w contrast IMPRESSION: 1. Mild interval decrease in size of multiple right-sided pulmonary nodules. No new suspicious pulmonary nodule or mass. 2. Interval development of approximately 10 new ill-defined hypoattenuating liver lesions ranging in size from approximately 5-10 mm. Imaging features suspicious for metastatic disease. MRI abdomen with and without contrast may prove helpful to further evaluate. 3. Similar appearance of wall thickening in the rectum with perirectal edema. 4. Cholelithiasis. 5. Prostatomegaly. 6. Aortic Atherosclerosis (ICD10-I70.0).   11/21/2022 Imaging    IMPRESSION: 1. Stable exam. No new or progressive  interval findings. 2. The primary  rectosigmoid lesion described previously is not well seen on the current study. There is presacral and perirectal edema, likely treatment related. 3. No substantial change in appearance of bilateral pulmonary metastases. 4. Stable tiny hypodensities in both hepatic lobes. These were previously characterized as metastatic disease. No new liver lesions on today's exam. 5. Cholelithiasis. 6. 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 returns for follow up and treatment as scheduled. Last seen by Dr. Mosetta [name] 06/19/23 with another cycle of FOLFIRI/Beva.  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  He continues taking care of his family, notes that when he gets busy forgets to stop and eat or take his meds.  Has 1 episode of diarrhea after chemo especially if he drinks milk, but better with Lactaid products.  Takes Lomotil as needed which is helpful.  Neuropathy is stable.  His denies any other new or specific concerns.  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     Past Surgical History:  Procedure Laterality Date   BRONCHIAL BIOPSY  09/27/2021   Procedure: BRONCHIAL BIOPSIES;  Surgeon: Jorge Igo, DO;  Location: [facility];   Service: Pulmonary;;   BRONCHIAL BRUSHINGS  09/27/2021   Procedure: BRONCHIAL BRUSHINGS;  Surgeon: Jorge Igo, DO;  Location: [facility];  Service: Pulmonary;;   BRONCHIAL NEEDLE ASPIRATION BIOPSY  09/27/2021   Procedure: BRONCHIAL NEEDLE ASPIRATION BIOPSIES;  Surgeon: Jorge Igo, DO;  Location: [facility];  Service: Pulmonary;;   COLONOSCOPY     IR IMAGING GUIDED PORT INSERTION  10/24/2021   left breast surgery Left 1968   VIDEO BRONCHOSCOPY WITH RADIAL ENDOBRONCHIAL ULTRASOUND  09/27/2021   Procedure: RADIAL ENDOBRONCHIAL ULTRASOUND;  Surgeon: Jorge Igo, DO;  Location: [facility];  Service: Pulmonary;;     Outpatient Encounter Medications as of 07/04/2023  Medication Sig Note   amLODipine (NORVASC) 10 MG tablet Take 1 tablet (10 mg total) by mouth daily.    carbonyl iron (FEOSOL) 45 MG TABS tablet Take 45 mg by mouth daily.    diphenoxylate-atropine (LOMOTIL) 2.5-0.025 MG tablet Take 1-2 tablets by mouth 4 (four) times daily as needed for diarrhea or loose stools.    gabapentin (NEURONTIN) 100 MG capsule Take 2 capsules (200 mg total) by mouth 3 (three) times daily.    Multiple Vitamin (MULTIVITAMIN WITH MINERALS) TABS tablet Take 1 tablet by mouth daily.    ondansetron (ZOFRAN) 8 MG tablet Take 1 tablet (8 mg total) by mouth every 8 (eight) hours as needed for nausea or vomiting.    oxyCODONE (OXY IR/ROXICODONE) 5 MG immediate release tablet Take 1 tablet (5 mg total) by mouth every 8 (eight) hours as needed for severe pain or breakthrough pain.  prochlorperazine (COMPAZINE) 10 MG tablet Take 1 tablet (10 mg total) by mouth every 6 (six) hours as needed (Nausea or vomiting).    [DISCONTINUED] ondansetron (ZOFRAN) 8 MG tablet Take 1 tablet (8 mg total) by mouth every 8 (eight) hours as needed for nausea or vomiting. 10/24/2021: Not started   No facility-administered encounter medications on file as of 07/04/2023.     Today's Vitals   07/04/23 0931 07/04/23 0937   BP: 125/82   Pulse: 66   Resp: 16   Temp: 98 F (36.7 C)   TempSrc: Oral   SpO2: 99%   Weight: 139 lb 3.2 oz (63.1 kg)   PainSc:  0-No pain   Body mass index is 18.88 kg/m.   PHYSICAL EXAM GENERAL:alert, no distress and comfortable SKIN: no rash  EYES: sclera clear NECK: without mass LYMPH:  no palpable cervical or supraclavicular lymphadenopathy  LUNGS: clear with normal breathing effort HEART: regular rate & rhythm, no lower extremity edema ABDOMEN: abdomen soft, non-tender and normal bowel sounds NEURO: alert & oriented x 3 with fluent speech, no focal motor deficits PAC without erythema    CBC    Component Value Date/Time   WBC 6.1 07/04/2023 0859   WBC 20.3 (H) 05/02/2023 1405   RBC 4.93 07/04/2023 0859   HGB 13.9 07/04/2023 0859   HCT 43.9 07/04/2023 0859   PLT 161 07/04/2023 0859   MCV 89.0 07/04/2023 0859   MCH 28.2 07/04/2023 0859   MCHC 31.7 07/04/2023 0859   RDW 17.6 (H) 07/04/2023 0859   LYMPHSABS 0.9 07/04/2023 0859   MONOABS 0.8 07/04/2023 0859   EOSABS 0.1 07/04/2023 0859   BASOSABS 0.0 07/04/2023 0859     CMP     Component Value Date/Time   NA 139 07/04/2023 0859   K 4.1 07/04/2023 0859   CL 106 07/04/2023 0859   CO2 32 07/04/2023 0859   GLUCOSE 88 07/04/2023 0859   BUN 10 07/04/2023 0859   CREATININE 0.69 07/04/2023 0859   CALCIUM 9.0 07/04/2023 0859   PROT 6.9 07/04/2023 0859   ALBUMIN 3.6 07/04/2023 0859   AST 26 07/04/2023 0859   ALT 18 07/04/2023 0859   ALKPHOS 148 (H) 07/04/2023 0859   BILITOT 0.4 07/04/2023 0859   GFRNONAA >60 07/04/2023 0859     ASSESSMENT & PLAN:Jorge [name] is a 72 y.o. male with    1. Rectal Adenocarcinoma, [phone] with lung metastasis, MMR normal  -Presented with rectal bleeding and frequent bowel movement.  -staging pelvic MRI 08/27/21 showed: staging at T4b; N2 likely associated with early extra mesorectal lymph node involvement; distance from tumor to internal anal sphincter is 1.2 cm; also with  extramural venous involvement. -baseline CEA 571.84 on 09/06/21. -he received concurrent chemoRT with Xeloda 11/1-11/21/22 under the care of Dr. Mitzi Hansen.  He responded clinically, no recurrent rectal bleeding, his pain has improved  -CEA has decreased on treatment -Lung biopsy 09/27/21 confirmed metastatic rectal cancer.  -S/p first-line CapeOx and bevacizumab 10/14/2021 - 01/25/2022  -Due to new liver lesions he switch to second line FOLFIRI and continued bevacizumab starting 02/21/2022 -Mr. Traub appears stable, tolerating FOLFIRI/Bev very well without significant side effects, only 1 episode of diarrhea after treatment which is somewhat diet related.  Side effects are adequately managed with supportive care at home.  He is able to recover and function well with very good performance status -No clinical evidence of disease progression.  Labs are adequate for treatment -Proceed with FOLFIRI/Bev at today as scheduled, same  dose -Follow-up and next cycle in 2 weeks -Restage in 4-6 weeks   2.  CIPN -Improved with gabapentin, Cymbalta, and oxycodone -managed by palliative care Lowella Bandy, NP -Stable on FOLFIRI      PLAN: -Labs reviewed -Proceed with FOLFIRI/Bev at today as scheduled, same dose, GCSF on day 3 -Follow-up with palliative care today during infusion -Follow-up and next cycle in 2 weeks -Restage in 4-6 weeks  Orders Placed This Encounter  Procedures   CT CHEST ABDOMEN PELVIS W CONTRAST    Standing Status:   Future    Standing Expiration Date:   07/03/2024    Order Specific Question:   If indicated for the ordered procedure, I authorize the administration of contrast media per Radiology protocol    Answer:   Yes    Order Specific Question:   Does the patient have a contrast media/X-ray dye allergy?    Answer:   No    Order Specific Question:   Preferred imaging location?    Answer:   [facility]    Order Specific Question:   If indicated for the ordered procedure, I  authorize the administration of oral contrast media per Radiology protocol    Answer:   Yes      All questions were answered. The patient knows to call the clinic with any problems, questions or concerns. No barriers to learning were detected.   Santiago Glad, NP-C 07/04/2023

## 2023-07-03 MED FILL — Dexamethasone Sodium Phosphate Inj 100 MG/10ML: INTRAMUSCULAR | Qty: 1 | Status: AC

## 2023-07-04 ENCOUNTER — Encounter: Payer: Self-pay | Admitting: Nurse Practitioner

## 2023-07-04 ENCOUNTER — Inpatient Hospital Stay: Payer: Medicare Other

## 2023-07-04 ENCOUNTER — Inpatient Hospital Stay (HOSPITAL_BASED_OUTPATIENT_CLINIC_OR_DEPARTMENT_OTHER): Payer: Medicare Other | Admitting: Nurse Practitioner

## 2023-07-04 DIAGNOSIS — C7800 Secondary malignant neoplasm of unspecified lung: Secondary | ICD-10-CM | POA: Diagnosis not present

## 2023-07-04 DIAGNOSIS — G893 Neoplasm related pain (acute) (chronic): Secondary | ICD-10-CM

## 2023-07-04 DIAGNOSIS — Z515 Encounter for palliative care: Secondary | ICD-10-CM

## 2023-07-04 DIAGNOSIS — C20 Malignant neoplasm of rectum: Secondary | ICD-10-CM | POA: Diagnosis not present

## 2023-07-04 DIAGNOSIS — Z5112 Encounter for antineoplastic immunotherapy: Secondary | ICD-10-CM | POA: Diagnosis not present

## 2023-07-04 DIAGNOSIS — Z95828 Presence of other vascular implants and grafts: Secondary | ICD-10-CM

## 2023-07-04 LAB — CBC WITH DIFFERENTIAL (CANCER CENTER ONLY)
Abs Immature Granulocytes: 0.03 10*3/uL (ref 0.00–0.07)
Basophils Absolute: 0 10*3/uL (ref 0.0–0.1)
Basophils Relative: 1 %
Eosinophils Absolute: 0.1 10*3/uL (ref 0.0–0.5)
Eosinophils Relative: 2 %
HCT: 43.9 % (ref 39.0–52.0)
Hemoglobin: 13.9 g/dL (ref 13.0–17.0)
Immature Granulocytes: 1 %
Lymphocytes Relative: 15 %
Lymphs Abs: 0.9 10*3/uL (ref 0.7–4.0)
MCH: 28.2 pg (ref 26.0–34.0)
MCHC: 31.7 g/dL (ref 30.0–36.0)
MCV: 89 fL (ref 80.0–100.0)
Monocytes Absolute: 0.8 10*3/uL (ref 0.1–1.0)
Monocytes Relative: 12 %
Neutro Abs: 4.2 10*3/uL (ref 1.7–7.7)
Neutrophils Relative %: 69 %
Platelet Count: 161 10*3/uL (ref 150–400)
RBC: 4.93 MIL/uL (ref 4.22–5.81)
RDW: 17.6 % — ABNORMAL HIGH (ref 11.5–15.5)
WBC Count: 6.1 10*3/uL (ref 4.0–10.5)
nRBC: 0 % (ref 0.0–0.2)

## 2023-07-04 LAB — CMP (CANCER CENTER ONLY)
ALT: 18 U/L (ref 0–44)
AST: 26 U/L (ref 15–41)
Albumin: 3.6 g/dL (ref 3.5–5.0)
Alkaline Phosphatase: 148 U/L — ABNORMAL HIGH (ref 38–126)
Anion gap: 1 — ABNORMAL LOW (ref 5–15)
BUN: 10 mg/dL (ref 8–23)
CO2: 32 mmol/L (ref 22–32)
Calcium: 9 mg/dL (ref 8.9–10.3)
Chloride: 106 mmol/L (ref 98–111)
Creatinine: 0.69 mg/dL (ref 0.61–1.24)
GFR, Estimated: >60 mL/min (ref >60)
Glucose, Bld: 88 mg/dL (ref 70–99)
Potassium: 4.1 mmol/L (ref 3.5–5.1)
Sodium: 139 mmol/L (ref 135–145)
Total Bilirubin: 0.4 mg/dL (ref 0.3–1.2)
Total Protein: 6.9 g/dL (ref 6.5–8.1)

## 2023-07-04 LAB — TOTAL PROTEIN, URINE DIPSTICK: Protein, ur: 30 mg/dL — AB

## 2023-07-04 MED ORDER — SODIUM CHLORIDE 0.9 % IV SOLN
5.0000 mg/kg | Freq: Once | INTRAVENOUS | Status: AC
  Administered 2023-07-04: 350 mg via INTRAVENOUS
  Filled 2023-07-04: qty 14

## 2023-07-04 MED ORDER — ATROPINE SULFATE 1 MG/ML IV SOLN
0.4000 mg | Freq: Once | INTRAVENOUS | Status: AC
  Administered 2023-07-04: 0.4 mg via INTRAVENOUS
  Filled 2023-07-04: qty 1

## 2023-07-04 MED ORDER — SODIUM CHLORIDE 0.9 % IV SOLN
10.0000 mg | Freq: Once | INTRAVENOUS | Status: AC
  Administered 2023-07-04: 10 mg via INTRAVENOUS
  Filled 2023-07-04: qty 10

## 2023-07-04 MED ORDER — OXYCODONE HCL 5 MG PO TABS: 5.0000 mg | ORAL_TABLET | Freq: Three times a day (TID) | ORAL | 0 refills | Status: DC | PRN

## 2023-07-04 MED ORDER — PALONOSETRON HCL INJECTION 0.25 MG/5ML
0.2500 mg | Freq: Once | INTRAVENOUS | Status: AC
  Administered 2023-07-04: 0.25 mg via INTRAVENOUS
  Filled 2023-07-04: qty 5

## 2023-07-04 MED ORDER — SODIUM CHLORIDE 0.9 % IV SOLN: Freq: Once | INTRAVENOUS | Status: AC

## 2023-07-04 MED ORDER — SODIUM CHLORIDE 0.9% FLUSH
10.0000 mL | Freq: Once | INTRAVENOUS | Status: AC
  Administered 2023-07-04: 10 mL

## 2023-07-04 MED ORDER — SODIUM CHLORIDE 0.9 % IV SOLN
400.0000 mg/m2 | Freq: Once | INTRAVENOUS | Status: AC
  Administered 2023-07-04: 740 mg via INTRAVENOUS
  Filled 2023-07-04: qty 25

## 2023-07-04 MED ORDER — SODIUM CHLORIDE 0.9 % IV SOLN
140.0000 mg/m2 | Freq: Once | INTRAVENOUS | Status: AC
  Administered 2023-07-04: 260 mg via INTRAVENOUS
  Filled 2023-07-04: qty 13

## 2023-07-04 MED ORDER — SODIUM CHLORIDE 0.9 % IV SOLN
2400.0000 mg/m2 | INTRAVENOUS | Status: DC
  Administered 2023-07-04: 4450 mg via INTRAVENOUS
  Filled 2023-07-04: qty 89

## 2023-07-04 NOTE — Progress Notes (Signed)
Palliative Medicine [facility]  Telephone:(336) [phone] Fax:(336) [phone]   Name: Jorge [name] Date: 07/04/2023 MRN: [id number]  DOB: Jul 04, 1951  Patient Care Team: Pcp, No as PCP - General Pickenpack-Cousar, Arty Baumgartner, NP as Nurse Practitioner (Nurse Practitioner) Malachy Mood, MD as Consulting Physician (Hematology)   INTERVAL HISTORY: Jorge [name] is a 72 y.o. 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  Palliative ask to see for symptom management and goals of care.   SOCIAL HISTORY:     reports that he has been smoking cigarettes. He has a 5 pack-year smoking history. He has never used smokeless tobacco. He reports that he does not currently use alcohol. He reports that he does not use drugs.  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  MEDICATIONS:  Current Outpatient Medications  Medication Sig Dispense Refill   amLODipine (NORVASC) 10 MG tablet Take 1 tablet (10 mg total) by mouth daily. 90 tablet 1   carbonyl iron (FEOSOL) 45 MG TABS tablet Take 45 mg by mouth daily.     diphenoxylate-atropine (LOMOTIL) 2.5-0.025 MG tablet Take 1-2 tablets by mouth 4 (four) times daily as needed for diarrhea or loose stools. 120 tablet 1   gabapentin (NEURONTIN) 100 MG capsule Take 2 capsules (200 mg total) by mouth 3 (three) times daily. 180 capsule 1   Multiple Vitamin (MULTIVITAMIN WITH MINERALS) TABS tablet Take 1 tablet by mouth daily.     ondansetron (ZOFRAN) 8 MG tablet Take 1 tablet (8 mg total) by mouth every 8 (eight) hours as needed for nausea or vomiting. 30 tablet 3   oxyCODONE (OXY IR/ROXICODONE) 5 MG immediate release tablet Take 1 tablet (5 mg total) by mouth every 8 (eight) hours as needed for  severe pain or breakthrough pain. 45 tablet 0   prochlorperazine (COMPAZINE) 10 MG tablet Take 1 tablet (10 mg total) by mouth every 6 (six) hours as needed (Nausea or vomiting). 30 tablet 1   No current facility-administered medications for this visit.    VITAL SIGNS: There were no vitals taken for this visit. There were no vitals filed for this visit.   Estimated body mass index is 19.02 kg/m as calculated from the following:   Height as of 06/19/23: 6' (1.829 m).   Weight as of 06/19/23: 140 lb 4 oz (63.6 kg).   PERFORMANCE STATUS (ECOG) : 1 - Symptomatic but completely ambulatory  Assessment NAD, ambulatory RRR Normal breathing pattern  AAO x3, mood appropriate    IMPRESSION:  I saw Jorge [name] during infusion. No acute distress. Continues to do well overall. Denies nausea, vomiting, or constipation. Occasional diarrhea which is controlled with lomotil. Is remaining active.   Neuropathic Pain  Jorge [name] reports his pain is well controlled on current regimen. Some days continue to be better than others. Tolerating gabapentin and oxycodone with noticeable improvement to his to neuropathic pain.    No changes to current regimen.   Will continue to closely monitor and support as needed.   PLAN:  Gabapentin 300mg  three times daily.   Oxy IR 5mg  every 8 hours as needed. Not requiring around the clock.  I will plan to see patient back in 3-4 weeks in collaboration with his oncology appointments.  Patient expressed understanding and was in agreement with this plan. He also understands that He can call the clinic at any time with any questions, concerns, or complaints.     Any controlled substances utilized were prescribed in the context of palliative care. PDMP has been reviewed.    Visit consisted of counseling and education dealing with the complex and emotionally intense issues of symptom management and palliative care in the setting of serious and potentially  life-threatening illness.Greater than 50%  of this time was spent counseling and coordinating care related to the above assessment and plan.  Willette Alma, AGPCNP-BC  Palliative Medicine Team/[facility]  *Please note that this is a verbal dictation therefore any spelling or grammatical errors are due to the "Dragon Medical One" system interpretation.

## 2023-07-04 NOTE — Patient Instructions (Signed)
 [facility]  Discharge Instructions: Thank you for choosing [facility] to provide your oncology and hematology care.   If you have a lab appointment with the Cancer Center, please go directly to the Cancer Center and check in at the registration area.   Wear comfortable clothing and clothing appropriate for easy access to any Portacath or PICC line.   We strive to give you quality time with your provider. You may need to reschedule your appointment if you arrive late (15 or more minutes).  Arriving late affects you and other patients whose appointments are after yours.  Also, if you miss three or more appointments without notifying the office, you may be dismissed from the clinic at the provider's discretion.      For prescription refill requests, have your pharmacy contact our office and allow 72 hours for refills to be completed.    Today you received the following chemotherapy and/or immunotherapy agents: Bevacizumab, Irinotecan, Leucovorin, and Fluorouracil.   To help prevent nausea and vomiting after your treatment, we encourage you to take your nausea medication as directed.  BELOW ARE SYMPTOMS THAT SHOULD BE REPORTED IMMEDIATELY: *FEVER GREATER THAN 100.4 F (38 C) OR HIGHER *CHILLS OR SWEATING *NAUSEA AND VOMITING THAT IS NOT CONTROLLED WITH YOUR NAUSEA MEDICATION *UNUSUAL SHORTNESS OF BREATH *UNUSUAL BRUISING OR BLEEDING *URINARY PROBLEMS (pain or burning when urinating, or frequent urination) *BOWEL PROBLEMS (unusual diarrhea, constipation, pain near the anus) TENDERNESS IN MOUTH AND THROAT WITH OR WITHOUT PRESENCE OF ULCERS (sore throat, sores in mouth, or a toothache) UNUSUAL RASH, SWELLING OR PAIN  UNUSUAL VAGINAL DISCHARGE OR ITCHING   Items with * indicate a potential emergency and should be followed up as soon as possible or go to the Emergency Department if any problems should occur.  Please show the CHEMOTHERAPY ALERT  CARD or IMMUNOTHERAPY ALERT CARD at check-in to the Emergency Department and triage nurse.  Should you have questions after your visit or need to cancel or reschedule your appointment, please contact [facility]  Dept: [phone]  and follow the prompts.  Office hours are 8:00 a.m. to 4:30 p.m. Monday - Friday. Please note that voicemails left after 4:00 p.m. may not be returned until the following business day.  We are closed weekends and major holidays. You have access to a nurse at all times for urgent questions. Please call the main number to the clinic Dept: [phone] and follow the prompts.   For any non-urgent questions, you may also contact your provider using MyChart. We now offer e-Visits for anyone 40 and older to request care online for non-urgent symptoms. For details visit [email or web address].   Also download the MyChart app! Go to the app store, search "MyChart", open the app, select [facility], and log in with your MyChart username and password. The chemotherapy medication bag should finish at 46 hours, 96 hours, or 7 days. For example, if your pump is scheduled for 46 hours and it was put on at 4:00 p.m., it should finish at 2:00 p.m. the day it is scheduled to come off regardless of your appointment time.     Estimated time to finish at: 12:30 PM   If the display on your pump reads "Low Volume" and it is beeping, take the batteries out of the pump and come to the cancer center for it to be taken off.   If the pump alarms go off prior to  the pump reading "Low Volume" then call [phone] and someone can assist you.  If the plunger comes out and the chemotherapy medication is leaking out, please use your home chemo spill kit to clean up the spill. Do NOT use paper towels or other household products.  If you have problems or questions regarding your pump, please call either [phone] (24 hours a day) or the cancer center  Monday-Friday 8:00 a.m.- 4:30 p.m. at the clinic number and we will assist you. If you are unable to get assistance, then go to the nearest Emergency Department and ask the staff to contact the IV team for assistance.

## 2023-07-04 NOTE — Progress Notes (Signed)
Ok to continue Bevacizumab at 350mg . Will continue to monitor weight/loss.  Anola Gurney Velma, [place], BCPS, BCOP 07/04/2023 11:34 AM

## 2023-07-05 ENCOUNTER — Other Ambulatory Visit: Payer: Medicare Other

## 2023-07-05 ENCOUNTER — Ambulatory Visit: Payer: Medicare Other

## 2023-07-05 ENCOUNTER — Ambulatory Visit: Payer: Medicare Other | Admitting: Hematology

## 2023-07-06 ENCOUNTER — Inpatient Hospital Stay: Payer: Medicare Other

## 2023-07-06 VITALS — BP 141/78 | HR 71 | Temp 98.2°F | Resp 16

## 2023-07-06 DIAGNOSIS — Z5112 Encounter for antineoplastic immunotherapy: Secondary | ICD-10-CM | POA: Diagnosis not present

## 2023-07-06 DIAGNOSIS — C20 Malignant neoplasm of rectum: Secondary | ICD-10-CM

## 2023-07-06 MED ORDER — SODIUM CHLORIDE 0.9% FLUSH
10.0000 mL | INTRAVENOUS | Status: DC | PRN
  Administered 2023-07-06: 10 mL

## 2023-07-06 MED ORDER — HEPARIN SOD (PORK) LOCK FLUSH 100 UNIT/ML IV SOLN
500.0000 [IU] | Freq: Once | INTRAVENOUS | Status: AC | PRN
  Administered 2023-07-06: 500 [IU]

## 2023-07-06 MED ORDER — PEGFILGRASTIM-JMDB 6 MG/0.6ML ~~LOC~~ SOSY
6.0000 mg | PREFILLED_SYRINGE | Freq: Once | SUBCUTANEOUS | Status: AC
  Administered 2023-07-06: 6 mg via SUBCUTANEOUS
  Filled 2023-07-06: qty 0.6

## 2023-07-12 ENCOUNTER — Ambulatory Visit: Payer: Medicare Other | Admitting: Hematology

## 2023-07-12 ENCOUNTER — Other Ambulatory Visit: Payer: Medicare Other

## 2023-07-12 ENCOUNTER — Ambulatory Visit: Payer: Medicare Other

## 2023-07-17 ENCOUNTER — Ambulatory Visit: Payer: Medicare Other | Admitting: Hematology

## 2023-07-17 ENCOUNTER — Other Ambulatory Visit: Payer: Medicare Other

## 2023-07-17 ENCOUNTER — Ambulatory Visit: Payer: Medicare Other

## 2023-07-18 MED FILL — Dexamethasone Sodium Phosphate Inj 100 MG/10ML: INTRAMUSCULAR | Qty: 1 | Status: AC

## 2023-07-19 ENCOUNTER — Other Ambulatory Visit: Payer: Self-pay | Admitting: Nurse Practitioner

## 2023-07-19 ENCOUNTER — Encounter: Payer: Self-pay | Admitting: Hematology

## 2023-07-19 ENCOUNTER — Inpatient Hospital Stay: Payer: Medicare Other

## 2023-07-19 ENCOUNTER — Inpatient Hospital Stay: Payer: Medicare Other | Attending: Physician Assistant

## 2023-07-19 ENCOUNTER — Inpatient Hospital Stay (HOSPITAL_BASED_OUTPATIENT_CLINIC_OR_DEPARTMENT_OTHER): Payer: Medicare Other | Admitting: Hematology

## 2023-07-19 VITALS — BP 137/89 | HR 75 | Temp 97.9°F | Resp 17 | Wt 141.3 lb

## 2023-07-19 DIAGNOSIS — Z95828 Presence of other vascular implants and grafts: Secondary | ICD-10-CM

## 2023-07-19 DIAGNOSIS — G620 Drug-induced polyneuropathy: Secondary | ICD-10-CM | POA: Diagnosis not present

## 2023-07-19 DIAGNOSIS — C20 Malignant neoplasm of rectum: Secondary | ICD-10-CM

## 2023-07-19 DIAGNOSIS — T451X5A Adverse effect of antineoplastic and immunosuppressive drugs, initial encounter: Secondary | ICD-10-CM | POA: Diagnosis not present

## 2023-07-19 DIAGNOSIS — C7801 Secondary malignant neoplasm of right lung: Secondary | ICD-10-CM | POA: Insufficient documentation

## 2023-07-19 DIAGNOSIS — Z5189 Encounter for other specified aftercare: Secondary | ICD-10-CM | POA: Diagnosis not present

## 2023-07-19 DIAGNOSIS — Z5112 Encounter for antineoplastic immunotherapy: Secondary | ICD-10-CM | POA: Diagnosis present

## 2023-07-19 DIAGNOSIS — C775 Secondary and unspecified malignant neoplasm of intrapelvic lymph nodes: Secondary | ICD-10-CM | POA: Insufficient documentation

## 2023-07-19 DIAGNOSIS — Z515 Encounter for palliative care: Secondary | ICD-10-CM

## 2023-07-19 DIAGNOSIS — C7800 Secondary malignant neoplasm of unspecified lung: Secondary | ICD-10-CM

## 2023-07-19 DIAGNOSIS — Z5111 Encounter for antineoplastic chemotherapy: Secondary | ICD-10-CM | POA: Diagnosis present

## 2023-07-19 DIAGNOSIS — G893 Neoplasm related pain (acute) (chronic): Secondary | ICD-10-CM

## 2023-07-19 LAB — CMP (CANCER CENTER ONLY)
ALT: 15 U/L (ref 0–44)
AST: 21 U/L (ref 15–41)
Albumin: 3.5 g/dL (ref 3.5–5.0)
Alkaline Phosphatase: 163 U/L — ABNORMAL HIGH (ref 38–126)
Anion gap: 3 — ABNORMAL LOW (ref 5–15)
BUN: 12 mg/dL (ref 8–23)
CO2: 32 mmol/L (ref 22–32)
Calcium: 8.9 mg/dL (ref 8.9–10.3)
Chloride: 104 mmol/L (ref 98–111)
Creatinine: 0.81 mg/dL (ref 0.61–1.24)
GFR, Estimated: >60 mL/min (ref >60)
Glucose, Bld: 122 mg/dL — ABNORMAL HIGH (ref 70–99)
Potassium: 3.8 mmol/L (ref 3.5–5.1)
Sodium: 139 mmol/L (ref 135–145)
Total Bilirubin: 0.4 mg/dL (ref 0.3–1.2)
Total Protein: 6.8 g/dL (ref 6.5–8.1)

## 2023-07-19 LAB — CBC WITH DIFFERENTIAL (CANCER CENTER ONLY)
Abs Immature Granulocytes: 0.03 10*3/uL (ref 0.00–0.07)
Basophils Absolute: 0.1 10*3/uL (ref 0.0–0.1)
Basophils Relative: 1 %
Eosinophils Absolute: 0.3 10*3/uL (ref 0.0–0.5)
Eosinophils Relative: 6 %
HCT: 43.9 % (ref 39.0–52.0)
Hemoglobin: 13.8 g/dL (ref 13.0–17.0)
Immature Granulocytes: 1 %
Lymphocytes Relative: 18 %
Lymphs Abs: 1 10*3/uL (ref 0.7–4.0)
MCH: 28.2 pg (ref 26.0–34.0)
MCHC: 31.4 g/dL (ref 30.0–36.0)
MCV: 89.8 fL (ref 80.0–100.0)
Monocytes Absolute: 0.7 10*3/uL (ref 0.1–1.0)
Monocytes Relative: 12 %
Neutro Abs: 3.4 10*3/uL (ref 1.7–7.7)
Neutrophils Relative %: 62 %
Platelet Count: 157 10*3/uL (ref 150–400)
RBC: 4.89 MIL/uL (ref 4.22–5.81)
RDW: 16.6 % — ABNORMAL HIGH (ref 11.5–15.5)
WBC Count: 5.4 10*3/uL (ref 4.0–10.5)
nRBC: 0 % (ref 0.0–0.2)

## 2023-07-19 LAB — TOTAL PROTEIN, URINE DIPSTICK: Protein, ur: 300 mg/dL — AB

## 2023-07-19 MED ORDER — SODIUM CHLORIDE 0.9 % IV SOLN
400.0000 mg/m2 | Freq: Once | INTRAVENOUS | Status: AC
  Administered 2023-07-19: 740 mg via INTRAVENOUS
  Filled 2023-07-19: qty 37

## 2023-07-19 MED ORDER — SODIUM CHLORIDE 0.9 % IV SOLN: Freq: Once | INTRAVENOUS | Status: AC

## 2023-07-19 MED ORDER — SODIUM CHLORIDE 0.9% FLUSH
10.0000 mL | Freq: Once | INTRAVENOUS | Status: AC
  Administered 2023-07-19: 10 mL

## 2023-07-19 MED ORDER — SODIUM CHLORIDE 0.9 % IV SOLN
140.0000 mg/m2 | Freq: Once | INTRAVENOUS | Status: AC
  Administered 2023-07-19: 260 mg via INTRAVENOUS
  Filled 2023-07-19: qty 13

## 2023-07-19 MED ORDER — SODIUM CHLORIDE 0.9% FLUSH: 10.0000 mL | INTRAVENOUS | Status: DC | PRN

## 2023-07-19 MED ORDER — AZITHROMYCIN 250 MG PO TABS: ORAL_TABLET | ORAL | 0 refills | Status: DC

## 2023-07-19 MED ORDER — PALONOSETRON HCL INJECTION 0.25 MG/5ML
0.2500 mg | Freq: Once | INTRAVENOUS | Status: AC
  Administered 2023-07-19: 0.25 mg via INTRAVENOUS
  Filled 2023-07-19: qty 5

## 2023-07-19 MED ORDER — OXYCODONE HCL 5 MG PO TABS
5.0000 mg | ORAL_TABLET | Freq: Three times a day (TID) | ORAL | 0 refills | Status: DC | PRN
Start: 2023-07-22 — End: 2023-08-02

## 2023-07-19 MED ORDER — SODIUM CHLORIDE 0.9 % IV SOLN
10.0000 mg | Freq: Once | INTRAVENOUS | Status: AC
  Administered 2023-07-19: 10 mg via INTRAVENOUS
  Filled 2023-07-19: qty 10

## 2023-07-19 MED ORDER — ATROPINE SULFATE 1 MG/ML IV SOLN
0.4000 mg | Freq: Once | INTRAVENOUS | Status: AC
  Administered 2023-07-19: 0.4 mg via INTRAVENOUS
  Filled 2023-07-19: qty 1

## 2023-07-19 MED ORDER — SODIUM CHLORIDE 0.9 % IV SOLN
2400.0000 mg/m2 | INTRAVENOUS | Status: DC
  Administered 2023-07-19: 4450 mg via INTRAVENOUS
  Filled 2023-07-19: qty 89

## 2023-07-19 NOTE — Patient Instructions (Signed)
[facility]  Discharge Instructions: Thank you for choosing [facility] to provide your oncology and hematology care.   If you have a lab appointment with the [facility], please go directly to the [facility] and check in at the registration area.   Wear comfortable clothing and clothing appropriate for easy access to any Portacath or PICC line.   We strive to give you quality time with your provider. You may need to reschedule your appointment if you arrive late (15 or more minutes).  Arriving late affects you and other patients whose appointments are after yours.  Also, if you miss three or more appointments without notifying the office, you may be dismissed from the clinic at the provider's discretion.      For prescription refill requests, have your pharmacy contact our office and allow 72 hours for refills to be completed.    Today you received the following chemotherapy and/or immunotherapy agents irinotecan, leucovorin, fluorourcil      To help prevent nausea and vomiting after your treatment, we encourage you to take your nausea medication as directed.  BELOW ARE SYMPTOMS THAT SHOULD BE REPORTED IMMEDIATELY: *FEVER GREATER THAN 100.4 F (38 C) OR HIGHER *CHILLS OR SWEATING *NAUSEA AND VOMITING THAT IS NOT CONTROLLED WITH YOUR NAUSEA MEDICATION *UNUSUAL SHORTNESS OF BREATH *UNUSUAL BRUISING OR BLEEDING *URINARY PROBLEMS (pain or burning when urinating, or frequent urination) *BOWEL PROBLEMS (unusual diarrhea, constipation, pain near the anus) TENDERNESS IN MOUTH AND THROAT WITH OR WITHOUT PRESENCE OF ULCERS (sore throat, sores in mouth, or a toothache) UNUSUAL RASH, SWELLING OR PAIN  UNUSUAL VAGINAL DISCHARGE OR ITCHING   Items with * indicate a potential emergency and should be followed up as soon as possible or go to the Emergency Department if any problems should occur.  Please show the CHEMOTHERAPY ALERT CARD or  IMMUNOTHERAPY ALERT CARD at check-in to the Emergency Department and triage nurse.  Should you have questions after your visit or need to cancel or reschedule your appointment, please contact [facility]  Dept: [phone]  and follow the prompts.  Office hours are 8:00 a.m. to 4:30 p.m. Monday - Friday. Please note that voicemails left after 4:00 p.m. may not be returned until the following business day.  We are closed weekends and major holidays. You have access to a nurse at all times for urgent questions. Please call the main number to the clinic Dept: [phone] and follow the prompts.   For any non-urgent questions, you may also contact your provider using MyChart. We now offer e-Visits for anyone 45 and older to request care online for non-urgent symptoms. For details visit [email or web address].   Also download the MyChart app! Go to the app store, search "MyChart", open the app, select [facility], and log in with your MyChart username and password.

## 2023-07-19 NOTE — Progress Notes (Signed)
Patient seen by Dr. America Brown are within treatment parameters.  Labs reviewed: Protein in Urine 300  Per physician team, patient is ready for treatment. Please note that modifications are being made to the treatment plan including will hold Beva due to elevated protein.

## 2023-07-19 NOTE — Assessment & Plan Note (Signed)
 [id number] with lung metastasis, MMR normal, KRAS(+) [id number]  -diagnosed 08/2021 by colonoscopy for rectal bleeding and frequent BM. Staging MRI showed early extra mesorectal lymph node involvement.  -baseline CEA 571.84 on 09/06/21.  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 Xeloda  dose reduced due to elevated liver enzymes.  -due to new liver lesions, we switched to second-line FOLFIRI, with continued beva, on 02/21/22. He tolerates well overall.  - restaging CT scan on April 22 and June 26 showed stable disease overall. -He is clinically doing well, diarrhea is well-controlled, no other significant side effect from treatment.   -Will continue current chemotherapy.  We previously discussed maintenance irinotecan  alone, since he is tolerating FOLFIRI well, will continue for now.

## 2023-07-19 NOTE — Assessment & Plan Note (Signed)
-

## 2023-07-19 NOTE — Progress Notes (Signed)
Jorge [name]   [phone] Fax:(336) [phone]   Clinic Follow up Note   Patient Care Team: Pcp, No as PCP - General Pickenpack-Cousar, Arty Baumgartner, NP as Nurse Practitioner (Nurse Practitioner) Malachy Mood, MD as Consulting Physician (Hematology)  Date of Service:  07/19/2023  CHIEF COMPLAINT: f/u of metastatic rectal cancer   CURRENT THERAPY:  -Second-line FOLFIRI, q2weeks, started 02/22/22 -Bevacizumab started 11/24/21  ASSESSMENT:  Jorge [name] is a 72 y.o. 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 Staging MRI showed early extra mesorectal lymph node involvement.  -baseline CEA 571.84 on 09/06/21.  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 Xeloda dose reduced due to elevated liver enzymes.  -due to new liver lesions, we switched to second-line FOLFIRI, with continued beva, on 02/21/22. He tolerates well overall.  - restaging CT scan on April 22 and June 26 showed stable disease overall. -He is clinically doing well, diarrhea is well-controlled, no other significant side effect from treatment.   -Will continue current chemotherapy.  We previously discussed maintenance irinotecan alone, since he is tolerating FOLFIRI well, will continue for now.   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 He is on gabapentin and cymbalta, taking 1oxycodone a day. He is followed by NP Jorge [name].     Sore throat and cough  -She has had of those symptoms for the past week, no fever or chills. -I called in azithromycin for him for 5 days  PLAN: -lab reviewed -protein 300 -CMP -pending - I will hold BEVA today due to elevated urine protein  -proceed with Folfiri today - I  will call in some anibiotic -lab/flush and treatment 9/26   SUMMARY OF ONCOLOGIC HISTORY: Oncology History Overview Note  Cancer Staging Rectal cancer [name]) Staging form: Colon and Rectum, AJCC 8th Edition - Clinical stage from 08/29/2021: [name], cM1 - Signed by Malachy Mood, MD on 08/29/2021    Rectal cancer (HCC)  07/23/2021 Imaging   CT AP  IMPRESSION: 1. Appearance of the rectum likely indicates a rectal mass suspicious for rectal carcinoma. Inflammatory process would be less likely. Direct visualization is suggested. 2. Cholelithiasis without evidence of acute cholecystitis. 3. Aortic atherosclerosis.   08/19/2021 Procedure   Colonoscopy, under Dr. Meridee Score  Impression: - Rectal tenderness, palpable rectal mass and hemorrhoids found on digital rectal exam. - Stool in the entire examined colon - lavaged with still inadequate clearance. - One 35 mm polyp at the hepatic flexure. Biopsied. Tattooed distal in case future endoscopic resection is considered - however, has larger issues with rectal mass currently as below. - Four, 5 to 11 mm polyps in the transverse colon and at the hepatic flexure, removed with a cold snare. Resected and retrieved. - Diverticulosis in the recto-sigmoid colon and in the sigmoid colon. - Rule out malignancy, partially obstructing tumor in the rectum and from 6 to 13 cm proximal to the anus. Biopsied.   08/19/2021 Pathology Results   Diagnosis 1. Hepatic Flexure Biopsy - TUBULAR ADENOMA WITHOUT HIGH-GRADE DYSPLASIA OR MALIGNANCY 2. Transverse Colon Biopsy, and hepatic flexure, polyps (4) - TUBULAR ADENOMA WITHOUT HIGH-GRADE DYSPLASIA OR MALIGNANCY - OTHER FRAGMENTS OF POLYPOID COLONIC MUCOSA WITH NO SPECIFIC HISTOPATHOLOGIC CHANGES - FOOD MATERIAL 3. Rectum, biopsy - ADENOCARCINOMA. SEE NOTE   08/22/2021  Initial Diagnosis   Rectal cancer (HCC)   08/26/2021 Imaging   IMPRESSION: 1. A 2.2 x 1.9 cm right middle lobe pulmonary nodule as well  as a total of four right upper lobe pulmonary nodule and masses measuring up to 3.6 cm. Findings concerning for metastatic primary lung cancer versus less likely metastases in a patient with rectal cancer. Additional imaging evaluation or consultation with Pulmonology or Thoracic Surgery recommended. 2. No gross hilar adenopathy, noting limited sensitivity for the detection of hilar adenopathy on this noncontrast study. 3. Cholelithiasis. 4.  Emphysema (ICD10-J43.9). 5. At least left anterior descending coronary artery calcifications.   08/27/2021 Imaging   IMPRESSION: Rectal adenocarcinoma T stage: T4 B   Rectal adenocarcinoma N stage: N2 disease likely associated with early extra mesorectal lymph node involvement.   Distance from tumor to the internal anal sphincter is 1.2 cm.   Also with extramural venous involvement as described.   08/29/2021 Cancer Staging   Staging form: Colon and Rectum, AJCC 8th Edition - Clinical stage from 08/29/2021: [name], cM1 - Signed by Malachy Mood, MD on 08/29/2021 Stage prefix: Initial diagnosis   01/23/2022 Imaging   CT CAP w contrast IMPRESSION: 1. Mild interval decrease in size of multiple right-sided pulmonary nodules. No new suspicious pulmonary nodule or mass. 2. Interval development of approximately 10 new ill-defined hypoattenuating liver lesions ranging in size from approximately 5-10 mm. Imaging features suspicious for metastatic disease. MRI abdomen with and without contrast may prove helpful to further evaluate. 3. Similar appearance of wall thickening in the rectum with perirectal edema. 4. Cholelithiasis. 5. Prostatomegaly. 6. Aortic Atherosclerosis (ICD10-I70.0).   11/21/2022 Imaging    IMPRESSION: 1. Stable exam. No new or progressive interval findings. 2. The primary rectosigmoid lesion described previously is not well seen on the current study. There is presacral and perirectal edema, likely treatment related. 3. No  substantial change in appearance of bilateral pulmonary metastases. 4. Stable tiny hypodensities in both hepatic lobes. These were previously characterized as metastatic disease. No new liver lesions on today's exam. 5. Cholelithiasis. 6. 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 He was last seen by NP Lacie on 07/04/2023. He presents to the clinic alone. Pt state that he felt like he got a cold, cause he got wet in the rain. Pt  denies having fever. Pt state that his neuropathy is about the same.    All other systems were reviewed with the patient and are negative.  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    SURGICAL HISTORY: Past Surgical History:  Procedure Laterality Date   BRONCHIAL BIOPSY  09/27/2021   Procedure: BRONCHIAL BIOPSIES;  Surgeon: Josephine Igo, DO;  Location: [facility];  Service: Pulmonary;;   BRONCHIAL BRUSHINGS  09/27/2021   Procedure: BRONCHIAL BRUSHINGS;  Surgeon: Josephine Igo, DO;  Location: [facility];  Service: Pulmonary;;   BRONCHIAL NEEDLE ASPIRATION BIOPSY  09/27/2021   Procedure: BRONCHIAL NEEDLE ASPIRATION BIOPSIES;  Surgeon: Josephine Igo, DO;  Location: [facility];  Service: Pulmonary;;   COLONOSCOPY     IR IMAGING GUIDED PORT INSERTION  10/24/2021   left breast  surgery Left 1968   VIDEO BRONCHOSCOPY WITH RADIAL ENDOBRONCHIAL ULTRASOUND  09/27/2021   Procedure: RADIAL ENDOBRONCHIAL ULTRASOUND;  Surgeon: Josephine Igo, DO;  Location: [facility];  Service: Pulmonary;;    I have reviewed the social history and family history with the patient and they are unchanged from previous note.  ALLERGIES:  has No Known Allergies.  MEDICATIONS:  Current Outpatient Medications  Medication Sig Dispense Refill   azithromycin (ZITHROMAX) 250 MG tablet Take 2 tabs for first day then 1 tab daily for 4 more days 6 each 0   amLODipine (NORVASC) 10 MG tablet Take 1 tablet (10 mg total) by mouth daily. 90 tablet 1   carbonyl iron (FEOSOL) 45 MG TABS tablet Take 45 mg by mouth daily.     diphenoxylate-atropine (LOMOTIL) 2.5-0.025 MG tablet Take 1-2 tablets by mouth 4 (four) times daily as needed for diarrhea or loose stools. 120 tablet 1   gabapentin (NEURONTIN) 100 MG capsule Take 2 capsules (200 mg total) by mouth 3 (three) times daily. 180 capsule 1   Multiple Vitamin (MULTIVITAMIN WITH MINERALS) TABS tablet Take 1 tablet by mouth daily.     ondansetron (ZOFRAN) 8 MG tablet Take 1 tablet (8 mg total) by mouth every 8 (eight) hours as needed for nausea or vomiting. 30 tablet 3   oxyCODONE (OXY IR/ROXICODONE) 5 MG immediate release tablet Take 1 tablet (5 mg total) by mouth every 8 (eight) hours as needed for severe pain or breakthrough pain. 45 tablet 0   prochlorperazine (COMPAZINE) 10 MG tablet Take 1 tablet (10 mg total) by mouth every 6 (six) hours as needed (Nausea or vomiting). 30 tablet 1   No current facility-administered medications for this visit.   Facility-Administered Medications Ordered in Other Visits  Medication Dose Route Frequency Provider Last Rate Last Admin   atropine injection 0.4 mg  0.4 mg Intravenous Once Malachy Mood, MD       fluorouracil (ADRUCIL) 4,450 mg in sodium chloride 0.9 % 61 mL chemo infusion  2,400 mg/m2 (Treatment Plan Recorded)  Intravenous 1 day or 1 dose Malachy Mood, MD       irinotecan (CAMPTOSAR) 260 mg in sodium chloride 0.9 % 500 mL chemo infusion  140 mg/m2 (Treatment Plan Recorded) Intravenous Once Malachy Mood, MD       leucovorin 740 mg in sodium chloride 0.9 % 250 mL infusion  400 mg/m2 (Treatment Plan Recorded) Intravenous Once Malachy Mood, MD       sodium chloride flush (NS) 0.9 % injection 10 mL  10 mL Intracatheter PRN Malachy Mood, MD        PHYSICAL EXAMINATION: ECOG PERFORMANCE STATUS: 1 - Symptomatic but completely ambulatory  Vitals:   07/19/23 0845  BP: 137/89  Pulse: 75  Resp: 17  Temp: 97.9 F (36.6 C)  SpO2: 97%   Wt Readings from Last 3 Encounters:  07/19/23 141 lb 4.8 oz (64.1 kg)  07/04/23 139 lb 3.2 oz (63.1 kg)  06/19/23 140 lb 4 oz (63.6 kg)     GENERAL:alert, no distress and comfortable SKIN: skin color normal, no rashes or significant lesions EYES: normal, Conjunctiva are pink and non-injected,  sclera clear  NEURO: alert & oriented x 3 with fluent speech LUNGS: (+) left lobe crackling to auscultation and percussion with normal breathing effort   LABORATORY DATA:  I have reviewed the data as listed    Latest Ref Rng & Units 07/19/2023    8:13 AM 07/04/2023    8:59 AM 06/19/2023   10:05 AM  CBC  WBC 4.0 - 10.5 K/uL 5.4  6.1  4.1   Hemoglobin 13.0 - 17.0 g/dL 81.1  91.4  78.2   Hematocrit 39.0 - 52.0 % 43.9  43.9  41.5   Platelets 150 - 400 K/uL 157  161  151         Latest Ref Rng & Units 07/19/2023    8:13 AM 07/04/2023    8:59 AM 06/19/2023   10:05 AM  CMP  Glucose 70 - 99 mg/dL 956  88  98   BUN 8 - 23 mg/dL 12  10  7    Creatinine 0.61 - 1.24 mg/dL 2.13  0.86  5.78   Sodium 135 - 145 mmol/L 139  139  138   Potassium 3.5 - 5.1 mmol/L 3.8  4.1  4.2   Chloride 98 - 111 mmol/L 104  106  106   CO2 22 - 32 mmol/L 32  32  30   Calcium 8.9 - 10.3 mg/dL 8.9  9.0  8.6   Total Protein 6.5 - 8.1 g/dL 6.8  6.9  6.8   Total Bilirubin 0.3 - 1.2 mg/dL 0.4  0.4  0.5   Alkaline  Phos 38 - 126 U/L 163  148  128   AST 15 - 41 U/L 21  26  35   ALT 0 - 44 U/L 15  18  31        RADIOGRAPHIC STUDIES: I have personally reviewed the radiological images as listed and agreed with the findings in the report. No results found.    Orders Placed This Encounter  Procedures   CBC with Differential (Cancer [name] Only)    Standing Status:   Future    Standing Expiration Date:   08/15/2024   CMP (Cancer [name] only)    Standing Status:   Future    Standing Expiration Date:   08/15/2024   Total Protein, Urine dipstick    Standing Status:   Future    Standing Expiration Date:   08/15/2024   CBC with Differential (Cancer [name] Only)    Standing Status:   Future    Standing Expiration Date:   08/29/2024   CMP (Cancer [name] only)    Standing Status:   Future    Standing Expiration Date:   08/29/2024   Total Protein, Urine dipstick    Standing Status:   Future    Standing Expiration Date:   08/29/2024   CBC with Differential (Cancer [name] Only)    Standing Status:   Future    Standing Expiration Date:   09/12/2024   CMP (Cancer [name] only)    Standing Status:   Future    Standing Expiration Date:   09/12/2024   Total Protein, Urine dipstick    Standing Status:   Future    Standing Expiration Date:   09/12/2024   All questions were answered. The patient knows to call the clinic with any problems, questions or concerns. No barriers to learning was detected. The total time spent in the appointment was 25 minutes.     Malachy Mood, MD 07/19/2023   I, Monica Martinez,  CMA, am acting as scribe for Malachy Mood, MD.   I have reviewed the above documentation for accuracy and completeness, and I agree with the above.

## 2023-07-21 ENCOUNTER — Inpatient Hospital Stay: Payer: Medicare Other

## 2023-07-21 VITALS — BP 154/82 | HR 76 | Temp 98.1°F

## 2023-07-21 DIAGNOSIS — Z5112 Encounter for antineoplastic immunotherapy: Secondary | ICD-10-CM | POA: Diagnosis not present

## 2023-07-21 DIAGNOSIS — C20 Malignant neoplasm of rectum: Secondary | ICD-10-CM

## 2023-07-21 MED ORDER — SODIUM CHLORIDE 0.9% FLUSH
10.0000 mL | INTRAVENOUS | Status: DC | PRN
  Administered 2023-07-21: 10 mL

## 2023-07-21 MED ORDER — PEGFILGRASTIM-JMDB 6 MG/0.6ML ~~LOC~~ SOSY
6.0000 mg | PREFILLED_SYRINGE | Freq: Once | SUBCUTANEOUS | Status: AC
  Administered 2023-07-21: 6 mg via SUBCUTANEOUS
  Filled 2023-07-21: qty 0.6

## 2023-07-21 MED ORDER — HEPARIN SOD (PORK) LOCK FLUSH 100 UNIT/ML IV SOLN
500.0000 [IU] | Freq: Once | INTRAVENOUS | Status: AC | PRN
  Administered 2023-07-21: 500 [IU]

## 2023-07-21 NOTE — Patient Instructions (Signed)

## 2023-07-27 ENCOUNTER — Ambulatory Visit (HOSPITAL_COMMUNITY)
Admission: RE | Admit: 2023-07-27 | Discharge: 2023-07-27 | Disposition: A | Discharge status: Home / Self Care | Payer: Medicare Other | Source: Ambulatory Visit | Attending: Nurse Practitioner

## 2023-07-27 ENCOUNTER — Encounter (HOSPITAL_COMMUNITY): Payer: Self-pay

## 2023-07-27 DIAGNOSIS — C7800 Secondary malignant neoplasm of unspecified lung: Secondary | ICD-10-CM | POA: Diagnosis present

## 2023-07-27 DIAGNOSIS — C20 Malignant neoplasm of rectum: Secondary | ICD-10-CM | POA: Insufficient documentation

## 2023-07-27 MED ORDER — IOHEXOL 300 MG/ML  SOLN
100.0000 mL | Freq: Once | INTRAMUSCULAR | Status: AC | PRN
  Administered 2023-07-27: 100 mL via INTRAVENOUS

## 2023-07-27 MED ORDER — HEPARIN SOD (PORK) LOCK FLUSH 100 UNIT/ML IV SOLN
INTRAVENOUS | Status: AC
  Filled 2023-07-27: qty 5

## 2023-07-27 MED ORDER — IOHEXOL 9 MG/ML PO SOLN
500.0000 mL | ORAL | Status: AC
  Administered 2023-07-27 (×2): 500 mL via ORAL

## 2023-07-27 MED ORDER — SODIUM CHLORIDE (PF) 0.9 % IJ SOLN
INTRAMUSCULAR | Status: AC
  Filled 2023-07-27: qty 50

## 2023-07-30 NOTE — Progress Notes (Unsigned)
Palliative Medicine [facility]  Telephone:(336) [phone] Fax:(336) [phone]   Name: Jorge [name] Date: 07/30/2023 MRN: [id number]  DOB: 01-Mar-1951  Patient Care Team: Pcp, No as PCP - General Pickenpack-Cousar, Arty Baumgartner, NP as Nurse Practitioner (Nurse Practitioner) Malachy Mood, MD as Consulting Physician (Hematology)   INTERVAL HISTORY: Jorge [name] is a 72 y.o. 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  Palliative ask to see for symptom management and goals of care.   SOCIAL HISTORY:     reports that he has been smoking cigarettes. He has a 5 pack-year smoking history. He has never used smokeless tobacco. He reports that he does not currently use alcohol. He reports that he does not use drugs.  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  MEDICATIONS:  Current Outpatient Medications  Medication Sig Dispense Refill   amLODipine (NORVASC) 10 MG tablet Take 1 tablet (10 mg total) by mouth daily. 90 tablet 1   azithromycin (ZITHROMAX) 250 MG tablet Take 2 tabs for first day then 1 tab daily for 4 more days 6 each 0   carbonyl iron (FEOSOL) 45 MG TABS tablet Take 45 mg by mouth daily.     diphenoxylate-atropine (LOMOTIL) 2.5-0.025 MG tablet Take 1-2 tablets by mouth 4 (four) times daily as needed for diarrhea or loose stools. 120 tablet 1   gabapentin (NEURONTIN) 100 MG capsule Take 2 capsules (200 mg total) by mouth 3 (three) times daily. 180 capsule 1   Multiple Vitamin (MULTIVITAMIN WITH MINERALS) TABS tablet Take 1 tablet by mouth daily.     ondansetron (ZOFRAN) 8 MG tablet Take 1 tablet (8 mg total) by mouth every 8 (eight) hours as needed for nausea or vomiting. 30 tablet 3   oxyCODONE  (OXY IR/ROXICODONE) 5 MG immediate release tablet Take 1 tablet (5 mg total) by mouth every 8 (eight) hours as needed for severe pain or breakthrough pain. 45 tablet 0   prochlorperazine (COMPAZINE) 10 MG tablet Take 1 tablet (10 mg total) by mouth every 6 (six) hours as needed (Nausea or vomiting). 30 tablet 1   No current facility-administered medications for this visit.    VITAL SIGNS: There were no vitals taken for this visit. There were no vitals filed for this visit.   Estimated body mass index is 19.16 kg/m as calculated from the following:   Height as of 06/19/23: 6' (1.829 m).   Weight as of 07/19/23: 141 lb 4.8 oz (64.1 kg).   PERFORMANCE STATUS (ECOG) : 1 - Symptomatic but completely ambulatory  Assessment NAD, ambulatory RRR Normal breathing pattern  AAO x3, mood appropriate    IMPRESSION: Jorge [name] presents to clinic for follow-up. No acute distress. He is feeling well overall. Denies nausea, vomiting, constipation. Appetite is good. Continues to remain as active as possible.   Neuropathic Pain  Jorge [name] reports his pain is well controlled on current regimen. Some days continue to be better than others. Tolerating gabapentin and oxycodone with noticeable improvement to his to neuropathic pain.    No changes to current regimen.   Will continue to closely monitor and support as needed.   PLAN:  Gabapentin 300mg  three times daily.   Oxy IR 5mg  every 8  hours as needed. Not requiring around the clock.  I will plan to see patient back in 3-4 weeks in collaboration with his oncology appointments.   Patient expressed understanding and was in agreement with this plan. He also understands that He can call the clinic at any time with any questions, concerns, or complaints.     Any controlled substances utilized were prescribed in the context of palliative care. PDMP has been reviewed.    Visit consisted of counseling and education dealing with the complex and  emotionally intense issues of symptom management and palliative care in the setting of serious and potentially life-threatening illness.Greater than 50%  of this time was spent counseling and coordinating care related to the above assessment and plan.  Willette Alma, AGPCNP-BC  Palliative Medicine Team/ Cancer Center  *Please note that this is a verbal dictation therefore any spelling or grammatical errors are due to the "[employer] One" system interpretation.

## 2023-08-01 MED FILL — Dexamethasone Sodium Phosphate Inj 100 MG/10ML: INTRAMUSCULAR | Qty: 1 | Status: AC

## 2023-08-01 NOTE — Assessment & Plan Note (Signed)
[id number] with lung metastasis, MMR normal, KRAS(+) [id number]  -diagnosed 08/2021 by colonoscopy for rectal bleeding and frequent BM. Staging MRI showed early extra mesorectal lymph node involvement.  -baseline CEA 571.84 on 09/06/21.  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 Xeloda dose reduced due to elevated liver enzymes.  -due to new liver lesions, we switched to second-line FOLFIRI, with continued beva, on 02/21/22. He tolerates well overall.  - restaging CT scan on April 22 and June 26 showed stable disease overall. -He is clinically doing well, diarrhea is well-controlled, no other significant side effect from treatment.   -Will continue current chemotherapy.  We previously discussed maintenance irinotecan alone, since he is tolerating FOLFIRI well, will continue for now.

## 2023-08-02 ENCOUNTER — Inpatient Hospital Stay: Payer: Medicare Other | Admitting: Hematology

## 2023-08-02 ENCOUNTER — Inpatient Hospital Stay: Payer: Medicare Other

## 2023-08-02 ENCOUNTER — Inpatient Hospital Stay (HOSPITAL_BASED_OUTPATIENT_CLINIC_OR_DEPARTMENT_OTHER): Payer: Medicare Other | Admitting: Nurse Practitioner

## 2023-08-02 ENCOUNTER — Encounter: Payer: Self-pay | Admitting: Nurse Practitioner

## 2023-08-02 VITALS — BP 144/72 | HR 65 | Temp 97.7°F | Resp 17 | Ht 72.0 in | Wt 144.8 lb

## 2023-08-02 DIAGNOSIS — C20 Malignant neoplasm of rectum: Secondary | ICD-10-CM

## 2023-08-02 DIAGNOSIS — C7800 Secondary malignant neoplasm of unspecified lung: Secondary | ICD-10-CM

## 2023-08-02 DIAGNOSIS — Z515 Encounter for palliative care: Secondary | ICD-10-CM

## 2023-08-02 DIAGNOSIS — Z5112 Encounter for antineoplastic immunotherapy: Secondary | ICD-10-CM | POA: Diagnosis not present

## 2023-08-02 DIAGNOSIS — M792 Neuralgia and neuritis, unspecified: Secondary | ICD-10-CM | POA: Diagnosis not present

## 2023-08-02 DIAGNOSIS — G893 Neoplasm related pain (acute) (chronic): Secondary | ICD-10-CM | POA: Diagnosis not present

## 2023-08-02 DIAGNOSIS — Z95828 Presence of other vascular implants and grafts: Secondary | ICD-10-CM

## 2023-08-02 LAB — CMP (CANCER CENTER ONLY)
ALT: 21 U/L (ref 0–44)
AST: 28 U/L (ref 15–41)
Albumin: 3.3 g/dL — ABNORMAL LOW (ref 3.5–5.0)
Alkaline Phosphatase: 216 U/L — ABNORMAL HIGH (ref 38–126)
Anion gap: 3 — ABNORMAL LOW (ref 5–15)
BUN: 13 mg/dL (ref 8–23)
CO2: 30 mmol/L (ref 22–32)
Calcium: 8.7 mg/dL — ABNORMAL LOW (ref 8.9–10.3)
Chloride: 106 mmol/L (ref 98–111)
Creatinine: 0.82 mg/dL (ref 0.61–1.24)
GFR, Estimated: >60 mL/min (ref >60)
Glucose, Bld: 98 mg/dL (ref 70–99)
Potassium: 4.2 mmol/L (ref 3.5–5.1)
Sodium: 139 mmol/L (ref 135–145)
Total Bilirubin: 0.3 mg/dL (ref 0.3–1.2)
Total Protein: 6.5 g/dL (ref 6.5–8.1)

## 2023-08-02 LAB — CBC WITH DIFFERENTIAL (CANCER CENTER ONLY)
Abs Immature Granulocytes: 0.04 10*3/uL (ref 0.00–0.07)
Basophils Absolute: 0 10*3/uL (ref 0.0–0.1)
Basophils Relative: 1 %
Eosinophils Absolute: 0.2 10*3/uL (ref 0.0–0.5)
Eosinophils Relative: 3 %
HCT: 40.5 % (ref 39.0–52.0)
Hemoglobin: 13 g/dL (ref 13.0–17.0)
Immature Granulocytes: 1 %
Lymphocytes Relative: 15 %
Lymphs Abs: 1 10*3/uL (ref 0.7–4.0)
MCH: 28.6 pg (ref 26.0–34.0)
MCHC: 32.1 g/dL (ref 30.0–36.0)
MCV: 89 fL (ref 80.0–100.0)
Monocytes Absolute: 0.7 10*3/uL (ref 0.1–1.0)
Monocytes Relative: 10 %
Neutro Abs: 5.1 10*3/uL (ref 1.7–7.7)
Neutrophils Relative %: 70 %
Platelet Count: 136 10*3/uL — ABNORMAL LOW (ref 150–400)
RBC: 4.55 MIL/uL (ref 4.22–5.81)
RDW: 16.9 % — ABNORMAL HIGH (ref 11.5–15.5)
WBC Count: 7.1 10*3/uL (ref 4.0–10.5)
nRBC: 0 % (ref 0.0–0.2)

## 2023-08-02 LAB — TOTAL PROTEIN, URINE DIPSTICK: Protein, ur: 30 mg/dL — AB

## 2023-08-02 MED ORDER — OXYCODONE HCL 5 MG PO TABS: 5.0000 mg | ORAL_TABLET | Freq: Three times a day (TID) | ORAL | 0 refills | Status: DC | PRN

## 2023-08-02 MED ORDER — PALONOSETRON HCL INJECTION 0.25 MG/5ML
0.2500 mg | Freq: Once | INTRAVENOUS | Status: AC
  Administered 2023-08-02: 0.25 mg via INTRAVENOUS
  Filled 2023-08-02: qty 5

## 2023-08-02 MED ORDER — SODIUM CHLORIDE 0.9% FLUSH
10.0000 mL | Freq: Once | INTRAVENOUS | Status: AC
  Administered 2023-08-02: 10 mL

## 2023-08-02 MED ORDER — SODIUM CHLORIDE 0.9 % IV SOLN
2400.0000 mg/m2 | INTRAVENOUS | Status: DC
  Administered 2023-08-02: 4450 mg via INTRAVENOUS
  Filled 2023-08-02: qty 89

## 2023-08-02 MED ORDER — SODIUM CHLORIDE 0.9 % IV SOLN
400.0000 mg/m2 | Freq: Once | INTRAVENOUS | Status: AC
  Administered 2023-08-02: 740 mg via INTRAVENOUS
  Filled 2023-08-02: qty 17.5

## 2023-08-02 MED ORDER — GABAPENTIN 100 MG PO CAPS: 200.0000 mg | ORAL_CAPSULE | Freq: Three times a day (TID) | ORAL | 1 refills | Status: DC

## 2023-08-02 MED ORDER — SODIUM CHLORIDE 0.9 % IV SOLN: Freq: Once | INTRAVENOUS | Status: AC

## 2023-08-02 MED ORDER — ATROPINE SULFATE 1 MG/ML IV SOLN
0.4000 mg | Freq: Once | INTRAVENOUS | Status: AC
  Administered 2023-08-02: 0.4 mg via INTRAVENOUS
  Filled 2023-08-02: qty 1

## 2023-08-02 MED ORDER — SODIUM CHLORIDE 0.9 % IV SOLN
10.0000 mg | Freq: Once | INTRAVENOUS | Status: AC
  Administered 2023-08-02: 10 mg via INTRAVENOUS
  Filled 2023-08-02: qty 10

## 2023-08-02 MED ORDER — AZITHROMYCIN 250 MG PO TABS: ORAL_TABLET | ORAL | 0 refills | Status: DC

## 2023-08-02 MED ORDER — SODIUM CHLORIDE 0.9 % IV SOLN
140.0000 mg/m2 | Freq: Once | INTRAVENOUS | Status: AC
  Administered 2023-08-02: 260 mg via INTRAVENOUS
  Filled 2023-08-02: qty 13

## 2023-08-02 MED ORDER — SODIUM CHLORIDE 0.9 % IV SOLN
5.0000 mg/kg | Freq: Once | INTRAVENOUS | Status: AC
  Administered 2023-08-02: 350 mg via INTRAVENOUS
  Filled 2023-08-02: qty 14

## 2023-08-02 NOTE — Progress Notes (Signed)
[facility]   Telephone:([phone] Fax:([phone]   Clinic Follow up Note   Patient Care Team: Pcp, No as PCP - General Pickenpack-Cousar, Arty Baumgartner, NP as Nurse Practitioner (Nurse Practitioner) Malachy Mood, MD as Consulting Physician (Hematology)  Date of Service:  08/02/2023  CHIEF COMPLAINT: f/u of metastatic rectal cancer  CURRENT THERAPY:  FOLFIRI and bevacizumab  Oncology History   Rectal cancer (HCC) [phone] with lung metastasis, MMR normal, KRAS(+) [id number]  -diagnosed 08/2021 by colonoscopy for rectal bleeding and frequent BM. Staging MRI showed early extra mesorectal lymph node involvement.  -baseline CEA 571.84 on 09/06/21.  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 Xeloda dose reduced due to elevated liver enzymes.  -due to new liver lesions, we switched to second-line FOLFIRI, with continued beva, on 02/21/22. He tolerates well overall.  - restaging CT scan on April 22 and June 26 showed stable disease overall. -He is clinically doing well, diarrhea is well-controlled, no other significant side effect from treatment.   -Will continue current chemotherapy.  We previously discussed maintenance irinotecan alone, since he is tolerating FOLFIRI well, will continue for now.   Assessment and Plan    Metastatic rectal cancer -I personally reviewed his restaging CT scan from August 04, 2023 stable disease on imaging to my eyes.  The formal radiology report is not returned yet. no new symptoms reported. Last treatment was well tolerated without diarrhea. Bevacizumab was held last time due to proteinuria. -Continue current chemotherapy regimen. -Check urine protein today and if improved, resume Bevacizumab.  Neuropathy Managed with Oxycodone 2 tablets daily and Gabapentin 2 tablets daily. Reports good control of  symptoms. -Continue current regimen.  Sore throat Improved with previous course of antibiotics but still hoarse. No cough or phlegm. -Prescribe another course of antibiotics.  Hypertension On Amlodipine. Plans to travel to [place]. -Continue Amlodipine.  Follow-up appointments Next chemotherapy on August 16, 2023, and follow-up appointment on October 10        SUMMARY OF ONCOLOGIC HISTORY: Oncology History Overview Note  Cancer Staging Rectal cancer [facility]) Staging form: Colon and Rectum, AJCC 8th Edition - Clinical stage from 08/29/2021: [name], cM1 - Signed by Malachy Mood, MD on 08/29/2021    Rectal cancer (HCC)  07/23/2021 Imaging   CT AP  IMPRESSION: 1. Appearance of the rectum likely indicates a rectal mass suspicious for rectal carcinoma. Inflammatory process would be less likely. Direct visualization is suggested. 2. Cholelithiasis without evidence of acute cholecystitis. 3. Aortic atherosclerosis.   08/19/2021 Procedure   Colonoscopy, under Dr. Meridee Score  Impression: - Rectal tenderness, palpable rectal mass and hemorrhoids found on digital rectal exam. - Stool in the entire examined colon - lavaged with still inadequate clearance. - One 35 mm polyp at the hepatic flexure. Biopsied. Tattooed distal in case future endoscopic resection is considered - however, has larger issues with rectal mass currently as below. - Four, 5 to 11 mm polyps in the transverse colon and at the hepatic flexure, removed with a cold snare. Resected and retrieved. - Diverticulosis in the recto-sigmoid colon and in the sigmoid colon. - Rule out malignancy, partially obstructing tumor in the rectum and from 6 to 13 cm proximal to the anus. Biopsied.   08/19/2021 Pathology Results   Diagnosis 1. Hepatic Flexure Biopsy - TUBULAR ADENOMA WITHOUT HIGH-GRADE DYSPLASIA OR MALIGNANCY 2. Transverse Colon Biopsy, and hepatic flexure, polyps (4) -  TUBULAR ADENOMA WITHOUT HIGH-GRADE DYSPLASIA OR  MALIGNANCY - OTHER FRAGMENTS OF POLYPOID COLONIC MUCOSA WITH NO SPECIFIC HISTOPATHOLOGIC CHANGES - FOOD MATERIAL 3. Rectum, biopsy - ADENOCARCINOMA. SEE NOTE   08/22/2021 Initial Diagnosis   Rectal cancer (HCC)   08/26/2021 Imaging   IMPRESSION: 1. A 2.2 x 1.9 cm right middle lobe pulmonary nodule as well as a total of four right upper lobe pulmonary nodule and masses measuring up to 3.6 cm. Findings concerning for metastatic primary lung cancer versus less likely metastases in a patient with rectal cancer. Additional imaging evaluation or consultation with Pulmonology or Thoracic Surgery recommended. 2. No gross hilar adenopathy, noting limited sensitivity for the detection of hilar adenopathy on this noncontrast study. 3. Cholelithiasis. 4.  Emphysema (ICD10-J43.9). 5. At least left anterior descending coronary artery calcifications.   08/27/2021 Imaging   IMPRESSION: Rectal adenocarcinoma T stage: T4 B   Rectal adenocarcinoma N stage: N2 disease likely associated with early extra mesorectal lymph node involvement.   Distance from tumor to the internal anal sphincter is 1.2 cm.   Also with extramural venous involvement as described.   08/29/2021 Cancer Staging   Staging form: Colon and Rectum, AJCC 8th Edition - Clinical stage from 08/29/2021: [name], cM1 - Signed by Malachy Mood, MD on 08/29/2021 Stage prefix: Initial diagnosis   01/23/2022 Imaging   CT CAP w contrast IMPRESSION: 1. Mild interval decrease in size of multiple right-sided pulmonary nodules. No new suspicious pulmonary nodule or mass. 2. Interval development of approximately 10 new ill-defined hypoattenuating liver lesions ranging in size from approximately 5-10 mm. Imaging features suspicious for metastatic disease. MRI abdomen with and without contrast may prove helpful to further evaluate. 3. Similar appearance of wall thickening in the rectum with perirectal edema. 4. Cholelithiasis. 5.  Prostatomegaly. 6. Aortic Atherosclerosis (ICD10-I70.0).   11/21/2022 Imaging    IMPRESSION: 1. Stable exam. No new or progressive interval findings. 2. The primary rectosigmoid lesion described previously is not well seen on the current study. There is presacral and perirectal edema, likely treatment related. 3. No substantial change in appearance of bilateral pulmonary metastases. 4. Stable tiny hypodensities in both hepatic lobes. These were previously characterized as metastatic disease. No new liver lesions on today's exam. 5. Cholelithiasis. 6. 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  History of Present Illness   The patient, a 72 year old with metastatic colon cancer, presents for a routine chemotherapy session. He reports no new symptoms since his last visit two weeks ago. He has been managing his neuropathy with oxycodone twice daily and gabapentin, which he reports are working well together. He also reports a recent course of antibiotics for a sore throat, which helped but did not fully resolve the issue. He requests a refill of the antibiotic. He has been experiencing hoarseness, but denies coughing up phlegm. He also reports a recent weight gain.  All other systems were reviewed with the patient and are negative.  MEDICAL HISTORY:  Past Medical History:   Diagnosis Date   Anemia    Rectal adenocarcinoma metastatic to intrapelvic lymph node (HCC) 08/19/2021    SURGICAL HISTORY: Past Surgical History:  Procedure Laterality Date   BRONCHIAL BIOPSY  09/27/2021   Procedure: BRONCHIAL BIOPSIES;  Surgeon: Josephine Igo, DO;  Location: [facility];  Service: Pulmonary;;   BRONCHIAL BRUSHINGS  09/27/2021   Procedure: BRONCHIAL BRUSHINGS;  Surgeon: Josephine Igo, DO;  Location: [facility];  Service: Pulmonary;;   BRONCHIAL NEEDLE ASPIRATION BIOPSY  09/27/2021   Procedure: BRONCHIAL NEEDLE ASPIRATION BIOPSIES;  Surgeon: Josephine Igo, DO;  Location: [facility];  Service: Pulmonary;;   COLONOSCOPY     IR IMAGING GUIDED PORT INSERTION  10/24/2021   left breast surgery Left 1968   VIDEO BRONCHOSCOPY WITH RADIAL ENDOBRONCHIAL ULTRASOUND  09/27/2021   Procedure: RADIAL ENDOBRONCHIAL ULTRASOUND;  Surgeon: Josephine Igo, DO;  Location: [facility];  Service: Pulmonary;;    I have reviewed the social history and family history with the patient and they are unchanged from previous note.  ALLERGIES:  has No Known Allergies.  MEDICATIONS:  Current Outpatient Medications  Medication Sig Dispense Refill   amLODipine (NORVASC) 10 MG tablet Take 1 tablet (10 mg total) by mouth daily. 90 tablet 1   azithromycin (ZITHROMAX) 250 MG tablet Take 2 tabs for first day then 1 tab daily for 4 more days 6 each 0   carbonyl iron (FEOSOL) 45 MG TABS tablet Take 45 mg by mouth daily.     diphenoxylate-atropine (LOMOTIL) 2.5-0.025 MG tablet Take 1-2 tablets by mouth 4 (four) times daily as needed for diarrhea or loose stools. 120 tablet 1   gabapentin (NEURONTIN) 100 MG capsule Take 2 capsules (200 mg total) by mouth 3 (three) times daily. 180 capsule 1   Multiple Vitamin (MULTIVITAMIN WITH MINERALS) TABS tablet Take 1 tablet by mouth daily.     ondansetron (ZOFRAN) 8 MG tablet Take 1 tablet (8 mg total) by mouth every 8 (eight) hours as needed for  nausea or vomiting. 30 tablet 3   oxyCODONE (OXY IR/ROXICODONE) 5 MG immediate release tablet Take 1 tablet (5 mg total) by mouth every 8 (eight) hours as needed for severe pain or breakthrough pain. 45 tablet 0   prochlorperazine (COMPAZINE) 10 MG tablet Take 1 tablet (10 mg total) by mouth every 6 (six) hours as needed (Nausea or vomiting). 30 tablet 1   No current facility-administered medications for this visit.    PHYSICAL EXAMINATION: ECOG PERFORMANCE STATUS: 1 - Symptomatic but completely ambulatory  Vitals:   08/02/23 0911 08/02/23 0912  BP: (!) 152/80 (!) 144/72  Pulse: 65   Resp: 17   Temp: 97.7 F (36.5 C)   SpO2: 96%    Wt Readings from Last 3 Encounters:  08/02/23 144 lb 12.8 oz (65.7 kg)  07/19/23 141 lb 4.8 oz (64.1 kg)  07/04/23 139 lb 3.2 oz (63.1 kg)     GENERAL:alert, no distress and comfortable SKIN: skin color, texture, turgor are normal, no rashes or significant lesions EYES: normal, Conjunctiva are pink and non-injected, sclera clear NECK: supple, thyroid normal size, non-tender, without nodularity LYMPH:  no palpable lymphadenopathy in the cervical, axillary  LUNGS: clear to auscultation and percussion with normal breathing effort HEART: regular rate & rhythm and no murmurs and no lower extremity edema ABDOMEN:abdomen soft, non-tender and normal bowel sounds Musculoskeletal:no cyanosis of digits and  no clubbing  NEURO: alert & oriented x 3 with fluent speech, no focal motor/sensory deficits         LABORATORY DATA:  I have reviewed the data as listed    Latest Ref Rng & Units 08/02/2023    8:50 AM 07/19/2023    8:13 AM 07/04/2023    8:59 AM  CBC  WBC 4.0 - 10.5 K/uL 7.1  5.4  6.1   Hemoglobin 13.0 - 17.0 g/dL 41.3  24.4  01.0   Hematocrit 39.0 - 52.0 % 40.5  43.9  43.9   Platelets 150 - 400 K/uL 136  157  161         Latest Ref Rng & Units 07/19/2023    8:13 AM 07/04/2023    8:59 AM 06/19/2023   10:05 AM  CMP  Glucose 70 - 99 mg/dL 272  88   98   BUN 8 - 23 mg/dL 12  10  7    Creatinine 0.61 - 1.24 mg/dL 5.36  6.44  0.34   Sodium 135 - 145 mmol/L 139  139  138   Potassium 3.5 - 5.1 mmol/L 3.8  4.1  4.2   Chloride 98 - 111 mmol/L 104  106  106   CO2 22 - 32 mmol/L 32  32  30   Calcium 8.9 - 10.3 mg/dL 8.9  9.0  8.6   Total Protein 6.5 - 8.1 g/dL 6.8  6.9  6.8   Total Bilirubin 0.3 - 1.2 mg/dL 0.4  0.4  0.5   Alkaline Phos 38 - 126 U/L 163  148  128   AST 15 - 41 U/L 21  26  35   ALT 0 - 44 U/L 15  18  31        RADIOGRAPHIC STUDIES: I have personally reviewed the radiological images as listed and agreed with the findings in the report. No results found.    Orders Placed This Encounter  Procedures   CBC with Differential (Cancer Center Only)    Standing Status:   Future    Standing Expiration Date:   09/26/2024   CMP (Cancer Center only)    Standing Status:   Future    Standing Expiration Date:   09/26/2024   Total Protein, Urine dipstick    Standing Status:   Future    Standing Expiration Date:   09/26/2024   All questions were answered. The patient knows to call the clinic with any problems, questions or concerns. No barriers to learning was detected. The total time spent in the appointment was 25 minutes.     Malachy Mood, MD 08/02/2023

## 2023-08-02 NOTE — Patient Instructions (Signed)
[facility]  Discharge Instructions: Thank you for choosing [facility] to provide your oncology and hematology care.   If you have a lab appointment with the Cancer Center, please go directly to the Cancer Center and check in at the registration area.   Wear comfortable clothing and clothing appropriate for easy access to any Portacath or PICC line.   We strive to give you quality time with your provider. You may need to reschedule your appointment if you arrive late (15 or more minutes).  Arriving late affects you and other patients whose appointments are after yours.  Also, if you miss three or more appointments without notifying the office, you may be dismissed from the clinic at the provider's discretion.      For prescription refill requests, have your pharmacy contact our office and allow 72 hours for refills to be completed.    Today you received the following chemotherapy and/or immunotherapy agents irinotecan, leucovorin, fluorourcil      To help prevent nausea and vomiting after your treatment, we encourage you to take your nausea medication as directed.  BELOW ARE SYMPTOMS THAT SHOULD BE REPORTED IMMEDIATELY: *FEVER GREATER THAN 100.4 F (38 C) OR HIGHER *CHILLS OR SWEATING *NAUSEA AND VOMITING THAT IS NOT CONTROLLED WITH YOUR NAUSEA MEDICATION *UNUSUAL SHORTNESS OF BREATH *UNUSUAL BRUISING OR BLEEDING *URINARY PROBLEMS (pain or burning when urinating, or frequent urination) *BOWEL PROBLEMS (unusual diarrhea, constipation, pain near the anus) TENDERNESS IN MOUTH AND THROAT WITH OR WITHOUT PRESENCE OF ULCERS (sore throat, sores in mouth, or a toothache) UNUSUAL RASH, SWELLING OR PAIN  UNUSUAL VAGINAL DISCHARGE OR ITCHING   Items with * indicate a potential emergency and should be followed up as soon as possible or go to the Emergency Department if any problems should occur.  Please show the CHEMOTHERAPY ALERT CARD or  IMMUNOTHERAPY ALERT CARD at check-in to the Emergency Department and triage nurse.  Should you have questions after your visit or need to cancel or reschedule your appointment, please contact [place]  Dept: [phone]  and follow the prompts.  Office hours are 8:00 a.m. to 4:30 p.m. Monday - Friday. Please note that voicemails left after 4:00 p.m. may not be returned until the following business day.  We are closed weekends and major holidays. You have access to a nurse at all times for urgent questions. Please call the main number to the clinic Dept: [phone] and follow the prompts.   For any non-urgent questions, you may also contact your provider using MyChart. We now offer e-Visits for anyone 21 and older to request care online for non-urgent symptoms. For details visit [email or web address].   Also download the MyChart app! Go to the app store, search "MyChart", open the app, select [facility], and log in with your MyChart username and password.

## 2023-08-04 ENCOUNTER — Inpatient Hospital Stay: Payer: Medicare Other

## 2023-08-04 VITALS — BP 165/46 | HR 78 | Temp 97.1°F | Resp 16

## 2023-08-04 DIAGNOSIS — Z5112 Encounter for antineoplastic immunotherapy: Secondary | ICD-10-CM | POA: Diagnosis not present

## 2023-08-04 DIAGNOSIS — C7800 Secondary malignant neoplasm of unspecified lung: Secondary | ICD-10-CM

## 2023-08-04 MED ORDER — SODIUM CHLORIDE 0.9% FLUSH
10.0000 mL | INTRAVENOUS | Status: DC | PRN
  Administered 2023-08-04: 10 mL

## 2023-08-04 MED ORDER — PEGFILGRASTIM-JMDB 6 MG/0.6ML ~~LOC~~ SOSY
6.0000 mg | PREFILLED_SYRINGE | Freq: Once | SUBCUTANEOUS | Status: AC
  Administered 2023-08-04: 6 mg via SUBCUTANEOUS

## 2023-08-04 MED ORDER — HEPARIN SOD (PORK) LOCK FLUSH 100 UNIT/ML IV SOLN
500.0000 [IU] | Freq: Once | INTRAVENOUS | Status: AC | PRN
  Administered 2023-08-04: 500 [IU]

## 2023-08-13 ENCOUNTER — Other Ambulatory Visit: Payer: Self-pay | Admitting: Hematology

## 2023-08-13 DIAGNOSIS — C20 Malignant neoplasm of rectum: Secondary | ICD-10-CM

## 2023-08-15 MED FILL — Dexamethasone Sodium Phosphate Inj 100 MG/10ML: INTRAMUSCULAR | Qty: 1 | Status: AC

## 2023-08-15 NOTE — Assessment & Plan Note (Addendum)
[id number] with lung metastasis, MMR normal, KRAS(+) [id number]  -diagnosed 08/2021 by colonoscopy for rectal bleeding and frequent BM. Staging MRI showed early extra mesorectal lymph node involvement.  -baseline CEA 571.84 on 09/06/21.  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 Xeloda dose reduced due to elevated liver enzymes.  -due to new liver lesions, we switched to second-line FOLFIRI, with continued beva, on 02/21/22. He tolerates well overall.  - restaging CT scan on February 26, 2023  and May 02, 2023 showed stable disease overall. -He is clinically doing well, diarrhea is well-controlled, no other significant side effect from treatment.   -Will continue current chemotherapy.  We previously discussed maintenance irinotecan alone, since he is tolerating FOLFIRI well, will continue for now.

## 2023-08-15 NOTE — Progress Notes (Unsigned)
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 Staging MRI showed early extra mesorectal lymph node involvement.  -baseline CEA 571.84 on 09/06/21.  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 Xeloda dose reduced due to elevated liver enzymes.  -due to new liver lesions, we switched to second-line FOLFIRI, with continued beva, on 02/21/22. He tolerates well overall.  - restaging CT scan on April 22 and June 26 showed stable disease overall. -He is clinically doing well, diarrhea is well-controlled, no other significant side effect from treatment.   -Will continue current chemotherapy.  We previously discussed maintenance irinotecan alone, since he is tolerating FOLFIRI well, will continue for now.   Plan -  Proceed with FOLFIRI cycle 27 day 1  The patient understands the plans discussed today and is in agreement with them.  He knows to contact our office if he develops concerns prior to his next appointment.  I provided *** minutes of face-to-face time during this encounter and > 50% was spent counseling as documented under my assessment and plan.    Carlean Jews, NP  [place] [facility] AT [facility] [address] [place] [address] Dept: [phone] Dept Fax: [phone]   No orders of the defined types were placed in this encounter.     CHIEF COMPLAINT:  CC: ***  Current Treatment:  ***  INTERVAL  HISTORY:  Jorge [name] is here today for repeat clinical assessment. He denies fevers or chills. He denies pain. His appetite is good. His weight {Weight change:10426}.  I have reviewed the past medical history, past surgical history, social history and family history with the patient and they are unchanged from previous note.  ALLERGIES:  has No Known Allergies.  MEDICATIONS:  Current Outpatient Medications  Medication Sig Dispense Refill   amLODipine (NORVASC) 10 MG tablet Take 1 tablet (10 mg total) by mouth daily. 90 tablet 1   azithromycin (ZITHROMAX) 250 MG tablet Take 2 tabs for first day then 1 tab daily for 4 more days 6 each 0   carbonyl iron (FEOSOL) 45 MG TABS tablet Take 45 mg by mouth daily.     diphenoxylate-atropine (LOMOTIL) 2.5-0.025 MG tablet Take 1-2 tablets by mouth 4 (four) times daily as needed for diarrhea or loose stools. 120 tablet 1   gabapentin (NEURONTIN) 100 MG capsule Take 2 capsules (200 mg total) by mouth 3 (three) times daily. 180 capsule 1   Multiple Vitamin (MULTIVITAMIN WITH MINERALS) TABS tablet Take 1 tablet by mouth daily.     ondansetron (ZOFRAN) 8 MG tablet Take 1 tablet (8 mg total) by mouth every 8 (eight) hours as needed for nausea or vomiting. 30 tablet 3   oxyCODONE (OXY IR/ROXICODONE) 5 MG immediate release tablet Take 1 tablet (5 mg total) by mouth every 8 (eight) hours as needed for severe pain or breakthrough pain. 45 tablet 0   prochlorperazine (COMPAZINE) 10 MG tablet Take 1 tablet (10 mg total) by mouth every 6 (six) hours as needed (Nausea or vomiting). 30 tablet  1   No current facility-administered medications for this visit.    HISTORY OF PRESENT ILLNESS:   Oncology History Overview Note  Cancer Staging Rectal cancer [facility]) Staging form: Colon and Rectum, AJCC 8th Edition - Clinical stage from 08/29/2021: [name], cM1 - Signed by Malachy Mood, MD on 08/29/2021    Rectal cancer (HCC)  07/23/2021 Imaging   CT AP  IMPRESSION: 1.  Appearance of the rectum likely indicates a rectal mass suspicious for rectal carcinoma. Inflammatory process would be less likely. Direct visualization is suggested. 2. Cholelithiasis without evidence of acute cholecystitis. 3. Aortic atherosclerosis.   08/19/2021 Procedure   Colonoscopy, under Dr. Meridee Score  Impression: - Rectal tenderness, palpable rectal mass and hemorrhoids found on digital rectal exam. - Stool in the entire examined colon - lavaged with still inadequate clearance. - One 35 mm polyp at the hepatic flexure. Biopsied. Tattooed distal in case future endoscopic resection is considered - however, has larger issues with rectal mass currently as below. - Four, 5 to 11 mm polyps in the transverse colon and at the hepatic flexure, removed with a cold snare. Resected and retrieved. - Diverticulosis in the recto-sigmoid colon and in the sigmoid colon. - Rule out malignancy, partially obstructing tumor in the rectum and from 6 to 13 cm proximal to the anus. Biopsied.   08/19/2021 Pathology Results   Diagnosis 1. Hepatic Flexure Biopsy - TUBULAR ADENOMA WITHOUT HIGH-GRADE DYSPLASIA OR MALIGNANCY 2. Transverse Colon Biopsy, and hepatic flexure, polyps (4) - TUBULAR ADENOMA WITHOUT HIGH-GRADE DYSPLASIA OR MALIGNANCY - OTHER FRAGMENTS OF POLYPOID COLONIC MUCOSA WITH NO SPECIFIC HISTOPATHOLOGIC CHANGES - FOOD MATERIAL 3. Rectum, biopsy - ADENOCARCINOMA. SEE NOTE   08/22/2021 Initial Diagnosis   Rectal cancer (HCC)   08/26/2021 Imaging   IMPRESSION: 1. A 2.2 x 1.9 cm right middle lobe pulmonary nodule as well as a total of four right upper lobe pulmonary nodule and masses measuring up to 3.6 cm. Findings concerning for metastatic primary lung cancer versus less likely metastases in a patient with rectal cancer. Additional imaging evaluation or consultation with Pulmonology or Thoracic Surgery recommended. 2. No gross hilar adenopathy, noting limited sensitivity for  the detection of hilar adenopathy on this noncontrast study. 3. Cholelithiasis. 4.  Emphysema (ICD10-J43.9). 5. At least left anterior descending coronary artery calcifications.   08/27/2021 Imaging   IMPRESSION: Rectal adenocarcinoma T stage: T4 B   Rectal adenocarcinoma N stage: N2 disease likely associated with early extra mesorectal lymph node involvement.   Distance from tumor to the internal anal sphincter is 1.2 cm.   Also with extramural venous involvement as described.   08/29/2021 Cancer Staging   Staging form: Colon and Rectum, AJCC 8th Edition - Clinical stage from 08/29/2021: [name], cM1 - Signed by Malachy Mood, MD on 08/29/2021 Stage prefix: Initial diagnosis   01/23/2022 Imaging   CT CAP w contrast IMPRESSION: 1. Mild interval decrease in size of multiple right-sided pulmonary nodules. No new suspicious pulmonary nodule or mass. 2. Interval development of approximately 10 new ill-defined hypoattenuating liver lesions ranging in size from approximately 5-10 mm. Imaging features suspicious for metastatic disease. MRI abdomen with and without contrast may prove helpful to further evaluate. 3. Similar appearance of wall thickening in the rectum with perirectal edema. 4. Cholelithiasis. 5. Prostatomegaly. 6. Aortic Atherosclerosis (ICD10-I70.0).   11/21/2022 Imaging    IMPRESSION: 1. Stable exam. No new or progressive interval findings. 2. The primary rectosigmoid lesion described previously is not well seen on the current study. There  is presacral and perirectal edema, likely treatment related. 3. No substantial change in appearance of bilateral pulmonary metastases. 4. Stable tiny hypodensities in both hepatic lobes. These were previously characterized as metastatic disease. No new liver lesions on today's exam. 5. Cholelithiasis. 6. 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)   10/14/2021 - 01/25/2022 Chemotherapy   Patient is on Treatment Plan : COLORECTAL CapeOx + Bevacizumab q21d     02/21/2022 - 06/29/2022 Chemotherapy   Patient is on Treatment Plan : COLORECTAL FOLFIRI / BEVACIZUMAB Q14D     02/21/2022 -  Chemotherapy   Patient is on Treatment Plan : COLORECTAL FOLFIRI + Bevacizumab q14d         REVIEW OF SYSTEMS:   Constitutional: Denies fevers, chills or abnormal weight loss Eyes: Denies blurriness of vision Ears, nose, mouth, throat, and face: Denies mucositis or sore throat Respiratory: Denies cough, dyspnea or wheezes Cardiovascular: Denies palpitation, chest discomfort or lower extremity swelling Gastrointestinal:  Denies nausea, heartburn or change in bowel habits Skin: Denies abnormal skin rashes Lymphatics: Denies new lymphadenopathy or easy bruising Neurological:Denies numbness, tingling or new weaknesses Behavioral/Psych: Mood is stable, no new changes  All other systems were reviewed with the patient and are negative.   VITALS:  There were no vitals taken for this visit.  Wt Readings from Last 3 Encounters:  08/02/23 144 lb 12.8 oz (65.7 kg)  07/19/23 141 lb 4.8 oz (64.1 kg)  07/04/23 139 lb 3.2 oz (63.1 kg)    There is no height or weight on file to calculate BMI.  Performance status (ECOG): {CHL ONC [id number]  PHYSICAL EXAM:   GENERAL:alert, no distress and comfortable SKIN: skin color, texture, turgor are normal, no rashes or significant lesions EYES: normal, Conjunctiva are pink and non-injected, sclera clear OROPHARYNX:no exudate, no erythema and lips, buccal mucosa, and tongue normal  NECK: supple, thyroid normal size, non-tender, without nodularity LYMPH:  no palpable lymphadenopathy in the cervical, axillary or inguinal LUNGS: clear to auscultation and percussion with normal breathing effort HEART: regular rate & rhythm and no murmurs  and no lower extremity edema ABDOMEN:abdomen soft, non-tender and normal bowel sounds Musculoskeletal:no cyanosis of digits and no clubbing  NEURO: alert & oriented x 3 with fluent speech, no focal motor/sensory deficits  LABORATORY DATA:  I have reviewed the data as listed    Component Value Date/Time   NA 139 08/02/2023 0850   K 4.2 08/02/2023 0850   CL 106 08/02/2023 0850   CO2 30 08/02/2023 0850   GLUCOSE 98 08/02/2023 0850   BUN 13 08/02/2023 0850   CREATININE 0.82 08/02/2023 0850   CALCIUM 8.7 (L) 08/02/2023 0850   PROT 6.5 08/02/2023 0850   ALBUMIN 3.3 (L) 08/02/2023 0850   AST 28 08/02/2023 0850   ALT 21 08/02/2023 0850   ALKPHOS 216 (H) 08/02/2023 0850   BILITOT 0.3 08/02/2023 0850   GFRNONAA >60 08/02/2023 0850    No results found for: "SPEP", "UPEP"  Lab Results  Component Value Date   WBC 7.1 08/02/2023   NEUTROABS 5.1 08/02/2023   HGB 13.0 08/02/2023   HCT 40.5 08/02/2023   MCV 89.0 08/02/2023   PLT 136 (L) 08/02/2023      Chemistry      Component Value Date/Time   NA 139 08/02/2023 0850   K 4.2 08/02/2023 0850   CL 106 08/02/2023 0850  CO2 30 08/02/2023 0850   BUN 13 08/02/2023 0850   CREATININE 0.82 08/02/2023 0850      Component Value Date/Time   CALCIUM 8.7 (L) 08/02/2023 0850   ALKPHOS 216 (H) 08/02/2023 0850   AST 28 08/02/2023 0850   ALT 21 08/02/2023 0850   BILITOT 0.3 08/02/2023 0850       RADIOGRAPHIC STUDIES: I have personally reviewed the radiological images as listed and agreed with the findings in the report. CT CHEST ABDOMEN PELVIS W CONTRAST  Result Date: 08/02/2023 CLINICAL DATA:  Metastatic rectal cancer restaging * Tracking Code: BO * EXAM: CT CHEST, ABDOMEN, AND PELVIS WITH CONTRAST TECHNIQUE: Multidetector CT imaging of the chest, abdomen and pelvis was performed following the standard protocol during bolus administration of intravenous contrast. RADIATION DOSE REDUCTION: This exam was performed according to the  departmental dose-optimization program which includes automated exposure control, adjustment of the mA and/or kV according to patient size and/or use of iterative reconstruction technique. CONTRAST:  OMNIPAQUE IOHEXOL 300 MG/ML SOLN additional oral enteric contrast COMPARISON:  CT abdomen pelvis, 05/02/2023 CT chest abdomen pelvis, 02/27/2023 FINDINGS: CT CHEST FINDINGS Cardiovascular: Right chest port catheter. Normal heart size. Left coronary artery calcifications. No pericardial effusion. Mediastinum/Nodes: No enlarged mediastinal, hilar, or axillary lymph nodes. Thyroid gland, trachea, and esophagus demonstrate no significant findings. Lungs/Pleura: Interval enlargement of a lobulated nodule of the anterior right middle lobe measuring 2.2 x 1.6 cm, previously 1.7 x 1.3 cm (series 4, image 114). Interval enlargement of a lobulated nodule of the inferior medial right upper lobe measuring 2.7 x 1.6 cm, previously 2.4 x 1.3 cm (series 4, image 97). Unchanged, or minimally diminished size of a nodule of the anterior right upper lobe measuring 2.5 x 1.8 cm, previously 2.7 x 1.9 cm (series 4, image 70). Unchanged nodule of the anterior right apex measuring 1.6 x 1.3 cm (series 4, image 37). Background of moderate centrilobular and paraseptal emphysema. Diffuse bilateral bronchial wall thickening. No pleural effusion or pneumothorax. Musculoskeletal: No chest wall abnormality. No acute osseous findings. CT ABDOMEN PELVIS FINDINGS Hepatobiliary: Unchanged tiny subcentimeter low-attenuation liver lesions, for example in the central liver dome measuring 0.5 cm (series 2, image 64), and in the anterior left lobe of the liver measuring 0.5 cm (series 2, image 72). Mildly distended gallbladder containing numerous tiny gallstones. No gallbladder wall thickening, or biliary dilatation. Pancreas: Unremarkable. No pancreatic ductal dilatation or surrounding inflammatory changes. Spleen: Normal in size without significant  abnormality. Adrenals/Urinary Tract: Adrenal glands are unremarkable. Kidneys are normal, without renal calculi, solid lesion, or hydronephrosis. Bladder is unremarkable. Stomach/Bowel: Stomach is within normal limits. Descending duodenal diverticulum. Appendix appears normal. Unchanged right eccentric wall thickening of the low rectum (series 2, image 120). Vascular/Lymphatic: Aortic atherosclerosis. No enlarged abdominal or pelvic lymph nodes. Reproductive: Mild prostatomegaly. Other: No abdominal wall hernia or abnormality. No ascites. Musculoskeletal: No acute osseous findings. IMPRESSION: 1. Interval enlargement of lobulated nodules of the anterior right middle lobe and inferior medial right upper lobe, consistent with progression of pulmonary metastatic disease. 2. Unchanged, or minimally diminished size of a nodule of the anterior right upper lobe and unchanged nodule of the anterior right apex. 3. Unchanged tiny subcentimeter low-attenuation liver lesions. Attention on follow-up. 4. Unchanged right eccentric wall thickening of the low rectum, consistent with known primary rectal neoplasm. 5. No evidence of lymphadenopathy or new metastatic disease in the chest abdomen or pelvis. 6. Cholelithiasis. 7. Coronary artery disease. Aortic Atherosclerosis (ICD10-I70.0) and Emphysema (ICD10-J43.9). Electronically Signed  By: Jearld Lesch M.D.   On: 08/02/2023 10:28

## 2023-08-16 ENCOUNTER — Inpatient Hospital Stay: Payer: Medicare Other | Attending: Physician Assistant

## 2023-08-16 ENCOUNTER — Inpatient Hospital Stay (HOSPITAL_BASED_OUTPATIENT_CLINIC_OR_DEPARTMENT_OTHER): Payer: Medicare Other | Admitting: Nurse Practitioner

## 2023-08-16 ENCOUNTER — Other Ambulatory Visit: Payer: Self-pay | Admitting: Nurse Practitioner

## 2023-08-16 ENCOUNTER — Inpatient Hospital Stay: Payer: Medicare Other

## 2023-08-16 VITALS — BP 151/74 | HR 71 | Temp 97.5°F | Resp 18 | Wt 142.1 lb

## 2023-08-16 DIAGNOSIS — Z515 Encounter for palliative care: Secondary | ICD-10-CM

## 2023-08-16 DIAGNOSIS — C7801 Secondary malignant neoplasm of right lung: Secondary | ICD-10-CM | POA: Insufficient documentation

## 2023-08-16 DIAGNOSIS — G893 Neoplasm related pain (acute) (chronic): Secondary | ICD-10-CM

## 2023-08-16 DIAGNOSIS — C20 Malignant neoplasm of rectum: Secondary | ICD-10-CM | POA: Diagnosis not present

## 2023-08-16 DIAGNOSIS — Z5111 Encounter for antineoplastic chemotherapy: Secondary | ICD-10-CM | POA: Diagnosis present

## 2023-08-16 DIAGNOSIS — Z5189 Encounter for other specified aftercare: Secondary | ICD-10-CM | POA: Diagnosis not present

## 2023-08-16 DIAGNOSIS — C7800 Secondary malignant neoplasm of unspecified lung: Secondary | ICD-10-CM

## 2023-08-16 DIAGNOSIS — Z95828 Presence of other vascular implants and grafts: Secondary | ICD-10-CM

## 2023-08-16 DIAGNOSIS — Z5112 Encounter for antineoplastic immunotherapy: Secondary | ICD-10-CM | POA: Insufficient documentation

## 2023-08-16 LAB — CBC WITH DIFFERENTIAL (CANCER CENTER ONLY)
Abs Immature Granulocytes: 0.03 10*3/uL (ref 0.00–0.07)
Basophils Absolute: 0 10*3/uL (ref 0.0–0.1)
Basophils Relative: 1 %
Eosinophils Absolute: 0.2 10*3/uL (ref 0.0–0.5)
Eosinophils Relative: 3 %
HCT: 41.7 % (ref 39.0–52.0)
Hemoglobin: 13.4 g/dL (ref 13.0–17.0)
Immature Granulocytes: 1 %
Lymphocytes Relative: 15 %
Lymphs Abs: 0.9 10*3/uL (ref 0.7–4.0)
MCH: 28.4 pg (ref 26.0–34.0)
MCHC: 32.1 g/dL (ref 30.0–36.0)
MCV: 88.3 fL (ref 80.0–100.0)
Monocytes Absolute: 0.7 10*3/uL (ref 0.1–1.0)
Monocytes Relative: 12 %
Neutro Abs: 4.1 10*3/uL (ref 1.7–7.7)
Neutrophils Relative %: 68 %
Platelet Count: 153 10*3/uL (ref 150–400)
RBC: 4.72 MIL/uL (ref 4.22–5.81)
RDW: 16.8 % — ABNORMAL HIGH (ref 11.5–15.5)
WBC Count: 6 10*3/uL (ref 4.0–10.5)
nRBC: 0 % (ref 0.0–0.2)

## 2023-08-16 LAB — CMP (CANCER CENTER ONLY)
ALT: 20 U/L (ref 0–44)
AST: 28 U/L (ref 15–41)
Albumin: 3.5 g/dL (ref 3.5–5.0)
Alkaline Phosphatase: 187 U/L — ABNORMAL HIGH (ref 38–126)
Anion gap: 3 — ABNORMAL LOW (ref 5–15)
BUN: 12 mg/dL (ref 8–23)
CO2: 29 mmol/L (ref 22–32)
Calcium: 9 mg/dL (ref 8.9–10.3)
Chloride: 106 mmol/L (ref 98–111)
Creatinine: 0.84 mg/dL (ref 0.61–1.24)
GFR, Estimated: >60 mL/min (ref >60)
Glucose, Bld: 95 mg/dL (ref 70–99)
Potassium: 4.2 mmol/L (ref 3.5–5.1)
Sodium: 138 mmol/L (ref 135–145)
Total Bilirubin: 0.3 mg/dL (ref 0.3–1.2)
Total Protein: 6.8 g/dL (ref 6.5–8.1)

## 2023-08-16 LAB — TOTAL PROTEIN, URINE DIPSTICK: Protein, ur: NEGATIVE mg/dL

## 2023-08-16 MED ORDER — OXYCODONE HCL 5 MG PO TABS: 5.0000 mg | ORAL_TABLET | Freq: Three times a day (TID) | ORAL | 0 refills | Status: DC | PRN

## 2023-08-16 MED ORDER — ATROPINE SULFATE 1 MG/ML IV SOLN
0.4000 mg | Freq: Once | INTRAVENOUS | Status: AC
  Administered 2023-08-16: 0.4 mg via INTRAVENOUS
  Filled 2023-08-16: qty 1

## 2023-08-16 MED ORDER — SODIUM CHLORIDE 0.9 % IV SOLN
140.0000 mg/m2 | Freq: Once | INTRAVENOUS | Status: AC
  Administered 2023-08-16: 260 mg via INTRAVENOUS
  Filled 2023-08-16: qty 13

## 2023-08-16 MED ORDER — SODIUM CHLORIDE 0.9 % IV SOLN
5.0000 mg/kg | Freq: Once | INTRAVENOUS | Status: AC
  Administered 2023-08-16: 350 mg via INTRAVENOUS
  Filled 2023-08-16: qty 14

## 2023-08-16 MED ORDER — PALONOSETRON HCL INJECTION 0.25 MG/5ML
0.2500 mg | Freq: Once | INTRAVENOUS | Status: AC
  Administered 2023-08-16: 0.25 mg via INTRAVENOUS
  Filled 2023-08-16: qty 5

## 2023-08-16 MED ORDER — SODIUM CHLORIDE 0.9 % IV SOLN
10.0000 mg | Freq: Once | INTRAVENOUS | Status: AC
  Administered 2023-08-16: 10 mg via INTRAVENOUS
  Filled 2023-08-16: qty 10

## 2023-08-16 MED ORDER — SODIUM CHLORIDE 0.9% FLUSH
10.0000 mL | Freq: Once | INTRAVENOUS | Status: AC
  Administered 2023-08-16: 10 mL

## 2023-08-16 MED ORDER — SODIUM CHLORIDE 0.9 % IV SOLN
2400.0000 mg/m2 | INTRAVENOUS | Status: DC
  Administered 2023-08-16: 4450 mg via INTRAVENOUS
  Filled 2023-08-16: qty 89

## 2023-08-16 MED ORDER — HEPARIN SOD (PORK) LOCK FLUSH 100 UNIT/ML IV SOLN: 500.0000 [IU] | Freq: Once | INTRAVENOUS | Status: DC | PRN

## 2023-08-16 MED ORDER — SODIUM CHLORIDE 0.9 % IV SOLN: Freq: Once | INTRAVENOUS | Status: AC

## 2023-08-16 MED ORDER — SODIUM CHLORIDE 0.9% FLUSH: 10.0000 mL | INTRAVENOUS | Status: DC | PRN

## 2023-08-16 MED ORDER — SODIUM CHLORIDE 0.9 % IV SOLN: Freq: Once | INTRAVENOUS | Status: DC

## 2023-08-16 MED ORDER — SODIUM CHLORIDE 0.9 % IV SOLN
400.0000 mg/m2 | Freq: Once | INTRAVENOUS | Status: AC
  Administered 2023-08-16: 740 mg via INTRAVENOUS
  Filled 2023-08-16: qty 25

## 2023-08-16 NOTE — Patient Instructions (Signed)
[place] [facility]  Discharge Instructions: Thank you for choosing [facility] to provide your oncology and hematology care.   If you have a lab appointment with the Cancer Center, please go directly to the Cancer Center and check in at the registration area.   Wear comfortable clothing and clothing appropriate for easy access to any Portacath or PICC line.   We strive to give you quality time with your provider. You may need to reschedule your appointment if you arrive late (15 or more minutes).  Arriving late affects you and other patients whose appointments are after yours.  Also, if you miss three or more appointments without notifying the office, you may be dismissed from the clinic at the provider's discretion.      For prescription refill requests, have your pharmacy contact our office and allow 72 hours for refills to be completed.    Today you received the following chemotherapy and/or immunotherapy agents: Bevacizumab, Irinotecan, Leucovorin, Fluorouracil.       To help prevent nausea and vomiting after your treatment, we encourage you to take your nausea medication as directed.  BELOW ARE SYMPTOMS THAT SHOULD BE REPORTED IMMEDIATELY: *FEVER GREATER THAN 100.4 F (38 C) OR HIGHER *CHILLS OR SWEATING *NAUSEA AND VOMITING THAT IS NOT CONTROLLED WITH YOUR NAUSEA MEDICATION *UNUSUAL SHORTNESS OF BREATH *UNUSUAL BRUISING OR BLEEDING *URINARY PROBLEMS (pain or burning when urinating, or frequent urination) *BOWEL PROBLEMS (unusual diarrhea, constipation, pain near the anus) TENDERNESS IN MOUTH AND THROAT WITH OR WITHOUT PRESENCE OF ULCERS (sore throat, sores in mouth, or a toothache) UNUSUAL RASH, SWELLING OR PAIN  UNUSUAL VAGINAL DISCHARGE OR ITCHING   Items with * indicate a potential emergency and should be followed up as soon as possible or go to the Emergency Department if any problems should occur.  Please show the CHEMOTHERAPY ALERT  CARD or IMMUNOTHERAPY ALERT CARD at check-in to the Emergency Department and triage nurse.  Should you have questions after your visit or need to cancel or reschedule your appointment, please contact [place] [facility]  Dept: [phone]  and follow the prompts.  Office hours are 8:00 a.m. to 4:30 p.m. Monday - Friday. Please note that voicemails left after 4:00 p.m. may not be returned until the following business day.  We are closed weekends and major holidays. You have access to a nurse at all times for urgent questions. Please call the main number to the clinic Dept: [phone] and follow the prompts.   For any non-urgent questions, you may also contact your provider using MyChart. We now offer e-Visits for anyone 18 and older to request care online for non-urgent symptoms. For details visit [email or web address].   Also download the MyChart app! Go to the app store, search "MyChart", open the app, select [place], and log in with your MyChart username and password.   

## 2023-08-18 ENCOUNTER — Inpatient Hospital Stay: Payer: Medicare Other

## 2023-08-18 VITALS — BP 154/90 | HR 88 | Temp 97.5°F | Resp 16

## 2023-08-18 DIAGNOSIS — C20 Malignant neoplasm of rectum: Secondary | ICD-10-CM

## 2023-08-18 DIAGNOSIS — Z5112 Encounter for antineoplastic immunotherapy: Secondary | ICD-10-CM | POA: Diagnosis not present

## 2023-08-18 MED ORDER — PEGFILGRASTIM-JMDB 6 MG/0.6ML ~~LOC~~ SOSY
6.0000 mg | PREFILLED_SYRINGE | Freq: Once | SUBCUTANEOUS | Status: AC
  Administered 2023-08-18: 6 mg via SUBCUTANEOUS
  Filled 2023-08-18: qty 0.6

## 2023-08-18 MED ORDER — SODIUM CHLORIDE 0.9% FLUSH
10.0000 mL | INTRAVENOUS | Status: DC | PRN
  Administered 2023-08-18: 10 mL

## 2023-08-18 MED ORDER — HEPARIN SOD (PORK) LOCK FLUSH 100 UNIT/ML IV SOLN
500.0000 [IU] | Freq: Once | INTRAVENOUS | Status: AC | PRN
  Administered 2023-08-18: 500 [IU]

## 2023-08-23 ENCOUNTER — Encounter: Payer: Self-pay | Admitting: Hematology

## 2023-08-28 ENCOUNTER — Telehealth: Payer: Self-pay | Admitting: Hematology

## 2023-08-28 ENCOUNTER — Encounter: Payer: Self-pay | Admitting: Hematology

## 2023-08-28 ENCOUNTER — Other Ambulatory Visit: Payer: Self-pay

## 2023-08-28 DIAGNOSIS — C20 Malignant neoplasm of rectum: Secondary | ICD-10-CM

## 2023-08-28 DIAGNOSIS — Z515 Encounter for palliative care: Secondary | ICD-10-CM

## 2023-08-28 DIAGNOSIS — G893 Neoplasm related pain (acute) (chronic): Secondary | ICD-10-CM

## 2023-08-28 MED ORDER — OXYCODONE HCL 5 MG PO TABS: 5.0000 mg | ORAL_TABLET | Freq: Three times a day (TID) | ORAL | 0 refills | Status: DC | PRN

## 2023-08-28 NOTE — Telephone Encounter (Signed)
Pt called for refill, see associated orders

## 2023-08-30 ENCOUNTER — Ambulatory Visit: Payer: Medicare Other | Admitting: Nurse Practitioner

## 2023-08-30 ENCOUNTER — Ambulatory Visit: Payer: Medicare Other

## 2023-08-30 ENCOUNTER — Other Ambulatory Visit: Payer: Medicare Other

## 2023-09-01 ENCOUNTER — Inpatient Hospital Stay: Payer: Medicare Other

## 2023-09-12 NOTE — Assessment & Plan Note (Signed)
[id number] with lung metastasis, MMR normal, KRAS(+) [id number]  -diagnosed 08/2021 by colonoscopy for rectal bleeding and frequent BM. Staging MRI showed early extra mesorectal lymph node involvement.  -baseline CEA 571.84 on 09/06/21.  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 Xeloda dose reduced due to elevated liver enzymes.  -due to new liver lesions, we switched to second-line FOLFIRI, with continued beva, on 02/21/22. He tolerates well overall.  - restaging CT scan on April 22 and June 26 showed stable disease overall. -He is clinically doing well, diarrhea is well-controlled, no other significant side effect from treatment.   -Will continue current chemotherapy.  We previously discussed maintenance irinotecan alone, since he is tolerating FOLFIRI well, will continue for now.

## 2023-09-13 ENCOUNTER — Inpatient Hospital Stay (HOSPITAL_BASED_OUTPATIENT_CLINIC_OR_DEPARTMENT_OTHER): Payer: Medicare Other | Admitting: Hematology

## 2023-09-13 ENCOUNTER — Inpatient Hospital Stay: Payer: Medicare Other

## 2023-09-13 ENCOUNTER — Inpatient Hospital Stay: Payer: Medicare Other | Attending: Physician Assistant

## 2023-09-13 ENCOUNTER — Encounter: Payer: Self-pay | Admitting: Hematology

## 2023-09-13 VITALS — BP 139/76 | HR 65 | Temp 97.8°F | Resp 16 | Wt 149.6 lb

## 2023-09-13 DIAGNOSIS — R809 Proteinuria, unspecified: Secondary | ICD-10-CM | POA: Diagnosis not present

## 2023-09-13 DIAGNOSIS — C7800 Secondary malignant neoplasm of unspecified lung: Secondary | ICD-10-CM

## 2023-09-13 DIAGNOSIS — Z95828 Presence of other vascular implants and grafts: Secondary | ICD-10-CM

## 2023-09-13 DIAGNOSIS — C775 Secondary and unspecified malignant neoplasm of intrapelvic lymph nodes: Secondary | ICD-10-CM | POA: Diagnosis not present

## 2023-09-13 DIAGNOSIS — G893 Neoplasm related pain (acute) (chronic): Secondary | ICD-10-CM

## 2023-09-13 DIAGNOSIS — Z515 Encounter for palliative care: Secondary | ICD-10-CM | POA: Diagnosis not present

## 2023-09-13 DIAGNOSIS — Z5189 Encounter for other specified aftercare: Secondary | ICD-10-CM | POA: Insufficient documentation

## 2023-09-13 DIAGNOSIS — C7801 Secondary malignant neoplasm of right lung: Secondary | ICD-10-CM | POA: Diagnosis not present

## 2023-09-13 DIAGNOSIS — Z5111 Encounter for antineoplastic chemotherapy: Secondary | ICD-10-CM | POA: Insufficient documentation

## 2023-09-13 DIAGNOSIS — C20 Malignant neoplasm of rectum: Secondary | ICD-10-CM | POA: Diagnosis not present

## 2023-09-13 DIAGNOSIS — R197 Diarrhea, unspecified: Secondary | ICD-10-CM | POA: Diagnosis not present

## 2023-09-13 LAB — CMP (CANCER CENTER ONLY)
ALT: 18 U/L (ref 0–44)
AST: 24 U/L (ref 15–41)
Albumin: 3.5 g/dL (ref 3.5–5.0)
Alkaline Phosphatase: 149 U/L — ABNORMAL HIGH (ref 38–126)
Anion gap: 3 — ABNORMAL LOW (ref 5–15)
BUN: 11 mg/dL (ref 8–23)
CO2: 32 mmol/L (ref 22–32)
Calcium: 9 mg/dL (ref 8.9–10.3)
Chloride: 104 mmol/L (ref 98–111)
Creatinine: 0.76 mg/dL (ref 0.61–1.24)
GFR, Estimated: >60 mL/min (ref >60)
Glucose, Bld: 87 mg/dL (ref 70–99)
Potassium: 4.1 mmol/L (ref 3.5–5.1)
Sodium: 139 mmol/L (ref 135–145)
Total Bilirubin: 0.4 mg/dL (ref <1.2)
Total Protein: 6.8 g/dL (ref 6.5–8.1)

## 2023-09-13 LAB — CBC WITH DIFFERENTIAL (CANCER CENTER ONLY)
Abs Immature Granulocytes: 0.01 10*3/uL (ref 0.00–0.07)
Basophils Absolute: 0 10*3/uL (ref 0.0–0.1)
Basophils Relative: 1 %
Eosinophils Absolute: 0.2 10*3/uL (ref 0.0–0.5)
Eosinophils Relative: 5 %
HCT: 40.2 % (ref 39.0–52.0)
Hemoglobin: 13.2 g/dL (ref 13.0–17.0)
Immature Granulocytes: 0 %
Lymphocytes Relative: 23 %
Lymphs Abs: 1.1 10*3/uL (ref 0.7–4.0)
MCH: 28.8 pg (ref 26.0–34.0)
MCHC: 32.8 g/dL (ref 30.0–36.0)
MCV: 87.6 fL (ref 80.0–100.0)
Monocytes Absolute: 0.8 10*3/uL (ref 0.1–1.0)
Monocytes Relative: 17 %
Neutro Abs: 2.6 10*3/uL (ref 1.7–7.7)
Neutrophils Relative %: 54 %
Platelet Count: 157 10*3/uL (ref 150–400)
RBC: 4.59 MIL/uL (ref 4.22–5.81)
RDW: 17.1 % — ABNORMAL HIGH (ref 11.5–15.5)
WBC Count: 4.8 10*3/uL (ref 4.0–10.5)
nRBC: 0 % (ref 0.0–0.2)

## 2023-09-13 LAB — CEA (IN HOUSE-CHCC): CEA (CHCC-In House): 92.84 ng/mL — ABNORMAL HIGH (ref 0.00–5.00)

## 2023-09-13 LAB — TOTAL PROTEIN, URINE DIPSTICK: Protein, ur: 300 mg/dL — AB

## 2023-09-13 MED ORDER — SODIUM CHLORIDE 0.9% FLUSH
10.0000 mL | Freq: Once | INTRAVENOUS | Status: AC
  Administered 2023-09-13: 10 mL

## 2023-09-13 MED ORDER — HEPARIN SOD (PORK) LOCK FLUSH 100 UNIT/ML IV SOLN: 500.0000 [IU] | Freq: Once | INTRAVENOUS | Status: DC | PRN

## 2023-09-13 MED ORDER — ATROPINE SULFATE 1 MG/ML IV SOLN
0.4000 mg | Freq: Once | INTRAVENOUS | Status: AC
Start: 2023-09-13 — End: 2023-09-13
  Administered 2023-09-13: 0.4 mg via INTRAVENOUS
  Filled 2023-09-13: qty 1

## 2023-09-13 MED ORDER — SODIUM CHLORIDE 0.9 % IV SOLN
2400.0000 mg/m2 | INTRAVENOUS | Status: DC
  Administered 2023-09-13: 4450 mg via INTRAVENOUS
  Filled 2023-09-13: qty 89

## 2023-09-13 MED ORDER — DEXAMETHASONE SODIUM PHOSPHATE 10 MG/ML IJ SOLN
10.0000 mg | Freq: Once | INTRAMUSCULAR | Status: AC
  Administered 2023-09-13: 10 mg via INTRAVENOUS
  Filled 2023-09-13: qty 1

## 2023-09-13 MED ORDER — PALONOSETRON HCL INJECTION 0.25 MG/5ML
0.2500 mg | Freq: Once | INTRAVENOUS | Status: AC
  Administered 2023-09-13: 0.25 mg via INTRAVENOUS
  Filled 2023-09-13: qty 5

## 2023-09-13 MED ORDER — SODIUM CHLORIDE 0.9% FLUSH
10.0000 mL | INTRAVENOUS | Status: DC | PRN
Start: 2023-09-13 — End: 2023-09-13

## 2023-09-13 MED ORDER — OXYCODONE HCL 5 MG PO TABS: 5.0000 mg | ORAL_TABLET | Freq: Three times a day (TID) | ORAL | 0 refills | Status: DC | PRN

## 2023-09-13 MED ORDER — IRINOTECAN HCL CHEMO INJECTION 100 MG/5ML
140.0000 mg/m2 | Freq: Once | INTRAVENOUS | Status: AC
  Administered 2023-09-13: 260 mg via INTRAVENOUS
  Filled 2023-09-13: qty 13

## 2023-09-13 MED ORDER — LEUCOVORIN CALCIUM INJECTION 350 MG
400.0000 mg/m2 | Freq: Once | INTRAMUSCULAR | Status: AC
  Administered 2023-09-13: 740 mg via INTRAVENOUS
  Filled 2023-09-13: qty 17.5

## 2023-09-13 MED ORDER — SODIUM CHLORIDE 0.9 % IV SOLN: Freq: Once | INTRAVENOUS | Status: AC

## 2023-09-13 MED ORDER — DIPHENOXYLATE-ATROPINE 2.5-0.025 MG PO TABS: 1.0000 | ORAL_TABLET | Freq: Four times a day (QID) | ORAL | 1 refills | Status: DC | PRN

## 2023-09-13 NOTE — Progress Notes (Signed)
[facility]   Telephone:(336) [phone] Fax:(336) [phone]   Clinic Follow up Note   Patient Care Team: Pcp, No as PCP - General Pickenpack-Cousar, Arty Baumgartner, NP as Nurse Practitioner (Nurse Practitioner) Malachy Mood, MD as Consulting Physician (Hematology)  Date of Service:  09/13/2023  CHIEF COMPLAINT: f/u of metastatic rectal cancer  CURRENT THERAPY:  FOLFIRI and bevacizumab every 2 weeks  Oncology History   Rectal cancer (HCC) [phone] with lung metastasis, MMR normal, KRAS(+) [id number]  -diagnosed 08/2021 by colonoscopy for rectal bleeding and frequent BM. Staging MRI showed early extra mesorectal lymph node involvement.  -baseline CEA 571.84 on 09/06/21.  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 Xeloda dose reduced due to elevated liver enzymes.  -due to new liver lesions, we switched to second-line FOLFIRI, with continued beva, on 02/21/22. He tolerates well overall.  - restaging CT scan on April 22 and June 26 showed stable disease overall. -He is clinically doing well, diarrhea is well-controlled, no other significant side effect from treatment.   -Will continue current chemotherapy.  We previously discussed maintenance irinotecan alone, since he is tolerating FOLFIRI well, will continue for now.    Assessment and Plan    Metastatic Colon Cancer Stable on current regimen. Last CT scan was on September 20th, 2024. Next scan due in November or December 2024. -Continue current regimen.  Due to her high urine protein, will hold bevacizumab today -We again reviewed the management of diarrhea and other side effects  Proteinuria Elevated protein in urine. Bevacizumab held due to high proteinuria. -Hold Bevacizumab due to high proteinuria. May resume next time if proteinuria improves.  Diarrhea Recent episode of severe  diarrhea. Currently on Lomotil. -Continue Lomotil as needed for diarrhea.  Hypertension On Amlodipine. Refill available at the pharmacy. -Continue Amlodipine as prescribed.  Pain Management On Oxycodone, up to 2 tablets daily. Last refill was on October 24th, 2024. -Refill Oxycodone prescription. -Schedule appointment with [facility] for pain management follow-up.     Plan -Lab reviewed, adequate for treatment, will proceed to chemo FOLFIRI today, hold bevacizumab due to proteinuria -Lab and follow-up in 2 weeks before next cycle chemo, will order restaging CT scan on next visit -I refilled his oxycodone and Lomotil    SUMMARY OF ONCOLOGIC HISTORY: Oncology History Overview Note  Cancer Staging Rectal cancer [facility]) Staging form: Colon and Rectum, AJCC 8th Edition - Clinical stage from 08/29/2021: Jorge [name], cM1 - Signed by Malachy Mood, MD on 08/29/2021    Rectal cancer (HCC)  07/23/2021 Imaging   CT AP  IMPRESSION: 1. Appearance of the rectum likely indicates a rectal mass suspicious for rectal carcinoma. Inflammatory process would be less likely. Direct visualization is suggested. 2. Cholelithiasis without evidence of acute cholecystitis. 3. Aortic atherosclerosis.   08/19/2021 Procedure   Colonoscopy, under Dr. Meridee Score  Impression: - Rectal tenderness, palpable rectal mass and hemorrhoids found on digital rectal exam. - Stool in the entire examined colon - lavaged with still inadequate clearance. - One 35 mm polyp at the hepatic flexure. Biopsied. Tattooed distal in case future endoscopic resection is considered - however, has larger issues with rectal mass currently as below. - Four, 5 to 11 mm polyps in the transverse colon and at the hepatic flexure, removed with a cold snare. Resected and retrieved. - Diverticulosis in the recto-sigmoid colon and in the sigmoid colon. - Rule out malignancy, partially  obstructing tumor in the rectum and from 6 to 13 cm proximal to the  anus. Biopsied.   08/19/2021 Pathology Results   Diagnosis 1. Hepatic Flexure Biopsy - TUBULAR ADENOMA WITHOUT HIGH-GRADE DYSPLASIA OR MALIGNANCY 2. Transverse Colon Biopsy, and hepatic flexure, polyps (4) - TUBULAR ADENOMA WITHOUT HIGH-GRADE DYSPLASIA OR MALIGNANCY - OTHER FRAGMENTS OF POLYPOID COLONIC MUCOSA WITH NO SPECIFIC HISTOPATHOLOGIC CHANGES - FOOD MATERIAL 3. Rectum, biopsy - ADENOCARCINOMA. SEE NOTE   08/22/2021 Initial Diagnosis   Rectal cancer (HCC)   08/26/2021 Imaging   IMPRESSION: 1. A 2.2 x 1.9 cm right middle lobe pulmonary nodule as well as a total of four right upper lobe pulmonary nodule and masses measuring up to 3.6 cm. Findings concerning for metastatic primary lung cancer versus less likely metastases in a patient with rectal cancer. Additional imaging evaluation or consultation with Pulmonology or Thoracic Surgery recommended. 2. No gross hilar adenopathy, noting limited sensitivity for the detection of hilar adenopathy on this noncontrast study. 3. Cholelithiasis. 4.  Emphysema (ICD10-J43.9). 5. At least left anterior descending coronary artery calcifications.   08/27/2021 Imaging   IMPRESSION: Rectal adenocarcinoma T stage: T4 B   Rectal adenocarcinoma N stage: N2 disease likely associated with early extra mesorectal lymph node involvement.   Distance from tumor to the internal anal sphincter is 1.2 cm.   Also with extramural venous involvement as described.   08/29/2021 Cancer Staging   Staging form: Colon and Rectum, AJCC 8th Edition - Clinical stage from 08/29/2021: Jorge [name], cM1 - Signed by Malachy Mood, MD on 08/29/2021 Stage prefix: Initial diagnosis   01/23/2022 Imaging   CT CAP w contrast IMPRESSION: 1. Mild interval decrease in size of multiple right-sided pulmonary nodules. No new suspicious pulmonary nodule or mass. 2. Interval development of approximately 10 new ill-defined hypoattenuating liver lesions ranging in size from  approximately 5-10 mm. Imaging features suspicious for metastatic disease. MRI abdomen with and without contrast may prove helpful to further evaluate. 3. Similar appearance of wall thickening in the rectum with perirectal edema. 4. Cholelithiasis. 5. Prostatomegaly. 6. Aortic Atherosclerosis (ICD10-I70.0).   11/21/2022 Imaging    IMPRESSION: 1. Stable exam. No new or progressive interval findings. 2. The primary rectosigmoid lesion described previously is not well seen on the current study. There is presacral and perirectal edema, likely treatment related. 3. No substantial change in appearance of bilateral pulmonary metastases. 4. Stable tiny hypodensities in both hepatic lobes. These were previously characterized as metastatic disease. No new liver lesions on today's exam. 5. Cholelithiasis. 6. 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  History of Present Illness   The patient, with a history of metastatic colon cancer, presents for a follow-up visit. He reports a recent painful episode of diarrhea, which occurred about a week ago. The patient is currently on medication for diarrhea, blood pressure, and pain management. The patient is  taking Lomedia for diarrhea and amlodipine for blood pressure, both prescribed by his primary care physician. He also takes oxycodone for pain  management, which he reports taking at most two times a day.         All other systems were reviewed with the patient and are negative.  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    SURGICAL HISTORY: Past Surgical History:  Procedure Laterality Date   BRONCHIAL BIOPSY  09/27/2021   Procedure: BRONCHIAL BIOPSIES;  Surgeon: Josephine Igo, DO;  Location: [facility];  Service: Pulmonary;;   BRONCHIAL BRUSHINGS  09/27/2021   Procedure: BRONCHIAL BRUSHINGS;  Surgeon: Josephine Igo, DO;  Location: [facility];  Service: Pulmonary;;   BRONCHIAL NEEDLE ASPIRATION BIOPSY  09/27/2021   Procedure: BRONCHIAL NEEDLE ASPIRATION BIOPSIES;  Surgeon: Josephine Igo, DO;  Location: [facility];  Service: Pulmonary;;   COLONOSCOPY     IR IMAGING GUIDED PORT INSERTION  10/24/2021   left breast surgery Left 1968   VIDEO BRONCHOSCOPY WITH RADIAL ENDOBRONCHIAL ULTRASOUND  09/27/2021   Procedure: RADIAL ENDOBRONCHIAL ULTRASOUND;  Surgeon: Josephine Igo, DO;  Location: [facility];  Service: Pulmonary;;    I have reviewed the social history and family history with the patient and they are unchanged from previous note.  ALLERGIES:  has No Known Allergies.  MEDICATIONS:  Current Outpatient Medications  Medication Sig Dispense Refill   amLODipine (NORVASC) 10 MG tablet Take 1 tablet (10 mg total) by mouth daily. 90 tablet 1   azithromycin (ZITHROMAX) 250 MG tablet Take 2 tabs for first day then 1 tab daily for 4 more days (Patient not taking: Reported on 08/16/2023) 6 each 0   carbonyl iron (FEOSOL) 45 MG TABS tablet Take 45 mg by mouth daily. (Patient not taking: Reported on 08/16/2023)     diphenoxylate-atropine (LOMOTIL) 2.5-0.025 MG tablet Take 1-2 tablets by mouth 4 (four) times daily as needed for diarrhea or loose stools. 120 tablet 1   gabapentin (NEURONTIN) 100 MG capsule Take 2  capsules (200 mg total) by mouth 3 (three) times daily. 180 capsule 1   Multiple Vitamin (MULTIVITAMIN WITH MINERALS) TABS tablet Take 1 tablet by mouth daily.     ondansetron (ZOFRAN) 8 MG tablet Take 1 tablet (8 mg total) by mouth every 8 (eight) hours as needed for nausea or vomiting. 30 tablet 3   oxyCODONE (OXY IR/ROXICODONE) 5 MG immediate release tablet Take 1 tablet (5 mg total) by mouth every 8 (eight) hours as needed for severe pain (pain score 7-10) or breakthrough pain. 45 tablet 0   prochlorperazine (COMPAZINE) 10 MG tablet Take 1 tablet (10 mg total) by mouth every 6 (six) hours as needed (Nausea or vomiting). 30 tablet 1   No current facility-administered medications for this visit.   Facility-Administered Medications Ordered in Other Visits  Medication Dose Route Frequency Provider Last Rate Last Admin   fluorouracil (ADRUCIL) 4,450 mg in sodium chloride 0.9 % 61 mL chemo infusion  2,400 mg/m2 (Treatment Plan Recorded) Intravenous 1 day or 1 dose Malachy Mood, MD   Infusion Verify at 09/13/23 1332   heparin lock flush 100 unit/mL  500 Units Intracatheter Once PRN Malachy Mood, MD       sodium chloride flush (NS) 0.9 % injection 10 mL  10 mL Intracatheter PRN Malachy Mood, MD        PHYSICAL EXAMINATION: ECOG PERFORMANCE STATUS:  1 - Symptomatic but completely ambulatory  Vitals:   09/13/23 0929  BP: 139/76  Pulse: 65  Resp: 16  Temp: 97.8 F (36.6 C)  SpO2: 98%   Wt Readings from Last 3 Encounters:  09/13/23 149 lb 9.6 oz (67.9 kg)  08/16/23 142 lb 1 oz (64.4 kg)  08/02/23 144 lb 12.8 oz (65.7 kg)     GENERAL:alert, no distress and comfortable SKIN: skin color, texture, turgor are normal, no rashes or significant lesions EYES: normal, Conjunctiva are pink and non-injected, sclera clear NECK: supple, thyroid normal size, non-tender, without nodularity LYMPH:  no palpable lymphadenopathy in the cervical, axillary  LUNGS: clear to auscultation and percussion with normal  breathing effort HEART: regular rate & rhythm and no murmurs and no lower extremity edema ABDOMEN:abdomen soft, non-tender and normal bowel sounds Musculoskeletal:no cyanosis of digits and no clubbing  NEURO: alert & oriented x 3 with fluent speech, no focal motor/sensory deficits    LABORATORY DATA:  I have reviewed the data as listed    Latest Ref Rng & Units 09/13/2023    9:05 AM 08/16/2023    8:50 AM 08/02/2023    8:50 AM  CBC  WBC 4.0 - 10.5 K/uL 4.8  6.0  7.1   Hemoglobin 13.0 - 17.0 g/dL 62.1  30.8  65.7   Hematocrit 39.0 - 52.0 % 40.2  41.7  40.5   Platelets 150 - 400 K/uL 157  153  136         Latest Ref Rng & Units 09/13/2023    9:05 AM 08/16/2023    8:50 AM 08/02/2023    8:50 AM  CMP  Glucose 70 - 99 mg/dL 87  95  98   BUN 8 - 23 mg/dL 11  12  13    Creatinine 0.61 - 1.24 mg/dL 8.46  9.62  9.52   Sodium 135 - 145 mmol/L 139  138  139   Potassium 3.5 - 5.1 mmol/L 4.1  4.2  4.2   Chloride 98 - 111 mmol/L 104  106  106   CO2 22 - 32 mmol/L 32  29  30   Calcium 8.9 - 10.3 mg/dL 9.0  9.0  8.7   Total Protein 6.5 - 8.1 g/dL 6.8  6.8  6.5   Total Bilirubin <1.2 mg/dL 0.4  0.3  0.3   Alkaline Phos 38 - 126 U/L 149  187  216   AST 15 - 41 U/L 24  28  28    ALT 0 - 44 U/L 18  20  21        RADIOGRAPHIC STUDIES: I have personally reviewed the radiological images as listed and agreed with the findings in the report. No results found.    Orders Placed This Encounter  Procedures   CBC with Differential (Cancer Center Only)    Standing Status:   Future    Standing Expiration Date:   10/24/2024   CMP (Cancer Center only)    Standing Status:   Future    Standing Expiration Date:   10/24/2024   Total Protein, Urine dipstick    Standing Status:   Future    Standing Expiration Date:   10/24/2024   CBC with Differential (Cancer Center Only)    Standing Status:   Future    Standing Expiration Date:   11/07/2024   CMP (Cancer Center only)    Standing Status:   Future     Standing Expiration Date:   11/07/2024   Total Protein,  Urine dipstick    Standing Status:   Future    Standing Expiration Date:   11/07/2024   All questions were answered. The patient knows to call the clinic with any problems, questions or concerns. No barriers to learning was detected. The total time spent in the appointment was 30 minutes.     Malachy Mood, MD 09/13/2023

## 2023-09-13 NOTE — Patient Instructions (Addendum)
[facility] - A DEPT OF [facility]  Discharge Instructions: Thank you for choosing [facility] to provide your oncology and hematology care.   If you have a lab appointment with the Cancer Center, please go directly to the Cancer Center and check in at the registration area.   Wear comfortable clothing and clothing appropriate for easy access to any Portacath or PICC line.   We strive to give you quality time with your provider. You may need to reschedule your appointment if you arrive late (15 or more minutes).  Arriving late affects you and other patients whose appointments are after yours.  Also, if you miss three or more appointments without notifying the office, you may be dismissed from the clinic at the provider's discretion.      For prescription refill requests, have your pharmacy contact our office and allow 72 hours for refills to be completed.    Today you received the following chemotherapy and/or immunotherapy agents: Irinotecan, Leucovorin, and Fluorouracil.     To help prevent nausea and vomiting after your treatment, we encourage you to take your nausea medication as directed.  BELOW ARE SYMPTOMS THAT SHOULD BE REPORTED IMMEDIATELY: *FEVER GREATER THAN 100.4 F (38 C) OR HIGHER *CHILLS OR SWEATING *NAUSEA AND VOMITING THAT IS NOT CONTROLLED WITH YOUR NAUSEA MEDICATION *UNUSUAL SHORTNESS OF BREATH *UNUSUAL BRUISING OR BLEEDING *URINARY PROBLEMS (pain or burning when urinating, or frequent urination) *BOWEL PROBLEMS (unusual diarrhea, constipation, pain near the anus) TENDERNESS IN MOUTH AND THROAT WITH OR WITHOUT PRESENCE OF ULCERS (sore throat, sores in mouth, or a toothache) UNUSUAL RASH, SWELLING OR PAIN  UNUSUAL VAGINAL DISCHARGE OR ITCHING   Items with * indicate a potential emergency and should be followed up as soon as possible or go to the Emergency Department if any problems should occur.  Please show the  CHEMOTHERAPY ALERT CARD or IMMUNOTHERAPY ALERT CARD at check-in to the Emergency Department and triage nurse.  Should you have questions after your visit or need to cancel or reschedule your appointment, please contact [place] - A DEPT OF [facility]  Dept: [phone]  and follow the prompts.  Office hours are 8:00 a.m. to 4:30 p.m. Monday - Friday. Please note that voicemails left after 4:00 p.m. may not be returned until the following business day.  We are closed weekends and major holidays. You have access to a nurse at all times for urgent questions. Please call the main number to the clinic Dept: [phone] and follow the prompts.   For any non-urgent questions, you may also contact your provider using MyChart. We now offer e-Visits for anyone 48 and older to request care online for non-urgent symptoms. For details visit [email or web address].   Also download the MyChart app! Go to the app store, search "MyChart", open the app, select [place], and log in with your MyChart username and password. The chemotherapy medication bag should finish at 46 hours, 96 hours, or 7 days. For example, if your pump is scheduled for 46 hours and it was put on at 4:00 p.m., it should finish at 2:00 p.m. the day it is scheduled to come off regardless of your appointment time.     Estimated time to finish at: 11:15 PM   If the display on your pump reads "Low Volume" and it is beeping, take the batteries out of the pump and come to the cancer center for it to be taken  off.   If the pump alarms go off prior to the pump reading "Low Volume" then call [phone] and someone can assist you.  If the plunger comes out and the chemotherapy medication is leaking out, please use your home chemo spill kit to clean up the spill. Do NOT use paper towels or other household products.  If you have problems or questions regarding your pump, please call either [phone] (24  hours a day) or the cancer center Monday-Friday 8:00 a.m.- 4:30 p.m. at the clinic number and we will assist you. If you are unable to get assistance, then go to the nearest Emergency Department and ask the staff to contact the IV team for assistance.

## 2023-09-15 ENCOUNTER — Inpatient Hospital Stay: Payer: Medicare Other

## 2023-09-15 VITALS — BP 165/68 | HR 82 | Temp 97.5°F | Resp 16

## 2023-09-15 DIAGNOSIS — Z5111 Encounter for antineoplastic chemotherapy: Secondary | ICD-10-CM | POA: Diagnosis not present

## 2023-09-15 DIAGNOSIS — C7800 Secondary malignant neoplasm of unspecified lung: Secondary | ICD-10-CM

## 2023-09-15 MED ORDER — HEPARIN SOD (PORK) LOCK FLUSH 100 UNIT/ML IV SOLN
500.0000 [IU] | Freq: Once | INTRAVENOUS | Status: AC | PRN
Start: 2023-09-15 — End: 2023-09-15
  Administered 2023-09-15: 500 [IU]

## 2023-09-15 MED ORDER — SODIUM CHLORIDE 0.9% FLUSH
10.0000 mL | INTRAVENOUS | Status: DC | PRN
  Administered 2023-09-15: 10 mL

## 2023-09-15 MED ORDER — PEGFILGRASTIM-JMDB 6 MG/0.6ML ~~LOC~~ SOSY
6.0000 mg | PREFILLED_SYRINGE | Freq: Once | SUBCUTANEOUS | Status: AC
  Administered 2023-09-15: 6 mg via SUBCUTANEOUS

## 2023-09-17 ENCOUNTER — Other Ambulatory Visit: Payer: Self-pay

## 2023-09-17 DIAGNOSIS — C20 Malignant neoplasm of rectum: Secondary | ICD-10-CM

## 2023-09-18 ENCOUNTER — Telehealth: Payer: Self-pay

## 2023-09-18 NOTE — Telephone Encounter (Signed)
-----   Message from Malachy Mood sent at 09/17/2023  7:38 AM EST ----- Please let pt know his tumor marker has significantly increased lately, please order restaging CT CAP and schedule it ASAP. Thanks   Malachy Mood

## 2023-09-18 NOTE — Telephone Encounter (Signed)
Pt advised of lab results and recommendations.    He has been scheduled for a CT scan on Friday 11/15 at [facility] hospital. Pt advised of appt via My Chart message

## 2023-09-21 ENCOUNTER — Encounter (HOSPITAL_COMMUNITY): Payer: Self-pay

## 2023-09-21 ENCOUNTER — Ambulatory Visit (HOSPITAL_COMMUNITY)
Admission: RE | Admit: 2023-09-21 | Discharge: 2023-09-21 | Disposition: A | Discharge status: Home / Self Care | Payer: Medicare Other | Source: Ambulatory Visit | Attending: Hematology | Admitting: Hematology

## 2023-09-21 MED ORDER — IOHEXOL 9 MG/ML PO SOLN: 1000.0000 mL | ORAL | Status: AC

## 2023-09-24 NOTE — Progress Notes (Unsigned)
Palliative Medicine [facility]  Telephone:(336) [phone] Fax:(336) [phone]   Name: Jorge [name] Date: 09/24/2023 MRN: [id number]  DOB: Oct 25, 1951  Patient Care Team: Pcp, No as PCP - General Pickenpack-Cousar, Arty Baumgartner, NP as Nurse Practitioner (Nurse Practitioner) Malachy Mood, MD as Consulting Physician (Hematology)   INTERVAL HISTORY: Jorge [name] is a 72 y.o. 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  Palliative ask to see for symptom management and goals of care.   SOCIAL HISTORY:     reports that he has been smoking cigarettes. He has a 5 pack-year smoking history. He has never used smokeless tobacco. He reports that he does not currently use alcohol. He reports that he does not use drugs.  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  MEDICATIONS:  Current Outpatient Medications  Medication Sig Dispense Refill   amLODipine (NORVASC) 10 MG tablet Take 1 tablet (10 mg total) by mouth daily. 90 tablet 1   azithromycin (ZITHROMAX) 250 MG tablet Take 2 tabs for first day then 1 tab daily for 4 more days (Patient not taking: Reported on 08/16/2023) 6 each 0   carbonyl iron (FEOSOL) 45 MG TABS tablet Take 45 mg by mouth daily. (Patient not taking: Reported on 08/16/2023)     diphenoxylate-atropine (LOMOTIL) 2.5-0.025 MG tablet Take 1-2 tablets by mouth 4 (four) times daily as needed for diarrhea or loose stools. 120 tablet 1   gabapentin (NEURONTIN) 100 MG capsule Take 2 capsules (200 mg total) by mouth 3 (three) times daily. 180 capsule 1   Multiple Vitamin (MULTIVITAMIN WITH MINERALS) TABS tablet Take 1 tablet by mouth daily.     ondansetron (ZOFRAN) 8 MG tablet Take 1 tablet (8 mg total) by  mouth every 8 (eight) hours as needed for nausea or vomiting. 30 tablet 3   oxyCODONE (OXY IR/ROXICODONE) 5 MG immediate release tablet Take 1 tablet (5 mg total) by mouth every 8 (eight) hours as needed for severe pain (pain score 7-10) or breakthrough pain. 45 tablet 0   prochlorperazine (COMPAZINE) 10 MG tablet Take 1 tablet (10 mg total) by mouth every 6 (six) hours as needed (Nausea or vomiting). 30 tablet 1   No current facility-administered medications for this visit.    VITAL SIGNS: There were no vitals taken for this visit. There were no vitals filed for this visit.   Estimated body mass index is 20.29 kg/m as calculated from the following:   Height as of 08/02/23: 6' (1.829 m).   Weight as of 09/13/23: 149 lb 9.6 oz (67.9 kg).   PERFORMANCE STATUS (ECOG) : 1 - Symptomatic but completely ambulatory  Assessment NAD, ambulatory RRR Normal breathing pattern  AAO x3, mood appropriate    IMPRESSION: Jorge [name] presents to clinic for follow-up. No acute distress. He is feeling well overall. Denies nausea, vomiting, constipation. Appetite is good. Continues to remain as active as possible.   Neuropathic Pain  Mr. Schoenbachler reports his pain is well controlled on current regimen. Some days continue to be better than others. Tolerating gabapentin and oxycodone with noticeable improvement to his to neuropathic pain.  He states his pain is improving and seems to be dormant at longer lengths. He is much appreciative of  this.   No changes to current regimen.   Will continue to closely monitor and support as needed.   PLAN:  Gabapentin 300mg  three times daily.   Oxy IR 5mg  every 8 hours as needed. Not requiring around the clock.  I will plan to see patient back in 3-4 weeks in collaboration with his oncology appointments.   Patient expressed understanding and was in agreement with this plan. He also understands that He can call the clinic at any time with any questions, concerns, or  complaints.     Any controlled substances utilized were prescribed in the context of palliative care. PDMP has been reviewed.    Visit consisted of counseling and education dealing with the complex and emotionally intense issues of symptom management and palliative care in the setting of serious and potentially life-threatening illness.  Willette Alma, AGPCNP-BC  Palliative Medicine Team/[facility]  *Please note that this is a verbal dictation therefore any spelling or grammatical errors are due to the "[employer] One" system interpretation.

## 2023-09-25 ENCOUNTER — Other Ambulatory Visit: Payer: Self-pay | Admitting: Hematology

## 2023-09-25 DIAGNOSIS — Z515 Encounter for palliative care: Secondary | ICD-10-CM

## 2023-09-25 DIAGNOSIS — C20 Malignant neoplasm of rectum: Secondary | ICD-10-CM

## 2023-09-25 DIAGNOSIS — G893 Neoplasm related pain (acute) (chronic): Secondary | ICD-10-CM

## 2023-09-25 MED ORDER — OXYCODONE HCL 5 MG PO TABS: 5.0000 mg | ORAL_TABLET | Freq: Three times a day (TID) | ORAL | 0 refills | Status: DC | PRN

## 2023-09-26 ENCOUNTER — Other Ambulatory Visit: Payer: Self-pay

## 2023-09-26 DIAGNOSIS — C20 Malignant neoplasm of rectum: Secondary | ICD-10-CM

## 2023-09-26 NOTE — Assessment & Plan Note (Signed)
[id number] with lung metastasis, MMR normal, KRAS(+) [id number]  -diagnosed 08/2021 by colonoscopy for rectal bleeding and frequent BM. Staging MRI showed early extra mesorectal lymph node involvement.  -baseline CEA 571.84 on 09/06/21.  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 Xeloda dose reduced due to elevated liver enzymes.  -due to new liver lesions, we switched to second-line FOLFIRI, with continued beva, on 02/21/22. He tolerates well overall.  - restaging CT scan on April 22 and June 26 showed stable disease overall. -He is clinically doing well, diarrhea is well-controlled, no other significant side effect from treatment.   -Will continue current chemotherapy.  We previously discussed maintenance irinotecan alone, since he is tolerating FOLFIRI well, will continue for now.

## 2023-09-27 ENCOUNTER — Inpatient Hospital Stay (HOSPITAL_BASED_OUTPATIENT_CLINIC_OR_DEPARTMENT_OTHER): Payer: Medicare Other | Admitting: Nurse Practitioner

## 2023-09-27 ENCOUNTER — Inpatient Hospital Stay: Payer: Medicare Other

## 2023-09-27 ENCOUNTER — Other Ambulatory Visit: Payer: Self-pay

## 2023-09-27 ENCOUNTER — Inpatient Hospital Stay (HOSPITAL_BASED_OUTPATIENT_CLINIC_OR_DEPARTMENT_OTHER): Payer: Medicare Other | Admitting: Hematology

## 2023-09-27 ENCOUNTER — Encounter: Payer: Self-pay | Admitting: Nurse Practitioner

## 2023-09-27 VITALS — BP 147/74 | HR 66 | Temp 98.2°F | Ht 72.0 in | Wt 149.0 lb

## 2023-09-27 DIAGNOSIS — M792 Neuralgia and neuritis, unspecified: Secondary | ICD-10-CM | POA: Diagnosis not present

## 2023-09-27 DIAGNOSIS — Z5111 Encounter for antineoplastic chemotherapy: Secondary | ICD-10-CM | POA: Diagnosis not present

## 2023-09-27 DIAGNOSIS — C20 Malignant neoplasm of rectum: Secondary | ICD-10-CM

## 2023-09-27 DIAGNOSIS — C7800 Secondary malignant neoplasm of unspecified lung: Secondary | ICD-10-CM

## 2023-09-27 DIAGNOSIS — Z515 Encounter for palliative care: Secondary | ICD-10-CM | POA: Diagnosis not present

## 2023-09-27 DIAGNOSIS — G893 Neoplasm related pain (acute) (chronic): Secondary | ICD-10-CM | POA: Diagnosis not present

## 2023-09-27 DIAGNOSIS — Z95828 Presence of other vascular implants and grafts: Secondary | ICD-10-CM

## 2023-09-27 LAB — CMP (CANCER CENTER ONLY)
ALT: 16 U/L (ref 0–44)
AST: 24 U/L (ref 15–41)
Albumin: 3.5 g/dL (ref 3.5–5.0)
Alkaline Phosphatase: 169 U/L — ABNORMAL HIGH (ref 38–126)
Anion gap: 3 — ABNORMAL LOW (ref 5–15)
BUN: 8 mg/dL (ref 8–23)
CO2: 31 mmol/L (ref 22–32)
Calcium: 8.9 mg/dL (ref 8.9–10.3)
Chloride: 103 mmol/L (ref 98–111)
Creatinine: 0.84 mg/dL (ref 0.61–1.24)
GFR, Estimated: >60 mL/min (ref >60)
Glucose, Bld: 105 mg/dL — ABNORMAL HIGH (ref 70–99)
Potassium: 4.1 mmol/L (ref 3.5–5.1)
Sodium: 137 mmol/L (ref 135–145)
Total Bilirubin: 0.5 mg/dL (ref <1.2)
Total Protein: 6.7 g/dL (ref 6.5–8.1)

## 2023-09-27 LAB — CBC WITH DIFFERENTIAL (CANCER CENTER ONLY)
Abs Immature Granulocytes: 0.02 10*3/uL (ref 0.00–0.07)
Basophils Absolute: 0 10*3/uL (ref 0.0–0.1)
Basophils Relative: 1 %
Eosinophils Absolute: 0.1 10*3/uL (ref 0.0–0.5)
Eosinophils Relative: 3 %
HCT: 43 % (ref 39.0–52.0)
Hemoglobin: 13.5 g/dL (ref 13.0–17.0)
Immature Granulocytes: 0 %
Lymphocytes Relative: 19 %
Lymphs Abs: 1 10*3/uL (ref 0.7–4.0)
MCH: 28.2 pg (ref 26.0–34.0)
MCHC: 31.4 g/dL (ref 30.0–36.0)
MCV: 90 fL (ref 80.0–100.0)
Monocytes Absolute: 0.5 10*3/uL (ref 0.1–1.0)
Monocytes Relative: 10 %
Neutro Abs: 3.5 10*3/uL (ref 1.7–7.7)
Neutrophils Relative %: 67 %
Platelet Count: 155 10*3/uL (ref 150–400)
RBC: 4.78 MIL/uL (ref 4.22–5.81)
RDW: 17.9 % — ABNORMAL HIGH (ref 11.5–15.5)
WBC Count: 5.1 10*3/uL (ref 4.0–10.5)
nRBC: 0 % (ref 0.0–0.2)

## 2023-09-27 LAB — TOTAL PROTEIN, URINE DIPSTICK: Protein, ur: 100 mg/dL — AB

## 2023-09-27 MED ORDER — SODIUM CHLORIDE 0.9% FLUSH: 10.0000 mL | INTRAVENOUS | Status: DC | PRN

## 2023-09-27 MED ORDER — SODIUM CHLORIDE 0.9 % IV SOLN
2400.0000 mg/m2 | INTRAVENOUS | Status: DC
  Administered 2023-09-27: 4450 mg via INTRAVENOUS
  Filled 2023-09-27: qty 89

## 2023-09-27 MED ORDER — SODIUM CHLORIDE 0.9 % IV SOLN
5.0000 mg/kg | Freq: Once | INTRAVENOUS | Status: AC
  Administered 2023-09-27: 350 mg via INTRAVENOUS
  Filled 2023-09-27: qty 14

## 2023-09-27 MED ORDER — DEXAMETHASONE SODIUM PHOSPHATE 10 MG/ML IJ SOLN
10.0000 mg | Freq: Once | INTRAMUSCULAR | Status: AC
  Administered 2023-09-27: 10 mg via INTRAVENOUS
  Filled 2023-09-27: qty 1

## 2023-09-27 MED ORDER — PALONOSETRON HCL INJECTION 0.25 MG/5ML
0.2500 mg | Freq: Once | INTRAVENOUS | Status: AC
  Administered 2023-09-27: 0.25 mg via INTRAVENOUS
  Filled 2023-09-27: qty 5

## 2023-09-27 MED ORDER — SODIUM CHLORIDE 0.9 % IV SOLN
140.0000 mg/m2 | Freq: Once | INTRAVENOUS | Status: AC
  Administered 2023-09-27: 260 mg via INTRAVENOUS
  Filled 2023-09-27: qty 13

## 2023-09-27 MED ORDER — GABAPENTIN 100 MG PO CAPS: 200.0000 mg | ORAL_CAPSULE | Freq: Three times a day (TID) | ORAL | 2 refills | Status: DC

## 2023-09-27 MED ORDER — SODIUM CHLORIDE 0.9 % IV SOLN
400.0000 mg/m2 | Freq: Once | INTRAVENOUS | Status: AC
  Administered 2023-09-27: 740 mg via INTRAVENOUS
  Filled 2023-09-27: qty 25

## 2023-09-27 MED ORDER — SODIUM CHLORIDE 0.9 % IV SOLN: Freq: Once | INTRAVENOUS | Status: AC

## 2023-09-27 MED ORDER — ATROPINE SULFATE 1 MG/ML IV SOLN
0.4000 mg | Freq: Once | INTRAVENOUS | Status: AC
  Administered 2023-09-27: 0.4 mg via INTRAVENOUS
  Filled 2023-09-27: qty 1

## 2023-09-27 MED ORDER — SODIUM CHLORIDE 0.9% FLUSH
10.0000 mL | Freq: Once | INTRAVENOUS | Status: AC
  Administered 2023-09-27: 10 mL

## 2023-09-27 MED ORDER — HEPARIN SOD (PORK) LOCK FLUSH 100 UNIT/ML IV SOLN
500.0000 [IU] | Freq: Once | INTRAVENOUS | Status: DC | PRN
Start: 2023-09-27 — End: 2023-09-27

## 2023-09-27 NOTE — Progress Notes (Signed)
Per Dr Mosetta Putt, okay to treat with urine protein 100.

## 2023-09-27 NOTE — Progress Notes (Signed)
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 Staging MRI showed early extra mesorectal lymph node involvement.  -baseline CEA 571.84 on 09/06/21.  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 Xeloda dose reduced due to elevated liver enzymes.  -due to new liver lesions, we switched to second-line FOLFIRI, with continued beva, on 02/21/22. He tolerates well overall.  - restaging CT scan on April 22 and June 26 showed stable disease overall. -He is clinically doing well, diarrhea is well-controlled, no other significant side effect from treatment.   -Will continue current chemotherapy.  We previously discussed maintenance irinotecan alone, since he is tolerating FOLFIRI well, will continue for now.    Assessment and Plan    Metastatic Colon Cancer   Tumor marker increased from 20 to 92, indicating potential disease progression. Missed CT scan due to transportation issues and misunderstanding about timing and contrast requirement. Pain is manageable, and he is active in daily activities. Discussed the need for a CT scan to assess disease progression and determine if treatment change is necessary.   - Reschedule CT scan   - Instruct daughter to call and schedule CT scan   - Continue current chemotherapy regimen   - Monitor  tumor markers and symptoms   - Follow-up after CT scan results to decide on further treatment    Diarrhea   Manages symptoms with Imodium as needed. No current need for Lomotil.   - Continue Imodium as needed   - Monitor symptoms and refill Lomotil if necessary    Plan -Lab reviewed, adequate for treatment, will proceed to chemo today -Reschedule his CT scan in next few weeks -Follow-up in 2 weeks before next cycle chemo -He will see palliative care NP Nikki today       SUMMARY OF ONCOLOGIC HISTORY: Oncology History Overview Note  Cancer Staging Rectal cancer [facility]) Staging form: Colon and Rectum, AJCC 8th Edition - Clinical stage from 08/29/2021: [name], cM1 - Signed by Malachy Mood, MD on 08/29/2021    Rectal cancer (HCC)  07/23/2021 Imaging   CT AP  IMPRESSION: 1. Appearance of the rectum likely indicates a rectal mass suspicious for rectal carcinoma. Inflammatory process would be less likely. Direct visualization is suggested. 2. Cholelithiasis without evidence of acute cholecystitis. 3. Aortic atherosclerosis.   08/19/2021 Procedure   Colonoscopy, under Dr. Meridee Score  Impression: - Rectal tenderness, palpable rectal mass and hemorrhoids found on digital rectal exam. - Stool in the entire examined colon - lavaged with still inadequate clearance. - One 35 mm polyp at the hepatic flexure. Biopsied. Tattooed distal in case future endoscopic resection is considered - however, has larger issues with rectal mass currently as below. - Four, 5 to 11 mm polyps in the transverse colon and at the hepatic flexure, removed with a cold snare. Resected and retrieved. - Diverticulosis in the recto-sigmoid colon and in the sigmoid colon. - Rule out  malignancy, partially obstructing tumor in the rectum and from 6 to 13 cm proximal to the anus. Biopsied.   08/19/2021 Pathology Results   Diagnosis 1. Hepatic Flexure Biopsy - TUBULAR ADENOMA WITHOUT HIGH-GRADE DYSPLASIA OR  MALIGNANCY 2. Transverse Colon Biopsy, and hepatic flexure, polyps (4) - TUBULAR ADENOMA WITHOUT HIGH-GRADE DYSPLASIA OR MALIGNANCY - OTHER FRAGMENTS OF POLYPOID COLONIC MUCOSA WITH NO SPECIFIC HISTOPATHOLOGIC CHANGES - FOOD MATERIAL 3. Rectum, biopsy - ADENOCARCINOMA. SEE NOTE   08/22/2021 Initial Diagnosis   Rectal cancer (HCC)   08/26/2021 Imaging   IMPRESSION: 1. A 2.2 x 1.9 cm right middle lobe pulmonary nodule as well as a total of four right upper lobe pulmonary nodule and masses measuring up to 3.6 cm. Findings concerning for metastatic primary lung cancer versus less likely metastases in a patient with rectal cancer. Additional imaging evaluation or consultation with Pulmonology or Thoracic Surgery recommended. 2. No gross hilar adenopathy, noting limited sensitivity for the detection of hilar adenopathy on this noncontrast study. 3. Cholelithiasis. 4.  Emphysema (ICD10-J43.9). 5. At least left anterior descending coronary artery calcifications.   08/27/2021 Imaging   IMPRESSION: Rectal adenocarcinoma T stage: T4 B   Rectal adenocarcinoma N stage: N2 disease likely associated with early extra mesorectal lymph node involvement.   Distance from tumor to the internal anal sphincter is 1.2 cm.   Also with extramural venous involvement as described.   08/29/2021 Cancer Staging   Staging form: Colon and Rectum, AJCC 8th Edition - Clinical stage from 08/29/2021: [name], cM1 - Signed by Malachy Mood, MD on 08/29/2021 Stage prefix: Initial diagnosis   01/23/2022 Imaging   CT CAP w contrast IMPRESSION: 1. Mild interval decrease in size of multiple right-sided pulmonary nodules. No new suspicious pulmonary nodule or mass. 2. Interval development of approximately 10 new ill-defined hypoattenuating liver lesions ranging in size from approximately 5-10 mm. Imaging features suspicious for metastatic disease. MRI abdomen with and without contrast may prove helpful to  further evaluate. 3. Similar appearance of wall thickening in the rectum with perirectal edema. 4. Cholelithiasis. 5. Prostatomegaly. 6. Aortic Atherosclerosis (ICD10-I70.0).   11/21/2022 Imaging    IMPRESSION: 1. Stable exam. No new or progressive interval findings. 2. The primary rectosigmoid lesion described previously is not well seen on the current study. There is presacral and perirectal edema, likely treatment related. 3. No substantial change in appearance of bilateral pulmonary metastases. 4. Stable tiny hypodensities in both hepatic lobes. These were previously characterized as metastatic disease. No new liver lesions on today's exam. 5. Cholelithiasis. 6. 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  History of Present Illness   The patient, with a history of metastatic colon cancer, presents for a follow-up visit. He reports a missed CT scan appointment due to scheduling conflicts and transportation issues. The patient expresses willingness to reschedule the scan and will coordinate with his daughter  for transportation. He denies any trouble with his last chemotherapy session. The patient also mentions that he has been walking a lot lately, except for the current morning due to cold weather. He is actively involved in taking care of his grandchildren. He denies needing any  medication refills at the moment, including the diarrhea medicine. The patient also reports stable neuropathy.         All other systems were reviewed with the patient and are negative.  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    SURGICAL HISTORY: Past Surgical History:  Procedure Laterality Date   BRONCHIAL BIOPSY  09/27/2021   Procedure: BRONCHIAL BIOPSIES;  Surgeon: Josephine Igo, DO;  Location: [facility];  Service: Pulmonary;;   BRONCHIAL BRUSHINGS  09/27/2021   Procedure: BRONCHIAL BRUSHINGS;  Surgeon: Josephine Igo, DO;  Location: [facility];  Service: Pulmonary;;   BRONCHIAL NEEDLE ASPIRATION BIOPSY  09/27/2021   Procedure: BRONCHIAL NEEDLE ASPIRATION BIOPSIES;  Surgeon: Josephine Igo, DO;  Location: [facility];  Service: Pulmonary;;   COLONOSCOPY     IR IMAGING GUIDED PORT INSERTION  10/24/2021   left breast surgery Left 1968   VIDEO BRONCHOSCOPY WITH RADIAL ENDOBRONCHIAL ULTRASOUND  09/27/2021   Procedure: RADIAL ENDOBRONCHIAL ULTRASOUND;  Surgeon: Josephine Igo, DO;  Location: [facility]OS[facility];  Service: Pulmonary;;    I have reviewed the social history and family history with the patient and they are unchanged from previous note.  ALLERGIES:  has No Known Allergies.  MEDICATIONS:  Current Outpatient Medications  Medication Sig Dispense Refill   amLODipine (NORVASC) 10 MG tablet Take 1 tablet (10 mg total) by mouth daily. 90 tablet 1   diphenoxylate-atropine (LOMOTIL) 2.5-0.025 MG tablet Take 1-2 tablets by mouth 4 (four) times daily as needed for diarrhea or loose stools. 120 tablet 1   gabapentin (NEURONTIN) 100 MG capsule Take 2 capsules (200 mg total) by mouth 3 (three) times daily. 180 capsule 2   Multiple Vitamin (MULTIVITAMIN WITH MINERALS) TABS tablet Take 1 tablet by mouth daily.     ondansetron (ZOFRAN) 8 MG tablet Take 1 tablet (8 mg  total) by mouth every 8 (eight) hours as needed for nausea or vomiting. 30 tablet 3   oxyCODONE (OXY IR/ROXICODONE) 5 MG immediate release tablet Take 1 tablet (5 mg total) by mouth every 8 (eight) hours as needed for severe pain (pain score 7-10) or breakthrough pain. 45 tablet 0   prochlorperazine (COMPAZINE) 10 MG tablet Take 1 tablet (10 mg total) by mouth every 6 (six) hours as needed (Nausea or vomiting). 30 tablet 1   No current facility-administered medications for this visit.   Facility-Administered Medications Ordered in Other Visits  Medication Dose Route Frequency Provider Last Rate Last Admin   atropine injection 0.4 mg  0.4 mg Intravenous Once Malachy Mood, MD       bevacizumab-adcd [facility]) 350 mg in sodium chloride 0.9 % 100 mL chemo infusion  5 mg/kg (Treatment Plan Recorded) Intravenous Once Malachy Mood, MD 684 mL/hr at 09/27/23 1110 350 mg at 09/27/23 1110   fluorouracil (ADRUCIL) 4,450 mg in sodium chloride 0.9 % 61 mL chemo infusion  2,400 mg/m2 (Treatment Plan Recorded) Intravenous 1 day or 1 dose Malachy Mood, MD       heparin lock flush 100 unit/mL  500 Units Intracatheter Once PRN Malachy Mood, MD       irinotecan (CAMPTOSAR) 260  mg in sodium chloride 0.9 % 500 mL chemo infusion  140 mg/m2 (Treatment Plan Recorded) Intravenous Once Malachy Mood, MD       leucovorin 740 mg in sodium chloride 0.9 % 250 mL infusion  400 mg/m2 (Treatment Plan Recorded) Intravenous Once Malachy Mood, MD       sodium chloride flush (NS) 0.9 % injection 10 mL  10 mL Intracatheter PRN Malachy Mood, MD        PHYSICAL EXAMINATION: ECOG PERFORMANCE STATUS: 1 - Symptomatic but completely ambulatory  Vitals:   09/27/23 0922  BP: (!) 147/74  Pulse: 66  Temp: 98.2 F (36.8 C)  SpO2: 98%   Wt Readings from Last 3 Encounters:  09/27/23 149 lb (67.6 kg)  09/13/23 149 lb 9.6 oz (67.9 kg)  08/16/23 142 lb 1 oz (64.4 kg)     GENERAL:alert, no distress and comfortable SKIN: skin color, texture, turgor are  normal, no rashes or significant lesions EYES: normal, Conjunctiva are pink and non-injected, sclera clear NECK: supple, thyroid normal size, non-tender, without nodularity LYMPH:  no palpable lymphadenopathy in the cervical, axillary  LUNGS: clear to auscultation and percussion with normal breathing effort HEART: regular rate & rhythm and no murmurs and no lower extremity edema ABDOMEN:abdomen soft, non-tender and normal bowel sounds Musculoskeletal:no cyanosis of digits and no clubbing  NEURO: alert & oriented x 3 with fluent speech, no focal motor/sensory deficits   LABORATORY DATA:  I have reviewed the data as listed    Latest Ref Rng & Units 09/27/2023    8:47 AM 09/13/2023    9:05 AM 08/16/2023    8:50 AM  CBC  WBC 4.0 - 10.5 K/uL 5.1  4.8  6.0   Hemoglobin 13.0 - 17.0 g/dL 16.1  09.6  04.5   Hematocrit 39.0 - 52.0 % 43.0  40.2  41.7   Platelets 150 - 400 K/uL 155  157  153         Latest Ref Rng & Units 09/27/2023    8:47 AM 09/13/2023    9:05 AM 08/16/2023    8:50 AM  CMP  Glucose 70 - 99 mg/dL 409  87  95   BUN 8 - 23 mg/dL 8  11  12    Creatinine 0.61 - 1.24 mg/dL 8.11  9.14  7.82   Sodium 135 - 145 mmol/L 137  139  138   Potassium 3.5 - 5.1 mmol/L 4.1  4.1  4.2   Chloride 98 - 111 mmol/L 103  104  106   CO2 22 - 32 mmol/L 31  32  29   Calcium 8.9 - 10.3 mg/dL 8.9  9.0  9.0   Total Protein 6.5 - 8.1 g/dL 6.7  6.8  6.8   Total Bilirubin <1.2 mg/dL 0.5  0.4  0.3   Alkaline Phos 38 - 126 U/L 169  149  187   AST 15 - 41 U/L 24  24  28    ALT 0 - 44 U/L 16  18  20        RADIOGRAPHIC STUDIES: I have personally reviewed the radiological images as listed and agreed with the findings in the report. No results found.    No orders of the defined types were placed in this encounter.  All questions were answered. The patient knows to call the clinic with any problems, questions or concerns. No barriers to learning was detected. The total time spent in the appointment  was 25 minutes.     Terrace Arabia  Mosetta Putt, MD 09/27/2023

## 2023-09-29 ENCOUNTER — Inpatient Hospital Stay: Payer: Medicare Other

## 2023-09-29 VITALS — BP 136/80 | HR 76 | Temp 97.2°F | Resp 18

## 2023-09-29 DIAGNOSIS — C7800 Secondary malignant neoplasm of unspecified lung: Secondary | ICD-10-CM

## 2023-09-29 DIAGNOSIS — Z5111 Encounter for antineoplastic chemotherapy: Secondary | ICD-10-CM | POA: Diagnosis not present

## 2023-09-29 MED ORDER — PEGFILGRASTIM-JMDB 6 MG/0.6ML ~~LOC~~ SOSY
6.0000 mg | PREFILLED_SYRINGE | Freq: Once | SUBCUTANEOUS | Status: AC
Start: 2023-09-29 — End: 2023-09-29
  Administered 2023-09-29: 6 mg via SUBCUTANEOUS

## 2023-09-29 MED ORDER — SODIUM CHLORIDE 0.9% FLUSH
10.0000 mL | INTRAVENOUS | Status: DC | PRN
  Administered 2023-09-29: 10 mL

## 2023-09-29 MED ORDER — HEPARIN SOD (PORK) LOCK FLUSH 100 UNIT/ML IV SOLN
500.0000 [IU] | Freq: Once | INTRAVENOUS | Status: AC | PRN
  Administered 2023-09-29: 500 [IU]

## 2023-10-08 ENCOUNTER — Other Ambulatory Visit: Payer: Self-pay | Admitting: Nurse Practitioner

## 2023-10-08 DIAGNOSIS — C7800 Secondary malignant neoplasm of unspecified lung: Secondary | ICD-10-CM

## 2023-10-08 DIAGNOSIS — Z515 Encounter for palliative care: Secondary | ICD-10-CM

## 2023-10-08 DIAGNOSIS — G893 Neoplasm related pain (acute) (chronic): Secondary | ICD-10-CM

## 2023-10-08 MED ORDER — OXYCODONE HCL 5 MG PO TABS: 5.0000 mg | ORAL_TABLET | Freq: Three times a day (TID) | ORAL | 0 refills | Status: DC | PRN

## 2023-10-09 ENCOUNTER — Other Ambulatory Visit: Payer: Self-pay | Admitting: Hematology

## 2023-10-09 ENCOUNTER — Ambulatory Visit (HOSPITAL_COMMUNITY)
Admission: RE | Admit: 2023-10-09 | Discharge: 2023-10-09 | Disposition: A | Discharge status: Home / Self Care | Payer: Medicare Other | Source: Ambulatory Visit | Attending: Hematology | Admitting: Hematology

## 2023-10-09 ENCOUNTER — Encounter (HOSPITAL_COMMUNITY): Payer: Self-pay

## 2023-10-09 DIAGNOSIS — C20 Malignant neoplasm of rectum: Secondary | ICD-10-CM | POA: Diagnosis present

## 2023-10-09 DIAGNOSIS — C7800 Secondary malignant neoplasm of unspecified lung: Secondary | ICD-10-CM | POA: Diagnosis present

## 2023-10-09 MED ORDER — HEPARIN SOD (PORK) LOCK FLUSH 100 UNIT/ML IV SOLN
INTRAVENOUS | Status: AC
  Filled 2023-10-09: qty 5

## 2023-10-09 MED ORDER — HEPARIN SOD (PORK) LOCK FLUSH 100 UNIT/ML IV SOLN: 500.0000 [IU] | Freq: Once | INTRAVENOUS | Status: DC

## 2023-10-09 MED ORDER — IOHEXOL 300 MG/ML  SOLN: 100.0000 mL | Freq: Once | INTRAMUSCULAR | Status: DC | PRN

## 2023-10-09 MED ORDER — IOHEXOL 300 MG/ML  SOLN
30.0000 mL | Freq: Once | INTRAMUSCULAR | Status: AC | PRN
  Administered 2023-10-09: 30 mL via ORAL

## 2023-10-11 ENCOUNTER — Inpatient Hospital Stay (HOSPITAL_BASED_OUTPATIENT_CLINIC_OR_DEPARTMENT_OTHER): Payer: Medicare Other | Attending: Physician Assistant | Admitting: Hematology

## 2023-10-11 ENCOUNTER — Encounter: Payer: Self-pay | Admitting: Hematology

## 2023-10-11 ENCOUNTER — Inpatient Hospital Stay: Payer: Medicare Other | Attending: Physician Assistant

## 2023-10-11 ENCOUNTER — Inpatient Hospital Stay: Payer: Medicare Other

## 2023-10-11 VITALS — BP 149/78 | HR 72 | Temp 98.9°F | Resp 16

## 2023-10-11 VITALS — BP 148/78 | HR 69 | Temp 98.1°F | Resp 17 | Wt 146.4 lb

## 2023-10-11 DIAGNOSIS — R197 Diarrhea, unspecified: Secondary | ICD-10-CM | POA: Diagnosis not present

## 2023-10-11 DIAGNOSIS — C7800 Secondary malignant neoplasm of unspecified lung: Secondary | ICD-10-CM

## 2023-10-11 DIAGNOSIS — Z5111 Encounter for antineoplastic chemotherapy: Secondary | ICD-10-CM | POA: Insufficient documentation

## 2023-10-11 DIAGNOSIS — C775 Secondary and unspecified malignant neoplasm of intrapelvic lymph nodes: Secondary | ICD-10-CM | POA: Insufficient documentation

## 2023-10-11 DIAGNOSIS — Z5189 Encounter for other specified aftercare: Secondary | ICD-10-CM | POA: Diagnosis not present

## 2023-10-11 DIAGNOSIS — C20 Malignant neoplasm of rectum: Secondary | ICD-10-CM

## 2023-10-11 DIAGNOSIS — C7801 Secondary malignant neoplasm of right lung: Secondary | ICD-10-CM | POA: Insufficient documentation

## 2023-10-11 DIAGNOSIS — Z5112 Encounter for antineoplastic immunotherapy: Secondary | ICD-10-CM | POA: Insufficient documentation

## 2023-10-11 DIAGNOSIS — Z515 Encounter for palliative care: Secondary | ICD-10-CM

## 2023-10-11 DIAGNOSIS — Z95828 Presence of other vascular implants and grafts: Secondary | ICD-10-CM

## 2023-10-11 LAB — CMP (CANCER CENTER ONLY)
ALT: 15 U/L (ref 0–44)
AST: 22 U/L (ref 15–41)
Albumin: 3.6 g/dL (ref 3.5–5.0)
Alkaline Phosphatase: 165 U/L — ABNORMAL HIGH (ref 38–126)
Anion gap: 5 (ref 5–15)
BUN: 8 mg/dL (ref 8–23)
CO2: 32 mmol/L (ref 22–32)
Calcium: 8.9 mg/dL (ref 8.9–10.3)
Chloride: 102 mmol/L (ref 98–111)
Creatinine: 0.8 mg/dL (ref 0.61–1.24)
GFR, Estimated: >60 mL/min (ref >60)
Glucose, Bld: 123 mg/dL — ABNORMAL HIGH (ref 70–99)
Potassium: 3.6 mmol/L (ref 3.5–5.1)
Sodium: 139 mmol/L (ref 135–145)
Total Bilirubin: 0.4 mg/dL (ref <1.2)
Total Protein: 6.4 g/dL — ABNORMAL LOW (ref 6.5–8.1)

## 2023-10-11 LAB — CBC WITH DIFFERENTIAL (CANCER CENTER ONLY)
Abs Immature Granulocytes: 0.03 10*3/uL (ref 0.00–0.07)
Basophils Absolute: 0 10*3/uL (ref 0.0–0.1)
Basophils Relative: 0 %
Eosinophils Absolute: 0.2 10*3/uL (ref 0.0–0.5)
Eosinophils Relative: 3 %
HCT: 43.6 % (ref 39.0–52.0)
Hemoglobin: 13.8 g/dL (ref 13.0–17.0)
Immature Granulocytes: 0 %
Lymphocytes Relative: 13 %
Lymphs Abs: 0.9 10*3/uL (ref 0.7–4.0)
MCH: 28.4 pg (ref 26.0–34.0)
MCHC: 31.7 g/dL (ref 30.0–36.0)
MCV: 89.7 fL (ref 80.0–100.0)
Monocytes Absolute: 0.7 10*3/uL (ref 0.1–1.0)
Monocytes Relative: 11 %
Neutro Abs: 4.9 10*3/uL (ref 1.7–7.7)
Neutrophils Relative %: 73 %
Platelet Count: 172 10*3/uL (ref 150–400)
RBC: 4.86 MIL/uL (ref 4.22–5.81)
RDW: 17.2 % — ABNORMAL HIGH (ref 11.5–15.5)
WBC Count: 6.7 10*3/uL (ref 4.0–10.5)
nRBC: 0 % (ref 0.0–0.2)

## 2023-10-11 LAB — TOTAL PROTEIN, URINE DIPSTICK: Protein, ur: 100 mg/dL — AB

## 2023-10-11 MED ORDER — SODIUM CHLORIDE 0.9 % IV SOLN
2400.0000 mg/m2 | INTRAVENOUS | Status: DC
  Administered 2023-10-11: 4450 mg via INTRAVENOUS
  Filled 2023-10-11: qty 89

## 2023-10-11 MED ORDER — ATROPINE SULFATE 1 MG/ML IV SOLN
INTRAVENOUS | Status: AC
  Filled 2023-10-11: qty 1

## 2023-10-11 MED ORDER — ATROPINE SULFATE 1 MG/ML IV SOLN
0.4000 mg | Freq: Once | INTRAVENOUS | Status: AC
  Administered 2023-10-11: 0.4 mg via INTRAVENOUS
  Filled 2023-10-11: qty 1

## 2023-10-11 MED ORDER — SODIUM CHLORIDE 0.9% FLUSH: 10.0000 mL | INTRAVENOUS | Status: DC | PRN

## 2023-10-11 MED ORDER — SODIUM CHLORIDE 0.9 % IV SOLN
140.0000 mg/m2 | Freq: Once | INTRAVENOUS | Status: AC
  Administered 2023-10-11: 260 mg via INTRAVENOUS
  Filled 2023-10-11: qty 13

## 2023-10-11 MED ORDER — DEXAMETHASONE SODIUM PHOSPHATE 10 MG/ML IJ SOLN
10.0000 mg | Freq: Once | INTRAMUSCULAR | Status: AC
  Administered 2023-10-11: 10 mg via INTRAVENOUS
  Filled 2023-10-11: qty 1

## 2023-10-11 MED ORDER — SODIUM CHLORIDE 0.9 % IV SOLN
5.0000 mg/kg | Freq: Once | INTRAVENOUS | Status: AC
  Administered 2023-10-11: 350 mg via INTRAVENOUS
  Filled 2023-10-11: qty 14

## 2023-10-11 MED ORDER — SODIUM CHLORIDE 0.9% FLUSH
10.0000 mL | Freq: Once | INTRAVENOUS | Status: AC
  Administered 2023-10-11: 10 mL

## 2023-10-11 MED ORDER — LEUCOVORIN CALCIUM INJECTION 350 MG
400.0000 mg/m2 | Freq: Once | INTRAMUSCULAR | Status: AC
  Administered 2023-10-11: 740 mg via INTRAVENOUS
  Filled 2023-10-11: qty 25

## 2023-10-11 MED ORDER — SODIUM CHLORIDE 0.9 % IV SOLN: Freq: Once | INTRAVENOUS | Status: AC

## 2023-10-11 MED ORDER — DIPHENOXYLATE-ATROPINE 2.5-0.025 MG PO TABS: 1.0000 | ORAL_TABLET | Freq: Four times a day (QID) | ORAL | 2 refills | Status: DC | PRN

## 2023-10-11 MED ORDER — PALONOSETRON HCL INJECTION 0.25 MG/5ML
0.2500 mg | Freq: Once | INTRAVENOUS | Status: AC
  Administered 2023-10-11: 0.25 mg via INTRAVENOUS
  Filled 2023-10-11: qty 5

## 2023-10-11 NOTE — Progress Notes (Signed)
Bevacizumab diluted in NS100 mL, Lot:  [id number], Lot:  02/03/25  Richardean Sale, RPH, BCPS, BCOP 10/11/2023 9:53 AM

## 2023-10-11 NOTE — Patient Instructions (Signed)
[facility] - A DEPT OF [facility]  Discharge Instructions: Thank you for choosing [facility] to provide your oncology and hematology care.   If you have a lab appointment with the Cancer Center, please go directly to the Cancer Center and check in at the registration area.   Wear comfortable clothing and clothing appropriate for easy access to any Portacath or PICC line.   We strive to give you quality time with your provider. You may need to reschedule your appointment if you arrive late (15 or more minutes).  Arriving late affects you and other patients whose appointments are after yours.  Also, if you miss three or more appointments without notifying the office, you may be dismissed from the clinic at the provider's discretion.      For prescription refill requests, have your pharmacy contact our office and allow 72 hours for refills to be completed.    Today you received the following chemotherapy and/or immunotherapy agents: Bevacizumab, Irinotecan, Leucovorin, and Fluorouracil.      To help prevent nausea and vomiting after your treatment, we encourage you to take your nausea medication as directed.  BELOW ARE SYMPTOMS THAT SHOULD BE REPORTED IMMEDIATELY: *FEVER GREATER THAN 100.4 F (38 C) OR HIGHER *CHILLS OR SWEATING *NAUSEA AND VOMITING THAT IS NOT CONTROLLED WITH YOUR NAUSEA MEDICATION *UNUSUAL SHORTNESS OF BREATH *UNUSUAL BRUISING OR BLEEDING *URINARY PROBLEMS (pain or burning when urinating, or frequent urination) *BOWEL PROBLEMS (unusual diarrhea, constipation, pain near the anus) TENDERNESS IN MOUTH AND THROAT WITH OR WITHOUT PRESENCE OF ULCERS (sore throat, sores in mouth, or a toothache) UNUSUAL RASH, SWELLING OR PAIN  UNUSUAL VAGINAL DISCHARGE OR ITCHING   Items with * indicate a potential emergency and should be followed up as soon as possible or go to the Emergency Department if any problems should occur.  Please show  the CHEMOTHERAPY ALERT CARD or IMMUNOTHERAPY ALERT CARD at check-in to the Emergency Department and triage nurse.  Should you have questions after your visit or need to cancel or reschedule your appointment, please contact [facility] - A DEPT OF [facility]  Dept: [phone]  and follow the prompts.  Office hours are 8:00 a.m. to 4:30 p.m. Monday - Friday. Please note that voicemails left after 4:00 p.m. may not be returned until the following business day.  We are closed weekends and major holidays. You have access to a nurse at all times for urgent questions. Please call the main number to the clinic Dept: [phone] and follow the prompts.   For any non-urgent questions, you may also contact your provider using MyChart. We now offer e-Visits for anyone 48 and older to request care online for non-urgent symptoms. For details visit [email or web address].   Also download the MyChart app! Go to the app store, search "MyChart", open the app, select [place], and log in with your MyChart username and password.  The chemotherapy medication bag should finish at 46 hours, 96 hours, or 7 days. For example, if your pump is scheduled for 46 hours and it was put on at 4:00 p.m., it should finish at 2:00 p.m. the day it is scheduled to come off regardless of your appointment time.     Estimated time to finish at:    If the display on your pump reads "Low Volume" and it is beeping, take the batteries out of the pump and come to the cancer  center for it to be taken off.   If the pump alarms go off prior to the pump reading "Low Volume" then call [phone] and someone can assist you.  If the plunger comes out and the chemotherapy medication is leaking out, please use your home chemo spill kit to clean up the spill. Do NOT use paper towels or other household products.  If you have problems or questions regarding your pump, please call either [phone] (24 hours  a day) or the cancer center Monday-Friday 8:00 a.m.- 4:30 p.m. at the clinic number and we will assist you. If you are unable to get assistance, then go to the nearest Emergency Department and ask the staff to contact the IV team for assistance.

## 2023-10-11 NOTE — Progress Notes (Signed)
[facility]   Telephone:(336) [phone] Fax:(336) [phone]   Clinic Follow up Note   Patient Care Team: Pcp, No as PCP - General Pickenpack-Cousar, Arty Baumgartner, NP as Nurse Practitioner (Nurse Practitioner) Malachy Mood, MD as Consulting Physician (Hematology)  Date of Service:  10/11/2023  CHIEF COMPLAINT: f/u of metastatic rectal cancer  CURRENT THERAPY:  Secondary FOLFIRI and bevacizumab  Oncology History   Rectal cancer (HCC) [phone] with lung metastasis, MMR normal, KRAS(+) [id number]  -diagnosed 08/2021 by colonoscopy for rectal bleeding and frequent BM. Staging MRI showed early extra mesorectal lymph node involvement.  -baseline CEA 571.84 on 09/06/21.  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 Xeloda dose reduced due to elevated liver enzymes.  -due to new liver lesions, we switched to second-line FOLFIRI, with continued beva, on 02/21/22. He tolerates well overall.  - restaging CT scan on April 22 and June 26 showed stable disease overall. -He is clinically doing well, diarrhea is well-controlled, no other significant side effect from treatment.   -Will continue current chemotherapy.  We previously discussed maintenance irinotecan alone, since he is tolerating FOLFIRI well, will continue for now.  Assessment and Plan    Metastatic Rectal Cancer Follow-up for metastatic rectal cancer. Primary rectal tumor remains stable. Recent CT shows slightly worsening lung metastasis; liver lesions stable. Current chemotherapy regimen since April 2023, well-tolerated. Tumor markers slightly increased. Reports good appetite and energy, intermittent diarrhea likely due to chemotherapy and tumor. Asymptomatic for pain; neuropathy well-managed with gabapentin. Discussed switching to chemo pill Lonsurf plus bevacizumab, which may reduce diarrhea but  duration of response is short.  Due to the upcoming holiday, we decide to continue current regiment, check Guardant360 next time, to see if he has additional targetable mutation.  If negative, will probably change to [facility] and bevacizumab in January.   - Continue current chemotherapy regimen - Refill Lomotil for diarrhea management - Schedule next treatment on January 2 - Order Guardian 360 blood test at next visit - Reassess treatment regimen next year  Chemotherapy-Induced Diarrhea Diarrhea managed with Lomotil, likely due to chemotherapy and rectal tumor. - Refill Lomotil, instruct to take up to 4 times daily  Peripheral Neuropathy Neuropathy well-managed with gabapentin, no pain reported. - Continue gabapentin, no refill needed  General Health Maintenance Maintaining healthy diet, staying active, managing medications well, supportive family environment. - Encourage continued healthy eating and physical activity  Plan -CT scan reviewed, mild progression in lung -Will continue current regiment for this month.  Lab reviewed, adequate for treatment today, will proceed chemo today - Next appointment on December 19 - Schedule follow-up in two weeks.  -[id number] for next visit -Will likely change his treatment to [facility] and bevacizumab in January.      SUMMARY OF ONCOLOGIC HISTORY: Oncology History Overview Note  Cancer Staging Rectal cancer [facility]) Staging form: Colon and Rectum, AJCC 8th Edition - Clinical stage from 08/29/2021: [name], cM1 - Signed by Malachy Mood, MD on 08/29/2021    Rectal cancer (HCC)  07/23/2021 Imaging   CT AP  IMPRESSION: 1. Appearance of the rectum likely indicates a rectal mass suspicious for rectal carcinoma. Inflammatory process would be less likely. Direct visualization is suggested. 2. Cholelithiasis without evidence of acute cholecystitis. 3. Aortic atherosclerosis.   08/19/2021 Procedure   Colonoscopy, under Dr.  Meridee Score  Impression: - Rectal tenderness, palpable rectal mass and hemorrhoids found on  digital rectal exam. - Stool in the entire examined colon - lavaged with still inadequate clearance. - One 35 mm polyp at the hepatic flexure. Biopsied. Tattooed distal in case future endoscopic resection is considered - however, has larger issues with rectal mass currently as below. - Four, 5 to 11 mm polyps in the transverse colon and at the hepatic flexure, removed with a cold snare. Resected and retrieved. - Diverticulosis in the recto-sigmoid colon and in the sigmoid colon. - Rule out malignancy, partially obstructing tumor in the rectum and from 6 to 13 cm proximal to the anus. Biopsied.   08/19/2021 Pathology Results   Diagnosis 1. Hepatic Flexure Biopsy - TUBULAR ADENOMA WITHOUT HIGH-GRADE DYSPLASIA OR MALIGNANCY 2. Transverse Colon Biopsy, and hepatic flexure, polyps (4) - TUBULAR ADENOMA WITHOUT HIGH-GRADE DYSPLASIA OR MALIGNANCY - OTHER FRAGMENTS OF POLYPOID COLONIC MUCOSA WITH NO SPECIFIC HISTOPATHOLOGIC CHANGES - FOOD MATERIAL 3. Rectum, biopsy - ADENOCARCINOMA. SEE NOTE   08/22/2021 Initial Diagnosis   Rectal cancer (HCC)   08/26/2021 Imaging   IMPRESSION: 1. A 2.2 x 1.9 cm right middle lobe pulmonary nodule as well as a total of four right upper lobe pulmonary nodule and masses measuring up to 3.6 cm. Findings concerning for metastatic primary lung cancer versus less likely metastases in a patient with rectal cancer. Additional imaging evaluation or consultation with Pulmonology or Thoracic Surgery recommended. 2. No gross hilar adenopathy, noting limited sensitivity for the detection of hilar adenopathy on this noncontrast study. 3. Cholelithiasis. 4.  Emphysema (ICD10-J43.9). 5. At least left anterior descending coronary artery calcifications.   08/27/2021 Imaging   IMPRESSION: Rectal adenocarcinoma T stage: T4 B   Rectal adenocarcinoma N stage: N2 disease likely  associated with early extra mesorectal lymph node involvement.   Distance from tumor to the internal anal sphincter is 1.2 cm.   Also with extramural venous involvement as described.   08/29/2021 Cancer Staging   Staging form: Colon and Rectum, AJCC 8th Edition - Clinical stage from 08/29/2021: [name], cM1 - Signed by Malachy Mood, MD on 08/29/2021 Stage prefix: Initial diagnosis   01/23/2022 Imaging   CT CAP w contrast IMPRESSION: 1. Mild interval decrease in size of multiple right-sided pulmonary nodules. No new suspicious pulmonary nodule or mass. 2. Interval development of approximately 10 new ill-defined hypoattenuating liver lesions ranging in size from approximately 5-10 mm. Imaging features suspicious for metastatic disease. MRI abdomen with and without contrast may prove helpful to further evaluate. 3. Similar appearance of wall thickening in the rectum with perirectal edema. 4. Cholelithiasis. 5. Prostatomegaly. 6. Aortic Atherosclerosis (ICD10-I70.0).   11/21/2022 Imaging    IMPRESSION: 1. Stable exam. No new or progressive interval findings. 2. The primary rectosigmoid lesion described previously is not well seen on the current study. There is presacral and perirectal edema, likely treatment related. 3. No substantial change in appearance of bilateral pulmonary metastases. 4. Stable tiny hypodensities in both hepatic lobes. These were previously characterized as metastatic disease. No new liver lesions on today's exam. 5. Cholelithiasis. 6. 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  History of Present Illness   The patient, a 72 year old male with a history of metastatic rectal cancer, presents for a follow-up visit. The patient's primary concern is diarrhea, which has been managed with Lomotil. The patient reports that when he ran out of Lomotil, the diarrhea returned, indicating the medication's effectiveness. The patient takes Lomotil up to four times a day as needed.  The patient also reports neuropathy, which has improved significantly with gabapentin. The patient notes that he did not feel any pain during a recent walk, indicating the medication's effectiveness. The patient also takes oxycodone, which he reports works well in conjunction with gabapentin.  The patient denies any pain and reports a good appetite, although he notes that diarrhea episodes can disrupt his eating patterns. The patient has lost a few pounds since his last visit but maintains a good energy level and remains active, taking care of his grandchildren and managing household tasks.         All other systems were reviewed with the patient and are negative.  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    SURGICAL HISTORY: Past Surgical History:  Procedure Laterality Date   BRONCHIAL BIOPSY  09/27/2021   Procedure: BRONCHIAL BIOPSIES;  Surgeon: Josephine Igo, DO;  Location: [facility];  Service: Pulmonary;;   BRONCHIAL BRUSHINGS  09/27/2021   Procedure: BRONCHIAL BRUSHINGS;  Surgeon: Josephine Igo, DO;  Location: [facility];  Service: Pulmonary;;   BRONCHIAL NEEDLE ASPIRATION BIOPSY  09/27/2021   Procedure: BRONCHIAL NEEDLE ASPIRATION BIOPSIES;  Surgeon: Josephine Igo, DO;  Location: [facility];  Service: Pulmonary;;   COLONOSCOPY     IR IMAGING GUIDED PORT INSERTION  10/24/2021   left breast surgery Left 1968   VIDEO BRONCHOSCOPY WITH RADIAL ENDOBRONCHIAL ULTRASOUND  09/27/2021   Procedure: RADIAL ENDOBRONCHIAL ULTRASOUND;  Surgeon: Josephine Igo, DO;  Location: [facility];  Service: Pulmonary;;    I have reviewed the social history and family history with the patient and they are unchanged from previous note.  ALLERGIES:  has No Known Allergies.  MEDICATIONS:  Current Outpatient Medications  Medication Sig Dispense Refill   amLODipine (NORVASC) 10 MG tablet Take 1 tablet (10 mg total) by mouth daily. 90 tablet 1   diphenoxylate-atropine (LOMOTIL) 2.5-0.025 MG tablet Take 1-2 tablets by mouth 4 (four) times daily as needed for diarrhea or loose stools. 120 tablet 2   gabapentin (NEURONTIN) 100 MG capsule Take 2 capsules (200 mg total) by mouth 3 (three) times daily. 180 capsule 2   Multiple Vitamin (MULTIVITAMIN WITH MINERALS) TABS tablet Take 1 tablet by mouth daily.     ondansetron (ZOFRAN) 8 MG tablet Take 1 tablet (8 mg total) by mouth every 8 (eight) hours as needed for nausea or vomiting. 30 tablet 3   oxyCODONE (OXY IR/ROXICODONE) 5 MG immediate release tablet Take 1 tablet (5 mg total) by mouth every 8 (eight) hours as needed for severe pain (pain score 7-10) or breakthrough pain. 45 tablet 0   prochlorperazine (COMPAZINE) 10 MG tablet Take 1 tablet (10 mg total) by mouth every 6 (six) hours as needed (Nausea or vomiting). 30 tablet 1   No current facility-administered medications for  this visit.   Facility-Administered Medications Ordered in Other Visits  Medication Dose Route Frequency Provider Last Rate Last Admin   atropine 1 MG/ML injection            fluorouracil (ADRUCIL) 4,450 mg in sodium chloride 0.9 % 61 mL chemo infusion  2,400 mg/m2 (Treatment Plan Recorded) Intravenous 1 day or 1 dose Malachy Mood, MD   Infusion  Verify at 10/11/23 1317   sodium chloride flush (NS) 0.9 % injection 10 mL  10 mL Intracatheter PRN Malachy Mood, MD        PHYSICAL EXAMINATION: ECOG PERFORMANCE STATUS: 1 - Symptomatic but completely ambulatory  Vitals:   10/11/23 0834  BP: (!) 148/78  Pulse: 69  Resp: 17  Temp: 98.1 F (36.7 C)  SpO2: 96%   Wt Readings from Last 3 Encounters:  10/11/23 146 lb 6.4 oz (66.4 kg)  09/27/23 149 lb (67.6 kg)  09/13/23 149 lb 9.6 oz (67.9 kg)     GENERAL:alert, no distress and comfortable SKIN: skin color, texture, turgor are normal, no rashes or significant lesions EYES: normal, Conjunctiva are pink and non-injected, sclera clear NECK: supple, thyroid normal size, non-tender, without nodularity LYMPH:  no palpable lymphadenopathy in the cervical, axillary  LUNGS: clear to auscultation and percussion with normal breathing effort HEART: regular rate & rhythm and no murmurs and no lower extremity edema ABDOMEN:abdomen soft, non-tender and normal bowel sounds Musculoskeletal:no cyanosis of digits and no clubbing  NEURO: alert & oriented x 3 with fluent speech, no focal motor/sensory deficits    LABORATORY DATA:  I have reviewed the data as listed    Latest Ref Rng & Units 10/11/2023    8:02 AM 09/27/2023    8:47 AM 09/13/2023    9:05 AM  CBC  WBC 4.0 - 10.5 K/uL 6.7  5.1  4.8   Hemoglobin 13.0 - 17.0 g/dL 47.8  29.5  62.1   Hematocrit 39.0 - 52.0 % 43.6  43.0  40.2   Platelets 150 - 400 K/uL 172  155  157         Latest Ref Rng & Units 10/11/2023    8:02 AM 09/27/2023    8:47 AM 09/13/2023    9:05 AM  CMP  Glucose 70 - 99 mg/dL 308  657  87   BUN 8 - 23 mg/dL 8  8  11    Creatinine 0.61 - 1.24 mg/dL 8.46  9.62  9.52   Sodium 135 - 145 mmol/L 139  137  139   Potassium 3.5 - 5.1 mmol/L 3.6  4.1  4.1   Chloride 98 - 111 mmol/L 102  103  104   CO2 22 - 32 mmol/L 32  31  32   Calcium 8.9 - 10.3 mg/dL 8.9  8.9  9.0   Total Protein 6.5 - 8.1 g/dL 6.4  6.7  6.8   Total  Bilirubin <1.2 mg/dL 0.4  0.5  0.4   Alkaline Phos 38 - 126 U/L 165  169  149   AST 15 - 41 U/L 22  24  24    ALT 0 - 44 U/L 15  16  18        RADIOGRAPHIC STUDIES: I have personally reviewed the radiological images as listed and agreed with the findings in the report. No results found.    Orders Placed This Encounter  Procedures   CBC with Differential (Cancer Center Only)    Standing Status:   Future    Standing Expiration  Date:   11/21/2024   CMP (Cancer Center only)    Standing Status:   Future    Standing Expiration Date:   11/21/2024   Total Protein, Urine dipstick    Standing Status:   Future    Standing Expiration Date:   11/21/2024   CBC with Differential (Cancer Center Only)    Standing Status:   Future    Standing Expiration Date:   12/05/2024   CMP (Cancer Center only)    Standing Status:   Future    Standing Expiration Date:   12/05/2024   Total Protein, Urine dipstick    Standing Status:   Future    Standing Expiration Date:   12/05/2024   CBC with Differential (Cancer Center Only)    Standing Status:   Future    Standing Expiration Date:   12/19/2024   CMP (Cancer Center only)    Standing Status:   Future    Standing Expiration Date:   12/19/2024   Total Protein, Urine dipstick    Standing Status:   Future    Standing Expiration Date:   12/19/2024   Guardant 360    Standing Status:   Future    Standing Expiration Date:   10/10/2024   All questions were answered. The patient knows to call the clinic with any problems, questions or concerns. No barriers to learning was detected. The total time spent in the appointment was 30 minutes.     Malachy Mood, MD 10/11/2023

## 2023-10-11 NOTE — Assessment & Plan Note (Signed)
[id number] with lung metastasis, MMR normal, KRAS(+) [id number]  -diagnosed 08/2021 by colonoscopy for rectal bleeding and frequent BM. Staging MRI showed early extra mesorectal lymph node involvement.  -baseline CEA 571.84 on 09/06/21.  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 Xeloda dose reduced due to elevated liver enzymes.  -due to new liver lesions, we switched to second-line FOLFIRI, with continued beva, on 02/21/22. He tolerates well overall.  - restaging CT scan on April 22 and June 26 showed stable disease overall. -He is clinically doing well, diarrhea is well-controlled, no other significant side effect from treatment.   -Will continue current chemotherapy.  We previously discussed maintenance irinotecan alone, since he is tolerating FOLFIRI well, will continue for now.

## 2023-10-13 ENCOUNTER — Inpatient Hospital Stay: Payer: Medicare Other

## 2023-10-13 VITALS — BP 133/73 | HR 67 | Temp 98.4°F | Resp 16

## 2023-10-13 DIAGNOSIS — Z5112 Encounter for antineoplastic immunotherapy: Secondary | ICD-10-CM | POA: Diagnosis not present

## 2023-10-13 DIAGNOSIS — C20 Malignant neoplasm of rectum: Secondary | ICD-10-CM

## 2023-10-13 MED ORDER — HEPARIN SOD (PORK) LOCK FLUSH 100 UNIT/ML IV SOLN
500.0000 [IU] | Freq: Once | INTRAVENOUS | Status: AC | PRN
  Administered 2023-10-13: 500 [IU]

## 2023-10-13 MED ORDER — SODIUM CHLORIDE 0.9% FLUSH
10.0000 mL | INTRAVENOUS | Status: DC | PRN
  Administered 2023-10-13: 10 mL

## 2023-10-13 MED ORDER — PEGFILGRASTIM-JMDB 6 MG/0.6ML ~~LOC~~ SOSY
6.0000 mg | PREFILLED_SYRINGE | Freq: Once | SUBCUTANEOUS | Status: AC
  Administered 2023-10-13: 6 mg via SUBCUTANEOUS
  Filled 2023-10-13: qty 0.6

## 2023-10-19 NOTE — Progress Notes (Unsigned)
Palliative Medicine [facility]  Telephone:(336) [phone] Fax:(336) [phone]   Name: Jorge [name] Date: 10/19/2023 MRN: [id number]  DOB: 27-Jul-1951  Patient Care Team: Pcp, No as PCP - General Pickenpack-Cousar, Arty Baumgartner, NP as Nurse Practitioner (Nurse Practitioner) Malachy Mood, MD as Consulting Physician (Hematology)   INTERVAL HISTORY: Jorge [name] is a 72 y.o. 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  Palliative ask to see for symptom management and goals of care.   SOCIAL HISTORY:     reports that he has been smoking cigarettes. He has a 5 pack-year smoking history. He has never used smokeless tobacco. He reports that he does not currently use alcohol. He reports that he does not use drugs.  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  MEDICATIONS:  Current Outpatient Medications  Medication Sig Dispense Refill   amLODipine (NORVASC) 10 MG tablet Take 1 tablet (10 mg total) by mouth daily. 90 tablet 1   diphenoxylate-atropine (LOMOTIL) 2.5-0.025 MG tablet Take 1-2 tablets by mouth 4 (four) times daily as needed for diarrhea or loose stools. 120 tablet 2   gabapentin (NEURONTIN) 100 MG capsule Take 2 capsules (200 mg total) by mouth 3 (three) times daily. 180 capsule 2   Multiple Vitamin (MULTIVITAMIN WITH MINERALS) TABS tablet Take 1 tablet by mouth daily.     ondansetron (ZOFRAN) 8 MG tablet Take 1 tablet (8 mg total) by mouth every 8 (eight) hours as needed for nausea or vomiting. 30 tablet 3   oxyCODONE (OXY IR/ROXICODONE) 5 MG immediate release tablet Take 1 tablet (5 mg total) by mouth every 8 (eight) hours as needed for severe pain (pain score 7-10) or breakthrough pain. 45 tablet 0    prochlorperazine (COMPAZINE) 10 MG tablet Take 1 tablet (10 mg total) by mouth every 6 (six) hours as needed (Nausea or vomiting). 30 tablet 1   No current facility-administered medications for this visit.    VITAL SIGNS: There were no vitals taken for this visit. There were no vitals filed for this visit.   Estimated body mass index is 19.86 kg/m as calculated from the following:   Height as of 09/27/23: 6' (1.829 m).   Weight as of 10/11/23: 146 lb 6.4 oz (66.4 kg).   PERFORMANCE STATUS (ECOG) : 1 - Symptomatic but completely ambulatory  Assessment NAD, ambulatory RRR Normal breathing pattern  AAO x3, mood appropriate    IMPRESSION: [name] presents to clinic for follow-up. No acute distress. He is feeling well overall. Denies nausea, vomiting, constipation. Appetite is good. Continues to remain as active as possible. Shares that he is looking forward to spending time with family for the upcoming Christmas holidays.   Neuropathic Pain  [name] reports his pain is well controlled on current regimen. Some days continue to be better than others. Tolerating gabapentin and oxycodone with noticeable improvement to his to neuropathic pain.  He states his pain is improving and seems to be dormant at longer lengths. He is much appreciative of this.   No changes to current regimen.   Will continue to closely monitor and support as needed.   PLAN:  Gabapentin 300mg  three times daily.   Oxy IR 5mg  every 8 hours as  needed. Not requiring around the clock.  I will plan to see patient back in 3-4 weeks in collaboration with his oncology appointments.   Patient expressed understanding and was in agreement with this plan. He also understands that He can call the clinic at any time with any questions, concerns, or complaints.     Any controlled substances utilized were prescribed in the context of palliative care. PDMP has been reviewed.    Visit consisted of counseling and  education dealing with the complex and emotionally intense issues of symptom management and palliative care in the setting of serious and potentially life-threatening illness.  Willette Alma, AGPCNP-BC  Palliative Medicine Team/[facility]

## 2023-10-22 ENCOUNTER — Other Ambulatory Visit: Payer: Self-pay | Admitting: Nurse Practitioner

## 2023-10-22 DIAGNOSIS — C7800 Secondary malignant neoplasm of unspecified lung: Secondary | ICD-10-CM

## 2023-10-22 DIAGNOSIS — Z515 Encounter for palliative care: Secondary | ICD-10-CM

## 2023-10-22 DIAGNOSIS — G893 Neoplasm related pain (acute) (chronic): Secondary | ICD-10-CM

## 2023-10-22 MED ORDER — OXYCODONE HCL 5 MG PO TABS: 5.0000 mg | ORAL_TABLET | Freq: Three times a day (TID) | ORAL | 0 refills | Status: DC | PRN

## 2023-10-23 ENCOUNTER — Encounter: Payer: Self-pay | Admitting: Hematology

## 2023-10-24 NOTE — Progress Notes (Unsigned)
Patient Care Team: Pcp, No as PCP - General Pickenpack-Cousar, Arty Baumgartner, NP as Nurse Practitioner (Nurse Practitioner) Malachy Mood, MD as Consulting Physician (Hematology)   CHIEF COMPLAINT: Follow up metastatic rectal cancer   Oncology History Overview Note  Cancer Staging Rectal cancer [facility]) Staging form: Colon and Rectum, AJCC 8th Edition - Clinical stage from 08/29/2021: Jorge [name], cM1 - Signed by Malachy Mood, MD on 08/29/2021    Rectal cancer (HCC)  07/23/2021 Imaging   CT AP  IMPRESSION: 1. Appearance of the rectum likely indicates a rectal mass suspicious for rectal carcinoma. Inflammatory process would be less likely. Direct visualization is suggested. 2. Cholelithiasis without evidence of acute cholecystitis. 3. Aortic atherosclerosis.   08/19/2021 Procedure   Colonoscopy, under Dr. Meridee Score  Impression: - Rectal tenderness, palpable rectal mass and hemorrhoids found on digital rectal exam. - Stool in the entire examined colon - lavaged with still inadequate clearance. - One 35 mm polyp at the hepatic flexure. Biopsied. Tattooed distal in case future endoscopic resection is considered - however, has larger issues with rectal mass currently as below. - Four, 5 to 11 mm polyps in the transverse colon and at the hepatic flexure, removed with a cold snare. Resected and retrieved. - Diverticulosis in the recto-sigmoid colon and in the sigmoid colon. - Rule out malignancy, partially obstructing tumor in the rectum and from 6 to 13 cm proximal to the anus. Biopsied.   08/19/2021 Pathology Results   Diagnosis 1. Hepatic Flexure Biopsy - TUBULAR ADENOMA WITHOUT HIGH-GRADE DYSPLASIA OR MALIGNANCY 2. Transverse Colon Biopsy, and hepatic flexure, polyps (4) - TUBULAR ADENOMA WITHOUT HIGH-GRADE DYSPLASIA OR MALIGNANCY - OTHER FRAGMENTS OF POLYPOID COLONIC MUCOSA WITH NO SPECIFIC HISTOPATHOLOGIC CHANGES - FOOD MATERIAL 3. Rectum, biopsy - ADENOCARCINOMA. SEE NOTE    08/22/2021 Initial Diagnosis   Rectal cancer (HCC)   08/26/2021 Imaging   IMPRESSION: 1. A 2.2 x 1.9 cm right middle lobe pulmonary nodule as well as a total of four right upper lobe pulmonary nodule and masses measuring up to 3.6 cm. Findings concerning for metastatic primary lung cancer versus less likely metastases in a patient with rectal cancer. Additional imaging evaluation or consultation with Pulmonology or Thoracic Surgery recommended. 2. No gross hilar adenopathy, noting limited sensitivity for the detection of hilar adenopathy on this noncontrast study. 3. Cholelithiasis. 4.  Emphysema (ICD10-J43.9). 5. At least left anterior descending coronary artery calcifications.   08/27/2021 Imaging   IMPRESSION: Rectal adenocarcinoma T stage: T4 B   Rectal adenocarcinoma N stage: N2 disease likely associated with early extra mesorectal lymph node involvement.   Distance from tumor to the internal anal sphincter is 1.2 cm.   Also with extramural venous involvement as described.   08/29/2021 Cancer Staging   Staging form: Colon and Rectum, AJCC 8th Edition - Clinical stage from 08/29/2021: Jorge [name], cM1 - Signed by Malachy Mood, MD on 08/29/2021 Stage prefix: Initial diagnosis   01/23/2022 Imaging   CT CAP w contrast IMPRESSION: 1. Mild interval decrease in size of multiple right-sided pulmonary nodules. No new suspicious pulmonary nodule or mass. 2. Interval development of approximately 10 new ill-defined hypoattenuating liver lesions ranging in size from approximately 5-10 mm. Imaging features suspicious for metastatic disease. MRI abdomen with and without contrast may prove helpful to further evaluate. 3. Similar appearance of wall thickening in the rectum with perirectal edema. 4. Cholelithiasis. 5. Prostatomegaly. 6. Aortic Atherosclerosis (ICD10-I70.0).   11/21/2022 Imaging    IMPRESSION: 1. Stable exam. No new or progressive  interval findings. 2. The primary  rectosigmoid lesion described previously is not well seen on the current study. There is presacral and perirectal edema, likely treatment related. 3. No substantial change in appearance of bilateral pulmonary metastases. 4. Stable tiny hypodensities in both hepatic lobes. These were previously characterized as metastatic disease. No new liver lesions on today's exam. 5. Cholelithiasis. 6. 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 Mosetta Putt 10/11/2023 with cycle 30 of FOLFIRI/Bev.  Tolerating treatment well overall.  Neuropathy is "the same, okay".  He had more frequent bowel movements because he is low on Lomotil and not using as much.  Denies diarrhea/nausea/vomiting, pain, fever, chills, cough, chest pain, dyspnea, leg edema, bleeding, or any other new or specific complaints.  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     Past Surgical History:  Procedure Laterality Date   BRONCHIAL BIOPSY  09/27/2021   Procedure: BRONCHIAL BIOPSIES;  Surgeon: Josephine Igo, DO;  Location: [facility] ENDOSCOPY;  Service: Pulmonary;;   BRONCHIAL BRUSHINGS  09/27/2021   Procedure: BRONCHIAL BRUSHINGS;  Surgeon: Josephine Igo, DO;  Location: [facility]  ENDOSCOPY;  Service: Pulmonary;;   BRONCHIAL NEEDLE ASPIRATION BIOPSY  09/27/2021   Procedure: BRONCHIAL NEEDLE ASPIRATION BIOPSIES;  Surgeon: Josephine Igo, DO;  Location: [facility] ENDOSCOPY;  Service: Pulmonary;;   COLONOSCOPY     IR IMAGING GUIDED PORT INSERTION  10/24/2021   left breast surgery Left 1968   VIDEO BRONCHOSCOPY WITH RADIAL ENDOBRONCHIAL ULTRASOUND  09/27/2021   Procedure: RADIAL ENDOBRONCHIAL ULTRASOUND;  Surgeon: Josephine Igo, DO;  Location: [facility] ENDOSCOPY;  Service: Pulmonary;;     Outpatient Encounter Medications as of 10/25/2023  Medication Sig Note   amLODipine (NORVASC) 10 MG tablet Take 1 tablet (10 mg total) by mouth daily.    gabapentin (NEURONTIN) 100 MG capsule Take 2 capsules (200 mg total) by mouth 3 (three) times daily.    Multiple Vitamin (MULTIVITAMIN WITH MINERALS) TABS tablet Take 1 tablet by mouth daily.    ondansetron (ZOFRAN) 8 MG tablet Take 1 tablet (8 mg total) by mouth every 8 (eight) hours as needed for nausea or vomiting.    oxyCODONE (OXY IR/ROXICODONE) 5 MG immediate release tablet Take 1 tablet (5 mg total) by mouth every 8 (eight) hours as needed for severe pain (pain score 7-10) or breakthrough pain.    prochlorperazine (COMPAZINE) 10 MG tablet Take 1 tablet (10 mg total) by mouth every 6 (six) hours as needed (Nausea or vomiting).    [DISCONTINUED] diphenoxylate-atropine (LOMOTIL) 2.5-0.025 MG tablet Take 1-2 tablets by mouth 4 (four) times daily as needed for diarrhea or loose stools.    diphenoxylate-atropine (LOMOTIL) 2.5-0.025 MG tablet Take 1-2 tablets by mouth 4 (four) times daily as  needed for diarrhea or loose stools.    [DISCONTINUED] ondansetron (ZOFRAN) 8 MG tablet Take 1 tablet (8 mg total) by mouth every 8 (eight) hours as needed for nausea or vomiting. 10/24/2021: Not started   No facility-administered encounter medications on file as of 10/25/2023.     Today's Vitals   10/25/23 0826 10/25/23 0827 10/25/23 0832  BP: (!) 154/76  (!) 148/77   Pulse: 65    Resp: 14    Temp: 98.1 F (36.7 C)    TempSrc: Temporal    SpO2: 94%    Weight: 144 lb 3.2 oz (65.4 kg)    PainSc:   0-No pain   Body mass index is 19.56 kg/m.   PHYSICAL EXAM GENERAL:alert, no distress and comfortable SKIN: no rash  EYES: sclera clear NECK: without mass LYMPH:  no palpable cervical or supraclavicular lymphadenopathy  LUNGS: clear with normal breathing effort HEART: regular rate & rhythm, no lower extremity edema ABDOMEN: abdomen soft, non-tender and normal bowel sounds NEURO: alert & oriented x 3 with fluent speech PAC without erythema    CBC    Component Value Date/Time   WBC 7.0 10/25/2023 0807   WBC 20.3 (H) 05/02/2023 1405   RBC 5.01 10/25/2023 0807   HGB 14.3 10/25/2023 0807   HCT 44.5 10/25/2023 0807   PLT 155 10/25/2023 0807   MCV 88.8 10/25/2023 0807   MCH 28.5 10/25/2023 0807   MCHC 32.1 10/25/2023 0807   RDW 17.3 (H) 10/25/2023 0807   LYMPHSABS 1.2 10/25/2023 0807   MONOABS 0.9 10/25/2023 0807   EOSABS 0.3 10/25/2023 0807   BASOSABS 0.1 10/25/2023 0807     CMP     Component Value Date/Time   NA 137 10/25/2023 0807   K 4.0 10/25/2023 0807   CL 103 10/25/2023 0807   CO2 30 10/25/2023 0807   GLUCOSE 85 10/25/2023 0807   BUN 10 10/25/2023 0807   CREATININE 0.83 10/25/2023 0807   CALCIUM 8.9 10/25/2023 0807   PROT 6.3 (L) 10/25/2023 0807   ALBUMIN 3.6 10/25/2023 0807   AST 21 10/25/2023 0807   ALT 16 10/25/2023 0807   ALKPHOS 183 (H) 10/25/2023 0807   BILITOT 0.4 10/25/2023 0807   GFRNONAA >60 10/25/2023 7846     ASSESSMENT & PLAN:Jorge [name] is a 72 y.o. male with    1. Rectal Adenocarcinoma, [phone] with lung metastasis, MMR normal  -Presented with rectal bleeding and frequent bowel movement.  -staging pelvic MRI 08/27/21 showed: staging at T4b; N2 likely associated with early extra mesorectal lymph node involvement; distance from tumor to internal anal sphincter is 1.2 cm; also with  extramural venous involvement. -baseline CEA 571.84 on 09/06/21. -he received concurrent chemoRT with Xeloda 11/1-11/21/22 under the care of Dr. Mitzi Hansen.  He responded clinically, no recurrent rectal bleeding, his pain has improved  -CEA has decreased on treatment -Lung biopsy 09/27/21 confirmed metastatic rectal cancer.  -S/p first-line CapeOx and bevacizumab 10/14/2021 - 01/25/2022  -Due to new liver lesions he switch to second line FOLFIRI and continued bevacizumab starting 02/21/2022 -Restaging CT in 04/26/2023 and 07/27/2023 showed stable disease; 10/09/23 scan showed mild pulmonary progression, slightly enlarged liver lesions but remain tiny, and stable appearance of the primary rectal mass.   -He continues FOLFIRI/Bev a for now but may change to 3rd line Lonsurf/Beva in Jan -Mr. Quigg appears stable, s/p cycle 30 FOLFIRI/Bev, tolerating well with stable neuropathy and GI output. Side effects are well-managed with supportive care at home.  He is able  to recover and function well.  There is no clinical concern for disease progression -Labs reviewed, adequate to proceed with cycle 31 FOLFIRI/Bev today, no dose adjustments -I offered him a chemo break for the holidays, he will let me know if he wants to cancel 12/31   2.  CIPN -Improved with gabapentin, Cymbalta, and oxycodone -managed by palliative care Lowella Bandy, NP -Stable on FOLFIRI         PLAN: -Labs reviewed -Proceed with cycle 31 FOLFIRI/Bev today, same dose, G-CSF on 12/21 -Follow-up with palliative care today as scheduled -Follow-up and next cycle 11/06/23 unless patient wants a treatment break for the holidays - he will let us know    All questions were answered. The patient knows to call the clinic with any problems, questions or concerns. No barriers to learning were detected.   Santiago Glad, NP-C 10/25/2023

## 2023-10-25 ENCOUNTER — Encounter: Payer: Self-pay | Admitting: Nurse Practitioner

## 2023-10-25 ENCOUNTER — Inpatient Hospital Stay: Payer: Medicare Other

## 2023-10-25 ENCOUNTER — Inpatient Hospital Stay: Payer: Medicare Other | Admitting: Nurse Practitioner

## 2023-10-25 ENCOUNTER — Inpatient Hospital Stay (HOSPITAL_BASED_OUTPATIENT_CLINIC_OR_DEPARTMENT_OTHER): Payer: Medicare Other | Admitting: Nurse Practitioner

## 2023-10-25 VITALS — BP 148/77 | HR 65 | Temp 98.1°F | Resp 14 | Wt 144.2 lb

## 2023-10-25 DIAGNOSIS — Z95828 Presence of other vascular implants and grafts: Secondary | ICD-10-CM

## 2023-10-25 DIAGNOSIS — C20 Malignant neoplasm of rectum: Secondary | ICD-10-CM

## 2023-10-25 DIAGNOSIS — M792 Neuralgia and neuritis, unspecified: Secondary | ICD-10-CM

## 2023-10-25 DIAGNOSIS — C7800 Secondary malignant neoplasm of unspecified lung: Secondary | ICD-10-CM

## 2023-10-25 DIAGNOSIS — Z515 Encounter for palliative care: Secondary | ICD-10-CM

## 2023-10-25 DIAGNOSIS — G893 Neoplasm related pain (acute) (chronic): Secondary | ICD-10-CM

## 2023-10-25 DIAGNOSIS — R197 Diarrhea, unspecified: Secondary | ICD-10-CM

## 2023-10-25 DIAGNOSIS — Z5112 Encounter for antineoplastic immunotherapy: Secondary | ICD-10-CM | POA: Diagnosis not present

## 2023-10-25 LAB — CMP (CANCER CENTER ONLY)
ALT: 16 U/L (ref 0–44)
AST: 21 U/L (ref 15–41)
Albumin: 3.6 g/dL (ref 3.5–5.0)
Alkaline Phosphatase: 183 U/L — ABNORMAL HIGH (ref 38–126)
Anion gap: 4 — ABNORMAL LOW (ref 5–15)
BUN: 10 mg/dL (ref 8–23)
CO2: 30 mmol/L (ref 22–32)
Calcium: 8.9 mg/dL (ref 8.9–10.3)
Chloride: 103 mmol/L (ref 98–111)
Creatinine: 0.83 mg/dL (ref 0.61–1.24)
GFR, Estimated: >60 mL/min (ref >60)
Glucose, Bld: 85 mg/dL (ref 70–99)
Potassium: 4 mmol/L (ref 3.5–5.1)
Sodium: 137 mmol/L (ref 135–145)
Total Bilirubin: 0.4 mg/dL (ref <1.2)
Total Protein: 6.3 g/dL — ABNORMAL LOW (ref 6.5–8.1)

## 2023-10-25 LAB — CBC WITH DIFFERENTIAL (CANCER CENTER ONLY)
Abs Immature Granulocytes: 0.04 10*3/uL (ref 0.00–0.07)
Basophils Absolute: 0.1 10*3/uL (ref 0.0–0.1)
Basophils Relative: 1 %
Eosinophils Absolute: 0.3 10*3/uL (ref 0.0–0.5)
Eosinophils Relative: 5 %
HCT: 44.5 % (ref 39.0–52.0)
Hemoglobin: 14.3 g/dL (ref 13.0–17.0)
Immature Granulocytes: 1 %
Lymphocytes Relative: 16 %
Lymphs Abs: 1.2 10*3/uL (ref 0.7–4.0)
MCH: 28.5 pg (ref 26.0–34.0)
MCHC: 32.1 g/dL (ref 30.0–36.0)
MCV: 88.8 fL (ref 80.0–100.0)
Monocytes Absolute: 0.9 10*3/uL (ref 0.1–1.0)
Monocytes Relative: 12 %
Neutro Abs: 4.6 10*3/uL (ref 1.7–7.7)
Neutrophils Relative %: 65 %
Platelet Count: 155 10*3/uL (ref 150–400)
RBC: 5.01 MIL/uL (ref 4.22–5.81)
RDW: 17.3 % — ABNORMAL HIGH (ref 11.5–15.5)
WBC Count: 7 10*3/uL (ref 4.0–10.5)
nRBC: 0 % (ref 0.0–0.2)

## 2023-10-25 LAB — TOTAL PROTEIN, URINE DIPSTICK: Protein, ur: 100 mg/dL — AB

## 2023-10-25 MED ORDER — DEXAMETHASONE SODIUM PHOSPHATE 10 MG/ML IJ SOLN
10.0000 mg | Freq: Once | INTRAMUSCULAR | Status: AC
  Administered 2023-10-25: 10 mg via INTRAVENOUS
  Filled 2023-10-25: qty 1

## 2023-10-25 MED ORDER — SODIUM CHLORIDE 0.9% FLUSH
10.0000 mL | INTRAVENOUS | Status: DC | PRN
Start: 2023-10-25 — End: 2023-10-25

## 2023-10-25 MED ORDER — PALONOSETRON HCL INJECTION 0.25 MG/5ML
0.2500 mg | Freq: Once | INTRAVENOUS | Status: AC
  Administered 2023-10-25: 0.25 mg via INTRAVENOUS
  Filled 2023-10-25: qty 5

## 2023-10-25 MED ORDER — SODIUM CHLORIDE 0.9 % IV SOLN
5.0000 mg/kg | Freq: Once | INTRAVENOUS | Status: AC
  Administered 2023-10-25: 350 mg via INTRAVENOUS
  Filled 2023-10-25: qty 14

## 2023-10-25 MED ORDER — SODIUM CHLORIDE 0.9 % IV SOLN
2400.0000 mg/m2 | INTRAVENOUS | Status: DC
  Administered 2023-10-25: 4450 mg via INTRAVENOUS
  Filled 2023-10-25: qty 89

## 2023-10-25 MED ORDER — LEUCOVORIN CALCIUM INJECTION 350 MG
400.0000 mg/m2 | Freq: Once | INTRAMUSCULAR | Status: AC
  Administered 2023-10-25: 740 mg via INTRAVENOUS
  Filled 2023-10-25: qty 17.5

## 2023-10-25 MED ORDER — IRINOTECAN HCL CHEMO INJECTION 100 MG/5ML
140.0000 mg/m2 | Freq: Once | INTRAVENOUS | Status: AC
  Administered 2023-10-25: 260 mg via INTRAVENOUS
  Filled 2023-10-25: qty 13

## 2023-10-25 MED ORDER — HEPARIN SOD (PORK) LOCK FLUSH 100 UNIT/ML IV SOLN
500.0000 [IU] | Freq: Once | INTRAVENOUS | Status: DC | PRN
Start: 2023-10-25 — End: 2023-10-25

## 2023-10-25 MED ORDER — ATROPINE SULFATE 1 MG/ML IV SOLN
0.4000 mg | Freq: Once | INTRAVENOUS | Status: AC
Start: 2023-10-25 — End: 2023-10-25
  Administered 2023-10-25: 0.4 mg via INTRAVENOUS
  Filled 2023-10-25: qty 1

## 2023-10-25 MED ORDER — DIPHENOXYLATE-ATROPINE 2.5-0.025 MG PO TABS: 1.0000 | ORAL_TABLET | Freq: Four times a day (QID) | ORAL | 2 refills | Status: DC | PRN

## 2023-10-25 MED ORDER — SODIUM CHLORIDE 0.9% FLUSH
10.0000 mL | Freq: Once | INTRAVENOUS | Status: AC
  Administered 2023-10-25: 10 mL

## 2023-10-25 MED ORDER — SODIUM CHLORIDE 0.9 % IV SOLN: Freq: Once | INTRAVENOUS | Status: AC

## 2023-10-25 NOTE — Patient Instructions (Signed)
[facility] - A DEPT OF [facility]  Discharge Instructions: Thank you for choosing [facility] to provide your oncology and hematology care.   If you have a lab appointment with the Cancer Center, please go directly to the Cancer Center and check in at the registration area.   Wear comfortable clothing and clothing appropriate for easy access to any Portacath or PICC line.   We strive to give you quality time with your provider. You may need to reschedule your appointment if you arrive late (15 or more minutes).  Arriving late affects you and other patients whose appointments are after yours.  Also, if you miss three or more appointments without notifying the office, you may be dismissed from the clinic at the provider's discretion.      For prescription refill requests, have your pharmacy contact our office and allow 72 hours for refills to be completed.    Today you received the following chemotherapy and/or immunotherapy agents: Bevacizumab, Irinotecan, Leucovorin, and  Fluorouracil.   To help prevent nausea and vomiting after your treatment, we encourage you to take your nausea medication as directed.  BELOW ARE SYMPTOMS THAT SHOULD BE REPORTED IMMEDIATELY: *FEVER GREATER THAN 100.4 F (38 C) OR HIGHER *CHILLS OR SWEATING *NAUSEA AND VOMITING THAT IS NOT CONTROLLED WITH YOUR NAUSEA MEDICATION *UNUSUAL SHORTNESS OF BREATH *UNUSUAL BRUISING OR BLEEDING *URINARY PROBLEMS (pain or burning when urinating, or frequent urination) *BOWEL PROBLEMS (unusual diarrhea, constipation, pain near the anus) TENDERNESS IN MOUTH AND THROAT WITH OR WITHOUT PRESENCE OF ULCERS (sore throat, sores in mouth, or a toothache) UNUSUAL RASH, SWELLING OR PAIN  UNUSUAL VAGINAL DISCHARGE OR ITCHING   Items with * indicate a potential emergency and should be followed up as soon as possible or go to the Emergency Department if any problems should occur.  Please show  the CHEMOTHERAPY ALERT CARD or IMMUNOTHERAPY ALERT CARD at check-in to the Emergency Department and triage nurse.  Should you have questions after your visit or need to cancel or reschedule your appointment, please contact [facility] - A DEPT OF [facility]  Dept: [phone]  and follow the prompts.  Office hours are 8:00 a.m. to 4:30 p.m. Monday - Friday. Please note that voicemails left after 4:00 p.m. may not be returned until the following business day.  We are closed weekends and major holidays. You have access to a nurse at all times for urgent questions. Please call the main number to the clinic Dept: [phone] and follow the prompts.   For any non-urgent questions, you may also contact your provider using MyChart. We now offer e-Visits for anyone 98 and older to request care online for non-urgent symptoms. For details visit [email or web address].   Also download the MyChart app! Go to the app store, search "MyChart", open the app, select [place], and log in with your MyChart username and password.

## 2023-10-27 ENCOUNTER — Inpatient Hospital Stay: Payer: Medicare Other

## 2023-10-27 VITALS — BP 159/85 | HR 79 | Temp 99.0°F | Resp 19

## 2023-10-27 DIAGNOSIS — C20 Malignant neoplasm of rectum: Secondary | ICD-10-CM

## 2023-10-27 DIAGNOSIS — Z5112 Encounter for antineoplastic immunotherapy: Secondary | ICD-10-CM | POA: Diagnosis not present

## 2023-10-27 MED ORDER — HEPARIN SOD (PORK) LOCK FLUSH 100 UNIT/ML IV SOLN
500.0000 [IU] | Freq: Once | INTRAVENOUS | Status: AC | PRN
  Administered 2023-10-27: 500 [IU]

## 2023-10-27 MED ORDER — PEGFILGRASTIM-JMDB 6 MG/0.6ML ~~LOC~~ SOSY
6.0000 mg | PREFILLED_SYRINGE | Freq: Once | SUBCUTANEOUS | Status: AC
  Administered 2023-10-27: 6 mg via SUBCUTANEOUS

## 2023-10-27 MED ORDER — SODIUM CHLORIDE 0.9% FLUSH
10.0000 mL | INTRAVENOUS | Status: DC | PRN
  Administered 2023-10-27: 10 mL

## 2023-11-05 ENCOUNTER — Other Ambulatory Visit: Payer: Self-pay | Admitting: Nurse Practitioner

## 2023-11-05 DIAGNOSIS — Z515 Encounter for palliative care: Secondary | ICD-10-CM

## 2023-11-05 DIAGNOSIS — G893 Neoplasm related pain (acute) (chronic): Secondary | ICD-10-CM

## 2023-11-05 DIAGNOSIS — C20 Malignant neoplasm of rectum: Secondary | ICD-10-CM

## 2023-11-05 MED ORDER — OXYCODONE HCL 5 MG PO TABS: 5.0000 mg | ORAL_TABLET | Freq: Three times a day (TID) | ORAL | 0 refills | Status: DC | PRN

## 2023-11-06 ENCOUNTER — Encounter: Payer: Self-pay | Admitting: Hematology

## 2023-11-06 ENCOUNTER — Ambulatory Visit: Payer: Medicare Other

## 2023-11-06 ENCOUNTER — Other Ambulatory Visit: Payer: Medicare Other

## 2023-11-06 ENCOUNTER — Ambulatory Visit: Payer: Medicare Other | Admitting: Nurse Practitioner

## 2023-11-08 LAB — GUARDANT 360

## 2023-11-12 NOTE — Assessment & Plan Note (Signed)
 [id number] with lung metastasis, MMR normal, KRAS(+) [id number]  -diagnosed 08/2021 by colonoscopy for rectal bleeding and frequent BM. Staging MRI showed early extra mesorectal lymph node involvement.  -baseline CEA 571.84 on 09/06/21.  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 Xeloda  dose reduced due to elevated liver enzymes.  -due to new liver lesions, we switched to second-line FOLFIRI, with continued beva, on 02/21/22. He tolerates well overall.  - restaging CT scan on April 22 and June 26 showed stable disease overall. -He is clinically doing well, diarrhea is well-controlled, no other significant side effect from treatment.   -CT 10/09/2023 showed mild disease progression in lungs, his tumor marker CEA has been going up also.

## 2023-11-13 NOTE — Progress Notes (Signed)
 Palliative Medicine [facility]  [phone] Fax:([phone]) [phone]   Name: Jorge [name] Date: 11/13/2023 MRN: [id number]  DOB: January 01, 1951  Patient Care Team: Pcp, No as PCP - General Pickenpack-Cousar, Fannie SAILOR, NP as Nurse Practitioner (Nurse Practitioner) Lanny Callander, MD as Consulting Physician (Hematology)   INTERVAL HISTORY: Jorge [name] is a 73 y.o. 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  Palliative ask to see for symptom management and goals of care.   SOCIAL HISTORY:     reports that he has been smoking cigarettes. He has a 5 pack-year smoking history. He has never used smokeless tobacco. He reports that he does not currently use alcohol. He reports that he does not use drugs.  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  MEDICATIONS:  Current Outpatient Medications  Medication Sig Dispense Refill   amLODipine  (NORVASC ) 10 MG tablet Take 1 tablet (10 mg total) by mouth daily. 90 tablet 1   diphenoxylate -atropine  (LOMOTIL ) 2.5-0.025 MG tablet Take 1-2 tablets by mouth 4 (four) times daily as needed for diarrhea or loose stools. 120 tablet 2   gabapentin  (NEURONTIN ) 100 MG capsule Take 2 capsules (200 mg total) by mouth 3 (three) times daily. 180 capsule 2   Multiple Vitamin (MULTIVITAMIN WITH MINERALS) TABS tablet Take 1 tablet by mouth daily.     ondansetron  (ZOFRAN ) 8 MG tablet Take 1 tablet (8 mg total) by mouth every 8 (eight) hours as needed for nausea or vomiting. 30 tablet 3   oxyCODONE  (OXY IR/ROXICODONE ) 5 MG immediate release tablet Take 1 tablet (5 mg total) by mouth every 8 (eight) hours as needed for severe pain (pain score 7-10) or breakthrough pain. 45 tablet 0    prochlorperazine  (COMPAZINE ) 10 MG tablet Take 1 tablet (10 mg total) by mouth every 6 (six) hours as needed (Nausea or vomiting). 30 tablet 1   No current facility-administered medications for this visit.    VITAL SIGNS: There were no vitals taken for this visit. There were no vitals filed for this visit.   Estimated body mass index is 19.56 kg/m as calculated from the following:   Height as of 09/27/23: 6' (1.829 m).   Weight as of 10/25/23: 144 lb 3.2 oz (65.4 kg).   PERFORMANCE STATUS (ECOG) : 1 - Symptomatic but completely ambulatory  Assessment NAD, ambulatory RRR Normal breathing pattern  AAO x3, mood appropriate    IMPRESSION:  I saw Jorge [name] during his infusion. No acute distress. Shares he had an enjoyable holiday with his family. He is expecting 2 new great-grandchildren in the upcoming year. Shares will bring his great-grandchildren total up to 9 while he has 29 grandchildren. Continues to remain as active as possible. He did not report any new symptoms or complaints during the visit. Loose stools managed with Lomotil  as needed.   Jorge [name] reports his pain is well controlled on current regimen. Neuropathy is manageable allowing him to be active. Tolerating regimen without difficulty. Will continue with as needed medication and gabapentin  as prescribed.   All questions answered and support provided.  PLAN:  Gabapentin  300mg  three times daily.   Oxy IR 5mg  every 8 hours as needed. Not requiring around the clock.  I  will plan to see patient back in 3-4 weeks in collaboration with his oncology appointments.   Patient expressed understanding and was in agreement with this plan. He also understands that He can call the clinic at any time with any questions, concerns, or complaints.     Any controlled substances utilized were prescribed in the context of palliative care. PDMP has been reviewed.    Visit consisted of counseling and education dealing with the  complex and emotionally intense issues of symptom management and palliative care in the setting of serious and potentially life-threatening illness.  Levon Borer, AGPCNP-BC  Palliative Medicine Team/[facility]

## 2023-11-14 ENCOUNTER — Inpatient Hospital Stay: Payer: Medicare Other | Attending: Hematology

## 2023-11-14 ENCOUNTER — Other Ambulatory Visit: Payer: Self-pay | Admitting: *Deleted

## 2023-11-14 ENCOUNTER — Inpatient Hospital Stay: Payer: Medicare Other

## 2023-11-14 ENCOUNTER — Encounter: Payer: Self-pay | Admitting: Hematology

## 2023-11-14 ENCOUNTER — Inpatient Hospital Stay (HOSPITAL_BASED_OUTPATIENT_CLINIC_OR_DEPARTMENT_OTHER): Payer: Medicare Other | Admitting: Nurse Practitioner

## 2023-11-14 ENCOUNTER — Encounter: Payer: Self-pay | Admitting: Nurse Practitioner

## 2023-11-14 ENCOUNTER — Inpatient Hospital Stay (HOSPITAL_BASED_OUTPATIENT_CLINIC_OR_DEPARTMENT_OTHER): Payer: Medicare Other | Admitting: Hematology

## 2023-11-14 VITALS — BP 148/83 | HR 72 | Temp 97.5°F | Resp 18 | Wt 140.1 lb

## 2023-11-14 DIAGNOSIS — Z5189 Encounter for other specified aftercare: Secondary | ICD-10-CM | POA: Insufficient documentation

## 2023-11-14 DIAGNOSIS — F17210 Nicotine dependence, cigarettes, uncomplicated: Secondary | ICD-10-CM | POA: Diagnosis not present

## 2023-11-14 DIAGNOSIS — Z5112 Encounter for antineoplastic immunotherapy: Secondary | ICD-10-CM | POA: Diagnosis present

## 2023-11-14 DIAGNOSIS — C787 Secondary malignant neoplasm of liver and intrahepatic bile duct: Secondary | ICD-10-CM | POA: Insufficient documentation

## 2023-11-14 DIAGNOSIS — C7800 Secondary malignant neoplasm of unspecified lung: Secondary | ICD-10-CM

## 2023-11-14 DIAGNOSIS — C20 Malignant neoplasm of rectum: Secondary | ICD-10-CM

## 2023-11-14 DIAGNOSIS — G893 Neoplasm related pain (acute) (chronic): Secondary | ICD-10-CM

## 2023-11-14 DIAGNOSIS — C775 Secondary and unspecified malignant neoplasm of intrapelvic lymph nodes: Secondary | ICD-10-CM | POA: Insufficient documentation

## 2023-11-14 DIAGNOSIS — Z95828 Presence of other vascular implants and grafts: Secondary | ICD-10-CM

## 2023-11-14 DIAGNOSIS — M792 Neuralgia and neuritis, unspecified: Secondary | ICD-10-CM

## 2023-11-14 DIAGNOSIS — Z515 Encounter for palliative care: Secondary | ICD-10-CM

## 2023-11-14 LAB — CMP (CANCER CENTER ONLY)
ALT: 18 U/L (ref 0–44)
AST: 23 U/L (ref 15–41)
Albumin: 3.6 g/dL (ref 3.5–5.0)
Alkaline Phosphatase: 147 U/L — ABNORMAL HIGH (ref 38–126)
Anion gap: 3 — ABNORMAL LOW (ref 5–15)
BUN: 13 mg/dL (ref 8–23)
CO2: 31 mmol/L (ref 22–32)
Calcium: 9.1 mg/dL (ref 8.9–10.3)
Chloride: 105 mmol/L (ref 98–111)
Creatinine: 0.73 mg/dL (ref 0.61–1.24)
GFR, Estimated: >60 mL/min (ref >60)
Glucose, Bld: 92 mg/dL (ref 70–99)
Potassium: 4.4 mmol/L (ref 3.5–5.1)
Sodium: 139 mmol/L (ref 135–145)
Total Bilirubin: 0.4 mg/dL (ref 0.0–1.2)
Total Protein: 7 g/dL (ref 6.5–8.1)

## 2023-11-14 LAB — CBC WITH DIFFERENTIAL (CANCER CENTER ONLY)
Abs Immature Granulocytes: 0.01 10*3/uL (ref 0.00–0.07)
Basophils Absolute: 0 10*3/uL (ref 0.0–0.1)
Basophils Relative: 1 %
Eosinophils Absolute: 0.2 10*3/uL (ref 0.0–0.5)
Eosinophils Relative: 4 %
HCT: 42.9 % (ref 39.0–52.0)
Hemoglobin: 14 g/dL (ref 13.0–17.0)
Immature Granulocytes: 0 %
Lymphocytes Relative: 23 %
Lymphs Abs: 1 10*3/uL (ref 0.7–4.0)
MCH: 28.7 pg (ref 26.0–34.0)
MCHC: 32.6 g/dL (ref 30.0–36.0)
MCV: 88.1 fL (ref 80.0–100.0)
Monocytes Absolute: 0.7 10*3/uL (ref 0.1–1.0)
Monocytes Relative: 14 %
Neutro Abs: 2.6 10*3/uL (ref 1.7–7.7)
Neutrophils Relative %: 58 %
Platelet Count: 199 10*3/uL (ref 150–400)
RBC: 4.87 MIL/uL (ref 4.22–5.81)
RDW: 16.4 % — ABNORMAL HIGH (ref 11.5–15.5)
WBC Count: 4.6 10*3/uL (ref 4.0–10.5)
nRBC: 0 % (ref 0.0–0.2)

## 2023-11-14 LAB — TOTAL PROTEIN, URINE DIPSTICK: Protein, ur: 100 mg/dL — AB

## 2023-11-14 LAB — CEA (ACCESS): CEA (CHCC): 74.58 ng/mL — ABNORMAL HIGH (ref 0.00–5.00)

## 2023-11-14 MED ORDER — LONSURF 20-8.19 MG PO TABS
35.0000 mg/m2 | ORAL_TABLET | Freq: Two times a day (BID) | ORAL | 0 refills | Status: DC
  Filled 2023-12-19: qty 60, 10d supply, fill #0

## 2023-11-14 MED ORDER — PROCHLORPERAZINE MALEATE 10 MG PO TABS: 10.0000 mg | ORAL_TABLET | Freq: Four times a day (QID) | ORAL | 1 refills | Status: DC | PRN

## 2023-11-14 MED ORDER — FLUOROURACIL CHEMO INJECTION 5 GM/100ML
2400.0000 mg/m2 | INTRAVENOUS | Status: DC
  Administered 2023-11-14: 4450 mg via INTRAVENOUS
  Filled 2023-11-14: qty 89

## 2023-11-14 MED ORDER — BEVACIZUMAB-ADCD CHEMO INJECTION 400 MG/16ML
5.0000 mg/kg | Freq: Once | INTRAVENOUS | Status: AC
  Administered 2023-11-14: 350 mg via INTRAVENOUS
  Filled 2023-11-14: qty 14

## 2023-11-14 MED ORDER — SODIUM CHLORIDE 0.9 % IV SOLN: Freq: Once | INTRAVENOUS | Status: AC

## 2023-11-14 MED ORDER — GABAPENTIN 100 MG PO CAPS: 200.0000 mg | ORAL_CAPSULE | Freq: Three times a day (TID) | ORAL | 2 refills | Status: DC

## 2023-11-14 MED ORDER — PALONOSETRON HCL INJECTION 0.25 MG/5ML
0.2500 mg | Freq: Once | INTRAVENOUS | Status: AC
  Administered 2023-11-14: 0.25 mg via INTRAVENOUS
  Filled 2023-11-14: qty 5

## 2023-11-14 MED ORDER — AMLODIPINE BESYLATE 10 MG PO TABS: 10.0000 mg | ORAL_TABLET | Freq: Every day | ORAL | 1 refills | Status: DC

## 2023-11-14 MED ORDER — DEXAMETHASONE SODIUM PHOSPHATE 10 MG/ML IJ SOLN
10.0000 mg | Freq: Once | INTRAMUSCULAR | Status: AC
  Administered 2023-11-14: 10 mg via INTRAVENOUS
  Filled 2023-11-14: qty 1

## 2023-11-14 MED ORDER — SODIUM CHLORIDE 0.9% FLUSH
10.0000 mL | Freq: Once | INTRAVENOUS | Status: AC
  Administered 2023-11-14: 10 mL

## 2023-11-14 MED ORDER — OXYCODONE HCL 5 MG PO TABS: 5.0000 mg | ORAL_TABLET | Freq: Three times a day (TID) | ORAL | 0 refills | Status: DC | PRN

## 2023-11-14 MED ORDER — SODIUM CHLORIDE 0.9 % IV SOLN
140.0000 mg/m2 | Freq: Once | INTRAVENOUS | Status: AC
Start: 2023-11-14 — End: 2023-11-14
  Administered 2023-11-14: 260 mg via INTRAVENOUS
  Filled 2023-11-14: qty 13

## 2023-11-14 MED ORDER — HEPARIN SOD (PORK) LOCK FLUSH 100 UNIT/ML IV SOLN
500.0000 [IU] | Freq: Once | INTRAVENOUS | Status: DC | PRN
Start: 2023-11-14 — End: 2023-11-14

## 2023-11-14 MED ORDER — SODIUM CHLORIDE 0.9 % IV SOLN
400.0000 mg/m2 | Freq: Once | INTRAVENOUS | Status: AC
  Administered 2023-11-14: 740 mg via INTRAVENOUS
  Filled 2023-11-14: qty 25

## 2023-11-14 MED ORDER — ATROPINE SULFATE 1 MG/ML IV SOLN
0.4000 mg | Freq: Once | INTRAVENOUS | Status: AC
Start: 2023-11-14 — End: 2023-11-14
  Administered 2023-11-14: 0.4 mg via INTRAVENOUS
  Filled 2023-11-14: qty 1

## 2023-11-14 MED ORDER — SODIUM CHLORIDE 0.9% FLUSH: 10.0000 mL | INTRAVENOUS | Status: DC | PRN

## 2023-11-14 NOTE — Patient Instructions (Signed)
 [facility] - A DEPT OF [facility]  Discharge Instructions: Thank you for choosing [facility] to provide your oncology and hematology care.   If you have a lab appointment with the Cancer Center, please go directly to the Cancer Center and check in at the registration area.   Wear comfortable clothing and clothing appropriate for easy access to any Portacath or PICC line.   We strive to give you quality time with your provider. You may need to reschedule your appointment if you arrive late (15 or more minutes).  Arriving late affects you and other patients whose appointments are after yours.  Also, if you miss three or more appointments without notifying the office, you may be dismissed from the clinic at the provider's discretion.      For prescription refill requests, have your pharmacy contact our office and allow 72 hours for refills to be completed.    Today you received the following chemotherapy and/or immunotherapy agents bevacizumab , irinotecan , leucovorin , fluorourcil      To help prevent nausea and vomiting after your treatment, we encourage you to take your nausea medication as directed.  BELOW ARE SYMPTOMS THAT SHOULD BE REPORTED IMMEDIATELY: *FEVER GREATER THAN 100.4 F (38 C) OR HIGHER *CHILLS OR SWEATING *NAUSEA AND VOMITING THAT IS NOT CONTROLLED WITH YOUR NAUSEA MEDICATION *UNUSUAL SHORTNESS OF BREATH *UNUSUAL BRUISING OR BLEEDING *URINARY PROBLEMS (pain or burning when urinating, or frequent urination) *BOWEL PROBLEMS (unusual diarrhea, constipation, pain near the anus) TENDERNESS IN MOUTH AND THROAT WITH OR WITHOUT PRESENCE OF ULCERS (sore throat, sores in mouth, or a toothache) UNUSUAL RASH, SWELLING OR PAIN  UNUSUAL VAGINAL DISCHARGE OR ITCHING   Items with * indicate a potential emergency and should be followed up as soon as possible or go to the Emergency Department if any problems should occur.  Please show the  CHEMOTHERAPY ALERT CARD or IMMUNOTHERAPY ALERT CARD at check-in to the Emergency Department and triage nurse.  Should you have questions after your visit or need to cancel or reschedule your appointment, please contact [facility] - A DEPT OF [facility]  Dept: [phone]  and follow the prompts.  Office hours are 8:00 a.m. to 4:30 p.m. Monday - Friday. Please note that voicemails left after 4:00 p.m. may not be returned until the following business day.  We are closed weekends and major holidays. You have access to a nurse at all times for urgent questions. Please call the main number to the clinic Dept: [phone] and follow the prompts.   For any non-urgent questions, you may also contact your provider using MyChart. We now offer e-Visits for anyone 73 and older to request care online for non-urgent symptoms. For details visit [email or web address].   Also download the MyChart app! Go to the app store, search "MyChart", open the app, select [place], and log in with your MyChart username and password.

## 2023-11-14 NOTE — Progress Notes (Signed)
 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 Staging MRI showed early extra mesorectal lymph node involvement.  -baseline CEA 571.84 on 09/06/21.  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 Xeloda  dose reduced due to elevated liver enzymes.  -due to new liver lesions, we switched to second-line FOLFIRI, with continued beva, on 02/21/22. He tolerates well overall.  - restaging CT scan on April 22 and June 26 showed stable disease overall. -He is clinically doing well, diarrhea is well-controlled, no other significant side effect from treatment.   -CT 10/09/2023 showed mild disease progression in lungs, his tumor marker CEA has been going up also.    Assessment and Plan    Metastatic Colon Cancer Metastatic colon cancer with increasing CEA from 17 to 92 and slight enlargement on CT scan from December 3rd. Current regimen is less effective with developing resistance. Discussed transitioning to third-line therapy with oral chemotherapy Lonsurf  and bevacizumab  infusion every two weeks, which may benefit about one-third of patients. Explained that the new drug is different, not stronger, and the previous regimen may be revisited. - Continue current treatment for today - Start new treatment regimen with oral chemotherapy and bevacizumab  infusion  on January 27th - Cancel future appointments for the current regimen  Diarrhea Effective symptom control with Lomotil , but has run out. - Refill Lomotil   Nausea and Vomiting Effective symptom control with Compazine  and ondansetron , but has run out. Compazine  causes drowsiness. - Refill Compazine  - Refill ondansetron   Pain Management Effective pain control with oxycodone , but has run out. Levon will handle the refill. - Nikki to refill oxycodone   Neuropathic Pain Effective symptom control with gabapentin , but has run out. - Refill gabapentin   Hypertension Blood pressure elevated at 152/93. Currently on amlodipine , which has run out. - Refill amlodipine  - Advise to contact primary care physician for further hypertension management  Plan -Lab reviewed, adequate for treatment, will proceed to chemotherapy with FOLFIRI and bevacizumab  today -Plan to change third line chemotherapy Lonsurf  and bevacizumab , starting on January 27 - Schedule next visit for January 27th - Ensure understanding of new treatment regimen and schedule.        SUMMARY OF ONCOLOGIC HISTORY: Oncology History Overview Note  Cancer Staging Rectal cancer [facility]) Staging form: Colon and Rectum, AJCC 8th Edition - Clinical stage from 08/29/2021: [name], cM1 - Signed by Lanny Callander, MD on 08/29/2021    Rectal cancer (HCC)  07/23/2021 Imaging   CT AP  IMPRESSION: 1. Appearance of the rectum likely indicates a rectal mass suspicious for rectal carcinoma. Inflammatory process would be less likely. Direct visualization is suggested. 2. Cholelithiasis without evidence of acute cholecystitis. 3. Aortic atherosclerosis.   08/19/2021 Procedure   Colonoscopy, under Dr. Wilhelmenia  Impression: - Rectal tenderness, palpable rectal mass and hemorrhoids found on digital rectal exam. - Stool in the entire examined colon - lavaged with still inadequate clearance. - One 35 mm  polyp at the hepatic flexure. Biopsied.  Tattooed distal in case future endoscopic resection is considered - however, has larger issues with rectal mass currently as below. - Four, 5 to 11 mm polyps in the transverse colon and at the hepatic flexure, removed with a cold snare. Resected and retrieved. - Diverticulosis in the recto-sigmoid colon and in the sigmoid colon. - Rule out malignancy, partially obstructing tumor in the rectum and from 6 to 13 cm proximal to the anus. Biopsied.   08/19/2021 Pathology Results   Diagnosis 1. Hepatic Flexure Biopsy - TUBULAR ADENOMA WITHOUT HIGH-GRADE DYSPLASIA OR MALIGNANCY 2. Transverse Colon Biopsy, and hepatic flexure, polyps (4) - TUBULAR ADENOMA WITHOUT HIGH-GRADE DYSPLASIA OR MALIGNANCY - OTHER FRAGMENTS OF POLYPOID COLONIC MUCOSA WITH NO SPECIFIC HISTOPATHOLOGIC CHANGES - FOOD MATERIAL 3. Rectum, biopsy - ADENOCARCINOMA. SEE NOTE   08/22/2021 Initial Diagnosis   Rectal cancer (HCC)   08/26/2021 Imaging   IMPRESSION: 1. A 2.2 x 1.9 cm right middle lobe pulmonary nodule as well as a total of four right upper lobe pulmonary nodule and masses measuring up to 3.6 cm. Findings concerning for metastatic primary lung cancer versus less likely metastases in a patient with rectal cancer. Additional imaging evaluation or consultation with Pulmonology or Thoracic Surgery recommended. 2. No gross hilar adenopathy, noting limited sensitivity for the detection of hilar adenopathy on this noncontrast study. 3. Cholelithiasis. 4.  Emphysema (ICD10-J43.9). 5. At least left anterior descending coronary artery calcifications.   08/27/2021 Imaging   IMPRESSION: Rectal adenocarcinoma T stage: T4 B   Rectal adenocarcinoma N stage: N2 disease likely associated with early extra mesorectal lymph node involvement.   Distance from tumor to the internal anal sphincter is 1.2 cm.   Also with extramural venous involvement as described.   08/29/2021 Cancer Staging   Staging form: Colon and Rectum, AJCC  8th Edition - Clinical stage from 08/29/2021: [name], cM1 - Signed by Lanny Callander, MD on 08/29/2021 Stage prefix: Initial diagnosis   01/23/2022 Imaging   CT CAP w contrast IMPRESSION: 1. Mild interval decrease in size of multiple right-sided pulmonary nodules. No new suspicious pulmonary nodule or mass. 2. Interval development of approximately 10 new ill-defined hypoattenuating liver lesions ranging in size from approximately 5-10 mm. Imaging features suspicious for metastatic disease. MRI abdomen with and without contrast may prove helpful to further evaluate. 3. Similar appearance of wall thickening in the rectum with perirectal edema. 4. Cholelithiasis. 5. Prostatomegaly. 6. Aortic Atherosclerosis (ICD10-I70.0).   11/21/2022 Imaging    IMPRESSION: 1. Stable exam. No new or progressive interval findings. 2. The primary rectosigmoid lesion described previously is not well seen on the current study. There is presacral and perirectal edema, likely treatment related. 3. No substantial change in appearance of bilateral pulmonary metastases. 4. Stable tiny hypodensities in both hepatic lobes. These were previously characterized as metastatic disease. No new liver lesions on today's exam. 5. Cholelithiasis. 6. 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  History of Present Illness    A 73 year old patient with a known diagnosis of metastatic colon cancer presents for a follow-up visit. The patient reports no new symptoms but discusses the need for refills on several medications including Lomotil , amlodipine , gabapentin , oxycodone , and anti-nausea medications (Compazine  and Ondansetron ). The patient also mentions a high blood pressure reading of 152/93. The patient's cancer has been slowly progressing over time, and there is a discussion about changing the treatment regimen. The patient's tumor marker, CEA, has increased from 17 to 92. The patient's CT scan from December shows slight enlargement and some dyslexia.         All other systems were reviewed with the patient and are negative.  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    SURGICAL HISTORY: Past Surgical History:  Procedure Laterality Date   BRONCHIAL BIOPSY  09/27/2021   Procedure: BRONCHIAL BIOPSIES;  Surgeon: Brenna Adine CROME, DO;  Location: [facility];  Service: Pulmonary;;   BRONCHIAL BRUSHINGS  09/27/2021   Procedure: BRONCHIAL BRUSHINGS;  Surgeon: Brenna Adine CROME, DO;  Location: [facility];  Service: Pulmonary;;   BRONCHIAL NEEDLE ASPIRATION BIOPSY  09/27/2021   Procedure: BRONCHIAL NEEDLE ASPIRATION BIOPSIES;  Surgeon: Brenna Adine CROME, DO;  Location: [facility];  Service: Pulmonary;;   COLONOSCOPY     IR IMAGING GUIDED PORT INSERTION  10/24/2021   left breast surgery Left 1968   VIDEO BRONCHOSCOPY WITH RADIAL ENDOBRONCHIAL ULTRASOUND  09/27/2021   Procedure: RADIAL ENDOBRONCHIAL ULTRASOUND;  Surgeon: Brenna Adine CROME, DO;  Location: [facility];  Service: Pulmonary;;    I have reviewed the social history and family history with the patient and they are unchanged from previous note.  ALLERGIES:  has no known allergies.  MEDICATIONS:  Current Outpatient Medications  Medication Sig  Dispense Refill   amLODipine  (NORVASC ) 10 MG tablet Take 1 tablet (10 mg total) by mouth daily. 90 tablet 1   diphenoxylate -atropine  (LOMOTIL ) 2.5-0.025 MG tablet Take 1-2 tablets by mouth 4 (four) times daily as needed for diarrhea or loose stools. 120 tablet 2   gabapentin  (NEURONTIN ) 100 MG capsule Take 2 capsules (200 mg total) by mouth 3 (three) times daily. 180 capsule 2   Multiple Vitamin (MULTIVITAMIN WITH MINERALS) TABS tablet Take 1 tablet by mouth daily.     ondansetron  (ZOFRAN ) 8 MG tablet Take 1 tablet (8 mg total) by mouth every 8 (eight) hours as needed for nausea or vomiting. 30 tablet 3   [START ON 11/20/2023] oxyCODONE  (OXY IR/ROXICODONE ) 5 MG immediate release tablet Take 1 tablet (5 mg total) by mouth every 8 (eight) hours as needed for severe pain (pain score 7-10) or breakthrough pain. 45 tablet 0   prochlorperazine  (COMPAZINE ) 10 MG tablet Take 1 tablet (10 mg total) by mouth every 6 (six) hours as needed (Nausea or vomiting). 30 tablet 1   No current facility-administered medications for this visit.   Facility-Administered Medications Ordered in Other Visits  Medication Dose Route Frequency Provider Last Rate Last Admin   fluorouracil  (ADRUCIL ) 4,450 mg in sodium chloride  0.9 % 61 mL chemo infusion  2,400 mg/m2 (Treatment Plan Recorded) Intravenous 1 day or 1 dose Lanny Callander, MD   Infusion Verify at 11/14/23 1441   heparin  lock flush 100 unit/mL  500 Units Intracatheter Once PRN Lanny Callander, MD  sodium chloride  flush (NS) 0.9 % injection 10 mL  10 mL Intracatheter PRN Lanny Callander, MD        PHYSICAL EXAMINATION: ECOG PERFORMANCE STATUS: 1 - Symptomatic but completely ambulatory  Vitals:   11/14/23 1015 11/14/23 1016  BP: (!) 152/93 (!) 148/83  Pulse: 72   Resp: 18   Temp: (!) 97.5 F (36.4 C)   SpO2: 95%    Wt Readings from Last 3 Encounters:  11/14/23 140 lb 1.6 oz (63.5 kg)  10/25/23 144 lb 3.2 oz (65.4 kg)  10/11/23 146 lb 6.4 oz (66.4 kg)      GENERAL:alert, no distress and comfortable SKIN: skin color, texture, turgor are normal, no rashes or significant lesions EYES: normal, Conjunctiva are pink and non-injected, sclera clear NECK: supple, thyroid  normal size, non-tender, without nodularity LYMPH:  no palpable lymphadenopathy in the cervical, axillary  LUNGS: clear to auscultation and percussion with normal breathing effort HEART: regular rate & rhythm and no murmurs and no lower extremity edema ABDOMEN:abdomen soft, non-tender and normal bowel sounds Musculoskeletal:no cyanosis of digits and no clubbing  NEURO: alert & oriented x 3 with fluent speech, no focal motor/sensory deficits    LABORATORY DATA:  I have reviewed the data as listed    Latest Ref Rng & Units 11/14/2023    9:21 AM 10/25/2023    8:07 AM 10/11/2023    8:02 AM  CBC  WBC 4.0 - 10.5 K/uL 4.6  7.0  6.7   Hemoglobin 13.0 - 17.0 g/dL 85.9  85.6  86.1   Hematocrit 39.0 - 52.0 % 42.9  44.5  43.6   Platelets 150 - 400 K/uL 199  155  172         Latest Ref Rng & Units 11/14/2023    9:21 AM 10/25/2023    8:07 AM 10/11/2023    8:02 AM  CMP  Glucose 70 - 99 mg/dL 92  85  876   BUN 8 - 23 mg/dL 13  10  8    Creatinine 0.61 - 1.24 mg/dL 9.26  9.16  9.19   Sodium 135 - 145 mmol/L 139  137  139   Potassium 3.5 - 5.1 mmol/L 4.4  4.0  3.6   Chloride 98 - 111 mmol/L 105  103  102   CO2 22 - 32 mmol/L 31  30  32   Calcium  8.9 - 10.3 mg/dL 9.1  8.9  8.9   Total Protein 6.5 - 8.1 g/dL 7.0  6.3  6.4   Total Bilirubin 0.0 - 1.2 mg/dL 0.4  0.4  0.4   Alkaline Phos 38 - 126 U/L 147  183  165   AST 15 - 41 U/L 23  21  22    ALT 0 - 44 U/L 18  16  15        RADIOGRAPHIC STUDIES: I have personally reviewed the radiological images as listed and agreed with the findings in the report. No results found.    No orders of the defined types were placed in this encounter.  All questions were answered. The patient knows to call the clinic with any problems, questions or  concerns. No barriers to learning was detected. The total time spent in the appointment was 30 minutes.     Callander Lanny, MD 11/14/2023

## 2023-11-15 ENCOUNTER — Other Ambulatory Visit (HOSPITAL_COMMUNITY): Payer: Self-pay

## 2023-11-15 ENCOUNTER — Encounter: Payer: Self-pay | Admitting: Hematology

## 2023-11-15 ENCOUNTER — Telehealth: Payer: Self-pay | Admitting: Pharmacy Technician

## 2023-11-15 ENCOUNTER — Telehealth: Payer: Self-pay | Admitting: Pharmacist

## 2023-11-15 NOTE — Telephone Encounter (Signed)
Oral Oncology Patient Advocate Encounter   Began application for assistance for Lonsurf through [employer].   Application will be submitted upon completion of necessary supporting documentation.   [employer] phone number [phone].   I will continue to check the status until final determination.   Ruthanne Mcneish, CPhT-Adv Oncology Pharmacy Patient Advocate [facility] Direct Number: [phone]  Fax: [phone]   

## 2023-11-15 NOTE — Telephone Encounter (Signed)
 Oral Oncology Pharmacist Encounter  Received new prescription for Lonsurf  (trifluridine  and tipiracil ) for the treatment of metastatic colon cancer in conjunction with bevacizumab , planned duration until disease progression or unacceptable drug toxicity.  CBC w/ Diff and CMP from 11/14/23 assessed, no relevant lab abnormalities requiring baseline dose adjustment required at this time. Prescription dose and frequency assessed for appropriateness.  Current medication list in Epic reviewed, no relevant/significant DDIs with Lonsurf  identified.  Evaluated chart and no patient barriers to medication adherence noted.   Patient agreement for treatment documented in MD note on 11/14/23.  Since patient does not have prescription drug coverage, [employer] will proceed with applying for patient assistance for patient at this time.   Oral Oncology Clinic will continue to follow for initial counseling and start date.  Jorge [name], PharmD, BCPS, BCOP Hematology/Oncology Clinical Pharmacist [facility] Oral Chemotherapy Navigation Clinics [phone] 11/15/2023 8:57 AM

## 2023-11-15 NOTE — Telephone Encounter (Signed)
 Oral Oncology Patient Advocate Encounter  Spoke with patient's daughter, she will accompany patient to his appointment tomorrow, 11/16/23, patient will sign forms then.  Estefana Moellers, CPhT-Adv Oncology Pharmacy Patient Advocate [facility] Direct Number: [phone]  Fax: [phone]

## 2023-11-16 ENCOUNTER — Inpatient Hospital Stay: Payer: Medicare Other

## 2023-11-16 VITALS — BP 140/85 | HR 94 | Temp 99.6°F | Resp 18

## 2023-11-16 DIAGNOSIS — Z5112 Encounter for antineoplastic immunotherapy: Secondary | ICD-10-CM | POA: Diagnosis not present

## 2023-11-16 DIAGNOSIS — C7800 Secondary malignant neoplasm of unspecified lung: Secondary | ICD-10-CM

## 2023-11-16 MED ORDER — SODIUM CHLORIDE 0.9% FLUSH
10.0000 mL | INTRAVENOUS | Status: DC | PRN
  Administered 2023-11-16: 10 mL

## 2023-11-16 MED ORDER — PEGFILGRASTIM-JMDB 6 MG/0.6ML ~~LOC~~ SOSY
6.0000 mg | PREFILLED_SYRINGE | Freq: Once | SUBCUTANEOUS | Status: AC
  Administered 2023-11-16: 6 mg via SUBCUTANEOUS
  Filled 2023-11-16: qty 0.6

## 2023-11-16 MED ORDER — HEPARIN SOD (PORK) LOCK FLUSH 100 UNIT/ML IV SOLN
500.0000 [IU] | Freq: Once | INTRAVENOUS | Status: AC | PRN
Start: 2023-11-16 — End: 2023-11-16
  Administered 2023-11-16: 500 [IU]

## 2023-11-17 ENCOUNTER — Encounter: Payer: Self-pay | Admitting: Hematology

## 2023-11-19 ENCOUNTER — Other Ambulatory Visit: Payer: Self-pay | Admitting: Nurse Practitioner

## 2023-11-19 DIAGNOSIS — Z515 Encounter for palliative care: Secondary | ICD-10-CM

## 2023-11-19 DIAGNOSIS — R197 Diarrhea, unspecified: Secondary | ICD-10-CM

## 2023-11-19 DIAGNOSIS — C20 Malignant neoplasm of rectum: Secondary | ICD-10-CM

## 2023-11-19 MED ORDER — DIPHENOXYLATE-ATROPINE 2.5-0.025 MG PO TABS
1.0000 | ORAL_TABLET | Freq: Four times a day (QID) | ORAL | 2 refills | Status: DC | PRN
Start: 2023-11-19 — End: 2023-12-13

## 2023-11-20 NOTE — Telephone Encounter (Addendum)
 Oral Oncology Patient Advocate Encounter   Submitted application for assistance for Lonsurf  to [employer].   Application submitted via e-fax to [phone]   [facility] phone number [phone].   I will continue to check the status until final determination.   Estefana Moellers, CPhT-Adv Oncology Pharmacy Patient Advocate [facility] Direct Number: [phone]  Fax: [phone]

## 2023-11-22 ENCOUNTER — Ambulatory Visit: Payer: Medicare Other

## 2023-11-22 ENCOUNTER — Ambulatory Visit: Payer: Medicare Other | Admitting: Nurse Practitioner

## 2023-11-22 ENCOUNTER — Other Ambulatory Visit: Payer: Medicare Other

## 2023-11-23 NOTE — Telephone Encounter (Signed)
Oral Oncology Patient Advocate Encounter  Called to check status of the PAP application. The program has been trying to get in touch with the patient's daughter to finalize the application.  I called and spoke with the daughter and let her know they were trying to reach her. She had not been answering the calls as they came from an 800 number. She says she will call them back now that she is aware.  Jinger Neighbors, CPhT-Adv Oncology Pharmacy Patient Advocate [facility] Direct Number: [phone]  Fax: [phone]

## 2023-11-24 ENCOUNTER — Inpatient Hospital Stay (HOSPITAL_COMMUNITY)
Admission: EM | Admit: 2023-11-24 | Discharge: 2023-11-27 | DRG: 193 | Disposition: A | Discharge status: Home Health | Payer: Medicare Other | Attending: Internal Medicine | Admitting: Internal Medicine

## 2023-11-24 ENCOUNTER — Other Ambulatory Visit: Payer: Self-pay

## 2023-11-24 ENCOUNTER — Emergency Department (HOSPITAL_COMMUNITY): Payer: Medicare Other

## 2023-11-24 ENCOUNTER — Encounter (HOSPITAL_COMMUNITY): Payer: Self-pay

## 2023-11-24 DIAGNOSIS — I2510 Atherosclerotic heart disease of native coronary artery without angina pectoris: Secondary | ICD-10-CM | POA: Diagnosis present

## 2023-11-24 DIAGNOSIS — F17210 Nicotine dependence, cigarettes, uncomplicated: Secondary | ICD-10-CM | POA: Diagnosis present

## 2023-11-24 DIAGNOSIS — E7220 Disorder of urea cycle metabolism, unspecified: Secondary | ICD-10-CM

## 2023-11-24 DIAGNOSIS — E876 Hypokalemia: Secondary | ICD-10-CM | POA: Diagnosis present

## 2023-11-24 DIAGNOSIS — I471 Supraventricular tachycardia, unspecified: Secondary | ICD-10-CM | POA: Diagnosis present

## 2023-11-24 DIAGNOSIS — Z79899 Other long term (current) drug therapy: Secondary | ICD-10-CM

## 2023-11-24 DIAGNOSIS — I10 Essential (primary) hypertension: Secondary | ICD-10-CM | POA: Diagnosis present

## 2023-11-24 DIAGNOSIS — R000 Tachycardia, unspecified: Secondary | ICD-10-CM | POA: Diagnosis not present

## 2023-11-24 DIAGNOSIS — R188 Other ascites: Secondary | ICD-10-CM | POA: Diagnosis present

## 2023-11-24 DIAGNOSIS — Z1152 Encounter for screening for COVID-19: Secondary | ICD-10-CM | POA: Diagnosis not present

## 2023-11-24 DIAGNOSIS — C775 Secondary and unspecified malignant neoplasm of intrapelvic lymph nodes: Secondary | ICD-10-CM | POA: Diagnosis present

## 2023-11-24 DIAGNOSIS — I472 Ventricular tachycardia, unspecified: Secondary | ICD-10-CM | POA: Diagnosis present

## 2023-11-24 DIAGNOSIS — I499 Cardiac arrhythmia, unspecified: Secondary | ICD-10-CM | POA: Diagnosis not present

## 2023-11-24 DIAGNOSIS — C20 Malignant neoplasm of rectum: Secondary | ICD-10-CM | POA: Diagnosis present

## 2023-11-24 DIAGNOSIS — Z85048 Personal history of other malignant neoplasm of rectum, rectosigmoid junction, and anus: Secondary | ICD-10-CM

## 2023-11-24 DIAGNOSIS — I4891 Unspecified atrial fibrillation: Secondary | ICD-10-CM | POA: Diagnosis present

## 2023-11-24 DIAGNOSIS — D849 Immunodeficiency, unspecified: Secondary | ICD-10-CM | POA: Diagnosis present

## 2023-11-24 DIAGNOSIS — J189 Pneumonia, unspecified organism: Secondary | ICD-10-CM | POA: Diagnosis not present

## 2023-11-24 DIAGNOSIS — G9340 Encephalopathy, unspecified: Secondary | ICD-10-CM | POA: Diagnosis not present

## 2023-11-24 DIAGNOSIS — E871 Hypo-osmolality and hyponatremia: Secondary | ICD-10-CM

## 2023-11-24 DIAGNOSIS — J1000 Influenza due to other identified influenza virus with unspecified type of pneumonia: Secondary | ICD-10-CM | POA: Diagnosis not present

## 2023-11-24 DIAGNOSIS — J1001 Influenza due to other identified influenza virus with the same other identified influenza virus pneumonia: Secondary | ICD-10-CM | POA: Diagnosis present

## 2023-11-24 DIAGNOSIS — R109 Unspecified abdominal pain: Secondary | ICD-10-CM

## 2023-11-24 DIAGNOSIS — I959 Hypotension, unspecified: Secondary | ICD-10-CM | POA: Diagnosis present

## 2023-11-24 DIAGNOSIS — K529 Noninfective gastroenteritis and colitis, unspecified: Secondary | ICD-10-CM | POA: Diagnosis present

## 2023-11-24 DIAGNOSIS — R64 Cachexia: Secondary | ICD-10-CM | POA: Diagnosis present

## 2023-11-24 DIAGNOSIS — J439 Emphysema, unspecified: Secondary | ICD-10-CM | POA: Diagnosis present

## 2023-11-24 DIAGNOSIS — G929 Unspecified toxic encephalopathy: Secondary | ICD-10-CM | POA: Diagnosis present

## 2023-11-24 DIAGNOSIS — R4182 Altered mental status, unspecified: Principal | ICD-10-CM

## 2023-11-24 DIAGNOSIS — C787 Secondary malignant neoplasm of liver and intrahepatic bile duct: Secondary | ICD-10-CM | POA: Diagnosis present

## 2023-11-24 DIAGNOSIS — K7682 Hepatic encephalopathy: Secondary | ICD-10-CM | POA: Diagnosis present

## 2023-11-24 DIAGNOSIS — J101 Influenza due to other identified influenza virus with other respiratory manifestations: Secondary | ICD-10-CM

## 2023-11-24 LAB — COMPREHENSIVE METABOLIC PANEL
ALT: 33 U/L (ref 0–44)
AST: 50 U/L — ABNORMAL HIGH (ref 15–41)
Albumin: 2.6 g/dL — ABNORMAL LOW (ref 3.5–5.0)
Alkaline Phosphatase: 130 U/L — ABNORMAL HIGH (ref 38–126)
Anion gap: 13 (ref 5–15)
BUN: 9 mg/dL (ref 8–23)
CO2: 21 mmol/L — ABNORMAL LOW (ref 22–32)
Calcium: 7.9 mg/dL — ABNORMAL LOW (ref 8.9–10.3)
Chloride: 94 mmol/L — ABNORMAL LOW (ref 98–111)
Creatinine, Ser: 0.78 mg/dL (ref 0.61–1.24)
GFR, Estimated: >60 mL/min (ref >60)
Glucose, Bld: 128 mg/dL — ABNORMAL HIGH (ref 70–99)
Potassium: 3.9 mmol/L (ref 3.5–5.1)
Sodium: 128 mmol/L — ABNORMAL LOW (ref 135–145)
Total Bilirubin: 1.3 mg/dL — ABNORMAL HIGH (ref 0.0–1.2)
Total Protein: 6.2 g/dL — ABNORMAL LOW (ref 6.5–8.1)

## 2023-11-24 LAB — CBC WITH DIFFERENTIAL/PLATELET
Abs Immature Granulocytes: 0.16 10*3/uL — ABNORMAL HIGH (ref 0.00–0.07)
Basophils Absolute: 0.1 10*3/uL (ref 0.0–0.1)
Basophils Relative: 0 %
Eosinophils Absolute: 0 10*3/uL (ref 0.0–0.5)
Eosinophils Relative: 0 %
HCT: 41.8 % (ref 39.0–52.0)
Hemoglobin: 13.4 g/dL (ref 13.0–17.0)
Immature Granulocytes: 1 %
Lymphocytes Relative: 7 %
Lymphs Abs: 0.8 10*3/uL (ref 0.7–4.0)
MCH: 28.3 pg (ref 26.0–34.0)
MCHC: 32.1 g/dL (ref 30.0–36.0)
MCV: 88.2 fL (ref 80.0–100.0)
Monocytes Absolute: 1.6 10*3/uL — ABNORMAL HIGH (ref 0.1–1.0)
Monocytes Relative: 14 %
Neutro Abs: 8.9 10*3/uL — ABNORMAL HIGH (ref 1.7–7.7)
Neutrophils Relative %: 78 %
Platelets: 329 10*3/uL (ref 150–400)
RBC: 4.74 MIL/uL (ref 4.22–5.81)
RDW: 16.3 % — ABNORMAL HIGH (ref 11.5–15.5)
WBC: 11.5 10*3/uL — ABNORMAL HIGH (ref 4.0–10.5)
nRBC: 0 % (ref 0.0–0.2)

## 2023-11-24 LAB — TSH: TSH: 2.612 u[IU]/mL (ref 0.350–4.500)

## 2023-11-24 LAB — URINALYSIS, W/ REFLEX TO CULTURE (INFECTION SUSPECTED)
Bilirubin Urine: NEGATIVE
Glucose, UA: 50 mg/dL — AB
Ketones, ur: NEGATIVE mg/dL
Leukocytes,Ua: NEGATIVE
Nitrite: NEGATIVE
Protein, ur: >=300 mg/dL — AB
Specific Gravity, Urine: 1.013 (ref 1.005–1.030)
pH: 7 (ref 5.0–8.0)

## 2023-11-24 LAB — PROTIME-INR
INR: 1.4 — ABNORMAL HIGH (ref 0.8–1.2)
Prothrombin Time: 17.6 s — ABNORMAL HIGH (ref 11.4–15.2)

## 2023-11-24 LAB — LACTIC ACID, PLASMA: Lactic Acid, Venous: 1.9 mmol/L (ref 0.5–1.9)

## 2023-11-24 LAB — AMMONIA: Ammonia: 59 umol/L — ABNORMAL HIGH (ref 9–35)

## 2023-11-24 LAB — T4, FREE: Free T4: 0.99 ng/dL (ref 0.61–1.12)

## 2023-11-24 LAB — CBG MONITORING, ED: Glucose-Capillary: 101 mg/dL — ABNORMAL HIGH (ref 70–99)

## 2023-11-24 LAB — APTT: aPTT: 32 s (ref 24–36)

## 2023-11-24 LAB — OSMOLALITY: Osmolality: 279 mosm/kg (ref 275–295)

## 2023-11-24 LAB — MRSA NEXT GEN BY PCR, NASAL: MRSA by PCR Next Gen: NOT DETECTED

## 2023-11-24 LAB — TROPONIN I (HIGH SENSITIVITY)
Troponin I (High Sensitivity): 11 ng/L (ref <18)
Troponin I (High Sensitivity): 16 ng/L (ref <18)

## 2023-11-24 LAB — RESP PANEL BY RT-PCR (RSV, FLU A&B, COVID)  RVPGX2
Influenza A by PCR: POSITIVE — AB
Influenza B by PCR: NEGATIVE
Resp Syncytial Virus by PCR: NEGATIVE
SARS Coronavirus 2 by RT PCR: NEGATIVE

## 2023-11-24 LAB — SODIUM, URINE, RANDOM: Sodium, Ur: 11 mmol/L

## 2023-11-24 LAB — BRAIN NATRIURETIC PEPTIDE: B Natriuretic Peptide: 357.7 pg/mL — ABNORMAL HIGH (ref 0.0–100.0)

## 2023-11-24 LAB — MAGNESIUM: Magnesium: 2.4 mg/dL (ref 1.7–2.4)

## 2023-11-24 MED ORDER — ORAL CARE MOUTH RINSE: 15.0000 mL | OROMUCOSAL | Status: DC | PRN

## 2023-11-24 MED ORDER — SODIUM CHLORIDE 0.9 % IV SOLN
500.0000 mg | Freq: Once | INTRAVENOUS | Status: AC
  Administered 2023-11-24: 500 mg via INTRAVENOUS
  Filled 2023-11-24: qty 5

## 2023-11-24 MED ORDER — FENTANYL CITRATE PF 50 MCG/ML IJ SOSY
PREFILLED_SYRINGE | INTRAMUSCULAR | Status: AC
  Administered 2023-11-24: 50 ug via INTRAVENOUS
  Filled 2023-11-24: qty 1

## 2023-11-24 MED ORDER — ENOXAPARIN SODIUM 40 MG/0.4ML IJ SOSY
40.0000 mg | PREFILLED_SYRINGE | INTRAMUSCULAR | Status: DC
  Administered 2023-11-25 – 2023-11-27 (×3): 40 mg via SUBCUTANEOUS
  Filled 2023-11-24 (×3): qty 0.4

## 2023-11-24 MED ORDER — ACETAMINOPHEN 650 MG RE SUPP: 650.0000 mg | Freq: Four times a day (QID) | RECTAL | Status: DC | PRN

## 2023-11-24 MED ORDER — PROCHLORPERAZINE MALEATE 10 MG PO TABS
10.0000 mg | ORAL_TABLET | Freq: Four times a day (QID) | ORAL | Status: DC | PRN
  Filled 2023-11-24: qty 1

## 2023-11-24 MED ORDER — AMIODARONE IV BOLUS ONLY 150 MG/100ML
150.0000 mg | Freq: Once | INTRAVENOUS | Status: AC
  Administered 2023-11-24: 150 mg via INTRAVENOUS
  Filled 2023-11-24: qty 100

## 2023-11-24 MED ORDER — PROCHLORPERAZINE EDISYLATE 10 MG/2ML IJ SOLN
5.0000 mg | Freq: Four times a day (QID) | INTRAMUSCULAR | Status: DC | PRN
  Administered 2023-11-24: 5 mg via INTRAVENOUS
  Filled 2023-11-24 (×2): qty 2

## 2023-11-24 MED ORDER — VANCOMYCIN HCL IN DEXTROSE 1-5 GM/200ML-% IV SOLN
1000.0000 mg | Freq: Once | INTRAVENOUS | Status: AC
  Administered 2023-11-24: 1000 mg via INTRAVENOUS
  Filled 2023-11-24: qty 200

## 2023-11-24 MED ORDER — SODIUM CHLORIDE 0.9 % IV SOLN: 500.0000 mg | INTRAVENOUS | Status: DC

## 2023-11-24 MED ORDER — OSELTAMIVIR PHOSPHATE 75 MG PO CAPS
75.0000 mg | ORAL_CAPSULE | Freq: Two times a day (BID) | ORAL | Status: DC
  Administered 2023-11-24 – 2023-11-26 (×3): 75 mg via ORAL
  Filled 2023-11-24 (×4): qty 1

## 2023-11-24 MED ORDER — OXYCODONE HCL 5 MG PO TABS
5.0000 mg | ORAL_TABLET | Freq: Three times a day (TID) | ORAL | Status: DC | PRN
  Filled 2023-11-24: qty 1

## 2023-11-24 MED ORDER — SIMETHICONE 80 MG PO CHEW
80.0000 mg | CHEWABLE_TABLET | Freq: Once | ORAL | Status: DC
  Filled 2023-11-24: qty 1

## 2023-11-24 MED ORDER — PIPERACILLIN-TAZOBACTAM 3.375 G IVPB
3.3750 g | Freq: Three times a day (TID) | INTRAVENOUS | Status: DC
  Administered 2023-11-25 – 2023-11-26 (×4): 3.375 g via INTRAVENOUS
  Filled 2023-11-24 (×4): qty 50

## 2023-11-24 MED ORDER — PIPERACILLIN-TAZOBACTAM 3.375 G IVPB 30 MIN
3.3750 g | Freq: Once | INTRAVENOUS | Status: AC
  Administered 2023-11-24: 3.375 g via INTRAVENOUS
  Filled 2023-11-24: qty 50

## 2023-11-24 MED ORDER — SODIUM CHLORIDE 0.9 % IV SOLN
100.0000 mg | Freq: Two times a day (BID) | INTRAVENOUS | Status: DC
  Administered 2023-11-25 (×2): 100 mg via INTRAVENOUS
  Filled 2023-11-24 (×2): qty 100

## 2023-11-24 MED ORDER — RIFAXIMIN 550 MG PO TABS
550.0000 mg | ORAL_TABLET | Freq: Two times a day (BID) | ORAL | Status: DC
Start: 2023-11-24 — End: 2023-11-26
  Administered 2023-11-24 – 2023-11-26 (×4): 550 mg via ORAL
  Filled 2023-11-24 (×5): qty 1

## 2023-11-24 MED ORDER — LACTATED RINGERS IV BOLUS
1000.0000 mL | Freq: Once | INTRAVENOUS | Status: AC
  Administered 2023-11-24: 1000 mL via INTRAVENOUS

## 2023-11-24 MED ORDER — POLYETHYLENE GLYCOL 3350 17 G PO PACK
17.0000 g | PACK | Freq: Every day | ORAL | Status: DC | PRN
Start: 2023-11-24 — End: 2023-11-26
  Filled 2023-11-24: qty 1

## 2023-11-24 MED ORDER — FENTANYL CITRATE PF 50 MCG/ML IJ SOSY: 50.0000 ug | PREFILLED_SYRINGE | Freq: Once | INTRAMUSCULAR | Status: AC

## 2023-11-24 MED ORDER — ACETAMINOPHEN 325 MG PO TABS
650.0000 mg | ORAL_TABLET | Freq: Four times a day (QID) | ORAL | Status: DC | PRN
Start: 2023-11-24 — End: 2023-11-26

## 2023-11-24 MED ORDER — PANTOPRAZOLE SODIUM 40 MG IV SOLR
40.0000 mg | INTRAVENOUS | Status: DC
  Administered 2023-11-24 – 2023-11-26 (×2): 40 mg via INTRAVENOUS
  Filled 2023-11-24 (×2): qty 10

## 2023-11-24 MED ORDER — IOHEXOL 350 MG/ML SOLN
75.0000 mL | Freq: Once | INTRAVENOUS | Status: AC | PRN
  Administered 2023-11-24: 75 mL via INTRAVENOUS

## 2023-11-24 MED ORDER — SODIUM CHLORIDE 0.9 % IV SOLN: 1.0000 g | Freq: Once | INTRAVENOUS | Status: DC

## 2023-11-24 MED ORDER — CHLORHEXIDINE GLUCONATE CLOTH 2 % EX PADS
6.0000 | MEDICATED_PAD | Freq: Every day | CUTANEOUS | Status: DC
  Administered 2023-11-24 – 2023-11-27 (×4): 6 via TOPICAL

## 2023-11-24 NOTE — Assessment & Plan Note (Addendum)
Trace poor, seems to have been progressing for the last 4 days, certainly definitely worsened after patient was cardioverted and prior to that.  Overall this seems to be a metabolic encephalopathy in nature given the metabolic issues going on including hypotension as well as pneumonia and possible hypoxemia.  It seems to be improving now patient is interacting with family interacting with the Jorge [name] a little bit.  Follows direction with all 4 extremities.  We will monitor clinically. CT head wnl  Patinet also has elevated ammonia liely due to hepatic metastasis seen on 10/09/2023 ct. Will give rifaximin. For hepatic encephalopathy

## 2023-11-24 NOTE — ED Notes (Addendum)
Lab called to add on Sodium, urine, random

## 2023-11-24 NOTE — Assessment & Plan Note (Addendum)
Technically meeting sepsis criteria. Influenza positive and likely cause of the vacuolated neutrophils seen on CBC (but will keep a close eye on these).  Slight hypoxemia to 88% documented  See chest CT above.  S/p vancomycin and Zosyn and azithromycin.  Continue with Zosyn , change azithro to doxycycline to avoid Qtc prolongation. add oseltamivir

## 2023-11-24 NOTE — H&P (Signed)
History and Physical    Patient: Jorge [name] KGM:[id number] DOB: 08-Aug-1951 DOA: 11/24/2023 DOS: the patient was seen and examined on 11/24/2023 PCP: Pcp, No  Patient coming from: Home  Chief Complaint:  Chief Complaint  Patient presents with   Altered Mental Status   HPI: Jorge [name] is a 73 y.o. male with medical history significant of rectal ca metastatic that is under chemotherapy Rx, see below. At baselie patine is AAOx4. Plays chess  History is principally from patient's grandson at the bedside his name is [name].  Also corroborated by the patient to some extent.  Although open history taking from the patient is pretty difficult.  Understanding is that patient seems to have been in his usual state of health till about 4 days ago.  Since that time patient has been "little bit incoherent ".  Further details of this are hard to get by.  Patient has been having generalized bodyaches.  Also has been complaining of abdominal pain.  It is questionable if he has had diarrhea for 4 days or just today.  Patient reports having 2 loose bowel movements today.  There is certainly no report of vomiting.  No documented fever.  No dysuria or blood in his urine.  Per patient's grandson at the bedside patient was complaining of a lot of belly pain today since around 7 AM.  Patient does not report any description of pain any radiation or any aggravating or relieving factors.  The ongoing belly pain prompted the grandson to call EMS.  Patient actually denies any belly pain at this time.  Apparently patient was completely stable when he was brought in by EMS.  However on arrival patient was noted to be in marked tachycardia up to heart rate of 220 per report obtained from ER provider.  Blood pressure 74/51 and EKG was demonstrating wide complex tachycardia.  Patinet had cardioversion performed by ER attending.    Per report, patient was subsequently noted to be in altered mental status and  incomprehensible for prolonged period of time.  Medical evaluation was sought.  Further history is obtained from patient.  Which is limited.  However patient is currently interacting.  Gives me his home address.  Knows he is at [facility] and really offers no complaints.  Just wants to rest and go back home.   Review of Systems: As mentioned in the history of present illness. All other systems reviewed and are negative. 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   Past Surgical History:  Procedure Laterality Date   BRONCHIAL BIOPSY  09/27/2021   Procedure: BRONCHIAL BIOPSIES;  Surgeon: Josephine Igo, DO;  Location: [facility];  Service: Pulmonary;;   BRONCHIAL BRUSHINGS  09/27/2021   Procedure: BRONCHIAL BRUSHINGS;  Surgeon: Josephine Igo, DO;  Location: [facility];  Service: Pulmonary;;   BRONCHIAL NEEDLE ASPIRATION BIOPSY  09/27/2021   Procedure: BRONCHIAL NEEDLE ASPIRATION BIOPSIES;  Surgeon: Josephine Igo, DO;  Location: [facility];  Service: Pulmonary;;   COLONOSCOPY     IR IMAGING GUIDED PORT INSERTION  10/24/2021   left breast surgery Left 1968   VIDEO BRONCHOSCOPY WITH RADIAL ENDOBRONCHIAL ULTRASOUND  09/27/2021   Procedure: RADIAL ENDOBRONCHIAL ULTRASOUND;  Surgeon: Josephine Igo, DO;  Location: [facility];  Service: Pulmonary;;   Social History:  reports that he has been smoking cigarettes. He has a 5 pack-year smoking history. He has never used  smokeless tobacco. He reports that he does not currently use alcohol. He reports that he does not use drugs.  No Known Allergies  Family History  Problem Relation Age of Onset   Cancer Mother 59       unknown type cancer   Colon cancer Neg Hx    Esophageal cancer Neg Hx    Rectal cancer Neg Hx    Stomach cancer Neg Hx    Inflammatory bowel disease Neg Hx    Liver disease Neg Hx    Pancreatic cancer Neg Hx     Prior to  Admission medications   Medication Sig Start Date End Date Taking? Authorizing Provider  amLODipine (NORVASC) 10 MG tablet Take 1 tablet (10 mg total) by mouth daily. 11/14/23  Yes Malachy Mood, MD  diphenoxylate-atropine (LOMOTIL) 2.5-0.025 MG tablet Take 1-2 tablets by mouth 4 (four) times daily as needed for diarrhea or loose stools. 11/19/23  Yes Pickenpack-Cousar, Arty Baumgartner, NP  gabapentin (NEURONTIN) 100 MG capsule Take 2 capsules (200 mg total) by mouth 3 (three) times daily. 11/14/23  Yes Malachy Mood, MD  oxyCODONE (OXY IR/ROXICODONE) 5 MG immediate release tablet Take 1 tablet (5 mg total) by mouth every 8 (eight) hours as needed for severe pain (pain score 7-10) or breakthrough pain. 11/20/23  Yes Pickenpack-Cousar, Arty Baumgartner, NP  prochlorperazine (COMPAZINE) 10 MG tablet Take 1 tablet (10 mg total) by mouth every 6 (six) hours as needed (Nausea or vomiting). 11/14/23  Yes Malachy Mood, MD  Pseudoephedrine-APAP-DM (DAYQUIL PO) Take 1 tablet by mouth as needed.   Yes [provider]  Multiple Vitamin (MULTIVITAMIN WITH MINERALS) TABS tablet Take 1 tablet by mouth daily. Patient not taking: Reported on 11/24/2023    [provider]  ondansetron (ZOFRAN) 8 MG tablet Take 1 tablet (8 mg total) by mouth every 8 (eight) hours as needed for nausea or vomiting. Patient not taking: Reported on 11/24/2023 03/29/23   Malachy Mood, MD  trifluridine-tipiracil (LONSURF) 20-8.19 MG tablet Take 3 tablets (60 mg of trifluridine total) by mouth 2 (two) times daily after a meal. Take within 1 hr after AM & PM meals on days 1-5, 8-12. Repeat every 28 days. Patient not taking: Reported on 11/24/2023 11/14/23   Malachy Mood, MD  ondansetron (ZOFRAN) 8 MG tablet Take 1 tablet (8 mg total) by mouth every 8 (eight) hours as needed for nausea or vomiting. 09/09/21   Malachy Mood, MD    Physical Exam: Vitals:   11/24/23 1129 11/24/23 1141 11/24/23 1215 11/24/23 1535  BP: (!) 74/51 124/64 136/68 (!) 114/58  Pulse: (!) 218 (!)  117 (!) 102 (!) 35  Resp: 18 20 20  (!) 22  Temp: 99 F (37.2 C)     TempSrc: Oral     SpO2: 98% (!) 88% 94% 94%  Weight:      Height:       General: Thin gentleman in bed.  Is interacting with family, over the phone, as well as grandson at the bedside occasionally.  But principally wants to rest and sleep.  Does not appear distressed.  However is very apprehensive to touch is including from family member.  Oriented to location.  Interactive reasonably well, however preferring to sleep. Respiratory exam: Bilateral intravesicular Cardiovascular exam S1-S2 normal Abdomen scaphoid, all quadrants appear soft nontender without any guarding or rebound Extremities warm without edema moves all 4 extremities to direction.  Data Reviewed:  Labs on Admission:  Results for orders placed or performed during the  hospital encounter of 11/24/23 (from the past 24 hours)  CBC with Differential     Status: Abnormal   Collection Time: 11/24/23 11:24 AM  Result Value Ref Range   WBC 11.5 (H) 4.0 - 10.5 K/uL   RBC 4.74 4.22 - 5.81 MIL/uL   Hemoglobin 13.4 13.0 - 17.0 g/dL   HCT 32.4 40.1 - 02.7 %   MCV 88.2 80.0 - 100.0 fL   MCH 28.3 26.0 - 34.0 pg   MCHC 32.1 30.0 - 36.0 g/dL   RDW 25.3 (H) 66.4 - 40.3 %   Platelets 329 150 - 400 K/uL   nRBC 0.0 0.0 - 0.2 %   Neutrophils Relative % 78 %   Neutro Abs 8.9 (H) 1.7 - 7.7 K/uL   Lymphocytes Relative 7 %   Lymphs Abs 0.8 0.7 - 4.0 K/uL   Monocytes Relative 14 %   Monocytes Absolute 1.6 (H) 0.1 - 1.0 K/uL   Eosinophils Relative 0 %   Eosinophils Absolute 0.0 0.0 - 0.5 K/uL   Basophils Relative 0 %   Basophils Absolute 0.1 0.0 - 0.1 K/uL   WBC Morphology VACUOLATED NEUTROPHILS    Immature Granulocytes 1 %   Abs Immature Granulocytes 0.16 (H) 0.00 - 0.07 K/uL  Comprehensive metabolic panel     Status: Abnormal   Collection Time: 11/24/23 11:24 AM  Result Value Ref Range   Sodium 128 (L) 135 - 145 mmol/L   Potassium 3.9 3.5 - 5.1 mmol/L   Chloride  94 (L) 98 - 111 mmol/L   CO2 21 (L) 22 - 32 mmol/L   Glucose, Bld 128 (H) 70 - 99 mg/dL   BUN 9 8 - 23 mg/dL   Creatinine, Ser 4.74 0.61 - 1.24 mg/dL   Calcium 7.9 (L) 8.9 - 10.3 mg/dL   Total Protein 6.2 (L) 6.5 - 8.1 g/dL   Albumin 2.6 (L) 3.5 - 5.0 g/dL   AST 50 (H) 15 - 41 U/L   ALT 33 0 - 44 U/L   Alkaline Phosphatase 130 (H) 38 - 126 U/L   Total Bilirubin 1.3 (H) 0.0 - 1.2 mg/dL   GFR, Estimated >25 >95 mL/min   Anion gap 13 5 - 15  Urinalysis, w/ Reflex to Culture (Infection Suspected) -Urine, Clean Catch     Status: Abnormal   Collection Time: 11/24/23 11:24 AM  Result Value Ref Range   Specimen Source URINE, CLEAN CATCH    Color, Urine AMBER (A) YELLOW   APPearance HAZY (A) CLEAR   Specific Gravity, Urine 1.013 1.005 - 1.030   pH 7.0 5.0 - 8.0   Glucose, UA 50 (A) NEGATIVE mg/dL   Hgb urine dipstick SMALL (A) NEGATIVE   Bilirubin Urine NEGATIVE NEGATIVE   Ketones, ur NEGATIVE NEGATIVE mg/dL   Protein, ur >=638 (A) NEGATIVE mg/dL   Nitrite NEGATIVE NEGATIVE   Leukocytes,Ua NEGATIVE NEGATIVE   RBC / HPF 0-5 0 - 5 RBC/hpf   WBC, UA 6-10 0 - 5 WBC/hpf   Bacteria, UA RARE (A) NONE SEEN   Squamous Epithelial / HPF 0-5 0 - 5 /HPF   Mucus PRESENT    Hyaline Casts, UA PRESENT   Ammonia     Status: Abnormal   Collection Time: 11/24/23 11:25 AM  Result Value Ref Range   Ammonia 59 (H) 9 - 35 umol/L  Magnesium     Status: None   Collection Time: 11/24/23 11:40 AM  Result Value Ref Range   Magnesium 2.4 1.7 - 2.4 mg/dL  Troponin I (High Sensitivity)     Status: None   Collection Time: 11/24/23 11:40 AM  Result Value Ref Range   Troponin I (High Sensitivity) 11 <18 ng/L  Protime-INR     Status: Abnormal   Collection Time: 11/24/23 11:40 AM  Result Value Ref Range   Prothrombin Time 17.6 (H) 11.4 - 15.2 seconds   INR 1.4 (H) 0.8 - 1.2  APTT     Status: None   Collection Time: 11/24/23 11:40 AM  Result Value Ref Range   aPTT 32 24 - 36 seconds  TSH     Status: None    Collection Time: 11/24/23 11:43 AM  Result Value Ref Range   TSH 2.612 0.350 - 4.500 uIU/mL  Resp panel by RT-PCR (RSV, Flu A&B, Covid) Anterior Nasal Swab     Status: Abnormal   Collection Time: 11/24/23 11:48 AM   Specimen: Anterior Nasal Swab  Result Value Ref Range   SARS Coronavirus 2 by RT PCR NEGATIVE NEGATIVE   Influenza A by PCR POSITIVE (A) NEGATIVE   Influenza B by PCR NEGATIVE NEGATIVE   Resp Syncytial Virus by PCR NEGATIVE NEGATIVE  POC CBG, ED     Status: Abnormal   Collection Time: 11/24/23 11:49 AM  Result Value Ref Range   Glucose-Capillary 101 (H) 70 - 99 mg/dL  Brain natriuretic peptide     Status: Abnormal   Collection Time: 11/24/23 11:49 AM  Result Value Ref Range   B Natriuretic Peptide 357.7 (H) 0.0 - 100.0 pg/mL  Lactic acid, plasma     Status: None   Collection Time: 11/24/23  1:02 PM  Result Value Ref Range   Lactic Acid, Venous 1.9 0.5 - 1.9 mmol/L  Troponin I (High Sensitivity)     Status: None   Collection Time: 11/24/23  1:43 PM  Result Value Ref Range   Troponin I (High Sensitivity) 16 <18 ng/L   Basic Metabolic Panel: Recent Labs  Lab 11/24/23 1124 11/24/23 1140  NA 128*  --   K 3.9  --   CL 94*  --   CO2 21*  --   GLUCOSE 128*  --   BUN 9  --   CREATININE 0.78  --   CALCIUM 7.9*  --   MG  --  2.4   Liver Function Tests: Recent Labs  Lab 11/24/23 1124  AST 50*  ALT 33  ALKPHOS 130*  BILITOT 1.3*  PROT 6.2*  ALBUMIN 2.6*   No results for input(s): "LIPASE", "AMYLASE" in the last 168 hours. Recent Labs  Lab 11/24/23 1125  AMMONIA 59*   CBC: Recent Labs  Lab 11/24/23 1124  WBC 11.5*  NEUTROABS 8.9*  HGB 13.4  HCT 41.8  MCV 88.2  PLT 329   Cardiac Enzymes: Recent Labs  Lab 11/24/23 1140 11/24/23 1343  TROPONINIHS 11 16    BNP (last 3 results) No results for input(s): "PROBNP" in the last 8760 hours. CBG: Recent Labs  Lab 11/24/23 1149  GLUCAP 101*    Radiological Exams on Admission:  CT Angio Chest  PE W and/or Wo Contrast Result Date: 11/24/2023 CLINICAL DATA:  PE suspected. High probability. History of colorectal carcinoma. * Tracking Code: BO * EXAM: CT ANGIOGRAPHY CHEST WITH CONTRAST TECHNIQUE: Multidetector CT imaging of the chest was performed using the standard protocol during bolus administration of intravenous contrast. Multiplanar CT image reconstructions and MIPs were obtained to evaluate the vascular anatomy. RADIATION DOSE REDUCTION: This exam was performed according to the  departmental dose-optimization program which includes automated exposure control, adjustment of the mA and/or kV according to patient size and/or use of iterative reconstruction technique. CONTRAST:  75mL OMNIPAQUE IOHEXOL 350 MG/ML SOLN COMPARISON:  10/09/2023. FINDINGS: Cardiovascular: Satisfactory opacification of the pulmonary arteries to the segmental level. No evidence of pulmonary embolism. Normal heart size. No pericardial effusion. Coronary artery calcifications. Mediastinum/Nodes: Thyroid gland, trachea, and esophagus demonstrate no significant findings. Subcarinal lymph node measures 1.1 cm, image 87/4. This is new from previous exam. Enlarged right hilar lymph node is also new measuring 1.9 cm, image 85/4. Lungs/Pleura: Emphysema. Diffuse bronchial wall thickening. No pleural effusion, airspace consolidation, atelectasis or pneumothorax. Multifocal pulmonary nodules are again noted. Index nodule within the right upper lobe measures 2.4 cm, image 71/12. This is compared with 2.5 cm previously. Index nodule within the right middle lobe measures 2.8 cm, image 96/12. Also unchanged from previous exam. Index nodule within the anterior right apex measures 2 cm, image 39/12. SINCE THE PREVIOUS EXAM THERE IS BEEN INTERVAL DEVELOPMENT OF EXTENSIVE IRREGULAR AND ILL-DEFINED IRREGULAR PERIBRONCHOVASCULAR NODULAR DENSITIES THROUGHOUT BOTH LOWER LOBES, RIGHT MIDDLE LOBE AND RIGHT UPPER LOBE.  MANY OF THESE APPEAR TO HAVE A  CAVITARY COMPONENT.  INDEX NODULES INCLUDE: SINCE THE PREVIOUS EXAM THERE IS BEEN INTERVAL DEVELOPMENT OF EXTENSIVE IRREGULAR AND ILL-DEFINED IRREGULAR PERIBRONCHOVASCULAR NODULAR DENSITIES THROUGHOUT BOTH LOWER LOBES, RIGHT MIDDLE LOBE AND RIGHT UPPER LOBE. MANY OF THESE APPEAR TO HAVE A CAVITARY COMPONENT. INDEX NODULES INCLUDE -nodule within the lateral right lower lobe measures 1.2 cm, image 106/2. -Nodule in the posterior right lower lobe measures 1 cm, image 112/2. -nodule within the right upper lobe measures 0.9 cm, image 56/12. Upper Abdomen: No acute abnormality within the imaged portions of the upper abdomen. Sub optimal visualization of previously noted scattered low-density liver lesions due to phase of contrast opacification. Gallbladder appears distended. Small amount of perihepatic ascites overlies the right hepatic lobe. Musculoskeletal: No chest wall abnormality. No acute or significant osseous findings. Review of the MIP images confirms the above findings. IMPRESSION: 1. No evidence for acute pulmonary embolus. 2. Interval development of extensive irregular and ill-defined irregular peribronchovascular nodular densities throughout both lower lobes, right middle lobe and right upper lobe. Associated diffuse bronchial wall thickening is identified throughout both lungs. Findings are favored to represent atypical infection such as fungal or atypical mycobacterial infection. Metastatic disease is considered less favored. 3. No significant change in the appearance of previously noted pulmonary metastasis. 4. New mediastinal and right hilar lymphadenopathy. In the acute setting this may reflect reactive adenopathy secondary to infection. Nodal metastasis is considered less favored but cannot be excluded with a high degree of certainty. 5. Coronary artery calcifications. 6. Small amount of perihepatic ascites overlies the right hepatic lobe. 7.  Emphysema (ICD10-J43.9). Electronically Signed   By: Signa Kell M.D.   On: 11/24/2023 14:33   CT Head Wo Contrast Result Date: 11/24/2023 CLINICAL DATA:  Altered mental status. Incontinence and decreased urine output. Intermittent rectal pain. EXAM: CT HEAD WITHOUT CONTRAST TECHNIQUE: Contiguous axial images were obtained from the base of the skull through the vertex without intravenous contrast. RADIATION DOSE REDUCTION: This exam was performed according to the departmental dose-optimization program which includes automated exposure control, adjustment of the mA and/or kV according to patient size and/or use of iterative reconstruction technique. COMPARISON:  None Available. FINDINGS: Brain: Ventricles, cisterns and other CSF spaces are normal. There is no mass, mass effect, shift of midline structures or acute hemorrhage. No evidence of  acute infarction. Vascular: No hyperdense vessel or unexpected calcification. Skull: Normal. Negative for fracture or focal lesion. Sinuses/Orbits: Orbits are normal. Mild to moderate chronic inflammatory change of the paranasal sinuses predominately involving the maxillary and ethmoid sinuses. Other: None. IMPRESSION: 1. No acute intracranial findings. 2. Mild to moderate chronic inflammatory change of the paranasal sinuses. Electronically Signed   By: Elberta Fortis M.D.   On: 11/24/2023 14:26    EKG: Independently reviewed.  Admission EKG shows wide-complex tachycardia.  Subsequent EKG at 11:39 AM and later shows narrow complex afib.   Assessment and Plan: Ventricular tachycardia (HCC) present on admission, unstable.  S/p cardioversion.  Now converted to atrial fibrillation.  Maintain on telemetry.  Serum mag and potassium levels are not actionable.  I will check an echo. Initial trop was negatvie . No concern for ACS.   Likely precipitated by acute infection/hypoxia, possibly underlying structural heart disease. Needs secondary prophylaxis. Will maintin on telemtry. Treat with amiodarone loading dose. I will engage  cardiology to see if any indication for chornic anti-arrythmic therapy. TSH WNL  Hyponatremia Check serum osm. Clincially this is chronic asymptomtiac moderate with likely cause being prerenal. Check urine sodium. No objection to fluids administration for sepsis  CAP (community acquired pneumonia) Technically meeting sepsis criteria. Influenza positive and likely cause of the vacuolated neutrophils seen on CBC (but will keep a close eye on these).  Slight hypoxemia to 88% documented  See chest CT above.  S/p vancomycin and Zosyn and azithromycin.  Continue with Zosyn , change azithro to doxycycline to avoid Qtc prolongation. add oseltamivir  Abdominal pain Patient currently does not report of any abdominal pain.  This had apparently been going on since this morning.  Exam of the abdomen appears benign.  I will go ahead and get an x-ray abdomen.  Although patient does not reported there has been some incontinence and decreased urine output for a couple of days as per the ER triage note.  We will do a bladder scan as well.  There is some report of patinet hadving had diarrhea at [facility] today. Onc note suggests that this is chornic. There has been no diahrea observed in the ER. Will monitor. Check c diff if diharea.  Acute encephalopathy Trace poor, seems to have been progressing for the last 4 days, certainly definitely worsened after patient was cardioverted and prior to that.  Overall this seems to be a metabolic encephalopathy in nature given the metabolic issues going on including hypotension as well as pneumonia and possible hypoxemia.  It seems to be improving now patient is interacting with family interacting with the Thereasa Parkin a little bit.  Follows direction with all 4 extremities.  We will monitor clinically. CT head wnl  Patinet also has elevated ammonia liely due to hepatic metastasis seen on 10/09/2023 ct. Will give rifaximin. For hepatic encephalopathy  Rectal cancer [facility]) Per onc note  11/14/2023  Metastatic Colon Cancer Metastatic colon cancer with increasing CEA from 17 to 92 and slight enlargement on CT scan from December 3rd. Current regimen is less effective with developing resistance. Discussed transitioning to third-line therapy with oral chemotherapy Lonsurf and bevacizumab infusion every two weeks, which may benefit about one-third of patients. Explained that the new drug is different, not stronger, and the previous regimen may be revisited. - Continue current treatment for today - Start new treatment regimen with oral chemotherapy and bevacizumab infusion on January 27th - Cancel future appointments for the current regimen  DVT ppx with lovenox  ordered. I wil order gi ppx with pantop    Advance Care Planning:   Code Status: Not on file patient wishes to be full code per family  Consults: cardio as above.  Family Communication: disuccsed with family at bedsdie/phone  Severity of Illness: The appropriate patient status for this patient is INPATIENT. Inpatient status is judged to be reasonable and necessary in order to provide the required intensity of service to ensure the patient's safety. The patient's presenting symptoms, physical exam findings, and initial radiographic and laboratory data in the context of their chronic comorbidities is felt to place them at high risk for further clinical deterioration. Furthermore, it is not anticipated that the patient will be medically stable for discharge from the hospital within 2 midnights of admission.   * I certify that at the point of admission it is my clinical judgment that the patient will require inpatient hospital care spanning beyond 2 midnights from the point of admission due to high intensity of service, high risk for further deterioration and high frequency of surveillance required.*  Author: Nolberto Hanlon, MD 11/24/2023 3:48 PM  For on call review [email or web address].

## 2023-11-24 NOTE — Assessment & Plan Note (Addendum)
present on admission, unstable.  S/p cardioversion.  Now converted to atrial fibrillation.  Maintain on telemetry.  Serum mag and potassium levels are not actionable.  I will check an echo. Initial trop was negatvie . No concern for ACS.   Likely precipitated by acute infection/hypoxia, possibly underlying structural heart disease. Needs secondary prophylaxis. Will maintin on telemtry. Treat with amiodarone loading dose. I will engage cardiology to see if any indication for chornic anti-arrythmic therapy. TSH WNL

## 2023-11-24 NOTE — ED Triage Notes (Signed)
Patient BIB EMS for AMS, incontinence and decreased urine output x " a couple days." Patient also has intermittent rectal pain that has caused him to yell help me this AM. Reports it has been "awhile" since he had a BM. Hx or colorectal cancer.  Grandson who lives with patient has the flu.  Patient A&Ox 1 at time of triage.   HR 210 and in V tach on arrival. MD at bedside.

## 2023-11-24 NOTE — ED Notes (Addendum)
Patient shocked with 120 J for 230 Vtach per Dr. Jearld Fenton.

## 2023-11-24 NOTE — Assessment & Plan Note (Addendum)
Patient currently does not report of any abdominal pain.  This had apparently been going on since this morning.  Exam of the abdomen appears benign.  I will go ahead and get an x-ray abdomen.  Although patient does not reported there has been some incontinence and decreased urine output for a couple of days as per the ER triage note.  We will do a bladder scan as well.  There is some report of patinet hadving had diarrhea at [facility] today. Onc note suggests that this is chornic. There has been no diahrea observed in the ER. Will monitor. Check c diff if diharea.

## 2023-11-24 NOTE — Progress Notes (Signed)
A consult was received from an ED physician for vancomycin and cefepime per pharmacy dosing (for an indication other than meningitis). The patient's profile has been reviewed for ht/wt/allergies/indication/available labs. A one time order has been placed for the above antibiotics.  Further antibiotics/pharmacy consults should be ordered by admitting physician if indicated.                       Bernadene Person, PharmD, BCPS [phone] 11/24/2023, 2:06 PM

## 2023-11-24 NOTE — Assessment & Plan Note (Signed)
Per onc note 11/14/2023  Metastatic Colon Cancer Metastatic colon cancer with increasing CEA from 17 to 92 and slight enlargement on CT scan from December 3rd. Current regimen is less effective with developing resistance. Discussed transitioning to third-line therapy with oral chemotherapy Lonsurf and bevacizumab infusion every two weeks, which may benefit about one-third of patients. Explained that the new drug is different, not stronger, and the previous regimen may be revisited. - Continue current treatment for today - Start new treatment regimen with oral chemotherapy and bevacizumab infusion on January 27th - Cancel future appointments for the current regimen

## 2023-11-24 NOTE — ED Provider Notes (Signed)
EMERGENCY DEPARTMENT AT [facility] [place] Provider Note   CSN: [id number] Arrival date & time: 11/24/23  1110     History  Chief Complaint  Patient presents with   Altered Mental Status    Jorge [name] is a 73 y.o. male with PMH as listed below who presents accompanied by son at bedside who provides additional history.  Patient son states that patient lives with his daughter who cares for him.  Patient has cancer and is currently undergoing chemotherapy, last treatment was last week.  Since Thursday (2 days ago) patient has been extremely altered, "nothing he saying is making any sense."  Patient is normally A&O x 4, enjoys playing chess.  No reported trauma.  On arrival patient is tachycardic to 18 bpm, hypotensive 74/51, monitor and EKG demonstrates ventricular tachycardia.   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 Authorizing Provider  amLODipine (NORVASC) 10 MG tablet Take 1 tablet (10 mg total) by mouth daily. 11/14/23  Yes Malachy Mood, MD  diphenoxylate-atropine (LOMOTIL) 2.5-0.025 MG tablet Take 1-2 tablets by mouth 4 (four) times daily as needed for diarrhea or loose stools. 11/19/23  Yes Pickenpack-Cousar, Arty Baumgartner, NP  gabapentin (NEURONTIN) 100 MG capsule Take 2 capsules (200 mg total) by mouth 3 (three) times daily. 11/14/23  Yes Malachy Mood, MD  oxyCODONE (OXY IR/ROXICODONE) 5 MG immediate release tablet Take 1 tablet (5 mg total) by mouth every 8 (eight) hours as needed for severe pain (pain score 7-10) or breakthrough pain. 11/20/23  Yes Pickenpack-Cousar, Arty Baumgartner, NP  prochlorperazine (COMPAZINE) 10 MG tablet Take 1 tablet (10 mg total) by mouth every 6 (six) hours as needed (Nausea or vomiting). 11/14/23  Yes Malachy Mood, MD  Pseudoephedrine-APAP-DM (DAYQUIL PO) Take 1 tablet  by mouth as needed.   Yes [provider]  Multiple Vitamin (MULTIVITAMIN WITH MINERALS) TABS tablet Take 1 tablet by mouth daily. Patient not taking: Reported on 11/24/2023    [provider]  ondansetron (ZOFRAN) 8 MG tablet Take 1 tablet (8 mg total) by mouth every 8 (eight) hours as needed for nausea or vomiting. Patient not taking: Reported on 11/24/2023 03/29/23   Malachy Mood, MD  trifluridine-tipiracil (LONSURF) 20-8.19 MG tablet Take 3 tablets (60 mg of trifluridine total) by mouth 2 (two) times daily after a meal. Take within 1 hr after AM & PM meals on days 1-5, 8-12. Repeat every 28 days. Patient not taking: Reported on 11/24/2023 11/14/23   Malachy Mood, MD  ondansetron (ZOFRAN) 8 MG tablet Take 1 tablet (8 mg total) by mouth every 8 (eight) hours as needed for nausea or vomiting. 09/09/21   Malachy Mood, MD      Allergies    Patient has no known allergies.    Review of Systems   Review of Systems A 10 point review of systems was performed and is negative unless otherwise reported in HPI.  Physical Exam Updated Vital Signs BP (!) 114/58   Pulse (!) 35   Temp 99 F (37.2 C) (Oral)   Resp (!) 22   Ht 6' (1.829 m)   Wt 63.5 kg   SpO2 94%   BMI 19.00 kg/m  Physical Exam General: Chronically ill-appearing thin elderly male, lying in bed.  HEENT: PERRLA, Sclera anicteric, dry mucous membranes,  trachea midline.  Cardiology: Regular tachycardic rate in 200s, no murmurs/rubs/gallops. Resp: Normal respiratory effort, mildly tachypneic. CTAB, no wheezes, rhonchi, crackles.  Abd: Soft, non-tender, non-distended. No rebound tenderness or guarding.  GU: Deferred. MSK: No peripheral edema or signs of trauma. Extremities without deformity or TTP. No cyanosis or clubbing. Skin: warm, dry. Back: No CVA tenderness Neuro: A&Ox1, CNs II-XII grossly intact. MAEs. Sensation grossly intact.  Psych: Disoriented, intermittently crying out for help, not redirectable  ED Results /  Procedures / Treatments   Labs (all labs ordered are listed, but only abnormal results are displayed) Labs Reviewed  RESP PANEL BY RT-PCR (RSV, FLU A&B, COVID)  RVPGX2 - Abnormal; Notable for the following components:      Result Value   Influenza A by PCR POSITIVE (*)    All other components within normal limits  CBC WITH DIFFERENTIAL/PLATELET - Abnormal; Notable for the following components:   WBC 11.5 (*)    RDW 16.3 (*)    Neutro Abs 8.9 (*)    Monocytes Absolute 1.6 (*)    Abs Immature Granulocytes 0.16 (*)    All other components within normal limits  COMPREHENSIVE METABOLIC PANEL - Abnormal; Notable for the following components:   Sodium 128 (*)    Chloride 94 (*)    CO2 21 (*)    Glucose, Bld 128 (*)    Calcium 7.9 (*)    Total Protein 6.2 (*)    Albumin 2.6 (*)    AST 50 (*)    Alkaline Phosphatase 130 (*)    Total Bilirubin 1.3 (*)    All other components within normal limits  URINALYSIS, W/ REFLEX TO CULTURE (INFECTION SUSPECTED) - Abnormal; Notable for the following components:   Color, Urine AMBER (*)    APPearance HAZY (*)    Glucose, UA 50 (*)    Hgb urine dipstick SMALL (*)    Protein, ur >=300 (*)    Bacteria, UA RARE (*)    All other components within normal limits  AMMONIA - Abnormal; Notable for the following components:   Ammonia 59 (*)    All other components within normal limits  PROTIME-INR - Abnormal; Notable for the following components:   Prothrombin Time 17.6 (*)    INR 1.4 (*)    All other components within normal limits  BRAIN NATRIURETIC PEPTIDE - Abnormal; Notable for the following components:   B Natriuretic Peptide 357.7 (*)    All other components within normal limits  CBG MONITORING, ED - Abnormal; Notable for the following components:   Glucose-Capillary 101 (*)    All other components within normal limits  CULTURE, BLOOD (ROUTINE X 2)  CULTURE, BLOOD (ROUTINE X 2)  MAGNESIUM  APTT  TSH  LACTIC ACID, PLASMA  T4, FREE   TROPONIN I (HIGH SENSITIVITY)  TROPONIN I (HIGH SENSITIVITY)    EKG None  Radiology CT Angio Chest PE W and/or Wo Contrast Result Date: 11/24/2023 CLINICAL DATA:  PE suspected. High probability. History of colorectal carcinoma. * Tracking Code: BO * EXAM: CT ANGIOGRAPHY CHEST WITH CONTRAST TECHNIQUE: Multidetector CT imaging of the chest was performed using the standard protocol during bolus administration of intravenous contrast. Multiplanar CT image reconstructions and MIPs were obtained to evaluate the vascular anatomy. RADIATION DOSE REDUCTION: This exam was performed according to the departmental dose-optimization program which includes automated exposure control, adjustment of the mA and/or kV according to patient size and/or use of iterative reconstruction technique. CONTRAST:  75mL OMNIPAQUE IOHEXOL 350 MG/ML  SOLN COMPARISON:  10/09/2023. FINDINGS: Cardiovascular: Satisfactory opacification of the pulmonary arteries to the segmental level. No evidence of pulmonary embolism. Normal heart size. No pericardial effusion. Coronary artery calcifications. Mediastinum/Nodes: Thyroid gland, trachea, and esophagus demonstrate no significant findings. Subcarinal lymph node measures 1.1 cm, image 87/4. This is new from previous exam. Enlarged right hilar lymph node is also new measuring 1.9 cm, image 85/4. Lungs/Pleura: Emphysema. Diffuse bronchial wall thickening. No pleural effusion, airspace consolidation, atelectasis or pneumothorax. Multifocal pulmonary nodules are again noted. Index nodule within the right upper lobe measures 2.4 cm, image 71/12. This is compared with 2.5 cm previously. Index nodule within the right middle lobe measures 2.8 cm, image 96/12. Also unchanged from previous exam. Index nodule within the anterior right apex measures 2 cm, image 39/12. SINCE THE PREVIOUS EXAM THERE IS BEEN INTERVAL DEVELOPMENT OF EXTENSIVE IRREGULAR AND ILL-DEFINED IRREGULAR PERIBRONCHOVASCULAR NODULAR  DENSITIES THROUGHOUT BOTH LOWER LOBES, RIGHT MIDDLE LOBE AND RIGHT UPPER LOBE.  MANY OF THESE APPEAR TO HAVE A CAVITARY COMPONENT.  INDEX NODULES INCLUDE: SINCE THE PREVIOUS EXAM THERE IS BEEN INTERVAL DEVELOPMENT OF EXTENSIVE IRREGULAR AND ILL-DEFINED IRREGULAR PERIBRONCHOVASCULAR NODULAR DENSITIES THROUGHOUT BOTH LOWER LOBES, RIGHT MIDDLE LOBE AND RIGHT UPPER LOBE. MANY OF THESE APPEAR TO HAVE A CAVITARY COMPONENT. INDEX NODULES INCLUDE -nodule within the lateral right lower lobe measures 1.2 cm, image 106/2. -Nodule in the posterior right lower lobe measures 1 cm, image 112/2. -nodule within the right upper lobe measures 0.9 cm, image 56/12. Upper Abdomen: No acute abnormality within the imaged portions of the upper abdomen. Sub optimal visualization of previously noted scattered low-density liver lesions due to phase of contrast opacification. Gallbladder appears distended. Small amount of perihepatic ascites overlies the right hepatic lobe. Musculoskeletal: No chest wall abnormality. No acute or significant osseous findings. Review of the MIP images confirms the above findings. IMPRESSION: 1. No evidence for acute pulmonary embolus. 2. Interval development of extensive irregular and ill-defined irregular peribronchovascular nodular densities throughout both lower lobes, right middle lobe and right upper lobe. Associated diffuse bronchial wall thickening is identified throughout both lungs. Findings are favored to represent atypical infection such as fungal or atypical mycobacterial infection. Metastatic disease is considered less favored. 3. No significant change in the appearance of previously noted pulmonary metastasis. 4. New mediastinal and right hilar lymphadenopathy. In the acute setting this may reflect reactive adenopathy secondary to infection. Nodal metastasis is considered less favored but cannot be excluded with a high degree of certainty. 5. Coronary artery calcifications. 6. Small amount of  perihepatic ascites overlies the right hepatic lobe. 7.  Emphysema (ICD10-J43.9). Electronically Signed   By: Signa Kell M.D.   On: 11/24/2023 14:33   CT Head Wo Contrast Result Date: 11/24/2023 CLINICAL DATA:  Altered mental status. Incontinence and decreased urine output. Intermittent rectal pain. EXAM: CT HEAD WITHOUT CONTRAST TECHNIQUE: Contiguous axial images were obtained from the base of the skull through the vertex without intravenous contrast. RADIATION DOSE REDUCTION: This exam was performed according to the departmental dose-optimization program which includes automated exposure control, adjustment of the mA and/or kV according to patient size and/or use of iterative reconstruction technique. COMPARISON:  None Available. FINDINGS: Brain: Ventricles, cisterns and other CSF spaces are normal. There is no mass, mass effect, shift of midline structures or acute hemorrhage. No evidence of acute infarction. Vascular: No hyperdense vessel or unexpected calcification. Skull: Normal. Negative for fracture or focal lesion. Sinuses/Orbits: Orbits are normal. Mild to moderate chronic inflammatory change of the paranasal sinuses  predominately involving the maxillary and ethmoid sinuses. Other: None. IMPRESSION: 1. No acute intracranial findings. 2. Mild to moderate chronic inflammatory change of the paranasal sinuses. Electronically Signed   By: Elberta Fortis M.D.   On: 11/24/2023 14:26    Procedures .Cardioversion  Date/Time: 11/24/2023 11:44 AM  Performed by: Loetta Rough, MD Authorized by: Loetta Rough, MD   Consent:    Consent obtained:  Verbal and emergent situation   Consent given by: son at bedside.   Risks discussed:  Pain, induced arrhythmia and death   Alternatives discussed:  No treatment Pre-procedure details:    Cardioversion basis:  Emergent   Rhythm:  Ventricular tachycardia   Electrode placement:  Anterior-posterior Patient sedated: No Attempt one:    Cardioversion  mode:  Synchronous   Waveform:  Biphasic   Shock (Joules):  120   Shock outcome:  Conversion to other rhythm Post-procedure details:    Patient status:  Awake   Patient tolerance of procedure:  Tolerated with difficulty .Critical Care  Performed by: Loetta Rough, MD Authorized by: Loetta Rough, MD   Critical care provider statement:    Critical care time (minutes):  45   Critical care was necessary to treat or prevent imminent or life-threatening deterioration of the following conditions:  Circulatory failure, cardiac failure, shock, sepsis and CNS failure or compromise   Critical care was time spent personally by me on the following activities:  Development of treatment plan with patient or surrogate, discussions with consultants, evaluation of patient's response to treatment, examination of patient, ordering and review of laboratory studies, ordering and review of radiographic studies, ordering and performing treatments and interventions, pulse oximetry, re-evaluation of patient's condition, review of old charts and obtaining history from patient or surrogate   Care discussed with: admitting provider       Medications Ordered in ED Medications  piperacillin-tazobactam (ZOSYN) IVPB 3.375 g (3.375 g Intravenous New Bag/Given 11/24/23 1532)  azithromycin (ZITHROMAX) 500 mg in sodium chloride 0.9 % 250 mL IVPB (500 mg Intravenous New Bag/Given 11/24/23 1531)  lactated ringers bolus 1,000 mL (0 mLs Intravenous Stopped 11/24/23 1300)  fentaNYL (SUBLIMAZE) injection 50 mcg (50 mcg Intravenous Given 11/24/23 1130)  iohexol (OMNIPAQUE) 350 MG/ML injection 75 mL (75 mLs Intravenous Contrast Given 11/24/23 1337)  vancomycin (VANCOCIN) IVPB 1000 mg/200 mL premix (1,000 mg Intravenous New Bag/Given 11/24/23 1429)    ED Course/ Medical Decision Making/ A&P                          Medical Decision Making Amount and/or Complexity of Data Reviewed Labs: ordered. Decision-making details documented  in ED Course. Radiology: ordered. Decision-making details documented in ED Course.  Risk Prescription drug management. Decision regarding hospitalization.    This patient presents to the ED for concern of AMS, tachycardia/hypotension, this involves an extensive number of treatment options, and is a complaint that carries with it a high risk of complications and morbidity.  I considered the following differential and admission for this acute, potentially life threatening condition.   MDM:    Patient presents with unstable sustained V. tach on arrival.  Patient is unable to consent.  Discussed with son at bedside who states that cardioversion would indeed be consistent with patient's wishes.  Patient was given 50 mcg IV fentanyl for analgesia and synchronized cardioversion at 120 J was performed with return to an irregular atrial tachycardia, possibly atrial fibrillation, MAT, or sinus tachycardia w/  PACs.  Per son at bedside patient has no history of V. tach.  Consider electrolyte derangements, possible sepsis, anemia. Has known liver mets and will check ammonia. Consider infection such as UTI or PNA. Pt is noted to be satting 88% on RA with tachypnea into 30s. Consider resp infection or possible PE given malignancy. Patient was placed on 2L [place] with improvement. CTAB, no wheezing to indicate COPD, no rales/crackles to indicate HF. Consider thyroid abnormality, toxic cause such as polypharmacy, or possible intracranial cause such as ICH or hydrocephalus. Will get CTH.   Clinical Course as of 11/24/23 1540  Sat Nov 24, 2023  1301 Ammonia(!): 59 Mildly elevated ammonia, could be contributing to AMS. [HN]  1304 Sodium(!): 128 +hyponatremia, receiving fluids. Possibly from decreased PO intake as he has been altered. Not significantly low enough to be causing his AMS. [HN]  1313 Troponin I (High Sensitivity): 11 wnl [HN]  1328 Awaiting CT [HN]  1400 WBC(!): 11.5 +Mild leukocytosis [HN]  1446  Lactic Acid, Venous: 1.9 wnl [HN]  1447 CT Angio Chest PE W and/or Wo Contrast 1. No evidence for acute pulmonary embolus. 2. Interval development of extensive irregular and ill-defined irregular peribronchovascular nodular densities throughout both lower lobes, right middle lobe and right upper lobe. Associated diffuse bronchial wall thickening is identified throughout both lungs. Findings are favored to represent atypical infection such as fungal or atypical mycobacterial infection. Metastatic disease is considered less favored. 3. No significant change in the appearance of previously noted pulmonary metastasis. 4. New mediastinal and right hilar lymphadenopathy. In the acute setting this may reflect reactive adenopathy secondary to infection. Nodal metastasis is considered less favored but cannot be excluded with a high degree of certainty. 5. Coronary artery calcifications. 6. Small amount of perihepatic ascites overlies the right hepatic lobe. 7.  Emphysema (ICD10-J43.9)   [HN]  1447 CT Head Wo Contrast 1. No acute intracranial findings. 2. Mild to moderate chronic inflammatory change of the paranasal sinuses.   [HN]  1447 Started IV azithromycin for atypical coverage. Also noted to be influenza A positive at this time, which contributes to his overall status. Has had no further V tach episodes on telemetry.  Consulted to medicine for admission. [HN]  1508 Influenza A By PCR(!): POSITIVE [HN]  1539 Admitted to medicine. [HN]    Clinical Course User Index [HN] Loetta Rough, MD    Labs: I Ordered, and personally interpreted labs.  The pertinent results include:  those listed above  Imaging Studies ordered: I ordered imaging studies including CT head, CT PE I independently visualized and interpreted imaging. I agree with the radiologist interpretation  Additional history obtained from chart review, son at bedside.   Cardiac Monitoring: The patient was maintained  on a cardiac monitor.  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     Medicines Meds ordered this encounter  Medications   fentaNYL (SUBLIMAZE) 50 MCG/ML injection    Carolin Coy, Gabriela: cabinet override   lactated ringers bolus 1,000 mL   fentaNYL (SUBLIMAZE) injection 50 mcg   iohexol (OMNIPAQUE) 350 MG/ML  injection 75 mL   vancomycin (VANCOCIN) IVPB 1000 mg/200 mL premix    Indication::   Sepsis   piperacillin-tazobactam (ZOSYN) IVPB 3.375 g    Antibiotic Indication::   Sepsis   DISCONTD: cefTRIAXone (ROCEPHIN) 1 g in sodium chloride 0.9 % 100 mL IVPB    Antibiotic Indication::   CAP   azithromycin (ZITHROMAX) 500 mg in sodium chloride 0.9 % 250 mL IVPB    Antibiotic Indication::   CAP    I have reviewed the patients home medicines and have made adjustments as needed  Problem List / ED Course: Problem List Items Addressed This Visit   None Visit Diagnoses       Altered mental status, unspecified altered mental status type    -  Primary     Ventricular tachycardia (HCC)         Hyponatremia         Hyperammonemia (HCC)         Influenza A       Relevant Medications   vancomycin (VANCOCIN) IVPB 1000 mg/200 mL premix (Completed)   azithromycin (ZITHROMAX) 500 mg in sodium chloride 0.9 % 250 mL IVPB     Atypical pneumonia       Relevant Medications   vancomycin (VANCOCIN) IVPB 1000 mg/200 mL premix (Completed)   piperacillin-tazobactam (ZOSYN) IVPB 3.375 g   azithromycin (ZITHROMAX) 500 mg in sodium chloride 0.9  % 250 mL IVPB   Pseudoephedrine-APAP-DM (DAYQUIL PO)                   This note was created using dictation software, which may contain spelling or grammatical errors.    Loetta Rough, MD 11/24/23 [phone]

## 2023-11-24 NOTE — Assessment & Plan Note (Signed)
Check serum osm. Clincially this is chronic asymptomtiac moderate with likely cause being prerenal. Check urine sodium. No objection to fluids administration for sepsis

## 2023-11-25 ENCOUNTER — Inpatient Hospital Stay (HOSPITAL_COMMUNITY): Payer: Medicare Other

## 2023-11-25 DIAGNOSIS — J1000 Influenza due to other identified influenza virus with unspecified type of pneumonia: Secondary | ICD-10-CM | POA: Diagnosis not present

## 2023-11-25 DIAGNOSIS — R000 Tachycardia, unspecified: Secondary | ICD-10-CM | POA: Diagnosis not present

## 2023-11-25 DIAGNOSIS — I10 Essential (primary) hypertension: Secondary | ICD-10-CM

## 2023-11-25 DIAGNOSIS — I472 Ventricular tachycardia, unspecified: Secondary | ICD-10-CM | POA: Diagnosis not present

## 2023-11-25 LAB — ECHOCARDIOGRAM COMPLETE
AR max vel: 3.07 cm2
AV Area VTI: 3.1 cm2
AV Area mean vel: 2.66 cm2
AV Mean grad: 4 mm[Hg]
AV Peak grad: 7 mm[Hg]
Ao pk vel: 1.32 m/s
Area-P 1/2: 2.95 cm2
Calc EF: 60.2 %
Height: 72 in
MV VTI: 3.02 cm2
S' Lateral: 3.5 cm
Single Plane A2C EF: 61.7 %
Single Plane A4C EF: 65.5 %
Weight: 2109.36 [oz_av]

## 2023-11-25 LAB — PROCALCITONIN: Procalcitonin: 0.42 ng/mL

## 2023-11-25 LAB — HEPATIC FUNCTION PANEL
ALT: 27 U/L (ref 0–44)
AST: 34 U/L (ref 15–41)
Albumin: 2.4 g/dL — ABNORMAL LOW (ref 3.5–5.0)
Alkaline Phosphatase: 98 U/L (ref 38–126)
Bilirubin, Direct: 0.4 mg/dL — ABNORMAL HIGH (ref 0.0–0.2)
Indirect Bilirubin: 0.9 mg/dL (ref 0.3–0.9)
Total Bilirubin: 1.3 mg/dL — ABNORMAL HIGH (ref 0.0–1.2)
Total Protein: 6.1 g/dL — ABNORMAL LOW (ref 6.5–8.1)

## 2023-11-25 LAB — CBC
HCT: 36.5 % — ABNORMAL LOW (ref 39.0–52.0)
Hemoglobin: 11.7 g/dL — ABNORMAL LOW (ref 13.0–17.0)
MCH: 28.1 pg (ref 26.0–34.0)
MCHC: 32.1 g/dL (ref 30.0–36.0)
MCV: 87.7 fL (ref 80.0–100.0)
Platelets: 282 10*3/uL (ref 150–400)
RBC: 4.16 MIL/uL — ABNORMAL LOW (ref 4.22–5.81)
RDW: 16.2 % — ABNORMAL HIGH (ref 11.5–15.5)
WBC: 5.9 10*3/uL (ref 4.0–10.5)
nRBC: 0 % (ref 0.0–0.2)

## 2023-11-25 LAB — BASIC METABOLIC PANEL
Anion gap: 10 (ref 5–15)
BUN: 12 mg/dL (ref 8–23)
CO2: 26 mmol/L (ref 22–32)
Calcium: 8.1 mg/dL — ABNORMAL LOW (ref 8.9–10.3)
Chloride: 96 mmol/L — ABNORMAL LOW (ref 98–111)
Creatinine, Ser: 0.93 mg/dL (ref 0.61–1.24)
GFR, Estimated: >60 mL/min (ref >60)
Glucose, Bld: 94 mg/dL (ref 70–99)
Potassium: 3.5 mmol/L (ref 3.5–5.1)
Sodium: 132 mmol/L — ABNORMAL LOW (ref 135–145)

## 2023-11-25 LAB — PROTIME-INR
INR: 1.5 — ABNORMAL HIGH (ref 0.8–1.2)
Prothrombin Time: 18.1 s — ABNORMAL HIGH (ref 11.4–15.2)

## 2023-11-25 LAB — APTT: aPTT: 33 s (ref 24–36)

## 2023-11-25 LAB — OSMOLALITY: Osmolality: 278 mosm/kg (ref 275–295)

## 2023-11-25 LAB — BRAIN NATRIURETIC PEPTIDE: B Natriuretic Peptide: 151.7 pg/mL — ABNORMAL HIGH (ref 0.0–100.0)

## 2023-11-25 LAB — VITAMIN B12: Vitamin B-12: 3003 pg/mL — ABNORMAL HIGH (ref 180–914)

## 2023-11-25 MED ORDER — LACTULOSE 10 GM/15ML PO SOLN
30.0000 g | Freq: Three times a day (TID) | ORAL | Status: DC
  Administered 2023-11-25 – 2023-11-26 (×3): 30 g via ORAL
  Filled 2023-11-25 (×3): qty 45

## 2023-11-25 MED ORDER — ONDANSETRON HCL 4 MG/2ML IJ SOLN: 4.0000 mg | Freq: Four times a day (QID) | INTRAMUSCULAR | Status: DC | PRN

## 2023-11-25 MED ORDER — IPRATROPIUM-ALBUTEROL 0.5-2.5 (3) MG/3ML IN SOLN: 3.0000 mL | RESPIRATORY_TRACT | Status: DC | PRN

## 2023-11-25 MED ORDER — METOPROLOL TARTRATE 25 MG PO TABS
25.0000 mg | ORAL_TABLET | Freq: Two times a day (BID) | ORAL | Status: DC
  Administered 2023-11-25 – 2023-11-26 (×3): 25 mg via ORAL
  Filled 2023-11-25 (×3): qty 1

## 2023-11-25 MED ORDER — METOPROLOL TARTRATE 5 MG/5ML IV SOLN: 5.0000 mg | INTRAVENOUS | Status: DC | PRN

## 2023-11-25 MED ORDER — DOXYCYCLINE HYCLATE 100 MG PO TABS
100.0000 mg | ORAL_TABLET | Freq: Two times a day (BID) | ORAL | Status: DC
  Administered 2023-11-25 – 2023-11-26 (×2): 100 mg via ORAL
  Filled 2023-11-25 (×2): qty 1

## 2023-11-25 MED ORDER — TRAZODONE HCL 50 MG PO TABS
50.0000 mg | ORAL_TABLET | Freq: Every evening | ORAL | Status: DC | PRN
  Administered 2023-11-26: 50 mg via ORAL
  Filled 2023-11-25: qty 1

## 2023-11-25 MED ORDER — HYDRALAZINE HCL 20 MG/ML IJ SOLN
10.0000 mg | INTRAMUSCULAR | Status: DC | PRN
Start: 2023-11-25 — End: 2023-11-27

## 2023-11-25 MED ORDER — AMLODIPINE BESYLATE 5 MG PO TABS
5.0000 mg | ORAL_TABLET | Freq: Every day | ORAL | Status: DC
  Administered 2023-11-25 – 2023-11-26 (×2): 5 mg via ORAL
  Filled 2023-11-25 (×2): qty 1

## 2023-11-25 MED ORDER — GLUCAGON HCL RDNA (DIAGNOSTIC) 1 MG IJ SOLR: 1.0000 mg | INTRAMUSCULAR | Status: DC | PRN

## 2023-11-25 MED ORDER — GUAIFENESIN 100 MG/5ML PO LIQD: 5.0000 mL | ORAL | Status: DC | PRN

## 2023-11-25 MED ORDER — GABAPENTIN 100 MG PO CAPS
200.0000 mg | ORAL_CAPSULE | Freq: Three times a day (TID) | ORAL | Status: DC
  Administered 2023-11-25 – 2023-11-26 (×4): 200 mg via ORAL
  Filled 2023-11-25 (×4): qty 2

## 2023-11-25 MED ORDER — SENNOSIDES-DOCUSATE SODIUM 8.6-50 MG PO TABS: 1.0000 | ORAL_TABLET | Freq: Every evening | ORAL | Status: DC | PRN

## 2023-11-25 MED ORDER — DIPHENOXYLATE-ATROPINE 2.5-0.025 MG PO TABS
1.0000 | ORAL_TABLET | Freq: Four times a day (QID) | ORAL | Status: DC | PRN
  Administered 2023-11-26: 2 via ORAL
  Filled 2023-11-25: qty 2

## 2023-11-25 NOTE — Progress Notes (Signed)
Cardiology Consultation   Patient ID: RYYAN AREDONDO MRN: [id number]; DOB: 10-31-1951  Admit date: 11/24/2023 Date of Consult: 11/25/2023  PCP:  Oneita Hurt, No   [facility] Providers Cardiologist:  None        Patient Profile:   Jorge [name] is a 73 y.o. 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 Nelson Chimes.  History of Present Illness:   Jorge [name] presented with altered mental status yesterday.  He had a prodrome of generalized bodyaches, abdominal pain, loose bowel movements preceding his becoming incoherent.  He had not had any clear fever or chills.  On arrival to the emergency room he was found to have wide-complex tachycardia at 220 bpm and became hypotensive.  He underwent urgent cardioversion.  He continued to have altered mental status for most of the day yesterday but gradually improved as the day went by..  Morning he is much more alert.  He denies having any problems with chest discomfort or shortness of breath and was not aware of palpitations.  He does not remember yesterday.  He has a history of treated hypertension on amlodipine.  He does not have known CAD, PAD, atrial fibrillation, previous arrhythmia, congestive heart failure.  The ECG tracing yesterday does show wide-complex tachycardia, but the QRS complex is quite narrow at less than 130 ms and has a relatively brisk initial intrinsicoid deflection.  Other than frequent ectopic beats, the follow-up ECG normal sinus rhythm is a normal tracing.  The echocardiogram performed today does not show any significant structural abnormalities.  He has normal LV size, wall motion and overall EF and even diastolic parameters are normal.  He had a CT angiogram of the chest that did not show evidence of pulmonary embolism but did show multiple bilateral cavitary lung lesions (favored to represent fungal or  atypical mycobacterial infection, less likely metastasis.  Coronary calcifications were seen as well as emphysema.  On my review of the images, I would describe the coronary calcifications as mild and less than average for a man his age.  Although the study was not gated, the coronary arteries are fairly well-opacified and at least the proximal LAD and proximal circumflex coronary do not appear to have any severe stenotic lesions.     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    Past Surgical History:  Procedure Laterality Date   BRONCHIAL BIOPSY  09/27/2021   Procedure: BRONCHIAL BIOPSIES;  Surgeon: Josephine Igo, DO;  Location: [facility];  Service: Pulmonary;;   BRONCHIAL BRUSHINGS  09/27/2021   Procedure: BRONCHIAL BRUSHINGS;  Surgeon: Josephine Igo, DO;  Location: [facility];  Service: Pulmonary;;   BRONCHIAL NEEDLE ASPIRATION BIOPSY  09/27/2021   Procedure: BRONCHIAL NEEDLE ASPIRATION BIOPSIES;  Surgeon: Josephine Igo, DO;  Location: [facility];  Service: Pulmonary;;   COLONOSCOPY     IR IMAGING GUIDED PORT INSERTION  10/24/2021   left breast surgery Left 1968   VIDEO BRONCHOSCOPY WITH RADIAL ENDOBRONCHIAL ULTRASOUND  09/27/2021   Procedure: RADIAL ENDOBRONCHIAL ULTRASOUND;  Surgeon: Josephine Igo, DO;  Location: [facility];  Service: Pulmonary;;       Inpatient Medications: Scheduled Meds:  amLODipine  5 mg Oral Daily   Chlorhexidine Gluconate Cloth  6 each Topical Daily   enoxaparin (LOVENOX) injection  40 mg Subcutaneous  Q24H   gabapentin  200 mg Oral TID   lactulose  30 g Oral TID   oseltamivir  75 mg Oral BID   pantoprazole (PROTONIX) IV  40 mg Intravenous Q24H   rifaximin  550 mg Oral BID   simethicone  80 mg Oral Once   Continuous Infusions:  doxycycline (VIBRAMYCIN) IV Stopped (11/25/23 0726)   piperacillin-tazobactam (ZOSYN)  IV Stopped (11/25/23 4696)    PRN Meds: acetaminophen **OR** acetaminophen, diphenoxylate-atropine, glucagon (human recombinant), guaiFENesin, hydrALAZINE, ipratropium-albuterol, metoprolol tartrate, ondansetron (ZOFRAN) IV, mouth rinse, oxyCODONE, polyethylene glycol, prochlorperazine, senna-docusate  Allergies:   No Known Allergies  Social History:   Social History   Socioeconomic History   Marital status: Widowed    Spouse name: Not on file   Number of children: 5   Years of education: Not on file   Highest education level: Not on file  Occupational History   Not on file  Tobacco Use   Smoking status: Some Days    Current packs/day: 0.25    Average packs/day: 0.3 packs/day for 20.0 years (5.0 ttl pk-yrs)    Types: Cigarettes   Smokeless tobacco: Never  Vaping Use   Vaping status: Never Used  Substance and Sexual Activity   Alcohol use: Not Currently    Comment: 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, stopped in 02/2021   Drug use: Never   Sexual activity: Not on file  Other Topics Concern   Not on file  Social History Narrative   Not on file   Social Drivers of Health   Financial Resource Strain: Not on file  Food Insecurity: No Food Insecurity (11/24/2023)   Hunger Vital Sign    Worried About Running Out of Food in the Last Year: Never true    Ran Out of Food in the Last Year: Never true  Transportation Needs: No Transportation Needs (11/24/2023)   PRAPARE - Administrator, Civil Service (Medical): No    Lack of Transportation (Non-Medical): No  Physical Activity: Not on file  Stress: Not on file  Social Connections: Socially Isolated (11/24/2023)   Social Connection and Isolation Panel [NHANES]    Frequency of Communication with Friends and Family: More than three times a week    Frequency of Social Gatherings with Friends and Family: Twice a week    Attends Religious Services: Never    Database administrator or Organizations: No    Attends Banker Meetings:  Never    Marital Status: Widowed  Intimate Partner Violence: Not At Risk (11/24/2023)   Humiliation, Afraid, Rape, and Kick questionnaire    Fear of Current or Ex-Partner: No    Emotionally Abused: No    Physically Abused: No    Sexually Abused: No    Family History:    Family History  Problem Relation Age of Onset   Cancer Mother 87       unknown type cancer   Colon cancer Neg Hx    Esophageal cancer Neg Hx    Rectal cancer Neg Hx    Stomach cancer Neg Hx    Inflammatory bowel disease Neg Hx    Liver disease Neg Hx    Pancreatic cancer Neg Hx      ROS:  Please see the history of present illness.   All other ROS reviewed and negative.     Physical Exam/Data:   Vitals:   11/25/23 0500 11/25/23 0600 11/25/23 0700 11/25/23 0827  BP: (!) 151/71 120/68 Marland Kitchen)  144/68   Pulse: 75 66 72   Resp: (!) 26 (!) 22 (!) 25   Temp:    99.2 F (37.3 C)  TempSrc:    Oral  SpO2: 97% 97% 99%   Weight:      Height:        Intake/Output Summary (Last 24 hours) at 11/25/2023 0857 Last data filed at 11/25/2023 0700 Gross per 24 hour  Intake 1001.73 ml  Output 350 ml  Net 651.73 ml      11/24/2023    5:00 PM 11/24/2023   11:20 AM 11/14/2023   10:15 AM  Last 3 Weights  Weight (lbs) 131 lb 13.4 oz 140 lb 1.6 oz 140 lb 1.6 oz  Weight (kg) 59.8 kg 63.549 kg 63.549 kg     Body mass index is 17.88 kg/m.  General:  Well nourished, well developed, in no acute distress. Very thi/cachectic HEENT: normal Neck: no JVD Vascular: No carotid bruits; Distal pulses 2+ bilaterally Cardiac:  normal S1, S2; RRR; no murmur  Lungs:  clear to auscultation bilaterally, no wheezing, rhonchi or rales  Abd: soft, nontender, no hepatomegaly  Ext: no edema Musculoskeletal:  No deformities, BUE and BLE strength normal and equal Skin: warm and dry  Neuro:  CNs 2-12 intact, no focal abnormalities noted Psych:  Normal affect   EKG:   -The denting EKG was personally reviewed and demonstrates: Wide-complex  tachycardia at about 220 bpm with a QRS duration 126 ms, QRS morphology generally approximating atypical left bundle branch block.  Mostly suggests SVT with aberrant conduction, with the exception of possible initial R wave in lead aVR - Follow-up ECG in normal sinus rhythm shows frequent PACs, with some of them aberrantly conducted.  R wave progression is somewhat delayed across the anterior precordium but otherwise this is a normal thing.  QTc 441 ms  Telemetry:  Telemetry was personally reviewed and demonstrates: Sinus rhythm with very frequent PACs, some of them with aberrant conduction.  PVCs are also seen.  Relevant CV Studies: Echocardiogram performed today is essentially a normal study.  Laboratory Data:  High Sensitivity Troponin:   Recent Labs  Lab 11/24/23 1140 11/24/23 1343  TROPONINIHS 11 16     Chemistry Recent Labs  Lab 11/24/23 1124 11/24/23 1140  NA 128*  --   K 3.9  --   CL 94*  --   CO2 21*  --   GLUCOSE 128*  --   BUN 9  --   CREATININE 0.78  --   CALCIUM 7.9*  --   MG  --  2.4  GFRNONAA >60  --   ANIONGAP 13  --     Recent Labs  Lab 11/24/23 1124  PROT 6.2*  ALBUMIN 2.6*  AST 50*  ALT 33  ALKPHOS 130*  BILITOT 1.3*   Lipids No results for input(s): "CHOL", "TRIG", "HDL", "LABVLDL", "LDLCALC", "CHOLHDL" in the last 168 hours.  Hematology Recent Labs  Lab 11/24/23 1124  WBC 11.5*  RBC 4.74  HGB 13.4  HCT 41.8  MCV 88.2  MCH 28.3  MCHC 32.1  RDW 16.3*  PLT 329   Thyroid  Recent Labs  Lab 11/24/23 1143  TSH 2.612  FREET4 0.99    BNP Recent Labs  Lab 11/24/23 1149  BNP 357.7*    DDimer No results for input(s): "DDIMER" in the last 168 hours.   Radiology/Studies:  ECHOCARDIOGRAM COMPLETE Result Date: 11/25/2023    ECHOCARDIOGRAM REPORT   Patient Name:   [facility]  Rosaura Carpenter Date of Exam: 11/25/2023 Medical Rec #:  [id number]        Height:       72.0 in Accession #:    [id number]       Weight:       131.8 lb Date of Birth:   Nov 25, 1950        BSA:          1.785 m Patient Age:    72 years         BP:           144/68 mmHg Patient Gender: M                HR:           73 bpm. Exam Location:  Inpatient Procedure: 2D Echo, Cardiac Doppler and Color Doppler Indications:    Ventricular tachycardia  History:        Patient has no prior history of Echocardiogram examinations.  Sonographer:    Vern Claude Referring Phys: [id number] HERSH GOEL IMPRESSIONS  1. Left ventricular ejection fraction, by estimation, is 60 to 65%. The left ventricle has normal function. The left ventricle has no regional wall motion abnormalities. Left ventricular diastolic parameters were normal.  2. Right ventricular systolic function is normal. The right ventricular size is normal. There is normal pulmonary artery systolic pressure. The estimated right ventricular systolic pressure is 27.8 mmHg.  3. The mitral valve is normal in structure. No evidence of mitral valve regurgitation. No evidence of mitral stenosis.  4. The aortic valve is grossly normal. Aortic valve regurgitation is not visualized. No aortic stenosis is present.  5. The inferior vena cava is normal in size with greater than 50% respiratory variability, suggesting right atrial pressure of 3 mmHg. FINDINGS  Left Ventricle: Left ventricular ejection fraction, by estimation, is 60 to 65%. The left ventricle has normal function. The left ventricle has no regional wall motion abnormalities. The left ventricular internal cavity size was normal in size. There is  no left ventricular hypertrophy. Left ventricular diastolic parameters were normal. Right Ventricle: The right ventricular size is normal. No increase in right ventricular wall thickness. Right ventricular systolic function is normal. There is normal pulmonary artery systolic pressure. The tricuspid regurgitant velocity is 2.49 m/s, and  with an assumed right atrial pressure of 3 mmHg, the estimated right ventricular systolic pressure is 27.8 mmHg.  Left Atrium: Left atrial size was normal in size. Right Atrium: Right atrial size was normal in size. Pericardium: There is no evidence of pericardial effusion. Mitral Valve: The mitral valve is normal in structure. No evidence of mitral valve regurgitation. No evidence of mitral valve stenosis. MV peak gradient, 3.3 mmHg. The mean mitral valve gradient is 1.0 mmHg. Tricuspid Valve: The tricuspid valve is normal in structure. Tricuspid valve regurgitation is mild . No evidence of tricuspid stenosis. Aortic Valve: The aortic valve is grossly normal. Aortic valve regurgitation is not visualized. No aortic stenosis is present. Aortic valve mean gradient measures 4.0 mmHg. Aortic valve peak gradient measures 7.0 mmHg. Aortic valve area, by VTI measures 3.10 cm. Pulmonic Valve: The pulmonic valve was normal in structure. Pulmonic valve regurgitation is not visualized. No evidence of pulmonic stenosis. Aorta: The aortic root is normal in size and structure. Venous: The inferior vena cava is normal in size with greater than 50% respiratory variability, suggesting right atrial pressure of 3 mmHg. IAS/Shunts: No atrial level shunt detected by color flow Doppler.  LEFT VENTRICLE PLAX  2D LVIDd:         5.20 cm      Diastology LVIDs:         3.50 cm      LV e' medial:    5.98 cm/s LV PW:         0.70 cm      LV E/e' medial:  13.0 LV IVS:        0.70 cm      LV e' lateral:   10.10 cm/s LVOT diam:     2.10 cm      LV E/e' lateral: 7.7 LV SV:         74 LV SV Index:   42 LVOT Area:     3.46 cm  LV Volumes (MOD) LV vol d, MOD A2C: 97.3 ml LV vol d, MOD A4C: 148.0 ml LV vol s, MOD A2C: 37.3 ml LV vol s, MOD A4C: 51.1 ml LV SV MOD A2C:     60.0 ml LV SV MOD A4C:     148.0 ml LV SV MOD BP:      79.4 ml RIGHT VENTRICLE             IVC RV Basal diam:  3.20 cm     IVC diam: 1.30 cm RV Mid diam:    3.10 cm RV S prime:     13.10 cm/s TAPSE (M-mode): 1.9 cm LEFT ATRIUM             Index        RIGHT ATRIUM          Index LA diam:         2.50 cm 1.40 cm/m   RA Area:     7.31 cm LA Vol (A2C):   16.3 ml 9.13 ml/m   RA Volume:   13.40 ml 7.51 ml/m LA Vol (A4C):   37.4 ml 20.96 ml/m LA Biplane Vol: 24.6 ml 13.78 ml/m  AORTIC VALVE                    PULMONIC VALVE AV Area (Vmax):    3.07 cm     PV Vmax:       0.66 m/s AV Area (Vmean):   2.66 cm     PV Peak grad:  1.7 mmHg AV Area (VTI):     3.10 cm AV Vmax:           132.00 cm/s AV Vmean:          89.500 cm/s AV VTI:            0.240 m AV Peak Grad:      7.0 mmHg AV Mean Grad:      4.0 mmHg LVOT Vmax:         117.00 cm/s LVOT Vmean:        68.800 cm/s LVOT VTI:          0.215 m LVOT/AV VTI ratio: 0.90  AORTA Ao Root diam: 3.10 cm Ao Asc diam:  3.10 cm MITRAL VALVE               TRICUSPID VALVE MV Area (PHT): 2.95 cm    TR Peak grad:   24.8 mmHg MV Area VTI:   3.02 cm    TR Vmax:        249.00 cm/s MV Peak grad:  3.3 mmHg MV Mean grad:  1.0 mmHg    SHUNTS MV Vmax:  0.90 m/s    Systemic VTI:  0.22 m MV Vmean:      56.5 cm/s   Systemic Diam: 2.10 cm MV Decel Time: 257 msec MV E velocity: 77.90 cm/s MV A velocity: 86.80 cm/s MV E/A ratio:  0.90 Aquarius Tremper MD Electronically signed by Thurmon Fair MD Signature Date/Time: 11/25/2023/8:51:57 AM    Final    DG Abd 1 View Result Date: 11/24/2023 CLINICAL DATA:  Altered mental status, rectal pain. EXAM: ABDOMEN - 1 VIEW COMPARISON:  CT abdomen pelvis dated 10/09/2023. FINDINGS: The bowel gas pattern is normal. No calculi overlying the expected course of the urinary tract. Degenerative changes are seen in the spine and hips. Excreted contrast is seen in the bladder. IMPRESSION: Nonobstructive bowel gas pattern. Electronically Signed   By: Romona Curls M.D.   On: 11/24/2023 16:21   CT Angio Chest PE W and/or Wo Contrast Result Date: 11/24/2023 CLINICAL DATA:  PE suspected. High probability. History of colorectal carcinoma. * Tracking Code: BO * EXAM: CT ANGIOGRAPHY CHEST WITH CONTRAST TECHNIQUE: Multidetector CT imaging of the chest was  performed using the standard protocol during bolus administration of intravenous contrast. Multiplanar CT image reconstructions and MIPs were obtained to evaluate the vascular anatomy. RADIATION DOSE REDUCTION: This exam was performed according to the departmental dose-optimization program which includes automated exposure control, adjustment of the mA and/or kV according to patient size and/or use of iterative reconstruction technique. CONTRAST:  75mL OMNIPAQUE IOHEXOL 350 MG/ML SOLN COMPARISON:  10/09/2023. FINDINGS: Cardiovascular: Satisfactory opacification of the pulmonary arteries to the segmental level. No evidence of pulmonary embolism. Normal heart size. No pericardial effusion. Coronary artery calcifications. Mediastinum/Nodes: Thyroid gland, trachea, and esophagus demonstrate no significant findings. Subcarinal lymph node measures 1.1 cm, image 87/4. This is new from previous exam. Enlarged right hilar lymph node is also new measuring 1.9 cm, image 85/4. Lungs/Pleura: Emphysema. Diffuse bronchial wall thickening. No pleural effusion, airspace consolidation, atelectasis or pneumothorax. Multifocal pulmonary nodules are again noted. Index nodule within the right upper lobe measures 2.4 cm, image 71/12. This is compared with 2.5 cm previously. Index nodule within the right middle lobe measures 2.8 cm, image 96/12. Also unchanged from previous exam. Index nodule within the anterior right apex measures 2 cm, image 39/12. SINCE THE PREVIOUS EXAM THERE IS BEEN INTERVAL DEVELOPMENT OF EXTENSIVE IRREGULAR AND ILL-DEFINED IRREGULAR PERIBRONCHOVASCULAR NODULAR DENSITIES THROUGHOUT BOTH LOWER LOBES, RIGHT MIDDLE LOBE AND RIGHT UPPER LOBE.  MANY OF THESE APPEAR TO HAVE A CAVITARY COMPONENT.  INDEX NODULES INCLUDE: SINCE THE PREVIOUS EXAM THERE IS BEEN INTERVAL DEVELOPMENT OF EXTENSIVE IRREGULAR AND ILL-DEFINED IRREGULAR PERIBRONCHOVASCULAR NODULAR DENSITIES THROUGHOUT BOTH LOWER LOBES, RIGHT MIDDLE LOBE AND RIGHT  UPPER LOBE. MANY OF THESE APPEAR TO HAVE A CAVITARY COMPONENT. INDEX NODULES INCLUDE -nodule within the lateral right lower lobe measures 1.2 cm, image 106/2. -Nodule in the posterior right lower lobe measures 1 cm, image 112/2. -nodule within the right upper lobe measures 0.9 cm, image 56/12. Upper Abdomen: No acute abnormality within the imaged portions of the upper abdomen. Sub optimal visualization of previously noted scattered low-density liver lesions due to phase of contrast opacification. Gallbladder appears distended. Small amount of perihepatic ascites overlies the right hepatic lobe. Musculoskeletal: No chest wall abnormality. No acute or significant osseous findings. Review of the MIP images confirms the above findings. IMPRESSION: 1. No evidence for acute pulmonary embolus. 2. Interval development of extensive irregular and ill-defined irregular peribronchovascular nodular densities throughout both lower lobes, right middle lobe and right upper  lobe. Associated diffuse bronchial wall thickening is identified throughout both lungs. Findings are favored to represent atypical infection such as fungal or atypical mycobacterial infection. Metastatic disease is considered less favored. 3. No significant change in the appearance of previously noted pulmonary metastasis. 4. New mediastinal and right hilar lymphadenopathy. In the acute setting this may reflect reactive adenopathy secondary to infection. Nodal metastasis is considered less favored but cannot be excluded with a high degree of certainty. 5. Coronary artery calcifications. 6. Small amount of perihepatic ascites overlies the right hepatic lobe. 7.  Emphysema (ICD10-J43.9). Electronically Signed   By: Signa Kell M.D.   On: 11/24/2023 14:33   CT Head Wo Contrast Result Date: 11/24/2023 CLINICAL DATA:  Altered mental status. Incontinence and decreased urine output. Intermittent rectal pain. EXAM: CT HEAD WITHOUT CONTRAST TECHNIQUE: Contiguous  axial images were obtained from the base of the skull through the vertex without intravenous contrast. RADIATION DOSE REDUCTION: This exam was performed according to the departmental dose-optimization program which includes automated exposure control, adjustment of the mA and/or kV according to patient size and/or use of iterative reconstruction technique. COMPARISON:  None Available. FINDINGS: Brain: Ventricles, cisterns and other CSF spaces are normal. There is no mass, mass effect, shift of midline structures or acute hemorrhage. No evidence of acute infarction. Vascular: No hyperdense vessel or unexpected calcification. Skull: Normal. Negative for fracture or focal lesion. Sinuses/Orbits: Orbits are normal. Mild to moderate chronic inflammatory change of the paranasal sinuses predominately involving the maxillary and ethmoid sinuses. Other: None. IMPRESSION: 1. No acute intracranial findings. 2. Mild to moderate chronic inflammatory change of the paranasal sinuses. Electronically Signed   By: Elberta Fortis M.D.   On: 11/24/2023 14:26     Assessment and Plan:   Wide QRS Complex tachycardia: Hard to make a firm differential diagnosis of atrial versus ventricular origin, especially due to the prominent motion artifact on the tracings.  The presenting ECG tracing does show wide-complex tachycardia, but the QRS complex is quite narrow at less than 130 ms and has a relatively brisk initial intrinsicoid deflection.  There is no evidence of positive or negative concordance.  The tracing is poor due to a lot of motion artifact but there does appear to be an initial R wave in lead aVR which is the only indicator that this is ventricular tachycardia.  I think it is more likely that this is supraventricular tachycardia with aberrant conduction.  If the arrhythmia is of ventricular origin, it must originate very close to the conduction system for the QRS to be so narrow.  His echocardiogram does not show any structural  abnormalities and he does not have history of heart disease.  The arrhythmia occurred in the setting of noncardiac illness.  Will add a low-dose of beta-blocker today.   Coronary artery calcifications: These are relatively mild for a male smoker his age.  High-sensitivity troponin is normal.  His BNP is marginally increased.  It is reasonable to perform some type of ischemic workup, but unless he has arrhythmia recurrence, it is okay to do this as an outpatient (coronary CT angiography). HTN: If necessary can reduce the dose of amlodipine to accommodate the beta-blocker. Hyponatremia: I think this is most likely due to hypovolemia.  It is improving. Cavitary lung lesions: Favored by radiology to represent atypical mycobacterial or fungal infection rather than metastasis. Metastatic rectal cancer on chemotherapy: He recently had a large gathering with his grandchildren and at least one of them had a respiratory illness.  Since he is immunosuppressed he is vulnerable.  So far no respiratory agent has been identified.   Risk Assessment/Risk Scores:                For questions or updates, please contact  [facility] Please consult [email or web address] for contact info under    Signed, Thurmon Fair, MD  11/25/2023 8:57 AM

## 2023-11-25 NOTE — Progress Notes (Signed)
  Echocardiogram 2D Echocardiogram has been performed.  Ocie Doyne RDCS 11/25/2023, 8:11 AM

## 2023-11-25 NOTE — Hospital Course (Addendum)
Brief Narrative:  73 year old with history of significant rectal metastatic cancer and chemotherapy brought to the hospital for change in mental status.  Apparently patient was in normal state of health per family about 4 days ago but since then has reported of abdominal pain and noted to be confused.  Upon admission he was noted to be tachycardic and hypotensive.  EKG had showed wide-complex tachycardia therefore cardioversion was performed by the ER provider. Over the course of 3 days patient did much better, encephalopathy had resolved, was seen by cardiology and metoprolol twice daily was added.  From influenza pneumonia standpoint he was doing significantly well, his antibiotics was de-escalated to doxycycline only to treat any superimposed bacterial infection.  Will plan to complete his 5 days of Tamiflu.  PT/OT has recommended SNF, TOC has been consulted.  Due to his chronic diarrhea he had some electrolyte imbalance which was repleted. Today patient is doing much better, declining SNF therefore arrangements for home health services are being made.  Discussed with patient's daughter as well.  Assessment & Plan:  Principal Problem:   Pneumonia Active Problems:   Rectal cancer (HCC)   Acute encephalopathy   Abdominal pain   CAP (community acquired pneumonia)   Hyponatremia   Ventricular tachycardia (HCC)   Ventricular tachycardia (HCC) Wide-complex ventricular tachycardia status post cardioversion in the ER.  TSH normal, seen by cardiology.  Added metoprolol 25 mg twice daily. - Echocardiogram - ef 65%  Acute toxic/hepatic encephalopathy, resolved -CT of the head is negative.  Ammonia level elevated at 59.  Continue rifaximin and ammonia.  Titrate about 3 bowel movements daily.  TSH, B12 normal   Hyponatremia, improved Resolved.    Influenza A pneumonia, improved CTA chest is suggestive of atypical infection.  Respiratory panel is positive for influenza A pneumonia.  Tamiflu total 5  days, bronchodilators, I-S/flutter valve.  Transition antibiotics to doxycycline (EOT 1/22).  Discontinue Zosyn.   Abdominal pain Appears to have resolved.  Abdominal x-ray is negative for any acute pathology.  Will continue to closely monitor this.   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 Mosetta Putt for chemotherapy, she is aware of patient's admission Follows outpatient palliative for pain control  Essential hypertension -Continue home meds.  IV as needed  Mild hypocalcemia/hypokalemia - As needed repletion  PT/OT = SNF. TOC consulted.    DVT prophylaxis: Lovenox    Code Status: Full Code Family Communication:  Called [name] Status is: Inpatient Remains inpatient appropriate because: SNF placement.    Subjective: Doing ok. Doesn't want SNF  Examination:  General exam: Appears calm and comfortable ; chronically ill Respiratory system: Clear to auscultation. Respiratory effort normal. Cardiovascular system: S1 & S2 heard, RRR. No JVD, murmurs, rubs, gallops or clicks. No pedal edema. Gastrointestinal system: Abdomen is nondistended, soft and nontender. No organomegaly or masses felt. Normal bowel sounds heard. Central nervous system: Alert and oriented. No focal neurological deficits. Extremities: Symmetric 5 x 5 power. Skin: No rashes, lesions or ulcers Psychiatry: Judgement and insight appear normal. Mood & affect appropriate.

## 2023-11-25 NOTE — Plan of Care (Signed)
  Problem: Clinical Measurements: Goal: Ability to maintain clinical measurements within normal limits will improve Outcome: Progressing Goal: Will remain free from infection Outcome: Progressing Goal: Diagnostic test results will improve Outcome: Progressing Goal: Cardiovascular complication will be avoided Outcome: Progressing   Problem: Activity: Goal: Risk for activity intolerance will decrease Outcome: Progressing   Problem: Elimination: Goal: Will not experience complications related to bowel motility Outcome: Progressing Goal: Will not experience complications related to urinary retention Outcome: Progressing

## 2023-11-25 NOTE — Progress Notes (Addendum)
PROGRESS NOTE    Jorge [name]  ION:[id number] DOB: 14-May-1951 DOA: 11/24/2023 PCP: Pcp, No    Brief Narrative:  73 year old with history of significant rectal metastatic cancer and chemotherapy brought to the hospital for change in mental status.  Apparently patient was in normal state of health per family about 4 days ago but since then has reported of abdominal pain and noted to be confused.  Upon admission he was noted to be tachycardic and hypotensive.  EKG had showed wide-complex tachycardia therefore cardioversion was performed by the ER provider.   Assessment & Plan:  Principal Problem:   Pneumonia Active Problems:   Rectal cancer (HCC)   Acute encephalopathy   Abdominal pain   CAP (community acquired pneumonia)   Hyponatremia   Ventricular tachycardia (HCC)   Ventricular tachycardia (HCC) Wide-complex ventricular tachycardia status post cardioversion in the ER.  Now in A-fib/normal sinus rhythm on telemetry.  Monitor and replete electrolytes as necessary.  TSH is within normal limits.  Cardiology team has been consulted. - Echocardiogram - ef 65%  Acute toxic/hepatic encephalopathy -CT of the head is negative.  Ammonia level elevated at 59. - Continue rifaximin.  Will add lactulose.  Need to have at least 3-4 soft bowel movements daily. -Check B12, folate.  TSH normal   Hyponatremia Check urine osmolality, serum osmolality, urine sodium   Influenza A pneumonia CTA chest is suggestive of atypical infection.  Respiratory panel is positive for influenza A pneumonia.  Will start him on Tamiflu for 5 days.  Bronchodilators as needed.  Currently on empiric Zosyn and doxycycline, will opt to de-escalate soon   Abdominal pain Appears to have resolved.  Abdominal x-ray is negative for any acute pathology.  Will continue to closely monitor this.   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 Mosetta Putt.  On  outpatient chemotherapy.  I will notify Dr. Rupert Stacks outpatient palliative for pain control  Essential hypertension -Continue home meds.  IV as needed   DVT prophylaxis: enoxaparin (LOVENOX) injection 40 mg Start: 11/25/23 0800 SCDs Start: 11/24/23 1633    Code Status: Full Code Family Communication:  daughter updated Status is: Inpatient Remains inpatient appropriate because: On going for flu    Subjective: Having diarrhea Denies SOB   Examination:  General exam: Appears calm and comfortable ; chronically ill Respiratory system: Clear to auscultation. Respiratory effort normal. Cardiovascular system: S1 & S2 heard, RRR. No JVD, murmurs, rubs, gallops or clicks. No pedal edema. Gastrointestinal system: Abdomen is nondistended, soft and nontender. No organomegaly or masses felt. Normal bowel sounds heard. Central nervous system: Alert and oriented. No focal neurological deficits. Extremities: Symmetric 5 x 5 power. Skin: No rashes, lesions or ulcers Psychiatry: Judgement and insight appear normal. Mood & affect appropriate.                Diet Orders (From admission, onward)     Start     Ordered   11/24/23 1633  Diet regular Room service appropriate? Yes; Fluid consistency: Thin  Diet effective now       Question Answer Comment  Room service appropriate? Yes   Fluid consistency: Thin      11/24/23 1633            Objective: Vitals:   11/25/23 0600 11/25/23 0700 11/25/23 0827 11/25/23 0949  BP: 120/68 (!) 144/68  129/64  Pulse: 66 72  83  Resp: (!) 22 (!) 25    Temp:  99.2 F (37.3 C)   TempSrc:   Oral   SpO2: 97% 99%    Weight:      Height:        Intake/Output Summary (Last 24 hours) at 11/25/2023 1131 Last data filed at 11/25/2023 1041 Gross per 24 hour  Intake 1001.82 ml  Output 550 ml  Net 451.82 ml   Filed Weights   11/24/23 1120 11/24/23 1700  Weight: 63.5 kg 59.8 kg    Scheduled Meds:  amLODipine  5 mg Oral Daily    Chlorhexidine Gluconate Cloth  6 each Topical Daily   enoxaparin (LOVENOX) injection  40 mg Subcutaneous Q24H   gabapentin  200 mg Oral TID   lactulose  30 g Oral TID   metoprolol tartrate  25 mg Oral BID   oseltamivir  75 mg Oral BID   pantoprazole (PROTONIX) IV  40 mg Intravenous Q24H   rifaximin  550 mg Oral BID   simethicone  80 mg Oral Once   Continuous Infusions:  doxycycline (VIBRAMYCIN) IV Stopped (11/25/23 0726)   piperacillin-tazobactam (ZOSYN)  IV 12.5 mL/hr at 11/25/23 0859    Nutritional status     Body mass index is 17.88 kg/m.  Data Reviewed:   CBC: Recent Labs  Lab 11/24/23 1124 11/25/23 0853  WBC 11.5* 5.9  NEUTROABS 8.9*  --   HGB 13.4 11.7*  HCT 41.8 36.5*  MCV 88.2 87.7  PLT 329 282   Basic Metabolic Panel: Recent Labs  Lab 11/24/23 1124 11/24/23 1140 11/25/23 0853  NA 128*  --  132*  K 3.9  --  3.5  CL 94*  --  96*  CO2 21*  --  26  GLUCOSE 128*  --  94  BUN 9  --  12  CREATININE 0.78  --  0.93  CALCIUM 7.9*  --  8.1*  MG  --  2.4  --    GFR: Estimated Creatinine Clearance: 60.7 mL/min (by C-G formula based on SCr of 0.93 mg/dL). Liver Function Tests: Recent Labs  Lab 11/24/23 1124 11/25/23 0853  AST 50* 34  ALT 33 27  ALKPHOS 130* 98  BILITOT 1.3* 1.3*  PROT 6.2* 6.1*  ALBUMIN 2.6* 2.4*   No results for input(s): "LIPASE", "AMYLASE" in the last 168 hours. Recent Labs  Lab 11/24/23 1125  AMMONIA 59*   Coagulation Profile: Recent Labs  Lab 11/24/23 1140 11/25/23 0853  INR 1.4* 1.5*   Cardiac Enzymes: No results for input(s): "CKTOTAL", "CKMB", "CKMBINDEX", "TROPONINI" in the last 168 hours. BNP (last 3 results) No results for input(s): "PROBNP" in the last 8760 hours. HbA1C: No results for input(s): "HGBA1C" in the last 72 hours. CBG: Recent Labs  Lab 11/24/23 1149  GLUCAP 101*   Lipid Profile: No results for input(s): "CHOL", "HDL", "LDLCALC", "TRIG", "CHOLHDL", "LDLDIRECT" in the last 72 hours. Thyroid  Function Tests: Recent Labs    11/24/23 1143  TSH 2.612  FREET4 0.99   Anemia Panel: Recent Labs    11/25/23 0853  VITAMINB12 3,003*   Sepsis Labs: Recent Labs  Lab 11/24/23 1302 11/25/23 0853  PROCALCITON  --  0.42  LATICACIDVEN 1.9  --     Recent Results (from the past 240 hours)  Resp panel by RT-PCR (RSV, Flu A&B, Covid) Anterior Nasal Swab     Status: Abnormal   Collection Time: 11/24/23 11:48 AM   Specimen: Anterior Nasal Swab  Result Value Ref Range Status   SARS Coronavirus 2 by RT PCR NEGATIVE NEGATIVE  Final    Comment: (NOTE) SARS-CoV-2 target nucleic acids are NOT DETECTED.  The SARS-CoV-2 RNA is generally detectable in upper respiratory specimens during the acute phase of infection. The lowest concentration of SARS-CoV-2 viral copies this assay can detect is 138 copies/mL. A negative result does not preclude SARS-Cov-2 infection and should not be used as the sole basis for treatment or other patient management decisions. A negative result may occur with  improper specimen collection/handling, submission of specimen other than nasopharyngeal swab, presence of viral mutation(s) within the areas targeted by this assay, and inadequate number of viral copies(<138 copies/mL). A negative result must be combined with clinical observations, patient history, and epidemiological information. The expected result is Negative.  Fact Sheet for Patients:  BloggerCourse.com  Fact Sheet for Healthcare Providers:  SeriousBroker.it  This test is no t yet approved or cleared by the [place] FDA and  has been authorized for detection and/or diagnosis of SARS-CoV-2 by FDA under an Emergency Use Authorization (EUA). This EUA will remain  in effect (meaning this test can be used) for the duration of the COVID-19 declaration under Section 564(b)(1) of the Act, 21 U.S.C.section 360bbb-3(b)(1), unless the authorization is  terminated  or revoked sooner.       Influenza A by PCR POSITIVE (A) NEGATIVE Final   Influenza B by PCR NEGATIVE NEGATIVE Final    Comment: (NOTE) The Xpert Xpress SARS-CoV-2/FLU/RSV plus assay is intended as an aid in the diagnosis of influenza from Nasopharyngeal swab specimens and should not be used as a sole basis for treatment. Nasal washings and aspirates are unacceptable for Xpert Xpress SARS-CoV-2/FLU/RSV testing.  Fact Sheet for Patients: BloggerCourse.com  Fact Sheet for Healthcare Providers: SeriousBroker.it  This test is not yet approved or cleared by the [place] FDA and has been authorized for detection and/or diagnosis of SARS-CoV-2 by FDA under an Emergency Use Authorization (EUA). This EUA will remain in effect (meaning this test can be used) for the duration of the COVID-19 declaration under Section 564(b)(1) of the Act, 21 U.S.C. section 360bbb-3(b)(1), unless the authorization is terminated or revoked.     Resp Syncytial Virus by PCR NEGATIVE NEGATIVE Final    Comment: (NOTE) Fact Sheet for Patients: BloggerCourse.com  Fact Sheet for Healthcare Providers: SeriousBroker.it  This test is not yet approved or cleared by the [place] FDA and has been authorized for detection and/or diagnosis of SARS-CoV-2 by FDA under an Emergency Use Authorization (EUA). This EUA will remain in effect (meaning this test can be used) for the duration of the COVID-19 declaration under Section 564(b)(1) of the Act, 21 U.S.C. section 360bbb-3(b)(1), unless the authorization is terminated or revoked.  Performed at [facility], [address], [place], [place] [address]   Blood culture (routine x 2)     Status: None (Preliminary result)   Collection Time: 11/24/23 12:12 PM   Specimen: BLOOD RIGHT HAND  Result Value Ref Range Status   Specimen  Description BLOOD RIGHT HAND  Final   Special Requests   Final    BOTTLES DRAWN AEROBIC ONLY Blood Culture results may not be optimal due to an inadequate volume of blood received in culture bottles   Culture   Final    NO GROWTH < 24 HOURS Performed at [facility], [address] [place], [place] [address]    Report Status PENDING  Incomplete  Blood culture (routine x 2)     Status: None (Preliminary result)   Collection  Time: 11/24/23  2:12 PM   Specimen: BLOOD  Result Value Ref Range Status   Specimen Description BLOOD SITE NOT SPECIFIED  Final   Special Requests   Final    AEROBIC BOTTLE ONLY Blood Culture results may not be optimal due to an inadequate volume of blood received in culture bottles   Culture   Final    NO GROWTH < 24 HOURS Performed at [facility], [address] [place], [place] [address]    Report Status PENDING  Incomplete  MRSA Next Gen by PCR, Nasal     Status: None   Collection Time: 11/24/23  6:18 PM   Specimen: Nasal Mucosa; Nasal Swab  Result Value Ref Range Status   MRSA by PCR Next Gen NOT DETECTED NOT DETECTED Final    Comment: (NOTE) The GeneXpert MRSA Assay (FDA approved for NASAL specimens only), is one component of a comprehensive MRSA colonization surveillance program. It is not intended to diagnose MRSA infection nor to guide or monitor treatment for MRSA infections. Test performance is not FDA approved in patients less than 69 years old. Performed at [facility], [address] [place], [place] [address]          Radiology Studies: ECHOCARDIOGRAM COMPLETE Result Date: 11/25/2023    ECHOCARDIOGRAM REPORT   Patient Name:   Jorge [name] Date of Exam: 11/25/2023 Medical Rec #:  [id number]        Height:       72.0 in Accession #:    [id number]       Weight:       131.8 lb Date of Birth:  Mar 20, 1951        BSA:          1.785 m Patient Age:    72 years         BP:           144/68 mmHg Patient Gender: M                 HR:           73 bpm. Exam Location:  Inpatient Procedure: 2D Echo, Cardiac Doppler and Color Doppler Indications:    Ventricular tachycardia  History:        Patient has no prior history of Echocardiogram examinations.  Sonographer:    Vern Claude Referring Phys: [id number] HERSH GOEL IMPRESSIONS  1. Left ventricular ejection fraction, by estimation, is 60 to 65%. The left ventricle has normal function. The left ventricle has no regional wall motion abnormalities. Left ventricular diastolic parameters were normal.  2. Right ventricular systolic function is normal. The right ventricular size is normal. There is normal pulmonary artery systolic pressure. The estimated right ventricular systolic pressure is 27.8 mmHg.  3. The mitral valve is normal in structure. No evidence of mitral valve regurgitation. No evidence of mitral stenosis.  4. The aortic valve is grossly normal. Aortic valve regurgitation is not visualized. No aortic stenosis is present.  5. The inferior vena cava is normal in size with greater than 50% respiratory variability, suggesting right atrial pressure of 3 mmHg. FINDINGS  Left Ventricle: Left ventricular ejection fraction, by estimation, is 60 to 65%. The left ventricle has normal function. The left ventricle has no regional wall motion abnormalities. The left ventricular internal cavity size was normal in size. There is  no left ventricular hypertrophy. Left ventricular diastolic parameters were normal. Right Ventricle: The right ventricular size is normal. No increase  in right ventricular wall thickness. Right ventricular systolic function is normal. There is normal pulmonary artery systolic pressure. The tricuspid regurgitant velocity is 2.49 m/s, and  with an assumed right atrial pressure of 3 mmHg, the estimated right ventricular systolic pressure is 27.8 mmHg. Left Atrium: Left atrial size was normal in size. Right Atrium: Right atrial size was normal in size. Pericardium: There  is no evidence of pericardial effusion. Mitral Valve: The mitral valve is normal in structure. No evidence of mitral valve regurgitation. No evidence of mitral valve stenosis. MV peak gradient, 3.3 mmHg. The mean mitral valve gradient is 1.0 mmHg. Tricuspid Valve: The tricuspid valve is normal in structure. Tricuspid valve regurgitation is mild . No evidence of tricuspid stenosis. Aortic Valve: The aortic valve is grossly normal. Aortic valve regurgitation is not visualized. No aortic stenosis is present. Aortic valve mean gradient measures 4.0 mmHg. Aortic valve peak gradient measures 7.0 mmHg. Aortic valve area, by VTI measures 3.10 cm. Pulmonic Valve: The pulmonic valve was normal in structure. Pulmonic valve regurgitation is not visualized. No evidence of pulmonic stenosis. Aorta: The aortic root is normal in size and structure. Venous: The inferior vena cava is normal in size with greater than 50% respiratory variability, suggesting right atrial pressure of 3 mmHg. IAS/Shunts: No atrial level shunt detected by color flow Doppler.  LEFT VENTRICLE PLAX 2D LVIDd:         5.20 cm      Diastology LVIDs:         3.50 cm      LV e' medial:    5.98 cm/s LV PW:         0.70 cm      LV E/e' medial:  13.0 LV IVS:        0.70 cm      LV e' lateral:   10.10 cm/s LVOT diam:     2.10 cm      LV E/e' lateral: 7.7 LV SV:         74 LV SV Index:   42 LVOT Area:     3.46 cm  LV Volumes (MOD) LV vol d, MOD A2C: 97.3 ml LV vol d, MOD A4C: 148.0 ml LV vol s, MOD A2C: 37.3 ml LV vol s, MOD A4C: 51.1 ml LV SV MOD A2C:     60.0 ml LV SV MOD A4C:     148.0 ml LV SV MOD BP:      79.4 ml RIGHT VENTRICLE             IVC RV Basal diam:  3.20 cm     IVC diam: 1.30 cm RV Mid diam:    3.10 cm RV S prime:     13.10 cm/s TAPSE (M-mode): 1.9 cm LEFT ATRIUM             Index        RIGHT ATRIUM          Index LA diam:        2.50 cm 1.40 cm/m   RA Area:     7.31 cm LA Vol (A2C):   16.3 ml 9.13 ml/m   RA Volume:   13.40 ml 7.51 ml/m LA Vol  (A4C):   37.4 ml 20.96 ml/m LA Biplane Vol: 24.6 ml 13.78 ml/m  AORTIC VALVE                    PULMONIC VALVE AV Area (Vmax):    3.07 cm  PV Vmax:       0.66 m/s AV Area (Vmean):   2.66 cm     PV Peak grad:  1.7 mmHg AV Area (VTI):     3.10 cm AV Vmax:           132.00 cm/s AV Vmean:          89.500 cm/s AV VTI:            0.240 m AV Peak Grad:      7.0 mmHg AV Mean Grad:      4.0 mmHg LVOT Vmax:         117.00 cm/s LVOT Vmean:        68.800 cm/s LVOT VTI:          0.215 m LVOT/AV VTI ratio: 0.90  AORTA Ao Root diam: 3.10 cm Ao Asc diam:  3.10 cm MITRAL VALVE               TRICUSPID VALVE MV Area (PHT): 2.95 cm    TR Peak grad:   24.8 mmHg MV Area VTI:   3.02 cm    TR Vmax:        249.00 cm/s MV Peak grad:  3.3 mmHg MV Mean grad:  1.0 mmHg    SHUNTS MV Vmax:       0.90 m/s    Systemic VTI:  0.22 m MV Vmean:      56.5 cm/s   Systemic Diam: 2.10 cm MV Decel Time: 257 msec MV E velocity: 77.90 cm/s MV A velocity: 86.80 cm/s MV E/A ratio:  0.90 Mihai Croitoru MD Electronically signed by Thurmon Fair MD Signature Date/Time: 11/25/2023/8:51:57 AM    Final    DG Abd 1 View Result Date: 11/24/2023 CLINICAL DATA:  Altered mental status, rectal pain. EXAM: ABDOMEN - 1 VIEW COMPARISON:  CT abdomen pelvis dated 10/09/2023. FINDINGS: The bowel gas pattern is normal. No calculi overlying the expected course of the urinary tract. Degenerative changes are seen in the spine and hips. Excreted contrast is seen in the bladder. IMPRESSION: Nonobstructive bowel gas pattern. Electronically Signed   By: Romona Curls M.D.   On: 11/24/2023 16:21   CT Angio Chest PE W and/or Wo Contrast Result Date: 11/24/2023 CLINICAL DATA:  PE suspected. High probability. History of colorectal carcinoma. * Tracking Code: BO * EXAM: CT ANGIOGRAPHY CHEST WITH CONTRAST TECHNIQUE: Multidetector CT imaging of the chest was performed using the standard protocol during bolus administration of intravenous contrast. Multiplanar CT image  reconstructions and MIPs were obtained to evaluate the vascular anatomy. RADIATION DOSE REDUCTION: This exam was performed according to the departmental dose-optimization program which includes automated exposure control, adjustment of the mA and/or kV according to patient size and/or use of iterative reconstruction technique. CONTRAST:  75mL OMNIPAQUE IOHEXOL 350 MG/ML SOLN COMPARISON:  10/09/2023. FINDINGS: Cardiovascular: Satisfactory opacification of the pulmonary arteries to the segmental level. No evidence of pulmonary embolism. Normal heart size. No pericardial effusion. Coronary artery calcifications. Mediastinum/Nodes: Thyroid gland, trachea, and esophagus demonstrate no significant findings. Subcarinal lymph node measures 1.1 cm, image 87/4. This is new from previous exam. Enlarged right hilar lymph node is also new measuring 1.9 cm, image 85/4. Lungs/Pleura: Emphysema. Diffuse bronchial wall thickening. No pleural effusion, airspace consolidation, atelectasis or pneumothorax. Multifocal pulmonary nodules are again noted. Index nodule within the right upper lobe measures 2.4 cm, image 71/12. This is compared with 2.5 cm previously. Index nodule within the right middle lobe measures 2.8 cm, image 96/12.  Also unchanged from previous exam. Index nodule within the anterior right apex measures 2 cm, image 39/12. SINCE THE PREVIOUS EXAM THERE IS BEEN INTERVAL DEVELOPMENT OF EXTENSIVE IRREGULAR AND ILL-DEFINED IRREGULAR PERIBRONCHOVASCULAR NODULAR DENSITIES THROUGHOUT BOTH LOWER LOBES, RIGHT MIDDLE LOBE AND RIGHT UPPER LOBE.  MANY OF THESE APPEAR TO HAVE A CAVITARY COMPONENT.  INDEX NODULES INCLUDE: SINCE THE PREVIOUS EXAM THERE IS BEEN INTERVAL DEVELOPMENT OF EXTENSIVE IRREGULAR AND ILL-DEFINED IRREGULAR PERIBRONCHOVASCULAR NODULAR DENSITIES THROUGHOUT BOTH LOWER LOBES, RIGHT MIDDLE LOBE AND RIGHT UPPER LOBE. MANY OF THESE APPEAR TO HAVE A CAVITARY COMPONENT. INDEX NODULES INCLUDE -nodule within the lateral  right lower lobe measures 1.2 cm, image 106/2. -Nodule in the posterior right lower lobe measures 1 cm, image 112/2. -nodule within the right upper lobe measures 0.9 cm, image 56/12. Upper Abdomen: No acute abnormality within the imaged portions of the upper abdomen. Sub optimal visualization of previously noted scattered low-density liver lesions due to phase of contrast opacification. Gallbladder appears distended. Small amount of perihepatic ascites overlies the right hepatic lobe. Musculoskeletal: No chest wall abnormality. No acute or significant osseous findings. Review of the MIP images confirms the above findings. IMPRESSION: 1. No evidence for acute pulmonary embolus. 2. Interval development of extensive irregular and ill-defined irregular peribronchovascular nodular densities throughout both lower lobes, right middle lobe and right upper lobe. Associated diffuse bronchial wall thickening is identified throughout both lungs. Findings are favored to represent atypical infection such as fungal or atypical mycobacterial infection. Metastatic disease is considered less favored. 3. No significant change in the appearance of previously noted pulmonary metastasis. 4. New mediastinal and right hilar lymphadenopathy. In the acute setting this may reflect reactive adenopathy secondary to infection. Nodal metastasis is considered less favored but cannot be excluded with a high degree of certainty. 5. Coronary artery calcifications. 6. Small amount of perihepatic ascites overlies the right hepatic lobe. 7.  Emphysema (ICD10-J43.9). Electronically Signed   By: Signa Kell M.D.   On: 11/24/2023 14:33   CT Head Wo Contrast Result Date: 11/24/2023 CLINICAL DATA:  Altered mental status. Incontinence and decreased urine output. Intermittent rectal pain. EXAM: CT HEAD WITHOUT CONTRAST TECHNIQUE: Contiguous axial images were obtained from the base of the skull through the vertex without intravenous contrast. RADIATION  DOSE REDUCTION: This exam was performed according to the departmental dose-optimization program which includes automated exposure control, adjustment of the mA and/or kV according to patient size and/or use of iterative reconstruction technique. COMPARISON:  None Available. FINDINGS: Brain: Ventricles, cisterns and other CSF spaces are normal. There is no mass, mass effect, shift of midline structures or acute hemorrhage. No evidence of acute infarction. Vascular: No hyperdense vessel or unexpected calcification. Skull: Normal. Negative for fracture or focal lesion. Sinuses/Orbits: Orbits are normal. Mild to moderate chronic inflammatory change of the paranasal sinuses predominately involving the maxillary and ethmoid sinuses. Other: None. IMPRESSION: 1. No acute intracranial findings. 2. Mild to moderate chronic inflammatory change of the paranasal sinuses. Electronically Signed   By: Elberta Fortis M.D.   On: 11/24/2023 14:26           LOS: 1 day   Time spent= 35 mins    Miguel Rota, MD [facility]  If 7PM-7AM, please contact night-coverage  11/25/2023, 11:31 AM

## 2023-11-26 DIAGNOSIS — I499 Cardiac arrhythmia, unspecified: Secondary | ICD-10-CM | POA: Diagnosis not present

## 2023-11-26 DIAGNOSIS — E876 Hypokalemia: Secondary | ICD-10-CM

## 2023-11-26 LAB — COMPREHENSIVE METABOLIC PANEL
ALT: 23 U/L (ref 0–44)
AST: 26 U/L (ref 15–41)
Albumin: 2.3 g/dL — ABNORMAL LOW (ref 3.5–5.0)
Alkaline Phosphatase: 96 U/L (ref 38–126)
Anion gap: 7 (ref 5–15)
BUN: 10 mg/dL (ref 8–23)
CO2: 27 mmol/L (ref 22–32)
Calcium: 7.9 mg/dL — ABNORMAL LOW (ref 8.9–10.3)
Chloride: 99 mmol/L (ref 98–111)
Creatinine, Ser: 0.95 mg/dL (ref 0.61–1.24)
GFR, Estimated: >60 mL/min (ref >60)
Glucose, Bld: 97 mg/dL (ref 70–99)
Potassium: 3.3 mmol/L — ABNORMAL LOW (ref 3.5–5.1)
Sodium: 133 mmol/L — ABNORMAL LOW (ref 135–145)
Total Bilirubin: 0.8 mg/dL (ref 0.0–1.2)
Total Protein: 6 g/dL — ABNORMAL LOW (ref 6.5–8.1)

## 2023-11-26 LAB — CBC
HCT: 36.4 % — ABNORMAL LOW (ref 39.0–52.0)
Hemoglobin: 11.4 g/dL — ABNORMAL LOW (ref 13.0–17.0)
MCH: 27.8 pg (ref 26.0–34.0)
MCHC: 31.3 g/dL (ref 30.0–36.0)
MCV: 88.8 fL (ref 80.0–100.0)
Platelets: 284 10*3/uL (ref 150–400)
RBC: 4.1 MIL/uL — ABNORMAL LOW (ref 4.22–5.81)
RDW: 16.2 % — ABNORMAL HIGH (ref 11.5–15.5)
WBC: 5.5 10*3/uL (ref 4.0–10.5)
nRBC: 0 % (ref 0.0–0.2)

## 2023-11-26 LAB — FOLATE: Folate: 23.9 ng/mL (ref >5.9)

## 2023-11-26 LAB — OSMOLALITY, URINE: Osmolality, Ur: 319 mosm/kg (ref 300–900)

## 2023-11-26 LAB — MAGNESIUM: Magnesium: 2.3 mg/dL (ref 1.7–2.4)

## 2023-11-26 LAB — SODIUM, URINE, RANDOM: Sodium, Ur: 11 mmol/L

## 2023-11-26 LAB — PHOSPHORUS: Phosphorus: 3.4 mg/dL (ref 2.5–4.6)

## 2023-11-26 LAB — AMMONIA: Ammonia: 26 umol/L (ref 9–35)

## 2023-11-26 MED ORDER — LACTULOSE 10 GM/15ML PO SOLN
30.0000 g | Freq: Three times a day (TID) | ORAL | Status: DC
  Administered 2023-11-26: 30 g via ORAL
  Filled 2023-11-26: qty 45

## 2023-11-26 MED ORDER — POLYETHYLENE GLYCOL 3350 17 G PO PACK: 17.0000 g | PACK | Freq: Every day | ORAL | Status: DC | PRN

## 2023-11-26 MED ORDER — DOXYCYCLINE HYCLATE 100 MG PO TABS: 100.0000 mg | ORAL_TABLET | Freq: Two times a day (BID) | ORAL | Status: DC

## 2023-11-26 MED ORDER — POTASSIUM CHLORIDE CRYS ER 20 MEQ PO TBCR
40.0000 meq | EXTENDED_RELEASE_TABLET | ORAL | Status: DC
  Administered 2023-11-26: 40 meq via ORAL
  Filled 2023-11-26: qty 2

## 2023-11-26 MED ORDER — METOPROLOL TARTRATE 25 MG PO TABS: 25.0000 mg | ORAL_TABLET | Freq: Two times a day (BID) | ORAL | Status: DC

## 2023-11-26 MED ORDER — LACTULOSE 10 GM/15ML PO SOLN: 30.0000 g | Freq: Three times a day (TID) | ORAL | Status: DC

## 2023-11-26 MED ORDER — ACETAMINOPHEN 325 MG PO TABS: 650.0000 mg | ORAL_TABLET | Freq: Four times a day (QID) | ORAL | Status: DC | PRN

## 2023-11-26 MED ORDER — DOXYCYCLINE HYCLATE 100 MG PO TABS
100.0000 mg | ORAL_TABLET | Freq: Two times a day (BID) | ORAL | Status: DC
  Administered 2023-11-26 – 2023-11-27 (×2): 100 mg via ORAL
  Filled 2023-11-26 (×2): qty 1

## 2023-11-26 MED ORDER — ACETAMINOPHEN 650 MG RE SUPP: 650.0000 mg | Freq: Four times a day (QID) | RECTAL | Status: DC | PRN

## 2023-11-26 MED ORDER — TRAZODONE HCL 50 MG PO TABS
50.0000 mg | ORAL_TABLET | Freq: Every evening | ORAL | Status: DC | PRN
  Administered 2023-11-26: 50 mg via ORAL
  Filled 2023-11-26: qty 1

## 2023-11-26 MED ORDER — GUAIFENESIN 100 MG/5ML PO LIQD: 5.0000 mL | ORAL | Status: DC | PRN

## 2023-11-26 MED ORDER — ACETAMINOPHEN 325 MG PO TABS
650.0000 mg | ORAL_TABLET | Freq: Four times a day (QID) | ORAL | Status: DC | PRN
  Administered 2023-11-26: 650 mg via ORAL
  Filled 2023-11-26: qty 2

## 2023-11-26 MED ORDER — RIFAXIMIN 550 MG PO TABS: 550.0000 mg | ORAL_TABLET | Freq: Two times a day (BID) | ORAL | Status: DC

## 2023-11-26 MED ORDER — METOPROLOL TARTRATE 25 MG PO TABS
25.0000 mg | ORAL_TABLET | Freq: Two times a day (BID) | ORAL | Status: DC
  Administered 2023-11-26 – 2023-11-27 (×2): 25 mg via ORAL
  Filled 2023-11-26 (×2): qty 1

## 2023-11-26 MED ORDER — SENNOSIDES-DOCUSATE SODIUM 8.6-50 MG PO TABS: 1.0000 | ORAL_TABLET | Freq: Every evening | ORAL | Status: DC | PRN

## 2023-11-26 MED ORDER — OXYCODONE HCL 5 MG PO TABS
5.0000 mg | ORAL_TABLET | Freq: Three times a day (TID) | ORAL | Status: DC | PRN
  Administered 2023-11-26 – 2023-11-27 (×2): 5 mg via ORAL
  Filled 2023-11-26 (×2): qty 1

## 2023-11-26 MED ORDER — OSELTAMIVIR PHOSPHATE 75 MG PO CAPS
75.0000 mg | ORAL_CAPSULE | Freq: Two times a day (BID) | ORAL | Status: DC
  Administered 2023-11-26: 75 mg
  Filled 2023-11-26: qty 1

## 2023-11-26 MED ORDER — CALCIUM GLUCONATE-NACL 2-0.675 GM/100ML-% IV SOLN
2.0000 g | Freq: Once | INTRAVENOUS | Status: AC
  Administered 2023-11-26: 2000 mg via INTRAVENOUS
  Filled 2023-11-26: qty 100

## 2023-11-26 MED ORDER — RIFAXIMIN 550 MG PO TABS
550.0000 mg | ORAL_TABLET | Freq: Two times a day (BID) | ORAL | Status: DC
  Administered 2023-11-26 – 2023-11-27 (×2): 550 mg via ORAL
  Filled 2023-11-26 (×2): qty 1

## 2023-11-26 MED ORDER — GABAPENTIN 100 MG PO CAPS
200.0000 mg | ORAL_CAPSULE | Freq: Three times a day (TID) | ORAL | Status: DC
Start: 2023-11-26 — End: 2023-11-27
  Administered 2023-11-26 – 2023-11-27 (×4): 200 mg via ORAL
  Filled 2023-11-26 (×4): qty 2

## 2023-11-26 MED ORDER — POTASSIUM CHLORIDE CRYS ER 20 MEQ PO TBCR
40.0000 meq | EXTENDED_RELEASE_TABLET | Freq: Once | ORAL | Status: AC
  Administered 2023-11-26: 40 meq via ORAL
  Filled 2023-11-26: qty 2

## 2023-11-26 MED ORDER — OXYCODONE HCL 5 MG PO TABS: 5.0000 mg | ORAL_TABLET | Freq: Three times a day (TID) | ORAL | Status: DC | PRN

## 2023-11-26 MED ORDER — OSELTAMIVIR PHOSPHATE 75 MG PO CAPS
75.0000 mg | ORAL_CAPSULE | Freq: Two times a day (BID) | ORAL | Status: DC
  Administered 2023-11-26 – 2023-11-27 (×2): 75 mg via ORAL
  Filled 2023-11-26 (×2): qty 1

## 2023-11-26 MED ORDER — AMLODIPINE BESYLATE 5 MG PO TABS: 5.0000 mg | ORAL_TABLET | Freq: Every day | ORAL | Status: DC

## 2023-11-26 MED ORDER — AMLODIPINE BESYLATE 5 MG PO TABS
5.0000 mg | ORAL_TABLET | Freq: Every day | ORAL | Status: DC
  Administered 2023-11-27: 5 mg via ORAL
  Filled 2023-11-26: qty 1

## 2023-11-26 MED ORDER — POTASSIUM CHLORIDE 20 MEQ PO PACK: 40.0000 meq | PACK | Freq: Once | ORAL | Status: DC

## 2023-11-26 MED ORDER — TRAZODONE HCL 50 MG PO TABS: 50.0000 mg | ORAL_TABLET | Freq: Every evening | ORAL | Status: DC | PRN

## 2023-11-26 MED ORDER — GABAPENTIN 250 MG/5ML PO SOLN
200.0000 mg | Freq: Three times a day (TID) | ORAL | Status: DC
  Filled 2023-11-26: qty 4

## 2023-11-26 MED ORDER — PANTOPRAZOLE SODIUM 40 MG PO TBEC
40.0000 mg | DELAYED_RELEASE_TABLET | Freq: Every day | ORAL | Status: DC
  Administered 2023-11-26: 40 mg via ORAL
  Filled 2023-11-26: qty 1

## 2023-11-26 NOTE — Evaluation (Signed)
Occupational Therapy Evaluation Patient Details Name: Jorge [name] MRN: [id number] DOB: 27-Nov-1950 Today's Date: 11/26/2023   History of Present Illness 73 year old male brought to [facility] 11/24/23 with AMS, abdominal pain,.  Upon admission he was noted to be tachycardic and hypotensive.  EKG had showed wide-complex tachycardia therefore cardioversion was performed by the ER provider. DDU:KGURKY metastatic cancer and chemotherapy, anemia.   Clinical Impression   Patient is a 74 year old male who was admitted for above. Patient reported living with daughter but stating he was able to complete ADLs himself. Patient was noted to have some confusion during session and difficulty with completion of thoughts at times. Patient was noted to have decreased functional activity tolerance, decreased endurance, decreased standing balance, decreased safety awareness, and decreased knowledge of AD/AE impacting participation in ADLs. Patient would continue to benefit from skilled OT services at this time while admitted and after d/c to address noted deficits in order to improve overall safety and independence in ADLs. Patient will benefit from continued inpatient follow up therapy, <3 hours/day       If plan is discharge home, recommend the following: A little help with walking and/or transfers;A little help with bathing/dressing/bathroom;Assistance with cooking/housework;Direct supervision/assist for medications management;Assist for transportation;Help with stairs or ramp for entrance;Direct supervision/assist for financial management    Functional Status Assessment  Patient has not had a recent decline in their functional status        Precautions / Restrictions Precautions Precautions: Fall Precaution Comments: R port a cath Restrictions Weight Bearing Restrictions Per Provider Order: No      Mobility Bed Mobility Overal bed mobility: Needs Assistance Bed Mobility: Supine to Sit     Supine to  sit: Supervision                 Balance Overall balance assessment: Mild deficits observed, not formally tested           ADL either performed or assessed with clinical judgement   ADL Overall ADL's : Needs assistance/impaired Eating/Feeding: Set up;Sitting   Grooming: Sitting;Minimal assistance   Upper Body Bathing: Sitting;Contact guard assist   Lower Body Bathing: Maximal assistance;Sitting/lateral leans Lower Body Bathing Details (indicate cue type and reason): abdominal pain impacting movement for simulated task. Upper Body Dressing : Sitting;Contact guard assist   Lower Body Dressing: Sitting/lateral leans;Maximal assistance   Toilet Transfer: Contact guard assist;Stand-pivot;Rolling walker (2 wheels) Toilet Transfer Details (indicate cue type and reason): to recliner in room with increaed time. Toileting- Clothing Manipulation and Hygiene: Sitting/lateral lean;Maximal assistance               Vision   Vision Assessment?: No apparent visual deficits            Pertinent Vitals/Pain Pain Assessment Pain Assessment: Faces Faces Pain Scale: Hurts whole lot Pain Location: abdomen Pain Descriptors / Indicators: Aching, Discomfort, Moaning, Cramping Pain Intervention(s): Limited activity within patient's tolerance, Monitored during session, Repositioned     Extremity/Trunk Assessment Upper Extremity Assessment Upper Extremity Assessment: Overall WFL for tasks assessed   Lower Extremity Assessment Lower Extremity Assessment: Defer to PT evaluation   Cervical / Trunk Assessment Cervical / Trunk Assessment: Normal   Communication Communication Communication: No apparent difficulties   Cognition Arousal: Alert Behavior During Therapy: Restless, Flat affect Overall Cognitive Status: Difficult to assess             General Comments: patient asking for help but unclear as to desires. needed increased cues for naming this place. "Mildred  chemo  therapy"                Home Living Family/patient expects to be discharged to:: Private residence Living Arrangements: Children Available Help at Discharge: Family;Available PRN/intermittently Type of Home: Apartment                       Home Equipment: Other (comment) (unsure)   Additional Comments: patient  unable to answer home questions, no family present      Prior Functioning/Environment Prior Level of Function : Needs assist             Mobility Comments: pt reports ambulatory          OT Problem List: Impaired balance (sitting and/or standing);Decreased safety awareness;Decreased knowledge of precautions;Cardiopulmonary status limiting activity;Decreased knowledge of use of DME or AE;Decreased activity tolerance      OT Treatment/Interventions: Balance training;Therapeutic activities;DME and/or AE instruction;Self-care/ADL training;Therapeutic exercise;Patient/family education    OT Goals(Current goals can be found in the care plan section) Acute Rehab OT Goals Patient Stated Goal: none stated OT Goal Formulation: With patient Time For Goal Achievement: 12/10/23 Potential to Achieve Goals: Fair  OT Frequency: Min 1X/week    Co-evaluation   Reason for Co-Treatment: To address functional/ADL transfers;For patient/therapist safety PT goals addressed during session: Mobility/safety with mobility        AM-PAC OT "6 Clicks" Daily Activity     Outcome Measure Help from another person eating meals?: None Help from another person taking care of personal grooming?: A Little Help from another person toileting, which includes using toliet, bedpan, or urinal?: A Lot Help from another person bathing (including washing, rinsing, drying)?: A Little Help from another person to put on and taking off regular upper body clothing?: A Little Help from another person to put on and taking off regular lower body clothing?: A Lot 6 Click Score: 17   End of  Session    Activity Tolerance: Patient tolerated treatment well Patient left: in chair;with call bell/phone within reach;with chair alarm set  OT Visit Diagnosis: Unsteadiness on feet (R26.81);Other abnormalities of gait and mobility (R26.89)                Time: 6283-1517 OT Time Calculation (min): 12 min Charges:  OT General Charges $OT Visit: 1 Visit OT Evaluation $OT Eval Low Complexity: 1 Low  Chloe Flis OTR/L, MS Acute Rehabilitation Department Office# [phone]   Selinda Flavin 11/26/2023, 4:13 PM

## 2023-11-26 NOTE — Progress Notes (Signed)
PROGRESS NOTE    Jorge [name]  WUJ:[id number] DOB: 29-Mar-1951 DOA: 11/24/2023 PCP: Pcp, No    Brief Narrative:  73 year old with history of significant rectal metastatic cancer and chemotherapy brought to the hospital for change in mental status.  Apparently patient was in normal state of health per family about 4 days ago but since then has reported of abdominal pain and noted to be confused.  Upon admission he was noted to be tachycardic and hypotensive.  EKG had showed wide-complex tachycardia therefore cardioversion was performed by the ER provider.   Assessment & Plan:  Principal Problem:   Pneumonia Active Problems:   Rectal cancer (HCC)   Acute encephalopathy   Abdominal pain   CAP (community acquired pneumonia)   Hyponatremia   Ventricular tachycardia (HCC)   Ventricular tachycardia (HCC) Wide-complex ventricular tachycardia status post cardioversion in the ER.  TSH normal, seen by cardiology.  Added metoprolol 25 mg twice daily. - Echocardiogram - ef 65%  Acute toxic/hepatic encephalopathy -CT of the head is negative.  Ammonia level elevated at 59.  Continue rifaximin and ammonia.  Titrate about 3 bowel movements daily.  TSH, B12 normal   Hyponatremia, improved Improved.  Initial urine studies were not collected   Influenza A pneumonia CTA chest is suggestive of atypical infection.  Respiratory panel is positive for influenza A pneumonia.  Tamiflu total 5 days, bronchodilators, I-S/flutter valve.  Transition antibiotics to doxycycline.  Discontinue Zosyn.   Abdominal pain Appears to have resolved.  Abdominal x-ray is negative for any acute pathology.  Will continue to closely monitor this.   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 Mosetta Putt for chemotherapy, she is aware of patient's admission Follows outpatient palliative for pain control  Essential hypertension -Continue home meds.  IV as  needed  Mild hypocalcemia/hypokalemia - As needed repletion  PT/OT   DVT prophylaxis: enoxaparin (LOVENOX) injection 40 mg Start: 11/25/23 0800 SCDs Start: 11/24/23 1633    Code Status: Full Code Family Communication:  Called [name] Status is: Inpatient Remains inpatient appropriate because: On going for flu    Subjective: Having diarrhea which is chronic Feeling better.    Examination:  General exam: Appears calm and comfortable ; chronically ill Respiratory system: Clear to auscultation. Respiratory effort normal. Cardiovascular system: S1 & S2 heard, RRR. No JVD, murmurs, rubs, gallops or clicks. No pedal edema. Gastrointestinal system: Abdomen is nondistended, soft and nontender. No organomegaly or masses felt. Normal bowel sounds heard. Central nervous system: Alert and oriented. No focal neurological deficits. Extremities: Symmetric 5 x 5 power. Skin: No rashes, lesions or ulcers Psychiatry: Judgement and insight appear normal. Mood & affect appropriate.                Diet Orders (From admission, onward)     Start     Ordered   11/24/23 1633  Diet regular Room service appropriate? Yes; Fluid consistency: Thin  Diet effective now       Question Answer Comment  Room service appropriate? Yes   Fluid consistency: Thin      11/24/23 1633            Objective: Vitals:   11/26/23 0520 11/26/23 0600 11/26/23 0621 11/26/23 0800  BP:   (!) 141/77 137/80  Pulse: 60  61   Resp: (!) 25 (!) 24 (!) 28 (!) 26  Temp:    (!) 97.2 F (36.2 C)  TempSrc:    Axillary  SpO2:  97% 96% 96% 96%  Weight:      Height:        Intake/Output Summary (Last 24 hours) at 11/26/2023 1200 Last data filed at 11/26/2023 1000 Gross per 24 hour  Intake 238.85 ml  Output 1000 ml  Net -761.15 ml   Filed Weights   11/24/23 1120 11/24/23 1700  Weight: 63.5 kg 59.8 kg    Scheduled Meds:  [START ON 11/27/2023] amLODipine  5 mg Oral Daily   Chlorhexidine Gluconate Cloth  6  each Topical Daily   doxycycline  100 mg Oral Q12H   enoxaparin (LOVENOX) injection  40 mg Subcutaneous Q24H   gabapentin  200 mg Oral Q8H   lactulose  30 g Oral TID   metoprolol tartrate  25 mg Oral BID   oseltamivir  75 mg Oral BID   pantoprazole (PROTONIX) IV  40 mg Intravenous Q24H   potassium chloride  40 mEq Oral Once   rifaximin  550 mg Oral BID   simethicone  80 mg Oral Once   Continuous Infusions:  Nutritional status     Body mass index is 17.88 kg/m.  Data Reviewed:   CBC: Recent Labs  Lab 11/24/23 1124 11/25/23 0853 11/26/23 0620  WBC 11.5* 5.9 5.5  NEUTROABS 8.9*  --   --   HGB 13.4 11.7* 11.4*  HCT 41.8 36.5* 36.4*  MCV 88.2 87.7 88.8  PLT 329 282 284   Basic Metabolic Panel: Recent Labs  Lab 11/24/23 1124 11/24/23 1140 11/25/23 0853 11/26/23 0620  NA 128*  --  132* 133*  K 3.9  --  3.5 3.3*  CL 94*  --  96* 99  CO2 21*  --  26 27  GLUCOSE 128*  --  94 97  BUN 9  --  12 10  CREATININE 0.78  --  0.93 0.95  CALCIUM 7.9*  --  8.1* 7.9*  MG  --  2.4  --  2.3  PHOS  --   --   --  3.4   GFR: Estimated Creatinine Clearance: 59.5 mL/min (by C-G formula based on SCr of 0.95 mg/dL). Liver Function Tests: Recent Labs  Lab 11/24/23 1124 11/25/23 0853 11/26/23 0620  AST 50* 34 26  ALT 33 27 23  ALKPHOS 130* 98 96  BILITOT 1.3* 1.3* 0.8  PROT 6.2* 6.1* 6.0*  ALBUMIN 2.6* 2.4* 2.3*   No results for input(s): "LIPASE", "AMYLASE" in the last 168 hours. Recent Labs  Lab 11/24/23 1125 11/26/23 0920  AMMONIA 59* 26   Coagulation Profile: Recent Labs  Lab 11/24/23 1140 11/25/23 0853  INR 1.4* 1.5*   Cardiac Enzymes: No results for input(s): "CKTOTAL", "CKMB", "CKMBINDEX", "TROPONINI" in the last 168 hours. BNP (last 3 results) No results for input(s): "PROBNP" in the last 8760 hours. HbA1C: No results for input(s): "HGBA1C" in the last 72 hours. CBG: Recent Labs  Lab 11/24/23 1149  GLUCAP 101*   Lipid Profile: No results for  input(s): "CHOL", "HDL", "LDLCALC", "TRIG", "CHOLHDL", "LDLDIRECT" in the last 72 hours. Thyroid Function Tests: Recent Labs    11/24/23 1143  TSH 2.612  FREET4 0.99   Anemia Panel: Recent Labs    11/25/23 0853 11/26/23 0620  VITAMINB12 3,003*  --   FOLATE  --  23.9   Sepsis Labs: Recent Labs  Lab 11/24/23 1302 11/25/23 0853  PROCALCITON  --  0.42  LATICACIDVEN 1.9  --     Recent Results (from the past 240 hours)  Resp panel by RT-PCR (  RSV, Flu A&B, Covid) Anterior Nasal Swab     Status: Abnormal   Collection Time: 11/24/23 11:48 AM   Specimen: Anterior Nasal Swab  Result Value Ref Range Status   SARS Coronavirus 2 by RT PCR NEGATIVE NEGATIVE Final    Comment: (NOTE) SARS-CoV-2 target nucleic acids are NOT DETECTED.  The SARS-CoV-2 RNA is generally detectable in upper respiratory specimens during the acute phase of infection. The lowest concentration of SARS-CoV-2 viral copies this assay can detect is 138 copies/mL. A negative result does not preclude SARS-Cov-2 infection and should not be used as the sole basis for treatment or other patient management decisions. A negative result may occur with  improper specimen collection/handling, submission of specimen other than nasopharyngeal swab, presence of viral mutation(s) within the areas targeted by this assay, and inadequate number of viral copies(<138 copies/mL). A negative result must be combined with clinical observations, patient history, and epidemiological information. The expected result is Negative.  Fact Sheet for Patients:  BloggerCourse.com  Fact Sheet for Healthcare Providers:  SeriousBroker.it  This test is no t yet approved or cleared by the [place] FDA and  has been authorized for detection and/or diagnosis of SARS-CoV-2 by FDA under an Emergency Use Authorization (EUA). This EUA will remain  in effect (meaning this test can be used) for the  duration of the COVID-19 declaration under Section 564(b)(1) of the Act, 21 U.S.C.section 360bbb-3(b)(1), unless the authorization is terminated  or revoked sooner.       Influenza A by PCR POSITIVE (A) NEGATIVE Final   Influenza B by PCR NEGATIVE NEGATIVE Final    Comment: (NOTE) The Xpert Xpress SARS-CoV-2/FLU/RSV plus assay is intended as an aid in the diagnosis of influenza from Nasopharyngeal swab specimens and should not be used as a sole basis for treatment. Nasal washings and aspirates are unacceptable for Xpert Xpress SARS-CoV-2/FLU/RSV testing.  Fact Sheet for Patients: BloggerCourse.com  Fact Sheet for Healthcare Providers: SeriousBroker.it  This test is not yet approved or cleared by the [place] FDA and has been authorized for detection and/or diagnosis of SARS-CoV-2 by FDA under an Emergency Use Authorization (EUA). This EUA will remain in effect (meaning this test can be used) for the duration of the COVID-19 declaration under Section 564(b)(1) of the Act, 21 U.S.C. section 360bbb-3(b)(1), unless the authorization is terminated or revoked.     Resp Syncytial Virus by PCR NEGATIVE NEGATIVE Final    Comment: (NOTE) Fact Sheet for Patients: BloggerCourse.com  Fact Sheet for Healthcare Providers: SeriousBroker.it  This test is not yet approved or cleared by the [place] FDA and has been authorized for detection and/or diagnosis of SARS-CoV-2 by FDA under an Emergency Use Authorization (EUA). This EUA will remain in effect (meaning this test can be used) for the duration of the COVID-19 declaration under Section 564(b)(1) of the Act, 21 U.S.C. section 360bbb-3(b)(1), unless the authorization is terminated or revoked.  Performed at [facility], [address] [place], [place] [address]   Blood culture (routine x 2)     Status: None  (Preliminary result)   Collection Time: 11/24/23 12:12 PM   Specimen: BLOOD RIGHT HAND  Result Value Ref Range Status   Specimen Description BLOOD RIGHT HAND  Final   Special Requests   Final    BOTTLES DRAWN AEROBIC ONLY Blood Culture results may not be optimal due to an inadequate volume of blood received in culture bottles   Culture   Final    NO  GROWTH 2 DAYS Performed at [facility], [address] [place], [place] [address]    Report Status PENDING  Incomplete  Blood culture (routine x 2)     Status: None (Preliminary result)   Collection Time: 11/24/23  2:12 PM   Specimen: BLOOD  Result Value Ref Range Status   Specimen Description BLOOD SITE NOT SPECIFIED  Final   Special Requests   Final    AEROBIC BOTTLE ONLY Blood Culture results may not be optimal due to an inadequate volume of blood received in culture bottles   Culture   Final    NO GROWTH 2 DAYS Performed at [facility], [address], [place], [place] [address]    Report Status PENDING  Incomplete  MRSA Next Gen by PCR, Nasal     Status: None   Collection Time: 11/24/23  6:18 PM   Specimen: Nasal Mucosa; Nasal Swab  Result Value Ref Range Status   MRSA by PCR Next Gen NOT DETECTED NOT DETECTED Final    Comment: (NOTE) The GeneXpert MRSA Assay (FDA approved for NASAL specimens only), is one component of a comprehensive MRSA colonization surveillance program. It is not intended to diagnose MRSA infection nor to guide or monitor treatment for MRSA infections. Test performance is not FDA approved in patients less than 74 years old. Performed at [facility], [address], [place], [place] [address]          Radiology Studies: ECHOCARDIOGRAM COMPLETE Result Date: 11/25/2023    ECHOCARDIOGRAM REPORT   Patient Name:   Jorge [name] Date of Exam: 11/25/2023 Medical Rec #:  [id number]        Height:       72.0 in Accession #:    [id number]       Weight:       131.8 lb Date of  Birth:  [birth year]        BSA:          1.785 m Patient Age:    72 years         BP:           144/68 mmHg Patient Gender: M                HR:           73 bpm. Exam Location:  Inpatient Procedure: 2D Echo, Cardiac Doppler and Color Doppler Indications:    Ventricular tachycardia  History:        Patient has no prior history of Echocardiogram examinations.  Sonographer:    Vern Claude Referring Phys: [id number] HERSH GOEL IMPRESSIONS  1. Left ventricular ejection fraction, by estimation, is 60 to 65%. The left ventricle has normal function. The left ventricle has no regional wall motion abnormalities. Left ventricular diastolic parameters were normal.  2. Right ventricular systolic function is normal. The right ventricular size is normal. There is normal pulmonary artery systolic pressure. The estimated right ventricular systolic pressure is 27.8 mmHg.  3. The mitral valve is normal in structure. No evidence of mitral valve regurgitation. No evidence of mitral stenosis.  4. The aortic valve is grossly normal. Aortic valve regurgitation is not visualized. No aortic stenosis is present.  5. The inferior vena cava is normal in size with greater than 50% respiratory variability, suggesting right atrial pressure of 3 mmHg. FINDINGS  Left Ventricle: Left ventricular ejection fraction, by estimation, is 60 to 65%. The left ventricle has normal function. The left  ventricle has no regional wall motion abnormalities. The left ventricular internal cavity size was normal in size. There is  no left ventricular hypertrophy. Left ventricular diastolic parameters were normal. Right Ventricle: The right ventricular size is normal. No increase in right ventricular wall thickness. Right ventricular systolic function is normal. There is normal pulmonary artery systolic pressure. The tricuspid regurgitant velocity is 2.49 m/s, and  with an assumed right atrial pressure of 3 mmHg, the estimated right ventricular systolic pressure is 27.8  mmHg. Left Atrium: Left atrial size was normal in size. Right Atrium: Right atrial size was normal in size. Pericardium: There is no evidence of pericardial effusion. Mitral Valve: The mitral valve is normal in structure. No evidence of mitral valve regurgitation. No evidence of mitral valve stenosis. MV peak gradient, 3.3 mmHg. The mean mitral valve gradient is 1.0 mmHg. Tricuspid Valve: The tricuspid valve is normal in structure. Tricuspid valve regurgitation is mild . No evidence of tricuspid stenosis. Aortic Valve: The aortic valve is grossly normal. Aortic valve regurgitation is not visualized. No aortic stenosis is present. Aortic valve mean gradient measures 4.0 mmHg. Aortic valve peak gradient measures 7.0 mmHg. Aortic valve area, by VTI measures 3.10 cm. Pulmonic Valve: The pulmonic valve was normal in structure. Pulmonic valve regurgitation is not visualized. No evidence of pulmonic stenosis. Aorta: The aortic root is normal in size and structure. Venous: The inferior vena cava is normal in size with greater than 50% respiratory variability, suggesting right atrial pressure of 3 mmHg. IAS/Shunts: No atrial level shunt detected by color flow Doppler.  LEFT VENTRICLE PLAX 2D LVIDd:         5.20 cm      Diastology LVIDs:         3.50 cm      LV e' medial:    5.98 cm/s LV PW:         0.70 cm      LV E/e' medial:  13.0 LV IVS:        0.70 cm      LV e' lateral:   10.10 cm/s LVOT diam:     2.10 cm      LV E/e' lateral: 7.7 LV SV:         74 LV SV Index:   42 LVOT Area:     3.46 cm  LV Volumes (MOD) LV vol d, MOD A2C: 97.3 ml LV vol d, MOD A4C: 148.0 ml LV vol s, MOD A2C: 37.3 ml LV vol s, MOD A4C: 51.1 ml LV SV MOD A2C:     60.0 ml LV SV MOD A4C:     148.0 ml LV SV MOD BP:      79.4 ml RIGHT VENTRICLE             IVC RV Basal diam:  3.20 cm     IVC diam: 1.30 cm RV Mid diam:    3.10 cm RV S prime:     13.10 cm/s TAPSE (M-mode): 1.9 cm LEFT ATRIUM             Index        RIGHT ATRIUM          Index LA diam:         2.50 cm 1.40 cm/m   RA Area:     7.31 cm LA Vol (A2C):   16.3 ml 9.13 ml/m   RA Volume:   13.40 ml 7.51 ml/m LA Vol (A4C):   37.4 ml 20.96  ml/m LA Biplane Vol: 24.6 ml 13.78 ml/m  AORTIC VALVE                    PULMONIC VALVE AV Area (Vmax):    3.07 cm     PV Vmax:       0.66 m/s AV Area (Vmean):   2.66 cm     PV Peak grad:  1.7 mmHg AV Area (VTI):     3.10 cm AV Vmax:           132.00 cm/s AV Vmean:          89.500 cm/s AV VTI:            0.240 m AV Peak Grad:      7.0 mmHg AV Mean Grad:      4.0 mmHg LVOT Vmax:         117.00 cm/s LVOT Vmean:        68.800 cm/s LVOT VTI:          0.215 m LVOT/AV VTI ratio: 0.90  AORTA Ao Root diam: 3.10 cm Ao Asc diam:  3.10 cm MITRAL VALVE               TRICUSPID VALVE MV Area (PHT): 2.95 cm    TR Peak grad:   24.8 mmHg MV Area VTI:   3.02 cm    TR Vmax:        249.00 cm/s MV Peak grad:  3.3 mmHg MV Mean grad:  1.0 mmHg    SHUNTS MV Vmax:       0.90 m/s    Systemic VTI:  0.22 m MV Vmean:      56.5 cm/s   Systemic Diam: 2.10 cm MV Decel Time: 257 msec MV E velocity: 77.90 cm/s MV A velocity: 86.80 cm/s MV E/A ratio:  0.90 Mihai Croitoru MD Electronically signed by Thurmon Fair MD Signature Date/Time: 11/25/2023/8:51:57 AM    Final    DG Abd 1 View Result Date: 11/24/2023 CLINICAL DATA:  Altered mental status, rectal pain. EXAM: ABDOMEN - 1 VIEW COMPARISON:  CT abdomen pelvis dated 10/09/2023. FINDINGS: The bowel gas pattern is normal. No calculi overlying the expected course of the urinary tract. Degenerative changes are seen in the spine and hips. Excreted contrast is seen in the bladder. IMPRESSION: Nonobstructive bowel gas pattern. Electronically Signed   By: Romona Curls M.D.   On: 11/24/2023 16:21   CT Angio Chest PE W and/or Wo Contrast Result Date: 11/24/2023 CLINICAL DATA:  PE suspected. High probability. History of colorectal carcinoma. * Tracking Code: BO * EXAM: CT ANGIOGRAPHY CHEST WITH CONTRAST TECHNIQUE: Multidetector CT imaging of the chest  was performed using the standard protocol during bolus administration of intravenous contrast. Multiplanar CT image reconstructions and MIPs were obtained to evaluate the vascular anatomy. RADIATION DOSE REDUCTION: This exam was performed according to the departmental dose-optimization program which includes automated exposure control, adjustment of the mA and/or kV according to patient size and/or use of iterative reconstruction technique. CONTRAST:  75mL OMNIPAQUE IOHEXOL 350 MG/ML SOLN COMPARISON:  10/09/2023. FINDINGS: Cardiovascular: Satisfactory opacification of the pulmonary arteries to the segmental level. No evidence of pulmonary embolism. Normal heart size. No pericardial effusion. Coronary artery calcifications. Mediastinum/Nodes: Thyroid gland, trachea, and esophagus demonstrate no significant findings. Subcarinal lymph node measures 1.1 cm, image 87/4. This is new from previous exam. Enlarged right hilar lymph node is also new measuring 1.9 cm, image 85/4. Lungs/Pleura: Emphysema. Diffuse bronchial wall thickening. No  pleural effusion, airspace consolidation, atelectasis or pneumothorax. Multifocal pulmonary nodules are again noted. Index nodule within the right upper lobe measures 2.4 cm, image 71/12. This is compared with 2.5 cm previously. Index nodule within the right middle lobe measures 2.8 cm, image 96/12. Also unchanged from previous exam. Index nodule within the anterior right apex measures 2 cm, image 39/12. SINCE THE PREVIOUS EXAM THERE IS BEEN INTERVAL DEVELOPMENT OF EXTENSIVE IRREGULAR AND ILL-DEFINED IRREGULAR PERIBRONCHOVASCULAR NODULAR DENSITIES THROUGHOUT BOTH LOWER LOBES, RIGHT MIDDLE LOBE AND RIGHT UPPER LOBE.  MANY OF THESE APPEAR TO HAVE A CAVITARY COMPONENT.  INDEX NODULES INCLUDE: SINCE THE PREVIOUS EXAM THERE IS BEEN INTERVAL DEVELOPMENT OF EXTENSIVE IRREGULAR AND ILL-DEFINED IRREGULAR PERIBRONCHOVASCULAR NODULAR DENSITIES THROUGHOUT BOTH LOWER LOBES, RIGHT MIDDLE LOBE AND RIGHT  UPPER LOBE. MANY OF THESE APPEAR TO HAVE A CAVITARY COMPONENT. INDEX NODULES INCLUDE -nodule within the lateral right lower lobe measures 1.2 cm, image 106/2. -Nodule in the posterior right lower lobe measures 1 cm, image 112/2. -nodule within the right upper lobe measures 0.9 cm, image 56/12. Upper Abdomen: No acute abnormality within the imaged portions of the upper abdomen. Sub optimal visualization of previously noted scattered low-density liver lesions due to phase of contrast opacification. Gallbladder appears distended. Small amount of perihepatic ascites overlies the right hepatic lobe. Musculoskeletal: No chest wall abnormality. No acute or significant osseous findings. Review of the MIP images confirms the above findings. IMPRESSION: 1. No evidence for acute pulmonary embolus. 2. Interval development of extensive irregular and ill-defined irregular peribronchovascular nodular densities throughout both lower lobes, right middle lobe and right upper lobe. Associated diffuse bronchial wall thickening is identified throughout both lungs. Findings are favored to represent atypical infection such as fungal or atypical mycobacterial infection. Metastatic disease is considered less favored. 3. No significant change in the appearance of previously noted pulmonary metastasis. 4. New mediastinal and right hilar lymphadenopathy. In the acute setting this may reflect reactive adenopathy secondary to infection. Nodal metastasis is considered less favored but cannot be excluded with a high degree of certainty. 5. Coronary artery calcifications. 6. Small amount of perihepatic ascites overlies the right hepatic lobe. 7.  Emphysema (ICD10-J43.9). Electronically Signed   By: Signa Kell M.D.   On: 11/24/2023 14:33   CT Head Wo Contrast Result Date: 11/24/2023 CLINICAL DATA:  Altered mental status. Incontinence and decreased urine output. Intermittent rectal pain. EXAM: CT HEAD WITHOUT CONTRAST TECHNIQUE: Contiguous  axial images were obtained from the base of the skull through the vertex without intravenous contrast. RADIATION DOSE REDUCTION: This exam was performed according to the departmental dose-optimization program which includes automated exposure control, adjustment of the mA and/or kV according to patient size and/or use of iterative reconstruction technique. COMPARISON:  None Available. FINDINGS: Brain: Ventricles, cisterns and other CSF spaces are normal. There is no mass, mass effect, shift of midline structures or acute hemorrhage. No evidence of acute infarction. Vascular: No hyperdense vessel or unexpected calcification. Skull: Normal. Negative for fracture or focal lesion. Sinuses/Orbits: Orbits are normal. Mild to moderate chronic inflammatory change of the paranasal sinuses predominately involving the maxillary and ethmoid sinuses. Other: None. IMPRESSION: 1. No acute intracranial findings. 2. Mild to moderate chronic inflammatory change of the paranasal sinuses. Electronically Signed   By: Elberta Fortis M.D.   On: 11/24/2023 14:26           LOS: 2 days   Time spent= 35 mins    Sheli Dorin Zoila Shutter, MD [facility]  If 7PM-7AM, please contact night-coverage  11/26/2023, 12:00 PM

## 2023-11-26 NOTE — Plan of Care (Signed)
  Problem: Education: Goal: Knowledge of General Education information will improve Description: Including pain rating scale, medication(s)/side effects and non-pharmacologic comfort measures Outcome: Progressing   Problem: Clinical Measurements: Goal: Diagnostic test results will improve Outcome: Progressing Goal: Respiratory complications will improve Outcome: Progressing Goal: Cardiovascular complication will be avoided Outcome: Progressing   Problem: Activity: Goal: Risk for activity intolerance will decrease Outcome: Progressing   Problem: Pain Managment: Goal: General experience of comfort will improve and/or be controlled Outcome: Progressing   Problem: Safety: Goal: Ability to remain free from injury will improve Outcome: Progressing

## 2023-11-26 NOTE — Telephone Encounter (Signed)
Oral Oncology Patient Advocate Encounter  Attempted to call and check status of PAP application. Program closed for the holiday. Will attempt again tomorrow.  Jinger Neighbors, CPhT-Adv Oncology Pharmacy Patient Advocate [facility] Direct Number: [phone]  Fax: [phone]

## 2023-11-26 NOTE — Evaluation (Signed)
Physical Therapy Evaluation Patient Details Name: Jorge [name] MRN: [id number] DOB: [birth year] Today's Date: 11/26/2023  History of Present Illness  73 year old male brought to Ed 11/24/23 with AMS, abdominal pain,.  Upon admission he was noted to be tachycardic and hypotensive.  EKG had showed wide-complex tachycardia therefore cardioversion was performed by the ER provider. GMW:NUUVOZ metastatic cancer and chemotherapy, anemia.  Clinical Impression  Pt admitted with above diagnosis.  Pt currently with functional limitations due to the deficits listed below (see PT Problem List). Pt will benefit from acute skilled PT to increase their independence and safety with mobility to allow discharge.     The patient moaning and restless in bed stating abdominal pain. Patient unable to give clear picture of prior level of function nor if daughter is available to assist. Patient assisted to  stand and step to recliner, reports that he has ambulated to [facility] with staff.  Continue PT and assess  needs at DC once caregiver availability is established. Patient will benefit from continued inpatient follow up therapy, <3 hours/day if  does not have support.        If plan is discharge home, recommend the following: Assistance with cooking/housework;Assist for transportation;A lot of help with walking and/or transfers;Help with stairs or ramp for entrance   Can travel by private vehicle   Yes    Equipment Recommendations Rolling walker (2 wheels)  Recommendations for Other Services       Functional Status Assessment Patient has had a recent decline in their functional status and demonstrates the ability to make significant improvements in function in a reasonable and predictable amount of time.     Precautions / Restrictions Precautions Precautions: Fall Precaution Comments: Rport a cath Restrictions Weight Bearing Restrictions Per Provider Order: No      Mobility  Bed Mobility Overal bed  mobility: Needs Assistance Bed Mobility: Supine to Sit     Supine to sit: Supervision          Transfers Overall transfer level: Needs assistance Equipment used: None Transfers: Sit to/from Stand, Bed to chair/wheelchair/BSC Sit to Stand: Contact guard assist   Step pivot transfers: Contact guard assist       General transfer comment: step pivot to recliner, cues for safety, impulsive    Ambulation/Gait                  Stairs            Wheelchair Mobility     Tilt Bed    Modified Rankin (Stroke Patients Only)       Balance Overall balance assessment: Mild deficits observed, not formally tested                                           Pertinent Vitals/Pain Pain Assessment Pain Assessment: Faces Faces Pain Scale: Hurts whole lot Pain Location: abdomen Pain Descriptors / Indicators: Aching, Discomfort, Moaning, Cramping Pain Intervention(s): Monitored during session, Repositioned, Limited activity within patient's tolerance    Home Living Family/patient expects to be discharged to:: Private residence Living Arrangements: Children Available Help at Discharge: Family;Available PRN/intermittently Type of Home: Apartment           Home Equipment: Other (comment) (unsure) Additional Comments: patient  unable to answer home questions, no family present    Prior Function Prior Level of Function : Needs assist  Mobility Comments: pt reports ambulatory       Extremity/Trunk Assessment        Lower Extremity Assessment Lower Extremity Assessment: Generalized weakness    Cervical / Trunk Assessment Cervical / Trunk Assessment: Normal  Communication   Communication Communication: No apparent difficulties  Cognition Arousal: Alert Behavior During Therapy: Restless, Flat affect Overall Cognitive Status: Difficult to assess                                 General Comments: patient  asking for help but unclear as to desires. needed increased cues for naming this place. "[place] chemo therapy"        General Comments      Exercises     Assessment/Plan    PT Assessment Patient needs continued PT services  PT Problem List         PT Treatment Interventions DME instruction;Therapeutic activities;Cognitive remediation;Gait training;Therapeutic exercise;Patient/family education;Functional mobility training    PT Goals (Current goals can be found in the Care Plan section)  Acute Rehab PT Goals PT Goal Formulation: Patient unable to participate in goal setting Time For Goal Achievement: 12/10/23 Potential to Achieve Goals: Fair    Frequency Min 1X/week     Co-evaluation PT/OT/SLP Co-Evaluation/Treatment: Yes Reason for Co-Treatment: To address functional/ADL transfers;For patient/therapist safety PT goals addressed during session: Mobility/safety with mobility         AM-PAC PT "6 Clicks" Mobility  Outcome Measure Help needed turning from your back to your side while in a flat bed without using bedrails?: None Help needed moving from lying on your back to sitting on the side of a flat bed without using bedrails?: None Help needed moving to and from a bed to a chair (including a wheelchair)?: None Help needed standing up from a chair using your arms (e.g., wheelchair or bedside chair)?: A Little Help needed to walk in hospital room?: A Lot Help needed climbing 3-5 steps with a railing? : Total 6 Click Score: 18    End of Session   Activity Tolerance: Patient limited by fatigue;Patient limited by pain Patient left: in chair;with call bell/phone within reach;with chair alarm set Nurse Communication: Mobility status PT Visit Diagnosis: Unsteadiness on feet (R26.81);Muscle weakness (generalized) (M62.81);Difficulty in walking, not elsewhere classified (R26.2);Pain    Time: 1457-1510 PT Time Calculation (min) (ACUTE ONLY): 13 min   Charges:   PT  Evaluation $PT Eval Low Complexity: 1 Low   PT General Charges $$ ACUTE PT VISIT: 1 Visit         [name] PT Acute Rehabilitation Services Office [phone] Weekend pager-[phone]   [name] 11/26/2023, 3:42 PM

## 2023-11-26 NOTE — Progress Notes (Signed)
Cardiologist:  Croitoru  Subjective:  Denies SSCP, palpitations or Dyspnea Wants to go home  Objective:  Vitals:   11/26/23 0520 11/26/23 0600 11/26/23 0621 11/26/23 0800  BP:   (!) 141/77 137/80  Pulse: 60  61   Resp: (!) 25 (!) 24 (!) 28 (!) 26  Temp:    (!) 97.2 F (36.2 C)  TempSrc:    Axillary  SpO2: 97% 96% 96% 96%  Weight:      Height:        Intake/Output from previous day:  Intake/Output Summary (Last 24 hours) at 11/26/2023 1024 Last data filed at 11/26/2023 0859 Gross per 24 hour  Intake 146.04 ml  Output 1200 ml  Net -1053.96 ml    Physical Exam: Chronically ill black male  Port on right subclavian No murmur  Lungs clear Abdomen benign No edema  Lab Results: Basic Metabolic Panel: Recent Labs    11/24/23 1140 11/25/23 0853 11/26/23 0620  NA  --  132* 133*  K  --  3.5 3.3*  CL  --  96* 99  CO2  --  26 27  GLUCOSE  --  94 97  BUN  --  12 10  CREATININE  --  0.93 0.95  CALCIUM  --  8.1* 7.9*  MG 2.4  --  2.3  PHOS  --   --  3.4   Liver Function Tests: Recent Labs    11/25/23 0853 11/26/23 0620  AST 34 26  ALT 27 23  ALKPHOS 98 96  BILITOT 1.3* 0.8  PROT 6.1* 6.0*  ALBUMIN 2.4* 2.3*   No results for input(s): "LIPASE", "AMYLASE" in the last 72 hours. CBC: Recent Labs    11/24/23 1124 11/25/23 0853 11/26/23 0620  WBC 11.5* 5.9 5.5  NEUTROABS 8.9*  --   --   HGB 13.4 11.7* 11.4*  HCT 41.8 36.5* 36.4*  MCV 88.2 87.7 88.8  PLT 329 282 284    Thyroid Function Tests: Recent Labs    11/24/23 1143  TSH 2.612   Anemia Panel: Recent Labs    11/25/23 0853 11/26/23 0620  VITAMINB12 3,003*  --   FOLATE  --  23.9    Imaging: ECHOCARDIOGRAM COMPLETE Result Date: 11/25/2023    ECHOCARDIOGRAM REPORT   Patient Name:   MELQUIADES AGRICOLA Date of Exam: 11/25/2023 Medical Rec #:  [id number]        Height:       72.0 in Accession #:    [id number]       Weight:       131.8 lb Date of Birth:  1951-04-07        BSA:          1.785 m  Patient Age:    72 years         BP:           144/68 mmHg Patient Gender: M                HR:           73 bpm. Exam Location:  Inpatient Procedure: 2D Echo, Cardiac Doppler and Color Doppler Indications:    Ventricular tachycardia  History:        Patient has no prior history of Echocardiogram examinations.  Sonographer:    Vern Claude Referring Phys: [id number] HERSH GOEL IMPRESSIONS  1. Left ventricular ejection fraction, by estimation, is 60 to 65%. The left ventricle has normal function. The left ventricle has no regional  wall motion abnormalities. Left ventricular diastolic parameters were normal.  2. Right ventricular systolic function is normal. The right ventricular size is normal. There is normal pulmonary artery systolic pressure. The estimated right ventricular systolic pressure is 27.8 mmHg.  3. The mitral valve is normal in structure. No evidence of mitral valve regurgitation. No evidence of mitral stenosis.  4. The aortic valve is grossly normal. Aortic valve regurgitation is not visualized. No aortic stenosis is present.  5. The inferior vena cava is normal in size with greater than 50% respiratory variability, suggesting right atrial pressure of 3 mmHg. FINDINGS  Left Ventricle: Left ventricular ejection fraction, by estimation, is 60 to 65%. The left ventricle has normal function. The left ventricle has no regional wall motion abnormalities. The left ventricular internal cavity size was normal in size. There is  no left ventricular hypertrophy. Left ventricular diastolic parameters were normal. Right Ventricle: The right ventricular size is normal. No increase in right ventricular wall thickness. Right ventricular systolic function is normal. There is normal pulmonary artery systolic pressure. The tricuspid regurgitant velocity is 2.49 m/s, and  with an assumed right atrial pressure of 3 mmHg, the estimated right ventricular systolic pressure is 27.8 mmHg. Left Atrium: Left atrial size was normal  in size. Right Atrium: Right atrial size was normal in size. Pericardium: There is no evidence of pericardial effusion. Mitral Valve: The mitral valve is normal in structure. No evidence of mitral valve regurgitation. No evidence of mitral valve stenosis. MV peak gradient, 3.3 mmHg. The mean mitral valve gradient is 1.0 mmHg. Tricuspid Valve: The tricuspid valve is normal in structure. Tricuspid valve regurgitation is mild . No evidence of tricuspid stenosis. Aortic Valve: The aortic valve is grossly normal. Aortic valve regurgitation is not visualized. No aortic stenosis is present. Aortic valve mean gradient measures 4.0 mmHg. Aortic valve peak gradient measures 7.0 mmHg. Aortic valve area, by VTI measures 3.10 cm. Pulmonic Valve: The pulmonic valve was normal in structure. Pulmonic valve regurgitation is not visualized. No evidence of pulmonic stenosis. Aorta: The aortic root is normal in size and structure. Venous: The inferior vena cava is normal in size with greater than 50% respiratory variability, suggesting right atrial pressure of 3 mmHg. IAS/Shunts: No atrial level shunt detected by color flow Doppler.  LEFT VENTRICLE PLAX 2D LVIDd:         5.20 cm      Diastology LVIDs:         3.50 cm      LV e' medial:    5.98 cm/s LV PW:         0.70 cm      LV E/e' medial:  13.0 LV IVS:        0.70 cm      LV e' lateral:   10.10 cm/s LVOT diam:     2.10 cm      LV E/e' lateral: 7.7 LV SV:         74 LV SV Index:   42 LVOT Area:     3.46 cm  LV Volumes (MOD) LV vol d, MOD A2C: 97.3 ml LV vol d, MOD A4C: 148.0 ml LV vol s, MOD A2C: 37.3 ml LV vol s, MOD A4C: 51.1 ml LV SV MOD A2C:     60.0 ml LV SV MOD A4C:     148.0 ml LV SV MOD BP:      79.4 ml RIGHT VENTRICLE  IVC RV Basal diam:  3.20 cm     IVC diam: 1.30 cm RV Mid diam:    3.10 cm RV S prime:     13.10 cm/s TAPSE (M-mode): 1.9 cm LEFT ATRIUM             Index        RIGHT ATRIUM          Index LA diam:        2.50 cm 1.40 cm/m   RA Area:     7.31 cm  LA Vol (A2C):   16.3 ml 9.13 ml/m   RA Volume:   13.40 ml 7.51 ml/m LA Vol (A4C):   37.4 ml 20.96 ml/m LA Biplane Vol: 24.6 ml 13.78 ml/m  AORTIC VALVE                    PULMONIC VALVE AV Area (Vmax):    3.07 cm     PV Vmax:       0.66 m/s AV Area (Vmean):   2.66 cm     PV Peak grad:  1.7 mmHg AV Area (VTI):     3.10 cm AV Vmax:           132.00 cm/s AV Vmean:          89.500 cm/s AV VTI:            0.240 m AV Peak Grad:      7.0 mmHg AV Mean Grad:      4.0 mmHg LVOT Vmax:         117.00 cm/s LVOT Vmean:        68.800 cm/s LVOT VTI:          0.215 m LVOT/AV VTI ratio: 0.90  AORTA Ao Root diam: 3.10 cm Ao Asc diam:  3.10 cm MITRAL VALVE               TRICUSPID VALVE MV Area (PHT): 2.95 cm    TR Peak grad:   24.8 mmHg MV Area VTI:   3.02 cm    TR Vmax:        249.00 cm/s MV Peak grad:  3.3 mmHg MV Mean grad:  1.0 mmHg    SHUNTS MV Vmax:       0.90 m/s    Systemic VTI:  0.22 m MV Vmean:      56.5 cm/s   Systemic Diam: 2.10 cm MV Decel Time: 257 msec MV E velocity: 77.90 cm/s MV A velocity: 86.80 cm/s MV E/A ratio:  0.90 Mihai Croitoru MD Electronically signed by Thurmon Fair MD Signature Date/Time: 11/25/2023/8:51:57 AM    Final    DG Abd 1 View Result Date: 11/24/2023 CLINICAL DATA:  Altered mental status, rectal pain. EXAM: ABDOMEN - 1 VIEW COMPARISON:  CT abdomen pelvis dated 10/09/2023. FINDINGS: The bowel gas pattern is normal. No calculi overlying the expected course of the urinary tract. Degenerative changes are seen in the spine and hips. Excreted contrast is seen in the bladder. IMPRESSION: Nonobstructive bowel gas pattern. Electronically Signed   By: Romona Curls M.D.   On: 11/24/2023 16:21   CT Angio Chest PE W and/or Wo Contrast Result Date: 11/24/2023 CLINICAL DATA:  PE suspected. High probability. History of colorectal carcinoma. * Tracking Code: BO * EXAM: CT ANGIOGRAPHY CHEST WITH CONTRAST TECHNIQUE: Multidetector CT imaging of the chest was performed using the standard protocol during  bolus administration of intravenous contrast. Multiplanar CT image reconstructions and MIPs were  obtained to evaluate the vascular anatomy. RADIATION DOSE REDUCTION: This exam was performed according to the departmental dose-optimization program which includes automated exposure control, adjustment of the mA and/or kV according to patient size and/or use of iterative reconstruction technique. CONTRAST:  75mL OMNIPAQUE IOHEXOL 350 MG/ML SOLN COMPARISON:  10/09/2023. FINDINGS: Cardiovascular: Satisfactory opacification of the pulmonary arteries to the segmental level. No evidence of pulmonary embolism. Normal heart size. No pericardial effusion. Coronary artery calcifications. Mediastinum/Nodes: Thyroid gland, trachea, and esophagus demonstrate no significant findings. Subcarinal lymph node measures 1.1 cm, image 87/4. This is new from previous exam. Enlarged right hilar lymph node is also new measuring 1.9 cm, image 85/4. Lungs/Pleura: Emphysema. Diffuse bronchial wall thickening. No pleural effusion, airspace consolidation, atelectasis or pneumothorax. Multifocal pulmonary nodules are again noted. Index nodule within the right upper lobe measures 2.4 cm, image 71/12. This is compared with 2.5 cm previously. Index nodule within the right middle lobe measures 2.8 cm, image 96/12. Also unchanged from previous exam. Index nodule within the anterior right apex measures 2 cm, image 39/12. SINCE THE PREVIOUS EXAM THERE IS BEEN INTERVAL DEVELOPMENT OF EXTENSIVE IRREGULAR AND ILL-DEFINED IRREGULAR PERIBRONCHOVASCULAR NODULAR DENSITIES THROUGHOUT BOTH LOWER LOBES, RIGHT MIDDLE LOBE AND RIGHT UPPER LOBE.  MANY OF THESE APPEAR TO HAVE A CAVITARY COMPONENT.  INDEX NODULES INCLUDE: SINCE THE PREVIOUS EXAM THERE IS BEEN INTERVAL DEVELOPMENT OF EXTENSIVE IRREGULAR AND ILL-DEFINED IRREGULAR PERIBRONCHOVASCULAR NODULAR DENSITIES THROUGHOUT BOTH LOWER LOBES, RIGHT MIDDLE LOBE AND RIGHT UPPER LOBE. MANY OF THESE APPEAR TO HAVE A  CAVITARY COMPONENT. INDEX NODULES INCLUDE -nodule within the lateral right lower lobe measures 1.2 cm, image 106/2. -Nodule in the posterior right lower lobe measures 1 cm, image 112/2. -nodule within the right upper lobe measures 0.9 cm, image 56/12. Upper Abdomen: No acute abnormality within the imaged portions of the upper abdomen. Sub optimal visualization of previously noted scattered low-density liver lesions due to phase of contrast opacification. Gallbladder appears distended. Small amount of perihepatic ascites overlies the right hepatic lobe. Musculoskeletal: No chest wall abnormality. No acute or significant osseous findings. Review of the MIP images confirms the above findings. IMPRESSION: 1. No evidence for acute pulmonary embolus. 2. Interval development of extensive irregular and ill-defined irregular peribronchovascular nodular densities throughout both lower lobes, right middle lobe and right upper lobe. Associated diffuse bronchial wall thickening is identified throughout both lungs. Findings are favored to represent atypical infection such as fungal or atypical mycobacterial infection. Metastatic disease is considered less favored. 3. No significant change in the appearance of previously noted pulmonary metastasis. 4. New mediastinal and right hilar lymphadenopathy. In the acute setting this may reflect reactive adenopathy secondary to infection. Nodal metastasis is considered less favored but cannot be excluded with a high degree of certainty. 5. Coronary artery calcifications. 6. Small amount of perihepatic ascites overlies the right hepatic lobe. 7.  Emphysema (ICD10-J43.9). Electronically Signed   By: Signa Kell M.D.   On: 11/24/2023 14:33   CT Head Wo Contrast Result Date: 11/24/2023 CLINICAL DATA:  Altered mental status. Incontinence and decreased urine output. Intermittent rectal pain. EXAM: CT HEAD WITHOUT CONTRAST TECHNIQUE: Contiguous axial images were obtained from the base of  the skull through the vertex without intravenous contrast. RADIATION DOSE REDUCTION: This exam was performed according to the departmental dose-optimization program which includes automated exposure control, adjustment of the mA and/or kV according to patient size and/or use of iterative reconstruction technique. COMPARISON:  None Available. FINDINGS: Brain: Ventricles, cisterns and other CSF spaces are normal.  There is no mass, mass effect, shift of midline structures or acute hemorrhage. No evidence of acute infarction. Vascular: No hyperdense vessel or unexpected calcification. Skull: Normal. Negative for fracture or focal lesion. Sinuses/Orbits: Orbits are normal. Mild to moderate chronic inflammatory change of the paranasal sinuses predominately involving the maxillary and ethmoid sinuses. Other: None. IMPRESSION: 1. No acute intracranial findings. 2. Mild to moderate chronic inflammatory change of the paranasal sinuses. Electronically Signed   By: Elberta Fortis M.D.   On: 11/24/2023 14:26    Cardiac Studies:  ECG: SR PAC normal QT Telemetry:  NSR PAD/PVC some aberrancy  Echo: EF normal 60-65%   Medications:    [START ON 11/27/2023] amLODipine  5 mg Per Tube Daily   Chlorhexidine Gluconate Cloth  6 each Topical Daily   doxycycline  100 mg Per Tube Q12H   enoxaparin (LOVENOX) injection  40 mg Subcutaneous Q24H   gabapentin  200 mg Per Tube Q8H   lactulose  30 g Per Tube TID   metoprolol tartrate  25 mg Per Tube BID   oseltamivir  75 mg Per Tube BID   pantoprazole (PROTONIX) IV  40 mg Intravenous Q24H   potassium chloride  40 mEq Per Tube Once   rifaximin  550 mg Per Tube BID   simethicone  80 mg Oral Once      Assessment/Plan:   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 Not symptomatic EF normal by echo and no history of CAD or chest pain Improved continue low dose lopressor Mg normal supplement K HTN:  norvasc dose decreased since adding lopressor  Metastatic rectal cancer:  f/u  oncology continue outpatient chemo Cavitary Lung Lesions: ? Infection vs mets per primary service on rifaximin  No further cardiology w/u needed  Will sign off   Charlton Haws 11/26/2023, 10:24 AM

## 2023-11-26 NOTE — Progress Notes (Deleted)
PT Cancellation Note  Patient Details Name: Jorge [name] MRN: [id number] DOB: 05-Jun-1951   Cancelled Treatment:    Reason Eval/Treat Not Completed: Other (comment) Patient tp transfer to another room shortly.  Will see after settled in new room. [name] PT Acute Rehabilitation Services Office [phone] Weekend [phone]   [name] 11/26/2023, 9:52 AM

## 2023-11-27 ENCOUNTER — Other Ambulatory Visit (HOSPITAL_COMMUNITY): Payer: Self-pay

## 2023-11-27 ENCOUNTER — Encounter: Payer: Self-pay | Admitting: Hematology

## 2023-11-27 ENCOUNTER — Other Ambulatory Visit: Payer: Self-pay

## 2023-11-27 DIAGNOSIS — J189 Pneumonia, unspecified organism: Secondary | ICD-10-CM | POA: Diagnosis not present

## 2023-11-27 LAB — COMPREHENSIVE METABOLIC PANEL
ALT: 21 U/L (ref 0–44)
AST: 26 U/L (ref 15–41)
Albumin: 2.4 g/dL — ABNORMAL LOW (ref 3.5–5.0)
Alkaline Phosphatase: 124 U/L (ref 38–126)
Anion gap: 4 — ABNORMAL LOW (ref 5–15)
BUN: 10 mg/dL (ref 8–23)
CO2: 26 mmol/L (ref 22–32)
Calcium: 8.4 mg/dL — ABNORMAL LOW (ref 8.9–10.3)
Chloride: 105 mmol/L (ref 98–111)
Creatinine, Ser: 0.38 mg/dL — ABNORMAL LOW (ref 0.61–1.24)
GFR, Estimated: >60 mL/min (ref >60)
Glucose, Bld: 109 mg/dL — ABNORMAL HIGH (ref 70–99)
Potassium: 3.9 mmol/L (ref 3.5–5.1)
Sodium: 135 mmol/L (ref 135–145)
Total Bilirubin: 0.4 mg/dL (ref 0.0–1.2)
Total Protein: 6.2 g/dL — ABNORMAL LOW (ref 6.5–8.1)

## 2023-11-27 LAB — CBC
HCT: 35.6 % — ABNORMAL LOW (ref 39.0–52.0)
Hemoglobin: 11.1 g/dL — ABNORMAL LOW (ref 13.0–17.0)
MCH: 28.2 pg (ref 26.0–34.0)
MCHC: 31.2 g/dL (ref 30.0–36.0)
MCV: 90.4 fL (ref 80.0–100.0)
Platelets: 301 10*3/uL (ref 150–400)
RBC: 3.94 MIL/uL — ABNORMAL LOW (ref 4.22–5.81)
RDW: 16.2 % — ABNORMAL HIGH (ref 11.5–15.5)
WBC: 4.5 10*3/uL (ref 4.0–10.5)
nRBC: 0 % (ref 0.0–0.2)

## 2023-11-27 LAB — MAGNESIUM: Magnesium: 2.1 mg/dL (ref 1.7–2.4)

## 2023-11-27 MED ORDER — PANTOPRAZOLE SODIUM 40 MG PO TBEC
40.0000 mg | DELAYED_RELEASE_TABLET | Freq: Every day | ORAL | 0 refills | Status: DC
  Filled 2023-11-27: qty 30, 30d supply, fill #0

## 2023-11-27 MED ORDER — SODIUM CHLORIDE 0.9% FLUSH
10.0000 mL | Freq: Two times a day (BID) | INTRAVENOUS | Status: DC
  Administered 2023-11-27: 10 mL

## 2023-11-27 MED ORDER — HEPARIN SOD (PORK) LOCK FLUSH 100 UNIT/ML IV SOLN
500.0000 [IU] | INTRAVENOUS | Status: AC | PRN
  Administered 2023-11-27: 500 [IU]

## 2023-11-27 MED ORDER — OSELTAMIVIR PHOSPHATE 75 MG PO CAPS
75.0000 mg | ORAL_CAPSULE | Freq: Two times a day (BID) | ORAL | 0 refills | Status: AC
  Filled 2023-11-27: qty 4, 2d supply, fill #0

## 2023-11-27 MED ORDER — METOPROLOL TARTRATE 25 MG PO TABS
25.0000 mg | ORAL_TABLET | Freq: Two times a day (BID) | ORAL | 0 refills | Status: DC
  Filled 2023-11-27: qty 60, 30d supply, fill #0

## 2023-11-27 MED ORDER — DOXYCYCLINE HYCLATE 100 MG PO TABS
100.0000 mg | ORAL_TABLET | Freq: Two times a day (BID) | ORAL | 0 refills | Status: AC
  Filled 2023-11-27: qty 2, 1d supply, fill #0

## 2023-11-27 MED ORDER — SODIUM CHLORIDE 0.9% FLUSH
10.0000 mL | INTRAVENOUS | Status: DC | PRN
  Administered 2023-11-27 (×2): 10 mL

## 2023-11-27 MED ORDER — OXYCODONE HCL 5 MG PO TABS: 5.0000 mg | ORAL_TABLET | ORAL | Status: DC | PRN

## 2023-11-27 NOTE — Plan of Care (Signed)
  Problem: Clinical Measurements: Goal: Ability to maintain clinical measurements within normal limits will improve Outcome: Progressing   Problem: Clinical Measurements: Goal: Will remain free from infection Outcome: Progressing   Problem: Clinical Measurements: Goal: Diagnostic test results will improve Outcome: Progressing   Problem: Clinical Measurements: Goal: Respiratory complications will improve Outcome: Progressing   

## 2023-11-27 NOTE — Progress Notes (Signed)
TOC meds in a secure bag delivered to pt in room by this RN

## 2023-11-27 NOTE — TOC Transition Note (Signed)
Transition of Care [facility]) - Discharge Note   Patient Details  Name: Jorge [name] MRN: [id number] Date of Birth: April 17, 1951  Transition of Care [facility]) CM/SW Contact:  Diona Browner, LCSW Phone Number: 11/27/2023, 1:01 PM   Clinical Narrative:    Pt recommended for rehab at SNF. Both pt and dtr declined SNF and requested HHPT/OT instead. HHPT/OT set up with [facility]. RW ordered through [employer] to be delivered to pt room prior to d/c. CSW assisted with scheduling appt for new PCP for pt. HH and PCP information added to pt AVS.   Final next level of care: Home w Home Health Services Barriers to Discharge: No Barriers Identified   Patient Goals and CMS Choice Patient states their goals for this hospitalization and ongoing recovery are:: To return to baseline or better CMS Medicare.gov Compare Post Acute Care list provided to:: Patient Choice offered to / list presented to : Patient, HC POA / Guardian [place] ownership interest in [facility].provided to::  (NA)    Discharge Placement                       Discharge Plan and Services Additional resources added to the After Visit Summary for                  DME Arranged: Walker rolling DME Agency: [employer] Date DME Agency Contacted: 11/27/23 Time DME Agency Contacted: 1230 Representative spoke with at DME Agency: Vaughan Basta HH Arranged: PT, OT, Refused SNF HH Agency: [facility] Date HH Agency Contacted: 11/27/23 Time HH Agency Contacted: 1100 Representative spoke with at [facility] Agency: Adele Dan  Social Drivers of Health (SDOH) Interventions SDOH Screenings   Food Insecurity: No Food Insecurity (11/24/2023)  Housing: Low Risk  (11/24/2023)  Transportation Needs: No Transportation Needs (11/24/2023)  Utilities: Not At Risk (11/24/2023)  Social Connections: Socially Isolated (11/24/2023)  Tobacco Use: High Risk (11/24/2023)     Readmission Risk Interventions    11/27/2023    12:54 PM  Readmission Risk Prevention Plan  Transportation Screening Complete  PCP or Specialist Appt within 3-5 Days Complete  HRI or Home Care Consult Complete  Social Work Consult for Recovery Care Planning/Counseling Complete  Palliative Care Screening Complete  Medication Review Oceanographer) Complete

## 2023-11-27 NOTE — Progress Notes (Signed)
Mobility Specialist - Progress Note   11/27/23 1333  Mobility  Activity Transferred from bed to chair  Level of Assistance Modified independent, requires aide device or extra time  Assistive Device None  Activity Response Tolerated well  Mobility Referral Yes  Mobility visit 1 Mobility  Mobility Specialist Start Time (ACUTE ONLY) 1324  Mobility Specialist Stop Time (ACUTE ONLY) 1332  Mobility Specialist Time Calculation (min) (ACUTE ONLY) 8 min   Pt received in bed and agreeable to transfer to the recliner. No complaints during session. Pt to recliner after session with all needs met. Chair alarm on.   [facility]

## 2023-11-27 NOTE — TOC CM/SW Note (Signed)
 [facility] Quality rating ???? Patient survey rating ????   [facility] Quality rating ????? Patient survey rating???   [facility] [phone] Quality rating???? Patient survey rating????   [facility] [phone] Quality rating???? Patient survey rating????   [facility] [phone] Quality rating ???? Patient survey rating???   [facility] Quality rating??? Patient survey rating???   [facility] [phone] Quality rating??? Patient survey rating ????   [facility] [phone] Quality rating ????   [facility] Quality rating ??? Patient survey rating ????   [facility] Quality rating ??? Patient survey rating???   [facility] Quality rating ????? Patient survey rating ???   [facility] [phone] Quality rating ????? Patient survey rating ????

## 2023-11-27 NOTE — Telephone Encounter (Signed)
Oral Oncology Patient Advocate Encounter  Called to check status. Application is still pending contact with the patient's daughter for eligibility confirmation. The program will be attempting to reach out again today.  Jinger Neighbors, CPhT-Adv Oncology Pharmacy Patient Advocate [facility] Direct Number: [phone]  Fax: [phone]

## 2023-11-27 NOTE — Discharge Summary (Signed)
Physician Discharge Summary  Jorge [name] OZD:[id number] DOB: July 11, 1951 DOA: 11/24/2023  PCP: Pcp, No  Admit date: 11/24/2023 Discharge date: 11/27/2023  Admitted From: Home Disposition:  Home with [facility]  Recommendations for Outpatient Follow-up:  Follow up with PCP in 1-2 weeks Please obtain BMP/CBC in one week your next doctors visit.  Doxycycline orally twice daily for 1 more day Tamiflu for 2 more days Per cardiology recommendation started on metoprolol 25 mg twice daily PPI daily   Discharge Condition: Stable CODE STATUS: Full Diet recommendation: Heart healthy  Brief/Interim Summary: Brief Narrative:  73 year old with history of significant rectal metastatic cancer and chemotherapy brought to the hospital for change in mental status.  Apparently patient was in normal state of health per family about 4 days ago but since then has reported of abdominal pain and noted to be confused.  Upon admission he was noted to be tachycardic and hypotensive.  EKG had showed wide-complex tachycardia therefore cardioversion was performed by the ER provider. Over the course of 3 days patient did much better, encephalopathy had resolved, was seen by cardiology and metoprolol twice daily was added.  From influenza pneumonia standpoint he was doing significantly well, his antibiotics was de-escalated to doxycycline only to treat any superimposed bacterial infection.  Will plan to complete his 5 days of Tamiflu.  PT/OT has recommended SNF, TOC has been consulted.  Due to his chronic diarrhea he had some electrolyte imbalance which was repleted. Today patient is doing much better, declining SNF therefore arrangements for home health services are being made.  Discussed with patient's daughter as well.  Assessment & Plan:  Principal Problem:   Pneumonia Active Problems:   Rectal cancer (HCC)   Acute encephalopathy   Abdominal pain   CAP (community acquired pneumonia)   Hyponatremia   Ventricular  tachycardia (HCC)   Ventricular tachycardia (HCC) Wide-complex ventricular tachycardia status post cardioversion in the ER.  TSH normal, seen by cardiology.  Added metoprolol 25 mg twice daily. - Echocardiogram - ef 65%  Acute toxic/hepatic encephalopathy, resolved -CT of the head is negative.  Ammonia level elevated at 59.  Continue rifaximin and ammonia.  Titrate about 3 bowel movements daily.  TSH, B12 normal   Hyponatremia, improved Resolved.    Influenza A pneumonia, improved CTA chest is suggestive of atypical infection.  Respiratory panel is positive for influenza A pneumonia.  Tamiflu total 5 days, bronchodilators, I-S/flutter valve.  Transition antibiotics to doxycycline (EOT 1/22).  Discontinue Zosyn.   Abdominal pain Appears to have resolved.  Abdominal x-ray is negative for any acute pathology.  Will continue to closely monitor this.   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 Mosetta Putt for chemotherapy, she is aware of patient's admission Follows outpatient palliative for pain control  Essential hypertension -Continue home meds.  IV as needed  Mild hypocalcemia/hypokalemia - As needed repletion  PT/OT = SNF. TOC consulted.    DVT prophylaxis: Lovenox    Code Status: Full Code Family Communication:  Called [name] Status is: Inpatient Remains inpatient appropriate because: SNF placement.    Subjective: Doing ok. Doesn't want SNF  Examination:  General exam: Appears calm and comfortable ; chronically ill Respiratory system: Clear to auscultation. Respiratory effort normal. Cardiovascular system: S1 & S2 heard, RRR. No JVD, murmurs, rubs, gallops or clicks. No pedal edema. Gastrointestinal system: Abdomen is nondistended, soft and nontender. No organomegaly or masses felt. Normal bowel sounds heard. Central nervous system: Alert and oriented.  No focal neurological deficits. Extremities: Symmetric 5  x 5 power. Skin: No rashes, lesions or ulcers Psychiatry: Judgement and insight appear normal. Mood & affect appropriate.    Discharge Diagnoses:  Principal Problem:   Atypical pneumonia Active Problems:   Rectal cancer (HCC)   Acute encephalopathy   Abdominal pain   CAP (community acquired pneumonia)   Hyponatremia   Wide-complex tachycardia      Discharge Exam: Vitals:   11/27/23 0512 11/27/23 1240  BP: 134/66 (!) 168/97  Pulse: 61 75  Resp: 18   Temp: (!) 97.5 F (36.4 C) 98.9 F (37.2 C)  SpO2: 98% 96%   Vitals:   11/26/23 1311 11/26/23 1930 11/27/23 0512 11/27/23 1240  BP:  (!) 148/86 134/66 (!) 168/97  Pulse: 64 77 61 75  Resp:  18 18   Temp:  98.1 F (36.7 C) (!) 97.5 F (36.4 C) 98.9 F (37.2 C)  TempSrc:  Oral Axillary Oral  SpO2: 100% 95% 98% 96%  Weight:      Height:          Discharge Instructions  Discharge Instructions     meds to beds pharmacy consult ([facility] ONLY)   Complete by: As directed       Allergies as of 11/27/2023   No Known Allergies      Medication List     TAKE these medications    amLODipine 10 MG tablet Commonly known as: NORVASC Take 1 tablet (10 mg total) by mouth daily.   DAYQUIL PO Take 1 tablet by mouth as needed.   diphenoxylate-atropine 2.5-0.025 MG tablet Commonly known as: LOMOTIL Take 1-2 tablets by mouth 4 (four) times daily as needed for diarrhea or loose stools.   doxycycline 100 MG tablet Commonly known as: VIBRA-TABS Take 1 tablet (100 mg total) by mouth every 12 (twelve) hours for 1 day.   gabapentin 100 MG capsule Commonly known as: Neurontin Take 2 capsules (200 mg total) by mouth 3 (three) times daily.   Lonsurf 20-8.19 MG tablet Generic drug: trifluridine-tipiracil Take 3 tablets (60 mg of trifluridine total) by mouth 2 (two) times daily after a meal. Take within 1 hr after AM & PM meals on days 1-5, 8-12. Repeat every 28 days.   metoprolol tartrate 25 MG tablet Commonly  known as: LOPRESSOR Take 1 tablet (25 mg total) by mouth 2 (two) times daily.   multivitamin with minerals Tabs tablet Take 1 tablet by mouth daily.   ondansetron 8 MG tablet Commonly known as: ZOFRAN Take 1 tablet (8 mg total) by mouth every 8 (eight) hours as needed for nausea or vomiting.   oseltamivir 75 MG capsule Commonly known as: TAMIFLU Take 1 capsule (75 mg total) by mouth 2 (two) times daily for 2 days.   oxyCODONE 5 MG immediate release tablet Commonly known as: Oxy IR/ROXICODONE Take 1 tablet (5 mg total) by mouth every 8 (eight) hours as needed for severe pain (pain score 7-10) or breakthrough pain.   pantoprazole 40 MG tablet Commonly known as: PROTONIX Take 1 tablet (40 mg total) by mouth daily before breakfast.   prochlorperazine 10 MG tablet Commonly known as: COMPAZINE Take 1 tablet (10 mg total) by mouth every 6 (six) hours as needed (Nausea or vomiting).               Durable Medical Equipment  (From admission, onward)           Start     Ordered   11/27/23 1235  For home use only DME Walker rolling  Once       Question Answer Comment  Walker: With 5 Inch Wheels   Patient needs a walker to treat with the following condition Ambulatory dysfunction      11/27/23 1234            No Known Allergies  You were cared for by a hospitalist during your hospital stay. If you have any questions about your discharge medications or the care you received while you were in the hospital after you are discharged, you can call the unit and asked to speak with the hospitalist on call if the hospitalist that took care of you is not available. Once you are discharged, your primary care physician will handle any further medical issues. Please note that no refills for any discharge medications will be authorized once you are discharged, as it is imperative that you return to your primary care physician (or establish a relationship with a primary care physician if  you do not have one) for your aftercare needs so that they can reassess your need for medications and monitor your lab values.  You were cared for by a hospitalist during your hospital stay. If you have any questions about your discharge medications or the care you received while you were in the hospital after you are discharged, you can call the unit and asked to speak with the hospitalist on call if the hospitalist that took care of you is not available. Once you are discharged, your primary care physician will handle any further medical issues. Please note that NO REFILLS for any discharge medications will be authorized once you are discharged, as it is imperative that you return to your primary care physician (or establish a relationship with a primary care physician if you do not have one) for your aftercare needs so that they can reassess your need for medications and monitor your lab values.  Please request your Prim.MD to go over all Hospital Tests and Procedure/Radiological results at the follow up, please get all Hospital records sent to your Prim MD by signing hospital release before you go home.  Get CBC, CMP, 2 view Chest X ray checked  by Primary MD during your next visit or SNF MD in 5-7 days ( we routinely change or add medications that can affect your baseline labs and fluid status, therefore we recommend that you get the mentioned basic workup next visit with your PCP, your PCP may decide not to get them or add new tests based on their clinical decision)  On your next visit with your primary care physician please Get Medicines reviewed and adjusted.  If you experience worsening of your admission symptoms, develop shortness of breath, life threatening emergency, suicidal or homicidal thoughts you must seek medical attention immediately by calling 911 or calling your MD immediately  if symptoms less severe.  You Must read complete instructions/literature along with all the possible  adverse reactions/side effects for all the Medicines you take and that have been prescribed to you. Take any new Medicines after you have completely understood and accpet all the possible adverse reactions/side effects.   Do not drive, operate heavy machinery, perform activities at heights, swimming or participation in water activities or provide baby sitting services if your were admitted for syncope or siezures until you have seen by Primary MD or a Neurologist and advised to do so again.  Do not drive when taking Pain medications.   Procedures/Studies: ECHOCARDIOGRAM COMPLETE  Result Date: 11/25/2023    ECHOCARDIOGRAM REPORT   Patient Name:   Jorge [name] Date of Exam: 11/25/2023 Medical Rec #:  [id number]        Height:       72.0 in Accession #:    [id number]       Weight:       131.8 lb Date of Birth:  December 18, 1950        BSA:          1.785 m Patient Age:    72 years         BP:           144/68 mmHg Patient Gender: M                HR:           73 bpm. Exam Location:  Inpatient Procedure: 2D Echo, Cardiac Doppler and Color Doppler Indications:    Ventricular tachycardia  History:        Patient has no prior history of Echocardiogram examinations.  Sonographer:    Vern Claude Referring Phys: [id number] HERSH GOEL IMPRESSIONS  1. Left ventricular ejection fraction, by estimation, is 60 to 65%. The left ventricle has normal function. The left ventricle has no regional wall motion abnormalities. Left ventricular diastolic parameters were normal.  2. Right ventricular systolic function is normal. The right ventricular size is normal. There is normal pulmonary artery systolic pressure. The estimated right ventricular systolic pressure is 27.8 mmHg.  3. The mitral valve is normal in structure. No evidence of mitral valve regurgitation. No evidence of mitral stenosis.  4. The aortic valve is grossly normal. Aortic valve regurgitation is not visualized. No aortic stenosis is present.  5. The inferior vena cava  is normal in size with greater than 50% respiratory variability, suggesting right atrial pressure of 3 mmHg. FINDINGS  Left Ventricle: Left ventricular ejection fraction, by estimation, is 60 to 65%. The left ventricle has normal function. The left ventricle has no regional wall motion abnormalities. The left ventricular internal cavity size was normal in size. There is  no left ventricular hypertrophy. Left ventricular diastolic parameters were normal. Right Ventricle: The right ventricular size is normal. No increase in right ventricular wall thickness. Right ventricular systolic function is normal. There is normal pulmonary artery systolic pressure. The tricuspid regurgitant velocity is 2.49 m/s, and  with an assumed right atrial pressure of 3 mmHg, the estimated right ventricular systolic pressure is 27.8 mmHg. Left Atrium: Left atrial size was normal in size. Right Atrium: Right atrial size was normal in size. Pericardium: There is no evidence of pericardial effusion. Mitral Valve: The mitral valve is normal in structure. No evidence of mitral valve regurgitation. No evidence of mitral valve stenosis. MV peak gradient, 3.3 mmHg. The mean mitral valve gradient is 1.0 mmHg. Tricuspid Valve: The tricuspid valve is normal in structure. Tricuspid valve regurgitation is mild . No evidence of tricuspid stenosis. Aortic Valve: The aortic valve is grossly normal. Aortic valve regurgitation is not visualized. No aortic stenosis is present. Aortic valve mean gradient measures 4.0 mmHg. Aortic valve peak gradient measures 7.0 mmHg. Aortic valve area, by VTI measures 3.10 cm. Pulmonic Valve: The pulmonic valve was normal in structure. Pulmonic valve regurgitation is not visualized. No evidence of pulmonic stenosis. Aorta: The aortic root is normal in size and structure. Venous: The inferior vena cava is normal in size with greater than 50% respiratory variability, suggesting right atrial pressure of 3 mmHg.  IAS/Shunts: No  atrial level shunt detected by color flow Doppler.  LEFT VENTRICLE PLAX 2D LVIDd:         5.20 cm      Diastology LVIDs:         3.50 cm      LV e' medial:    5.98 cm/s LV PW:         0.70 cm      LV E/e' medial:  13.0 LV IVS:        0.70 cm      LV e' lateral:   10.10 cm/s LVOT diam:     2.10 cm      LV E/e' lateral: 7.7 LV SV:         74 LV SV Index:   42 LVOT Area:     3.46 cm  LV Volumes (MOD) LV vol d, MOD A2C: 97.3 ml LV vol d, MOD A4C: 148.0 ml LV vol s, MOD A2C: 37.3 ml LV vol s, MOD A4C: 51.1 ml LV SV MOD A2C:     60.0 ml LV SV MOD A4C:     148.0 ml LV SV MOD BP:      79.4 ml RIGHT VENTRICLE             IVC RV Basal diam:  3.20 cm     IVC diam: 1.30 cm RV Mid diam:    3.10 cm RV S prime:     13.10 cm/s TAPSE (M-mode): 1.9 cm LEFT ATRIUM             Index        RIGHT ATRIUM          Index LA diam:        2.50 cm 1.40 cm/m   RA Area:     7.31 cm LA Vol (A2C):   16.3 ml 9.13 ml/m   RA Volume:   13.40 ml 7.51 ml/m LA Vol (A4C):   37.4 ml 20.96 ml/m LA Biplane Vol: 24.6 ml 13.78 ml/m  AORTIC VALVE                    PULMONIC VALVE AV Area (Vmax):    3.07 cm     PV Vmax:       0.66 m/s AV Area (Vmean):   2.66 cm     PV Peak grad:  1.7 mmHg AV Area (VTI):     3.10 cm AV Vmax:           132.00 cm/s AV Vmean:          89.500 cm/s AV VTI:            0.240 m AV Peak Grad:      7.0 mmHg AV Mean Grad:      4.0 mmHg LVOT Vmax:         117.00 cm/s LVOT Vmean:        68.800 cm/s LVOT VTI:          0.215 m LVOT/AV VTI ratio: 0.90  AORTA Ao Root diam: 3.10 cm Ao Asc diam:  3.10 cm MITRAL VALVE               TRICUSPID VALVE MV Area (PHT): 2.95 cm    TR Peak grad:   24.8 mmHg MV Area VTI:   3.02 cm    TR Vmax:        249.00 cm/s MV Peak grad:  3.3 mmHg MV Mean grad:  1.0 mmHg    SHUNTS MV Vmax:       0.90 m/s    Systemic VTI:  0.22 m MV Vmean:      56.5 cm/s   Systemic Diam: 2.10 cm MV Decel Time: 257 msec MV E velocity: 77.90 cm/s MV A velocity: 86.80 cm/s MV E/A ratio:  0.90 Mihai Croitoru MD Electronically  signed by Thurmon Fair MD Signature Date/Time: 11/25/2023/8:51:57 AM    Final    DG Abd 1 View Result Date: 11/24/2023 CLINICAL DATA:  Altered mental status, rectal pain. EXAM: ABDOMEN - 1 VIEW COMPARISON:  CT abdomen pelvis dated 10/09/2023. FINDINGS: The bowel gas pattern is normal. No calculi overlying the expected course of the urinary tract. Degenerative changes are seen in the spine and hips. Excreted contrast is seen in the bladder. IMPRESSION: Nonobstructive bowel gas pattern. Electronically Signed   By: Romona Curls M.D.   On: 11/24/2023 16:21   CT Angio Chest PE W and/or Wo Contrast Result Date: 11/24/2023 CLINICAL DATA:  PE suspected. High probability. History of colorectal carcinoma. * Tracking Code: BO * EXAM: CT ANGIOGRAPHY CHEST WITH CONTRAST TECHNIQUE: Multidetector CT imaging of the chest was performed using the standard protocol during bolus administration of intravenous contrast. Multiplanar CT image reconstructions and MIPs were obtained to evaluate the vascular anatomy. RADIATION DOSE REDUCTION: This exam was performed according to the departmental dose-optimization program which includes automated exposure control, adjustment of the mA and/or kV according to patient size and/or use of iterative reconstruction technique. CONTRAST:  75mL OMNIPAQUE IOHEXOL 350 MG/ML SOLN COMPARISON:  10/09/2023. FINDINGS: Cardiovascular: Satisfactory opacification of the pulmonary arteries to the segmental level. No evidence of pulmonary embolism. Normal heart size. No pericardial effusion. Coronary artery calcifications. Mediastinum/Nodes: Thyroid gland, trachea, and esophagus demonstrate no significant findings. Subcarinal lymph node measures 1.1 cm, image 87/4. This is new from previous exam. Enlarged right hilar lymph node is also new measuring 1.9 cm, image 85/4. Lungs/Pleura: Emphysema. Diffuse bronchial wall thickening. No pleural effusion, airspace consolidation, atelectasis or pneumothorax.  Multifocal pulmonary nodules are again noted. Index nodule within the right upper lobe measures 2.4 cm, image 71/12. This is compared with 2.5 cm previously. Index nodule within the right middle lobe measures 2.8 cm, image 96/12. Also unchanged from previous exam. Index nodule within the anterior right apex measures 2 cm, image 39/12. SINCE THE PREVIOUS EXAM THERE IS BEEN INTERVAL DEVELOPMENT OF EXTENSIVE IRREGULAR AND ILL-DEFINED IRREGULAR PERIBRONCHOVASCULAR NODULAR DENSITIES THROUGHOUT BOTH LOWER LOBES, RIGHT MIDDLE LOBE AND RIGHT UPPER LOBE.  MANY OF THESE APPEAR TO HAVE A CAVITARY COMPONENT.  INDEX NODULES INCLUDE: SINCE THE PREVIOUS EXAM THERE IS BEEN INTERVAL DEVELOPMENT OF EXTENSIVE IRREGULAR AND ILL-DEFINED IRREGULAR PERIBRONCHOVASCULAR NODULAR DENSITIES THROUGHOUT BOTH LOWER LOBES, RIGHT MIDDLE LOBE AND RIGHT UPPER LOBE. MANY OF THESE APPEAR TO HAVE A CAVITARY COMPONENT. INDEX NODULES INCLUDE -nodule within the lateral right lower lobe measures 1.2 cm, image 106/2. -Nodule in the posterior right lower lobe measures 1 cm, image 112/2. -nodule within the right upper lobe measures 0.9 cm, image 56/12. Upper Abdomen: No acute abnormality within the imaged portions of the upper abdomen. Sub optimal visualization of previously noted scattered low-density liver lesions due to phase of contrast opacification. Gallbladder appears distended. Small amount of perihepatic ascites overlies the right hepatic lobe. Musculoskeletal: No chest wall abnormality. No acute or significant osseous findings. Review of the MIP images confirms the above findings. IMPRESSION: 1. No evidence for acute pulmonary embolus. 2. Interval development of extensive irregular and ill-defined  irregular peribronchovascular nodular densities throughout both lower lobes, right middle lobe and right upper lobe. Associated diffuse bronchial wall thickening is identified throughout both lungs. Findings are favored to represent atypical infection such  as fungal or atypical mycobacterial infection. Metastatic disease is considered less favored. 3. No significant change in the appearance of previously noted pulmonary metastasis. 4. New mediastinal and right hilar lymphadenopathy. In the acute setting this may reflect reactive adenopathy secondary to infection. Nodal metastasis is considered less favored but cannot be excluded with a high degree of certainty. 5. Coronary artery calcifications. 6. Small amount of perihepatic ascites overlies the right hepatic lobe. 7.  Emphysema (ICD10-J43.9). Electronically Signed   By: Signa Kell M.D.   On: 11/24/2023 14:33   CT Head Wo Contrast Result Date: 11/24/2023 CLINICAL DATA:  Altered mental status. Incontinence and decreased urine output. Intermittent rectal pain. EXAM: CT HEAD WITHOUT CONTRAST TECHNIQUE: Contiguous axial images were obtained from the base of the skull through the vertex without intravenous contrast. RADIATION DOSE REDUCTION: This exam was performed according to the departmental dose-optimization program which includes automated exposure control, adjustment of the mA and/or kV according to patient size and/or use of iterative reconstruction technique. COMPARISON:  None Available. FINDINGS: Brain: Ventricles, cisterns and other CSF spaces are normal. There is no mass, mass effect, shift of midline structures or acute hemorrhage. No evidence of acute infarction. Vascular: No hyperdense vessel or unexpected calcification. Skull: Normal. Negative for fracture or focal lesion. Sinuses/Orbits: Orbits are normal. Mild to moderate chronic inflammatory change of the paranasal sinuses predominately involving the maxillary and ethmoid sinuses. Other: None. IMPRESSION: 1. No acute intracranial findings. 2. Mild to moderate chronic inflammatory change of the paranasal sinuses. Electronically Signed   By: Elberta Fortis M.D.   On: 11/24/2023 14:26     The results of significant diagnostics from this  hospitalization (including imaging, microbiology, ancillary and laboratory) are listed below for reference.     Microbiology: Recent Results (from the past 240 hours)  Resp panel by RT-PCR (RSV, Flu A&B, Covid) Anterior Nasal Swab     Status: Abnormal   Collection Time: 11/24/23 11:48 AM   Specimen: Anterior Nasal Swab  Result Value Ref Range Status   SARS Coronavirus 2 by RT PCR NEGATIVE NEGATIVE Final    Comment: (NOTE) SARS-CoV-2 target nucleic acids are NOT DETECTED.  The SARS-CoV-2 RNA is generally detectable in upper respiratory specimens during the acute phase of infection. The lowest concentration of SARS-CoV-2 viral copies this assay can detect is 138 copies/mL. A negative result does not preclude SARS-Cov-2 infection and should not be used as the sole basis for treatment or other patient management decisions. A negative result may occur with  improper specimen collection/handling, submission of specimen other than nasopharyngeal swab, presence of viral mutation(s) within the areas targeted by this assay, and inadequate number of viral copies(<138 copies/mL). A negative result must be combined with clinical observations, patient history, and epidemiological information. The expected result is Negative.  Fact Sheet for Patients:  BloggerCourse.com  Fact Sheet for Healthcare Providers:  SeriousBroker.it  This test is no t yet approved or cleared by the [place] FDA and  has been authorized for detection and/or diagnosis of SARS-CoV-2 by FDA under an Emergency Use Authorization (EUA). This EUA will remain  in effect (meaning this test can be used) for the duration of the COVID-19 declaration under Section 564(b)(1) of the Act, 21 U.S.C.section 360bbb-3(b)(1), unless the authorization is terminated  or revoked sooner.  Influenza A by PCR POSITIVE (A) NEGATIVE Final   Influenza B by PCR NEGATIVE NEGATIVE Final     Comment: (NOTE) The Xpert Xpress SARS-CoV-2/FLU/RSV plus assay is intended as an aid in the diagnosis of influenza from Nasopharyngeal swab specimens and should not be used as a sole basis for treatment. Nasal washings and aspirates are unacceptable for Xpert Xpress SARS-CoV-2/FLU/RSV testing.  Fact Sheet for Patients: BloggerCourse.com  Fact Sheet for Healthcare Providers: SeriousBroker.it  This test is not yet approved or cleared by the [place] FDA and has been authorized for detection and/or diagnosis of SARS-CoV-2 by FDA under an Emergency Use Authorization (EUA). This EUA will remain in effect (meaning this test can be used) for the duration of the COVID-19 declaration under Section 564(b)(1) of the Act, 21 U.S.C. section 360bbb-3(b)(1), unless the authorization is terminated or revoked.     Resp Syncytial Virus by PCR NEGATIVE NEGATIVE Final    Comment: (NOTE) Fact Sheet for Patients: BloggerCourse.com  Fact Sheet for Healthcare Providers: SeriousBroker.it  This test is not yet approved or cleared by the [place] FDA and has been authorized for detection and/or diagnosis of SARS-CoV-2 by FDA under an Emergency Use Authorization (EUA). This EUA will remain in effect (meaning this test can be used) for the duration of the COVID-19 declaration under Section 564(b)(1) of the Act, 21 U.S.C. section 360bbb-3(b)(1), unless the authorization is terminated or revoked.  Performed at [facility], [address], [place], [place] [address]   Blood culture (routine x 2)     Status: None (Preliminary result)   Collection Time: 11/24/23 12:12 PM   Specimen: BLOOD RIGHT HAND  Result Value Ref Range Status   Specimen Description BLOOD RIGHT HAND  Final   Special Requests   Final    BOTTLES DRAWN AEROBIC ONLY Blood Culture results may not be optimal  due to an inadequate volume of blood received in culture bottles   Culture   Final    NO GROWTH 3 DAYS Performed at [facility], [address] [place], [place] [address]    Report Status PENDING  Incomplete  Blood culture (routine x 2)     Status: None (Preliminary result)   Collection Time: 11/24/23  2:12 PM   Specimen: BLOOD  Result Value Ref Range Status   Specimen Description BLOOD SITE NOT SPECIFIED  Final   Special Requests   Final    AEROBIC BOTTLE ONLY Blood Culture results may not be optimal due to an inadequate volume of blood received in culture bottles   Culture   Final    NO GROWTH 3 DAYS Performed at [facility], [address], [place], [place] [address]    Report Status PENDING  Incomplete  MRSA Next Gen by PCR, Nasal     Status: None   Collection Time: 11/24/23  6:18 PM   Specimen: Nasal Mucosa; Nasal Swab  Result Value Ref Range Status   MRSA by PCR Next Gen NOT DETECTED NOT DETECTED Final    Comment: (NOTE) The GeneXpert MRSA Assay (FDA approved for NASAL specimens only), is one component of a comprehensive MRSA colonization surveillance program. It is not intended to diagnose MRSA infection nor to guide or monitor treatment for MRSA infections. Test performance is not FDA approved in patients less than 71 years old. Performed at [facility], [address] [place] [address], [place], [place] [address]      Labs: BNP (last 3 results) Recent Labs  11/24/23 1149 11/25/23 0853  BNP 357.7* 151.7*   Basic Metabolic Panel: Recent Labs  Lab 11/24/23 1124 11/24/23 1140 11/25/23 0853 11/26/23 0620 11/27/23 0546  NA 128*  --  132* 133* 135  K 3.9  --  3.5 3.3* 3.9  CL 94*  --  96* 99 105  CO2 21*  --  26 27 26   GLUCOSE 128*  --  94 97 109*  BUN 9  --  12 10 10   CREATININE 0.78  --  0.93 0.95 0.38*  CALCIUM 7.9*  --  8.1* 7.9* 8.4*  MG  --  2.4  --  2.3 2.1  PHOS  --   --   --  3.4  --    Liver Function Tests: Recent Labs  Lab  11/24/23 1124 11/25/23 0853 11/26/23 0620 11/27/23 0546  AST 50* 34 26 26  ALT 33 27 23 21   ALKPHOS 130* 98 96 124  BILITOT 1.3* 1.3* 0.8 0.4  PROT 6.2* 6.1* 6.0* 6.2*  ALBUMIN 2.6* 2.4* 2.3* 2.4*   No results for input(s): "LIPASE", "AMYLASE" in the last 168 hours. Recent Labs  Lab 11/24/23 1125 11/26/23 0920  AMMONIA 59* 26   CBC: Recent Labs  Lab 11/24/23 1124 11/25/23 0853 11/26/23 0620 11/27/23 0546  WBC 11.5* 5.9 5.5 4.5  NEUTROABS 8.9*  --   --   --   HGB 13.4 11.7* 11.4* 11.1*  HCT 41.8 36.5* 36.4* 35.6*  MCV 88.2 87.7 88.8 90.4  PLT 329 282 284 301   Cardiac Enzymes: No results for input(s): "CKTOTAL", "CKMB", "CKMBINDEX", "TROPONINI" in the last 168 hours. BNP: Invalid input(s): "POCBNP" CBG: Recent Labs  Lab 11/24/23 1149  GLUCAP 101*   D-Dimer No results for input(s): "DDIMER" in the last 72 hours. Hgb A1c No results for input(s): "HGBA1C" in the last 72 hours. Lipid Profile No results for input(s): "CHOL", "HDL", "LDLCALC", "TRIG", "CHOLHDL", "LDLDIRECT" in the last 72 hours. Thyroid function studies No results for input(s): "TSH", "T4TOTAL", "T3FREE", "THYROIDAB" in the last 72 hours.  Invalid input(s): "FREET3" Anemia work up Recent Labs    11/25/23 0853 11/26/23 0620  VITAMINB12 3,003*  --   FOLATE  --  23.9   Urinalysis    Component Value Date/Time   COLORURINE AMBER (A) 11/24/2023 1124   APPEARANCEUR HAZY (A) 11/24/2023 1124   LABSPEC 1.013 11/24/2023 1124   PHURINE 7.0 11/24/2023 1124   GLUCOSEU 50 (A) 11/24/2023 1124   HGBUR SMALL (A) 11/24/2023 1124   BILIRUBINUR NEGATIVE 11/24/2023 1124   KETONESUR NEGATIVE 11/24/2023 1124   PROTEINUR >=300 (A) 11/24/2023 1124   NITRITE NEGATIVE 11/24/2023 1124   LEUKOCYTESUR NEGATIVE 11/24/2023 1124   Sepsis Labs Recent Labs  Lab 11/24/23 1124 11/25/23 0853 11/26/23 0620 11/27/23 0546  WBC 11.5* 5.9 5.5 4.5   Microbiology Recent Results (from the past 240 hours)  Resp panel  by RT-PCR (RSV, Flu A&B, Covid) Anterior Nasal Swab     Status: Abnormal   Collection Time: 11/24/23 11:48 AM   Specimen: Anterior Nasal Swab  Result Value Ref Range Status   SARS Coronavirus 2 by RT PCR NEGATIVE NEGATIVE Final    Comment: (NOTE) SARS-CoV-2 target nucleic acids are NOT DETECTED.  The SARS-CoV-2 RNA is generally detectable in upper respiratory specimens during the acute phase of infection. The lowest concentration of SARS-CoV-2 viral copies this assay can detect is 138 copies/mL. A negative result does not preclude SARS-Cov-2 infection and should not be used as the sole basis for  treatment or other patient management decisions. A negative result may occur with  improper specimen collection/handling, submission of specimen other than nasopharyngeal swab, presence of viral mutation(s) within the areas targeted by this assay, and inadequate number of viral copies(<138 copies/mL). A negative result must be combined with clinical observations, patient history, and epidemiological information. The expected result is Negative.  Fact Sheet for Patients:  BloggerCourse.com  Fact Sheet for Healthcare Providers:  SeriousBroker.it  This test is no t yet approved or cleared by the [place] FDA and  has been authorized for detection and/or diagnosis of SARS-CoV-2 by FDA under an Emergency Use Authorization (EUA). This EUA will remain  in effect (meaning this test can be used) for the duration of the COVID-19 declaration under Section 564(b)(1) of the Act, 21 U.S.C.section 360bbb-3(b)(1), unless the authorization is terminated  or revoked sooner.       Influenza A by PCR POSITIVE (A) NEGATIVE Final   Influenza B by PCR NEGATIVE NEGATIVE Final    Comment: (NOTE) The Xpert Xpress SARS-CoV-2/FLU/RSV plus assay is intended as an aid in the diagnosis of influenza from Nasopharyngeal swab specimens and should not be used as a  sole basis for treatment. Nasal washings and aspirates are unacceptable for Xpert Xpress SARS-CoV-2/FLU/RSV testing.  Fact Sheet for Patients: BloggerCourse.com  Fact Sheet for Healthcare Providers: SeriousBroker.it  This test is not yet approved or cleared by the [place] FDA and has been authorized for detection and/or diagnosis of SARS-CoV-2 by FDA under an Emergency Use Authorization (EUA). This EUA will remain in effect (meaning this test can be used) for the duration of the COVID-19 declaration under Section 564(b)(1) of the Act, 21 U.S.C. section 360bbb-3(b)(1), unless the authorization is terminated or revoked.     Resp Syncytial Virus by PCR NEGATIVE NEGATIVE Final    Comment: (NOTE) Fact Sheet for Patients: BloggerCourse.com  Fact Sheet for Healthcare Providers: SeriousBroker.it  This test is not yet approved or cleared by the [place] FDA and has been authorized for detection and/or diagnosis of SARS-CoV-2 by FDA under an Emergency Use Authorization (EUA). This EUA will remain in effect (meaning this test can be used) for the duration of the COVID-19 declaration under Section 564(b)(1) of the Act, 21 U.S.C. section 360bbb-3(b)(1), unless the authorization is terminated or revoked.  Performed at [facility], [address] [place], [place] [address]   Blood culture (routine x 2)     Status: None (Preliminary result)   Collection Time: 11/24/23 12:12 PM   Specimen: BLOOD RIGHT HAND  Result Value Ref Range Status   Specimen Description BLOOD RIGHT HAND  Final   Special Requests   Final    BOTTLES DRAWN AEROBIC ONLY Blood Culture results may not be optimal due to an inadequate volume of blood received in culture bottles   Culture   Final    NO GROWTH 3 DAYS Performed at [facility], [address], [place], [place] [address]     Report Status PENDING  Incomplete  Blood culture (routine x 2)     Status: None (Preliminary result)   Collection Time: 11/24/23  2:12 PM   Specimen: BLOOD  Result Value Ref Range Status   Specimen Description BLOOD SITE NOT SPECIFIED  Final   Special Requests   Final    AEROBIC BOTTLE ONLY Blood Culture results may not be optimal due to an inadequate volume of blood received in culture bottles   Culture   Final  NO GROWTH 3 DAYS Performed at [facility], [address] [place], [place] [address]    Report Status PENDING  Incomplete  MRSA Next Gen by PCR, Nasal     Status: None   Collection Time: 11/24/23  6:18 PM   Specimen: Nasal Mucosa; Nasal Swab  Result Value Ref Range Status   MRSA by PCR Next Gen NOT DETECTED NOT DETECTED Final    Comment: (NOTE) The GeneXpert MRSA Assay (FDA approved for NASAL specimens only), is one component of a comprehensive MRSA colonization surveillance program. It is not intended to diagnose MRSA infection nor to guide or monitor treatment for MRSA infections. Test performance is not FDA approved in patients less than 15 years old. Performed at [facility], [address], [place], [place] [address]      Time coordinating discharge:  I have spent 35 minutes face to face with the patient and on the ward discussing the patients care, assessment, plan and disposition with other care givers. >50% of the time was devoted counseling the patient about the risks and benefits of treatment/Discharge disposition and coordinating care.   SIGNED:   Miguel Rota, MD  [facility] 11/27/2023, 12:52 PM   If 7PM-7AM, please contact night-coverage

## 2023-11-28 ENCOUNTER — Encounter: Payer: Self-pay | Admitting: Nurse Practitioner

## 2023-11-28 ENCOUNTER — Inpatient Hospital Stay: Payer: Medicare Other | Admitting: Nurse Practitioner

## 2023-11-28 DIAGNOSIS — G893 Neoplasm related pain (acute) (chronic): Secondary | ICD-10-CM | POA: Diagnosis not present

## 2023-11-28 DIAGNOSIS — C20 Malignant neoplasm of rectum: Secondary | ICD-10-CM

## 2023-11-28 DIAGNOSIS — Z515 Encounter for palliative care: Secondary | ICD-10-CM | POA: Diagnosis not present

## 2023-11-28 DIAGNOSIS — C7800 Secondary malignant neoplasm of unspecified lung: Secondary | ICD-10-CM

## 2023-11-28 DIAGNOSIS — R530 Neoplastic (malignant) related fatigue: Secondary | ICD-10-CM | POA: Diagnosis not present

## 2023-11-28 DIAGNOSIS — J1100 Influenza due to unidentified influenza virus with unspecified type of pneumonia: Secondary | ICD-10-CM

## 2023-11-28 MED ORDER — OXYCODONE HCL 5 MG PO TABS: 5.0000 mg | ORAL_TABLET | Freq: Four times a day (QID) | ORAL | 0 refills | Status: DC | PRN

## 2023-11-28 NOTE — Progress Notes (Signed)
Palliative Medicine [facility]  [phone] [phone]   Name: Jorge [name] Date: 11/28/2023 MRN: [id number]  DOB: 20-Jan-1951  Patient Care Team: Pcp, No as PCP - General Croitoru, Mihai, MD as PCP - Cardiology (Cardiology) Pickenpack-Cousar, Arty Baumgartner, NP as Nurse Practitioner (Nurse Practitioner) Malachy Mood, MD as Consulting Physician (Hematology)   I connected with Jorge [name] on 11/28/23 at 11:30 AM EST by phone and verified that I am speaking with the correct person using two identifiers.   I discussed the limitations, risks, security and privacy concerns of performing an evaluation and management service by telemedicine and the availability of in-person appointments. I also discussed with the patient that there may be a patient responsible charge related to this service. The patient expressed understanding and agreed to proceed.   Other persons participating in the visit and their role in the encounter: [name] (daughter)   Patient's location: home  Provider's location: [facility]   Chief Complaint: f/u of symptom management    INTERVAL HISTORY: Jorge [name] is a 73 y.o. 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  Palliative ask to see for symptom management and goals of care.   SOCIAL HISTORY:     reports that he has been smoking cigarettes. He has a 5 pack-year smoking history. He has never used smokeless tobacco. He reports that he does not currently use alcohol. He reports that he does not use drugs.  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  MEDICATIONS:  Current Outpatient Medications  Medication Sig Dispense Refill   amLODipine (NORVASC) 10 MG tablet  Take 1 tablet (10 mg total) by mouth daily. 90 tablet 1   diphenoxylate-atropine (LOMOTIL) 2.5-0.025 MG tablet Take 1-2 tablets by mouth 4 (four) times daily as needed for diarrhea or loose stools. 120 tablet 2   doxycycline (VIBRA-TABS) 100 MG tablet Take 1 tablet (100 mg total) by mouth every 12 (twelve) hours for 1 day. 2 tablet 0   gabapentin (NEURONTIN) 100 MG capsule Take 2 capsules (200 mg total) by mouth 3 (three) times daily. 180 capsule 2   metoprolol tartrate (LOPRESSOR) 25 MG tablet Take 1 tablet (25 mg total) by mouth 2 (two) times daily. 60 tablet 0   Multiple Vitamin (MULTIVITAMIN WITH MINERALS) TABS tablet Take 1 tablet by mouth daily. (Patient not taking: Reported on 11/24/2023)     ondansetron (ZOFRAN) 8 MG tablet Take 1 tablet (8 mg total) by mouth every 8 (eight) hours as needed for nausea or vomiting. (Patient not taking: Reported on 11/24/2023) 30 tablet 3   oseltamivir (TAMIFLU) 75 MG capsule Take 1 capsule (75 mg total) by mouth 2 (two) times daily for 2 days. 4 capsule 0   oxyCODONE (OXY IR/ROXICODONE) 5 MG immediate release tablet Take 1 tablet (5 mg total) by mouth every 8 (eight) hours as needed for severe pain (pain score 7-10) or breakthrough pain. 45 tablet 0   pantoprazole (PROTONIX) 40 MG tablet Take 1 tablet (40 mg total) by mouth daily before breakfast. 90 tablet 0   prochlorperazine (COMPAZINE) 10 MG tablet Take 1 tablet (10 mg total) by mouth every 6 (six) hours as needed (Nausea or vomiting). 30 tablet 1   Pseudoephedrine-APAP-DM (  DAYQUIL PO) Take 1 tablet by mouth as needed.     trifluridine-tipiracil (LONSURF) 20-8.19 MG tablet Take 3 tablets (60 mg of trifluridine total) by mouth 2 (two) times daily after a meal. Take within 1 hr after AM & PM meals on days 1-5, 8-12. Repeat every 28 days. (Patient not taking: Reported on 11/24/2023) 60 tablet 0   No current facility-administered medications for this visit.    VITAL SIGNS: There were no vitals taken for this  visit. There were no vitals filed for this visit.   Estimated body mass index is 17.88 kg/m as calculated from the following:   Height as of 11/24/23: 6' (1.829 m).   Weight as of 11/24/23: 131 lb 13.4 oz (59.8 kg).   PERFORMANCE STATUS (ECOG) : 1 - Symptomatic but completely ambulatory  IMPRESSION:  I connected with Jorge [name] and his daughter for follow-up. Patient is alert however somewhat slow to respond appropriately to questions. Appetite has decreased. Denies nausea, vomiting, or constipation.   Jorge [name] recently discharged from hospital on 1/21 after spending several days for influenza A, sepsis, pneumonia, and tachycardia. He was discharged home with family and home health as requested. Patient reports he is having increased abdominal and body pain/aches. States he cannot specifically describe the paing but it is persistent and "miserable". Daughter expressed concerns seeing patient in increased pain. States she had to stay awake with him overnight for support as current regimen of oxycodone 5mg  every 8 hours and gabapentin did not afford any relief.   Education provided on the differences in acute/situational pain related to recent hospitalization course versus his chronic cancer related pain. Advised we would not adjust his chronic pain medications for this however will support in managing symptoms temporarily. Education provided on use of Tylenol ES every 8 hours in addition to continue use of oxycodone 5mg  every 4-6 hours as needed for severe pain. He is on Tamiflu and course should be completed Saturday. Patient and daughter aware will continue this regimen for 7 days again allowing support for his current symptoms.   All questions answered and support provided. during his infusion.   PLAN:  Continue Gabapentin 300mg  three times daily.   Oxy IR 5mg  every 4-6 hours as needed for severe pain over the next 7 days. Will send in refill as appropriate. I will plan to see patient back  in 2-3 weeks in collaboration with his oncology appointments.   Patient expressed understanding and was in agreement with this plan. He also understands that He can call the clinic at any time with any questions, concerns, or complaints.     Any controlled substances utilized were prescribed in the context of palliative care. PDMP has been reviewed.    Visit consisted of counseling and education dealing with the complex and emotionally intense issues of symptom management and palliative care in the setting of serious and potentially life-threatening illness.  Willette Alma, AGPCNP-BC  Palliative Medicine Team/[facility]

## 2023-11-29 ENCOUNTER — Other Ambulatory Visit: Payer: Medicare Other

## 2023-11-29 ENCOUNTER — Ambulatory Visit: Payer: Medicare Other

## 2023-11-29 ENCOUNTER — Ambulatory Visit: Payer: Medicare Other | Admitting: Nurse Practitioner

## 2023-11-29 LAB — CULTURE, BLOOD (ROUTINE X 2)

## 2023-11-29 NOTE — Telephone Encounter (Signed)
Oral Oncology Patient Advocate Encounter  The program requested POI for the patient. The patient's daughter, [name], is going to bring that information by the office tomorrow so I can submit it to the program.  Jinger Neighbors, CPhT-Adv Oncology Pharmacy Patient Advocate [facility] Direct Number: [phone]  Fax: [phone]

## 2023-12-01 ENCOUNTER — Ambulatory Visit: Payer: Medicare Other

## 2023-12-03 NOTE — Telephone Encounter (Signed)
Oral Oncology Patient Advocate Encounter  Called and spoke with the patient's daughter regarding the POI. [name] stated she is able to fax from her work. I advised that as long as she was able to do so she did not need to bring the documentation by the office. I confirmed she had the correct fax number for the program, [phone].  Jinger Neighbors, CPhT-Adv Oncology Pharmacy Patient Advocate [facility] Direct Number: [phone]  Fax: [phone]

## 2023-12-04 ENCOUNTER — Encounter: Payer: Self-pay | Admitting: Hematology

## 2023-12-04 NOTE — Telephone Encounter (Signed)
Oral Oncology Patient Advocate Encounter  Called and spoke to [name]. POI has not yet been received.  Jinger Neighbors, CPhT-Adv Oncology Pharmacy Patient Advocate [facility] Direct Number: [phone]  Fax: [phone]

## 2023-12-05 ENCOUNTER — Encounter: Payer: Self-pay | Admitting: Family Medicine

## 2023-12-05 ENCOUNTER — Ambulatory Visit: Payer: Medicare Other | Admitting: Family Medicine

## 2023-12-05 VITALS — BP 110/70 | HR 64 | Temp 98.1°F | Ht 72.0 in | Wt 152.0 lb

## 2023-12-05 DIAGNOSIS — I10 Essential (primary) hypertension: Secondary | ICD-10-CM

## 2023-12-05 DIAGNOSIS — G620 Drug-induced polyneuropathy: Secondary | ICD-10-CM | POA: Diagnosis not present

## 2023-12-05 DIAGNOSIS — C7800 Secondary malignant neoplasm of unspecified lung: Secondary | ICD-10-CM

## 2023-12-05 DIAGNOSIS — K029 Dental caries, unspecified: Secondary | ICD-10-CM

## 2023-12-05 DIAGNOSIS — C20 Malignant neoplasm of rectum: Secondary | ICD-10-CM | POA: Diagnosis not present

## 2023-12-05 DIAGNOSIS — T451X5A Adverse effect of antineoplastic and immunosuppressive drugs, initial encounter: Secondary | ICD-10-CM

## 2023-12-05 DIAGNOSIS — Z7689 Persons encountering health services in other specified circumstances: Secondary | ICD-10-CM

## 2023-12-05 NOTE — Telephone Encounter (Signed)
Oral Oncology Patient Advocate Encounter  Called and left vm with patient's daughter to check if POI had successfully been faxed to program.  Called and followed up with program as well. They have not yet received any POI for this patient. Application will be pending until this is received.  Jinger Neighbors, CPhT-Adv Oncology Pharmacy Patient Advocate [facility] Direct Number: [phone]  Fax: [phone]

## 2023-12-05 NOTE — Progress Notes (Signed)
I,Jameka J Llittleton, CMA,acting as a Neurosurgeon for [employer], NP.,have documented all relevant documentation on the behalf of Ellender Hose, NP,as directed by  Ellender Hose, NP while in the presence of Ellender Hose, NP.  Subjective:  Patient ID: Jorge [name] , male    DOB: 12/14/1950 , 73 y.o.   MRN: [id number]  Chief Complaint  Patient presents with   Establish Care   Hypertension    HPI  Patient is a 73 year old male who presents to establish care. He has a diagnosis of rectal adenocarcinoma with metastasis to lungs and peripheral neuropathy d/t chemotherapy, patient is still on chemotherapy and he is followed by oncology Dr Mosetta Putt. Patient was in the hospital on 11/24/2023 for pneumonia and  wide complex tachycardia, he had cardioversion and was put on Metoprolol 25 mg BID per cardiology. Per hospital notes, he declined SNF recommendation as suggested by PT/OT , patient is a poor historian, he could not give details of the medications that he takes and he came for his visit alone, he states he will bring all his bottles of medication next time.  Hypertension Pertinent negatives include no chest pain, palpitations or shortness of breath.     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     Family History  Problem Relation Age of Onset   Cancer Mother 80       unknown type cancer   Colon cancer Neg Hx    Esophageal cancer Neg Hx    Rectal cancer Neg Hx    Stomach cancer Neg Hx    Inflammatory bowel disease Neg Hx    Liver disease Neg Hx    Pancreatic cancer Neg Hx      Current Outpatient Medications:    amLODipine (NORVASC) 10 MG tablet, Take 1 tablet (10 mg total) by mouth daily., Disp: 90 tablet, Rfl: 1   diphenoxylate-atropine (LOMOTIL) 2.5-0.025 MG tablet, Take 1-2 tablets by mouth 4 (four) times daily as needed for diarrhea or loose stools., Disp: 120 tablet, Rfl: 2   gabapentin (NEURONTIN)  100 MG capsule, Take 2 capsules (200 mg total) by mouth 3 (three) times daily., Disp: 180 capsule, Rfl: 2   metoprolol tartrate (LOPRESSOR) 25 MG tablet, Take 1 tablet (25 mg total) by mouth 2 (two) times daily., Disp: 60 tablet, Rfl: 0   oxyCODONE (OXY IR/ROXICODONE) 5 MG immediate release tablet, Take 1 tablet (5 mg total) by mouth every 6 (six) hours as needed for severe pain (pain score 7-10) or breakthrough pain., Disp: 60 tablet, Rfl: 0   pantoprazole (PROTONIX) 40 MG tablet, Take 1 tablet (40 mg total) by mouth daily before breakfast., Disp: 90 tablet, Rfl: 0   prochlorperazine (COMPAZINE) 10 MG tablet, Take 1 tablet (10 mg total) by mouth every 6 (six) hours as needed (Nausea or vomiting)., Disp: 30 tablet, Rfl: 1   Pseudoephedrine-APAP-DM (DAYQUIL PO), Take 1 tablet by mouth as needed., Disp: , Rfl:    Multiple Vitamin (MULTIVITAMIN WITH MINERALS) TABS tablet, Take 1 tablet by mouth daily. (Patient not taking: Reported on 11/24/2023), Disp: , Rfl:    ondansetron (ZOFRAN) 8 MG tablet, Take 1 tablet (8 mg total) by mouth every 8 (eight) hours as needed for nausea or vomiting. (Patient not taking: Reported on 11/24/2023), Disp: 30 tablet, Rfl: 3   trifluridine-tipiracil (LONSURF) 20-8.19 MG tablet, Take 3 tablets (60 mg of trifluridine total) by mouth 2 (two) times  daily after a meal. Take within 1 hr after AM & PM meals on days 1-5, 8-12. Repeat every 28 days. (Patient not taking: Reported on 11/24/2023), Disp: 60 tablet, Rfl: 0   No Known Allergies   Review of Systems  Constitutional: Negative.   HENT:  Positive for dental problem.        Lost all his teeth  Eyes: Negative.   Respiratory:  Negative for shortness of breath and wheezing.   Cardiovascular:  Positive for leg swelling. Negative for chest pain and palpitations.  Gastrointestinal: Negative.   Musculoskeletal: Negative.   Skin: Negative.   Psychiatric/Behavioral: Negative.       Today's Vitals   12/05/23 1105  BP: 110/70   Pulse: 64  Temp: 98.1 F (36.7 C)  TempSrc: Oral  Weight: 152 lb (68.9 kg)  Height: 6' (1.829 m)  PainSc: 0-No pain   Body mass index is 20.61 kg/m.  Wt Readings from Last 3 Encounters:  12/05/23 152 lb (68.9 kg)  11/24/23 131 lb 13.4 oz (59.8 kg)  11/14/23 140 lb 1.6 oz (63.5 kg)    The ASCVD Risk score (Arnett DK, et al., 2019) failed to calculate for the following reasons:   Cannot find a previous HDL lab   Cannot find a previous total cholesterol lab  Objective:  Physical Exam HENT:     Head: Normocephalic.     Mouth/Throat:     Dentition: Abnormal dentition.  Pulmonary:     Effort: Pulmonary effort is normal.     Breath sounds: Normal breath sounds.  Abdominal:     General: Bowel sounds are normal.  Musculoskeletal:     Right lower leg: Edema present.     Left lower leg: Edema present.  Skin:    General: Skin is warm and dry.     Comments: Port-a-cath in right chest  Neurological:     Mental Status: He is alert and oriented to person, place, and time.         Assessment And Plan:  Establishing care with new doctor, encounter for  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 [facility]) Assessment & Plan: Followed by Dr Mosetta Putt, Oncologist for chemotherapy  Orders: -     AMB Referral [facility] Care Management  Peripheral neuropathy due to chemotherapy [facility]) Assessment & Plan: Gets pain medicine from Dr Mosetta Putt for pain management.   Dental caries -     Ambulatory referral to Dentistry  Primary hypertension Assessment & Plan: Continue Amlodipine 10 mg every day , Metoprolol 25 mg twice daily.     Return in about 3 months (around 03/04/2024) for  blood pressure.  Patient was given opportunity to ask questions. Patient verbalized understanding of the plan and was able to repeat key elements of the plan. All questions were answered to their satisfaction.    I, Ellender Hose, NP, have reviewed all documentation for this visit. The documentation on 12/11/2023 for the  exam, diagnosis, procedures, and orders are all accurate and complete.    IF YOU HAVE BEEN REFERRED TO A SPECIALIST, IT MAY TAKE 1-2 WEEKS TO SCHEDULE/PROCESS THE REFERRAL. IF YOU HAVE NOT HEARD FROM US/SPECIALIST IN TWO WEEKS, PLEASE GIVE [place] A CALL AT [phone].

## 2023-12-05 NOTE — Patient Instructions (Signed)
Hypertension, Adult Hypertension is another name for high blood pressure. High blood pressure forces your heart to work harder to pump blood. This can cause problems over time. There are two numbers in a blood pressure reading. There is a top number (systolic) over a bottom number (diastolic). It is best to have a blood pressure that is below 120/80. What are the causes? The cause of this condition is not known. Some other conditions can lead to high blood pressure. What increases the risk? Some lifestyle factors can make you more likely to develop high blood pressure: Smoking. Not getting enough exercise or physical activity. Being overweight. Having too much fat, sugar, calories, or salt (sodium) in your diet. Drinking too much alcohol. Other risk factors include: Having any of these conditions: Heart disease. Diabetes. High cholesterol. Kidney disease. Obstructive sleep apnea. Having a family history of high blood pressure and high cholesterol. Age. The risk increases with age. Stress. What are the signs or symptoms? High blood pressure may not cause symptoms. Very high blood pressure (hypertensive crisis) may cause: Headache. Fast or uneven heartbeats (palpitations). Shortness of breath. Nosebleed. Vomiting or feeling like you may vomit (nauseous). Changes in how you see. Very bad chest pain. Feeling dizzy. Seizures. How is this treated? This condition is treated by making healthy lifestyle changes, such as: Eating healthy foods. Exercising more. Drinking less alcohol. Your doctor may prescribe medicine if lifestyle changes do not help enough and if: Your top number is above 130. Your bottom number is above 80. Your personal target blood pressure may vary. Follow these instructions at home: Eating and drinking  If told, follow the DASH eating plan. To follow this plan: Fill one half of your plate at each meal with fruits and vegetables. Fill one fourth of your plate  at each meal with whole grains. Whole grains include whole-wheat pasta, brown rice, and whole-grain bread. Eat or drink low-fat dairy products, such as skim milk or low-fat yogurt. Fill one fourth of your plate at each meal with low-fat (lean) proteins. Low-fat proteins include fish, chicken without skin, eggs, beans, and tofu. Avoid fatty meat, cured and processed meat, or chicken with skin. Avoid pre-made or processed food. Limit the amount of salt in your diet to less than 1,500 mg each day. Do not drink alcohol if: Your doctor tells you not to drink. You are pregnant, may be pregnant, or are planning to become pregnant. If you drink alcohol: Limit how much you have to: 0-1 drink a day for women. 0-2 drinks a day for men. Know how much alcohol is in your drink. In the [place] one drink equals one 12 oz bottle of beer (355 mL), one 5 oz glass of wine (148 mL), or one 1 oz glass of hard liquor (44 mL). Lifestyle  Work with your doctor to stay at a healthy weight or to lose weight. Ask your doctor what the best weight is for you. Get at least 30 minutes of exercise that causes your heart to beat faster (aerobic exercise) most days of the week. This may include walking, swimming, or biking. Get at least 30 minutes of exercise that strengthens your muscles (resistance exercise) at least 3 days a week. This may include lifting weights or doing Pilates. Do not smoke or use any products that contain nicotine or tobacco. If you need help quitting, ask your doctor. Check your blood pressure at home as told by your doctor. Keep all follow-up visits. Medicines Take over-the-counter and prescription medicines  only as told by your doctor. Follow directions carefully. Do not skip doses of blood pressure medicine. The medicine does not work as well if you skip doses. Skipping doses also puts you at risk for problems. Ask your doctor about side effects or reactions to medicines that you should watch  for. Contact a doctor if: You think you are having a reaction to the medicine you are taking. You have headaches that keep coming back. You feel dizzy. You have swelling in your ankles. You have trouble with your vision. Get help right away if: You get a very bad headache. You start to feel mixed up (confused). You feel weak or numb. You feel faint. You have very bad pain in your: Chest. Belly (abdomen). You vomit more than once. You have trouble breathing. These symptoms may be an emergency. Get help right away. Call 911. Do not wait to see if the symptoms will go away. Do not drive yourself to the hospital. Summary Hypertension is another name for high blood pressure. High blood pressure forces your heart to work harder to pump blood. For most people, a normal blood pressure is less than 120/80. Making healthy choices can help lower blood pressure. If your blood pressure does not get lower with healthy choices, you may need to take medicine. This information is not intended to replace advice given to you by your health care provider. Make sure you discuss any questions you have with your health care provider. Document Revised: 08/11/2021 Document Reviewed: 08/11/2021 Elsevier Patient Education  2024 [employer].

## 2023-12-06 ENCOUNTER — Other Ambulatory Visit: Payer: Medicare Other

## 2023-12-06 ENCOUNTER — Telehealth: Payer: Self-pay | Admitting: *Deleted

## 2023-12-06 ENCOUNTER — Ambulatory Visit: Payer: Medicare Other

## 2023-12-06 ENCOUNTER — Ambulatory Visit: Payer: Medicare Other | Admitting: Nurse Practitioner

## 2023-12-06 NOTE — Progress Notes (Signed)
Complex Care Management Note  Care Guide Note 12/06/2023 Name: Jorge [name] MRN: [id number] DOB: August 19, 1951  Jorge [name] is a 73 y.o. year old male who sees Ellender Hose, NP for primary care. I reached out to Clearence Cheek by phone today to offer complex care management services.  [name] was given information about Complex Care Management services today including:   The Complex Care Management services include support from the care team which includes your Nurse Coordinator, Clinical Social Worker, or Pharmacist.  The Complex Care Management team is here to help remove barriers to the health concerns and goals most important to you. Complex Care Management services are voluntary, and the patient may decline or stop services at any time by request to their care team member.   Complex Care Management Consent Status: Patient agreed to services and verbal consent obtained.   Follow up plan:  Telephone appointment with complex care management team member scheduled for:  1/31 with RNCM and 2/7 with SW .  Encounter Outcome:  Patient Scheduled  Gwenevere Ghazi  [facility]  [facility], [facility] Guide  Direct Dial: [phone]  Fax [phone]

## 2023-12-07 ENCOUNTER — Ambulatory Visit: Payer: Self-pay

## 2023-12-07 NOTE — Patient Instructions (Addendum)
Visit Information  Thank you for taking time to visit with me today. Please don't hesitate to contact me if I can be of assistance to you.   Following are the goals we discussed today:   Goals Addressed             This Visit's Progress    RN Care Coordination Activities: further follow up needed       Care Coordination Interventions: Completed successful outbound call with daughter Hardeep Reetz  Evaluation of current treatment plan related to s/p Influenza/Pneumonia and patient's adherence to plan as established by provider Reviewed and discussed with daughter patients recent hospitalization for Atypical Pneumonia Review of patient status, including review of consultant's reports, relevant laboratory and other test results, and medications completed Assessed for hydration and nutrition status, patient has regained his appetite and is eating a well balanced diet Educated daughter about the importance of hydration and to have patient aim for drinking 48-64 oz of water daily unless otherwise directed Educated daughter with rationale, how to utilize deep breathing exercises to help keep lungs expanded and or moved trapped air, instructed on pursed lip breathing, mailed printed educational material Discussed patient is receiving in home PT/OT twice weekly and adhering to his HEP, daughter reports having no home safety concerns Reviewed and discussed PCP referrals for Dentistry and Hospice, daughter is unsure why a hospice referral was placed, they are working with Jake Samples NP for palliative care through the [facility] Educated daughter about the referral process and next steps Reviewed and discussed scheduled telephone outreach with Jenel Lucks LCSW scheduled for 12/14/23 @09 :00 AM Discussed plans with patient for ongoing care coordination follow up and provided patient with direct contact information for nurse care coordinator        To receive assistance with cost of new Cancer drug        Care Coordination Interventions: Assessed patient understanding of cancer diagnosis and recommended treatment plan Reviewed upcoming provider appointments and treatment appointments Assessed available transportation to appointments and treatments. Has consistent/reliable transportation: Yes Assessed support system. Has consistent/reliable family or other support: Yes Nutrition assessment performed Determined daughter is working with a Teacher, early years/pre from the [employer] regarding financial assistance with the cost of newly prescribed Cancer treatment, trifluridine-tipiracil (LONSURF) 20-8.19 MG tablet            Our next appointment is by telephone on 12/20/23 at 1:00 PM  Please call the care guide team at [phone] if you need to cancel or reschedule your appointment.   If you are experiencing a Mental Health or Behavioral Health Crisis or need someone to talk to, please call 1-800-273-TALK (toll free, 24 hour hotline)  Patient verbalizes understanding of instructions and care plan provided today and agrees to view in MyChart. Active MyChart status and patient understanding of how to access instructions and care plan via MyChart confirmed with patient.     Delsa Sale RN BSN CCM [place]  [facility], [facility] Nurse Care Coordinator  Direct Dial: [phone] Website: [email or web address]

## 2023-12-07 NOTE — Patient Outreach (Signed)
Care Coordination   Initial Visit Note   12/07/2023 Name: Jorge [name] MRN: [id number] DOB: Jan 22, 1951  Jorge [name] is a 73 y.o. year old male who sees Ellender Hose, NP for primary care. I spoke with daughter Burle Kwan by phone today.  What matters to the patients health and wellness today?  Patient's daughter needs assistance with cost/approval of new Cancer treatment.     Goals Addressed             This Visit's Progress    RN Care Coordination Activities: further follow up needed       Care Coordination Interventions: Completed successful outbound call with daughter Georgia Baria  Evaluation of current treatment plan related to s/p Influenza/Pneumonia and patient's adherence to plan as established by provider Reviewed and discussed with daughter patients recent hospitalization for Atypical Pneumonia Review of patient status, including review of consultant's reports, relevant laboratory and other test results, and medications completed Assessed for hydration and nutrition status, patient has regained his appetite and is eating a well balanced diet Educated daughter about the importance of hydration and to have patient aim for drinking 48-64 oz of water daily unless otherwise directed Educated daughter with rationale, how to utilize deep breathing exercises to help keep lungs expanded and or moved trapped air, instructed on pursed lip breathing, mailed printed educational material Discussed patient is receiving in home PT/OT twice weekly and adhering to his HEP, daughter reports having no home safety concerns Reviewed and discussed PCP referrals for Dentistry and Hospice, daughter is unsure why a hospice referral was placed, they are working with Jake Samples NP for palliative care through the [facility] Educated daughter about the referral process and next steps Reviewed and discussed scheduled telephone outreach with Jenel Lucks LCSW scheduled for 12/14/23 @09 :00  AM Discussed plans with patient for ongoing care coordination follow up and provided patient with direct contact information for nurse care coordinator        To receive assistance with cost of new Cancer drug       Care Coordination Interventions: Assessed patient understanding of cancer diagnosis and recommended treatment plan Reviewed upcoming provider appointments and treatment appointments Assessed available transportation to appointments and treatments. Has consistent/reliable transportation: Yes Assessed support system. Has consistent/reliable family or other support: Yes Nutrition assessment performed Determined daughter is working with a Teacher, early years/pre from the [employer] regarding financial assistance with the cost of newly prescribed Cancer treatment, trifluridine-tipiracil (LONSURF) 20-8.19 MG tablet        Interventions Today    Flowsheet Row Most Recent Value  Chronic Disease   Chronic disease during today's visit Other  [s/p type A Flu,  Pneumonia]  General Interventions   General Interventions Discussed/Reviewed General Interventions Discussed, General Interventions Reviewed, Doctor Visits  Doctor Visits Discussed/Reviewed Doctor Visits Discussed, Doctor Visits Reviewed, PCP  Exercise Interventions   Exercise Discussed/Reviewed Physical Activity, Exercise Reviewed, Exercise Discussed  Physical Activity Discussed/Reviewed Physical Activity Discussed, Physical Activity Reviewed, Types of exercise  Education Interventions   Education Provided Provided Education  Provided Verbal Education On Nutrition, Labs, Medication, Exercise, When to see the doctor  Nutrition Interventions   Nutrition Discussed/Reviewed Nutrition Discussed, Nutrition Reviewed, Fluid intake  Pharmacy Interventions   Pharmacy Dicussed/Reviewed Pharmacy Topics Discussed, Pharmacy Topics Reviewed, Medications and their functions          SDOH assessments and interventions completed:  No      Care Coordination Interventions:  Yes, provided   Follow up plan: Follow up call  scheduled for 12/20/23 @1 :00 PM    Encounter Outcome:  Patient Visit Completed

## 2023-12-10 ENCOUNTER — Other Ambulatory Visit (HOSPITAL_COMMUNITY): Payer: Self-pay

## 2023-12-10 ENCOUNTER — Encounter: Payer: Self-pay | Admitting: Hematology

## 2023-12-10 NOTE — Telephone Encounter (Signed)
Oral Oncology Patient Advocate Encounter  Spoke with patient's daughter. She is emailing me the documentation. I sent her an email to her preferred address: [email or web address]    Jinger Neighbors, CPhT-Adv Oncology Pharmacy Patient Advocate [facility] Direct Number: [phone]  Fax: [phone]

## 2023-12-10 NOTE — Telephone Encounter (Signed)
Oral Oncology Patient Advocate Encounter   Submitted POI to [facility]   Application submitted via e-fax to [phone]   [facility] phone number [phone].   I will continue to check the status until final determination.   Jinger Neighbors, CPhT-Adv Oncology Pharmacy Patient Advocate [facility] Direct Number: [phone]  Fax: [phone]

## 2023-12-11 DIAGNOSIS — I10 Essential (primary) hypertension: Secondary | ICD-10-CM | POA: Insufficient documentation

## 2023-12-11 DIAGNOSIS — K029 Dental caries, unspecified: Secondary | ICD-10-CM | POA: Insufficient documentation

## 2023-12-11 NOTE — Telephone Encounter (Signed)
 Oral Oncology Patient Advocate Encounter   Received notification that the application for assistance for Lonsurf  through [employer] has been approved.   [employer] phone number [phone].   Effective dates: 12/11/23 through 11/05/24  I have spoken to the patient.  Estefana Moellers, CPhT-Adv Oncology Pharmacy Patient Advocate [facility] Direct Number: [phone]  Fax: [phone]

## 2023-12-11 NOTE — Telephone Encounter (Signed)
 Oral Oncology Patient Advocate Encounter  Received call from [employer] that application is being reviewed and POI has been received.  Estefana Moellers, CPhT-Adv Oncology Pharmacy Patient Advocate [facility] Direct Number: [phone]  Fax: [phone]

## 2023-12-11 NOTE — Assessment & Plan Note (Signed)
Continue Amlodipine 10 mg every day , Metoprolol 25 mg twice daily.

## 2023-12-11 NOTE — Assessment & Plan Note (Signed)
Followed by Dr Mosetta Putt, Oncologist for chemotherapy

## 2023-12-11 NOTE — Assessment & Plan Note (Signed)
Gets pain medicine from Dr Mosetta Putt for pain management.

## 2023-12-12 NOTE — Assessment & Plan Note (Signed)
 [id number] with lung metastasis, MMR normal, KRAS(+) [id number]  -diagnosed 08/2021 by colonoscopy for rectal bleeding and frequent BM. Staging MRI showed early extra mesorectal lymph node involvement.  -baseline CEA 571.84 on 09/06/21.  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 Xeloda  dose reduced due to elevated liver enzymes.  -due to new liver lesions, we switched to second-line FOLFIRI, with continued beva, on 02/21/22. He tolerates well overall.  - restaging CT scan on April 22 and June 26 showed stable disease overall. -He is clinically doing well, diarrhea is well-controlled, no other significant side effect from treatment.   -CT 10/09/2023 showed mild disease progression in lungs, his tumor marker CEA has been going up also.  -Plan to change his chemo to lonsurf  and beva, starting next week

## 2023-12-13 ENCOUNTER — Inpatient Hospital Stay (HOSPITAL_BASED_OUTPATIENT_CLINIC_OR_DEPARTMENT_OTHER): Payer: Medicare Other | Admitting: Hematology

## 2023-12-13 ENCOUNTER — Encounter: Payer: Self-pay | Admitting: Hematology

## 2023-12-13 ENCOUNTER — Other Ambulatory Visit: Payer: Self-pay

## 2023-12-13 ENCOUNTER — Inpatient Hospital Stay: Payer: Medicare Other | Attending: Physician Assistant

## 2023-12-13 ENCOUNTER — Inpatient Hospital Stay: Payer: Medicare Other

## 2023-12-13 VITALS — BP 146/96 | HR 86 | Temp 98.3°F | Resp 15 | Wt 145.9 lb

## 2023-12-13 DIAGNOSIS — C7801 Secondary malignant neoplasm of right lung: Secondary | ICD-10-CM | POA: Insufficient documentation

## 2023-12-13 DIAGNOSIS — C20 Malignant neoplasm of rectum: Secondary | ICD-10-CM | POA: Insufficient documentation

## 2023-12-13 DIAGNOSIS — Z515 Encounter for palliative care: Secondary | ICD-10-CM

## 2023-12-13 DIAGNOSIS — R197 Diarrhea, unspecified: Secondary | ICD-10-CM | POA: Diagnosis not present

## 2023-12-13 DIAGNOSIS — Z95828 Presence of other vascular implants and grafts: Secondary | ICD-10-CM

## 2023-12-13 DIAGNOSIS — Z5112 Encounter for antineoplastic immunotherapy: Secondary | ICD-10-CM | POA: Diagnosis present

## 2023-12-13 DIAGNOSIS — C7800 Secondary malignant neoplasm of unspecified lung: Secondary | ICD-10-CM

## 2023-12-13 LAB — CMP (CANCER CENTER ONLY)
ALT: 16 U/L (ref 0–44)
AST: 25 U/L (ref 15–41)
Albumin: 3.1 g/dL — ABNORMAL LOW (ref 3.5–5.0)
Alkaline Phosphatase: 121 U/L (ref 38–126)
Anion gap: 1 — ABNORMAL LOW (ref 5–15)
BUN: 11 mg/dL (ref 8–23)
CO2: 32 mmol/L (ref 22–32)
Calcium: 8.8 mg/dL — ABNORMAL LOW (ref 8.9–10.3)
Chloride: 104 mmol/L (ref 98–111)
Creatinine: 0.73 mg/dL (ref 0.61–1.24)
GFR, Estimated: >60 mL/min (ref >60)
Glucose, Bld: 87 mg/dL (ref 70–99)
Potassium: 4.1 mmol/L (ref 3.5–5.1)
Sodium: 137 mmol/L (ref 135–145)
Total Bilirubin: 0.5 mg/dL (ref 0.0–1.2)
Total Protein: 7 g/dL (ref 6.5–8.1)

## 2023-12-13 LAB — CBC WITH DIFFERENTIAL (CANCER CENTER ONLY)
Abs Immature Granulocytes: 0.01 10*3/uL (ref 0.00–0.07)
Basophils Absolute: 0 10*3/uL (ref 0.0–0.1)
Basophils Relative: 1 %
Eosinophils Absolute: 0.1 10*3/uL (ref 0.0–0.5)
Eosinophils Relative: 3 %
HCT: 37.3 % — ABNORMAL LOW (ref 39.0–52.0)
Hemoglobin: 12.5 g/dL — ABNORMAL LOW (ref 13.0–17.0)
Immature Granulocytes: 0 %
Lymphocytes Relative: 31 %
Lymphs Abs: 1.3 10*3/uL (ref 0.7–4.0)
MCH: 30.6 pg (ref 26.0–34.0)
MCHC: 33.5 g/dL (ref 30.0–36.0)
MCV: 91.2 fL (ref 80.0–100.0)
Monocytes Absolute: 0.8 10*3/uL (ref 0.1–1.0)
Monocytes Relative: 19 %
Neutro Abs: 1.9 10*3/uL (ref 1.7–7.7)
Neutrophils Relative %: 46 %
Platelet Count: 151 10*3/uL (ref 150–400)
RBC: 4.09 MIL/uL — ABNORMAL LOW (ref 4.22–5.81)
RDW: 19.6 % — ABNORMAL HIGH (ref 11.5–15.5)
WBC Count: 4.2 10*3/uL (ref 4.0–10.5)
nRBC: 0 % (ref 0.0–0.2)

## 2023-12-13 LAB — TOTAL PROTEIN, URINE DIPSTICK: Protein, ur: 100 mg/dL — AB

## 2023-12-13 MED ORDER — HEPARIN SOD (PORK) LOCK FLUSH 100 UNIT/ML IV SOLN: 500.0000 [IU] | Freq: Once | INTRAVENOUS | Status: DC | PRN

## 2023-12-13 MED ORDER — SODIUM CHLORIDE 0.9 % IV SOLN
INTRAVENOUS | Status: DC
Start: 2023-12-13 — End: 2023-12-13

## 2023-12-13 MED ORDER — DIPHENOXYLATE-ATROPINE 2.5-0.025 MG PO TABS: 1.0000 | ORAL_TABLET | Freq: Four times a day (QID) | ORAL | 1 refills | Status: DC | PRN

## 2023-12-13 MED ORDER — AMLODIPINE BESYLATE 10 MG PO TABS: 10.0000 mg | ORAL_TABLET | Freq: Every day | ORAL | 1 refills | Status: DC

## 2023-12-13 MED ORDER — SODIUM CHLORIDE 0.9% FLUSH
10.0000 mL | Freq: Once | INTRAVENOUS | Status: AC
  Administered 2023-12-13: 10 mL

## 2023-12-13 MED ORDER — SODIUM CHLORIDE 0.9 % IV SOLN
5.0000 mg/kg | Freq: Once | INTRAVENOUS | Status: AC
  Administered 2023-12-13: 325 mg via INTRAVENOUS
  Filled 2023-12-13: qty 13

## 2023-12-13 MED ORDER — SODIUM CHLORIDE 0.9% FLUSH: 10.0000 mL | INTRAVENOUS | Status: DC | PRN

## 2023-12-13 NOTE — Progress Notes (Signed)
 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 Staging MRI showed early extra mesorectal lymph node involvement.  -baseline CEA 571.84 on 09/06/21.  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 Xeloda  dose reduced due to elevated liver enzymes.  -due to new liver lesions, we switched to second-line FOLFIRI, with continued beva, on 02/21/22. He tolerates well overall.  - restaging CT scan on April 22 and June 26 showed stable disease overall. -He is clinically doing well, diarrhea is well-controlled, no other significant side effect from treatment.   -CT 10/09/2023 showed mild disease progression in lungs, his tumor marker CEA has been going up also.  -Plan to change his chemo to lonsurf  and beva, starting next week    Assessment and Plan    Metastatic Colon Cancer Follow-up for metastatic colon cancer. Transitioning to oral chemotherapy (long serve), expected next week. Dosage: three tablets twice daily after meals for five days, followed by a two-day break, then repeat. Continuing bevacizumab  infusions biweekly. CT scan of the abdomen and pelvis planned for new baseline. Discussed that oral chemotherapy is expected to be easier with shorter infusion  times. Patient agrees with the treatment plan. - Start oral chemotherapy upon receipt - Administer bevacizumab  infusion biweekly - Order CT scan of the abdomen and pelvis within two weeks - Schedule follow-up in two and a half weeks  Loose Bowel Movements Reports loose bowel movements once daily. Lomotil  not required. - Discontinue Lomotil   Hypertension On metoprolol  and amlodipine  for hypertension. Both medications necessary for effective blood pressure control. - Continue metoprolol  - Continue amlodipine   General Health Maintenance Inquired about pantoprazole  for acid reflux and medication for nausea and vomiting. No symptoms present. - Discontinue pantoprazole  - Discontinue medication for nausea and vomiting  Plan -Lab reviewed, adequate for treatment, will proceed with bevacizumab  today -He will receive Lonsurf  next week and start as soon as he received - Schedule follow-up in two and a half weeks - Arrange CT scan of the abdomen and pelvis within two weeks as his new baseline        SUMMARY OF ONCOLOGIC HISTORY: Oncology History Overview Note  Cancer Staging Rectal cancer [facility]) Staging form: Colon and Rectum, AJCC 8th Edition - Clinical stage from 08/29/2021: [name], cM1 - Signed by Lanny Callander, MD on 08/29/2021    Rectal cancer (HCC)  07/23/2021 Imaging   CT AP  IMPRESSION: 1. Appearance of the rectum likely indicates a rectal mass suspicious for rectal carcinoma. Inflammatory process would be less likely. Direct visualization is suggested. 2. Cholelithiasis without evidence of acute cholecystitis. 3. Aortic atherosclerosis.   08/19/2021 Procedure   Colonoscopy, under Dr. Wilhelmenia  Impression: - Rectal tenderness, palpable rectal mass and hemorrhoids found on digital rectal exam. - Stool in the entire examined colon - lavaged with still inadequate clearance. - One 35 mm polyp  at the hepatic flexure. Biopsied. Tattooed distal in case future endoscopic  resection is considered - however, has larger issues with rectal mass currently as below. - Four, 5 to 11 mm polyps in the transverse colon and at the hepatic flexure, removed with a cold snare. Resected and retrieved. - Diverticulosis in the recto-sigmoid colon and in the sigmoid colon. - Rule out malignancy, partially obstructing tumor in the rectum and from 6 to 13 cm proximal to the anus. Biopsied.   08/19/2021 Pathology Results   Diagnosis 1. Hepatic Flexure Biopsy - TUBULAR ADENOMA WITHOUT HIGH-GRADE DYSPLASIA OR MALIGNANCY 2. Transverse Colon Biopsy, and hepatic flexure, polyps (4) - TUBULAR ADENOMA WITHOUT HIGH-GRADE DYSPLASIA OR MALIGNANCY - OTHER FRAGMENTS OF POLYPOID COLONIC MUCOSA WITH NO SPECIFIC HISTOPATHOLOGIC CHANGES - FOOD MATERIAL 3. Rectum, biopsy - ADENOCARCINOMA. SEE NOTE   08/22/2021 Initial Diagnosis   Rectal cancer (HCC)   08/26/2021 Imaging   IMPRESSION: 1. A 2.2 x 1.9 cm right middle lobe pulmonary nodule as well as a total of four right upper lobe pulmonary nodule and masses measuring up to 3.6 cm. Findings concerning for metastatic primary lung cancer versus less likely metastases in a patient with rectal cancer. Additional imaging evaluation or consultation with Pulmonology or Thoracic Surgery recommended. 2. No gross hilar adenopathy, noting limited sensitivity for the detection of hilar adenopathy on this noncontrast study. 3. Cholelithiasis. 4.  Emphysema (ICD10-J43.9). 5. At least left anterior descending coronary artery calcifications.   08/27/2021 Imaging   IMPRESSION: Rectal adenocarcinoma T stage: T4 B   Rectal adenocarcinoma N stage: N2 disease likely associated with early extra mesorectal lymph node involvement.   Distance from tumor to the internal anal sphincter is 1.2 cm.   Also with extramural venous involvement as described.   08/29/2021 Cancer Staging   Staging form: Colon and Rectum, AJCC 8th Edition - Clinical stage from  08/29/2021: [name], cM1 - Signed by Lanny Callander, MD on 08/29/2021 Stage prefix: Initial diagnosis   01/23/2022 Imaging   CT CAP w contrast IMPRESSION: 1. Mild interval decrease in size of multiple right-sided pulmonary nodules. No new suspicious pulmonary nodule or mass. 2. Interval development of approximately 10 new ill-defined hypoattenuating liver lesions ranging in size from approximately 5-10 mm. Imaging features suspicious for metastatic disease. MRI abdomen with and without contrast may prove helpful to further evaluate. 3. Similar appearance of wall thickening in the rectum with perirectal edema. 4. Cholelithiasis. 5. Prostatomegaly. 6. Aortic Atherosclerosis (ICD10-I70.0).   11/21/2022 Imaging    IMPRESSION: 1. Stable exam. No new or progressive interval findings. 2. The primary rectosigmoid lesion described previously is not well seen on the current study. There is presacral and perirectal edema, likely treatment related. 3. No substantial change in appearance of bilateral pulmonary metastases. 4. Stable tiny hypodensities in both hepatic lobes. These were previously characterized as metastatic disease. No new liver lesions on today's exam. 5. Cholelithiasis. 6. 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  History of Present Illness   The patient, a 73 year old with metastatic colon cancer, recently recovered from a severe flu infection that required hospitalization. The patient describes the hospital stay as scary due to confusion and uncertainty about his condition. The patient believes he contracted the flu from family members who were also sick. The patient reports that the cough associated with the flu has resolved, but there is still some phlegm production.  The patient is familiar with the dosing schedule for the oral chemotherapy.  The patient also discusses various other medications. He is unsure about the need for amlodipine  and metoprolol , both of which are for blood pressure control. The patient also questions the need for pantoprazole , which is for acid reflux, and lomotil , which is for diarrhea. The patient reports that his bowel movements are once a day and can be loose or hard.  The patient's energy level is reported to be good, and he has been able to perform tasks such as taking out the trash. The patient does not drive and relies on family members for transportation.         All other systems were reviewed with the patient and are negative.  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    SURGICAL HISTORY: Past Surgical History:  Procedure Laterality Date   BRONCHIAL BIOPSY  09/27/2021   Procedure: BRONCHIAL BIOPSIES;  Surgeon: Brenna Adine CROME, DO;  Location: [facility];  Service: Pulmonary;;   BRONCHIAL BRUSHINGS  09/27/2021   Procedure: BRONCHIAL BRUSHINGS;  Surgeon: Brenna Adine CROME, DO;  Location: [facility];  Service: Pulmonary;;   BRONCHIAL NEEDLE ASPIRATION BIOPSY  09/27/2021   Procedure: BRONCHIAL NEEDLE ASPIRATION BIOPSIES;  Surgeon: Brenna Adine CROME, DO;  Location: [facility];   Service: Pulmonary;;   COLONOSCOPY     IR IMAGING GUIDED PORT INSERTION  10/24/2021   left breast surgery Left 1968   VIDEO BRONCHOSCOPY WITH RADIAL ENDOBRONCHIAL ULTRASOUND  09/27/2021   Procedure: RADIAL ENDOBRONCHIAL ULTRASOUND;  Surgeon: Brenna Adine CROME, DO;  Location: [facility];  Service: Pulmonary;;    I have reviewed the social history and family history with the patient and they are unchanged from previous note.  ALLERGIES:  has no known allergies.  MEDICATIONS:  Current Outpatient Medications  Medication Sig Dispense Refill   amLODipine  (NORVASC ) 10 MG tablet Take 1 tablet (10 mg total) by mouth daily. 90 tablet 1   diphenoxylate -atropine  (LOMOTIL ) 2.5-0.025 MG tablet Take 1-2 tablets by mouth 4 (four) times daily as needed for diarrhea or loose stools. 90 tablet 1   gabapentin  (NEURONTIN ) 100 MG capsule Take 2 capsules (200 mg total) by mouth 3 (three) times daily. 180 capsule 2   metoprolol  tartrate (LOPRESSOR ) 25 MG tablet Take 1 tablet (25 mg total) by mouth 2 (two) times daily. 60 tablet 0   Multiple Vitamin (MULTIVITAMIN WITH MINERALS) TABS tablet Take 1 tablet by mouth daily. (Patient not taking: Reported on 11/24/2023)     ondansetron  (ZOFRAN ) 8 MG tablet Take 1 tablet (8 mg total) by mouth every 8 (eight) hours as needed for nausea or vomiting. (Patient not taking: Reported on 11/24/2023) 30 tablet 3   oxyCODONE  (OXY IR/ROXICODONE ) 5 MG immediate release  tablet Take 1 tablet (5 mg total) by mouth every 6 (six) hours as needed for severe pain (pain score 7-10) or breakthrough pain. 60 tablet 0   pantoprazole  (PROTONIX ) 40 MG tablet Take 1 tablet (40 mg total) by mouth daily before breakfast. 90 tablet 0   prochlorperazine  (COMPAZINE ) 10 MG tablet Take 1 tablet (10 mg total) by mouth every 6 (six) hours as needed (Nausea or vomiting). 30 tablet 1   Pseudoephedrine-APAP-DM (DAYQUIL PO) Take 1 tablet by mouth as needed.     trifluridine -tipiracil  (LONSURF ) 20-8.19 MG tablet  Take 3 tablets (60 mg of trifluridine  total) by mouth 2 (two) times daily after a meal. Take within 1 hr after AM & PM meals on days 1-5, 8-12. Repeat every 28 days. (Patient not taking: Reported on 11/24/2023) 60 tablet 0   No current facility-administered medications for this visit.   Facility-Administered Medications Ordered in Other Visits  Medication Dose Route Frequency Provider Last Rate Last Admin   0.9 %  sodium chloride  infusion   Intravenous Continuous Lanny Callander, MD   Stopped at 12/13/23 1145   heparin  lock flush 100 unit/mL  500 Units Intracatheter Once PRN Lanny Callander, MD       sodium chloride  flush (NS) 0.9 % injection 10 mL  10 mL Intracatheter PRN Lanny Callander, MD        PHYSICAL EXAMINATION: ECOG PERFORMANCE STATUS: 1 - Symptomatic but completely ambulatory  Vitals:   12/13/23 0942  BP: (!) 146/96  Pulse: 86  Resp: 15  Temp: 98.3 F (36.8 C)  SpO2: 100%   Wt Readings from Last 3 Encounters:  12/13/23 66.2 kg  12/05/23 68.9 kg  11/24/23 59.8 kg     GENERAL:alert, no distress and comfortable SKIN: skin color, texture, turgor are normal, no rashes or significant lesions EYES: normal, Conjunctiva are pink and non-injected, sclera clear NECK: supple, thyroid  normal size, non-tender, without nodularity LYMPH:  no palpable lymphadenopathy in the cervical, axillary  LUNGS: clear to auscultation and percussion with normal breathing effort HEART: regular rate & rhythm and no murmurs and no lower extremity edema ABDOMEN:abdomen soft, non-tender and normal bowel sounds Musculoskeletal:no cyanosis of digits and no clubbing  NEURO: alert & oriented x 3 with fluent speech, no focal motor/sensory deficits   LABORATORY DATA:  I have reviewed the data as listed    Latest Ref Rng & Units 12/13/2023    9:27 AM 11/27/2023    5:46 AM 11/26/2023    6:20 AM  CBC  WBC 4.0 - 10.5 K/uL 4.2  4.5  5.5   Hemoglobin 13.0 - 17.0 g/dL 87.4  88.8  88.5   Hematocrit 39.0 - 52.0 % 37.3  35.6   36.4   Platelets 150 - 400 K/uL 151  301  284         Latest Ref Rng & Units 12/13/2023    9:27 AM 11/27/2023    5:46 AM 11/26/2023    6:20 AM  CMP  Glucose 70 - 99 mg/dL 87  890  97   BUN 8 - 23 mg/dL 11  10  10    Creatinine 0.61 - 1.24 mg/dL 9.26  9.61  9.04   Sodium 135 - 145 mmol/L 137  135  133   Potassium 3.5 - 5.1 mmol/L 4.1  3.9  3.3   Chloride 98 - 111 mmol/L 104  105  99   CO2 22 - 32 mmol/L 32  26  27   Calcium  8.9 - 10.3 mg/dL  8.8  8.4  7.9   Total Protein 6.5 - 8.1 g/dL 7.0  6.2  6.0   Total Bilirubin 0.0 - 1.2 mg/dL 0.5  0.4  0.8   Alkaline Phos 38 - 126 U/L 121  124  96   AST 15 - 41 U/L 25  26  26    ALT 0 - 44 U/L 16  21  23        RADIOGRAPHIC STUDIES: I have personally reviewed the radiological images as listed and agreed with the findings in the report. No results found.    Orders Placed This Encounter  Procedures   Consent Attestation for Oncology Treatment    The patient is informed of risks, benefits, side-effects of the prescribed oncology treatment. Potential short term and long term side effects and response rates discussed. After a long discussion, the patient made informed decision to proceed.:   Yes   CT ABDOMEN PELVIS W CONTRAST    Standing Status:   Future    Expected Date:   12/20/2023    Expiration Date:   12/12/2024    If indicated for the ordered procedure, I authorize the administration of contrast media per Radiology protocol:   Yes    Does the patient have a contrast media/X-ray dye allergy?:   No    Preferred imaging location?:   [facility]    If indicated for the ordered procedure, I authorize the administration of oral contrast media per Radiology protocol:   Yes   CBC with Differential (Cancer Center Only)    Standing Status:   Future    Expected Date:   12/31/2023    Expiration Date:   12/30/2024   CBC with Differential (Cancer Center Only)    Standing Status:   Future    Expected Date:   01/14/2024    Expiration Date:    01/13/2025   CBC with Differential (Cancer Center Only)    Standing Status:   Future    Expected Date:   01/28/2024    Expiration Date:   01/27/2025   PHYSICIAN COMMUNICATION ORDER    UA Protein, dipstick required prior to every 3rd cycle bevacizumab    All questions were answered. The patient knows to call the clinic with any problems, questions or concerns. No barriers to learning was detected. The total time spent in the appointment was 25 minutes.     Onita Mattock, MD 12/13/2023

## 2023-12-14 ENCOUNTER — Ambulatory Visit: Payer: Self-pay | Admitting: Licensed Clinical Social Worker

## 2023-12-14 ENCOUNTER — Other Ambulatory Visit: Payer: Self-pay | Admitting: Nurse Practitioner

## 2023-12-14 DIAGNOSIS — Z515 Encounter for palliative care: Secondary | ICD-10-CM

## 2023-12-14 DIAGNOSIS — G893 Neoplasm related pain (acute) (chronic): Secondary | ICD-10-CM

## 2023-12-14 DIAGNOSIS — C20 Malignant neoplasm of rectum: Secondary | ICD-10-CM

## 2023-12-14 MED ORDER — OXYCODONE HCL 5 MG PO TABS: 5.0000 mg | ORAL_TABLET | Freq: Four times a day (QID) | ORAL | 0 refills | Status: DC | PRN

## 2023-12-14 NOTE — Patient Instructions (Signed)
 Visit Information  Thank you for taking time to visit with me today. Please don't hesitate to contact me if I can be of assistance to you.   Following are the goals we discussed today:   Goals Addressed             This Visit's Progress    Obtain Supportive Resources-LCSW   On track    Activities and task to complete in order to accomplish goals.   Keep all upcoming appointments discussed today Continue with compliance of taking medication prescribed by Doctor Implement healthy coping skills discussed to assist with management of symptoms         Our next appointment is by telephone on 3/7 at 9:30 AM  Please call the care guide team at [phone] if you need to cancel or reschedule your appointment.   If you are experiencing a Mental Health or Behavioral Health Crisis or need someone to talk to, please call the Suicide and Crisis Lifeline: 988 call 911   Patient verbalizes understanding of instructions and care plan provided today and agrees to view in MyChart. Active MyChart status and patient understanding of how to access instructions and care plan via MyChart confirmed with patient.     Jorge [name] [facility]  [facility], [facility] Clinical Social Worker Direct Dial: [phone]  Fax: [phone] Website: [email or web address] 10:01 AM

## 2023-12-14 NOTE — Patient Outreach (Signed)
  Care Coordination   Initial Visit Note   12/14/2023 Name: Jorge [name] MRN: [id number] DOB: 04/05/1951  ADYAN [name] is a 73 y.o. year old male who sees Petrina Pries, NP for primary care. I spoke with  Ozell JINNY Dosch's daughter by phone today.  What matters to the patients health and wellness today?  Supportive Resources    Goals Addressed             This Visit's Progress    Obtain Supportive Resources-LCSW   On track    Activities and task to complete in order to accomplish goals.   Keep all upcoming appointments discussed today Continue with compliance of taking medication prescribed by Doctor Implement healthy coping skills discussed to assist with management of symptoms         SDOH assessments and interventions completed:  Yes  SDOH Interventions Today    Flowsheet Row Most Recent Value  SDOH Interventions   Food Insecurity Interventions Intervention Not Indicated  Housing Interventions Intervention Not Indicated  Transportation Interventions Intervention Not Indicated  Utilities Interventions Intervention Not Indicated        Care Coordination Interventions:  Yes, provided  Interventions Today    Flowsheet Row Most Recent Value  Chronic Disease   Chronic disease during today's visit Hypertension (HTN), Other  [Cancer]  General Interventions   General Interventions Discussed/Reviewed General Interventions Discussed, Doctor Visits, Level of Care  Doctor Visits Discussed/Reviewed Doctor Visits Reviewed  Mental Health Interventions   Mental Health Discussed/Reviewed Mental Health Discussed  Pharmacy Interventions   Pharmacy Dicussed/Reviewed Pharmacy Topics Discussed, Medication Adherence  Safety Interventions   Safety Discussed/Reviewed Safety Discussed  [Pt resides with daughter]  Advanced Directive Interventions   Advanced Directives Discussed/Reviewed End of Life  End of Life Hospice, Palliative  [LCSW discussed difference between hospice  and palliative care]       Follow up plan: Follow up call scheduled for 4 weeks    Encounter Outcome:  Patient Visit Completed   Rolin Kerns, LCSW [place]  [facility], [facility] Clinical Social Worker Direct Dial: [phone]  Fax: [phone] Website: [email or web address] 10:01 AM

## 2023-12-17 NOTE — Telephone Encounter (Signed)
 Oral Chemotherapy Pharmacist Encounter   Attempted to reach patient's daughter, Liandro Lanka, to provide update and offer for initial counseling on oral medication: Lonsurf  (trifluridine -tipiracil ).   No answer. Left voicemail for patient's daughter to call back to discuss details of medication acquisition and initial counseling session.  Jude Norton, PharmD, BCPS, BCOP Hematology/Oncology Clinical Pharmacist [facility] and [facility] Oral Chemotherapy Navigation Clinics [phone] 12/17/2023 9:23 AM

## 2023-12-18 NOTE — Telephone Encounter (Signed)
Oral Chemotherapy Pharmacist Encounter   2nd attempted to reach patient's daughter to provide update and offer for initial counseling on oral medication: Lonsurf (trifluridine-tipiracil).   No answer. Left voicemail for patient's daughter to call back to discuss details of medication acquisition and initial counseling session.  Sherry Ruffing, PharmD, BCPS, BCOP Hematology/Oncology Clinical Pharmacist [facility] and [facility] Oral Chemotherapy Navigation Clinics [phone] 12/18/2023 11:55 AM

## 2023-12-19 ENCOUNTER — Other Ambulatory Visit: Payer: Self-pay

## 2023-12-19 ENCOUNTER — Other Ambulatory Visit (HOSPITAL_COMMUNITY): Payer: Self-pay

## 2023-12-19 NOTE — Telephone Encounter (Signed)
Oral Chemotherapy Pharmacist Encounter   I spoke with patient's daughter, Asael Pann, for overview of: Lonsurf (trifluridine/tipiracil) for the treatment of metastatic colon cancer in conjunction with bevacizumab, planned duration until disease progression or unacceptable drug toxicity.    Counseled on administration, dosing, side effects, monitoring, drug-food interactions, safe handling, storage, and disposal.   Patient will take Lonsurf 20mg  (trifluridine component) tablets, 3 tablets (60mg  trifluridine) by mouth twice daily, within 1 hour of finishing AM & PM meals, on days 1-5 and days 8-12, every 28 days.  Lonsurf start date: 12/15/23  Patient's daughter aware for this first cycle, since he started on 12/15/23, that he will take Lonsurf 2/8-2/12, OFF 2/12-2/14, resume Lonsurf 2/15-2/19, and then off 2/20-3/7. Patient's daughter has good understanding of this plan.   Adverse effects include but are not limited to: fatigue, nausea, vomiting, diarrhea, and decreased blood counts.   N/V: Patient has anti-emetic on hand and knows to take it if nausea develops.   Diarrhea: Patient will obtain anti diarrheal and alert the office of 4 or more loose stools above baseline.  Reviewed importance of keeping a medication schedule and plan for any missed doses. No barriers to medication adherence identified.   Medication reconciliation performed and medication/allergy list updated.  All questions answered.  Patient's daugther voiced understanding and appreciation.    Medication education handout placed in mail for patient and patient's family. Patient and patient's family know to call the office with questions or concerns. Oral Chemotherapy Clinic phone number provided.    Sherry Ruffing, PharmD, BCPS, BCOP Hematology/Oncology Clinical Pharmacist [facility] and [facility] Oral Chemotherapy Navigation Clinics [phone] 12/19/2023 9:48 AM

## 2023-12-20 ENCOUNTER — Other Ambulatory Visit: Payer: Medicare Other

## 2023-12-20 ENCOUNTER — Ambulatory Visit: Payer: Medicare Other | Admitting: Hematology

## 2023-12-20 ENCOUNTER — Ambulatory Visit (HOSPITAL_COMMUNITY)
Admission: RE | Admit: 2023-12-20 | Discharge: 2023-12-20 | Disposition: A | Discharge status: Home / Self Care | Payer: Medicare Other | Source: Ambulatory Visit | Attending: Hematology | Admitting: Hematology

## 2023-12-20 ENCOUNTER — Ambulatory Visit: Payer: Self-pay

## 2023-12-20 ENCOUNTER — Ambulatory Visit: Payer: Medicare Other

## 2023-12-20 DIAGNOSIS — C20 Malignant neoplasm of rectum: Secondary | ICD-10-CM | POA: Diagnosis present

## 2023-12-20 MED ORDER — HEPARIN SOD (PORK) LOCK FLUSH 100 UNIT/ML IV SOLN
INTRAVENOUS | Status: AC
  Filled 2023-12-20: qty 5

## 2023-12-20 MED ORDER — HEPARIN SOD (PORK) LOCK FLUSH 100 UNIT/ML IV SOLN
500.0000 [IU] | Freq: Once | INTRAVENOUS | Status: AC
  Administered 2023-12-20: 500 [IU] via INTRAVENOUS

## 2023-12-20 MED ORDER — IOHEXOL 300 MG/ML  SOLN
100.0000 mL | Freq: Once | INTRAMUSCULAR | Status: AC | PRN
Start: 2023-12-20 — End: 2023-12-20
  Administered 2023-12-20: 100 mL via INTRAVENOUS

## 2023-12-20 MED ORDER — IOHEXOL 300 MG/ML  SOLN
30.0000 mL | Freq: Once | INTRAMUSCULAR | Status: AC | PRN
Start: 2023-12-20 — End: 2023-12-20
  Administered 2023-12-20: 30 mL via ORAL

## 2023-12-21 NOTE — Patient Instructions (Signed)
Visit Information  Thank you for taking time to visit with me today. Please don't hesitate to contact me if I can be of assistance to you.   Following are the goals we discussed today:   Goals Addressed             This Visit's Progress    RN Care Coordination Activities: further follow up needed   On track    Care Coordination Interventions: Completed successful outbound call with daughter Gauge Winski  Evaluation of current treatment plan related to s/p Influenza/Pneumonia and patient's adherence to plan as established by provider Discussed with daughter patient continues to receive in home PT/OT twice weekly and adhering to his HEP, daughter reports having no home safety concerns Educated on the importance of ongoing use of incentive spirometer to help keep patient's lungs expanded, move trapped air and help clear pneumonia Discussed plans with patient for ongoing care coordination follow up and provided patient with direct contact information for nurse care coordinator Patient will continue to adhere to his HEP as directed by PT/OP Patient will continue to use deep breathing exercises as directed Patient will continue to take his medications exactly as prescribed  Patient/daughter will continue to work with nurse care coordinator with next upcoming scheduled follow up call scheduled for 01/18/24@1 :30 PM     To receive assistance with cost of new Cancer drug   On track    Care Coordination Interventions: Assessed patient understanding of cancer diagnosis and recommended treatment plan Reviewed upcoming provider appointments and treatment appointments Assessed available transportation to appointments and treatments. Has consistent/reliable transportation: Yes Assessed support system. Has consistent/reliable family or other support: Yes Nutrition assessment performed Discussed plans with patient for ongoing care coordination follow up and provided patient with direct contact  information for nurse care coordinator Confirmed patient received financial assistance with Lonsurf, he has started this medication as prescribed  Patient will continue to adhere to his Cancer treatment as prescribed Patient/daughter will report new symptoms or concerns to his health care provider promptly  Patient/daughter will continue to work with nurse care coordinator with next upcoming call scheduled for 01/18/24 @1 :30 PM        Our next appointment is by telephone on 01/18/24 at 1:30 PM  Please call the care guide team at [phone] if you need to cancel or reschedule your appointment.   If you are experiencing a Mental Health or Behavioral Health Crisis or need someone to talk to, please call 1-800-273-TALK (toll free, 24 hour hotline)  Patient verbalizes understanding of instructions and care plan provided today and agrees to view in MyChart. Active MyChart status and patient understanding of how to access instructions and care plan via MyChart confirmed with patient.     Delsa Sale RN BSN CCM [place]  [facility], [facility] Nurse Care Coordinator  Direct Dial: [phone] Website: [email or web address]

## 2023-12-21 NOTE — Patient Outreach (Signed)
Care Coordination   Follow Up Visit Note   12/20/2023 Name: Jorge [name] MRN: [id number] DOB: 01-11-1951  Jorge [name] is a 73 y.o. year old male who sees Ellender Hose, NP for primary care. I spoke with daughter Jorge [name] by phone today.  What matters to the patients health and wellness today?  Patient would like to continue to making a full recovery from recent pneumonia.     Goals Addressed             This Visit's Progress    RN Care Coordination Activities: further follow up needed   On track    Care Coordination Interventions: Completed successful outbound call with daughter Jorge [name]  Evaluation of current treatment plan related to s/p Influenza/Pneumonia and patient's adherence to plan as established by provider Discussed with daughter patient continues to receive in home PT/OT twice weekly and adhering to his HEP, daughter reports having no home safety concerns Educated on the importance of ongoing use of incentive spirometer to help keep patient's lungs expanded, move trapped air and help clear pneumonia Discussed plans with patient for ongoing care coordination follow up and provided patient with direct contact information for nurse care coordinator Patient will continue to adhere to his HEP as directed by PT/OP Patient will continue to use deep breathing exercises as directed Patient will continue to take his medications exactly as prescribed  Patient/daughter will continue to work with nurse care coordinator with next upcoming scheduled follow up call scheduled for 01/18/24@1 :30 PM        To receive assistance with cost of new Cancer drug   On track    Care Coordination Interventions: Assessed patient understanding of cancer diagnosis and recommended treatment plan Reviewed upcoming provider appointments and treatment appointments Assessed available transportation to appointments and treatments. Has consistent/reliable transportation: Yes Assessed support  system. Has consistent/reliable family or other support: Yes Nutrition assessment performed Discussed plans with patient for ongoing care coordination follow up and provided patient with direct contact information for nurse care coordinator Confirmed patient received financial assistance with Lonsurf, he has started this medication as prescribed  Patient will continue to adhere to his Cancer treatment as prescribed Patient/daughter will report new symptoms or concerns to his health care provider promptly  Patient/daughter will continue to work with nurse care coordinator with next upcoming call scheduled for 01/18/24 @1 :30 PM    Interventions Today    Flowsheet Row Most Recent Value  Chronic Disease   Chronic disease during today's visit Hypertension (HTN), Other  [Rectal Cancer 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,  s/p pneumonia]  General Interventions   General Interventions Discussed/Reviewed General Interventions Discussed, General Interventions Reviewed, Doctor Visits, Labs, Durable Medical Equipment (DME)  Doctor Visits Discussed/Reviewed Doctor Visits Discussed, Doctor Visits Reviewed, Specialist, PCP  Durable Medical Equipment (DME) Other, BP Cuff  [Incentive spirometer]  Exercise Interventions   Exercise Discussed/Reviewed Physical Activity, Exercise Reviewed, Exercise Discussed  Physical Activity Discussed/Reviewed Types of exercise, Physical Activity Reviewed, Physical Activity Discussed, Home Exercise Program (HEP)  Education Interventions   Education Provided Provided Education, Provided Printed Education  Provided Verbal Education On When to see the doctor, Nutrition, Medication, Exercise  Nutrition Interventions   Nutrition Discussed/Reviewed Nutrition Discussed, Nutrition Reviewed  Pharmacy Interventions   Pharmacy Dicussed/Reviewed Pharmacy Topics Discussed, Pharmacy Topics Reviewed, Medications and their functions, Medication Adherence          SDOH assessments and interventions  completed:  No     Care Coordination Interventions:  Yes, provided  Follow up plan: Follow up call scheduled for 01/18/24 @1 :30 PM    Encounter Outcome:  Patient Visit Completed

## 2023-12-27 ENCOUNTER — Other Ambulatory Visit: Payer: Self-pay | Admitting: Nurse Practitioner

## 2023-12-27 ENCOUNTER — Ambulatory Visit: Payer: Medicare Other | Admitting: Hematology

## 2023-12-27 ENCOUNTER — Other Ambulatory Visit: Payer: Medicare Other

## 2023-12-27 ENCOUNTER — Ambulatory Visit: Payer: Medicare Other

## 2023-12-27 DIAGNOSIS — C20 Malignant neoplasm of rectum: Secondary | ICD-10-CM

## 2023-12-27 DIAGNOSIS — Z515 Encounter for palliative care: Secondary | ICD-10-CM

## 2023-12-27 DIAGNOSIS — G893 Neoplasm related pain (acute) (chronic): Secondary | ICD-10-CM

## 2023-12-27 MED ORDER — OXYCODONE HCL 5 MG PO TABS: 5.0000 mg | ORAL_TABLET | Freq: Four times a day (QID) | ORAL | 0 refills | Status: DC | PRN

## 2023-12-31 ENCOUNTER — Other Ambulatory Visit: Payer: Medicare Other

## 2023-12-31 ENCOUNTER — Ambulatory Visit: Payer: Medicare Other | Admitting: Hematology

## 2023-12-31 ENCOUNTER — Ambulatory Visit: Payer: Medicare Other

## 2024-01-03 ENCOUNTER — Other Ambulatory Visit: Payer: Self-pay | Admitting: Hematology

## 2024-01-03 ENCOUNTER — Inpatient Hospital Stay: Payer: Medicare Other

## 2024-01-03 ENCOUNTER — Inpatient Hospital Stay (HOSPITAL_BASED_OUTPATIENT_CLINIC_OR_DEPARTMENT_OTHER): Payer: Medicare Other | Admitting: Hematology

## 2024-01-03 ENCOUNTER — Encounter: Payer: Self-pay | Admitting: Hematology

## 2024-01-03 VITALS — BP 139/61 | HR 60 | Temp 97.8°F | Resp 16 | Wt 146.9 lb

## 2024-01-03 DIAGNOSIS — Z5112 Encounter for antineoplastic immunotherapy: Secondary | ICD-10-CM | POA: Diagnosis not present

## 2024-01-03 DIAGNOSIS — C20 Malignant neoplasm of rectum: Secondary | ICD-10-CM

## 2024-01-03 DIAGNOSIS — Z95828 Presence of other vascular implants and grafts: Secondary | ICD-10-CM

## 2024-01-03 LAB — CMP (CANCER CENTER ONLY)
ALT: 12 U/L (ref 0–44)
AST: 19 U/L (ref 15–41)
Albumin: 3.3 g/dL — ABNORMAL LOW (ref 3.5–5.0)
Alkaline Phosphatase: 95 U/L (ref 38–126)
Anion gap: 2 — ABNORMAL LOW (ref 5–15)
BUN: 10 mg/dL (ref 8–23)
CO2: 32 mmol/L (ref 22–32)
Calcium: 8.6 mg/dL — ABNORMAL LOW (ref 8.9–10.3)
Chloride: 103 mmol/L (ref 98–111)
Creatinine: 0.81 mg/dL (ref 0.61–1.24)
GFR, Estimated: >60 mL/min (ref >60)
Glucose, Bld: 101 mg/dL — ABNORMAL HIGH (ref 70–99)
Potassium: 4.5 mmol/L (ref 3.5–5.1)
Sodium: 137 mmol/L (ref 135–145)
Total Bilirubin: 0.9 mg/dL (ref 0.0–1.2)
Total Protein: 6.9 g/dL (ref 6.5–8.1)

## 2024-01-03 LAB — CBC WITH DIFFERENTIAL (CANCER CENTER ONLY)
Abs Immature Granulocytes: 0.01 10*3/uL (ref 0.00–0.07)
Basophils Absolute: 0 10*3/uL (ref 0.0–0.1)
Basophils Relative: 0 %
Eosinophils Absolute: 0.1 10*3/uL (ref 0.0–0.5)
Eosinophils Relative: 2 %
HCT: 39.4 % (ref 39.0–52.0)
Hemoglobin: 13.1 g/dL (ref 13.0–17.0)
Immature Granulocytes: 0 %
Lymphocytes Relative: 38 %
Lymphs Abs: 1.1 10*3/uL (ref 0.7–4.0)
MCH: 30.6 pg (ref 26.0–34.0)
MCHC: 33.2 g/dL (ref 30.0–36.0)
MCV: 92.1 fL (ref 80.0–100.0)
Monocytes Absolute: 0.4 10*3/uL (ref 0.1–1.0)
Monocytes Relative: 14 %
Neutro Abs: 1.3 10*3/uL — ABNORMAL LOW (ref 1.7–7.7)
Neutrophils Relative %: 46 %
Platelet Count: 147 10*3/uL — ABNORMAL LOW (ref 150–400)
RBC: 4.28 MIL/uL (ref 4.22–5.81)
RDW: 20 % — ABNORMAL HIGH (ref 11.5–15.5)
WBC Count: 2.8 10*3/uL — ABNORMAL LOW (ref 4.0–10.5)
nRBC: 0 % (ref 0.0–0.2)

## 2024-01-03 LAB — TOTAL PROTEIN, URINE DIPSTICK: Protein, ur: 300 mg/dL — AB

## 2024-01-03 MED ORDER — SODIUM CHLORIDE 0.9% FLUSH
10.0000 mL | Freq: Once | INTRAVENOUS | Status: AC
Start: 2024-01-03 — End: 2024-01-03
  Administered 2024-01-03: 10 mL

## 2024-01-03 MED ORDER — HEPARIN SOD (PORK) LOCK FLUSH 100 UNIT/ML IV SOLN
500.0000 [IU] | Freq: Once | INTRAVENOUS | Status: AC
Start: 2024-01-03 — End: 2024-01-03
  Administered 2024-01-03: 500 [IU]

## 2024-01-03 MED ORDER — SODIUM CHLORIDE 0.9% FLUSH
10.0000 mL | Freq: Once | INTRAVENOUS | Status: AC
  Administered 2024-01-03: 10 mL

## 2024-01-03 NOTE — Assessment & Plan Note (Addendum)
 [id number] with lung metastasis, MMR normal, KRAS(+) [id number]  -diagnosed 08/2021 by colonoscopy for rectal bleeding and frequent BM. Staging MRI showed early extra mesorectal lymph node involvement.  -baseline CEA 571.84 on 09/06/21.  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 Xeloda dose reduced due to elevated liver enzymes.  -due to new liver lesions, we switched to second-line FOLFIRI, with continued beva, on 02/21/22. He tolerates well overall.  - restaging CT scan on April 22 and June 26 showed stable disease overall. -CT 10/09/2023 showed mild disease progression in lungs, his tumor marker CEA has been going up also.  -Plan to change his chemo to lonsurf and beva, he started on 12/13/2023

## 2024-01-03 NOTE — Progress Notes (Signed)
 Per Mosetta Putt, MD, treatment plan on hold today dt urine protein 300. Pt deaccessed and given copy of labs. Verbalized understanding. Aware of next appointments. Pt ambulated independently to lobby for d/c.

## 2024-01-03 NOTE — Progress Notes (Signed)
 [facility]   [phone] [phone]   Clinic Follow up Note   Patient Care Team: Ellender Hose, NP as PCP - General (Family Medicine) Croitoru, Rachelle Hora, MD as PCP - Cardiology (Cardiology) Pickenpack-Cousar, Arty Baumgartner, NP as Nurse Practitioner (Nurse Practitioner) Malachy Mood, MD as Consulting Physician (Hematology) Bridgett Larsson, LCSW as VBCI Care Management (Licensed Clinical Social Worker)  Date of Service:  01/03/2024  CHIEF COMPLAINT: f/u of metastatic rectal cancer  CURRENT THERAPY:  Lonsurf and bevacizumab  Oncology History   Rectal cancer (HCC) [phone] with lung metastasis, MMR normal, KRAS(+) [id number]  -diagnosed 08/2021 by colonoscopy for rectal bleeding and frequent BM. Staging MRI showed early extra mesorectal lymph node involvement.  -baseline CEA 571.84 on 09/06/21.  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 Xeloda dose reduced due to elevated liver enzymes.  -due to new liver lesions, we switched to second-line FOLFIRI, with continued beva, on 02/21/22. He tolerates well overall.  - restaging CT scan on April 22 and June 26 showed stable disease overall. -CT 10/09/2023 showed mild disease progression in lungs, his tumor marker CEA has been going up also.  -Plan to change his chemo to lonsurf and beva, he started on 12/13/2023    Assessment and Plan    Metastatic Colon Cancer Follow-up for metastatic colon cancer with rectal involvement. Recent CT scan (Dec 20, 2023) shows worsening rectal cancer, increased liver lesions, and lung nodules of uncertain etiology. Currently on Lonsurf and bevacizumab with no significant side effects. Undergoing home physical therapy and functioning well. Next Lonsurf cycle starts January 14, 2024. Oral chemotherapy aims to control rectal, liver, and lung lesions.  Today's infusion will be approximately 30 minutes plus chair time. - Administer bevacizumab infusion today - Continue Lonsurf, next cycle to start on January 14, 2024 - Schedule next infusion for January 17, 2024 - Switch infusion day to Tuesday for the next cycle - Monitor liver lesions and lung nodules on follow-up imaging  Peripheral Neuropathy Peripheral neuropathy managed with gabapentin. Reports slight improvement in symptoms. - Continue gabapentin - Monitor symptoms and adjust treatment as necessary  General Health Maintenance Engaged in physical therapy to maintain muscle strength and overall physical health. Physical therapy is beneficial for muscle building and overall activity. - Schedule physical therapy sessions - Encourage continued physical activity and exercise  Plan -Lab reviewed, urine protein 300, will hold bevacizumab today - Schedule follow-up and bevacizumab infusion appointment in two weeks.  -He will start cycle 2 Lonsurf on January 14, 2024. -I encouraged him to continue physical therapy at home.      SUMMARY OF ONCOLOGIC HISTORY: Oncology History Overview Note  Cancer Staging Rectal cancer [facility]) Staging form: Colon and Rectum, AJCC 8th Edition - Clinical stage from 08/29/2021: Jorge [name], cM1 - Signed by Malachy Mood, MD on 08/29/2021    Rectal cancer (HCC)  07/23/2021 Imaging   CT AP  IMPRESSION: 1. Appearance of the rectum likely indicates a rectal mass suspicious for rectal carcinoma. Inflammatory process would be less likely. Direct visualization is suggested. 2. Cholelithiasis without evidence of acute cholecystitis. 3. Aortic atherosclerosis.   08/19/2021 Procedure   Colonoscopy, under Dr. Meridee Score  Impression: - Rectal tenderness, palpable rectal mass and hemorrhoids found on digital rectal exam. - Stool in the entire examined colon - lavaged with still inadequate clearance. - One 35 mm polyp at the hepatic flexure.  Biopsied. Tattooed distal in  case future endoscopic resection is considered - however, has larger issues with rectal mass currently as below. - Four, 5 to 11 mm polyps in the transverse colon and at the hepatic flexure, removed with a cold snare. Resected and retrieved. - Diverticulosis in the recto-sigmoid colon and in the sigmoid colon. - Rule out malignancy, partially obstructing tumor in the rectum and from 6 to 13 cm proximal to the anus. Biopsied.   08/19/2021 Pathology Results   Diagnosis 1. Hepatic Flexure Biopsy - TUBULAR ADENOMA WITHOUT HIGH-GRADE DYSPLASIA OR MALIGNANCY 2. Transverse Colon Biopsy, and hepatic flexure, polyps (4) - TUBULAR ADENOMA WITHOUT HIGH-GRADE DYSPLASIA OR MALIGNANCY - OTHER FRAGMENTS OF POLYPOID COLONIC MUCOSA WITH NO SPECIFIC HISTOPATHOLOGIC CHANGES - FOOD MATERIAL 3. Rectum, biopsy - ADENOCARCINOMA. SEE NOTE   08/22/2021 Initial Diagnosis   Rectal cancer (HCC)   08/26/2021 Imaging   IMPRESSION: 1. A 2.2 x 1.9 cm right middle lobe pulmonary nodule as well as a total of four right upper lobe pulmonary nodule and masses measuring up to 3.6 cm. Findings concerning for metastatic primary lung cancer versus less likely metastases in a patient with rectal cancer. Additional imaging evaluation or consultation with Pulmonology or Thoracic Surgery recommended. 2. No gross hilar adenopathy, noting limited sensitivity for the detection of hilar adenopathy on this noncontrast study. 3. Cholelithiasis. 4.  Emphysema (ICD10-J43.9). 5. At least left anterior descending coronary artery calcifications.   08/27/2021 Imaging   IMPRESSION: Rectal adenocarcinoma T stage: T4 B   Rectal adenocarcinoma N stage: N2 disease likely associated with early extra mesorectal lymph node involvement.   Distance from tumor to the internal anal sphincter is 1.2 cm.   Also with extramural venous involvement as described.   08/29/2021 Cancer Staging   Staging form: Colon and Rectum, AJCC 8th Edition -  Clinical stage from 08/29/2021: Jorge [name], cM1 - Signed by Malachy Mood, MD on 08/29/2021 Stage prefix: Initial diagnosis   01/23/2022 Imaging   CT CAP w contrast IMPRESSION: 1. Mild interval decrease in size of multiple right-sided pulmonary nodules. No new suspicious pulmonary nodule or mass. 2. Interval development of approximately 10 new ill-defined hypoattenuating liver lesions ranging in size from approximately 5-10 mm. Imaging features suspicious for metastatic disease. MRI abdomen with and without contrast may prove helpful to further evaluate. 3. Similar appearance of wall thickening in the rectum with perirectal edema. 4. Cholelithiasis. 5. Prostatomegaly. 6. Aortic Atherosclerosis (ICD10-I70.0).   11/21/2022 Imaging    IMPRESSION: 1. Stable exam. No new or progressive interval findings. 2. The primary rectosigmoid lesion described previously is not well seen on the current study. There is presacral and perirectal edema, likely treatment related. 3. No substantial change in appearance of bilateral pulmonary metastases. 4. Stable tiny hypodensities in both hepatic lobes. These were previously characterized as metastatic disease. No new liver lesions on today's exam. 5. Cholelithiasis. 6. 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  History of Present Illness   Jorge [name], a patient with metastatic colon cancer, presents for a follow-up visit. He is currently on a regimen of oral chemotherapy (Lonsurf), which he started a few weeks ago. He completed two weeks of treatment and is due to start the next cycle on March 10th. He reports no adverse effects from the medication, such as diarrhea, nausea, or fatigue. He also mentions that he is receiving physical therapy at home, which includes walking and other exercises. He finds the therapy challenging but beneficial.  In addition to his cancer treatment, Zahir is also managing neuropathy with gabapentin. He reports that his symptoms have improved slightly. He also mentions a previous bout of pneumonia from which he has fully recovered.         All other systems were reviewed with the patient and are negative.  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    SURGICAL HISTORY: Past Surgical History:  Procedure Laterality Date   BRONCHIAL BIOPSY  09/27/2021   Procedure: BRONCHIAL BIOPSIES;  Surgeon: Josephine Igo, DO;  Location: [facility];  Service: Pulmonary;;   BRONCHIAL BRUSHINGS  09/27/2021   Procedure: BRONCHIAL BRUSHINGS;  Surgeon: Josephine Igo, DO;  Location: [facility];  Service: Pulmonary;;   BRONCHIAL NEEDLE ASPIRATION BIOPSY  09/27/2021   Procedure: BRONCHIAL NEEDLE ASPIRATION BIOPSIES;  Surgeon: Josephine Igo, DO;  Location: [facility];  Service: Pulmonary;;   COLONOSCOPY     IR IMAGING GUIDED PORT INSERTION  10/24/2021   left breast surgery Left 1968   VIDEO BRONCHOSCOPY WITH RADIAL ENDOBRONCHIAL ULTRASOUND  09/27/2021   Procedure: RADIAL ENDOBRONCHIAL ULTRASOUND;  Surgeon: Josephine Igo, DO;  Location: [facility];  Service: Pulmonary;;    I have reviewed  the social history and family history with the patient and they are unchanged from previous note.  ALLERGIES:  has no known allergies.  MEDICATIONS:  Current Outpatient Medications  Medication Sig Dispense Refill   amLODipine (NORVASC) 10 MG tablet Take 1 tablet (10 mg total) by mouth daily. 90 tablet 1   diphenoxylate-atropine (LOMOTIL) 2.5-0.025 MG tablet Take 1-2 tablets by mouth 4 (four) times daily as needed for diarrhea or loose stools. 90 tablet 1   gabapentin (NEURONTIN) 100 MG capsule Take 2 capsules (200 mg total) by mouth 3 (three) times daily. 180 capsule 2   metoprolol tartrate (LOPRESSOR) 25 MG tablet Take 1 tablet (25 mg total) by mouth 2 (two) times daily. 60 tablet 0   Multiple Vitamin (MULTIVITAMIN WITH MINERALS) TABS tablet Take 1 tablet by mouth daily. (Patient not taking: Reported on 11/24/2023)     ondansetron (ZOFRAN) 8 MG tablet Take 1 tablet (8 mg total) by mouth every 8 (eight) hours as needed for nausea or vomiting. (Patient not taking: Reported on 11/24/2023) 30 tablet 3   oxyCODONE (OXY IR/ROXICODONE) 5 MG immediate release tablet Take 1 tablet (5 mg total) by mouth every 6 (six) hours as needed for severe pain (pain score 7-10) or breakthrough pain. 60 tablet 0   pantoprazole (PROTONIX) 40 MG tablet Take 1 tablet (40 mg total) by mouth daily before breakfast. 90 tablet 0   prochlorperazine (COMPAZINE) 10 MG tablet Take 1 tablet (10 mg total) by  mouth every 6 (six) hours as needed (Nausea or vomiting). 30 tablet 1   Pseudoephedrine-APAP-DM (DAYQUIL PO) Take 1 tablet by mouth as needed.     trifluridine-tipiracil (LONSURF) 20-8.19 MG tablet Take 3 tablets (60 mg of trifluridine total) by mouth 2 (two) times daily after a meal. Take within 1 hr after AM & PM meals on days 1-5, 8-12. Repeat every 28 days. (Patient not taking: Reported on 11/24/2023) 60 tablet 0   No current facility-administered medications for this visit.    PHYSICAL EXAMINATION: ECOG PERFORMANCE  STATUS: 1 - Symptomatic but completely ambulatory  Vitals:   01/03/24 1021  BP: 139/61  Pulse: 60  Resp: 16  Temp: 97.8 F (36.6 C)  SpO2: 98%   Wt Readings from Last 3 Encounters:  01/03/24 146 lb 14.4 oz (66.6 kg)  12/13/23 145 lb 14.4 oz (66.2 kg)  12/05/23 152 lb (68.9 kg)     GENERAL:alert, no distress and comfortable SKIN: skin color, texture, turgor are normal, no rashes or significant lesions EYES: normal, Conjunctiva are pink and non-injected, sclera clear NECK: supple, thyroid normal size, non-tender, without nodularity LYMPH:  no palpable lymphadenopathy in the cervical, axillary  LUNGS: clear to auscultation and percussion with normal breathing effort HEART: regular rate & rhythm and no murmurs and no lower extremity edema ABDOMEN:abdomen soft, non-tender and normal bowel sounds Musculoskeletal:no cyanosis of digits and no clubbing  NEURO: alert & oriented x 3 with fluent speech, no focal motor/sensory deficits    LABORATORY DATA:  I have reviewed the data as listed    Latest Ref Rng & Units 01/03/2024    9:56 AM 12/13/2023    9:27 AM 11/27/2023    5:46 AM  CBC  WBC 4.0 - 10.5 K/uL 2.8  4.2  4.5   Hemoglobin 13.0 - 17.0 g/dL 16.1  09.6  04.5   Hematocrit 39.0 - 52.0 % 39.4  37.3  35.6   Platelets 150 - 400 K/uL 147  151  301         Latest Ref Rng & Units 12/13/2023    9:27 AM 11/27/2023    5:46 AM 11/26/2023    6:20 AM  CMP  Glucose 70 - 99 mg/dL 87  409  97   BUN 8 - 23 mg/dL 11  10  10    Creatinine 0.61 - 1.24 mg/dL 8.11  9.14  7.82   Sodium 135 - 145 mmol/L 137  135  133   Potassium 3.5 - 5.1 mmol/L 4.1  3.9  3.3   Chloride 98 - 111 mmol/L 104  105  99   CO2 22 - 32 mmol/L 32  26  27   Calcium 8.9 - 10.3 mg/dL 8.8  8.4  7.9   Total Protein 6.5 - 8.1 g/dL 7.0  6.2  6.0   Total Bilirubin 0.0 - 1.2 mg/dL 0.5  0.4  0.8   Alkaline Phos 38 - 126 U/L 121  124  96   AST 15 - 41 U/L 25  26  26    ALT 0 - 44 U/L 16  21  23        RADIOGRAPHIC  STUDIES: I have personally reviewed the radiological images as listed and agreed with the findings in the report. No results found.    No orders of the defined types were placed in this encounter.  All questions were answered. The patient knows to call the clinic with any problems, questions or concerns. No barriers to learning was detected.  The total time spent in the appointment was 25 minutes.     Malachy Mood, MD 01/03/2024

## 2024-01-04 ENCOUNTER — Other Ambulatory Visit: Payer: Self-pay | Admitting: Nurse Practitioner

## 2024-01-04 DIAGNOSIS — C7800 Secondary malignant neoplasm of unspecified lung: Secondary | ICD-10-CM

## 2024-01-08 ENCOUNTER — Other Ambulatory Visit: Payer: Self-pay | Admitting: Nurse Practitioner

## 2024-01-08 ENCOUNTER — Other Ambulatory Visit (HOSPITAL_COMMUNITY): Payer: Self-pay

## 2024-01-08 DIAGNOSIS — C7800 Secondary malignant neoplasm of unspecified lung: Secondary | ICD-10-CM

## 2024-01-08 DIAGNOSIS — G893 Neoplasm related pain (acute) (chronic): Secondary | ICD-10-CM

## 2024-01-08 DIAGNOSIS — Z515 Encounter for palliative care: Secondary | ICD-10-CM

## 2024-01-08 MED ORDER — OXYCODONE HCL 5 MG PO TABS: 5.0000 mg | ORAL_TABLET | Freq: Four times a day (QID) | ORAL | 0 refills | Status: DC | PRN

## 2024-01-09 ENCOUNTER — Encounter: Payer: Self-pay | Admitting: Hematology

## 2024-01-11 ENCOUNTER — Ambulatory Visit: Payer: Self-pay | Admitting: Licensed Clinical Social Worker

## 2024-01-14 NOTE — Patient Instructions (Signed)
 Visit Information  Thank you for taking time to visit with me today. Please don't hesitate to contact me if I can be of assistance to you.   Following are the goals we discussed today:   Goals Addressed             This Visit's Progress    Obtain Supportive Resources-LCSW   On track    Activities and task to complete in order to accomplish goals.   Keep all upcoming appointments discussed today Continue with compliance of taking medication prescribed by Doctor Implement healthy coping skills discussed to assist with management of symptoms         Our next appointment is by telephone on 4/11 at 10 AM  Please call the care guide team at [phone] if you need to cancel or reschedule your appointment.   If you are experiencing a Mental Health or Behavioral Health Crisis or need someone to talk to, please call the Suicide and Crisis Lifeline: 988 call 911   Patient verbalizes understanding of instructions and care plan provided today and agrees to view in MyChart. Active MyChart status and patient understanding of how to access instructions and care plan via MyChart confirmed with patient.     [facility]  [facility], [facility] Clinical Social Worker Direct Dial: [phone]  Fax: [phone] Website: [email or web address] 5:26 PM

## 2024-01-14 NOTE — Patient Outreach (Signed)
 Care Coordination   Follow Up Visit Note   01/11/2024 Name: ASTER ECKRICH MRN: [id number] DOB: 03-27-1951  JAREK LONGTON is a 73 y.o. year old male who sees Ellender Hose, NP for primary care. I spoke with  Nolon Bussing Chambers's daughter by phone today.  What matters to the patients health and wellness today?  Symptom Management    Goals Addressed             This Visit's Progress    Obtain Supportive Resources-LCSW   On track    Activities and task to complete in order to accomplish goals.   Keep all upcoming appointments discussed today Continue with compliance of taking medication prescribed by Doctor Implement healthy coping skills discussed to assist with management of symptoms         SDOH assessments and interventions completed:  No     Care Coordination Interventions:  Yes, provided  Interventions Today    Flowsheet Row Most Recent Value  Chronic Disease   Chronic disease during today's visit Hypertension (HTN), Other  [Cancer]  General Interventions   General Interventions Discussed/Reviewed General Interventions Reviewed, Doctor Visits, Level of Care  Doctor Visits Discussed/Reviewed Doctor Visits Reviewed  Level of Care --  [Pt continues to participate in [facility] services]  Mental Health Interventions   Mental Health Discussed/Reviewed Mental Health Reviewed  [Caregiver stress assessed and strategies discussed to promote positive mood and socialization. Discussed programs through the [facility]. Daughter has FMLA to assist with taking him to appts]  Nutrition Interventions   Nutrition Discussed/Reviewed Nutrition Reviewed  Pharmacy Interventions   Pharmacy Dicussed/Reviewed Pharmacy Topics Reviewed, Medication Adherence  Safety Interventions   Safety Discussed/Reviewed Safety Reviewed       Follow up plan: Follow up call scheduled for 4-6 weeks    Encounter Outcome:  Patient Visit Completed   Jenel Lucks, LCSW   [facility], [facility] Clinical Social Worker Direct Dial: [phone]  Fax: [phone] Website: [email or web address] 5:25 PM

## 2024-01-16 ENCOUNTER — Telehealth: Payer: Self-pay | Admitting: Licensed Clinical Social Worker

## 2024-01-16 NOTE — Patient Outreach (Signed)
 Care Coordination   Follow Up Visit Note   01/14/2024 Name: QUENTION MCNEILL MRN: [id number] DOB: Aug 30, 1951  MEDARD DECUIR is a 73 y.o. year old male who sees Ellender Hose, NP for primary care. I  emailed pt's daughter  What matters to the patients health and wellness today?  Supportive Resources    Goals Addressed             This Visit's Progress    Obtain Supportive Resources-LCSW   On track    Activities and task to complete in order to accomplish goals.   Keep all upcoming appointments discussed today Continue with compliance of taking medication prescribed by Doctor Implement healthy coping skills discussed to assist with management of symptoms Review supportive resources provided via email         SDOH assessments and interventions completed:  No     Care Coordination Interventions:  Yes, provided  Interventions Today    Flowsheet Row Most Recent Value  General Interventions   General Interventions Discussed/Reviewed Community Resources  [LCSW emailed pt's daughter information on [facility] Health Support Groups for those receiving various treatment]       Follow up plan: Follow up call scheduled for 2-4 weeks    Encounter Outcome:  Patient Visit Completed   Jenel Lucks, LCSW [facility], [facility] Clinical Social Worker Direct Dial: [phone]  Fax: [phone] Website: [email or web address] 10:46 AM

## 2024-01-17 ENCOUNTER — Inpatient Hospital Stay: Payer: Medicare Other | Attending: Physician Assistant

## 2024-01-17 ENCOUNTER — Other Ambulatory Visit: Payer: Self-pay | Admitting: Nurse Practitioner

## 2024-01-17 ENCOUNTER — Inpatient Hospital Stay: Payer: Medicare Other

## 2024-01-17 ENCOUNTER — Inpatient Hospital Stay (HOSPITAL_BASED_OUTPATIENT_CLINIC_OR_DEPARTMENT_OTHER): Payer: Medicare Other | Admitting: Nurse Practitioner

## 2024-01-17 VITALS — BP 138/70 | HR 77 | Temp 97.8°F | Resp 17 | Wt 146.7 lb

## 2024-01-17 DIAGNOSIS — C20 Malignant neoplasm of rectum: Secondary | ICD-10-CM | POA: Insufficient documentation

## 2024-01-17 DIAGNOSIS — C7801 Secondary malignant neoplasm of right lung: Secondary | ICD-10-CM | POA: Diagnosis not present

## 2024-01-17 DIAGNOSIS — Z5112 Encounter for antineoplastic immunotherapy: Secondary | ICD-10-CM | POA: Insufficient documentation

## 2024-01-17 DIAGNOSIS — C7800 Secondary malignant neoplasm of unspecified lung: Secondary | ICD-10-CM | POA: Diagnosis not present

## 2024-01-17 DIAGNOSIS — Z79899 Other long term (current) drug therapy: Secondary | ICD-10-CM | POA: Diagnosis not present

## 2024-01-17 DIAGNOSIS — R197 Diarrhea, unspecified: Secondary | ICD-10-CM

## 2024-01-17 DIAGNOSIS — Z95828 Presence of other vascular implants and grafts: Secondary | ICD-10-CM

## 2024-01-17 DIAGNOSIS — Z515 Encounter for palliative care: Secondary | ICD-10-CM

## 2024-01-17 LAB — CMP (CANCER CENTER ONLY)
ALT: 12 U/L (ref 0–44)
AST: 21 U/L (ref 15–41)
Albumin: 3.5 g/dL (ref 3.5–5.0)
Alkaline Phosphatase: 91 U/L (ref 38–126)
Anion gap: 3 — ABNORMAL LOW (ref 5–15)
BUN: 14 mg/dL (ref 8–23)
CO2: 31 mmol/L (ref 22–32)
Calcium: 8.5 mg/dL — ABNORMAL LOW (ref 8.9–10.3)
Chloride: 102 mmol/L (ref 98–111)
Creatinine: 0.86 mg/dL (ref 0.61–1.24)
GFR, Estimated: >60 mL/min (ref >60)
Glucose, Bld: 115 mg/dL — ABNORMAL HIGH (ref 70–99)
Potassium: 3.9 mmol/L (ref 3.5–5.1)
Sodium: 136 mmol/L (ref 135–145)
Total Bilirubin: 0.6 mg/dL (ref 0.0–1.2)
Total Protein: 7 g/dL (ref 6.5–8.1)

## 2024-01-17 LAB — CBC WITH DIFFERENTIAL (CANCER CENTER ONLY)
Abs Immature Granulocytes: 0.01 10*3/uL (ref 0.00–0.07)
Basophils Absolute: 0 10*3/uL (ref 0.0–0.1)
Basophils Relative: 1 %
Eosinophils Absolute: 0 10*3/uL (ref 0.0–0.5)
Eosinophils Relative: 1 %
HCT: 40.6 % (ref 39.0–52.0)
Hemoglobin: 13.3 g/dL (ref 13.0–17.0)
Immature Granulocytes: 0 %
Lymphocytes Relative: 41 %
Lymphs Abs: 1.2 10*3/uL (ref 0.7–4.0)
MCH: 30.2 pg (ref 26.0–34.0)
MCHC: 32.8 g/dL (ref 30.0–36.0)
MCV: 92.1 fL (ref 80.0–100.0)
Monocytes Absolute: 0.3 10*3/uL (ref 0.1–1.0)
Monocytes Relative: 11 %
Neutro Abs: 1.4 10*3/uL — ABNORMAL LOW (ref 1.7–7.7)
Neutrophils Relative %: 46 %
Platelet Count: 174 10*3/uL (ref 150–400)
RBC: 4.41 MIL/uL (ref 4.22–5.81)
RDW: 17.7 % — ABNORMAL HIGH (ref 11.5–15.5)
WBC Count: 2.9 10*3/uL — ABNORMAL LOW (ref 4.0–10.5)
nRBC: 0.7 % — ABNORMAL HIGH (ref 0.0–0.2)

## 2024-01-17 LAB — CEA (ACCESS): CEA (CHCC): 206.34 ng/mL — ABNORMAL HIGH (ref 0.00–5.00)

## 2024-01-17 MED ORDER — SODIUM CHLORIDE 0.9% FLUSH
10.0000 mL | Freq: Once | INTRAVENOUS | Status: AC
  Administered 2024-01-17: 10 mL

## 2024-01-17 MED ORDER — DIPHENOXYLATE-ATROPINE 2.5-0.025 MG PO TABS: 1.0000 | ORAL_TABLET | Freq: Four times a day (QID) | ORAL | 1 refills | Status: DC | PRN

## 2024-01-17 MED ORDER — BEVACIZUMAB-ADCD CHEMO INJECTION 400 MG/16ML
5.0000 mg/kg | Freq: Once | INTRAVENOUS | Status: AC
  Administered 2024-01-17: 325 mg via INTRAVENOUS
  Filled 2024-01-17: qty 13

## 2024-01-17 MED ORDER — SODIUM CHLORIDE 0.9% FLUSH
10.0000 mL | INTRAVENOUS | Status: DC | PRN
  Administered 2024-01-17: 10 mL

## 2024-01-17 MED ORDER — HEPARIN SOD (PORK) LOCK FLUSH 100 UNIT/ML IV SOLN
500.0000 [IU] | Freq: Once | INTRAVENOUS | Status: AC | PRN
  Administered 2024-01-17: 500 [IU]

## 2024-01-17 MED ORDER — SODIUM CHLORIDE 0.9 % IV SOLN: INTRAVENOUS | Status: DC

## 2024-01-17 NOTE — Progress Notes (Signed)
 Per Dr. Mosetta Putt, ok to treat w/o urine protein level today since the last treatment was held d/t elevated urine protein (300). Placed an order for a urine protein level with next treatment day per Dr. Latanya Maudlin orders.  Drusilla Kanner, PharmD, MBA

## 2024-01-17 NOTE — Progress Notes (Signed)
 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 Staging MRI showed early extra mesorectal lymph node involvement.  -baseline CEA 571.84 on 09/06/21.  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 Xeloda dose reduced due to elevated liver enzymes.  -due to new liver lesions, we switched to second-line FOLFIRI, with continued beva, on 02/21/22. He tolerates well overall.  - restaging CT scan on April 22 and June 26 showed stable disease overall. -CT 10/09/2023 showed mild disease progression in lungs, his tumor marker CEA has been going up also.  -Plan to change his chemo to lonsurf and beva, he started on 12/13/2023 -Patient has tolerated new treatment well.  Continue as scheduled.   Plan:  Labs reviewed. Results are unremarkable. CEA pending. Continue with Lonsurf 60 mg twice daily on days 1 through 5 and 8 through 12 at each 28-day cycle. Proceed with bevacizumab today. Labs/flush, follow-up, and treatment in 2 weeks as scheduled.   The patient understands the plans discussed today and is in agreement with them.  He knows to contact our office if he develops concerns prior to his next appointment.  I provided 25 minutes of face-to-face time during this encounter and > 50% was spent counseling as documented under my  assessment and plan.    Carlean Jews, NP  [facility] MED ONC - A DEPT OF [facility] [address] [place] [address] Dept: [phone] Dept Fax: [phone]   No orders of the defined types were placed in this encounter.     CHIEF COMPLAINT:  CC: Rectal cancer  Current Treatment: Lonsurf and bevacizumab  INTERVAL HISTORY:  Jorge [name] is here today for repeat clinical assessment. Today, he presents for Cycle 2 day 1.  Reports feeling well since change in therapy.  Reports his appetite is decent.  Has physical therapy coming to his home now to help improve muscle strength and mass. He reports improved energy.  He does still have frequent diarrhea.  States needs new prescription for Lomotil. He denies chest pain, chest pressure, or shortness of breath. He denies headaches or visual disturbances. He denies abdominal pain, nausea, vomiting, or changes in bowel or bladder habits.  He denies fevers or chills. He denies pain. His appetite is good. His weight has been stable.  I have reviewed the past medical history, past surgical history, social history and family history with the patient and they are unchanged from previous note.  ALLERGIES:  has no known allergies.  MEDICATIONS:  Current Outpatient Medications  Medication Sig Dispense Refill   amLODipine (NORVASC) 10 MG tablet Take 1 tablet (10 mg total) by mouth daily. 90 tablet 1   gabapentin (NEURONTIN) 100 MG capsule Take 2 capsules (200 mg total) by mouth 3 (three) times daily. 180 capsule 2   LONSURF 20-8.19 MG tablet TAKE THREE 20MG  TABLETS FOR A TOTAL DOSE OF (60MG )  BY MOUTH TWICE DAILY ON DAYS 1-5 AND DAYS 8-12 OF EACH 28 DAY CYCLE. 60 tablet 8   metoprolol tartrate (LOPRESSOR) 25 MG tablet Take 1 tablet (25 mg total) by mouth 2 (two) times daily. 60 tablet 0   Multiple Vitamin (MULTIVITAMIN WITH MINERALS) TABS tablet Take 1 tablet by mouth daily.     ondansetron  (ZOFRAN) 8 MG tablet Take 1 tablet (8 mg total) by mouth every 8 (eight) hours as needed for nausea or vomiting. 30 tablet 3   pantoprazole (PROTONIX) 40 MG tablet Take 1 tablet (40 mg total) by mouth daily before breakfast. 90 tablet 0   prochlorperazine (COMPAZINE) 10 MG tablet Take 1 tablet (10 mg total) by mouth every 6 (six) hours as needed (Nausea or vomiting). 30 tablet 1   Pseudoephedrine-APAP-DM (DAYQUIL PO) Take 1 tablet by mouth as needed.     diphenoxylate-atropine (LOMOTIL) 2.5-0.025 MG tablet Take 1-2 tablets by mouth 4 (four) times daily as needed for diarrhea or loose stools. 90 tablet 1   [START ON 01/22/2024] oxyCODONE (OXY IR/ROXICODONE) 5 MG immediate release tablet Take 1 tablet (5 mg total) by mouth every 6 (six) hours as needed for severe pain (pain score 7-10) or breakthrough pain. 60 tablet 0   No current facility-administered medications for this visit.    HISTORY OF PRESENT ILLNESS:   Oncology History Overview Note  Cancer Staging Rectal cancer [facility]) Staging form: Colon and Rectum, AJCC 8th Edition - Clinical stage from 08/29/2021: Jorge [name], cM1 - Signed by Malachy Mood, MD on 08/29/2021    Rectal cancer (HCC)  07/23/2021 Imaging   CT AP  IMPRESSION: 1. Appearance of the rectum likely indicates a rectal mass suspicious for rectal carcinoma. Inflammatory process would be less likely. Direct visualization is suggested. 2. Cholelithiasis without evidence of acute cholecystitis. 3. Aortic atherosclerosis.   08/19/2021 Procedure   Colonoscopy, under Dr. Meridee Score  Impression: - Rectal tenderness, palpable rectal mass and hemorrhoids found on digital rectal exam. - Stool in the entire examined colon - lavaged with still inadequate clearance. - One 35 mm polyp at the hepatic flexure. Biopsied. Tattooed distal in case future endoscopic resection is considered - however, has larger issues with rectal mass currently as below. - Four, 5 to 11 mm polyps in the  transverse colon and at the hepatic flexure, removed with a cold snare. Resected and retrieved. - Diverticulosis in the recto-sigmoid colon and in the sigmoid colon. - Rule out malignancy, partially obstructing tumor in the rectum and from 6 to 13 cm proximal to the anus. Biopsied.   08/19/2021 Pathology Results   Diagnosis 1. Hepatic Flexure Biopsy - TUBULAR ADENOMA WITHOUT HIGH-GRADE DYSPLASIA OR MALIGNANCY 2. Transverse Colon Biopsy, and hepatic flexure, polyps (4) - TUBULAR ADENOMA WITHOUT HIGH-GRADE DYSPLASIA OR MALIGNANCY - OTHER FRAGMENTS OF POLYPOID COLONIC MUCOSA WITH NO SPECIFIC HISTOPATHOLOGIC CHANGES - FOOD MATERIAL 3. Rectum, biopsy - ADENOCARCINOMA. SEE NOTE   08/22/2021 Initial Diagnosis   Rectal cancer (HCC)   08/26/2021 Imaging   IMPRESSION: 1. A 2.2 x 1.9 cm right middle lobe pulmonary nodule as well as a total of four right upper lobe pulmonary nodule and masses measuring up to 3.6 cm. Findings concerning for metastatic primary lung cancer versus less likely metastases in a patient with rectal cancer. Additional imaging evaluation or consultation with Pulmonology or Thoracic Surgery recommended. 2. No gross hilar adenopathy, noting limited sensitivity for the detection of hilar adenopathy on this noncontrast study. 3. Cholelithiasis. 4.  Emphysema (ICD10-J43.9). 5. At  least left anterior descending coronary artery calcifications.   08/27/2021 Imaging   IMPRESSION: Rectal adenocarcinoma T stage: T4 B   Rectal adenocarcinoma N stage: N2 disease likely associated with early extra mesorectal lymph node involvement.   Distance from tumor to the internal anal sphincter is 1.2 cm.   Also with extramural venous involvement as described.   08/29/2021 Cancer Staging   Staging form: Colon and Rectum, AJCC 8th Edition - Clinical stage from 08/29/2021: Jorge [name], cM1 - Signed by Malachy Mood, MD on 08/29/2021 Stage prefix: Initial diagnosis   01/23/2022 Imaging   CT CAP  w contrast IMPRESSION: 1. Mild interval decrease in size of multiple right-sided pulmonary nodules. No new suspicious pulmonary nodule or mass. 2. Interval development of approximately 10 new ill-defined hypoattenuating liver lesions ranging in size from approximately 5-10 mm. Imaging features suspicious for metastatic disease. MRI abdomen with and without contrast may prove helpful to further evaluate. 3. Similar appearance of wall thickening in the rectum with perirectal edema. 4. Cholelithiasis. 5. Prostatomegaly. 6. Aortic Atherosclerosis (ICD10-I70.0).   11/21/2022 Imaging    IMPRESSION: 1. Stable exam. No new or progressive interval findings. 2. The primary rectosigmoid lesion described previously is not well seen on the current study. There is presacral and perirectal edema, likely treatment related. 3. No substantial change in appearance of bilateral pulmonary metastases. 4. Stable tiny hypodensities in both hepatic lobes. These were previously characterized as metastatic disease. No new liver lesions on today's exam. 5. Cholelithiasis. 6. 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)   10/14/2021 - 01/25/2022 Chemotherapy   Patient is on Treatment Plan : COLORECTAL CapeOx + Bevacizumab q21d     02/21/2022 - 06/29/2022 Chemotherapy   Patient is on Treatment Plan : COLORECTAL FOLFIRI / BEVACIZUMAB Q14D     02/21/2022 - 11/16/2023 Chemotherapy   Patient is on Treatment Plan : COLORECTAL FOLFIRI + Bevacizumab q14d     12/13/2023 -  Chemotherapy   Patient is on Treatment Plan : COLORECTAL Bevacizumab + Trifluridine/Tipiracil q28d         REVIEW OF SYSTEMS:   Constitutional: Denies fevers, chills or abnormal weight loss Eyes: Denies blurriness of vision Ears, nose, mouth, throat, and face: Denies mucositis or sore  throat Respiratory: Denies cough, dyspnea or wheezes Cardiovascular: Denies palpitation, chest discomfort or lower extremity swelling Gastrointestinal:  Denies nausea, heartburn or change in bowel habits.  Continues to have diarrhea.  Manages with Imodium and Lomotil.  Needs new prescription for Lomotil. Skin: Denies abnormal skin rashes Lymphatics: Denies new lymphadenopathy or easy bruising Neurological:Denies numbness, tingling or new weaknesses Behavioral/Psych: Mood is stable, no new changes  All other systems were reviewed with the patient and are negative.   VITALS:   Today's Vitals   01/17/24 1417  BP: 138/70  Pulse: 77  Resp: 17  Temp: 97.8 F (36.6 C)  SpO2: 98%  Weight: 146 lb 11.2 oz (66.5 kg)  PainSc: 0-No pain   Body mass index is 19.9 kg/m.   Wt Readings from Last 3 Encounters:  01/17/24 146 lb 11.2 oz (66.5 kg)  01/03/24 146 lb 14.4 oz (66.6 kg)  12/13/23 145 lb 14.4 oz (66.2 kg)    Body mass index is 19.9 kg/m.  Performance status (ECOG): 1 - Symptomatic but completely ambulatory  PHYSICAL EXAM:   GENERAL:alert, no distress and comfortable SKIN: skin color, texture, turgor are normal,  no rashes or significant lesions EYES: normal, Conjunctiva are pink and non-injected, sclera clear OROPHARYNX:no exudate, no erythema and lips, buccal mucosa, and tongue normal  NECK: supple, thyroid normal size, non-tender, without nodularity LYMPH:  no palpable lymphadenopathy in the cervical, axillary or inguinal LUNGS: clear to auscultation and percussion with normal breathing effort HEART: regular rate & rhythm and no murmurs and no lower extremity edema ABDOMEN:abdomen soft, non-tender and normal bowel sounds Musculoskeletal:no cyanosis of digits and no clubbing  NEURO: alert & oriented x 3 with fluent speech, no focal motor/sensory deficits  LABORATORY DATA:  I have reviewed the data as listed    Component Value Date/Time   NA 136 01/17/2024 1259   K 3.9  01/17/2024 1259   CL 102 01/17/2024 1259   CO2 31 01/17/2024 1259   GLUCOSE 115 (H) 01/17/2024 1259   BUN 14 01/17/2024 1259   CREATININE 0.86 01/17/2024 1259   CALCIUM 8.5 (L) 01/17/2024 1259   PROT 7.0 01/17/2024 1259   ALBUMIN 3.5 01/17/2024 1259   AST 21 01/17/2024 1259   ALT 12 01/17/2024 1259   ALKPHOS 91 01/17/2024 1259   BILITOT 0.6 01/17/2024 1259   GFRNONAA >60 01/17/2024 1259     Lab Results  Component Value Date   WBC 2.9 (L) 01/17/2024   NEUTROABS 1.4 (L) 01/17/2024   HGB 13.3 01/17/2024   HCT 40.6 01/17/2024   MCV 92.1 01/17/2024   PLT 174 01/17/2024

## 2024-01-17 NOTE — Patient Instructions (Signed)
 [facility] - A DEPT OF [facility]  Discharge Instructions: Thank you for choosing [facility] to provide your oncology and hematology care.   If you have a lab appointment with the Cancer Center, please go directly to the Cancer Center and check in at the registration area.   Wear comfortable clothing and clothing appropriate for easy access to any Portacath or PICC line.   We strive to give you quality time with your provider. You may need to reschedule your appointment if you arrive late (15 or more minutes).  Arriving late affects you and other patients whose appointments are after yours.  Also, if you miss three or more appointments without notifying the office, you may be dismissed from the clinic at the provider's discretion.      For prescription refill requests, have your pharmacy contact our office and allow 72 hours for refills to be completed.    Today you received the following chemotherapy and/or immunotherapy agents: Bevacizumab      To help prevent nausea and vomiting after your treatment, we encourage you to take your nausea medication as directed.  BELOW ARE SYMPTOMS THAT SHOULD BE REPORTED IMMEDIATELY: *FEVER GREATER THAN 100.4 F (38 C) OR HIGHER *CHILLS OR SWEATING *NAUSEA AND VOMITING THAT IS NOT CONTROLLED WITH YOUR NAUSEA MEDICATION *UNUSUAL SHORTNESS OF BREATH *UNUSUAL BRUISING OR BLEEDING *URINARY PROBLEMS (pain or burning when urinating, or frequent urination) *BOWEL PROBLEMS (unusual diarrhea, constipation, pain near the anus) TENDERNESS IN MOUTH AND THROAT WITH OR WITHOUT PRESENCE OF ULCERS (sore throat, sores in mouth, or a toothache) UNUSUAL RASH, SWELLING OR PAIN  UNUSUAL VAGINAL DISCHARGE OR ITCHING   Items with * indicate a potential emergency and should be followed up as soon as possible or go to the Emergency Department if any problems should occur.  Please show the CHEMOTHERAPY ALERT CARD or  IMMUNOTHERAPY ALERT CARD at check-in to the Emergency Department and triage nurse.  Should you have questions after your visit or need to cancel or reschedule your appointment, please contact [facility] - A DEPT OF [facility]  Dept: [phone]  and follow the prompts.  Office hours are 8:00 a.m. to 4:30 p.m. Monday - Friday. Please note that voicemails left after 4:00 p.m. may not be returned until the following business day.  We are closed weekends and major holidays. You have access to a nurse at all times for urgent questions. Please call the main number to the clinic Dept: [phone] and follow the prompts.   For any non-urgent questions, you may also contact your provider using MyChart. We now offer e-Visits for anyone 67 and older to request care online for non-urgent symptoms. For details visit [email or web address].   Also download the MyChart app! Go to the app store, search "MyChart", open the app, select [facility], and log in with your MyChart username and password.

## 2024-01-17 NOTE — Assessment & Plan Note (Addendum)
 [id number] with lung metastasis, MMR normal, KRAS(+) [id number]  -diagnosed 08/2021 by colonoscopy for rectal bleeding and frequent BM. Staging MRI showed early extra mesorectal lymph node involvement.  -baseline CEA 571.84 on 09/06/21.  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 Xeloda dose reduced due to elevated liver enzymes.  -due to new liver lesions, we switched to second-line FOLFIRI, with continued beva, on 02/21/22. He tolerates well overall.  - restaging CT scan on April 22 and June 26 showed stable disease overall. -CT 10/09/2023 showed mild disease progression in lungs, his tumor marker CEA has been going up also.  -Plan to change his chemo to lonsurf and beva, he started on 12/13/2023 -Patient has tolerated new treatment well.  Continue as scheduled.

## 2024-01-18 ENCOUNTER — Encounter: Payer: Self-pay | Admitting: Family Medicine

## 2024-01-18 ENCOUNTER — Other Ambulatory Visit: Payer: Self-pay | Admitting: Nurse Practitioner

## 2024-01-18 ENCOUNTER — Ambulatory Visit: Payer: Self-pay

## 2024-01-18 DIAGNOSIS — G893 Neoplasm related pain (acute) (chronic): Secondary | ICD-10-CM

## 2024-01-18 DIAGNOSIS — Z515 Encounter for palliative care: Secondary | ICD-10-CM

## 2024-01-18 DIAGNOSIS — C7800 Secondary malignant neoplasm of unspecified lung: Secondary | ICD-10-CM

## 2024-01-18 DIAGNOSIS — C20 Malignant neoplasm of rectum: Secondary | ICD-10-CM

## 2024-01-18 MED ORDER — OXYCODONE HCL 5 MG PO TABS
5.0000 mg | ORAL_TABLET | Freq: Four times a day (QID) | ORAL | 0 refills | Status: DC | PRN
Start: 2024-01-22 — End: 2024-02-07

## 2024-01-18 NOTE — Patient Instructions (Signed)
 Visit Information  Thank you for taking time to visit with me today. Please don't hesitate to contact me if I can be of assistance to you.   Following are the goals we discussed today:   Goals Addressed             This Visit's Progress    COMPLETED: RN Care Coordination Activities: further follow up needed       Care Coordination Interventions: Completed successful outbound call with daughter Naithen Rivenburg  Evaluation of current treatment plan related to s/p Influenza/Pneumonia and patient's adherence to plan as established by provider Determined patient has made a full recovery from recent pneumonia with sepsis, he continues to work with PT and is making progress with getting stronger Discussed patient is now able to help care for this great grand daughter after school, his goal is to be able to walk to the store a block from their home Instructed daughter to notify patient's doctor of new or worsening symptoms  Reviewed and discussed patient's next upcoming scheduled PCP follow up      To receive assistance with cost of new Cancer drug   On track    Care Coordination Interventions: Assessed patient understanding of cancer diagnosis and recommended treatment plan Reviewed upcoming provider appointments and treatment appointments Assessed available transportation to appointments and treatments. Has consistent/reliable transportation: Yes Assessed support system. Has consistent/reliable family or other support: Yes Nutrition assessment performed Discussed plans with patient for ongoing care coordination follow up and provided patient with direct contact information for nurse care coordinator Scheduled next nurse follow up call with daughter [name] on 02/15/24 @10 :00 AM, following next PCP follow up         Our next appointment is by telephone on 02/15/24 at 10:00 AM  Please call the care guide team at [phone] if you need to cancel or reschedule your appointment.   If you are  experiencing a Mental Health or Behavioral Health Crisis or need someone to talk to, please call 1-800-273-TALK (toll free, 24 hour hotline)  Patient verbalizes understanding of instructions and care plan provided today and agrees to view in MyChart. Active MyChart status and patient understanding of how to access instructions and care plan via MyChart confirmed with patient.     Delsa Sale RN BSN CCM [place]  [facility], [facility] Nurse Care Coordinator  Direct Dial: [phone] Website: [email or web address]

## 2024-01-18 NOTE — Patient Outreach (Signed)
 Care Coordination   Follow Up Visit Note   01/18/2024 Name: Jorge [name] MRN: [id number] DOB: 1951/08/21  Jorge [name] is a 73 y.o. year old male who sees Ellender Hose, NP for primary care. I spoke with daughter Ashrith Sagan by phone today.  What matters to the patients health and wellness today?  Patient would like to get strong enough to walk to the store near his home.     Goals Addressed             This Visit's Progress    COMPLETED: RN Care Coordination Activities: further follow up needed       Care Coordination Interventions: Completed successful outbound call with daughter Jake Fuhrmann  Evaluation of current treatment plan related to s/p Influenza/Pneumonia and patient's adherence to plan as established by provider Determined patient has made a full recovery from recent pneumonia with sepsis, he continues to work with PT and is making progress with getting stronger Discussed patient is now able to help care for this great grand daughter after school, his goal is to be able to walk to the store a block from their home Instructed daughter to notify patient's doctor of new or worsening symptoms  Reviewed and discussed patient's next upcoming scheduled PCP follow up      To receive assistance with cost of new Cancer drug   On track    Care Coordination Interventions: Assessed patient understanding of cancer diagnosis and recommended treatment plan Reviewed upcoming provider appointments and treatment appointments Assessed available transportation to appointments and treatments. Has consistent/reliable transportation: Yes Assessed support system. Has consistent/reliable family or other support: Yes Nutrition assessment performed Discussed plans with patient for ongoing care coordination follow up and provided patient with direct contact information for nurse care coordinator Scheduled next nurse follow up call with daughter [name] on 02/15/24 @10 :00 AM, following next PCP  follow up         SDOH assessments and interventions completed:  Yes  SDOH Interventions Today    Flowsheet Row Most Recent Value  SDOH Interventions   Physical Activity Interventions Intervention Not Indicated        Care Coordination Interventions:  Yes, provided   Follow up plan: Follow up call scheduled for 02/15/24 @10 :00 AM    Encounter Outcome:  Patient Visit Completed

## 2024-01-20 ENCOUNTER — Encounter: Payer: Self-pay | Admitting: Nurse Practitioner

## 2024-01-20 ENCOUNTER — Encounter: Payer: Self-pay | Admitting: Hematology

## 2024-01-31 ENCOUNTER — Inpatient Hospital Stay: Payer: Medicare Other | Admitting: Hematology

## 2024-01-31 ENCOUNTER — Encounter: Payer: Self-pay | Admitting: Hematology

## 2024-01-31 ENCOUNTER — Inpatient Hospital Stay: Payer: Medicare Other

## 2024-01-31 VITALS — BP 153/86 | HR 60 | Temp 99.2°F | Resp 18 | Wt 148.3 lb

## 2024-01-31 DIAGNOSIS — Z95828 Presence of other vascular implants and grafts: Secondary | ICD-10-CM

## 2024-01-31 DIAGNOSIS — C20 Malignant neoplasm of rectum: Secondary | ICD-10-CM

## 2024-01-31 DIAGNOSIS — C7800 Secondary malignant neoplasm of unspecified lung: Secondary | ICD-10-CM

## 2024-01-31 DIAGNOSIS — Z5112 Encounter for antineoplastic immunotherapy: Secondary | ICD-10-CM | POA: Diagnosis not present

## 2024-01-31 LAB — CBC WITH DIFFERENTIAL (CANCER CENTER ONLY)
Abs Immature Granulocytes: 0.01 10*3/uL (ref 0.00–0.07)
Basophils Absolute: 0 10*3/uL (ref 0.0–0.1)
Basophils Relative: 0 %
Eosinophils Absolute: 0 10*3/uL (ref 0.0–0.5)
Eosinophils Relative: 2 %
HCT: 40.4 % (ref 39.0–52.0)
Hemoglobin: 13.2 g/dL (ref 13.0–17.0)
Immature Granulocytes: 0 %
Lymphocytes Relative: 35 %
Lymphs Abs: 0.9 10*3/uL (ref 0.7–4.0)
MCH: 30 pg (ref 26.0–34.0)
MCHC: 32.7 g/dL (ref 30.0–36.0)
MCV: 91.8 fL (ref 80.0–100.0)
Monocytes Absolute: 0.4 10*3/uL (ref 0.1–1.0)
Monocytes Relative: 14 %
Neutro Abs: 1.3 10*3/uL — ABNORMAL LOW (ref 1.7–7.7)
Neutrophils Relative %: 49 %
Platelet Count: 141 10*3/uL — ABNORMAL LOW (ref 150–400)
RBC: 4.4 MIL/uL (ref 4.22–5.81)
RDW: 18.6 % — ABNORMAL HIGH (ref 11.5–15.5)
WBC Count: 2.7 10*3/uL — ABNORMAL LOW (ref 4.0–10.5)
nRBC: 0 % (ref 0.0–0.2)

## 2024-01-31 LAB — CMP (CANCER CENTER ONLY)
ALT: 11 U/L (ref 0–44)
AST: 18 U/L (ref 15–41)
Albumin: 3.3 g/dL — ABNORMAL LOW (ref 3.5–5.0)
Alkaline Phosphatase: 104 U/L (ref 38–126)
Anion gap: 2 — ABNORMAL LOW (ref 5–15)
BUN: 13 mg/dL (ref 8–23)
CO2: 31 mmol/L (ref 22–32)
Calcium: 8.5 mg/dL — ABNORMAL LOW (ref 8.9–10.3)
Chloride: 104 mmol/L (ref 98–111)
Creatinine: 0.77 mg/dL (ref 0.61–1.24)
GFR, Estimated: >60 mL/min (ref >60)
Glucose, Bld: 111 mg/dL — ABNORMAL HIGH (ref 70–99)
Potassium: 3.8 mmol/L (ref 3.5–5.1)
Sodium: 137 mmol/L (ref 135–145)
Total Bilirubin: 0.5 mg/dL (ref 0.0–1.2)
Total Protein: 6.8 g/dL (ref 6.5–8.1)

## 2024-01-31 LAB — TOTAL PROTEIN, URINE DIPSTICK: Protein, ur: 100 mg/dL — AB

## 2024-01-31 MED ORDER — SODIUM CHLORIDE 0.9 % IV SOLN: INTRAVENOUS | Status: DC

## 2024-01-31 MED ORDER — SODIUM CHLORIDE 0.9 % IV SOLN
5.0000 mg/kg | Freq: Once | INTRAVENOUS | Status: AC
  Administered 2024-01-31: 325 mg via INTRAVENOUS
  Filled 2024-01-31: qty 13

## 2024-01-31 MED ORDER — SODIUM CHLORIDE 0.9% FLUSH
10.0000 mL | Freq: Once | INTRAVENOUS | Status: AC
Start: 2024-01-31 — End: 2024-01-31
  Administered 2024-01-31: 10 mL

## 2024-01-31 NOTE — Progress Notes (Signed)
 [facility]   Telephone:([phone]) [phone] Fax:(336) [phone]   Clinic Follow up Note   Patient Care Team: Ellender Hose, NP as PCP - General (Family Medicine) Croitoru, Rachelle Hora, MD as PCP - Cardiology (Cardiology) Pickenpack-Cousar, Arty Baumgartner, NP as Nurse Practitioner (Nurse Practitioner) Malachy Mood, MD as Consulting Physician (Hematology) Bridgett Larsson, LCSW as VBCI Care Management (Licensed Clinical Social Worker)  Date of Service:  01/31/2024  CHIEF COMPLAINT: f/u of metastatic rectal cancer  CURRENT THERAPY:  Lonsurf and bevacizumab  Oncology History   Rectal cancer (HCC) [phone] with lung metastasis, MMR normal, KRAS(+) [id number]  -diagnosed 08/2021 by colonoscopy for rectal bleeding and frequent BM. Staging MRI showed early extra mesorectal lymph node involvement.  -baseline CEA 571.84 on 09/06/21.  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 Xeloda dose reduced due to elevated liver enzymes.  -due to new liver lesions, we switched to second-line FOLFIRI, with continued beva, on 02/21/22. He tolerates well overall.  - restaging CT scan on April 22 and June 26 showed stable disease overall. -CT 10/09/2023 showed mild disease progression in lungs, his tumor marker CEA has been going up also.  -I changed his chemo to lonsurf and beva, he started on 12/13/2023    Assessment and Plan    Metastatic colon cancer He reports good energy levels, no abdominal pain, and a good appetite, with slight weight gain. Blood counts are well-managed, with a slightly low white count likely due to chemotherapy. He is on a chemotherapy regimen of three pills twice daily for two weeks, followed by a break, with a new cycle starting on April 7. Tumor marker was elevated two weeks ago but is expected to decrease with treatment. He tolerates the treatment  well, with no significant side effects. Tumor markers will be monitored every four weeks to assess response, and a scan is planned for April to evaluate treatment efficacy. - Continue current chemotherapy regimen with a new cycle starting on April 7. - Monitor tumor marker every four weeks. - Order a scan in April to assess treatment response. - Proceed with scheduled infusion today.  Pain management Pain is managed with oxycodone and gabapentin. He takes one oxycodone pill in the morning, which provides effective pain control throughout the day. - Continue current pain management regimen with oxycodone and gabapentin.  Follow-up Regular follow-up appointments are scheduled to monitor condition and treatment response. - Schedule follow-up appointments in two weeks, four weeks, and six weeks.     Plan -Patient is clinically doing well, will continue Lonsurf, he will start cycle 3 on April 7 -Lab reviewed, adequate for treatment, will proceed bevacizumab today and continue every 2 weeks -Plan to repeat CT scan around 4/21     SUMMARY OF ONCOLOGIC HISTORY: Oncology History Overview Note  Cancer Staging Rectal cancer [facility]) Staging form: Colon and Rectum, AJCC 8th Edition - Clinical stage from 08/29/2021: [name], cM1 - Signed by Malachy Mood, MD on 08/29/2021    Rectal cancer (HCC)  07/23/2021 Imaging   CT AP  IMPRESSION: 1. Appearance of the rectum likely indicates a rectal mass suspicious for rectal carcinoma. Inflammatory process would be less likely. Direct visualization is suggested. 2. Cholelithiasis without evidence of acute cholecystitis. 3. Aortic atherosclerosis.   08/19/2021 Procedure   Colonoscopy, under Dr. Meridee Score  Impression: - Rectal tenderness, palpable rectal mass and hemorrhoids found on digital rectal exam. -  Stool in the entire examined colon - lavaged with still inadequate clearance. - One 35 mm polyp at the hepatic flexure. Biopsied. Tattooed distal in  case future endoscopic resection is considered - however, has larger issues with rectal mass currently as below. - Four, 5 to 11 mm polyps in the transverse colon and at the hepatic flexure, removed with a cold snare. Resected and retrieved. - Diverticulosis in the recto-sigmoid colon and in the sigmoid colon. - Rule out malignancy, partially obstructing tumor in the rectum and from 6 to 13 cm proximal to the anus. Biopsied.   08/19/2021 Pathology Results   Diagnosis 1. Hepatic Flexure Biopsy - TUBULAR ADENOMA WITHOUT HIGH-GRADE DYSPLASIA OR MALIGNANCY 2. Transverse Colon Biopsy, and hepatic flexure, polyps (4) - TUBULAR ADENOMA WITHOUT HIGH-GRADE DYSPLASIA OR MALIGNANCY - OTHER FRAGMENTS OF POLYPOID COLONIC MUCOSA WITH NO SPECIFIC HISTOPATHOLOGIC CHANGES - FOOD MATERIAL 3. Rectum, biopsy - ADENOCARCINOMA. SEE NOTE   08/22/2021 Initial Diagnosis   Rectal cancer (HCC)   08/26/2021 Imaging   IMPRESSION: 1. A 2.2 x 1.9 cm right middle lobe pulmonary nodule as well as a total of four right upper lobe pulmonary nodule and masses measuring up to 3.6 cm. Findings concerning for metastatic primary lung cancer versus less likely metastases in a patient with rectal cancer. Additional imaging evaluation or consultation with Pulmonology or Thoracic Surgery recommended. 2. No gross hilar adenopathy, noting limited sensitivity for the detection of hilar adenopathy on this noncontrast study. 3. Cholelithiasis. 4.  Emphysema (ICD10-J43.9). 5. At least left anterior descending coronary artery calcifications.   08/27/2021 Imaging   IMPRESSION: Rectal adenocarcinoma T stage: T4 B   Rectal adenocarcinoma N stage: N2 disease likely associated with early extra mesorectal lymph node involvement.   Distance from tumor to the internal anal sphincter is 1.2 cm.   Also with extramural venous involvement as described.   08/29/2021 Cancer Staging   Staging form: Colon and Rectum, AJCC 8th Edition -  Clinical stage from 08/29/2021: [name], cM1 - Signed by Malachy Mood, MD on 08/29/2021 Stage prefix: Initial diagnosis   01/23/2022 Imaging   CT CAP w contrast IMPRESSION: 1. Mild interval decrease in size of multiple right-sided pulmonary nodules. No new suspicious pulmonary nodule or mass. 2. Interval development of approximately 10 new ill-defined hypoattenuating liver lesions ranging in size from approximately 5-10 mm. Imaging features suspicious for metastatic disease. MRI abdomen with and without contrast may prove helpful to further evaluate. 3. Similar appearance of wall thickening in the rectum with perirectal edema. 4. Cholelithiasis. 5. Prostatomegaly. 6. Aortic Atherosclerosis (ICD10-I70.0).   11/21/2022 Imaging    IMPRESSION: 1. Stable exam. No new or progressive interval findings. 2. The primary rectosigmoid lesion described previously is not well seen on the current study. There is presacral and perirectal edema, likely treatment related. 3. No substantial change in appearance of bilateral pulmonary metastases. 4. Stable tiny hypodensities in both hepatic lobes. These were previously characterized as metastatic disease. No new liver lesions on today's exam. 5. Cholelithiasis. 6. 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  History of Present Illness   A 73 year old patient with a history of metastatic colon cancer presents for a routine follow-up visit. The patient reports feeling well overall with good energy levels. He denies any stomach pain and is able to perform daily activities, including caring for his grandchildren. He is currently on a chemotherapy regimen involving taking three pills twice a day for two weeks, with five days on and two days off. He also reports taking oxycodone once daily for pain management, which he states is effective in controlling his pain throughout the day. Additionally, he is on gabapentin. The patient is due to start a new cycle of chemotherapy on April 7th.         All other systems were reviewed with the patient and are negative.  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    SURGICAL HISTORY: Past Surgical History:  Procedure Laterality Date   BRONCHIAL BIOPSY  09/27/2021   Procedure: BRONCHIAL BIOPSIES;  Surgeon: Josephine Igo, DO;  Location: [facility];  Service: Pulmonary;;   BRONCHIAL BRUSHINGS  09/27/2021   Procedure: BRONCHIAL BRUSHINGS;  Surgeon: Josephine Igo, DO;  Location: [facility];  Service: Pulmonary;;   BRONCHIAL NEEDLE ASPIRATION BIOPSY  09/27/2021   Procedure: BRONCHIAL NEEDLE ASPIRATION BIOPSIES;  Surgeon: Josephine Igo, DO;  Location: [facility];  Service: Pulmonary;;   COLONOSCOPY     IR IMAGING GUIDED PORT INSERTION  10/24/2021   left breast surgery Left 1968   VIDEO BRONCHOSCOPY WITH RADIAL ENDOBRONCHIAL ULTRASOUND  09/27/2021   Procedure: RADIAL ENDOBRONCHIAL ULTRASOUND;  Surgeon: Josephine Igo, DO;  Location: [facility];  Service: Pulmonary;;    I have reviewed the social history and family history with the patient and they are unchanged  from previous note.  ALLERGIES:  has no known allergies.  MEDICATIONS:  Current Outpatient Medications  Medication Sig Dispense Refill   amLODipine (NORVASC) 10 MG tablet Take 1 tablet (10 mg total) by mouth daily. 90 tablet 1   diphenoxylate-atropine (LOMOTIL) 2.5-0.025 MG tablet Take 1-2 tablets by mouth 4 (four) times daily as needed for diarrhea or loose stools. 90 tablet 1   gabapentin (NEURONTIN) 100 MG capsule Take 2 capsules (200 mg total) by mouth 3 (three) times daily. 180 capsule 2   LONSURF 20-8.19 MG tablet TAKE THREE 20MG  TABLETS FOR A TOTAL DOSE OF (60MG ) BY MOUTH TWICE DAILY ON DAYS 1-5 AND DAYS 8-12 OF EACH 28 DAY CYCLE. 60 tablet 8   metoprolol tartrate (LOPRESSOR) 25 MG tablet Take 1 tablet (25 mg total) by mouth 2 (two) times daily. 60 tablet 0   Multiple Vitamin (MULTIVITAMIN WITH MINERALS) TABS tablet Take 1 tablet by mouth daily.     ondansetron (ZOFRAN) 8 MG tablet Take 1 tablet (8 mg total) by mouth every 8 (eight) hours as needed for nausea or vomiting. 30 tablet 3   oxyCODONE (OXY IR/ROXICODONE) 5 MG immediate release tablet Take 1 tablet (5 mg total) by mouth every 6 (six) hours as needed for severe pain (pain score 7-10) or breakthrough pain. 60 tablet 0   pantoprazole (  PROTONIX) 40 MG tablet Take 1 tablet (40 mg total) by mouth daily before breakfast. 90 tablet 0   prochlorperazine (COMPAZINE) 10 MG tablet Take 1 tablet (10 mg total) by mouth every 6 (six) hours as needed (Nausea or vomiting). 30 tablet 1   Pseudoephedrine-APAP-DM (DAYQUIL PO) Take 1 tablet by mouth as needed.     No current facility-administered medications for this visit.   Facility-Administered Medications Ordered in Other Visits  Medication Dose Route Frequency Provider Last Rate Last Admin   0.9 %  sodium chloride infusion   Intravenous Continuous Malachy Mood, MD   Stopped at 01/31/24 1348    PHYSICAL EXAMINATION: ECOG PERFORMANCE STATUS: 1 - Symptomatic but completely ambulatory  Vitals:    01/31/24 1223 01/31/24 1224  BP: (!) 157/84 (!) 153/86  Pulse:    Resp:    Temp:    SpO2:     Wt Readings from Last 3 Encounters:  01/31/24 148 lb 4.8 oz (67.3 kg)  01/17/24 146 lb 11.2 oz (66.5 kg)  01/03/24 146 lb 14.4 oz (66.6 kg)     GENERAL:alert, no distress and comfortable SKIN: skin color, texture, turgor are normal, no rashes or significant lesions EYES: normal, Conjunctiva are pink and non-injected, sclera clear NECK: supple, thyroid normal size, non-tender, without nodularity LYMPH:  no palpable lymphadenopathy in the cervical, axillary  LUNGS: clear to auscultation and percussion with normal breathing effort HEART: regular rate & rhythm and no murmurs and no lower extremity edema ABDOMEN:abdomen soft, non-tender and normal bowel sounds Musculoskeletal:no cyanosis of digits and no clubbing  NEURO: alert & oriented x 3 with fluent speech, no focal motor/sensory deficits    LABORATORY DATA:  I have reviewed the data as listed    Latest Ref Rng & Units 01/31/2024   11:56 AM 01/17/2024   12:59 PM 01/03/2024    9:56 AM  CBC  WBC 4.0 - 10.5 K/uL 2.7  2.9  2.8   Hemoglobin 13.0 - 17.0 g/dL 02.7  25.3  66.4   Hematocrit 39.0 - 52.0 % 40.4  40.6  39.4   Platelets 150 - 400 K/uL 141  174  147         Latest Ref Rng & Units 01/31/2024   11:56 AM 01/17/2024   12:59 PM 01/03/2024    9:56 AM  CMP  Glucose 70 - 99 mg/dL 403  474  259   BUN 8 - 23 mg/dL 13  14  10    Creatinine 0.61 - 1.24 mg/dL 5.63  8.75  6.43   Sodium 135 - 145 mmol/L 137  136  137   Potassium 3.5 - 5.1 mmol/L 3.8  3.9  4.5   Chloride 98 - 111 mmol/L 104  102  103   CO2 22 - 32 mmol/L 31  31  32   Calcium 8.9 - 10.3 mg/dL 8.5  8.5  8.6   Total Protein 6.5 - 8.1 g/dL 6.8  7.0  6.9   Total Bilirubin 0.0 - 1.2 mg/dL 0.5  0.6  0.9   Alkaline Phos 38 - 126 U/L 104  91  95   AST 15 - 41 U/L 18  21  19    ALT 0 - 44 U/L 11  12  12        RADIOGRAPHIC STUDIES: I have personally reviewed the  radiological images as listed and agreed with the findings in the report. No results found.    Orders Placed This Encounter  Procedures   CT CHEST  ABDOMEN PELVIS W CONTRAST    Standing Status:   Future    Expected Date:   02/25/2024    Expiration Date:   01/30/2025    If indicated for the ordered procedure, I authorize the administration of contrast media per Radiology protocol:   Yes    Does the patient have a contrast media/X-ray dye allergy?:   No    Preferred imaging location?:   [facility]    If indicated for the ordered procedure, I authorize the administration of oral contrast media per Radiology protocol:   Yes   CBC with Differential (Cancer Center Only)    Standing Status:   Future    Expected Date:   04/26/2024    Expiration Date:   04/26/2025   CBC with Differential (Cancer Center Only)    Standing Status:   Future    Expected Date:   05/10/2024    Expiration Date:   05/10/2025   CEA (Access)-[facility] ONLY    Standing Status:   Standing    Number of Occurrences:   30    Expiration Date:   01/30/2025   All questions were answered. The patient knows to call the clinic with any problems, questions or concerns. No barriers to learning was detected. The total time spent in the appointment was 25 minutes.     Malachy Mood, MD 01/31/2024

## 2024-01-31 NOTE — Assessment & Plan Note (Signed)
 [id number] with lung metastasis, MMR normal, KRAS(+) [id number]  -diagnosed 08/2021 by colonoscopy for rectal bleeding and frequent BM. Staging MRI showed early extra mesorectal lymph node involvement.  -baseline CEA 571.84 on 09/06/21.  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 Xeloda dose reduced due to elevated liver enzymes.  -due to new liver lesions, we switched to second-line FOLFIRI, with continued beva, on 02/21/22. He tolerates well overall.  - restaging CT scan on April 22 and June 26 showed stable disease overall. -CT 10/09/2023 showed mild disease progression in lungs, his tumor marker CEA has been going up also.  -I changed his chemo to lonsurf and beva, he started on 12/13/2023

## 2024-01-31 NOTE — Patient Instructions (Signed)

## 2024-01-31 NOTE — Patient Instructions (Signed)
 [facility] - A DEPT OF [facility]  Discharge Instructions: Thank you for choosing [facility] to provide your oncology and hematology care.   If you have a lab appointment with the Cancer Center, please go directly to the Cancer Center and check in at the registration area.   Wear comfortable clothing and clothing appropriate for easy access to any Portacath or PICC line.   We strive to give you quality time with your provider. You may need to reschedule your appointment if you arrive late (15 or more minutes).  Arriving late affects you and other patients whose appointments are after yours.  Also, if you miss three or more appointments without notifying the office, you may be dismissed from the clinic at the provider's discretion.      For prescription refill requests, have your pharmacy contact our office and allow 72 hours for refills to be completed.    Today you received the following chemotherapy and/or immunotherapy agents: Bevacizumab      To help prevent nausea and vomiting after your treatment, we encourage you to take your nausea medication as directed.  BELOW ARE SYMPTOMS THAT SHOULD BE REPORTED IMMEDIATELY: *FEVER GREATER THAN 100.4 F (38 C) OR HIGHER *CHILLS OR SWEATING *NAUSEA AND VOMITING THAT IS NOT CONTROLLED WITH YOUR NAUSEA MEDICATION *UNUSUAL SHORTNESS OF BREATH *UNUSUAL BRUISING OR BLEEDING *URINARY PROBLEMS (pain or burning when urinating, or frequent urination) *BOWEL PROBLEMS (unusual diarrhea, constipation, pain near the anus) TENDERNESS IN MOUTH AND THROAT WITH OR WITHOUT PRESENCE OF ULCERS (sore throat, sores in mouth, or a toothache) UNUSUAL RASH, SWELLING OR PAIN  UNUSUAL VAGINAL DISCHARGE OR ITCHING   Items with * indicate a potential emergency and should be followed up as soon as possible or go to the Emergency Department if any problems should occur.  Please show the CHEMOTHERAPY ALERT CARD or  IMMUNOTHERAPY ALERT CARD at check-in to the Emergency Department and triage nurse.  Should you have questions after your visit or need to cancel or reschedule your appointment, please contact [facility] - A DEPT OF [facility]  Dept: [phone]  and follow the prompts.  Office hours are 8:00 a.m. to 4:30 p.m. Monday - Friday. Please note that voicemails left after 4:00 p.m. may not be returned until the following business day.  We are closed weekends and major holidays. You have access to a nurse at all times for urgent questions. Please call the main number to the clinic Dept: [phone] and follow the prompts.   For any non-urgent questions, you may also contact your provider using MyChart. We now offer e-Visits for anyone 67 and older to request care online for non-urgent symptoms. For details visit [email or web address].   Also download the MyChart app! Go to the app store, search "MyChart", open the app, select [facility], and log in with your MyChart username and password.

## 2024-02-07 ENCOUNTER — Other Ambulatory Visit: Payer: Self-pay | Admitting: Nurse Practitioner

## 2024-02-07 DIAGNOSIS — C20 Malignant neoplasm of rectum: Secondary | ICD-10-CM

## 2024-02-07 DIAGNOSIS — G893 Neoplasm related pain (acute) (chronic): Secondary | ICD-10-CM

## 2024-02-07 DIAGNOSIS — Z515 Encounter for palliative care: Secondary | ICD-10-CM

## 2024-02-07 MED ORDER — OXYCODONE HCL 5 MG PO TABS
5.0000 mg | ORAL_TABLET | Freq: Four times a day (QID) | ORAL | 0 refills | Status: DC | PRN
Start: 2024-02-07 — End: 2024-02-19

## 2024-02-14 ENCOUNTER — Encounter: Payer: Self-pay | Admitting: Nurse Practitioner

## 2024-02-14 ENCOUNTER — Inpatient Hospital Stay: Payer: Medicare Other

## 2024-02-14 ENCOUNTER — Inpatient Hospital Stay: Payer: Medicare Other | Attending: Physician Assistant

## 2024-02-14 ENCOUNTER — Encounter: Payer: Self-pay | Admitting: Hematology

## 2024-02-14 ENCOUNTER — Inpatient Hospital Stay: Payer: Medicare Other | Admitting: Hematology

## 2024-02-14 ENCOUNTER — Inpatient Hospital Stay (HOSPITAL_BASED_OUTPATIENT_CLINIC_OR_DEPARTMENT_OTHER): Admitting: Nurse Practitioner

## 2024-02-14 VITALS — BP 138/68 | HR 78 | Temp 97.8°F | Resp 17 | Wt 148.5 lb

## 2024-02-14 DIAGNOSIS — C7800 Secondary malignant neoplasm of unspecified lung: Secondary | ICD-10-CM

## 2024-02-14 DIAGNOSIS — Z515 Encounter for palliative care: Secondary | ICD-10-CM

## 2024-02-14 DIAGNOSIS — C20 Malignant neoplasm of rectum: Secondary | ICD-10-CM | POA: Diagnosis not present

## 2024-02-14 DIAGNOSIS — M792 Neuralgia and neuritis, unspecified: Secondary | ICD-10-CM

## 2024-02-14 DIAGNOSIS — C7801 Secondary malignant neoplasm of right lung: Secondary | ICD-10-CM | POA: Insufficient documentation

## 2024-02-14 DIAGNOSIS — R530 Neoplastic (malignant) related fatigue: Secondary | ICD-10-CM | POA: Diagnosis not present

## 2024-02-14 DIAGNOSIS — G893 Neoplasm related pain (acute) (chronic): Secondary | ICD-10-CM | POA: Diagnosis not present

## 2024-02-14 DIAGNOSIS — Z95828 Presence of other vascular implants and grafts: Secondary | ICD-10-CM

## 2024-02-14 LAB — CBC WITH DIFFERENTIAL (CANCER CENTER ONLY)
Abs Immature Granulocytes: 0.01 10*3/uL (ref 0.00–0.07)
Basophils Absolute: 0 10*3/uL (ref 0.0–0.1)
Basophils Relative: 1 %
Eosinophils Absolute: 0 10*3/uL (ref 0.0–0.5)
Eosinophils Relative: 1 %
HCT: 42 % (ref 39.0–52.0)
Hemoglobin: 13.7 g/dL (ref 13.0–17.0)
Immature Granulocytes: 0 %
Lymphocytes Relative: 29 %
Lymphs Abs: 1.1 10*3/uL (ref 0.7–4.0)
MCH: 30.4 pg (ref 26.0–34.0)
MCHC: 32.6 g/dL (ref 30.0–36.0)
MCV: 93.3 fL (ref 80.0–100.0)
Monocytes Absolute: 0.6 10*3/uL (ref 0.1–1.0)
Monocytes Relative: 16 %
Neutro Abs: 2 10*3/uL (ref 1.7–7.7)
Neutrophils Relative %: 53 %
Platelet Count: 165 10*3/uL (ref 150–400)
RBC: 4.5 MIL/uL (ref 4.22–5.81)
RDW: 17.1 % — ABNORMAL HIGH (ref 11.5–15.5)
WBC Count: 3.8 10*3/uL — ABNORMAL LOW (ref 4.0–10.5)
nRBC: 0 % (ref 0.0–0.2)

## 2024-02-14 LAB — CMP (CANCER CENTER ONLY)
ALT: 12 U/L (ref 0–44)
AST: 21 U/L (ref 15–41)
Albumin: 3.4 g/dL — ABNORMAL LOW (ref 3.5–5.0)
Alkaline Phosphatase: 97 U/L (ref 38–126)
Anion gap: 3 — ABNORMAL LOW (ref 5–15)
BUN: 8 mg/dL (ref 8–23)
CO2: 32 mmol/L (ref 22–32)
Calcium: 9 mg/dL (ref 8.9–10.3)
Chloride: 103 mmol/L (ref 98–111)
Creatinine: 0.84 mg/dL (ref 0.61–1.24)
GFR, Estimated: >60 mL/min (ref >60)
Glucose, Bld: 101 mg/dL — ABNORMAL HIGH (ref 70–99)
Potassium: 4 mmol/L (ref 3.5–5.1)
Sodium: 138 mmol/L (ref 135–145)
Total Bilirubin: 0.6 mg/dL (ref 0.0–1.2)
Total Protein: 7.2 g/dL (ref 6.5–8.1)

## 2024-02-14 LAB — TOTAL PROTEIN, URINE DIPSTICK: Protein, ur: >=300 mg/dL — AB

## 2024-02-14 LAB — CEA (ACCESS): CEA (CHCC): 233.85 ng/mL — ABNORMAL HIGH (ref 0.00–5.00)

## 2024-02-14 MED ORDER — SODIUM CHLORIDE 0.9% FLUSH
10.0000 mL | Freq: Once | INTRAVENOUS | Status: AC
  Administered 2024-02-14: 10 mL

## 2024-02-14 NOTE — Assessment & Plan Note (Signed)
 [id number] with lung metastasis, MMR normal, KRAS(+) [id number]  -diagnosed 08/2021 by colonoscopy for rectal bleeding and frequent BM. Staging MRI showed early extra mesorectal lymph node involvement.  -baseline CEA 571.84 on 09/06/21.  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 Xeloda dose reduced due to elevated liver enzymes.  -due to new liver lesions, we switched to second-line FOLFIRI, with continued beva, on 02/21/22. He tolerates well overall.  - restaging CT scan on April 22 and June 26 showed stable disease overall. -CT 10/09/2023 showed mild disease progression in lungs, his tumor marker CEA has been going up also.  -I changed his chemo to lonsurf and beva, he started on 12/13/2023

## 2024-02-14 NOTE — Progress Notes (Signed)
 [facility]   Telephone:(336) [phone] Fax:(336) [phone]   Clinic Follow up Note   Patient Care Team: Ellender Hose, NP as PCP - General (Family Medicine) Croitoru, Rachelle Hora, MD as PCP - Cardiology (Cardiology) Pickenpack-Cousar, Arty Baumgartner, NP as Nurse Practitioner (Nurse Practitioner) Malachy Mood, MD as Consulting Physician (Hematology) Bridgett Larsson, LCSW as VBCI Care Management (Licensed Clinical Social Worker)  Date of Service:  02/14/2024  CHIEF COMPLAINT: f/u of metastatic rectal cancer  CURRENT THERAPY:  Lonsurf and bevacizumab  Oncology History   Rectal cancer (HCC) [phone] with lung metastasis, MMR normal, KRAS(+) [id number]  -diagnosed 08/2021 by colonoscopy for rectal bleeding and frequent BM. Staging MRI showed early extra mesorectal lymph node involvement.  -baseline CEA 571.84 on 09/06/21.  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 Xeloda dose reduced due to elevated liver enzymes.  -due to new liver lesions, we switched to second-line FOLFIRI, with continued beva, on 02/21/22. He tolerates well overall.  - restaging CT scan on April 22 and June 26 showed stable disease overall. -CT 10/09/2023 showed mild disease progression in lungs, his tumor marker CEA has been going up also.  -I changed his chemo to lonsurf and beva, he started on 12/13/2023   Assessment & Plan Metastatic colon cancer Currently in the third cycle of Lonsurf, an oral chemotherapy regimen, with a schedule of two weeks on medication followed by a two-week break. He reports good appetite and no significant gastrointestinal issues, though experienced stomach discomfort after consuming a meal with hot sauce. Blood counts are consistent with Lonsurf's side effect profile, showing no anemia. Elevated urine protein levels necessitated the cancellation of today's  infusion. The effectiveness of the current treatment regimen is uncertain, pending results of an upcoming CT scan scheduled for February 25, 2024. Treatment modification may be necessary if cancer progresses. - Cancel today's infusion due to elevated urine protein levels. - Order CT scan on February 25, 2024, to assess treatment efficacy. - Deaccess port before he goes home. - Monitor urine protein levels and consider treatment modification if cancer progresses.  Diarrhea Occasional diarrhea, well-managed with Lomotil taken as needed. He finds the medication effective in controlling symptoms. - Check Lomotil bottle for refills and contact pharmacy if needed.  Plan -He started to [facility] cycle 3 on 8.7, will continue  -Lab reviewed, due to significant high proteinuria, will cancel bevacizumab infusion today -He is scheduled for restaging CT scan on February 25, 2024 -Lab, follow-up and bevacizumab in 2 weeks, and will review his next CT      SUMMARY OF ONCOLOGIC HISTORY: Oncology History Overview Note  Cancer Staging Rectal cancer [facility]) Staging form: Colon and Rectum, AJCC 8th Edition - Clinical stage from 08/29/2021: [name], cM1 - Signed by Malachy Mood, MD on 08/29/2021    Rectal cancer (HCC)  07/23/2021 Imaging   CT AP  IMPRESSION: 1. Appearance of the rectum likely indicates a rectal mass suspicious for rectal carcinoma. Inflammatory process would be less likely. Direct visualization is suggested. 2. Cholelithiasis without evidence of acute cholecystitis. 3. Aortic atherosclerosis.   08/19/2021 Procedure   Colonoscopy, under Dr. Meridee Score  Impression: - Rectal tenderness, palpable rectal mass and hemorrhoids found on digital rectal exam. - Stool in the entire examined colon - lavaged with still inadequate clearance. - One 35 mm polyp at the hepatic flexure. Biopsied. Tattooed distal in case future endoscopic resection is  considered - however, has larger issues with rectal mass  currently as below. - Four, 5 to 11 mm polyps in the transverse colon and at the hepatic flexure, removed with a cold snare. Resected and retrieved. - Diverticulosis in the recto-sigmoid colon and in the sigmoid colon. - Rule out malignancy, partially obstructing tumor in the rectum and from 6 to 13 cm proximal to the anus. Biopsied.   08/19/2021 Pathology Results   Diagnosis 1. Hepatic Flexure Biopsy - TUBULAR ADENOMA WITHOUT HIGH-GRADE DYSPLASIA OR MALIGNANCY 2. Transverse Colon Biopsy, and hepatic flexure, polyps (4) - TUBULAR ADENOMA WITHOUT HIGH-GRADE DYSPLASIA OR MALIGNANCY - OTHER FRAGMENTS OF POLYPOID COLONIC MUCOSA WITH NO SPECIFIC HISTOPATHOLOGIC CHANGES - FOOD MATERIAL 3. Rectum, biopsy - ADENOCARCINOMA. SEE NOTE   08/22/2021 Initial Diagnosis   Rectal cancer (HCC)   08/26/2021 Imaging   IMPRESSION: 1. A 2.2 x 1.9 cm right middle lobe pulmonary nodule as well as a total of four right upper lobe pulmonary nodule and masses measuring up to 3.6 cm. Findings concerning for metastatic primary lung cancer versus less likely metastases in a patient with rectal cancer. Additional imaging evaluation or consultation with Pulmonology or Thoracic Surgery recommended. 2. No gross hilar adenopathy, noting limited sensitivity for the detection of hilar adenopathy on this noncontrast study. 3. Cholelithiasis. 4.  Emphysema (ICD10-J43.9). 5. At least left anterior descending coronary artery calcifications.   08/27/2021 Imaging   IMPRESSION: Rectal adenocarcinoma T stage: T4 B   Rectal adenocarcinoma N stage: N2 disease likely associated with early extra mesorectal lymph node involvement.   Distance from tumor to the internal anal sphincter is 1.2 cm.   Also with extramural venous involvement as described.   08/29/2021 Cancer Staging   Staging form: Colon and Rectum, AJCC 8th Edition - Clinical stage from 08/29/2021: [name], cM1 - Signed by Malachy Mood, MD on 08/29/2021 Stage  prefix: Initial diagnosis   01/23/2022 Imaging   CT CAP w contrast IMPRESSION: 1. Mild interval decrease in size of multiple right-sided pulmonary nodules. No new suspicious pulmonary nodule or mass. 2. Interval development of approximately 10 new ill-defined hypoattenuating liver lesions ranging in size from approximately 5-10 mm. Imaging features suspicious for metastatic disease. MRI abdomen with and without contrast may prove helpful to further evaluate. 3. Similar appearance of wall thickening in the rectum with perirectal edema. 4. Cholelithiasis. 5. Prostatomegaly. 6. Aortic Atherosclerosis (ICD10-I70.0).   11/21/2022 Imaging    IMPRESSION: 1. Stable exam. No new or progressive interval findings. 2. The primary rectosigmoid lesion described previously is not well seen on the current study. There is presacral and perirectal edema, likely treatment related. 3. No substantial change in appearance of bilateral pulmonary metastases. 4. Stable tiny hypodensities in both hepatic lobes. These were previously characterized as metastatic disease. No new liver lesions on today's exam. 5. Cholelithiasis. 6. 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  History of  Present Illness The patient, with a history of metastatic colon cancer, presents for a follow-up visit. He reports feeling "okay" and has a good appetite, eating a variety of foods including pineapple upside down cake and fish. He has been taking Lonsurf for his cancer treatment and reports no issues with the medication. The last dose was taken three days ago, and he is on a regimen of one week on, one week off for two weeks. He also reports taking Lomotil for diarrhea, but not regularly, only as needed based on how he feels. He has been experiencing no significant stomach issues, although he notes that certain food combinations, such as sugar and hot sauce, do not sit well with him. He is aware that he needs to be careful with his diet due to his chemotherapy treatment.     All other systems were reviewed with the patient and are negative.  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    SURGICAL HISTORY: Past Surgical History:  Procedure Laterality Date   BRONCHIAL BIOPSY  09/27/2021   Procedure: BRONCHIAL BIOPSIES;  Surgeon: Josephine Igo, DO;  Location: [facility];  Service: Pulmonary;;   BRONCHIAL BRUSHINGS  09/27/2021   Procedure: BRONCHIAL BRUSHINGS;  Surgeon: Josephine Igo, DO;  Location: [facility];  Service: Pulmonary;;   BRONCHIAL NEEDLE ASPIRATION BIOPSY  09/27/2021   Procedure: BRONCHIAL NEEDLE ASPIRATION BIOPSIES;  Surgeon: Josephine Igo, DO;  Location: [facility];  Service: Pulmonary;;   COLONOSCOPY     IR IMAGING GUIDED PORT INSERTION  10/24/2021   left breast surgery Left 1968   VIDEO BRONCHOSCOPY WITH RADIAL ENDOBRONCHIAL ULTRASOUND  09/27/2021   Procedure: RADIAL ENDOBRONCHIAL ULTRASOUND;  Surgeon: Josephine Igo, DO;  Location: [facility];  Service: Pulmonary;;    I have reviewed the social history and family history with the patient and they are  unchanged from previous note.  ALLERGIES:  has no known allergies.  MEDICATIONS:  Current Outpatient Medications  Medication Sig Dispense Refill   amLODipine (NORVASC) 10 MG tablet Take 1 tablet (10 mg total) by mouth daily. 90 tablet 1   diphenoxylate-atropine (LOMOTIL) 2.5-0.025 MG tablet Take 1-2 tablets by mouth 4 (four) times daily as needed for diarrhea or loose stools. 90 tablet 1   gabapentin (NEURONTIN) 100 MG capsule Take 2 capsules (200 mg total) by mouth 3 (three) times daily. 180 capsule 2   LONSURF 20-8.19 MG tablet TAKE THREE 20MG  TABLETS FOR A TOTAL DOSE OF (60MG ) BY MOUTH TWICE DAILY ON DAYS 1-5 AND DAYS 8-12 OF EACH 28 DAY CYCLE. 60 tablet 8   metoprolol tartrate (LOPRESSOR) 25 MG tablet Take 1 tablet (25 mg total) by mouth 2 (two) times daily. 60 tablet 0   Multiple Vitamin (MULTIVITAMIN WITH MINERALS) TABS tablet Take 1 tablet by mouth daily.     ondansetron (ZOFRAN) 8 MG tablet Take 1 tablet (8 mg total) by mouth every 8 (eight) hours as needed for nausea or vomiting. 30 tablet 3   oxyCODONE (OXY IR/ROXICODONE) 5 MG immediate release tablet Take 1 tablet (5 mg total) by mouth every 6 (six) hours as needed for severe pain (pain score 7-10) or breakthrough pain. 60 tablet 0   pantoprazole (PROTONIX) 40 MG tablet Take 1 tablet (40 mg total)  by mouth daily before breakfast. 90 tablet 0   prochlorperazine (COMPAZINE) 10 MG tablet Take 1 tablet (10 mg total) by mouth every 6 (six) hours as needed (Nausea or vomiting). 30 tablet 1   Pseudoephedrine-APAP-DM (DAYQUIL PO) Take 1 tablet by mouth as needed.     No current facility-administered medications for this visit.    PHYSICAL EXAMINATION: ECOG PERFORMANCE STATUS: 1 - Symptomatic but completely ambulatory  Vitals:   02/14/24 1334  BP: 138/68  Pulse: 78  Resp: 17  Temp: 97.8 F (36.6 C)  SpO2: 98%   Wt Readings from Last 3 Encounters:  02/14/24 148 lb 8 oz (67.4 kg)  01/31/24 148 lb 4.8 oz (67.3 kg)  01/17/24 146 lb  11.2 oz (66.5 kg)     GENERAL:alert, no distress and comfortable SKIN: skin color, texture, turgor are normal, no rashes or significant lesions EYES: normal, Conjunctiva are pink and non-injected, sclera clear NECK: supple, thyroid normal size, non-tender, without nodularity LYMPH:  no palpable lymphadenopathy in the cervical, axillary  LUNGS: clear to auscultation and percussion with normal breathing effort HEART: regular rate & rhythm and no murmurs and no lower extremity edema ABDOMEN:abdomen soft, non-tender and normal bowel sounds Musculoskeletal:no cyanosis of digits and no clubbing  NEURO: alert & oriented x 3 with fluent speech, no focal motor/sensory deficits  Physical Exam    LABORATORY DATA:  I have reviewed the data as listed    Latest Ref Rng & Units 02/14/2024   12:32 PM 01/31/2024   11:56 AM 01/17/2024   12:59 PM  CBC  WBC 4.0 - 10.5 K/uL 3.8  2.7  2.9   Hemoglobin 13.0 - 17.0 g/dL 16.1  09.6  04.5   Hematocrit 39.0 - 52.0 % 42.0  40.4  40.6   Platelets 150 - 400 K/uL 165  141  174         Latest Ref Rng & Units 02/14/2024   12:32 PM 01/31/2024   11:56 AM 01/17/2024   12:59 PM  CMP  Glucose 70 - 99 mg/dL 409  811  914   BUN 8 - 23 mg/dL 8  13  14    Creatinine 0.61 - 1.24 mg/dL 7.82  9.56  2.13   Sodium 135 - 145 mmol/L 138  137  136   Potassium 3.5 - 5.1 mmol/L 4.0  3.8  3.9   Chloride 98 - 111 mmol/L 103  104  102   CO2 22 - 32 mmol/L 32  31  31   Calcium 8.9 - 10.3 mg/dL 9.0  8.5  8.5   Total Protein 6.5 - 8.1 g/dL 7.2  6.8  7.0   Total Bilirubin 0.0 - 1.2 mg/dL 0.6  0.5  0.6   Alkaline Phos 38 - 126 U/L 97  104  91   AST 15 - 41 U/L 21  18  21    ALT 0 - 44 U/L 12  11  12        RADIOGRAPHIC STUDIES: I have personally reviewed the radiological images as listed and agreed with the findings in the report. No results found.    No orders of the defined types were placed in this encounter.  All questions were answered. The patient knows to call the  clinic with any problems, questions or concerns. No barriers to learning was detected. The total time spent in the appointment was 25 minutes.     Malachy Mood, MD 02/14/2024

## 2024-02-14 NOTE — Progress Notes (Signed)
 Palliative Medicine [facility]  Telephone:(336) [phone] Fax:(336) [phone]   Name: Jorge [name] Date: 02/14/2024 MRN: [id number]  DOB: 15-Mar-1951  Patient Care Team: Ellender Hose, NP as PCP - General (Family Medicine) Croitoru, Rachelle Hora, MD as PCP - Cardiology (Cardiology) Pickenpack-Cousar, Arty Baumgartner, NP as Nurse Practitioner (Nurse Practitioner) Malachy Mood, MD as Consulting Physician (Hematology) Bridgett Larsson, LCSW as VBCI Care Management (Licensed Clinical Social Worker)   INTERVAL HISTORY: Jorge [name] is a 73 y.o. 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  Palliative ask to see for symptom management and goals of care.   SOCIAL HISTORY:     reports that he quit smoking about 4 months ago. His smoking use included cigarettes. He has a 5 pack-year smoking history. He has never used smokeless tobacco. He reports that he does not currently use alcohol. He reports that he does not use drugs.  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  MEDICATIONS:  Current Outpatient Medications  Medication Sig Dispense Refill   amLODipine (NORVASC) 10 MG tablet Take 1 tablet (10 mg total) by mouth daily. 90 tablet 1   diphenoxylate-atropine (LOMOTIL) 2.5-0.025 MG tablet Take 1-2 tablets by mouth 4 (four) times daily as needed for diarrhea or loose stools. 90 tablet 1   gabapentin (NEURONTIN) 100 MG capsule Take 2 capsules (200 mg total) by mouth 3 (three) times daily. 180 capsule 2   LONSURF 20-8.19 MG tablet TAKE THREE 20MG  TABLETS FOR A TOTAL DOSE OF (60MG ) BY MOUTH TWICE DAILY ON DAYS 1-5 AND DAYS 8-12 OF EACH 28 DAY CYCLE. 60 tablet 8   metoprolol tartrate (LOPRESSOR) 25 MG tablet Take 1 tablet (25 mg  total) by mouth 2 (two) times daily. 60 tablet 0   Multiple Vitamin (MULTIVITAMIN WITH MINERALS) TABS tablet Take 1 tablet by mouth daily.     ondansetron (ZOFRAN) 8 MG tablet Take 1 tablet (8 mg total) by mouth every 8 (eight) hours as needed for nausea or vomiting. 30 tablet 3   oxyCODONE (OXY IR/ROXICODONE) 5 MG immediate release tablet Take 1 tablet (5 mg total) by mouth every 6 (six) hours as needed for severe pain (pain score 7-10) or breakthrough pain. 60 tablet 0   pantoprazole (PROTONIX) 40 MG tablet Take 1 tablet (40 mg total) by mouth daily before breakfast. 90 tablet 0   prochlorperazine (COMPAZINE) 10 MG tablet Take 1 tablet (10 mg total) by mouth every 6 (six) hours as needed (Nausea or vomiting). 30 tablet 1   Pseudoephedrine-APAP-DM (DAYQUIL PO) Take 1 tablet by mouth as needed.     No current facility-administered medications for this visit.    VITAL SIGNS: There were no vitals taken for this visit. There were no vitals filed for this visit.   Estimated body mass index is 20.14 kg/m as calculated from the following:   Height as of 12/05/23: 6' (1.829 m).   Weight as of an earlier encounter on 02/14/24: 148 lb 8 oz (67.4 kg).   PERFORMANCE STATUS (ECOG) : 1 - Symptomatic but completely ambulatory  IMPRESSION:  Jorge [name] presents to clinic today for symptom management follow-up.  No acute distress noted.  Continues to remain as active as possible.  Shares his  love for his family and grandchildren with appreciation of his ability to continue to support them when needed.  Occasional fatigue.  Reports his appetite fluctuates.  Some days are better than others but feels that it has much improved.  Current weight is 148 pounds up from 146 pounds on March 13.  Denies concerns with nausea, vomiting, or constipation.  Loose stools currently managed with Lomotil as needed.  We discussed his pain at length.  [name] reports pain is well-controlled on current regimen which consist of  gabapentin 200 mg 3 times daily and oxycodone 5 mg every 6 hours as needed.  No adjustments to regimen at this time.  Will continue to closely monitor.  All questions answered and support provided.   PLAN:  Continue Gabapentin 200mg  three times daily.   Oxy IR 5mg  every 4-6 hours as needed for severe pain over the next 7 days. Will send in refill as appropriate. I will plan to see patient back in 4-6 weeks in collaboration with his oncology appointments.   Patient expressed understanding and was in agreement with this plan. He also understands that He can call the clinic at any time with any questions, concerns, or complaints.     Any controlled substances utilized were prescribed in the context of palliative care. PDMP has been reviewed.    Visit consisted of counseling and education dealing with the complex and emotionally intense issues of symptom management and palliative care in the setting of serious and potentially life-threatening illness.  Willette Alma, AGPCNP-BC  Palliative Medicine Team/[facility]

## 2024-02-15 ENCOUNTER — Ambulatory Visit: Payer: Self-pay | Admitting: Licensed Clinical Social Worker

## 2024-02-15 NOTE — Patient Instructions (Signed)
 Visit Information  Thank you for taking time to visit with me today. Please don't hesitate to contact me if I can be of assistance to you before our next scheduled appointment.  Our next appointment is by telephone on 06/06 at 10 AM Please call the care guide team at [phone] if you need to cancel or reschedule your appointment.   Following is a copy of your care plan:   Goals Addressed             This Visit's Progress    LCSW VBCI Social Work Care Plan   On track    Problems:   Disease Management support and education needs related to Caregiver Stress  CSW Clinical Goal(s):   Over the next 90 days the Patient will attend all scheduled medical appointments as evidenced by patient report and care team review of appointment completion in EMR:   demonstrate improved health management independence as evidenced bypatient and family report explore community resource options for unmet needs related to Social Connections.  Interventions:  Level of Care Concerns in a patient with  Cancer Current level of care: Home with other family or significant other(s): family member: Adult daughter, [name]  Evaluation of patient's unmet needs in current living environment ADL's Assessed needs, level of care concerns, how currently meeting needs and barriers to care  Facility  Patient is not in need of placement at this time  Advance Directive  Active listening / Reflection utilized Caregiver stress acknowledged :Caregiver reports she and pt is doing well Emotional Support Provided Solution-Focued Strategies employed:  Patient Goals/Self-Care Activities:  Call Cancer Center to follow up on caregiver resources and support. Continue taking your medication as prescribed.   Increase coping skills and healthy habits  Plan:   Telephone follow up appointment with care management team member scheduled for:  6-8 weeks     COMPLETED: Obtain Supportive Resources-LCSW       Activities and  task to complete in order to accomplish goals.   Keep all upcoming appointments discussed today Continue with compliance of taking medication prescribed by Doctor Implement healthy coping skills discussed to assist with management of symptoms Review supportive resources provided via email         Please call the Suicide and Crisis Lifeline: 988 call 911 if you are experiencing a Mental Health or Behavioral Health Crisis or need someone to talk to.  Patient verbalizes understanding of instructions and care plan provided today and agrees to view in MyChart. Active MyChart status and patient understanding of how to access instructions and care plan via MyChart confirmed with patient.     Windy Fast [facility]  [facility], [facility] Clinical Social Worker Direct Dial: [phone]  Fax: [phone] Website: [email or web address] 10:35 AM

## 2024-02-15 NOTE — Patient Outreach (Signed)
 Complex Care Management   Visit Note  02/15/2024  Name:  Jorge [name] MRN: [id number] DOB: April 15, 1951  Situation: Referral received for Complex Care Management related to  Cancer  I obtained verbal consent from pt's daughter.  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  Medications Reviewed Today     Reviewed by Bridgett Larsson, LCSW (Social Worker) on 02/15/24 at 1034  Med List Status: <None>   Medication Order Taking? Sig Documenting Provider Last Dose Status Informant  amLODipine (NORVASC) 10 MG tablet [id number] Yes Take 1 tablet (10 mg total) by mouth daily. Malachy Mood, MD Taking Active   diphenoxylate-atropine (LOMOTIL) 2.5-0.025 MG tablet [id number] Yes Take 1-2 tablets by mouth 4 (four) times daily as needed for diarrhea or loose stools. Carlean Jews, NP Taking Active   gabapentin (NEURONTIN) 100 MG capsule [id number] Yes Take 2 capsules (200 mg total) by mouth 3 (three) times daily. Malachy Mood, MD Taking Active Family Member, Pharmacy Records  LONSURF 20-8.19 MG tablet [id number] Yes TAKE THREE 20MG  TABLETS FOR A TOTAL DOSE OF (60MG ) BY MOUTH TWICE DAILY ON DAYS 1-5 AND DAYS 8-12 OF EACH 28 DAY CYCLE. Malachy Mood, MD Taking Active   metoprolol tartrate (LOPRESSOR) 25 MG tablet  [id number] Yes Take 1 tablet (25 mg total) by mouth 2 (two) times daily. Miguel Rota, MD Taking Active   Multiple Vitamin (MULTIVITAMIN WITH MINERALS) TABS tablet [id number] Yes Take 1 tablet by mouth daily. [provider] Taking Active Family Member, Pharmacy Records    Discontinued 09/16/22 0914          Med Note Alita Chyle   Mon Oct 24, 2021  1:12 PM) Not started  ondansetron (ZOFRAN) 8 MG tablet [id number] Yes Take 1 tablet (8 mg total) by mouth every 8 (eight) hours as needed for nausea or vomiting. Malachy Mood, MD Taking Active Family Member, Pharmacy Records  oxyCODONE (OXY IR/ROXICODONE) 5 MG immediate release tablet [id number] Yes Take 1 tablet (5 mg total) by mouth every 6 (six) hours as needed for severe pain (pain score 7-10) or breakthrough pain. Pickenpack-Cousar, Arty Baumgartner, NP Taking Active   pantoprazole (PROTONIX) 40 MG tablet [id number] Yes Take 1 tablet (40 mg total) by mouth daily before breakfast. Nelson Chimes, Ankit C, MD Taking Active   prochlorperazine (COMPAZINE) 10 MG tablet [id number] Yes Take 1 tablet (10 mg total) by mouth every 6 (six) hours as needed (Nausea or vomiting). Malachy Mood, MD Taking Active Family Member, Pharmacy Records  Pseudoephedrine-APAP-DM [facility] PO) [id number] Yes Take 1 tablet by mouth as needed. [provider] Taking Active Family Member, Pharmacy Records  Med List Note Brunetta Jeans, [facility] 12/19/23 7628): [place] filled through [facility]  Recommendation:   Continue utilizing strategies discussed to assist with symptom management  Follow Up Plan:   Telephone follow-up 06/06  Jenel Lucks, LCSW [place]  [facility], [facility] Clinical Social Worker Direct Dial: [phone]  Fax: [phone] Website: [email or web address] 10:35 AM

## 2024-02-19 ENCOUNTER — Other Ambulatory Visit: Payer: Self-pay | Admitting: Nurse Practitioner

## 2024-02-19 DIAGNOSIS — Z515 Encounter for palliative care: Secondary | ICD-10-CM

## 2024-02-19 DIAGNOSIS — G893 Neoplasm related pain (acute) (chronic): Secondary | ICD-10-CM

## 2024-02-19 DIAGNOSIS — C20 Malignant neoplasm of rectum: Secondary | ICD-10-CM

## 2024-02-19 MED ORDER — OXYCODONE HCL 5 MG PO TABS
5.0000 mg | ORAL_TABLET | Freq: Four times a day (QID) | ORAL | 0 refills | Status: DC | PRN
Start: 2024-02-21 — End: 2024-03-03

## 2024-02-25 ENCOUNTER — Encounter (HOSPITAL_COMMUNITY): Payer: Self-pay

## 2024-02-25 ENCOUNTER — Ambulatory Visit (HOSPITAL_COMMUNITY)
Admission: RE | Admit: 2024-02-25 | Discharge: 2024-02-25 | Disposition: A | Discharge status: Home / Self Care | Source: Ambulatory Visit | Attending: Hematology | Admitting: Hematology

## 2024-02-25 DIAGNOSIS — C7800 Secondary malignant neoplasm of unspecified lung: Secondary | ICD-10-CM | POA: Insufficient documentation

## 2024-02-25 DIAGNOSIS — C20 Malignant neoplasm of rectum: Secondary | ICD-10-CM | POA: Diagnosis present

## 2024-02-25 MED ORDER — IOHEXOL 300 MG/ML  SOLN
100.0000 mL | Freq: Once | INTRAMUSCULAR | Status: AC | PRN
  Administered 2024-02-25: 100 mL via INTRAVENOUS

## 2024-02-25 MED ORDER — IOHEXOL 9 MG/ML PO SOLN
1000.0000 mL | Freq: Once | ORAL | Status: AC
  Administered 2024-02-25: 1000 mL via ORAL

## 2024-02-25 MED ORDER — SODIUM CHLORIDE (PF) 0.9 % IJ SOLN
INTRAMUSCULAR | Status: AC
Start: 2024-02-25 — End: ?
  Filled 2024-02-25: qty 50

## 2024-02-25 MED ORDER — IOHEXOL 9 MG/ML PO SOLN
ORAL | Status: AC
  Filled 2024-02-25: qty 1000

## 2024-02-25 MED ORDER — HEPARIN SOD (PORK) LOCK FLUSH 100 UNIT/ML IV SOLN
500.0000 [IU] | Freq: Once | INTRAVENOUS | Status: AC
  Administered 2024-02-25: 500 [IU] via INTRAVENOUS

## 2024-02-25 MED ORDER — HEPARIN SOD (PORK) LOCK FLUSH 100 UNIT/ML IV SOLN
INTRAVENOUS | Status: AC
Start: 2024-02-25 — End: ?
  Filled 2024-02-25: qty 5

## 2024-02-28 ENCOUNTER — Encounter: Payer: Self-pay | Admitting: Nurse Practitioner

## 2024-02-28 ENCOUNTER — Other Ambulatory Visit (HOSPITAL_COMMUNITY): Payer: Self-pay

## 2024-02-28 ENCOUNTER — Inpatient Hospital Stay (HOSPITAL_BASED_OUTPATIENT_CLINIC_OR_DEPARTMENT_OTHER): Payer: Medicare Other | Admitting: Nurse Practitioner

## 2024-02-28 ENCOUNTER — Inpatient Hospital Stay: Payer: Medicare Other

## 2024-02-28 VITALS — BP 136/80 | HR 76 | Temp 97.5°F | Resp 17 | Wt 146.5 lb

## 2024-02-28 DIAGNOSIS — Z95828 Presence of other vascular implants and grafts: Secondary | ICD-10-CM

## 2024-02-28 DIAGNOSIS — C20 Malignant neoplasm of rectum: Secondary | ICD-10-CM

## 2024-02-28 LAB — CMP (CANCER CENTER ONLY)
ALT: 12 U/L (ref 0–44)
AST: 21 U/L (ref 15–41)
Albumin: 3.5 g/dL (ref 3.5–5.0)
Alkaline Phosphatase: 111 U/L (ref 38–126)
Anion gap: 3 — ABNORMAL LOW (ref 5–15)
BUN: 10 mg/dL (ref 8–23)
CO2: 31 mmol/L (ref 22–32)
Calcium: 8.8 mg/dL — ABNORMAL LOW (ref 8.9–10.3)
Chloride: 104 mmol/L (ref 98–111)
Creatinine: 0.72 mg/dL (ref 0.61–1.24)
GFR, Estimated: >60 mL/min (ref >60)
Glucose, Bld: 108 mg/dL — ABNORMAL HIGH (ref 70–99)
Potassium: 4.1 mmol/L (ref 3.5–5.1)
Sodium: 138 mmol/L (ref 135–145)
Total Bilirubin: 0.4 mg/dL (ref 0.0–1.2)
Total Protein: 6.9 g/dL (ref 6.5–8.1)

## 2024-02-28 LAB — CBC WITH DIFFERENTIAL (CANCER CENTER ONLY)
Abs Immature Granulocytes: 0.01 10*3/uL (ref 0.00–0.07)
Basophils Absolute: 0 10*3/uL (ref 0.0–0.1)
Basophils Relative: 0 %
Eosinophils Absolute: 0 10*3/uL (ref 0.0–0.5)
Eosinophils Relative: 1 %
HCT: 39.2 % (ref 39.0–52.0)
Hemoglobin: 13.1 g/dL (ref 13.0–17.0)
Immature Granulocytes: 0 %
Lymphocytes Relative: 39 %
Lymphs Abs: 0.9 10*3/uL (ref 0.7–4.0)
MCH: 30.8 pg (ref 26.0–34.0)
MCHC: 33.4 g/dL (ref 30.0–36.0)
MCV: 92 fL (ref 80.0–100.0)
Monocytes Absolute: 0.2 10*3/uL (ref 0.1–1.0)
Monocytes Relative: 7 %
Neutro Abs: 1.2 10*3/uL — ABNORMAL LOW (ref 1.7–7.7)
Neutrophils Relative %: 53 %
Platelet Count: 160 10*3/uL (ref 150–400)
RBC: 4.26 MIL/uL (ref 4.22–5.81)
RDW: 16.7 % — ABNORMAL HIGH (ref 11.5–15.5)
WBC Count: 2.3 10*3/uL — ABNORMAL LOW (ref 4.0–10.5)
nRBC: 0 % (ref 0.0–0.2)

## 2024-02-28 LAB — TOTAL PROTEIN, URINE DIPSTICK: Protein, ur: 300 mg/dL — AB

## 2024-02-28 MED ORDER — SODIUM CHLORIDE 0.9% FLUSH
10.0000 mL | Freq: Once | INTRAVENOUS | Status: AC
  Administered 2024-02-28: 10 mL

## 2024-02-28 MED ORDER — HEPARIN SOD (PORK) LOCK FLUSH 100 UNIT/ML IV SOLN: 500.0000 [IU] | Freq: Once | INTRAVENOUS | Status: DC

## 2024-02-28 MED ORDER — SODIUM CHLORIDE 0.9% FLUSH: 10.0000 mL | Freq: Once | INTRAVENOUS | Status: DC

## 2024-02-28 NOTE — Progress Notes (Signed)
 Per Rande Bushy, NP, no Tx today.

## 2024-02-28 NOTE — Progress Notes (Cosign Needed)
 Patient Care Team: Melodie Spry, NP as PCP - General (Family Medicine) Croitoru, Karyl Paget, MD as PCP - Cardiology (Cardiology) Pickenpack-Cousar, Giles Labrum, NP as Nurse Practitioner (Nurse Practitioner) Sonja Floyd, MD as Consulting Physician (Hematology) Adriana Albany, LCSW as VBCI Care Management (Licensed Clinical Social Worker) Adriana Albany, LCSW (Licensed Clinical Social Worker)  Clinic Day:  02/28/2024  Referring physician: Sonja , MD  ASSESSMENT & PLAN:   Assessment & Plan: Rectal cancer [facility] [phone] with lung metastasis, MMR normal, KRAS(+) [id number]  -diagnosed 08/2021 by colonoscopy for rectal bleeding and frequent BM. Staging MRI showed early extra mesorectal lymph node involvement.  -baseline CEA 571.84 on 09/06/21.  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 Xeloda  dose reduced due to elevated liver enzymes.  -due to new liver lesions, we switched to second-line FOLFIRI, with continued beva, on 02/21/22. He tolerates well overall.  - restaging CT scan on April 22 and June 26 showed stable disease overall. -CT 10/09/2023 showed mild disease progression in lungs, his tumor marker CEA has been going up also.  --His chemo was changed to lonsurf  and beva, he started on 12/13/2023 - CT from 02/25/2024 showed mixed response to current treatment.  There is interval enlargement of pulmonary nodules.  Decrease in size of mediastinal and hilar nodal metastases.  Unchanged rectal wall thickening with stranding. -Bevacizumab  held on 02/28/2024 due to urine protein >300. -Continue Lonsurf  with current regimen for 14 days on treatment followed by 14 days off treatment. -Plan to get foundation 1 testing and adjust chemotherapy/immunotherapy based on results of new foundation 1 testing. -Will see patient back in 2 weeks to discuss recommended treatment  changes.  Metastatic rectal cancer  CT scan done 02/19/2024 showing response to current treatment with Lonsurf  and bevacizumab .  Interval increase in size of lung metastases.  Improved mediastinal and right hilar nodal metastases.  Unchanged wall thickening along the rectum with stranding.  Discussed mixed results with patient.  Plan to get foundation 1 testing to evaluate for targetable genetic mutations of current tumor.  For now, continue with Lonsurf  as prescribed.  Will adjust treatment based on results of foundation 1 testing.  Proteinuria Urine protein >300 today.  BUN and serum creatinine are normal.  Will hold bevacizumab  today due to proteinuria.  Reassess in 2 weeks with likely change in treatment.  Peripheral neuropathy due to chemotherapy Gabapentin  only partially effective.  Continues to have cold and numb sensation in both feet.  Currently taking 200 mg gabapentin  3 times daily.  Will increase to 300 mg 3 times daily.  If more effective, will change prescription at next visit.  Diarrhea related to chemotherapy/immunotherapy Continue OTC Imodium as needed.  Okay to continue using prescription Lomotil  when Imodium is ineffective.  Advised him to stay hydrated.  Plan The patient was seen along with Dr. Maryalice Smaller. Reviewed labs. - Leukopenia with WBC 2.3 and ANC 1.2. -Proteinuria with >300 protein in urine. Immunotherapy, bevacizumab , held today. Reviewed CT scan showing mixed response to treatment with Lonsurf  and bevacizumab .  Increased size of pulmonary nodules with decreased size mediastinal and hilar nodal metastases.  Unchanged rectal wall thickening with stranding. -Will get foundation 1 testing.  Plan to adjust chemotherapy/immunotherapy based on results of foundation 1 testing. -For now, continue Lonsurf  with 14 days of treatment followed by 14 days off treatment.  Plan to see back in 2 weeks to discuss change  in treatment.  The patient understands the plans discussed today and  is in agreement with them.  He knows to contact our office if he develops concerns prior to his next appointment.  I provided 25 minutes of face-to-face time during this encounter and > 50% was spent counseling as documented under my assessment and plan.    Sharyon Deis, NP  [facility] - A DEPT OF [facility] [address] [place] [address] Dept: [phone] Dept Fax: [phone]   No orders of the defined types were placed in this encounter.     CHIEF COMPLAINT:  CC: Follow-up rectal cancer  Current Treatment: Lonsurf  and bevacizumab   INTERVAL HISTORY:  Jorge [name] is here today for repeat clinical assessment.  He was last seen by Dr. Maryalice Smaller on 02/14/2024.  Started Lonsurf  and bevacizumab  on 12/13/2023.  He takes Lonsurf  on a schedule of 2 weeks on followed by 2 weeks off.  CEA levels were going up.  Patient had CT chest CAP on 02/25/2024.  Overall, CT scan showing a mixed response to therapy.  Multiple lung nodules are noted which have increased in size from previous scans.  Nodal metastases and the mediastinum and right lung hilum are improving.  There are low attenuating liver lesions which are slightly larger but lower intensity.  There is persistent wall thickening and nodularity along the rectum with adjacent stranding and fluid. The patient reports well managed diarrhea with use of OTC imodium and prescribed lomotil  as needed. He denies chest pain, chest pressure, or shortness of breath. He denies headaches or visual disturbances. He denies abdominal pain, nausea, vomiting, or changes in bowel or bladder habits.   He denies fevers or chills. He denies pain. His appetite is good. His weight has decreased 2 pounds over last 3 weeks .  I have reviewed the past medical history, past surgical history, social history and family history with the patient and they are unchanged from previous note.  ALLERGIES:  has no known  allergies.  MEDICATIONS:  Current Outpatient Medications  Medication Sig Dispense Refill   amLODipine  (NORVASC ) 10 MG tablet Take 1 tablet (10 mg total) by mouth daily. 90 tablet 1   diphenoxylate -atropine  (LOMOTIL ) 2.5-0.025 MG tablet Take 1-2 tablets by mouth 4 (four) times daily as needed for diarrhea or loose stools. 90 tablet 1   gabapentin  (NEURONTIN ) 100 MG capsule Take 2 capsules (200 mg total) by mouth 3 (three) times daily. 180 capsule 2   LONSURF  20-8.19 MG tablet TAKE THREE 20MG  TABLETS FOR A TOTAL DOSE OF (60MG ) BY MOUTH TWICE DAILY ON DAYS 1-5 AND DAYS 8-12 OF EACH 28 DAY CYCLE. 60 tablet 8   metoprolol  tartrate (LOPRESSOR ) 25 MG tablet Take 1 tablet (25 mg total) by mouth 2 (two) times daily. 60 tablet 0   Multiple Vitamin (MULTIVITAMIN WITH MINERALS) TABS tablet Take 1 tablet by mouth daily.     ondansetron  (ZOFRAN ) 8 MG tablet Take 1 tablet (8 mg total) by mouth every 8 (eight) hours as needed for nausea or vomiting. 30 tablet 3   oxyCODONE  (OXY IR/ROXICODONE ) 5 MG immediate release tablet Take 1 tablet (5 mg total) by mouth every 6 (six) hours as needed for severe pain (pain score 7-10) or breakthrough pain. 60 tablet 0   pantoprazole  (PROTONIX ) 40 MG tablet Take 1 tablet (40 mg total) by mouth daily before breakfast. 90 tablet 0   prochlorperazine  (COMPAZINE ) 10 MG tablet Take  1 tablet (10 mg total) by mouth every 6 (six) hours as needed (Nausea or vomiting). 30 tablet 1   Pseudoephedrine-APAP-DM (DAYQUIL PO) Take 1 tablet by mouth as needed.     No current facility-administered medications for this visit.   Facility-Administered Medications Ordered in Other Visits  Medication Dose Route Frequency Provider Last Rate Last Admin   heparin  lock flush 100 unit/mL  500 Units Intracatheter Once Sonja [place], MD       sodium chloride  flush (NS) 0.9 % injection 10 mL  10 mL Intracatheter Once Sonja [place], MD        HISTORY OF PRESENT ILLNESS:   Oncology History Overview Note  Cancer  Staging Rectal cancer [facility]) Staging form: Colon and Rectum, AJCC 8th Edition - Clinical stage from 08/29/2021: Latricia Poles, cM1 - Signed by Sonja Pisgah, MD on 08/29/2021    Rectal cancer (HCC)  07/23/2021 Imaging   CT AP  IMPRESSION: 1. Appearance of the rectum likely indicates a rectal mass suspicious for rectal carcinoma. Inflammatory process would be less likely. Direct visualization is suggested. 2. Cholelithiasis without evidence of acute cholecystitis. 3. Aortic atherosclerosis.   08/19/2021 Procedure   Colonoscopy, under Dr. Brice Campi  Impression: - Rectal tenderness, palpable rectal mass and hemorrhoids found on digital rectal exam. - Stool in the entire examined colon - lavaged with still inadequate clearance. - One 35 mm polyp at the hepatic flexure. Biopsied. Tattooed distal in case future endoscopic resection is considered - however, has larger issues with rectal mass currently as below. - Four, 5 to 11 mm polyps in the transverse colon and at the hepatic flexure, removed with a cold snare. Resected and retrieved. - Diverticulosis in the recto-sigmoid colon and in the sigmoid colon. - Rule out malignancy, partially obstructing tumor in the rectum and from 6 to 13 cm proximal to the anus. Biopsied.   08/19/2021 Pathology Results   Diagnosis 1. Hepatic Flexure Biopsy - TUBULAR ADENOMA WITHOUT HIGH-GRADE DYSPLASIA OR MALIGNANCY 2. Transverse Colon Biopsy, and hepatic flexure, polyps (4) - TUBULAR ADENOMA WITHOUT HIGH-GRADE DYSPLASIA OR MALIGNANCY - OTHER FRAGMENTS OF POLYPOID COLONIC MUCOSA WITH NO SPECIFIC HISTOPATHOLOGIC CHANGES - FOOD MATERIAL 3. Rectum, biopsy - ADENOCARCINOMA. SEE NOTE   08/22/2021 Initial Diagnosis   Rectal cancer (HCC)   08/26/2021 Imaging   IMPRESSION: 1. A 2.2 x 1.9 cm right middle lobe pulmonary nodule as well as a total of four right upper lobe pulmonary nodule and masses measuring up to 3.6 cm. Findings concerning for metastatic primary  lung cancer versus less likely metastases in a patient with rectal cancer. Additional imaging evaluation or consultation with Pulmonology or Thoracic Surgery recommended. 2. No gross hilar adenopathy, noting limited sensitivity for the detection of hilar adenopathy on this noncontrast study. 3. Cholelithiasis. 4.  Emphysema (ICD10-J43.9). 5. At least left anterior descending coronary artery calcifications.   08/27/2021 Imaging   IMPRESSION: Rectal adenocarcinoma T stage: T4 B   Rectal adenocarcinoma N stage: N2 disease likely associated with early extra mesorectal lymph node involvement.   Distance from tumor to the internal anal sphincter is 1.2 cm.   Also with extramural venous involvement as described.   08/29/2021 Cancer Staging   Staging form: Colon and Rectum, AJCC 8th Edition - Clinical stage from 08/29/2021: Latricia Poles, cM1 - Signed by Sonja Stillwater, MD on 08/29/2021 Stage prefix: Initial diagnosis   01/23/2022 Imaging   CT CAP w contrast IMPRESSION: 1. Mild interval decrease in size of multiple right-sided pulmonary nodules. No new suspicious pulmonary  nodule or mass. 2. Interval development of approximately 10 new ill-defined hypoattenuating liver lesions ranging in size from approximately 5-10 mm. Imaging features suspicious for metastatic disease. MRI abdomen with and without contrast may prove helpful to further evaluate. 3. Similar appearance of wall thickening in the rectum with perirectal edema. 4. Cholelithiasis. 5. Prostatomegaly. 6. Aortic Atherosclerosis (ICD10-I70.0).   11/21/2022 Imaging    IMPRESSION: 1. Stable exam. No new or progressive interval findings. 2. The primary rectosigmoid lesion described previously is not well seen on the current study. There is presacral and perirectal edema, likely treatment related. 3. No substantial change in appearance of bilateral pulmonary metastases. 4. Stable tiny hypodensities in both hepatic lobes. These  were previously characterized as metastatic disease. No new liver lesions on today's exam. 5. Cholelithiasis. 6. Emphysema (ICD10-J43.9) and Aortic Atherosclerosis (ICD10-170.0)     02/25/2024 Imaging   CT Chest, Abdomen, and Pelvis with contrast  IMPRESSION: Multiple lung nodules are again identified. These are increased in size from previous.   Previous nodes however in the mediastinum and right lung hilum are improving.   Fatty infiltration with multiple low-attenuation liver lesions. Many these are slightly larger but more low in density today. Dilated gallbladder with stones.   Persistent wall thickening and nodularity along the rectum with adjacent stranding and fluid. Please correlate with exact location of neoplasm.   Overall mixed response.       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)   10/14/2021 - 01/25/2022 Chemotherapy   Patient is on Treatment Plan : COLORECTAL CapeOx + Bevacizumab  q21d     02/21/2022 - 06/29/2022 Chemotherapy   Patient is on Treatment Plan : COLORECTAL FOLFIRI / BEVACIZUMAB  Q14D     02/21/2022 - 11/16/2023 Chemotherapy   Patient is on Treatment Plan : COLORECTAL FOLFIRI + Bevacizumab  q14d     12/13/2023 -  Chemotherapy   Patient is on Treatment Plan : COLORECTAL Bevacizumab  + Trifluridine /Tipiracil  q28d         REVIEW OF SYSTEMS:   Constitutional: Denies fevers, chills or abnormal weight loss Eyes: Denies blurriness of vision Ears, nose, mouth, throat, and face: Denies mucositis or sore throat Respiratory: Denies cough, dyspnea or wheezes Cardiovascular: Denies palpitation, chest discomfort or lower extremity swelling Gastrointestinal:  Denies nausea, heartburn or change in bowel habits Skin: Denies abnormal skin rashes Lymphatics: Denies new lymphadenopathy or easy bruising Neurological:Denies numbness, tingling or new weaknesses Behavioral/Psych: Mood is  stable, no new changes  All other systems were reviewed with the patient and are negative.   VITALS:   Today's Vitals   02/28/24 1317  BP: 136/80  Pulse: 76  Resp: 17  Temp: (!) 97.5 F (36.4 C)  SpO2: 97%  Weight: 146 lb 8 oz (66.5 kg)  PainSc: 0-No pain   Body mass index is 19.87 kg/m.   Wt Readings from Last 3 Encounters:  02/28/24 146 lb 8 oz (66.5 kg)  02/14/24 148 lb 8 oz (67.4 kg)  01/31/24 148 lb 4.8 oz (67.3 kg)    Body mass index is 19.87 kg/m.  Performance status (ECOG): 1 - Symptomatic but completely ambulatory  PHYSICAL EXAM:   GENERAL:alert, no distress and comfortable SKIN: skin color, texture, turgor are normal, no rashes or significant lesions EYES: normal, Conjunctiva are pink and non-injected, sclera clear OROPHARYNX:no exudate, no erythema and lips, buccal mucosa, and tongue normal  NECK: supple, thyroid normal size, non-tender, without nodularity LYMPH:  no palpable lymphadenopathy  in the cervical, axillary or inguinal LUNGS: clear to auscultation and percussion with normal breathing effort HEART: regular rate & rhythm and no murmurs and no lower extremity edema ABDOMEN:abdomen soft, non-tender and normal bowel sounds Musculoskeletal:no cyanosis of digits and no clubbing  NEURO: alert & oriented x 3 with fluent speech, no focal motor/sensory deficits  LABORATORY DATA:  I have reviewed the data as listed    Component Value Date/Time   NA 138 02/28/2024 1241   K 4.1 02/28/2024 1241   CL 104 02/28/2024 1241   CO2 31 02/28/2024 1241   GLUCOSE 108 (H) 02/28/2024 1241   BUN 10 02/28/2024 1241   CREATININE 0.72 02/28/2024 1241   CALCIUM  8.8 (L) 02/28/2024 1241   PROT 6.9 02/28/2024 1241   ALBUMIN 3.5 02/28/2024 1241   AST 21 02/28/2024 1241   ALT 12 02/28/2024 1241   ALKPHOS 111 02/28/2024 1241   BILITOT 0.4 02/28/2024 1241   GFRNONAA >60 02/28/2024 1241     Lab Results  Component Value Date   WBC 2.3 (L) 02/28/2024   NEUTROABS 1.2  (L) 02/28/2024   HGB 13.1 02/28/2024   HCT 39.2 02/28/2024   MCV 92.0 02/28/2024   PLT 160 02/28/2024    RADIOGRAPHIC STUDIES: CT CHEST ABDOMEN PELVIS W CONTRAST Result Date: 02/25/2024 CLINICAL DATA:  Rectal cancer. Assess treatment response. X-ray Ts features. Ongoing chemotherapy. * Tracking Code: BO *. EXAM: CT CHEST, ABDOMEN, AND PELVIS WITH CONTRAST TECHNIQUE: Multidetector CT imaging of the chest, abdomen and pelvis was performed following the standard protocol during bolus administration of intravenous contrast. RADIATION DOSE REDUCTION: This exam was performed according to the departmental dose-optimization program which includes automated exposure control, adjustment of the mA and/or kV according to patient size and/or use of iterative reconstruction technique. CONTRAST:  OMNIPAQUE  IOHEXOL  300 MG/ML  SOLN COMPARISON:  CTA 11/24/2023 and older chest CT. Abdomen pelvis CT 12/20/2023 and older. FINDINGS: CT CHEST FINDINGS Cardiovascular: Thoracic aorta is normal caliber with slight atherosclerotic disease. Bovine type aortic arch, a normal variant. Heart is nonenlarged. No significant pericardial effusion. Coronary calcifications are seen. Right IJ chest port in place. The visualized portions of the internal jugular vein on the right are atretic. Unchanged from previous. Tip of the catheter extends to the SVC right atrial junction. Mediastinum/Nodes: Patulous esophagus with some wall thickening. Please correlate with any symptoms. Small thyroid gland. No specific abnormal lymph node enlargement identified in the axillary regions. Right hilar lymph node is improving. Previously short axis dimension of 19 mm. Today short axis transverse dimension of 14 mm on series 2 image 33. No new hilar nodes bilaterally. Mediastinal nodes are also smaller. The subcarinal node which previously measured 11 mm in short axis, today on series 2 image 33 measures 5 mm. Paratracheal nodes are smaller. No new  mediastinal nodal enlargement. Lungs/Pleura: Emphysematous lung changes are identified. No consolidation, pneumothorax or effusion. Dominant lung nodules are once again seen. For example lesion anterior right upper lobe which previously measured 2.4 by 2.0 cm, today on on series 6, image 66 measures 3.0 by 3.0 cm. Mass in the anterior inferior right upper lobe which previously measured 2.8 by 1.8 cm, today measures 3.4 by 2.6 cm on image 91 of series 6. Middle lobe mass which abuts the pleura anteriorly and on the prior measured approximately 2.9 x 1.9 cm, today measures 3.2 by 3.0 cm on image 108 of series 6. Small left upper lobe nodule also larger. On image 62 today this measures 7  mm in AP dimension and previously 5 mm. New small area as well as superior segment left lower lobe on image 91. On the prior CT scan the abdomen and pelvis there are some reticulonodular changes in the right lower lobe which have shown some improvement. Musculoskeletal: Scattered degenerative changes along the spine. Curvature of the spine as well. CT ABDOMEN PELVIS FINDINGS Hepatobiliary: Fatty liver infiltration. Slight nodular contour to liver. Low-attenuation lesion seen previously along segment 5 liver medially that measured 10 mm, today is larger but lower in density measuring 15 mm on image 79 of series 2. Small focus in segment 3 which measured 9 mm previously, today on image 72 of series 2 measures 11 mm. Few other tiny low-density foci elsewhere. Again slightly larger but lower in density. Patent portal vein. Stones dependently in the distended gallbladder. Please correlate with symptoms. Pancreas: Unremarkable. No pancreatic ductal dilatation or surrounding inflammatory changes. Spleen: Normal in size without focal abnormality. Adrenals/Urinary Tract: Adrenal glands are unremarkable. Kidneys are normal, without renal calculi, focal lesion, or hydronephrosis. Bladder is unremarkable. Stomach/Bowel: Stomach is underdistended.  Borderline fold thickening. There is oral contrast in the small bowel. Small bowel is nondilated. The large bowel has a normal course and caliber. Diffuse colonic stool. Wall thickening identified along the rectosigmoid colon with stranding. Persistent fluid in the presacral space and stranding surrounding the rectum. Thickening of the mesorectal fascia. Please correlate with exact location of neoplasm. Vascular/Lymphatic: Aortic atherosclerosis. No enlarged abdominal or pelvic lymph nodes. Reproductive: Enlarged prostate with mass effect along the base of the bladder. Other: No abdominal wall hernia or abnormality. No abdominopelvic ascites. Musculoskeletal: Curvature of the spine. Scattered degenerative changes of the spine and pelvis. IMPRESSION: Multiple lung nodules are again identified. These are increased in size from previous. Previous nodes however in the mediastinum and right lung hilum are improving. Fatty infiltration with multiple low-attenuation liver lesions. Many these are slightly larger but more low in density today. Dilated gallbladder with stones. Persistent wall thickening and nodularity along the rectum with adjacent stranding and fluid. Please correlate with exact location of neoplasm. Overall mixed response. Electronically Signed   By: Adrianna Horde M.D.   On: 02/25/2024 17:01    Addendum I have seen the patient, examined him. I agree with the assessment and and plan and have edited the notes.   [name] is clinically doing well, does have some intermittent cough.  I personally reviewed his restaging CT scan images with patient, which unfortunately shows moderate disease progression in his lung metastasis.  His urine protein is high, will hold Avastin  today.  Plan to change his treatment.  I recommend foundation 1 liquid biopsy, and I will also test HER2 fish on his previous biopsy, to see if he is a candidate for targeted therapy.  I plan to see him back in 2 weeks when the above  results are back.  All questions were answered.  Sonja Gratz MD, 02/28/2024

## 2024-02-28 NOTE — Assessment & Plan Note (Addendum)
 [id number] with lung metastasis, MMR normal, KRAS(+) [id number]  -diagnosed 08/2021 by colonoscopy for rectal bleeding and frequent BM. Staging MRI showed early extra mesorectal lymph node involvement.  -baseline CEA 571.84 on 09/06/21.  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 Xeloda  dose reduced due to elevated liver enzymes.  -due to new liver lesions, we switched to second-line FOLFIRI, with continued beva, on 02/21/22. He tolerates well overall.  - restaging CT scan on April 22 and June 26 showed stable disease overall. -CT 10/09/2023 showed mild disease progression in lungs, his tumor marker CEA has been going up also.  --His chemo was changed to lonsurf  and beva, he started on 12/13/2023 - CT from 02/25/2024 showed mixed response to current treatment.  There is interval enlargement of pulmonary nodules.  Decrease in size of mediastinal and hilar nodal metastases.  Unchanged rectal wall thickening with stranding. -Bevacizumab  held on 02/28/2024 due to urine protein >300. -Continue Lonsurf  with current regimen for 14 days on treatment followed by 14 days off treatment. -Plan to get foundation 1 testing and adjust chemotherapy/immunotherapy based on results of new foundation 1 testing. -Will see patient back in 2 weeks to discuss recommended treatment changes.

## 2024-02-29 ENCOUNTER — Encounter: Payer: Self-pay | Admitting: Hematology

## 2024-03-03 ENCOUNTER — Other Ambulatory Visit: Payer: Self-pay | Admitting: Nurse Practitioner

## 2024-03-03 DIAGNOSIS — Z515 Encounter for palliative care: Secondary | ICD-10-CM

## 2024-03-03 DIAGNOSIS — C20 Malignant neoplasm of rectum: Secondary | ICD-10-CM

## 2024-03-03 DIAGNOSIS — G893 Neoplasm related pain (acute) (chronic): Secondary | ICD-10-CM

## 2024-03-03 MED ORDER — OXYCODONE HCL 5 MG PO TABS: 5.0000 mg | ORAL_TABLET | Freq: Four times a day (QID) | ORAL | 0 refills | Status: DC | PRN

## 2024-03-04 ENCOUNTER — Other Ambulatory Visit: Payer: Self-pay

## 2024-03-04 DIAGNOSIS — C20 Malignant neoplasm of rectum: Secondary | ICD-10-CM

## 2024-03-04 DIAGNOSIS — C7800 Secondary malignant neoplasm of unspecified lung: Secondary | ICD-10-CM

## 2024-03-04 NOTE — Progress Notes (Signed)
 Foundation One Liquid CDX ordered per Dr. Maryalice Smaller.  Molecular lab will be drawn on 03/13/2024.  FO Liquid CDX kit given to Winda Hastings in lab.

## 2024-03-05 ENCOUNTER — Ambulatory Visit: Payer: Medicare Other | Admitting: Family Medicine

## 2024-03-13 ENCOUNTER — Telehealth: Payer: Self-pay

## 2024-03-13 ENCOUNTER — Inpatient Hospital Stay (HOSPITAL_BASED_OUTPATIENT_CLINIC_OR_DEPARTMENT_OTHER): Attending: Physician Assistant | Admitting: Nurse Practitioner

## 2024-03-13 ENCOUNTER — Inpatient Hospital Stay (HOSPITAL_BASED_OUTPATIENT_CLINIC_OR_DEPARTMENT_OTHER): Admitting: Nurse Practitioner

## 2024-03-13 ENCOUNTER — Other Ambulatory Visit (HOSPITAL_COMMUNITY): Payer: Self-pay

## 2024-03-13 ENCOUNTER — Encounter: Payer: Self-pay | Admitting: Nurse Practitioner

## 2024-03-13 ENCOUNTER — Telehealth: Payer: Self-pay | Admitting: Pharmacist

## 2024-03-13 ENCOUNTER — Ambulatory Visit

## 2024-03-13 ENCOUNTER — Inpatient Hospital Stay: Attending: Physician Assistant

## 2024-03-13 VITALS — BP 130/80 | HR 70 | Temp 97.7°F | Resp 17 | Wt 144.9 lb

## 2024-03-13 DIAGNOSIS — G893 Neoplasm related pain (acute) (chronic): Secondary | ICD-10-CM | POA: Diagnosis not present

## 2024-03-13 DIAGNOSIS — Z515 Encounter for palliative care: Secondary | ICD-10-CM | POA: Diagnosis not present

## 2024-03-13 DIAGNOSIS — C7800 Secondary malignant neoplasm of unspecified lung: Secondary | ICD-10-CM | POA: Diagnosis not present

## 2024-03-13 DIAGNOSIS — Z79899 Other long term (current) drug therapy: Secondary | ICD-10-CM | POA: Diagnosis not present

## 2024-03-13 DIAGNOSIS — C787 Secondary malignant neoplasm of liver and intrahepatic bile duct: Secondary | ICD-10-CM | POA: Diagnosis not present

## 2024-03-13 DIAGNOSIS — C20 Malignant neoplasm of rectum: Secondary | ICD-10-CM

## 2024-03-13 DIAGNOSIS — R530 Neoplastic (malignant) related fatigue: Secondary | ICD-10-CM

## 2024-03-13 DIAGNOSIS — Z87891 Personal history of nicotine dependence: Secondary | ICD-10-CM | POA: Diagnosis not present

## 2024-03-13 DIAGNOSIS — Z95828 Presence of other vascular implants and grafts: Secondary | ICD-10-CM

## 2024-03-13 DIAGNOSIS — C775 Secondary and unspecified malignant neoplasm of intrapelvic lymph nodes: Secondary | ICD-10-CM | POA: Diagnosis not present

## 2024-03-13 LAB — CBC WITH DIFFERENTIAL (CANCER CENTER ONLY)
Abs Immature Granulocytes: 0.01 10*3/uL (ref 0.00–0.07)
Basophils Absolute: 0 10*3/uL (ref 0.0–0.1)
Basophils Relative: 1 %
Eosinophils Absolute: 0 10*3/uL (ref 0.0–0.5)
Eosinophils Relative: 1 %
HCT: 39.5 % (ref 39.0–52.0)
Hemoglobin: 13.3 g/dL (ref 13.0–17.0)
Immature Granulocytes: 0 %
Lymphocytes Relative: 46 %
Lymphs Abs: 1.1 10*3/uL (ref 0.7–4.0)
MCH: 31.1 pg (ref 26.0–34.0)
MCHC: 33.7 g/dL (ref 30.0–36.0)
MCV: 92.3 fL (ref 80.0–100.0)
Monocytes Absolute: 0.2 10*3/uL (ref 0.1–1.0)
Monocytes Relative: 10 %
Neutro Abs: 1 10*3/uL — ABNORMAL LOW (ref 1.7–7.7)
Neutrophils Relative %: 42 %
Platelet Count: 151 10*3/uL (ref 150–400)
RBC: 4.28 MIL/uL (ref 4.22–5.81)
RDW: 16.3 % — ABNORMAL HIGH (ref 11.5–15.5)
WBC Count: 2.4 10*3/uL — ABNORMAL LOW (ref 4.0–10.5)
nRBC: 0 % (ref 0.0–0.2)

## 2024-03-13 LAB — CMP (CANCER CENTER ONLY)
ALT: 12 U/L (ref 0–44)
AST: 19 U/L (ref 15–41)
Albumin: 3.5 g/dL (ref 3.5–5.0)
Alkaline Phosphatase: 85 U/L (ref 38–126)
Anion gap: 1 — ABNORMAL LOW (ref 5–15)
BUN: 11 mg/dL (ref 8–23)
CO2: 33 mmol/L — ABNORMAL HIGH (ref 22–32)
Calcium: 8.8 mg/dL — ABNORMAL LOW (ref 8.9–10.3)
Chloride: 102 mmol/L (ref 98–111)
Creatinine: 0.78 mg/dL (ref 0.61–1.24)
GFR, Estimated: >60 mL/min (ref >60)
Glucose, Bld: 88 mg/dL (ref 70–99)
Potassium: 4.1 mmol/L (ref 3.5–5.1)
Sodium: 136 mmol/L (ref 135–145)
Total Bilirubin: 0.6 mg/dL (ref 0.0–1.2)
Total Protein: 6.8 g/dL (ref 6.5–8.1)

## 2024-03-13 LAB — CEA (ACCESS): CEA (CHCC): 323.21 ng/mL — ABNORMAL HIGH (ref 0.00–5.00)

## 2024-03-13 LAB — MISCELLANEOUS TEST

## 2024-03-13 LAB — TOTAL PROTEIN, URINE DIPSTICK: Protein, ur: 300 mg/dL — AB

## 2024-03-13 MED ORDER — SODIUM CHLORIDE 0.9% FLUSH
10.0000 mL | Freq: Once | INTRAVENOUS | Status: AC
  Administered 2024-03-13: 10 mL

## 2024-03-13 MED ORDER — REGORAFENIB 40 MG PO TABS: ORAL_TABLET | ORAL | 0 refills | Status: DC

## 2024-03-13 MED ORDER — HEPARIN SOD (PORK) LOCK FLUSH 100 UNIT/ML IV SOLN
500.0000 [IU] | Freq: Once | INTRAVENOUS | Status: AC
  Administered 2024-03-13: 500 [IU]

## 2024-03-13 NOTE — Assessment & Plan Note (Addendum)
 [id number] with lung metastasis, MMR normal, KRAS(+) [id number]  -diagnosed 08/2021 by colonoscopy for rectal bleeding and frequent BM. Staging MRI showed early extra mesorectal lymph node involvement.  -baseline CEA 571.84 on 09/06/21.  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 Xeloda  dose reduced due to elevated liver enzymes.  -due to new liver lesions, we switched to second-line FOLFIRI, with continued beva, on 02/21/22. He tolerates well overall.  - restaging CT scan on April 22 and June 26 showed stable disease overall. -CT 10/09/2023 showed mild disease progression in lungs, his tumor marker CEA has been going up also.  --His chemo was changed to lonsurf  and beva, he started on 12/13/2023 -02/25/2024 - CT CAP showing mixed response to treatment with Lonsurf  and bevacizumab .  There is interval increase in size of lung metastases. Improved mediastinal and right hilar nodal metastases. Unchanged wall thickening along the rectum with stranding.  -Bevacizumab  held on 02/28/2024 due to urine protein >300. -03/13/2024 -patient will finish final week of Lonsurf  on Monday, 03/17/2024.  Plan to start new treatment with Stivarga 40 mg tablets on 03/24/2024.  Loading dose to begin: 80 mg x 7 days; then 120 mg x 7 days; then 160 mg x 7 days; then 1 week without medication.- -Foundation One testing obtained today.  Will adjust chemotherapy/immunotherapy treatment based on results of new Foundation One testing. - Will see patient back with labs/flush, and follow-up during second week of new treatment.  This should be around 04/07/2024.

## 2024-03-13 NOTE — Telephone Encounter (Signed)
 Oral Oncology Patient Advocate Encounter   **Patient is uninsured. PAP application started.**   Began application for assistance for Stivarga through [employer].   Application will be submitted upon completion of necessary supporting documentation.   [employer]'s phone number [phone].   I will continue to check the status until final determination.    Hansel Ley, CPhT Pharmacy Technician Coordinator [facility] [phone] (Ph) 03/13/2024 4:22 PM

## 2024-03-13 NOTE — Telephone Encounter (Signed)
 Oral Oncology Pharmacist Encounter  Received new prescription for Stivarga (regorafenib) for the treatment of metastatic rectal cancer, planned duration until disease progression or unacceptable drug toxicity.  CBC w/ Diff and CMP from 03/13/24 assessed, patient with WBC 2.4 K/uL (ANC 1000). Patient currently in middle of last Lonsurf  cycle and plan will not be to start Stivarga until 03/24/24. Prescription dose and frequency assessed for appropriateness. Patient will dose escalate first cycle to help improve tolerance.   Current medication list in Epic reviewed, no relevant/significant DDIs with Stivarga identified.  Evaluated chart and no patient barriers to medication adherence noted.   Patient agreement for treatment documented in MD note on 03/13/24.  I have called the pharmacy patient was obtaining Lonsurf  through and cancelled all future fills so that it does not get inadvertently filled for patient.   Due to patient not having prescription drug insurance, Oral Chemotherapy clinic will proceed with applying to obtain Stivarga through patient assistance.  Oral Oncology Clinic will continue to follow for insurance authorization, copayment issues, initial counseling and start date.  Jude Norton, PharmD, BCPS, BCOP Hematology/Oncology Clinical Pharmacist [facility] Oral Chemotherapy Navigation Clinics [phone] 03/13/2024 4:11 PM

## 2024-03-13 NOTE — Progress Notes (Signed)
 Palliative Medicine [facility]  Telephone:([phone]) [phone] Fax:(336) [phone]   Name: Jorge [name] Date: 03/13/2024 MRN: [id number]  DOB: 1951/08/23  Patient Care Team: Jorge Spry, NP as PCP - General (Family Medicine) Jorge, Karyl Paget, MD as PCP - Cardiology (Cardiology) [name], Jorge Labrum, NP as Nurse Practitioner (Nurse Practitioner) Jorge [place], MD as Consulting Physician (Hematology) Jorge [place], LCSW as VBCI Care Management (Licensed Clinical Social Worker) Jorge [place], LCSW (Licensed Clinical Social Worker)   INTERVAL HISTORY: Jorge [name] is a 73 y.o. 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  Palliative ask to see for symptom management and goals of care.   SOCIAL HISTORY:     reports that he quit smoking about 5 months ago. His smoking use included cigarettes. He has a 5 pack-year smoking history. He has never used smokeless tobacco. He reports that he does not currently use alcohol. He reports that he does not use drugs.  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  MEDICATIONS:  Current Outpatient Medications  Medication Sig Dispense Refill   amLODipine  (NORVASC ) 10 MG tablet Take 1 tablet (10 mg total) by mouth daily. 90 tablet 1   diphenoxylate -atropine  (LOMOTIL ) 2.5-0.025 MG tablet Take 1-2 tablets by mouth 4 (four) times daily as needed for diarrhea or loose stools. 90 tablet 1   gabapentin  (NEURONTIN ) 100 MG capsule Take 2 capsules (200 mg total) by mouth 3 (three) times daily. 180 capsule 2   LONSURF  20-8.19 MG tablet TAKE THREE 20MG  TABLETS FOR A TOTAL DOSE OF (60MG ) BY MOUTH TWICE DAILY ON DAYS 1-5 AND DAYS 8-12 OF EACH 28 DAY CYCLE. 60 tablet 8    metoprolol  tartrate (LOPRESSOR ) 25 MG tablet Take 1 tablet (25 mg total) by mouth 2 (two) times daily. 60 tablet 0   Multiple Vitamin (MULTIVITAMIN WITH MINERALS) TABS tablet Take 1 tablet by mouth daily.     ondansetron  (ZOFRAN ) 8 MG tablet Take 1 tablet (8 mg total) by mouth every 8 (eight) hours as needed for nausea or vomiting. 30 tablet 3   oxyCODONE  (OXY IR/ROXICODONE ) 5 MG immediate release tablet Take 1 tablet (5 mg total) by mouth every 6 (six) hours as needed for severe pain (pain score 7-10) or breakthrough pain. 60 tablet 0   pantoprazole  (PROTONIX ) 40 MG tablet Take 1 tablet (40 mg total) by mouth daily before breakfast. 90 tablet 0   prochlorperazine  (COMPAZINE ) 10 MG tablet Take 1 tablet (10 mg total) by mouth every 6 (six) hours as needed (Nausea or vomiting). 30 tablet 1   Pseudoephedrine-APAP-DM (DAYQUIL PO) Take 1 tablet by mouth as needed.     No current facility-administered medications for this visit.    VITAL SIGNS: There were no vitals taken for this visit. There were no vitals filed for this visit.   Estimated body mass index is 19.65 kg/m as calculated from the following:   Height as of 12/05/23: 6' (1.829 m).   Weight as of an earlier encounter on 03/13/24: 144 lb 14.4 oz (65.7 kg).   PERFORMANCE STATUS (ECOG) : 1 - Symptomatic but completely ambulatory  IMPRESSION:  [name] presents to clinic today for symptom management follow-up.  No acute distress noted.  Continues to  remain as active as possible. Occasional fatigue.  Reports his appetite fluctuates.  Some days are better than others but feels that it has much improved.  Current weight is down to 144lbs from148 pounds on 4/24.  Denies concerns with nausea, vomiting, or constipation.  Loose stools currently managed with Lomotil  as needed. Not occurring as often.   We discussed his pain at length.  [name] reports pain is well-controlled on current regimen however does have some worsening neuropathic  discomfort. His current regimen consist of gabapentin  200 mg 3 times daily and oxycodone  5 mg every 6 hours as needed. Advised to increase gabapentin  to 300 mg 3 times a day.  He verbalized understanding.  Will continue to closely monitor.  All questions answered and support provided.   PLAN:  Increase gabapentin  to 300mg  three times daily.   Oxy IR 5mg  every 4-6 hours as needed for severe pain over the next 7 days. Will send in refill as appropriate. I will plan to see patient back in 4-6 weeks in collaboration with his oncology appointments.   Patient expressed understanding and was in agreement with this plan. He also understands that He can call the clinic at any time with any questions, concerns, or complaints.     Any controlled substances utilized were prescribed in the context of palliative care. PDMP has been reviewed.    Visit consisted of counseling and education dealing with the complex and emotionally intense issues of symptom management and palliative care in the setting of serious and potentially life-threatening illness.  [name], AGPCNP-BC  Palliative Medicine Team/[facility]

## 2024-03-13 NOTE — Progress Notes (Addendum)
 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 Staging MRI showed early extra mesorectal lymph node involvement.  -baseline CEA 571.84 on 09/06/21.  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 Xeloda  dose reduced due to elevated liver enzymes.  -due to new liver lesions, we switched to second-line FOLFIRI, with continued beva, on 02/21/22. He tolerates well overall.  - restaging CT scan on April 22 and June 26 showed stable disease overall. -CT 10/09/2023 showed mild disease progression in lungs, his tumor marker CEA has been going up also.  --His chemo was changed to lonsurf  and beva, he started on 12/13/2023 -02/25/2024 - CT CAP showing mixed response to treatment with Lonsurf  and bevacizumab .  There is interval increase in size of lung metastases. Improved mediastinal and right hilar nodal metastases. Unchanged wall thickening along the rectum with stranding.  -Bevacizumab  held on 02/28/2024 due to urine protein >300. -03/13/2024 -patient will finish final week of Lonsurf  on Monday, 03/17/2024.  Plan to start new treatment with Stivarga  40 mg tablets on 03/24/2024.  Loading dose to begin: 80 mg x 7 days; then 120 mg x 7 days; then 160 mg x 7 days; then 1 week without  medication.- -Foundation One testing obtained today.  Will adjust chemotherapy/immunotherapy treatment based on results of new Foundation One testing. - Will see patient back with labs/flush, and follow-up during second week of new treatment.  This should be around 04/07/2024.   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  There was noted interval enlargement of pulmonary nodules.  Based on this, will discontinue current treatment of Lonsurf  and bevacizumab .  He will finish current 5-day segment of Lonsurf  and then stop.  Plan to start Stivarga  around 03/24/2024.  Discussed with him and his daughter, who joined our visit via telephone, loading dose information.  Medication comes in 40 mg tablets.  He will take 2 tablets daily for 1 week,  then increase to 3 tablets daily for 1 week, then 4 tablets daily for 1 week, then take 1 week off.  If he tolerates well, restart at 160 mg following week without Stivarga .  Instructions were written down for patient to take home.  Advised him instructions would also be on prescription bottle.  Prescription sent to [facility] outpatient pharmacy.  Leukopenia WBC is 2.4 today.  This is likely result of Lonsurf .  Will continue to check this with every visit.  Plan:  Patient was seen along with Dr. Maryalice Smaller today.  Reviewed labs. - Mildly low WBC with other labs unremarkable. CT CAP results from 02/25/2024 were discussed with the patient again.  Results showed mixed response with interval enlargement of pulmonary nodules. - Lengthy discussion regarding change in treatment.  Advised him to finish current 5-day section of  Lonsurf .  Plan to start Stivarga  40 mg tablets around 03/24/2024.  Detailed instructions were given to him and his daughter, who joined us  via telephone during the visit.  Instructions were also written for patient to take  home.  He and his daughter both voiced understanding of all instructions. Plan to draw labs, flush port, and follow-up with patient on third week following initiation of treatment.  This would be approximately 04/07/2024. Foundation One molecular testing obtained today.  Will add/adjust treatment as indicated.  The patient understands the plans discussed today and is in agreement with them.  He knows to contact our office if he develops concerns prior to his next appointment.  I provided 30 minutes of face-to-face time during this encounter and > 50% was spent counseling as documented under my assessment and plan.    Sharyon Deis, NP  [facility] - A DEPT OF [facility] [address] [place] [address] Dept: [phone] Dept Fax: [phone]   No orders of the defined types were placed in this encounter.     CHIEF COMPLAINT:  CC: Rectal cancer with lung metastasis  Current Treatment: Lonsurf  and bevacizumab   INTERVAL HISTORY:  Jorge [name] is here today for repeat clinical assessment.  Last seen by me on 02/28/2024.  Discussed mixed response to treatment with Lonsurf  and bevacizumab .  Bevacizumab  was held on 02/28/2024 due to proteinuria.  Foundation One molecular testing on both liquid and tissue samples was obtained today to determine appropriate adjustments in current therapy. He denies chest pain, chest pressure, or shortness of breath. He denies headaches or visual disturbances. He denies abdominal pain, nausea, vomiting, or changes in bowel or bladder habits.  States he has some loose bowel movements during the night.  He feels like this is due to eating late at night and not making very good food choices.  He denies fevers or chills. He denies pain. His appetite is good. His weight has decreased 2 pounds over last 2 weeks.  I have reviewed the past medical history, past surgical history, social history and family  history with the patient and they are unchanged from previous note.  ALLERGIES:  has no known allergies.  MEDICATIONS:  Current Outpatient Medications  Medication Sig Dispense Refill   amLODipine  (NORVASC ) 10 MG tablet Take 1 tablet (10 mg total) by mouth daily. 90 tablet 1   diphenoxylate -atropine  (LOMOTIL ) 2.5-0.025 MG tablet Take 1-2 tablets by mouth 4 (four) times daily as needed for diarrhea or loose stools. 90 tablet 1   gabapentin  (NEURONTIN ) 100 MG capsule Take 2 capsules (200 mg total) by mouth 3 (three) times daily. 180 capsule 2   metoprolol  tartrate (LOPRESSOR ) 25 MG tablet Take 1 tablet (25 mg total) by mouth 2 (two) times daily. 60 tablet 0   Multiple Vitamin (MULTIVITAMIN WITH MINERALS) TABS tablet Take 1 tablet by mouth daily.     ondansetron  (ZOFRAN ) 8 MG tablet Take 1 tablet (8 mg total) by mouth every 8 (eight) hours as needed for nausea or vomiting. 30 tablet 3   oxyCODONE  (OXY IR/ROXICODONE ) 5 MG immediate release tablet Take 1 tablet (5 mg total) by mouth every 6 (six) hours as needed for severe pain (pain score 7-10) or breakthrough pain. 60 tablet 0   pantoprazole  (PROTONIX ) 40 MG tablet Take 1 tablet (40 mg total) by mouth daily before breakfast. 90 tablet 0   prochlorperazine  (COMPAZINE ) 10 MG tablet Take 1 tablet (10  mg total) by mouth every 6 (six) hours as needed (Nausea or vomiting). 30 tablet 1   Pseudoephedrine-APAP-DM (DAYQUIL PO) Take 1 tablet by mouth as needed.     regorafenib  (STIVARGA ) 40 MG tablet Week 1: Take 2 tablets (80 mg) po every day for 1 week;  Then Week 2: Take 3 tablets (120 mg) po every day for 1 week; Then Week 3: Take 4 tablets (160 mg) po every day for 1 week; Then Week 4: no medication for 1 week. 63 tablet 0   No current facility-administered medications for this visit.    HISTORY OF PRESENT ILLNESS:   Oncology History Overview Note  Cancer Staging Rectal cancer [facility]) Staging form: Colon and Rectum, AJCC 8th Edition - Clinical stage  from 08/29/2021: Jorge [name], cM1 - Signed by Sonja [place], MD on 08/29/2021    Rectal cancer (HCC)  07/23/2021 Imaging   CT AP  IMPRESSION: 1. Appearance of the rectum likely indicates a rectal mass suspicious for rectal carcinoma. Inflammatory process would be less likely. Direct visualization is suggested. 2. Cholelithiasis without evidence of acute cholecystitis. 3. Aortic atherosclerosis.   08/19/2021 Procedure   Colonoscopy, under Dr. Brice Campi  Impression: - Rectal tenderness, palpable rectal mass and hemorrhoids found on digital rectal exam. - Stool in the entire examined colon - lavaged with still inadequate clearance. - One 35 mm polyp at the hepatic flexure. Biopsied. Tattooed distal in case future endoscopic resection is considered - however, has larger issues with rectal mass currently as below. - Four, 5 to 11 mm polyps in the transverse colon and at the hepatic flexure, removed with a cold snare. Resected and retrieved. - Diverticulosis in the recto-sigmoid colon and in the sigmoid colon. - Rule out malignancy, partially obstructing tumor in the rectum and from 6 to 13 cm proximal to the anus. Biopsied.   08/19/2021 Pathology Results   Diagnosis 1. Hepatic Flexure Biopsy - TUBULAR ADENOMA WITHOUT HIGH-GRADE DYSPLASIA OR MALIGNANCY 2. Transverse Colon Biopsy, and hepatic flexure, polyps (4) - TUBULAR ADENOMA WITHOUT HIGH-GRADE DYSPLASIA OR MALIGNANCY - OTHER FRAGMENTS OF POLYPOID COLONIC MUCOSA WITH NO SPECIFIC HISTOPATHOLOGIC CHANGES - FOOD MATERIAL 3. Rectum, biopsy - ADENOCARCINOMA. SEE NOTE   08/22/2021 Initial Diagnosis   Rectal cancer (HCC)   08/26/2021 Imaging   IMPRESSION: 1. A 2.2 x 1.9 cm right middle lobe pulmonary nodule as well as a total of four right upper lobe pulmonary nodule and masses measuring up to 3.6 cm. Findings concerning for metastatic primary lung cancer versus less likely metastases in a patient with rectal cancer. Additional imaging  evaluation or consultation with Pulmonology or Thoracic Surgery recommended. 2. No gross hilar adenopathy, noting limited sensitivity for the detection of hilar adenopathy on this noncontrast study. 3. Cholelithiasis. 4.  Emphysema (ICD10-J43.9). 5. At least left anterior descending coronary artery calcifications.   08/27/2021 Imaging   IMPRESSION: Rectal adenocarcinoma T stage: T4 B   Rectal adenocarcinoma N stage: N2 disease likely associated with early extra mesorectal lymph node involvement.   Distance from tumor to the internal anal sphincter is 1.2 cm.   Also with extramural venous involvement as described.   08/29/2021 Cancer Staging   Staging form: Colon and Rectum, AJCC 8th Edition - Clinical stage from 08/29/2021: Jorge [name], cM1 - Signed by Sonja Kimberly, MD on 08/29/2021 Stage prefix: Initial diagnosis   01/23/2022 Imaging   CT CAP w contrast IMPRESSION: 1. Mild interval decrease in size of multiple right-sided pulmonary nodules. No new suspicious pulmonary nodule or mass.  2. Interval development of approximately 10 new ill-defined hypoattenuating liver lesions ranging in size from approximately 5-10 mm. Imaging features suspicious for metastatic disease. MRI abdomen with and without contrast may prove helpful to further evaluate. 3. Similar appearance of wall thickening in the rectum with perirectal edema. 4. Cholelithiasis. 5. Prostatomegaly. 6. Aortic Atherosclerosis (ICD10-I70.0).   11/21/2022 Imaging    IMPRESSION: 1. Stable exam. No new or progressive interval findings. 2. The primary rectosigmoid lesion described previously is not well seen on the current study. There is presacral and perirectal edema, likely treatment related. 3. No substantial change in appearance of bilateral pulmonary metastases. 4. Stable tiny hypodensities in both hepatic lobes. These were previously characterized as metastatic disease. No new liver lesions on today's exam. 5.  Cholelithiasis. 6. Emphysema (ICD10-J43.9) and Aortic Atherosclerosis (ICD10-170.0)     02/25/2024 Imaging   CT Chest, Abdomen, and Pelvis with contrast  IMPRESSION: Multiple lung nodules are again identified. These are increased in size from previous.   Previous nodes however in the mediastinum and right lung hilum are improving.   Fatty infiltration with multiple low-attenuation liver lesions. Many these are slightly larger but more low in density today. Dilated gallbladder with stones.   Persistent wall thickening and nodularity along the rectum with adjacent stranding and fluid. Please correlate with exact location of neoplasm.   Overall mixed response.       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 These are increased in size from previous.  Previous nodes however in the mediastinum and right lung hilum are improving.   Fatty infiltration with multiple low-attenuation liver lesions. Many these are slightly larger but more low in density today.   Dilated gallbladder with stones. Persistent wall thickening and nodularity along the rectum with adjacent stranding and fluid. Please correlate with exact location of neoplasm.   Overall mixed response.           REVIEW OF SYSTEMS:   Constitutional: Denies fevers, chills or abnormal weight loss. Appetite  good. States he is not always making the best food choices . Eyes: Denies blurriness of vision Ears, nose, mouth, throat, and face: Denies mucositis or sore throat Respiratory: Denies cough, dyspnea or wheezes Cardiovascular: Denies palpitation, chest discomfort or lower extremity swelling Gastrointestinal:  Denies nausea, heartburn or change in bowel habits Skin: Denies abnormal skin rashes Lymphatics: Denies new lymphadenopathy or easy bruising Neurological:Denies numbness, tingling or new weaknesses Behavioral/Psych: Mood is stable, no new changes  All other systems were reviewed with the patient and are negative.   VITALS:   Today's Vitals   03/13/24 1303 03/13/24 1309  BP: 130/80   Pulse: 70   Resp: 17   Temp: 97.7 F (36.5 C)   SpO2: 99%   Weight: 144 lb 14.4 oz (65.7 kg)   PainSc:  0-No pain   Body mass  index is 19.65 kg/m.   Wt Readings from Last 3 Encounters:  03/13/24 144 lb 14.4 oz (65.7 kg)  02/28/24 146 lb 8 oz (66.5 kg)  02/14/24 148 lb 8 oz (67.4 kg)    Body mass index is 19.65 kg/m.  Performance status (ECOG): 1 - Symptomatic but completely ambulatory  PHYSICAL EXAM:   GENERAL:alert, no distress and comfortable SKIN: skin color, texture, turgor are normal, no rashes or significant lesions EYES: normal, Conjunctiva are pink and non-injected, sclera clear OROPHARYNX:no exudate, no erythema and lips, buccal mucosa, and tongue normal  NECK: supple, thyroid normal size, non-tender, without nodularity LYMPH:  no palpable lymphadenopathy in the cervical, axillary or inguinal LUNGS: clear to auscultation and percussion with normal breathing effort HEART: regular rate & rhythm and no murmurs and no lower extremity edema ABDOMEN:abdomen soft, non-tender and normal bowel sounds. Intact colostomy  Musculoskeletal:no cyanosis of digits and no clubbing  NEURO: alert & oriented x 3 with fluent speech, no focal motor/sensory deficits  LABORATORY DATA:  I have  reviewed the data as listed    Component Value Date/Time   NA 136 03/13/2024 1226   K 4.1 03/13/2024 1226   CL 102 03/13/2024 1226   CO2 33 (H) 03/13/2024 1226   GLUCOSE 88 03/13/2024 1226   BUN 11 03/13/2024 1226   CREATININE 0.78 03/13/2024 1226   CALCIUM  8.8 (L) 03/13/2024 1226   PROT 6.8 03/13/2024 1226   ALBUMIN 3.5 03/13/2024 1226   AST 19 03/13/2024 1226   ALT 12 03/13/2024 1226   ALKPHOS 85 03/13/2024 1226   BILITOT 0.6 03/13/2024 1226   GFRNONAA >60 03/13/2024 1226     Lab Results  Component Value Date   WBC 2.4 (L) 03/13/2024   NEUTROABS 1.0 (L) 03/13/2024   HGB 13.3 03/13/2024   HCT 39.5 03/13/2024   MCV 92.3 03/13/2024   PLT 151 03/13/2024    RADIOGRAPHIC STUDIES: CT CHEST ABDOMEN PELVIS W CONTRAST Result Date: 02/25/2024 CLINICAL DATA:  Rectal cancer. Assess treatment response. X-ray Ts features. Ongoing chemotherapy. * Tracking Code: BO *. EXAM: CT CHEST, ABDOMEN, AND PELVIS WITH CONTRAST TECHNIQUE: Multidetector CT imaging of the chest, abdomen and pelvis was performed following the standard protocol during bolus administration of intravenous contrast. RADIATION DOSE REDUCTION: This exam was performed according to the departmental dose-optimization program which includes automated exposure control, adjustment of the mA and/or kV according to patient size and/or use of iterative reconstruction technique. CONTRAST:  OMNIPAQUE  IOHEXOL  300 MG/ML  SOLN COMPARISON:  CTA 11/24/2023 and older chest CT. Abdomen pelvis CT 12/20/2023 and older. FINDINGS: CT CHEST FINDINGS Cardiovascular: Thoracic aorta is normal caliber with slight atherosclerotic disease. Bovine type aortic arch, a normal variant. Heart is nonenlarged. No significant pericardial effusion. Coronary calcifications are seen. Right IJ chest port in place. The visualized portions of the internal jugular vein on the right are atretic. Unchanged from previous. Tip of the catheter extends to the SVC right atrial  junction. Mediastinum/Nodes: Patulous esophagus with some wall thickening. Please correlate with any symptoms. Small thyroid gland. No specific abnormal lymph node enlargement identified in the axillary regions. Right hilar lymph node is improving. Previously short axis dimension of 19 mm. Today short axis transverse dimension of 14 mm on series 2 image 33. No new hilar nodes bilaterally. Mediastinal nodes are also smaller. The subcarinal node which previously measured 11 mm in short axis, today on series 2 image 33 measures 5 mm. Paratracheal nodes are smaller. No new mediastinal nodal enlargement. Lungs/Pleura:  Emphysematous lung changes are identified. No consolidation, pneumothorax or effusion. Dominant lung nodules are once again seen. For example lesion anterior right upper lobe which previously measured 2.4 by 2.0 cm, today on on series 6, image 66 measures 3.0 by 3.0 cm. Mass in the anterior inferior right upper lobe which previously measured 2.8 by 1.8 cm, today measures 3.4 by 2.6 cm on image 91 of series 6. Middle lobe mass which abuts the pleura anteriorly and on the prior measured approximately 2.9 x 1.9 cm, today measures 3.2 by 3.0 cm on image 108 of series 6. Small left upper lobe nodule also larger. On image 43 today this measures 7 mm in AP dimension and previously 5 mm. New small area as well as superior segment left lower lobe on image 91. On the prior CT scan the abdomen and pelvis there are some reticulonodular changes in the right lower lobe which have shown some improvement. Musculoskeletal: Scattered degenerative changes along the spine. Curvature of the spine as well. CT ABDOMEN PELVIS FINDINGS Hepatobiliary: Fatty liver infiltration. Slight nodular contour to liver. Low-attenuation lesion seen previously along segment 5 liver medially that measured 10 mm, today is larger but lower in density measuring 15 mm on image 79 of series 2. Small focus in segment 3 which measured 9 mm previously,  today on image 72 of series 2 measures 11 mm. Few other tiny low-density foci elsewhere. Again slightly larger but lower in density. Patent portal vein. Stones dependently in the distended gallbladder. Please correlate with symptoms. Pancreas: Unremarkable. No pancreatic ductal dilatation or surrounding inflammatory changes. Spleen: Normal in size without focal abnormality. Adrenals/Urinary Tract: Adrenal glands are unremarkable. Kidneys are normal, without renal calculi, focal lesion, or hydronephrosis. Bladder is unremarkable. Stomach/Bowel: Stomach is underdistended. Borderline fold thickening. There is oral contrast in the small bowel. Small bowel is nondilated. The large bowel has a normal course and caliber. Diffuse colonic stool. Wall thickening identified along the rectosigmoid colon with stranding. Persistent fluid in the presacral space and stranding surrounding the rectum. Thickening of the mesorectal fascia. Please correlate with exact location of neoplasm. Vascular/Lymphatic: Aortic atherosclerosis. No enlarged abdominal or pelvic lymph nodes. Reproductive: Enlarged prostate with mass effect along the base of the bladder. Other: No abdominal wall hernia or abnormality. No abdominopelvic ascites. Musculoskeletal: Curvature of the spine. Scattered degenerative changes of the spine and pelvis. IMPRESSION: Multiple lung nodules are again identified. These are increased in size from previous. Previous nodes however in the mediastinum and right lung hilum are improving. Fatty infiltration with multiple low-attenuation liver lesions. Many these are slightly larger but more low in density today. Dilated gallbladder with stones. Persistent wall thickening and nodularity along the rectum with adjacent stranding and fluid. Please correlate with exact location of neoplasm. Overall mixed response. Electronically Signed   By: Adrianna Horde M.D.   On: 02/25/2024 17:01   Addendum I have seen the patient, examined  him. I agree with the assessment and and plan and have edited the notes.   Patient is clinically stable, due to his recent cancer progression on CT, I recommend change treatment from Lonsurf  and bevacizumab  to Stivarga .  Foundation liquid biopsy was obtained today, to see if he has additional targeted therapy.  I reviewed the benefit and potential side effect of Stivarga , and recommend dose ramping for the first cycle.  He will start at 80 mg (2 tabs) daily for the first week, then increase to 120 mg daily on second week, and 160 mg daily  on third week if he tolerates well.  Potential side effects, especially hypertension, hand-foot syndrome, small risk of thrombosis and hemorrhage, were discussed with patient and his daughter (on the phone) in detail, he agrees to proceed.  He will complete the current cycle Lonsurf  next Monday.  Plan to start steroids on May 19. Lab and f/u on 6/2.  I spent a total of 30 mins for his visit today.  Sonja Jermyn MD 03/13/2024

## 2024-03-14 ENCOUNTER — Telehealth: Payer: Self-pay | Admitting: Pharmacy Technician

## 2024-03-14 ENCOUNTER — Other Ambulatory Visit (HOSPITAL_COMMUNITY): Payer: Self-pay

## 2024-03-14 ENCOUNTER — Encounter: Payer: Self-pay | Admitting: Hematology

## 2024-03-14 NOTE — Telephone Encounter (Signed)
 Oral Oncology Patient Advocate Encounter  Contacted patient to let him know that he is considered uninsured since he does not have Medicare part D. (Patient has Medicare part A and B and family planning Medicaid)  Patient was not available, I spoke with patient's daughter, Amalia Badder. I explained to her some of the benefits of getting her father Medicare part D and also explained the beginning process of PAP. Patient voiced understanding and knows to call back if they do decide to enroll the patient in Medicare part D. She also knows we will be contacting her next week to begin the process of PAP.  Patty Benjaman Branch, CPhT Oncology Pharmacy Patient Advocate [facility] Direct Number: [phone] Fax: [phone]

## 2024-03-14 NOTE — Telephone Encounter (Signed)
 Oral Oncology Patient Advocate Encounter  Patient's daughter, Amalia Badder, will be coming to clinic on 03/18/2024 at 9:30am to sign PAP application.  She will be emailing SS letter to pharmacist, Ivette Marks, sometime today.  Patty Benjaman Branch, CPhT Oncology Pharmacy Patient Advocate [facility] Direct Number: [phone] Fax: [phone]

## 2024-03-17 ENCOUNTER — Other Ambulatory Visit: Payer: Self-pay | Admitting: Nurse Practitioner

## 2024-03-17 ENCOUNTER — Other Ambulatory Visit: Payer: Self-pay

## 2024-03-17 DIAGNOSIS — G893 Neoplasm related pain (acute) (chronic): Secondary | ICD-10-CM

## 2024-03-17 DIAGNOSIS — C20 Malignant neoplasm of rectum: Secondary | ICD-10-CM

## 2024-03-17 DIAGNOSIS — Z515 Encounter for palliative care: Secondary | ICD-10-CM

## 2024-03-17 MED ORDER — OXYCODONE HCL 5 MG PO TABS
5.0000 mg | ORAL_TABLET | Freq: Four times a day (QID) | ORAL | 0 refills | Status: DC | PRN
Start: 2024-03-17 — End: 2024-03-28

## 2024-03-18 NOTE — Telephone Encounter (Signed)
 Oral Oncology Patient Advocate Encounter   Submitted application for assistance for Stivarga  to [employer].   Application submitted via e-fax to [phone]   [facility] Patient [facility] phone number [phone].   I will continue to check the status until final determination.   Roda Cirri, CPhT Specialty Pharmacy Patient Advocate Phone: [phone] Fax: [phone]

## 2024-03-21 ENCOUNTER — Encounter (HOSPITAL_COMMUNITY): Payer: Self-pay | Admitting: Hematology

## 2024-03-24 ENCOUNTER — Other Ambulatory Visit (HOSPITAL_COMMUNITY): Payer: Self-pay

## 2024-03-24 NOTE — Telephone Encounter (Addendum)
 Oral Oncology Patient Advocate Encounter  Called to check status of assistance application for Stivarga  through [employer].  Status is pending denial letter for LIS. Their notes indicated they spoke with him on Friday.   [employer] phone [phone]  I will continue to check status until final determination.  Called and spoke with pt's dtr. She did apply for the Medicare Extra Help (LIS) on Friday. She will see if she can log back in to check the status of that. She also applied for full Medicaid, but was told that it may take up to a Month for the determination to be made there.   Roda Cirri, CPhT Specialty Pharmacy Patient Advocate Phone: [phone] Fax: [phone]

## 2024-03-24 NOTE — Telephone Encounter (Signed)
 Oral Chemotherapy Pharmacist Encounter  I met with patient for overview of: Stivarga  (regorafenib ) for the treatment of metastatic rectal cancer, planned duration until disease progression or unacceptable toxicity.  Counseled patient on administration, dosing, side effects, monitoring, drug-food interactions, safe handling, storage, and disposal.  Patient will be started on low dose Stivarga  for the first cycle while awaiting either patient assistance approval for Stivarga  OR patient to sign up for medicaid/LIS to make medication more affordable.   Patient will take Stivarga  40mg  tablets, 2 tablets (80mg ) by mouth once daily with a glass of water and a low fat meal of <600 calories, and <30% fat content.  Patient will take Stivarga  for 3 weeks on, 1 week off, repeated every 4 weeks.  Patient is aware his dosing will likely be higher for subsequent cycles.  Patient knows to avoid grapefruit and grapefruit juice while on therapy with Stivarga .  Stivarga  start date: 03/25/24  Adverse effects include but are not limited to: hand-foot syndrome, diarrhea, nausea, abdominal pain, musculoskeletal pain, fatigue, hypertension, decreased blood counts, and hepatotoxicity.   Diarrhea: Patient will obtain anti diarrheal and alert the office of 4 or more loose stools above baseline. Nausea/Vomiting: Patient has anti-emetic on hand and knows to take it if nausea develops.   Delayed wound healing: Stivarga  should be discontinued 2 weeks prior to any planned surgery and resumed postsurgery based on clinical judgement of wound healing.  Reviewed with patient importance of keeping a medication schedule and plan for any missed doses. No barriers to medication adherence identified.  Medication reconciliation performed and medication/allergy list updated.  All questions answered.  Mr. Oravec voiced understanding and appreciation.   Medication education handout and medication calender for first cycle given to  patient. Patient knows to call the office with questions or concerns. Oral Chemotherapy Clinic phone number provided to patient.   Jude Norton, PharmD, BCPS, BCOP Hematology/Oncology Clinical Pharmacist [facility] Oral Chemotherapy Navigation Clinics [phone] 03/24/2024 2:38 PM

## 2024-03-28 ENCOUNTER — Encounter: Payer: Self-pay | Admitting: Family Medicine

## 2024-03-28 ENCOUNTER — Other Ambulatory Visit: Payer: Self-pay

## 2024-03-28 DIAGNOSIS — Z515 Encounter for palliative care: Secondary | ICD-10-CM

## 2024-03-28 DIAGNOSIS — C7800 Secondary malignant neoplasm of unspecified lung: Secondary | ICD-10-CM

## 2024-03-28 DIAGNOSIS — G893 Neoplasm related pain (acute) (chronic): Secondary | ICD-10-CM

## 2024-03-28 DIAGNOSIS — C20 Malignant neoplasm of rectum: Secondary | ICD-10-CM

## 2024-03-28 MED ORDER — OXYCODONE HCL 5 MG PO TABS
5.0000 mg | ORAL_TABLET | Freq: Four times a day (QID) | ORAL | 0 refills | Status: DC | PRN
Start: 2024-03-28 — End: 2024-04-10

## 2024-04-01 NOTE — Telephone Encounter (Signed)
 Oral Oncology Patient Advocate Encounter  Called pt's daughter Amalia Badder, to check status of the Medicare Extra Help. She logged into the Medicaid site to see that it as well as Medicare both say pending and the Medicaid application may take up to 30 days for review. It is dated as received 03/21/24.

## 2024-04-07 ENCOUNTER — Encounter: Payer: Self-pay | Admitting: Hematology

## 2024-04-07 ENCOUNTER — Inpatient Hospital Stay: Attending: Physician Assistant

## 2024-04-07 ENCOUNTER — Inpatient Hospital Stay: Admitting: Hematology

## 2024-04-07 ENCOUNTER — Encounter: Payer: Self-pay | Admitting: Nurse Practitioner

## 2024-04-07 ENCOUNTER — Inpatient Hospital Stay (HOSPITAL_BASED_OUTPATIENT_CLINIC_OR_DEPARTMENT_OTHER): Admitting: Nurse Practitioner

## 2024-04-07 VITALS — BP 140/80 | HR 75 | Temp 97.4°F | Resp 18 | Ht 72.0 in | Wt 143.5 lb

## 2024-04-07 DIAGNOSIS — C20 Malignant neoplasm of rectum: Secondary | ICD-10-CM

## 2024-04-07 DIAGNOSIS — M792 Neuralgia and neuritis, unspecified: Secondary | ICD-10-CM | POA: Diagnosis not present

## 2024-04-07 DIAGNOSIS — C7801 Secondary malignant neoplasm of right lung: Secondary | ICD-10-CM | POA: Insufficient documentation

## 2024-04-07 DIAGNOSIS — Z79899 Other long term (current) drug therapy: Secondary | ICD-10-CM | POA: Diagnosis not present

## 2024-04-07 DIAGNOSIS — C7800 Secondary malignant neoplasm of unspecified lung: Secondary | ICD-10-CM | POA: Diagnosis not present

## 2024-04-07 DIAGNOSIS — Z515 Encounter for palliative care: Secondary | ICD-10-CM

## 2024-04-07 DIAGNOSIS — Z95828 Presence of other vascular implants and grafts: Secondary | ICD-10-CM

## 2024-04-07 DIAGNOSIS — G893 Neoplasm related pain (acute) (chronic): Secondary | ICD-10-CM | POA: Diagnosis not present

## 2024-04-07 DIAGNOSIS — C775 Secondary and unspecified malignant neoplasm of intrapelvic lymph nodes: Secondary | ICD-10-CM | POA: Diagnosis not present

## 2024-04-07 DIAGNOSIS — R530 Neoplastic (malignant) related fatigue: Secondary | ICD-10-CM | POA: Diagnosis not present

## 2024-04-07 LAB — CBC WITH DIFFERENTIAL (CANCER CENTER ONLY)
Abs Immature Granulocytes: 0.01 10*3/uL (ref 0.00–0.07)
Basophils Absolute: 0 10*3/uL (ref 0.0–0.1)
Basophils Relative: 0 %
Eosinophils Absolute: 0 10*3/uL (ref 0.0–0.5)
Eosinophils Relative: 1 %
HCT: 42.8 % (ref 39.0–52.0)
Hemoglobin: 13.9 g/dL (ref 13.0–17.0)
Immature Granulocytes: 0 %
Lymphocytes Relative: 34 %
Lymphs Abs: 0.9 10*3/uL (ref 0.7–4.0)
MCH: 30.4 pg (ref 26.0–34.0)
MCHC: 32.5 g/dL (ref 30.0–36.0)
MCV: 93.7 fL (ref 80.0–100.0)
Monocytes Absolute: 0.5 10*3/uL (ref 0.1–1.0)
Monocytes Relative: 19 %
Neutro Abs: 1.2 10*3/uL — ABNORMAL LOW (ref 1.7–7.7)
Neutrophils Relative %: 46 %
Platelet Count: 124 10*3/uL — ABNORMAL LOW (ref 150–400)
RBC: 4.57 MIL/uL (ref 4.22–5.81)
RDW: 16.9 % — ABNORMAL HIGH (ref 11.5–15.5)
WBC Count: 2.7 10*3/uL — ABNORMAL LOW (ref 4.0–10.5)
nRBC: 0 % (ref 0.0–0.2)

## 2024-04-07 LAB — CMP (CANCER CENTER ONLY)
ALT: 20 U/L (ref 0–44)
AST: 26 U/L (ref 15–41)
Albumin: 3.3 g/dL — ABNORMAL LOW (ref 3.5–5.0)
Alkaline Phosphatase: 129 U/L — ABNORMAL HIGH (ref 38–126)
Anion gap: 3 — ABNORMAL LOW (ref 5–15)
BUN: 12 mg/dL (ref 8–23)
CO2: 31 mmol/L (ref 22–32)
Calcium: 8.3 mg/dL — ABNORMAL LOW (ref 8.9–10.3)
Chloride: 102 mmol/L (ref 98–111)
Creatinine: 0.81 mg/dL (ref 0.61–1.24)
GFR, Estimated: >60 mL/min (ref >60)
Glucose, Bld: 108 mg/dL — ABNORMAL HIGH (ref 70–99)
Potassium: 3.9 mmol/L (ref 3.5–5.1)
Sodium: 136 mmol/L (ref 135–145)
Total Bilirubin: 0.4 mg/dL (ref 0.0–1.2)
Total Protein: 6.8 g/dL (ref 6.5–8.1)

## 2024-04-07 LAB — CEA (ACCESS): CEA (CHCC): 271.21 ng/mL — ABNORMAL HIGH (ref 0.00–5.00)

## 2024-04-07 LAB — TOTAL PROTEIN, URINE DIPSTICK: Protein, ur: 300 mg/dL — AB

## 2024-04-07 MED ORDER — SODIUM CHLORIDE 0.9% FLUSH
10.0000 mL | Freq: Once | INTRAVENOUS | Status: AC
  Administered 2024-04-07: 10 mL

## 2024-04-07 MED ORDER — HEPARIN SOD (PORK) LOCK FLUSH 100 UNIT/ML IV SOLN
500.0000 [IU] | Freq: Once | INTRAVENOUS | Status: AC
  Administered 2024-04-07: 500 [IU]

## 2024-04-07 NOTE — Progress Notes (Signed)
 Palliative Medicine [facility]  [phone] [phone]   Name: Jorge [name] Date: 04/07/2024 MRN: [id number]  DOB: 03/03/1951  Patient Care Team: Melodie Spry, NP as PCP - General (Family Medicine) Croitoru, Karyl Paget, MD as PCP - Cardiology (Cardiology) Pickenpack-Cousar, Giles Labrum, NP as Nurse Practitioner (Nurse Practitioner) Sonja Candelero [place], MD as Consulting Physician (Hematology) Adriana Albany, LCSW as VBCI Care Management (Licensed Clinical Social Worker) Adriana Albany, LCSW (Licensed Clinical Social Worker)   INTERVAL HISTORY: Jorge [name] is a 73 y.o. 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  Palliative ask to see for symptom management and goals of care.   SOCIAL HISTORY:     reports that he quit smoking about 6 months ago. His smoking use included cigarettes. He has a 5 pack-year smoking history. He has never used smokeless tobacco. He reports that he does not currently use alcohol. He reports that he does not use drugs.  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  MEDICATIONS:  Current Outpatient Medications  Medication Sig Dispense Refill   amLODipine  (NORVASC ) 10 MG tablet Take 1 tablet (10 mg total) by mouth daily. 90 tablet 1   diphenoxylate -atropine  (LOMOTIL ) 2.5-0.025 MG tablet Take 1-2 tablets by mouth 4 (four) times daily as needed for diarrhea or loose stools. 90 tablet 1   gabapentin  (NEURONTIN ) 100 MG capsule Take 2 capsules (200 mg total) by mouth 3 (three) times daily. 180 capsule 2   metoprolol  tartrate (LOPRESSOR ) 25 MG tablet Take 1 tablet (25 mg total) by mouth 2 (two) times daily. 60 tablet 0   Multiple Vitamin (MULTIVITAMIN WITH MINERALS) TABS  tablet Take 1 tablet by mouth daily.     ondansetron  (ZOFRAN ) 8 MG tablet Take 1 tablet (8 mg total) by mouth every 8 (eight) hours as needed for nausea or vomiting. 30 tablet 3   oxyCODONE  (OXY IR/ROXICODONE ) 5 MG immediate release tablet Take 1 tablet (5 mg total) by mouth every 6 (six) hours as needed for severe pain (pain score 7-10) or breakthrough pain. 60 tablet 0   pantoprazole  (PROTONIX ) 40 MG tablet Take 1 tablet (40 mg total) by mouth daily before breakfast. 90 tablet 0   prochlorperazine  (COMPAZINE ) 10 MG tablet Take 1 tablet (10 mg total) by mouth every 6 (six) hours as needed (Nausea or vomiting). 30 tablet 1   Pseudoephedrine-APAP-DM (DAYQUIL PO) Take 1 tablet by mouth as needed.     regorafenib  (STIVARGA ) 40 MG tablet Week 1: Take 2 tablets (80 mg) po every day for 1 week;  Then Week 2: Take 3 tablets (120 mg) po every day for 1 week; Then Week 3: Take 4 tablets (160 mg) po every day for 1 week; Then Week 4: no medication for 1 week. 63 tablet 0   No current facility-administered medications for this visit.    VITAL SIGNS: There were no vitals taken for this visit. There were no vitals filed for this visit.   Estimated body mass index is 19.46 kg/m as calculated from the following:   Height as of an earlier encounter on 04/07/24: 6' (1.829 m).   Weight as of an earlier encounter on 04/07/24: 143 lb 8 oz (65.1 kg).   PERFORMANCE  STATUS (ECOG) : 1 - Symptomatic but completely ambulatory  IMPRESSION:  Jorge [name] presents to clinic today for symptom management follow-up.  No acute distress noted.  Continues to remain as active as possible. Occasional fatigue.  Reports his appetite fluctuates.  Some days are better than others but feels that it has much improved.  Current weight is stable at 143lbs. Denies concerns with nausea, vomiting, or constipation.  Loose stools currently managed with Lomotil  as needed. Not occurring as often.   We discussed his pain at length.  Jorge [name]  reports pain is well-controlled on current regimen however does have some worsening neuropathic discomfort. His current regimen consist of gabapentin  200 mg 3 times daily and oxycodone  5 mg every 6 hours as needed. Advised to increase gabapentin  to 300 mg 3 times a day.  He verbalized understanding.  Will continue to closely monitor.  All questions answered and support provided.   PLAN:  Continue gabapentin  to 300mg  three times daily.   Oxy IR 5mg  every 4-6 hours as needed for severe pain over the next 7 days. Will send in refill as appropriate. I will plan to see patient back in 4-6 weeks in collaboration with his oncology appointments.   Patient expressed understanding and was in agreement with this plan. He also understands that He can call the clinic at any time with any questions, concerns, or complaints.     Any controlled substances utilized were prescribed in the context of palliative care. PDMP has been reviewed.    Visit consisted of counseling and education dealing with the complex and emotionally intense issues of symptom management and palliative care in the setting of serious and potentially life-threatening illness.  [name], AGPCNP-BC  Palliative Medicine Team/[facility]

## 2024-04-07 NOTE — Progress Notes (Addendum)
 [facility]   [phone] Fax:([phone]) [phone]   Clinic Follow up Note   Patient Care Team: Petrina Pries, NP as PCP - General (Family Medicine) Croitoru, Jerel, MD as PCP - Cardiology (Cardiology) Pickenpack-Cousar, Fannie SAILOR, NP as Nurse Practitioner (Nurse Practitioner) Lanny Callander, MD as Consulting Physician (Hematology) Ezzard Rolin BIRCH, LCSW as VBCI Care Management (Licensed Clinical Social Worker) Ezzard Rolin BIRCH, LCSW (Licensed Clinical Social Worker)  Date of Service:  04/07/2024  CHIEF COMPLAINT: f/u of static rectal cancer  CURRENT THERAPY:  Stivarga  160 mg daily (sample pills)   Oncology History   Rectal cancer (HCC) [phone] with lung metastasis, MMR normal, KRAS(+) [id number]  -diagnosed 08/2021 by colonoscopy for rectal bleeding and frequent BM. Staging MRI showed early extra mesorectal lymph node involvement.  -baseline CEA 571.84 on 09/06/21.  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 Xeloda  dose reduced due to elevated liver enzymes.  -due to new liver lesions, we switched to second-line FOLFIRI, with continued beva, on 02/21/22. He tolerates well overall.  - restaging CT scan on April 22 and June 26 showed stable disease overall. -CT 10/09/2023 showed mild disease progression in lungs, his tumor marker CEA has been going up also.  -I changed his chemo to lonsurf  and beva, he started on 12/13/2023, unfortunately progressed on CT 02/25/2024 -treatment changed to Stivarga . He required drug assistance    Assessment & Plan Metastatic colon cancer Metastatic colon cancer with ongoing treatment using Stivarga  (regorafenib ) for approximately two weeks, taking two pills daily. No reported side effects such as rash, diarrhea, or nausea. Blood counts are slightly low, but renal and hepatic functions are well-managed.  Tumor markers are pending. The cancer is incurable, and treatment options are limited. He is aware of the prognosis, with a life expectancy of several months to less than a year. He has exceeded average survival, having been under care for almost three years. - Continue Stivarga  (regorafenib ) as prescribed. - Await results of tumor markers. - Discuss drug assistance program for Stivarga  with his daughter. - Consider another line of treatment if progression occurs.  Goals of Care, DNR Discussed the importance of establishing a living will and Do Not Resuscitate (DNR) status due to the incurable nature of the cancer. He agrees with DNR and prefers not to be resuscitated if cardiac or respiratory arrest occurs. He wishes to receive medical treatment if it could potentially improve his condition but prefers to let go if in a non-responsive state in the hospital. Encouraged discussion with his daughter regarding these decisions. He understands the prognosis and anticipates needing family support as his condition progresses. - Document his agreement with DNR in the medical chart. - Encourage discussion of goals of care and DNR preferences with his daughter.  Plan - he is tolerating low-dose Stivarga  160 mg daily well, will continue, he is using the sample from our office. HIS daughter is working on her enrollment to Medicare part D -lab and f/u in 4 weeks  -I did his ACP and he agreed with DNR      SUMMARY OF ONCOLOGIC HISTORY: Oncology History Overview Note  Cancer Staging Rectal cancer [facility]) Staging form: Colon and Rectum, AJCC 8th Edition - Clinical stage from 08/29/2021: Jorge [name], cM1 - Signed by Lanny Callander, MD on 08/29/2021    Rectal cancer (HCC)  07/23/2021 Imaging   CT AP  IMPRESSION: 1. Appearance  of the rectum likely indicates a rectal mass suspicious for rectal carcinoma. Inflammatory process would be less likely. Direct visualization is suggested. 2. Cholelithiasis without  evidence of acute cholecystitis. 3. Aortic atherosclerosis.   08/19/2021 Procedure   Colonoscopy, under Dr. Wilhelmenia  Impression: - Rectal tenderness, palpable rectal mass and hemorrhoids found on digital rectal exam. - Stool in the entire examined colon - lavaged with still inadequate clearance. - One 35 mm polyp at the hepatic flexure. Biopsied. Tattooed distal in case future endoscopic resection is considered - however, has larger issues with rectal mass currently as below. - Four, 5 to 11 mm polyps in the transverse colon and at the hepatic flexure, removed with a cold snare. Resected and retrieved. - Diverticulosis in the recto-sigmoid colon and in the sigmoid colon. - Rule out malignancy, partially obstructing tumor in the rectum and from 6 to 13 cm proximal to the anus. Biopsied.   08/19/2021 Pathology Results   Diagnosis 1. Hepatic Flexure Biopsy - TUBULAR ADENOMA WITHOUT HIGH-GRADE DYSPLASIA OR MALIGNANCY 2. Transverse Colon Biopsy, and hepatic flexure, polyps (4) - TUBULAR ADENOMA WITHOUT HIGH-GRADE DYSPLASIA OR MALIGNANCY - OTHER FRAGMENTS OF POLYPOID COLONIC MUCOSA WITH NO SPECIFIC HISTOPATHOLOGIC CHANGES - FOOD MATERIAL 3. Rectum, biopsy - ADENOCARCINOMA. SEE NOTE   08/22/2021 Initial Diagnosis   Rectal cancer (HCC)   08/26/2021 Imaging   IMPRESSION: 1. A 2.2 x 1.9 cm right middle lobe pulmonary nodule as well as a total of four right upper lobe pulmonary nodule and masses measuring up to 3.6 cm. Findings concerning for metastatic primary lung cancer versus less likely metastases in a patient with rectal cancer. Additional imaging evaluation or consultation with Pulmonology or Thoracic Surgery recommended. 2. No gross hilar adenopathy, noting limited sensitivity for the detection of hilar adenopathy on this noncontrast study. 3. Cholelithiasis. 4.  Emphysema (ICD10-J43.9). 5. At least left anterior descending coronary artery calcifications.   08/27/2021 Imaging    IMPRESSION: Rectal adenocarcinoma T stage: T4 B   Rectal adenocarcinoma N stage: N2 disease likely associated with early extra mesorectal lymph node involvement.   Distance from tumor to the internal anal sphincter is 1.2 cm.   Also with extramural venous involvement as described.   08/29/2021 Cancer Staging   Staging form: Colon and Rectum, AJCC 8th Edition - Clinical stage from 08/29/2021: Jorge [name], cM1 - Signed by Lanny Callander, MD on 08/29/2021 Stage prefix: Initial diagnosis   01/23/2022 Imaging   CT CAP w contrast IMPRESSION: 1. Mild interval decrease in size of multiple right-sided pulmonary nodules. No new suspicious pulmonary nodule or mass. 2. Interval development of approximately 10 new ill-defined hypoattenuating liver lesions ranging in size from approximately 5-10 mm. Imaging features suspicious for metastatic disease. MRI abdomen with and without contrast may prove helpful to further evaluate. 3. Similar appearance of wall thickening in the rectum with perirectal edema. 4. Cholelithiasis. 5. Prostatomegaly. 6. Aortic Atherosclerosis (ICD10-I70.0).   11/21/2022 Imaging    IMPRESSION: 1. Stable exam. No new or progressive interval findings. 2. The primary rectosigmoid lesion described previously is not well seen on the current study. There is presacral and perirectal edema, likely treatment related. 3. No substantial change in appearance of bilateral pulmonary metastases. 4. Stable tiny hypodensities in both hepatic lobes. These were previously characterized as metastatic disease. No new liver lesions on today's exam. 5. Cholelithiasis. 6. Emphysema (ICD10-J43.9) and Aortic Atherosclerosis (ICD10-170.0)     02/25/2024 Imaging   CT Chest, Abdomen, and Pelvis with contrast  IMPRESSION: Multiple lung nodules are  again identified. These are increased in size from previous.   Previous nodes however in the mediastinum and right lung hilum are improving.   Fatty  infiltration with multiple low-attenuation liver lesions. Many these are slightly larger but more low in density today. Dilated gallbladder with stones.   Persistent wall thickening and nodularity along the rectum with adjacent stranding and fluid. Please correlate with exact location of neoplasm.   Overall mixed response.       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 These are increased in size from previous.  Previous nodes however in the mediastinum and right lung hilum are improving.   Fatty infiltration with multiple low-attenuation liver lesions. Many these are slightly larger but more low in density today.   Dilated gallbladder with stones. Persistent wall thickening and nodularity along the rectum with adjacent stranding and fluid. Please correlate with exact location of neoplasm.   Overall mixed response.          Discussed the use of AI scribe software for clinical note transcription with the patient, who gave verbal consent to proceed.  History of Present Illness Jorge [name] is a 73 year old male with metastatic colon cancer who presents for follow-up. He is accompanied by his daughter, Jorge [name], who is actively involved in his care.  He has been on Stivarga  (regorafenib ) for two weeks, taking two pills daily without adverse effects  such as rash, diarrhea, or nausea. He finds the regimen manageable. Concerns exist about the cost and availability of the medication after the sample is depleted, with his daughter working on securing drug assistance. His overall health remains stable with no significant changes in energy levels or symptoms. He has a supportive family environment.     All other systems were reviewed with the patient and are negative.  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    SURGICAL HISTORY: Past Surgical History:  Procedure Laterality Date   BRONCHIAL BIOPSY  09/27/2021   Procedure: BRONCHIAL BIOPSIES;  Surgeon: Brenna Adine CROME, DO;  Location: [facility];  Service: Pulmonary;;   BRONCHIAL BRUSHINGS  09/27/2021   Procedure: BRONCHIAL BRUSHINGS;  Surgeon: Brenna Adine CROME, DO;  Location: [facility]OS[facility];  Service: Pulmonary;;   BRONCHIAL NEEDLE ASPIRATION BIOPSY  09/27/2021   Procedure: BRONCHIAL NEEDLE ASPIRATION BIOPSIES;  Surgeon: Brenna Adine CROME, DO;  Location: [facility] ENDOSCOPY;  Service: Pulmonary;;   COLONOSCOPY     IR IMAGING GUIDED PORT INSERTION  10/24/2021   left breast surgery Left 1968   VIDEO BRONCHOSCOPY WITH RADIAL ENDOBRONCHIAL ULTRASOUND  09/27/2021   Procedure: RADIAL ENDOBRONCHIAL ULTRASOUND;  Surgeon: Brenna Adine CROME, DO;  Location: [facility] ENDOSCOPY;  Service: Pulmonary;;    I have reviewed the social history and family history with  the patient and they are unchanged from previous note.  ALLERGIES:  has no known allergies.  MEDICATIONS:  Current Outpatient Medications  Medication Sig Dispense Refill   amLODipine  (NORVASC ) 10 MG tablet Take 1 tablet (10 mg total) by mouth daily. 90 tablet 1   diphenoxylate -atropine  (LOMOTIL ) 2.5-0.025 MG tablet Take 1-2 tablets by mouth 4 (four) times daily as needed for diarrhea or loose stools. 90 tablet 1   gabapentin  (NEURONTIN ) 100 MG  capsule Take 2 capsules (200 mg total) by mouth 3 (three) times daily. 180 capsule 2   metoprolol  tartrate (LOPRESSOR ) 25 MG tablet Take 1 tablet (25 mg total) by mouth 2 (two) times daily. 60 tablet 0   Multiple Vitamin (MULTIVITAMIN WITH MINERALS) TABS tablet Take 1 tablet by mouth daily.     ondansetron  (ZOFRAN ) 8 MG tablet Take 1 tablet (8 mg total) by mouth every 8 (eight) hours as needed for nausea or vomiting. 30 tablet 3   oxyCODONE  (OXY IR/ROXICODONE ) 5 MG immediate release tablet Take 1 tablet (5 mg total) by mouth every 6 (six) hours as needed for severe pain (pain score 7-10) or breakthrough pain. 60 tablet 0   pantoprazole  (PROTONIX ) 40 MG tablet Take 1 tablet (40 mg total) by mouth daily before breakfast. 90 tablet 0   prochlorperazine  (COMPAZINE ) 10 MG tablet Take 1 tablet (10 mg total) by mouth every 6 (six) hours as needed (Nausea or vomiting). 30 tablet 1   Pseudoephedrine-APAP-DM (DAYQUIL PO) Take 1 tablet by mouth as needed.     regorafenib  (STIVARGA ) 40 MG tablet Week 1: Take 2 tablets (80 mg) po every day for 1 week;  Then Week 2: Take 3 tablets (120 mg) po every day for 1 week; Then Week 3: Take 4 tablets (160 mg) po every day for 1 week; Then Week 4: no medication for 1 week. 63 tablet 0   No current facility-administered medications for this visit.    PHYSICAL EXAMINATION: ECOG PERFORMANCE STATUS: 1 - Symptomatic but completely ambulatory  Vitals:   04/07/24 1105  BP: (!) 140/80  Pulse: 75  Resp: 18  Temp: (!) 97.4 F (36.3 C)  SpO2: 95%   Wt Readings from Last 3 Encounters:  04/07/24 143 lb 8 oz (65.1 kg)  03/13/24 144 lb 14.4 oz (65.7 kg)  02/28/24 146 lb 8 oz (66.5 kg)     GENERAL:alert, no distress and comfortable SKIN: skin color, texture, turgor are normal, no rashes or significant lesions EYES: normal, Conjunctiva are pink and non-injected, sclera clear NECK: supple, thyroid normal size, non-tender, without nodularity LYMPH:  no palpable  lymphadenopathy in the cervical, axillary  LUNGS: clear to auscultation and percussion with normal breathing effort HEART: regular rate & rhythm and no murmurs and no lower extremity edema ABDOMEN:abdomen soft, non-tender and normal bowel sounds Musculoskeletal:no cyanosis of digits and no clubbing  NEURO: alert & oriented x 3 with fluent speech, no focal motor/sensory deficits  Physical Exam SKIN: No rash.  LABORATORY DATA:  I have reviewed the data as listed    Latest Ref Rng & Units 04/07/2024   10:30 AM 03/13/2024   12:26 PM 02/28/2024   12:41 PM  CBC  WBC 4.0 - 10.5 K/uL 2.7  2.4  2.3   Hemoglobin 13.0 - 17.0 g/dL 86.0  86.6  86.8   Hematocrit 39.0 - 52.0 % 42.8  39.5  39.2   Platelets 150 - 400 K/uL 124  151  160         Latest  Ref Rng & Units 04/07/2024   10:30 AM 03/13/2024   12:26 PM 02/28/2024   12:41 PM  CMP  Glucose 70 - 99 mg/dL 891  88  891   BUN 8 - 23 mg/dL 12  11  10    Creatinine 0.61 - 1.24 mg/dL 9.18  9.21  9.27   Sodium 135 - 145 mmol/L 136  136  138   Potassium 3.5 - 5.1 mmol/L 3.9  4.1  4.1   Chloride 98 - 111 mmol/L 102  102  104   CO2 22 - 32 mmol/L 31  33  31   Calcium  8.9 - 10.3 mg/dL 8.3  8.8  8.8   Total Protein 6.5 - 8.1 g/dL 6.8  6.8  6.9   Total Bilirubin 0.0 - 1.2 mg/dL 0.4  0.6  0.4   Alkaline Phos 38 - 126 U/L 129  85  111   AST 15 - 41 U/L 26  19  21    ALT 0 - 44 U/L 20  12  12        RADIOGRAPHIC STUDIES: I have personally reviewed the radiological images as listed and agreed with the findings in the report. No results found.    Orders Placed This Encounter  Procedures   Do not attempt resuscitation (DNR)    If patient has no pulse and is not breathing:   Do Not Attempt Resuscitation    If patient has a pulse and/or is breathing: Medical Treatment Goals:   LIMITED ADDITIONAL INTERVENTIONS: Use medication/IV fluids and cardiac monitoring as indicated; Do not use intubation or mechanical ventilation (DNI), also provide comfort  medications.  Transfer to Progressive/Stepdown as indicated, avoid Intensive Care.    Consent::   Discussion documented in EHR or advanced directives reviewed   All questions were answered. The patient knows to call the clinic with any problems, questions or concerns. No barriers to learning was detected. The total time spent in the appointment was 40 minutes, including review of chart and various tests results, discussions about plan of care and coordination of care plan     Onita Mattock, MD 04/07/2024

## 2024-04-07 NOTE — Patient Instructions (Signed)

## 2024-04-07 NOTE — Telephone Encounter (Signed)
 Oral Oncology Patient Advocate Encounter  Followed up with pt's daughter, Amalia Badder. She still hasn't heard anything from [facility] or Medicaid. She did log into the Medicaid site while I was on the phone and it still say spending. She checked both Medicare and Medicaid on Friday and neither had any information.   She will contact us  once she gets a response, or We'll follow up in another week.

## 2024-04-07 NOTE — Assessment & Plan Note (Signed)
 [id number] with lung metastasis, MMR normal, KRAS(+) [id number]  -diagnosed 08/2021 by colonoscopy for rectal bleeding and frequent BM. Staging MRI showed early extra mesorectal lymph node involvement.  -baseline CEA 571.84 on 09/06/21.  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 Xeloda  dose reduced due to elevated liver enzymes.  -due to new liver lesions, we switched to second-line FOLFIRI, with continued beva, on 02/21/22. He tolerates well overall.  - restaging CT scan on April 22 and June 26 showed stable disease overall. -CT 10/09/2023 showed mild disease progression in lungs, his tumor marker CEA has been going up also.  -I changed his chemo to lonsurf  and beva, he started on 12/13/2023, unfortunately progressed on CT 02/25/2024 -treatment changed to Stivarga . He required drug assistance

## 2024-04-10 ENCOUNTER — Telehealth: Payer: Self-pay

## 2024-04-10 ENCOUNTER — Other Ambulatory Visit: Payer: Self-pay

## 2024-04-10 DIAGNOSIS — C7800 Secondary malignant neoplasm of unspecified lung: Secondary | ICD-10-CM

## 2024-04-10 DIAGNOSIS — Z515 Encounter for palliative care: Secondary | ICD-10-CM

## 2024-04-10 DIAGNOSIS — G893 Neoplasm related pain (acute) (chronic): Secondary | ICD-10-CM

## 2024-04-10 MED ORDER — OXYCODONE HCL 5 MG PO TABS: 5.0000 mg | ORAL_TABLET | Freq: Four times a day (QID) | ORAL | 0 refills | Status: DC | PRN

## 2024-04-10 NOTE — Progress Notes (Signed)
 Complex Care Management Care Guide Note  04/10/2024 Name: Jorge [name] MRN: [id number] DOB: Jun 18, 1951  Jorge [name] is a 73 y.o. year old male who is a primary care patient of Melodie Spry, NP and is actively engaged with the care management team. I reached out to Corlis Dienes by phone today to assist with re-scheduling  with the RN Case Manager.  Follow up plan: Telephone appointment with complex care management team member scheduled for:  05-15-24  Creola Doheny [facility], [facility] Guide  Direct Dial: [phone]  Fax [phone]

## 2024-04-11 ENCOUNTER — Other Ambulatory Visit: Payer: Self-pay | Admitting: Licensed Clinical Social Worker

## 2024-04-15 ENCOUNTER — Other Ambulatory Visit (HOSPITAL_COMMUNITY): Payer: Self-pay

## 2024-04-15 ENCOUNTER — Telehealth: Payer: Self-pay

## 2024-04-15 ENCOUNTER — Encounter: Payer: Self-pay | Admitting: Family Medicine

## 2024-04-15 NOTE — Telephone Encounter (Signed)
 See patient message to provider in last encounter.   Called and spoke with patient's daughter Amalia Badder in regards to [facility] Extra Help approval. Amalia Badder is going to send a copy of approval letter to me at [email or web address]. Once I have the copy I will find out what needs to be done regarding filling medication before August 1st, 2025. Oral Oncology Clinic will continue to follow and update until final determination.   Of note, pharmacist is also in contact with representative from [employer] to also see about medication access as we have been waiting on Medicare Extra Help to finalize processing of PAP application.    Hansel Ley, CPhT Supervisor Pharmacy Patient Advocate [facility] [phone] (Ph) 04/15/2024 8:39 AM

## 2024-04-15 NOTE — Telephone Encounter (Signed)
 See encounters from 04/15/24 for updated notes.    Hansel Ley, CPhT Supervisor Pharmacy Patient Advocate [facility] [phone] (Ph) 04/15/2024 9:07 AM

## 2024-04-15 NOTE — Telephone Encounter (Signed)
 Oral Chemotherapy Pharmacist Encounter   Received call from [employer], [place], and was informed they will conditionally approve patient for Stivarga  assistance at this time while patient's medicare D is sorted out.   I called and spoke with patient's daughter Amalia Badder and informed her of the above. She knows to be expecting a call from [employer] today to set up shipment of Stivarga  to patient.   Of note, patient is on cycle 1, day 22 of Stivarga . He is due to start next cycle of Stivarga  on 04/22/24. Will follow up with APPs to see if they would like to dose escalate patient as he has tolerated 80 mg daily during this first cycle (patient's goal dose will eventually be 160 mg/d if tolerated).   Jude Norton, PharmD, BCPS, BCOP Hematology/Oncology Clinical Pharmacist [facility] and [facility] Oral Chemotherapy Navigation Clinics [phone] 04/15/2024 9:04 AM

## 2024-04-16 ENCOUNTER — Other Ambulatory Visit: Payer: Self-pay

## 2024-04-16 NOTE — Progress Notes (Signed)
 Received email message from [facility] with [facility] Medicine regarding pt's FO Liquid CD x stating the following.  I am reaching out regarding patient Jorge [name] (DOB [birth year]). [employer] Liquid CDx completed for this patient; however, we have been working with [facility] to retrieve the specimen to complete FOLR1 and Her2 testing. Per Alston Jerry at the path lab, this specimen is to be marked as "lost" as it was sent to an outside facility in 2022 and after investigating, the material never made it to the outside facility in 2022 and cannot be located. I apologize for the inconvenience. Do you have an alternate tissue specimen we can request?  Reviewed pt's chart to see if another surgical sample is available.  Unfortunately, no other surgical sample was seen.  Replied stating the only sample this nurse sees is a cytology sample which will probably be insufficient.

## 2024-04-17 ENCOUNTER — Other Ambulatory Visit: Payer: Self-pay

## 2024-04-17 ENCOUNTER — Telehealth: Payer: Self-pay | Admitting: Pharmacist

## 2024-04-17 NOTE — Telephone Encounter (Signed)
 Oral Oncology Pharmacist Encounter  Called and spoke with patient's daughter Patsi Boots delivered Stivarga  to patient's home on 04/16/24. Patient was sent #62 tablets of Stivarga  40 mg.   Spoke with Rande Bushy, NP and OK for patient to dose escalate to 3 tablets (120 mg total) by mouth daily for 21 days on, 7 days off. Repeat every 28 days. Patient will start cycle 2 on 04/22/24.  If patient is tolerating the 120 mg daily dose level during cycle 2, a new prescription will need to be sent to [facility] for quantity #84 for cycle 3 so that patient has enough medication on hand to dose escalate up to goal dose of 160 mg daily thereafter.  Jude Norton, PharmD, BCPS, BCOP Hematology/Oncology Clinical Pharmacist [facility] and [facility] Oral Chemotherapy Navigation Clinics [phone] 04/17/2024 9:31 AM

## 2024-04-17 NOTE — Telephone Encounter (Addendum)
 Received notification from [employer] representative, that they approved application conditionally and that medication was delivered to patient on 04/16/24. Patient should be able to receive medication from [facility] until patient's Medicare Part D (with Extra Help) goes into effect on August 1st, 2025.   Reminder has been set for Patient Advocate to run test claim Stivarga  and call patient to [facility] to [facility] on Friday, 06/06/2024.     Hansel Ley, CPhT Supervisor Pharmacy Patient Advocate [facility] Pharmacy Services [phone] (Ph) 04/17/2024 7:44 AM

## 2024-04-21 ENCOUNTER — Telehealth: Payer: Self-pay | Admitting: Pharmacy Technician

## 2024-04-21 NOTE — Patient Instructions (Signed)
 Visit Information  Thank you for taking time to visit with me today. Please don't hesitate to contact me if I can be of assistance to you before our next scheduled appointment.   Closing From: Complex Care Management.  Please call the care guide team at [phone] if you need to cancel, schedule, or reschedule an appointment.   Please call the Suicide and Crisis Lifeline: 988 go to [facility] [address] [place] [phone]) call 911 if you are experiencing a Mental Health or Behavioral Health Crisis or need someone to talk to.  Alease Hunter, LCSW [facility], [facility] Clinical Social Worker Direct Dial: [phone]  Fax: [phone] Website: [email or web address] 9:04 AM

## 2024-04-21 NOTE — Telephone Encounter (Addendum)
 Oral Oncology Patient Advocate Encounter  Received call from Lauren at [facility] Patient Assistance. They need a new prescription sent to [facility] for a 1 month supply, for July. - also received a fax. Saved to pt's chart.  His insurance will start in August and we'll be able to fill his medication Then.   Roda Cirri, CPhT Specialty Pharmacy Patient Advocate Phone: [phone] Fax: [phone]

## 2024-04-21 NOTE — Patient Outreach (Signed)
 Complex Care Management   Visit Note  04/11/2024  Name:  Jorge [name] MRN: [id number] DOB: 1951-08-04  Situation: Referral received for Complex Care Management related to Cancer I obtained verbal consent from Caregiver.  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    Assessment: Patient Reported Symptoms:  Cognitive Cognitive Status: Alert and oriented to person, place, and time Cognitive/Intellectual Conditions Management [RPT]: None reported or documented in medical history or problem list      Neurological Neurological Review of Symptoms: No symptoms reported    HEENT HEENT Symptoms Reported: No symptoms reported      Cardiovascular Cardiovascular Symptoms Reported: No symptoms reported    Respiratory Respiratory Symptoms Reported: No symptoms reported    Endocrine Patient reports the following symptoms related to hypoglycemia or hyperglycemia : No symptoms reported    Gastrointestinal Gastrointestinal Symptoms Reported: Not assessed      Genitourinary Genitourinary Symptoms Reported: Not assessed    Integumentary Integumentary Symptoms Reported: Not assessed    Musculoskeletal Musculoskelatal Symptoms Reviewed: No symptoms reported        Psychosocial Psychosocial Symptoms Reported: No symptoms reported Additional Psychological Details: Patient is doing well managing chronic health conditions and receives strong support from family.   Major Change/Loss/Stressor/Fears (CP): Medical condition, self Techniques to Cope with Loss/Stress/Change: Diversional activities, Medication Quality of Family Relationships: helpful, involved, supportive      12/14/2023    9:58 AM  Depression screen PHQ 2/9  Decreased Interest 0  Down, Depressed, Hopeless 0  PHQ - 2 Score 0  Altered sleeping 0  Tired, decreased energy 0   Change in appetite 0  Feeling bad or failure about yourself  0  Trouble concentrating 0  Moving slowly or fidgety/restless 0  Suicidal thoughts 0  PHQ-9 Score 0    There were no vitals filed for this visit.  Medications Reviewed Today     Reviewed by Calaya Gildner D, LCSW (Social Worker) on 04/21/24 at [phone]  Med List Status: <None>   Medication Order Taking? Sig Documenting Provider Last Dose Status Informant  amLODipine  (NORVASC ) 10 MG tablet [id number] No Take 1 tablet (10 mg total) by mouth daily. Sonja Pace, MD Taking Active   diphenoxylate -atropine  (LOMOTIL ) 2.5-0.025 MG tablet [id number] No Take 1-2 tablets by mouth 4 (four) times daily as needed for diarrhea or loose stools. Sharyon Deis, NP Taking Active   gabapentin  (NEURONTIN ) 100 MG capsule [id number] No Take 2 capsules (200 mg total) by mouth 3 (three) times daily. Sonja Marshall, MD Taking Active Family Member, Pharmacy Records  metoprolol  tartrate (LOPRESSOR ) 25 MG tablet [id number] No Take 1 tablet (25 mg total) by mouth 2 (two) times daily. Maggie Schooner, MD Taking Active   Multiple Vitamin (MULTIVITAMIN WITH MINERALS) TABS tablet [id number] No Take 1 tablet by mouth daily. [provider] Taking Active Family Member, Pharmacy Records  Discontinued 09/16/22 0914          Med Note Cindia Crease   Mon Oct 24, 2021  1:12 PM) Not started  ondansetron  (ZOFRAN ) 8 MG tablet [id number] No Take 1 tablet (8 mg total) by mouth every 8 (eight) hours as needed for nausea or vomiting. Sonja Independence, MD Taking Active Family Member, Pharmacy Records  oxyCODONE  (OXY IR/ROXICODONE ) 5 MG immediate release tablet [id number]  Take 1 tablet (5 mg total) by mouth  every 6 (six) hours as needed for severe pain (pain score 7-10) or breakthrough pain. Pickenpack-Cousar, Giles Labrum, NP  Active   pantoprazole  (PROTONIX ) 40 MG tablet [id number] No Take 1 tablet (40 mg total) by mouth daily before breakfast. Ariel Begun, Ankit C, MD Taking Active    prochlorperazine  (COMPAZINE ) 10 MG tablet [id number] No Take 1 tablet (10 mg total) by mouth every 6 (six) hours as needed (Nausea or vomiting). Sonja Otero, MD Taking Active Family Member, Pharmacy Records  Pseudoephedrine-APAP-[facility] PO) [id number] No Take 1 tablet by mouth as needed. [provider] Taking Active Family Member, Pharmacy Records  regorafenib  (STIVARGA ) 40 MG tablet [id number] No Week 1: Take 2 tablets (80 mg) po every day for 1 week;  Then Week 2: Take 3 tablets (120 mg) po every day for 1 week; Then Week 3: Take 4 tablets (160 mg) po every day for 1 week; Then Week 4: no medication for 1 week. Sharyon Deis, NP Taking Active   Med List Note [address] [facility] 04/17/24 0932): Stivarga  filled through [facility]            Recommendation:   Continue Current Plan of Care  Follow Up Plan:   Closing From:  Complex Care Management  Alease Hunter, LCSW   [facility], [facility] Clinical Social Worker Direct Dial: [phone]  Fax: [phone] Website: [email or web address] 9:04 AM

## 2024-04-22 ENCOUNTER — Encounter: Payer: Self-pay | Admitting: Hematology

## 2024-04-22 ENCOUNTER — Other Ambulatory Visit: Payer: Self-pay

## 2024-04-22 DIAGNOSIS — C20 Malignant neoplasm of rectum: Secondary | ICD-10-CM

## 2024-04-22 MED ORDER — REGORAFENIB 40 MG PO TABS: ORAL_TABLET | ORAL | 3 refills | Status: DC

## 2024-04-24 ENCOUNTER — Other Ambulatory Visit: Payer: Self-pay | Admitting: Hematology

## 2024-04-24 DIAGNOSIS — C20 Malignant neoplasm of rectum: Secondary | ICD-10-CM

## 2024-04-24 DIAGNOSIS — M792 Neuralgia and neuritis, unspecified: Secondary | ICD-10-CM

## 2024-04-24 DIAGNOSIS — G893 Neoplasm related pain (acute) (chronic): Secondary | ICD-10-CM

## 2024-04-24 NOTE — Telephone Encounter (Signed)
 Per OV, continue. Terrel Ferries, RN

## 2024-04-25 ENCOUNTER — Other Ambulatory Visit: Payer: Self-pay | Admitting: Nurse Practitioner

## 2024-04-25 DIAGNOSIS — C7800 Secondary malignant neoplasm of unspecified lung: Secondary | ICD-10-CM

## 2024-04-25 DIAGNOSIS — Z515 Encounter for palliative care: Secondary | ICD-10-CM

## 2024-04-25 DIAGNOSIS — G893 Neoplasm related pain (acute) (chronic): Secondary | ICD-10-CM

## 2024-04-25 DIAGNOSIS — C20 Malignant neoplasm of rectum: Secondary | ICD-10-CM

## 2024-04-25 MED ORDER — OXYCODONE HCL 5 MG PO TABS: 5.0000 mg | ORAL_TABLET | Freq: Four times a day (QID) | ORAL | 0 refills | Status: DC | PRN

## 2024-04-30 ENCOUNTER — Encounter: Payer: Self-pay | Admitting: Hematology

## 2024-05-04 NOTE — Assessment & Plan Note (Signed)
 [id number] with lung metastasis, MMR normal, KRAS(+) [id number]  -diagnosed 08/2021 by colonoscopy for rectal bleeding and frequent BM. Staging MRI showed early extra mesorectal lymph node involvement.  -baseline CEA 571.84 on 09/06/21.  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 Xeloda  dose reduced due to elevated liver enzymes.  -due to new liver lesions, we switched to second-line FOLFIRI, with continued beva, on 02/21/22. He tolerates well overall.  - restaging CT scan on April 22 and June 26 showed stable disease overall. -CT 10/09/2023 showed mild disease progression in lungs, his tumor marker CEA has been going up also.  -I changed his chemo to lonsurf  and beva, he started on 12/13/2023, unfortunately progressed on CT 02/25/2024 -treatment changed to Stivarga . He required drug assistance

## 2024-05-05 ENCOUNTER — Inpatient Hospital Stay (HOSPITAL_BASED_OUTPATIENT_CLINIC_OR_DEPARTMENT_OTHER): Admitting: Hematology

## 2024-05-05 ENCOUNTER — Inpatient Hospital Stay

## 2024-05-05 ENCOUNTER — Inpatient Hospital Stay (HOSPITAL_BASED_OUTPATIENT_CLINIC_OR_DEPARTMENT_OTHER): Admitting: Nurse Practitioner

## 2024-05-05 ENCOUNTER — Encounter: Payer: Self-pay | Admitting: Nurse Practitioner

## 2024-05-05 VITALS — BP 139/76 | HR 88 | Temp 98.0°F | Resp 17 | Ht 72.0 in | Wt 141.4 lb

## 2024-05-05 DIAGNOSIS — Z515 Encounter for palliative care: Secondary | ICD-10-CM

## 2024-05-05 DIAGNOSIS — C20 Malignant neoplasm of rectum: Secondary | ICD-10-CM | POA: Diagnosis not present

## 2024-05-05 DIAGNOSIS — M792 Neuralgia and neuritis, unspecified: Secondary | ICD-10-CM

## 2024-05-05 DIAGNOSIS — G893 Neoplasm related pain (acute) (chronic): Secondary | ICD-10-CM

## 2024-05-05 DIAGNOSIS — C7800 Secondary malignant neoplasm of unspecified lung: Secondary | ICD-10-CM

## 2024-05-05 LAB — CBC WITH DIFFERENTIAL (CANCER CENTER ONLY)
Abs Immature Granulocytes: 0.02 10*3/uL (ref 0.00–0.07)
Basophils Absolute: 0 10*3/uL (ref 0.0–0.1)
Basophils Relative: 1 %
Eosinophils Absolute: 0.2 10*3/uL (ref 0.0–0.5)
Eosinophils Relative: 4 %
HCT: 46.6 % (ref 39.0–52.0)
Hemoglobin: 15.1 g/dL (ref 13.0–17.0)
Immature Granulocytes: 0 %
Lymphocytes Relative: 19 %
Lymphs Abs: 1 10*3/uL (ref 0.7–4.0)
MCH: 30.4 pg (ref 26.0–34.0)
MCHC: 32.4 g/dL (ref 30.0–36.0)
MCV: 93.8 fL (ref 80.0–100.0)
Monocytes Absolute: 0.7 10*3/uL (ref 0.1–1.0)
Monocytes Relative: 13 %
Neutro Abs: 3.2 10*3/uL (ref 1.7–7.7)
Neutrophils Relative %: 63 %
Platelet Count: 176 10*3/uL (ref 150–400)
RBC: 4.97 MIL/uL (ref 4.22–5.81)
RDW: 15.1 % (ref 11.5–15.5)
WBC Count: 5.1 10*3/uL (ref 4.0–10.5)
nRBC: 0 % (ref 0.0–0.2)

## 2024-05-05 LAB — CMP (CANCER CENTER ONLY)
ALT: 18 U/L (ref 0–44)
AST: 27 U/L (ref 15–41)
Albumin: 3 g/dL — ABNORMAL LOW (ref 3.5–5.0)
Alkaline Phosphatase: 109 U/L (ref 38–126)
Anion gap: 4 — ABNORMAL LOW (ref 5–15)
BUN: 9 mg/dL (ref 8–23)
CO2: 31 mmol/L (ref 22–32)
Calcium: 8.6 mg/dL — ABNORMAL LOW (ref 8.9–10.3)
Chloride: 103 mmol/L (ref 98–111)
Creatinine: 0.95 mg/dL (ref 0.61–1.24)
GFR, Estimated: >60 mL/min (ref >60)
Glucose, Bld: 161 mg/dL — ABNORMAL HIGH (ref 70–99)
Potassium: 4 mmol/L (ref 3.5–5.1)
Sodium: 138 mmol/L (ref 135–145)
Total Bilirubin: 0.7 mg/dL (ref 0.0–1.2)
Total Protein: 6.8 g/dL (ref 6.5–8.1)

## 2024-05-05 LAB — CEA (ACCESS): CEA (CHCC): 305.7 ng/mL — ABNORMAL HIGH (ref 0.00–5.00)

## 2024-05-05 MED ORDER — OXYCODONE HCL 5 MG PO TABS: 5.0000 mg | ORAL_TABLET | Freq: Four times a day (QID) | ORAL | 0 refills | Status: DC | PRN

## 2024-05-05 NOTE — Progress Notes (Signed)
 [facility]   [phone] [phone]   Clinic Follow up Note   Patient Care Team: Petrina Pries, NP as PCP - General (Family Medicine) Croitoru, Jerel, MD as PCP - Cardiology (Cardiology) Pickenpack-Cousar, Fannie SAILOR, NP as Nurse Practitioner (Nurse Practitioner) Lanny Callander, MD as Consulting Physician (Hematology)  Date of Service:  05/05/2024  CHIEF COMPLAINT: f/u of rectal cancer  CURRENT THERAPY:  Fourth line Stivarga   Oncology History   Rectal cancer (HCC) [phone] with lung metastasis, MMR normal, KRAS(+) [id number]  -diagnosed 08/2021 by colonoscopy for rectal bleeding and frequent BM. Staging MRI showed early extra mesorectal lymph node involvement.  -baseline CEA 571.84 on 09/06/21.  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 Xeloda  dose reduced due to elevated liver enzymes.  -due to new liver lesions, we switched to second-line FOLFIRI, with continued beva, on 02/21/22. He tolerates well overall.  - restaging CT scan on April 22 and June 26 showed stable disease overall. -CT 10/09/2023 showed mild disease progression in lungs, his tumor marker CEA has been going up also.  -I changed his chemo to lonsurf  and beva, he started on 12/13/2023, unfortunately progressed on CT 02/25/2024 -treatment changed to Stivarga . He required drug assistance    Assessment & Plan Metastatic colon cancer Metastatic colon cancer with ongoing treatment using Stivarga  (regorafenib ). He is currently on a regimen of four tablets daily for 21 days followed by a 7-day break. He has tolerated the increased dosage well without significant side effects such as hand-foot syndrome. Tumor markers have shown a decrease, indicating a potential positive response to treatment. 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 If disease progression occurs, immunotherapy may be considered off-label. - Continue Stivarga  at four tablets daily for 21 days followed by a 7-day break. - Order tumor marker tests to monitor response to treatment. - Plan for a CT scan during the off week in August to assess disease status. - Schedule follow-up appointment in four weeks. - Discuss potential use of immunotherapy if disease progresses.  Pain management He reports improved pain control with increased gabapentin  dosage, now taking three tablets instead of two. He has no abdominal pain and is considering using Tylenol  PM as an alternative to oxycodone  for pain management. - Continue gabapentin  at increased dosage. - Consider Tylenol  PM for pain management, not exceeding two grams per day. - Discuss oxycodone  refill with nurse practitioner Levon.   Plan - He is tolerating full dose Stivarga  well, will continue this week, end of next week -He will start next cycle Stivarga  at 160 mg daily for 3 weeks on, 1 week off, in 2 weeks - Follow-up in 4 weeks  SUMMARY OF ONCOLOGIC HISTORY: Oncology History Overview Note  Cancer Staging Rectal cancer [facility]) Staging form: Colon and Rectum, AJCC 8th Edition - Clinical stage from 08/29/2021: Jorge [name], cM1 - Signed by Lanny Callander, MD on 08/29/2021    Rectal cancer (HCC)  07/23/2021 Imaging   CT AP  IMPRESSION: 1. Appearance of the rectum likely indicates a rectal mass suspicious for rectal carcinoma. Inflammatory process would be less likely. Direct visualization is suggested. 2. Cholelithiasis without evidence of acute cholecystitis. 3. Aortic atherosclerosis.   08/19/2021 Procedure   Colonoscopy, under Dr. Wilhelmenia  Impression: - Rectal tenderness, palpable rectal mass and hemorrhoids found on digital rectal exam. - Stool in the  entire examined colon - lavaged with still inadequate clearance. - One 35 mm polyp at the hepatic flexure. Biopsied. Tattooed  distal in case future endoscopic resection is considered - however, has larger issues with rectal mass currently as below. - Four, 5 to 11 mm polyps in the transverse colon and at the hepatic flexure, removed with a cold snare. Resected and retrieved. - Diverticulosis in the recto-sigmoid colon and in the sigmoid colon. - Rule out malignancy, partially obstructing tumor in the rectum and from 6 to 13 cm proximal to the anus. Biopsied.   08/19/2021 Pathology Results   Diagnosis 1. Hepatic Flexure Biopsy - TUBULAR ADENOMA WITHOUT HIGH-GRADE DYSPLASIA OR MALIGNANCY 2. Transverse Colon Biopsy, and hepatic flexure, polyps (4) - TUBULAR ADENOMA WITHOUT HIGH-GRADE DYSPLASIA OR MALIGNANCY - OTHER FRAGMENTS OF POLYPOID COLONIC MUCOSA WITH NO SPECIFIC HISTOPATHOLOGIC CHANGES - FOOD MATERIAL 3. Rectum, biopsy - ADENOCARCINOMA. SEE NOTE   08/22/2021 Initial Diagnosis   Rectal cancer (HCC)   08/26/2021 Imaging   IMPRESSION: 1. A 2.2 x 1.9 cm right middle lobe pulmonary nodule as well as a total of four right upper lobe pulmonary nodule and masses measuring up to 3.6 cm. Findings concerning for metastatic primary lung cancer versus less likely metastases in a patient with rectal cancer. Additional imaging evaluation or consultation with Pulmonology or Thoracic Surgery recommended. 2. No gross hilar adenopathy, noting limited sensitivity for the detection of hilar adenopathy on this noncontrast study. 3. Cholelithiasis. 4.  Emphysema (ICD10-J43.9). 5. At least left anterior descending coronary artery calcifications.   08/27/2021 Imaging   IMPRESSION: Rectal adenocarcinoma T stage: T4 B   Rectal adenocarcinoma N stage: N2 disease likely associated with early extra mesorectal lymph node involvement.   Distance from tumor to the internal anal sphincter is 1.2 cm.   Also with extramural venous involvement as described.   08/29/2021 Cancer Staging   Staging form: Colon and Rectum, AJCC 8th  Edition - Clinical stage from 08/29/2021: Jorge [name], cM1 - Signed by Lanny Callander, MD on 08/29/2021 Stage prefix: Initial diagnosis   01/23/2022 Imaging   CT CAP w contrast IMPRESSION: 1. Mild interval decrease in size of multiple right-sided pulmonary nodules. No new suspicious pulmonary nodule or mass. 2. Interval development of approximately 10 new ill-defined hypoattenuating liver lesions ranging in size from approximately 5-10 mm. Imaging features suspicious for metastatic disease. MRI abdomen with and without contrast may prove helpful to further evaluate. 3. Similar appearance of wall thickening in the rectum with perirectal edema. 4. Cholelithiasis. 5. Prostatomegaly. 6. Aortic Atherosclerosis (ICD10-I70.0).   11/21/2022 Imaging    IMPRESSION: 1. Stable exam. No new or progressive interval findings. 2. The primary rectosigmoid lesion described previously is not well seen on the current study. There is presacral and perirectal edema, likely treatment related. 3. No substantial change in appearance of bilateral pulmonary metastases. 4. Stable tiny hypodensities in both hepatic lobes. These were previously characterized as metastatic disease. No new liver lesions on today's exam. 5. Cholelithiasis. 6. Emphysema (ICD10-J43.9) and Aortic Atherosclerosis (ICD10-170.0)     02/25/2024 Imaging   CT Chest, Abdomen, and Pelvis with contrast  IMPRESSION: Multiple lung nodules are again identified. These are increased in size from previous.   Previous nodes however in the mediastinum and right lung hilum are improving.   Fatty infiltration with multiple low-attenuation liver lesions. Many these are slightly larger but more low in density today. Dilated gallbladder with stones.   Persistent wall thickening and nodularity along the rectum with adjacent stranding and  fluid. Please correlate with exact location of neoplasm.   Overall mixed response.       Rectal adenocarcinoma metastatic  to lung (HCC)  10/06/2021 Initial Diagnosis   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)   10/14/2021 - 01/25/2022 Chemotherapy   Patient is on Treatment Plan : COLORECTAL CapeOx + Bevacizumab  q21d     02/21/2022 - 06/29/2022 Chemotherapy   Patient is on Treatment Plan : COLORECTAL FOLFIRI / BEVACIZUMAB  Q14D     02/21/2022 - 11/16/2023 Chemotherapy   Patient is on Treatment Plan : COLORECTAL FOLFIRI + Bevacizumab  q14d     12/13/2023 - 01/31/2024 Chemotherapy   Patient is on Treatment Plan : COLORECTAL Bevacizumab  + Trifluridine /Tipiracil  q28d     02/25/2024 Imaging   CT CAP with contrast  IMPRESSION: Multiple lung nodules are again identified. These are increased in size from previous.  Previous nodes however in the mediastinum and right lung hilum are improving.   Fatty infiltration with multiple low-attenuation liver lesions. Many these are slightly larger but more low in density today.   Dilated gallbladder with stones. Persistent wall thickening and nodularity along the rectum with adjacent stranding and fluid. Please correlate with exact location of neoplasm.   Overall mixed response.          Discussed the use of AI scribe software for clinical note transcription with the patient, who gave verbal consent to proceed.  History of Present Illness Jorge [name] is a 73 year old male with metastatic colon cancer who presents for follow-up.  He is on Stivarga , currently taking four tablets daily, with a planned week off after this week. He started with two tablets daily in early May, then increased to three, and now four tablets. He has no abdominal pain. His gabapentin  dosage was increased from two to three tablets, improving his symptoms, but he is concerned about running out sooner. He inquires about using Tylenol  PM as an alternative to oxycodone , which he uses sparingly.     All other systems were reviewed with the patient and are negative.  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    SURGICAL HISTORY: Past Surgical History:  Procedure Laterality Date   BRONCHIAL BIOPSY  09/27/2021   Procedure: BRONCHIAL BIOPSIES;  Surgeon: Brenna Adine CROME, DO;  Location: [facility];  Service: Pulmonary;;   BRONCHIAL BRUSHINGS  09/27/2021   Procedure: BRONCHIAL BRUSHINGS;  Surgeon: Brenna Adine CROME, DO;  Location: [facility];  Service: Pulmonary;;   BRONCHIAL NEEDLE ASPIRATION BIOPSY  09/27/2021   Procedure: BRONCHIAL NEEDLE ASPIRATION BIOPSIES;  Surgeon: Brenna Adine CROME, DO;  Location: [facility];  Service: Pulmonary;;   COLONOSCOPY     IR IMAGING GUIDED PORT INSERTION  10/24/2021   left breast surgery Left 1968   VIDEO BRONCHOSCOPY WITH RADIAL ENDOBRONCHIAL ULTRASOUND  09/27/2021   Procedure: RADIAL ENDOBRONCHIAL ULTRASOUND;  Surgeon: Brenna Adine CROME, DO;  Location: [facility];  Service: Pulmonary;;    I have reviewed the social history and family history with the patient and they are unchanged from previous note.  ALLERGIES:  has no known allergies.  MEDICATIONS:  Current Outpatient Medications  Medication Sig Dispense Refill   amLODipine  (NORVASC ) 10 MG tablet Take 1 tablet (10 mg total) by mouth daily. 90 tablet 1   diphenoxylate -atropine  (LOMOTIL ) 2.5-0.025 MG tablet Take 1-2 tablets by mouth 4 (four) times daily as needed for diarrhea or loose stools. 90  tablet 1   gabapentin  (NEURONTIN ) 100 MG capsule TAKE 2 CAPSULES BY MOUTH 3 TIMES DAILY. 180 capsule 2   metoprolol  tartrate (LOPRESSOR ) 25 MG tablet Take 1 tablet (25 mg total) by mouth 2 (two) times daily. 60 tablet 0   Multiple Vitamin (MULTIVITAMIN WITH MINERALS) TABS tablet Take 1 tablet by mouth daily.     ondansetron  (ZOFRAN ) 8 MG tablet Take 1 tablet (8 mg total) by mouth every 8 (eight) hours as needed for nausea or vomiting. 30 tablet 3   [START ON 05/11/2024] oxyCODONE  (OXY IR/ROXICODONE )  5 MG immediate release tablet Take 1 tablet (5 mg total) by mouth every 6 (six) hours as needed for severe pain (pain score 7-10) or breakthrough pain. 60 tablet 0   pantoprazole  (PROTONIX ) 40 MG tablet Take 1 tablet (40 mg total) by mouth daily before breakfast. 90 tablet 0   prochlorperazine  (COMPAZINE ) 10 MG tablet Take 1 tablet (10 mg total) by mouth every 6 (six) hours as needed (Nausea or vomiting). 30 tablet 1   Pseudoephedrine-APAP-DM (DAYQUIL PO) Take 1 tablet by mouth as needed.     regorafenib  (STIVARGA ) 40 MG tablet Take 4 tablets (160 mg total) by mouth daily for 21 days on, 7 days off. Repeat every 28 days. 84 tablet 3   No current facility-administered medications for this visit.    PHYSICAL EXAMINATION: ECOG PERFORMANCE STATUS: 1 - Symptomatic but completely ambulatory  Vitals:   05/05/24 0922  BP: 139/76  Pulse: 88  Resp: 17  Temp: 98 F (36.7 C)  SpO2: 97%   Wt Readings from Last 3 Encounters:  05/05/24 141 lb 6 oz (64.1 kg)  04/07/24 143 lb 8 oz (65.1 kg)  03/13/24 144 lb 14.4 oz (65.7 kg)     GENERAL:alert, no distress and comfortable SKIN: skin color, texture, turgor are normal, no rashes or significant lesions EYES: normal, Conjunctiva are pink and non-injected, sclera clear NECK: supple, thyroid normal size, non-tender, without nodularity LYMPH:  no palpable lymphadenopathy in the cervical, axillary  LUNGS: clear to auscultation and percussion with normal breathing effort HEART: regular rate & rhythm and no murmurs and no lower extremity edema ABDOMEN:abdomen soft, non-tender and normal bowel sounds Musculoskeletal:no cyanosis of digits and no clubbing  NEURO: alert & oriented x 3 with fluent speech, no focal motor/sensory deficits  Physical Exam    LABORATORY DATA:  I have reviewed the data as listed    Latest Ref Rng & Units 05/05/2024    9:02 AM 04/07/2024   10:30 AM 03/13/2024   12:26 PM  CBC  WBC 4.0 - 10.5 K/uL 5.1  2.7  2.4   Hemoglobin 13.0  - 17.0 g/dL 84.8  86.0  86.6   Hematocrit 39.0 - 52.0 % 46.6  42.8  39.5   Platelets 150 - 400 K/uL 176  124  151         Latest Ref Rng & Units 05/05/2024    9:02 AM 04/07/2024   10:30 AM 03/13/2024   12:26 PM  CMP  Glucose 70 - 99 mg/dL 838  891  88   BUN 8 - 23 mg/dL 9  12  11    Creatinine 0.61 - 1.24 mg/dL 9.04  9.18  9.21   Sodium 135 - 145 mmol/L 138  136  136   Potassium 3.5 - 5.1 mmol/L 4.0  3.9  4.1   Chloride 98 - 111 mmol/L 103  102  102   CO2 22 - 32 mmol/L 31  31  33   Calcium  8.9 - 10.3 mg/dL 8.6  8.3  8.8   Total Protein 6.5 - 8.1 g/dL 6.8  6.8  6.8   Total Bilirubin 0.0 - 1.2 mg/dL 0.7  0.4  0.6   Alkaline Phos 38 - 126 U/L 109  129  85   AST 15 - 41 U/L 27  26  19    ALT 0 - 44 U/L 18  20  12        RADIOGRAPHIC STUDIES: I have personally reviewed the radiological images as listed and agreed with the findings in the report. No results found.    No orders of the defined types were placed in this encounter.  All questions were answered. The patient knows to call the clinic with any problems, questions or concerns. No barriers to learning was detected. The total time spent in the appointment was 30 minutes, including review of chart and various tests results, discussions about plan of care and coordination of care plan     Onita Mattock, MD 05/05/2024

## 2024-05-05 NOTE — Progress Notes (Signed)
 Palliative Medicine [facility]  Telephone:(336) [phone] Fax:(336) [phone]   Name: Jorge [name] Date: 05/05/2024 MRN: [id number]  DOB: 04/11/1951  Patient Care Team: Petrina Pries, NP as PCP - General (Family Medicine) Croitoru, Jerel, MD as PCP - Cardiology (Cardiology) Pickenpack-Cousar, Fannie SAILOR, NP as Nurse Practitioner (Nurse Practitioner) Lanny Callander, MD as Consulting Physician (Hematology)   INTERVAL HISTORY: Jorge [name] is a 73 y.o. 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  Palliative ask to see for symptom management and goals of care.   SOCIAL HISTORY:     reports that he quit smoking about 6 months ago. His smoking use included cigarettes. He has a 5 pack-year smoking history. He has never used smokeless tobacco. He reports that he does not currently use alcohol. He reports that he does not use drugs.  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  MEDICATIONS:  Current Outpatient Medications  Medication Sig Dispense Refill   amLODipine  (NORVASC ) 10 MG tablet Take 1 tablet (10 mg total) by mouth daily. 90 tablet 1   diphenoxylate -atropine  (LOMOTIL ) 2.5-0.025 MG tablet Take 1-2 tablets by mouth 4 (four) times daily as needed for diarrhea or loose stools. 90 tablet 1   gabapentin  (NEURONTIN ) 100 MG capsule TAKE 2 CAPSULES BY MOUTH 3 TIMES DAILY. 180 capsule 2   metoprolol  tartrate (LOPRESSOR ) 25 MG tablet Take 1 tablet (25 mg total) by mouth 2 (two) times daily. 60 tablet 0   Multiple Vitamin (MULTIVITAMIN WITH MINERALS) TABS tablet Take 1 tablet by mouth daily.     ondansetron  (ZOFRAN ) 8 MG tablet Take 1 tablet (8 mg total) by mouth every 8 (eight) hours as needed for nausea or  vomiting. 30 tablet 3   [START ON 05/11/2024] oxyCODONE  (OXY IR/ROXICODONE ) 5 MG immediate release tablet Take 1 tablet (5 mg total) by mouth every 6 (six) hours as needed for severe pain (pain score 7-10) or breakthrough pain. 60 tablet 0   pantoprazole  (PROTONIX ) 40 MG tablet Take 1 tablet (40 mg total) by mouth daily before breakfast. 90 tablet 0   prochlorperazine  (COMPAZINE ) 10 MG tablet Take 1 tablet (10 mg total) by mouth every 6 (six) hours as needed (Nausea or vomiting). 30 tablet 1   Pseudoephedrine-APAP-DM (DAYQUIL PO) Take 1 tablet by mouth as needed.     regorafenib  (STIVARGA ) 40 MG tablet Take 4 tablets (160 mg total) by mouth daily for 21 days on, 7 days off. Repeat every 28 days. 84 tablet 3   No current facility-administered medications for this visit.    VITAL SIGNS: There were no vitals taken for this visit. There were no vitals filed for this visit.   Estimated body mass index is 19.17 kg/m as calculated from the following:   Height as of an earlier encounter on 05/05/24: 6' (1.829 m).   Weight as of an earlier encounter on 05/05/24: 141 lb 6 oz (64.1 kg).  Assessment NAD RRR Normal breathing pattern AAO x3  PERFORMANCE STATUS (ECOG) : 1 - Symptomatic but completely ambulatory  IMPRESSION:  Jorge [name] presents to clinic today for symptom management follow-up.  No acute distress noted.  Continues to remain as active as possible. Occasional fatigue.  Reports his appetite fluctuates.  Some days are better than others but feels that it has much improved.  Current weight is stable at 141lbs. Denies concerns with nausea, vomiting, or constipation.    Shares that his grandchildren are preparing to go off to college in [place]. He speaks to how proud he is of them.   We discussed his pain at length.  Jorge [name] reports pain is well-controlled on current regimen however does have some worsening neuropathic discomfort. His current regimen consist of gabapentin  200 mg 3 times daily  and oxycodone  5 mg every 6 hours as needed. Advised to increase gabapentin  to 300 mg 3 times a day.  He verbalized understanding.  Will continue to closely monitor.  All questions answered and support provided.   PLAN:  Continue gabapentin  to 300mg  three times daily.   Oxy IR 5mg  every 4-6 hours as needed for severe pain over the next 7 days. Will send in refill as appropriate. I will plan to see patient back in 4-6 weeks in collaboration with his oncology appointments.   Patient expressed understanding and was in agreement with this plan. He also understands that He can call the clinic at any time with any questions, concerns, or complaints.     Any controlled substances utilized were prescribed in the context of palliative care. PDMP has been reviewed.    Visit consisted of counseling and education dealing with the complex and emotionally intense issues of symptom management and palliative care in the setting of serious and potentially life-threatening illness.  Levon Borer, AGPCNP-BC  Palliative Medicine Team/[facility]

## 2024-05-07 ENCOUNTER — Encounter: Payer: Self-pay | Admitting: Hematology

## 2024-05-12 ENCOUNTER — Other Ambulatory Visit: Payer: Self-pay | Admitting: Nurse Practitioner

## 2024-05-12 DIAGNOSIS — G893 Neoplasm related pain (acute) (chronic): Secondary | ICD-10-CM

## 2024-05-12 DIAGNOSIS — C20 Malignant neoplasm of rectum: Secondary | ICD-10-CM

## 2024-05-12 DIAGNOSIS — Z515 Encounter for palliative care: Secondary | ICD-10-CM

## 2024-05-12 MED ORDER — OXYCODONE HCL 5 MG PO TABS: 5.0000 mg | ORAL_TABLET | Freq: Four times a day (QID) | ORAL | 0 refills | Status: DC | PRN

## 2024-05-15 ENCOUNTER — Telehealth: Payer: Self-pay

## 2024-05-15 ENCOUNTER — Other Ambulatory Visit: Payer: Self-pay

## 2024-05-15 ENCOUNTER — Other Ambulatory Visit: Payer: Self-pay | Admitting: Hematology

## 2024-05-15 DIAGNOSIS — C20 Malignant neoplasm of rectum: Secondary | ICD-10-CM

## 2024-05-15 DIAGNOSIS — Z515 Encounter for palliative care: Secondary | ICD-10-CM

## 2024-05-15 DIAGNOSIS — R197 Diarrhea, unspecified: Secondary | ICD-10-CM

## 2024-05-15 MED ORDER — REGORAFENIB 40 MG PO TABS: ORAL_TABLET | ORAL | 3 refills | Status: DC

## 2024-05-16 ENCOUNTER — Encounter: Payer: Self-pay | Admitting: Hematology

## 2024-05-27 ENCOUNTER — Encounter: Payer: Self-pay | Admitting: Hematology

## 2024-06-03 ENCOUNTER — Encounter: Payer: Self-pay | Admitting: Nurse Practitioner

## 2024-06-03 ENCOUNTER — Other Ambulatory Visit: Payer: Self-pay

## 2024-06-03 ENCOUNTER — Inpatient Hospital Stay (HOSPITAL_BASED_OUTPATIENT_CLINIC_OR_DEPARTMENT_OTHER): Admitting: Hematology

## 2024-06-03 ENCOUNTER — Inpatient Hospital Stay (HOSPITAL_BASED_OUTPATIENT_CLINIC_OR_DEPARTMENT_OTHER): Admitting: Nurse Practitioner

## 2024-06-03 ENCOUNTER — Inpatient Hospital Stay: Attending: Physician Assistant

## 2024-06-03 VITALS — BP 151/94 | HR 84 | Temp 97.7°F | Resp 16 | Wt 140.9 lb

## 2024-06-03 DIAGNOSIS — C7801 Secondary malignant neoplasm of right lung: Secondary | ICD-10-CM | POA: Diagnosis not present

## 2024-06-03 DIAGNOSIS — C20 Malignant neoplasm of rectum: Secondary | ICD-10-CM | POA: Diagnosis not present

## 2024-06-03 DIAGNOSIS — C7800 Secondary malignant neoplasm of unspecified lung: Secondary | ICD-10-CM

## 2024-06-03 DIAGNOSIS — Z79899 Other long term (current) drug therapy: Secondary | ICD-10-CM | POA: Insufficient documentation

## 2024-06-03 DIAGNOSIS — R530 Neoplastic (malignant) related fatigue: Secondary | ICD-10-CM

## 2024-06-03 DIAGNOSIS — M792 Neuralgia and neuritis, unspecified: Secondary | ICD-10-CM | POA: Diagnosis not present

## 2024-06-03 DIAGNOSIS — G893 Neoplasm related pain (acute) (chronic): Secondary | ICD-10-CM | POA: Diagnosis not present

## 2024-06-03 DIAGNOSIS — Z515 Encounter for palliative care: Secondary | ICD-10-CM | POA: Diagnosis not present

## 2024-06-03 LAB — CBC WITH DIFFERENTIAL (CANCER CENTER ONLY)
Abs Immature Granulocytes: 0.01 K/uL (ref 0.00–0.07)
Basophils Absolute: 0 K/uL (ref 0.0–0.1)
Basophils Relative: 0 %
Eosinophils Absolute: 0.1 K/uL (ref 0.0–0.5)
Eosinophils Relative: 2 %
HCT: 48 % (ref 39.0–52.0)
Hemoglobin: 15.7 g/dL (ref 13.0–17.0)
Immature Granulocytes: 0 %
Lymphocytes Relative: 21 %
Lymphs Abs: 1 K/uL (ref 0.7–4.0)
MCH: 29.9 pg (ref 26.0–34.0)
MCHC: 32.7 g/dL (ref 30.0–36.0)
MCV: 91.4 fL (ref 80.0–100.0)
Monocytes Absolute: 0.4 K/uL (ref 0.1–1.0)
Monocytes Relative: 9 %
Neutro Abs: 3.1 K/uL (ref 1.7–7.7)
Neutrophils Relative %: 68 %
Platelet Count: 143 K/uL — ABNORMAL LOW (ref 150–400)
RBC: 5.25 MIL/uL (ref 4.22–5.81)
RDW: 13.4 % (ref 11.5–15.5)
WBC Count: 4.5 K/uL (ref 4.0–10.5)
nRBC: 0 % (ref 0.0–0.2)

## 2024-06-03 LAB — CMP (CANCER CENTER ONLY)
ALT: 18 U/L (ref 0–44)
AST: 34 U/L (ref 15–41)
Albumin: 2.8 g/dL — ABNORMAL LOW (ref 3.5–5.0)
Alkaline Phosphatase: 115 U/L (ref 38–126)
Anion gap: 3 — ABNORMAL LOW (ref 5–15)
BUN: 7 mg/dL — ABNORMAL LOW (ref 8–23)
CO2: 32 mmol/L (ref 22–32)
Calcium: 8.3 mg/dL — ABNORMAL LOW (ref 8.9–10.3)
Chloride: 103 mmol/L (ref 98–111)
Creatinine: 0.72 mg/dL (ref 0.61–1.24)
GFR, Estimated: >60 mL/min (ref >60)
Glucose, Bld: 108 mg/dL — ABNORMAL HIGH (ref 70–99)
Potassium: 3.3 mmol/L — ABNORMAL LOW (ref 3.5–5.1)
Sodium: 138 mmol/L (ref 135–145)
Total Bilirubin: 0.5 mg/dL (ref 0.0–1.2)
Total Protein: 6.7 g/dL (ref 6.5–8.1)

## 2024-06-03 LAB — CEA (ACCESS): CEA (CHCC): 339.81 ng/mL — ABNORMAL HIGH (ref 0.00–5.00)

## 2024-06-03 MED ORDER — METOPROLOL TARTRATE 25 MG PO TABS: 25.0000 mg | ORAL_TABLET | Freq: Two times a day (BID) | ORAL | 1 refills | Status: DC

## 2024-06-03 MED ORDER — OXYCODONE HCL 5 MG PO TABS
5.0000 mg | ORAL_TABLET | Freq: Four times a day (QID) | ORAL | 0 refills | Status: DC | PRN
Start: 2024-06-06 — End: 2024-06-16

## 2024-06-03 NOTE — Progress Notes (Signed)
 [facility]   Telephone:(336) [phone] Fax:(336) [phone]   Clinic Follow up Note   Patient Care Team: Petrina Pries, NP as PCP - General (Family Medicine) Croitoru, Jerel, MD as PCP - Cardiology (Cardiology) Pickenpack-Cousar, Fannie SAILOR, NP as Nurse Practitioner (Nurse Practitioner) Lanny Callander, MD as Consulting Physician (Hematology)  Date of Service:  06/03/2024  CHIEF COMPLAINT: f/u of rectal cancer  CURRENT THERAPY:  Stivarga   Oncology History   Rectal cancer (HCC) [phone] with lung metastasis, MMR normal, KRAS(+) [id number]  -diagnosed 08/2021 by colonoscopy for rectal bleeding and frequent BM. Staging MRI showed early extra mesorectal lymph node involvement.  -baseline CEA 571.84 on 09/06/21.  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 Xeloda  dose reduced due to elevated liver enzymes.  -due to new liver lesions, we switched to second-line FOLFIRI, with continued beva, on 02/21/22. He tolerates well overall.  - restaging CT scan on April 22 and June 26 showed stable disease overall. -CT 10/09/2023 showed mild disease progression in lungs, his tumor marker CEA has been going up also.  -I changed his chemo to lonsurf  and beva, he started on 12/13/2023, unfortunately progressed on CT 02/25/2024 -treatment changed to Stivarga . He required drug assistance    Assessment & Plan Metastatic colon cancer Metastatic colon cancer managed with Stivarga . No significant side effects except manageable diarrhea. Energy levels are good, and he is able to perform daily activities. - Continue Stivarga  as prescribed - Schedule CT scan in three weeks - Plan follow-up visit in five weeks  Chemotherapy-induced diarrhea Diarrhea associated with chemotherapy is manageable with Imodium. Lomotil  was ineffective. - Continue Imodium for diarrhea  management  Chemotherapy-induced peripheral neuropathy Current regimen of alternating between two and three pills is effective. - Continue current gabapentin  regimen  Hypertension Hypertension with recent readings of 164/87 and 151/94. Currently on amlodipine  10 mg daily. Decision made to restart metoprolol  due to elevated blood pressure. - Restart metoprolol  as prescribed - Call in metoprolol  prescription to [facility] in [facility] - He is tolerating full dose Stivarga  well, will continue.  Will start next cycle on August 9 - Follow-up in 5 weeks, with restaging CT scan 1 week before  SUMMARY OF ONCOLOGIC HISTORY: Oncology History Overview Note  Cancer Staging Rectal cancer [facility]) Staging form: Colon and Rectum, AJCC 8th Edition - Clinical stage from 08/29/2021: [name], cM1 - Signed by Lanny Callander, MD on 08/29/2021    Rectal cancer (HCC)  07/23/2021 Imaging   CT AP  IMPRESSION: 1. Appearance of the rectum likely indicates a rectal mass suspicious for rectal carcinoma. Inflammatory process would be less likely. Direct visualization is suggested. 2. Cholelithiasis without evidence of acute cholecystitis. 3. Aortic atherosclerosis.   08/19/2021 Procedure   Colonoscopy, under Dr. Wilhelmenia  Impression: - Rectal tenderness, palpable rectal mass and hemorrhoids found on digital rectal exam. - Stool in the entire examined colon - lavaged with still inadequate clearance. - One 35 mm polyp at the hepatic flexure. Biopsied. Tattooed distal in case future endoscopic resection is considered - however, has larger issues with rectal mass currently as below. - Four, 5 to 11 mm polyps in the transverse colon and at the hepatic flexure, removed with a cold snare. Resected and retrieved. - Diverticulosis in the recto-sigmoid colon and in the sigmoid colon. - Rule out malignancy, partially obstructing tumor in the rectum and from  6 to 13 cm proximal to the anus.  Biopsied.   08/19/2021 Pathology Results   Diagnosis 1. Hepatic Flexure Biopsy - TUBULAR ADENOMA WITHOUT HIGH-GRADE DYSPLASIA OR MALIGNANCY 2. Transverse Colon Biopsy, and hepatic flexure, polyps (4) - TUBULAR ADENOMA WITHOUT HIGH-GRADE DYSPLASIA OR MALIGNANCY - OTHER FRAGMENTS OF POLYPOID COLONIC MUCOSA WITH NO SPECIFIC HISTOPATHOLOGIC CHANGES - FOOD MATERIAL 3. Rectum, biopsy - ADENOCARCINOMA. SEE NOTE   08/22/2021 Initial Diagnosis   Rectal cancer (HCC)   08/26/2021 Imaging   IMPRESSION: 1. A 2.2 x 1.9 cm right middle lobe pulmonary nodule as well as a total of four right upper lobe pulmonary nodule and masses measuring up to 3.6 cm. Findings concerning for metastatic primary lung cancer versus less likely metastases in a patient with rectal cancer. Additional imaging evaluation or consultation with Pulmonology or Thoracic Surgery recommended. 2. No gross hilar adenopathy, noting limited sensitivity for the detection of hilar adenopathy on this noncontrast study. 3. Cholelithiasis. 4.  Emphysema (ICD10-J43.9). 5. At least left anterior descending coronary artery calcifications.   08/27/2021 Imaging   IMPRESSION: Rectal adenocarcinoma T stage: T4 B   Rectal adenocarcinoma N stage: N2 disease likely associated with early extra mesorectal lymph node involvement.   Distance from tumor to the internal anal sphincter is 1.2 cm.   Also with extramural venous involvement as described.   08/29/2021 Cancer Staging   Staging form: Colon and Rectum, AJCC 8th Edition - Clinical stage from 08/29/2021: [name], cM1 - Signed by Lanny Callander, MD on 08/29/2021 Stage prefix: Initial diagnosis   01/23/2022 Imaging   CT CAP w contrast IMPRESSION: 1. Mild interval decrease in size of multiple right-sided pulmonary nodules. No new suspicious pulmonary nodule or mass. 2. Interval development of approximately 10 new ill-defined hypoattenuating liver lesions ranging in size from approximately  5-10 mm. Imaging features suspicious for metastatic disease. MRI abdomen with and without contrast may prove helpful to further evaluate. 3. Similar appearance of wall thickening in the rectum with perirectal edema. 4. Cholelithiasis. 5. Prostatomegaly. 6. Aortic Atherosclerosis (ICD10-I70.0).   11/21/2022 Imaging    IMPRESSION: 1. Stable exam. No new or progressive interval findings. 2. The primary rectosigmoid lesion described previously is not well seen on the current study. There is presacral and perirectal edema, likely treatment related. 3. No substantial change in appearance of bilateral pulmonary metastases. 4. Stable tiny hypodensities in both hepatic lobes. These were previously characterized as metastatic disease. No new liver lesions on today's exam. 5. Cholelithiasis. 6. Emphysema (ICD10-J43.9) and Aortic Atherosclerosis (ICD10-170.0)     02/25/2024 Imaging   CT Chest, Abdomen, and Pelvis with contrast  IMPRESSION: Multiple lung nodules are again identified. These are increased in size from previous.   Previous nodes however in the mediastinum and right lung hilum are improving.   Fatty infiltration with multiple low-attenuation liver lesions. Many these are slightly larger but more low in density today. Dilated gallbladder with stones.   Persistent wall thickening and nodularity along the rectum with adjacent stranding and fluid. Please correlate with exact location of neoplasm.   Overall mixed response.       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)   10/14/2021 - 01/25/2022 Chemotherapy   Patient is on Treatment Plan : COLORECTAL CapeOx + Bevacizumab  q21d     02/21/2022 - 06/29/2022 Chemotherapy   Patient is on Treatment Plan : COLORECTAL FOLFIRI / BEVACIZUMAB  Q14D     02/21/2022 - 11/16/2023 Chemotherapy  Patient is on Treatment Plan : COLORECTAL FOLFIRI + Bevacizumab  q14d      12/13/2023 - 01/31/2024 Chemotherapy   Patient is on Treatment Plan : COLORECTAL Bevacizumab  + Trifluridine /Tipiracil  q28d     02/25/2024 Imaging   CT CAP with contrast  IMPRESSION: Multiple lung nodules are again identified. These are increased in size from previous.  Previous nodes however in the mediastinum and right lung hilum are improving.   Fatty infiltration with multiple low-attenuation liver lesions. Many these are slightly larger but more low in density today.   Dilated gallbladder with stones. Persistent wall thickening and nodularity along the rectum with adjacent stranding and fluid. Please correlate with exact location of neoplasm.   Overall mixed response.          Discussed the use of AI scribe software for clinical note transcription with the patient, who gave verbal consent to proceed.  History of Present Illness Jorge [name] is a 73 year old male with metastatic colon cancer who presents for follow-up.  He is on Stivarga  (regorafenib ) for metastatic colon cancer and experiences diarrhea, which he manages with Imodium (loperamide). He is also on a chemotherapy regimen with Signatera, taking four pills daily for three weeks followed by a one-week break, currently in the third week of the cycle. He denies skin problems and nausea.  He maintains good energy levels, allowing him to work on his car and take care of his grandchildren. His blood pressure is elevated, with readings of 164/87 and 151/94, and he takes amlodipine  10 mg daily. He was previously prescribed metoprolol  but has not been taking it recently.  He experiences foot pain, managed with gabapentin , and is satisfied with the current regimen of two pills.     All other systems were reviewed with the patient and are negative.  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    SURGICAL HISTORY: Past  Surgical History:  Procedure Laterality Date   BRONCHIAL BIOPSY  09/27/2021   Procedure: BRONCHIAL BIOPSIES;  Surgeon: Brenna Adine CROME, DO;  Location: [facility];  Service: Pulmonary;;   BRONCHIAL BRUSHINGS  09/27/2021   Procedure: BRONCHIAL BRUSHINGS;  Surgeon: Brenna Adine CROME, DO;  Location: [facility];  Service: Pulmonary;;   BRONCHIAL NEEDLE ASPIRATION BIOPSY  09/27/2021   Procedure: BRONCHIAL NEEDLE ASPIRATION BIOPSIES;  Surgeon: Brenna Adine CROME, DO;  Location: [facility];  Service: Pulmonary;;   COLONOSCOPY     IR IMAGING GUIDED PORT INSERTION  10/24/2021   left breast surgery Left 1968   VIDEO BRONCHOSCOPY WITH RADIAL ENDOBRONCHIAL ULTRASOUND  09/27/2021   Procedure: RADIAL ENDOBRONCHIAL ULTRASOUND;  Surgeon: Brenna Adine CROME, DO;  Location: [facility];  Service: Pulmonary;;    I have reviewed the social history and family history with the patient and they are unchanged from previous note.  ALLERGIES:  has no known allergies.  MEDICATIONS:  Current Outpatient Medications  Medication Sig Dispense Refill   amLODipine  (NORVASC ) 10 MG tablet Take 1 tablet (10 mg total) by mouth daily. 90 tablet 1   diphenoxylate -atropine  (LOMOTIL ) 2.5-0.025 MG tablet TAKE 1-2 TABLETS BY MOUTH 4 (FOUR) TIMES DAILY AS NEEDED FOR DIARRHEA OR LOOSE STOOLS. 120 tablet 0   gabapentin  (NEURONTIN ) 100 MG capsule TAKE 2 CAPSULES BY MOUTH 3 TIMES DAILY. 180 capsule 2   metoprolol  tartrate (LOPRESSOR ) 25 MG tablet Take 1 tablet (25 mg total) by mouth 2 (two) times daily. 60 tablet  1   Multiple Vitamin (MULTIVITAMIN WITH MINERALS) TABS tablet Take 1 tablet by mouth daily.     ondansetron  (ZOFRAN ) 8 MG tablet Take 1 tablet (8 mg total) by mouth every 8 (eight) hours as needed for nausea or vomiting. 30 tablet 3   oxyCODONE  (OXY IR/ROXICODONE ) 5 MG immediate release tablet Take 1 tablet (5 mg total) by mouth every 6 (six) hours as needed for severe pain (pain score 7-10) or breakthrough pain. 60 tablet 0    pantoprazole  (PROTONIX ) 40 MG tablet Take 1 tablet (40 mg total) by mouth daily before breakfast. 90 tablet 0   prochlorperazine  (COMPAZINE ) 10 MG tablet Take 1 tablet (10 mg total) by mouth every 6 (six) hours as needed (Nausea or vomiting). 30 tablet 1   Pseudoephedrine-APAP-DM (DAYQUIL PO) Take 1 tablet by mouth as needed.     regorafenib  (STIVARGA ) 40 MG tablet Take 4 tablets (160 mg total) by mouth daily for 21 days on, 7 days off. Repeat every 28 days. 84 tablet 3   No current facility-administered medications for this visit.    PHYSICAL EXAMINATION: ECOG PERFORMANCE STATUS: 1 - Symptomatic but completely ambulatory  Vitals:   06/03/24 0959 06/03/24 1000  BP: (!) 164/87 (!) 151/94  Pulse: 84   Resp: 16   Temp: 97.7 F (36.5 C)   SpO2: 98%    Wt Readings from Last 3 Encounters:  06/03/24 140 lb 14.4 oz (63.9 kg)  05/05/24 141 lb 6 oz (64.1 kg)  04/07/24 143 lb 8 oz (65.1 kg)     GENERAL:alert, no distress and comfortable SKIN: skin color, texture, turgor are normal, no rashes or significant lesions EYES: normal, Conjunctiva are pink and non-injected, sclera clear NECK: supple, thyroid normal size, non-tender, without nodularity LYMPH:  no palpable lymphadenopathy in the cervical, axillary  LUNGS: clear to auscultation and percussion with normal breathing effort HEART: regular rate & rhythm and no murmurs and no lower extremity edema ABDOMEN:abdomen soft, non-tender and normal bowel sounds Musculoskeletal:no cyanosis of digits and no clubbing  NEURO: alert & oriented x 3 with fluent speech, no focal motor/sensory deficits  Physical Exam VITALS: BP- 164/87 SKIN: Skin normal  LABORATORY DATA:  I have reviewed the data as listed    Latest Ref Rng & Units 06/03/2024    9:32 AM 05/05/2024    9:02 AM 04/07/2024   10:30 AM  CBC  WBC 4.0 - 10.5 K/uL 4.5  5.1  2.7   Hemoglobin 13.0 - 17.0 g/dL 84.2  84.8  86.0   Hematocrit 39.0 - 52.0 % 48.0  46.6  42.8   Platelets 150 -  400 K/uL 143  176  124         Latest Ref Rng & Units 06/03/2024    9:32 AM 05/05/2024    9:02 AM 04/07/2024   10:30 AM  CMP  Glucose 70 - 99 mg/dL 891  838  891   BUN 8 - 23 mg/dL 7  9  12    Creatinine 0.61 - 1.24 mg/dL 9.27  9.04  9.18   Sodium 135 - 145 mmol/L 138  138  136   Potassium 3.5 - 5.1 mmol/L 3.3  4.0  3.9   Chloride 98 - 111 mmol/L 103  103  102   CO2 22 - 32 mmol/L 32  31  31   Calcium  8.9 - 10.3 mg/dL 8.3  8.6  8.3   Total Protein 6.5 - 8.1 g/dL 6.7  6.8  6.8   Total  Bilirubin 0.0 - 1.2 mg/dL 0.5  0.7  0.4   Alkaline Phos 38 - 126 U/L 115  109  129   AST 15 - 41 U/L 34  27  26   ALT 0 - 44 U/L 18  18  20        RADIOGRAPHIC STUDIES: I have personally reviewed the radiological images as listed and agreed with the findings in the report. No results found.    Orders Placed This Encounter  Procedures   CT CHEST ABDOMEN PELVIS W CONTRAST    Standing Status:   Future    Expected Date:   07/01/2024    Expiration Date:   06/03/2025    If indicated for the ordered procedure, I authorize the administration of contrast media per Radiology protocol:   Yes    Does the patient have a contrast media/X-ray dye allergy?:   No    Preferred imaging location?:   [facility]    Release to patient:   Immediate    If indicated for the ordered procedure, I authorize the administration of oral contrast media per Radiology protocol:   Yes   All questions were answered. The patient knows to call the clinic with any problems, questions or concerns. No barriers to learning was detected. The total time spent in the appointment was 25 minutes, including review of chart and various tests results, discussions about plan of care and coordination of care plan     Onita Mattock, MD 06/03/2024

## 2024-06-03 NOTE — Assessment & Plan Note (Signed)
 [id number] with lung metastasis, MMR normal, KRAS(+) [id number]  -diagnosed 08/2021 by colonoscopy for rectal bleeding and frequent BM. Staging MRI showed early extra mesorectal lymph node involvement.  -baseline CEA 571.84 on 09/06/21.  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 Xeloda  dose reduced due to elevated liver enzymes.  -due to new liver lesions, we switched to second-line FOLFIRI, with continued beva, on 02/21/22. He tolerates well overall.  - restaging CT scan on April 22 and June 26 showed stable disease overall. -CT 10/09/2023 showed mild disease progression in lungs, his tumor marker CEA has been going up also.  -I changed his chemo to lonsurf  and beva, he started on 12/13/2023, unfortunately progressed on CT 02/25/2024 -treatment changed to Stivarga . He required drug assistance

## 2024-06-03 NOTE — Progress Notes (Signed)
 Palliative Medicine [facility]  Telephone:(336) [phone] Fax:(336) [phone]   Name: Jorge [name] Date: 06/03/2024 MRN: [id number]  DOB: 1951/05/08  Patient Care Team: Petrina Pries, NP as PCP - General (Family Medicine) Croitoru, Jerel, MD as PCP - Cardiology (Cardiology) Pickenpack-Cousar, Fannie SAILOR, NP as Nurse Practitioner (Nurse Practitioner) Lanny Callander, MD as Consulting Physician (Hematology)   INTERVAL HISTORY: Jorge [name] is a 73 y.o. 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  Palliative ask to see for symptom management and goals of care.   SOCIAL HISTORY:     reports that he quit smoking about 7 months ago. His smoking use included cigarettes. He has a 5 pack-year smoking history. He has never used smokeless tobacco. He reports that he does not currently use alcohol. He reports that he does not use drugs.  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  MEDICATIONS:  Current Outpatient Medications  Medication Sig Dispense Refill   amLODipine  (NORVASC ) 10 MG tablet Take 1 tablet (10 mg total) by mouth daily. 90 tablet 1   diphenoxylate -atropine  (LOMOTIL ) 2.5-0.025 MG tablet TAKE 1-2 TABLETS BY MOUTH 4 (FOUR) TIMES DAILY AS NEEDED FOR DIARRHEA OR LOOSE STOOLS. 120 tablet 0   gabapentin  (NEURONTIN ) 100 MG capsule TAKE 2 CAPSULES BY MOUTH 3 TIMES DAILY. 180 capsule 2   metoprolol  tartrate (LOPRESSOR ) 25 MG tablet Take 1 tablet (25 mg total) by mouth 2 (two) times daily. 60 tablet 1   Multiple Vitamin (MULTIVITAMIN WITH MINERALS) TABS tablet Take 1 tablet by mouth daily.     ondansetron  (ZOFRAN ) 8 MG tablet Take 1 tablet (8 mg total) by mouth every 8 (eight) hours as needed for nausea or  vomiting. 30 tablet 3   oxyCODONE  (OXY IR/ROXICODONE ) 5 MG immediate release tablet Take 1 tablet (5 mg total) by mouth every 6 (six) hours as needed for severe pain (pain score 7-10) or breakthrough pain. 60 tablet 0   pantoprazole  (PROTONIX ) 40 MG tablet Take 1 tablet (40 mg total) by mouth daily before breakfast. 90 tablet 0   prochlorperazine  (COMPAZINE ) 10 MG tablet Take 1 tablet (10 mg total) by mouth every 6 (six) hours as needed (Nausea or vomiting). 30 tablet 1   Pseudoephedrine-APAP-DM (DAYQUIL PO) Take 1 tablet by mouth as needed.     regorafenib  (STIVARGA ) 40 MG tablet Take 4 tablets (160 mg total) by mouth daily for 21 days on, 7 days off. Repeat every 28 days. 84 tablet 3   No current facility-administered medications for this visit.    VITAL SIGNS: There were no vitals taken for this visit. There were no vitals filed for this visit.   Estimated body mass index is 19.11 kg/m as calculated from the following:   Height as of 05/05/24: 6' (1.829 m).   Weight as of an earlier encounter on 06/03/24: 140 lb 14.4 oz (63.9 kg).  Assessment NAD RRR Normal breathing pattern AAO x3  PERFORMANCE STATUS (ECOG) : 1 - Symptomatic but completely ambulatory  IMPRESSION:  [name] presents to clinic today for symptom management follow-up.  No acute distress noted.  Continues to remain as active as possible. Occasional fatigue.  Reports his appetite fluctuates.  Some days are better than others but  feels that it has much improved. Current weight is stable at 140lbs. Denies concerns with nausea, vomiting, or constipation.   We discussed his pain at length.  Mr. Celena reports pain is well-controlled on current regimen however does have some worsening neuropathic discomfort. His current regimen consist of gabapentin  200 mg 3 times daily and oxycodone  5 mg every 6 hours as needed. Advised to increase gabapentin  to 300 mg 3 times a day.  He verbalized understanding.  Will continue to closely  monitor.  All questions answered and support provided.   PLAN:  Continue gabapentin  to 300mg  three times daily.   Oxy IR 5mg  every 4-6 hours as needed for severe pain over the next 7 days. Will send in refill as appropriate. I will plan to see patient back in 4-6 weeks in collaboration with his oncology appointments.   Patient expressed understanding and was in agreement with this plan. He also understands that He can call the clinic at any time with any questions, concerns, or complaints.     Any controlled substances utilized were prescribed in the context of palliative care. PDMP has been reviewed.    Visit consisted of counseling and education dealing with the complex and emotionally intense issues of symptom management and palliative care in the setting of serious and potentially life-threatening illness.  Levon Borer, AGPCNP-BC  Palliative Medicine Team/[facility]

## 2024-06-05 ENCOUNTER — Telehealth: Payer: Self-pay | Admitting: Hematology

## 2024-06-05 NOTE — Telephone Encounter (Signed)
 Scheduled appointments per 7/29 los. Talked with the patients daughter and she is aware of the made appointments for the patient.

## 2024-06-16 ENCOUNTER — Other Ambulatory Visit: Payer: Self-pay | Admitting: Nurse Practitioner

## 2024-06-16 DIAGNOSIS — Z515 Encounter for palliative care: Secondary | ICD-10-CM

## 2024-06-16 DIAGNOSIS — G893 Neoplasm related pain (acute) (chronic): Secondary | ICD-10-CM

## 2024-06-16 DIAGNOSIS — C7800 Secondary malignant neoplasm of unspecified lung: Secondary | ICD-10-CM

## 2024-06-16 MED ORDER — OXYCODONE HCL 5 MG PO TABS
5.0000 mg | ORAL_TABLET | Freq: Four times a day (QID) | ORAL | 0 refills | Status: DC | PRN
Start: 2024-06-16 — End: 2024-07-01

## 2024-06-29 ENCOUNTER — Other Ambulatory Visit: Payer: Self-pay | Admitting: Hematology

## 2024-07-01 ENCOUNTER — Ambulatory Visit (HOSPITAL_COMMUNITY)
Admission: RE | Admit: 2024-07-01 | Discharge: 2024-07-01 | Disposition: A | Discharge status: Home / Self Care | Source: Ambulatory Visit | Attending: Hematology | Admitting: Hematology

## 2024-07-01 ENCOUNTER — Other Ambulatory Visit: Payer: Self-pay

## 2024-07-01 ENCOUNTER — Inpatient Hospital Stay: Attending: Physician Assistant

## 2024-07-01 DIAGNOSIS — C20 Malignant neoplasm of rectum: Secondary | ICD-10-CM | POA: Diagnosis not present

## 2024-07-01 DIAGNOSIS — G893 Neoplasm related pain (acute) (chronic): Secondary | ICD-10-CM

## 2024-07-01 DIAGNOSIS — Z95828 Presence of other vascular implants and grafts: Secondary | ICD-10-CM

## 2024-07-01 DIAGNOSIS — K7689 Other specified diseases of liver: Secondary | ICD-10-CM | POA: Diagnosis not present

## 2024-07-01 DIAGNOSIS — K8020 Calculus of gallbladder without cholecystitis without obstruction: Secondary | ICD-10-CM | POA: Diagnosis not present

## 2024-07-01 DIAGNOSIS — C19 Malignant neoplasm of rectosigmoid junction: Secondary | ICD-10-CM | POA: Diagnosis not present

## 2024-07-01 DIAGNOSIS — C787 Secondary malignant neoplasm of liver and intrahepatic bile duct: Secondary | ICD-10-CM | POA: Diagnosis not present

## 2024-07-01 DIAGNOSIS — Z515 Encounter for palliative care: Secondary | ICD-10-CM

## 2024-07-01 LAB — CMP (CANCER CENTER ONLY)
ALT: 25 U/L (ref 0–44)
AST: 40 U/L (ref 15–41)
Albumin: 3 g/dL — ABNORMAL LOW (ref 3.5–5.0)
Alkaline Phosphatase: 121 U/L (ref 38–126)
Anion gap: 4 — ABNORMAL LOW (ref 5–15)
BUN: 7 mg/dL — ABNORMAL LOW (ref 8–23)
CO2: 30 mmol/L (ref 22–32)
Calcium: 8.4 mg/dL — ABNORMAL LOW (ref 8.9–10.3)
Chloride: 100 mmol/L (ref 98–111)
Creatinine: 0.76 mg/dL (ref 0.61–1.24)
GFR, Estimated: >60 mL/min (ref >60)
Glucose, Bld: 105 mg/dL — ABNORMAL HIGH (ref 70–99)
Potassium: 3.8 mmol/L (ref 3.5–5.1)
Sodium: 134 mmol/L — ABNORMAL LOW (ref 135–145)
Total Bilirubin: 0.5 mg/dL (ref 0.0–1.2)
Total Protein: 6.7 g/dL (ref 6.5–8.1)

## 2024-07-01 LAB — CBC WITH DIFFERENTIAL (CANCER CENTER ONLY)
Abs Immature Granulocytes: 0.01 K/uL (ref 0.00–0.07)
Basophils Absolute: 0 K/uL (ref 0.0–0.1)
Basophils Relative: 1 %
Eosinophils Absolute: 0.2 K/uL (ref 0.0–0.5)
Eosinophils Relative: 3 %
HCT: 49.8 % (ref 39.0–52.0)
Hemoglobin: 16.4 g/dL (ref 13.0–17.0)
Immature Granulocytes: 0 %
Lymphocytes Relative: 28 %
Lymphs Abs: 1.3 K/uL (ref 0.7–4.0)
MCH: 29.8 pg (ref 26.0–34.0)
MCHC: 32.9 g/dL (ref 30.0–36.0)
MCV: 90.5 fL (ref 80.0–100.0)
Monocytes Absolute: 0.5 K/uL (ref 0.1–1.0)
Monocytes Relative: 10 %
Neutro Abs: 2.6 K/uL (ref 1.7–7.7)
Neutrophils Relative %: 58 %
Platelet Count: 201 K/uL (ref 150–400)
RBC: 5.5 MIL/uL (ref 4.22–5.81)
RDW: 13.2 % (ref 11.5–15.5)
WBC Count: 4.5 K/uL (ref 4.0–10.5)
nRBC: 0 % (ref 0.0–0.2)

## 2024-07-01 LAB — CEA (ACCESS): CEA (CHCC): 434.32 ng/mL — ABNORMAL HIGH (ref 0.00–5.00)

## 2024-07-01 MED ORDER — IOHEXOL 9 MG/ML PO SOLN
ORAL | Status: AC
  Filled 2024-07-01: qty 1000

## 2024-07-01 MED ORDER — SODIUM CHLORIDE 0.9% FLUSH
10.0000 mL | Freq: Once | INTRAVENOUS | Status: AC
  Administered 2024-07-01: 10 mL

## 2024-07-01 MED ORDER — IOHEXOL 300 MG/ML  SOLN
100.0000 mL | Freq: Once | INTRAMUSCULAR | Status: AC | PRN
  Administered 2024-07-01: 100 mL via INTRAVENOUS

## 2024-07-01 MED ORDER — OXYCODONE HCL 5 MG PO TABS: 5.0000 mg | ORAL_TABLET | Freq: Four times a day (QID) | ORAL | 0 refills | Status: DC | PRN

## 2024-07-01 MED ORDER — HEPARIN SOD (PORK) LOCK FLUSH 100 UNIT/ML IV SOLN
500.0000 [IU] | Freq: Once | INTRAVENOUS | Status: AC
  Administered 2024-07-01: 500 [IU] via INTRAVENOUS

## 2024-07-01 MED ORDER — IOHEXOL 9 MG/ML PO SOLN
1000.0000 mL | ORAL | Status: AC
  Administered 2024-07-01: 1000 mL via ORAL

## 2024-07-07 NOTE — Assessment & Plan Note (Signed)
 [id number] with lung metastasis, MMR normal, KRAS(+) [id number]  -diagnosed 08/2021 by colonoscopy for rectal bleeding and frequent BM. Staging MRI showed early extra mesorectal lymph node involvement.  -baseline CEA 571.84 on 09/06/21.  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 Xeloda  dose reduced due to elevated liver enzymes.  -due to new liver lesions, we switched to second-line FOLFIRI, with continued beva, on 02/21/22. He tolerates well overall.  - restaging CT scan on April 22 and June 26 showed stable disease overall. -CT 10/09/2023 showed mild disease progression in lungs, his tumor marker CEA has been going up also.  -I changed his chemo to lonsurf  and beva, he started on 12/13/2023, unfortunately progressed on CT 02/25/2024 -treatment changed to Stivarga . He required drug assistance  -restaging CT 07/01/2024 showed disease progression  -will change tx to Nivo and cabozantinib 

## 2024-07-08 ENCOUNTER — Inpatient Hospital Stay: Attending: Physician Assistant | Admitting: Hematology

## 2024-07-08 ENCOUNTER — Other Ambulatory Visit (HOSPITAL_COMMUNITY): Payer: Self-pay

## 2024-07-08 ENCOUNTER — Encounter: Payer: Self-pay | Admitting: Hematology

## 2024-07-08 VITALS — BP 144/78 | HR 107 | Temp 97.6°F | Resp 18 | Ht 72.0 in | Wt 139.4 lb

## 2024-07-08 DIAGNOSIS — Z79620 Long term (current) use of immunosuppressive biologic: Secondary | ICD-10-CM | POA: Insufficient documentation

## 2024-07-08 DIAGNOSIS — C20 Malignant neoplasm of rectum: Secondary | ICD-10-CM | POA: Insufficient documentation

## 2024-07-08 DIAGNOSIS — C7801 Secondary malignant neoplasm of right lung: Secondary | ICD-10-CM | POA: Insufficient documentation

## 2024-07-08 DIAGNOSIS — C787 Secondary malignant neoplasm of liver and intrahepatic bile duct: Secondary | ICD-10-CM | POA: Insufficient documentation

## 2024-07-08 DIAGNOSIS — C7800 Secondary malignant neoplasm of unspecified lung: Secondary | ICD-10-CM

## 2024-07-08 DIAGNOSIS — Z5112 Encounter for antineoplastic immunotherapy: Secondary | ICD-10-CM | POA: Insufficient documentation

## 2024-07-08 MED ORDER — CABOZANTINIB S-MALATE 40 MG PO TABS
40.0000 mg | ORAL_TABLET | Freq: Every day | ORAL | 0 refills | Status: DC
  Filled 2024-07-10: qty 30, 30d supply, fill #0

## 2024-07-08 NOTE — Progress Notes (Signed)
 [facility]   Telephone:([phone] Fax:(336) [phone]   Clinic Follow up Note   Patient Care Team: Petrina Pries, NP as PCP - General (Family Medicine) Croitoru, Jerel, MD as PCP - Cardiology (Cardiology) Pickenpack-Cousar, Fannie SAILOR, NP as Nurse Practitioner (Nurse Practitioner) Lanny Callander, MD as Consulting Physician (Hematology)  Date of Service:  07/08/2024  CHIEF COMPLAINT: f/u of metastatic rectal cancer  CURRENT THERAPY:  Stivarga   Oncology History   Rectal cancer (HCC) [phone] with lung metastasis, MMR normal, KRAS(+) [id number]  -diagnosed 08/2021 by colonoscopy for rectal bleeding and frequent BM. Staging MRI showed early extra mesorectal lymph node involvement.  -baseline CEA 571.84 on 09/06/21.  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 Xeloda  dose reduced due to elevated liver enzymes.  -due to new liver lesions, we switched to second-line FOLFIRI, with continued beva, on 02/21/22. He tolerates well overall.  - restaging CT scan on April 22 and June 26 showed stable disease overall. -CT 10/09/2023 showed mild disease progression in lungs, his tumor marker CEA has been going up also.  -I changed his chemo to lonsurf  and beva, he started on 12/13/2023, unfortunately progressed on CT 02/25/2024 -treatment changed to Stivarga . He required drug assistance  -restaging CT 07/01/2024 showed disease progression  -will change tx to Nivo and cabozantinib     Assessment & Plan Metastatic rectal cancer with progression in liver and lung Metastatic rectal cancer with progression in liver and lung nodules. Liver nodules increased from 1.5 cm to 2.7 cm and 0.5 cm to 1.5 cm. Lung nodules increased from 2-3 cm to 3-4 cm. Current treatment with Stivarga  ineffective after three cycles. - Discontinue Stivarga . -Unfortunately he has run out of  standard care options. -Based on the phase 2 trial of cabozantinib  and nivolumab in [facility] stable metastatic colorectal cancer (Journal of Clinical Oncology Volume 43, Number 4_suppl February 2025), which showed 40% disease control rate at 16 weeks, with manageable side effects, I recommend this regiment.  - Initiate nivolumab infusion every two weeks, starting September 11, pending insurance approval. - Initiate cabozantinib  oral medication, starting with nivolumab infusion. - Coordinate with insurance for approval of nivolumab and cabozantinib . - Schedule follow-up infusion on October 9. - Instruct him to bring unopened Stivarga  to the next appointment for potential donation.  Diarrhea secondary to cancer therapy and dietary factors Intermittent diarrhea approximately once a week, potentially related to dietary habits, particularly eating late at night. No diarrhea when medication schedule is followed correctly. - Advise him to avoid eating late at night to minimize diarrhea.  Chronic pain managed with gabapentin  Chronic pain managed with gabapentin . Improved pain control with increased dose from two to three tablets as advised by nursing staff. - Continue gabapentin  at current dose of three tablets.  Plan - Restaging CT scan reviewed, which unfortunately showed disease progression. - I recommend stop Stivarga , and change treatment to cabozantinib  40 mg daily, nivolumab 480 mg every 4 weeks, plan to start in 1 to 2 weeks.  Will try drug replacement if insurance denies it. -f/u with first cycle treatment.    SUMMARY OF ONCOLOGIC HISTORY: Oncology History Overview Note  Cancer Staging Rectal cancer [facility]) Staging form: Colon and Rectum, AJCC 8th Edition - Clinical stage from 08/29/2021: [name], cM1 - Signed by Lanny Callander, MD on 08/29/2021    Rectal cancer (HCC)  07/23/2021 Imaging   CT AP  IMPRESSION:  1. Appearance of the rectum likely indicates a rectal mass suspicious for rectal  carcinoma. Inflammatory process would be less likely. Direct visualization is suggested. 2. Cholelithiasis without evidence of acute cholecystitis. 3. Aortic atherosclerosis.   08/19/2021 Procedure   Colonoscopy, under Dr. Wilhelmenia  Impression: - Rectal tenderness, palpable rectal mass and hemorrhoids found on digital rectal exam. - Stool in the entire examined colon - lavaged with still inadequate clearance. - One 35 mm polyp at the hepatic flexure. Biopsied. Tattooed distal in case future endoscopic resection is considered - however, has larger issues with rectal mass currently as below. - Four, 5 to 11 mm polyps in the transverse colon and at the hepatic flexure, removed with a cold snare. Resected and retrieved. - Diverticulosis in the recto-sigmoid colon and in the sigmoid colon. - Rule out malignancy, partially obstructing tumor in the rectum and from 6 to 13 cm proximal to the anus. Biopsied.   08/19/2021 Pathology Results   Diagnosis 1. Hepatic Flexure Biopsy - TUBULAR ADENOMA WITHOUT HIGH-GRADE DYSPLASIA OR MALIGNANCY 2. Transverse Colon Biopsy, and hepatic flexure, polyps (4) - TUBULAR ADENOMA WITHOUT HIGH-GRADE DYSPLASIA OR MALIGNANCY - OTHER FRAGMENTS OF POLYPOID COLONIC MUCOSA WITH NO SPECIFIC HISTOPATHOLOGIC CHANGES - FOOD MATERIAL 3. Rectum, biopsy - ADENOCARCINOMA. SEE NOTE   08/22/2021 Initial Diagnosis   Rectal cancer (HCC)   08/26/2021 Imaging   IMPRESSION: 1. A 2.2 x 1.9 cm right middle lobe pulmonary nodule as well as a total of four right upper lobe pulmonary nodule and masses measuring up to 3.6 cm. Findings concerning for metastatic primary lung cancer versus less likely metastases in a patient with rectal cancer. Additional imaging evaluation or consultation with Pulmonology or Thoracic Surgery recommended. 2. No gross hilar adenopathy, noting limited sensitivity for the detection of hilar adenopathy on this noncontrast study. 3. Cholelithiasis. 4.   Emphysema (ICD10-J43.9). 5. At least left anterior descending coronary artery calcifications.   08/27/2021 Imaging   IMPRESSION: Rectal adenocarcinoma T stage: T4 B   Rectal adenocarcinoma N stage: N2 disease likely associated with early extra mesorectal lymph node involvement.   Distance from tumor to the internal anal sphincter is 1.2 cm.   Also with extramural venous involvement as described.   08/29/2021 Cancer Staging   Staging form: Colon and Rectum, AJCC 8th Edition - Clinical stage from 08/29/2021: [name], cM1 - Signed by Lanny Callander, MD on 08/29/2021 Stage prefix: Initial diagnosis   01/23/2022 Imaging   CT CAP w contrast IMPRESSION: 1. Mild interval decrease in size of multiple right-sided pulmonary nodules. No new suspicious pulmonary nodule or mass. 2. Interval development of approximately 10 new ill-defined hypoattenuating liver lesions ranging in size from approximately 5-10 mm. Imaging features suspicious for metastatic disease. MRI abdomen with and without contrast may prove helpful to further evaluate. 3. Similar appearance of wall thickening in the rectum with perirectal edema. 4. Cholelithiasis. 5. Prostatomegaly. 6. Aortic Atherosclerosis (ICD10-I70.0).   11/21/2022 Imaging    IMPRESSION: 1. Stable exam. No new or progressive interval findings. 2. The primary rectosigmoid lesion described previously is not well seen on the current study. There is presacral and perirectal edema, likely treatment related. 3. No substantial change in appearance of bilateral pulmonary metastases. 4. Stable tiny hypodensities in both hepatic lobes. These were previously characterized as metastatic disease. No new liver lesions on today's exam. 5. Cholelithiasis. 6. Emphysema (ICD10-J43.9) and Aortic Atherosclerosis (ICD10-170.0)     02/25/2024 Imaging   CT Chest, Abdomen, and Pelvis with contrast  IMPRESSION: Multiple lung  nodules are again identified. These are increased  in size from previous.   Previous nodes however in the mediastinum and right lung hilum are improving.   Fatty infiltration with multiple low-attenuation liver lesions. Many these are slightly larger but more low in density today. Dilated gallbladder with stones.   Persistent wall thickening and nodularity along the rectum with adjacent stranding and fluid. Please correlate with exact location of neoplasm.   Overall mixed response.       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 These are increased in size from previous.  Previous nodes however in the mediastinum and right lung hilum are improving.   Fatty infiltration with multiple low-attenuation liver lesions. Many these are slightly larger but more low in density today.   Dilated gallbladder with stones. Persistent wall thickening and nodularity along the rectum with adjacent stranding and fluid. Please correlate with exact location of neoplasm.   Overall mixed response.       07/17/2024 -  Chemotherapy   Patient is on Treatment Plan : ANAL Nivolumab (480) q28d        Discussed the use of AI scribe software for clinical note transcription with the patient, who gave verbal consent to proceed.  History of Present Illness Jorge [name] is a 73 year old male with metastatic colon cancer who  presents for follow-up. He is accompanied by his daughter, who assists with his grandchildren.  He has been managing metastatic colon cancer for three years. Recent imaging showed liver nodules measuring 1.5 cm, 2.7 cm, 0.5 cm, and 1.5 cm, and lung nodules measuring 2-3 cm and 3-4 cm. Despite being on Stivarga  for four months, the nodules have continued to grow.  He experiences diarrhea about once a week, which he associates with eating late at night. Timing his medication helps prevent diarrhea. He denies other gastrointestinal symptoms such as nausea or vomiting.  He feels tired and sleepy, which he attributes to caring for his grandchildren. He assists his daughter with their care.  He is currently taking gabapentin  for pain management. Initially, two pills were ineffective, but increasing the dose to three pills has provided relief. He is off Syfovre for the week and plans to restart it on Sunday. He confirms having received the prescription at home.  He reports that his blood pressure has been a little high.     All other systems were reviewed with the patient and are negative.  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    SURGICAL HISTORY: Past Surgical History:  Procedure Laterality Date   BRONCHIAL BIOPSY  09/27/2021   Procedure: BRONCHIAL BIOPSIES;  Surgeon: Brenna Adine CROME, DO;  Location: [facility];  Service: Pulmonary;;   BRONCHIAL BRUSHINGS  09/27/2021  Procedure: BRONCHIAL BRUSHINGS;  Surgeon: Brenna Adine CROME, DO;  Location: [facility];  Service: Pulmonary;;   BRONCHIAL NEEDLE ASPIRATION BIOPSY  09/27/2021   Procedure: BRONCHIAL NEEDLE ASPIRATION BIOPSIES;  Surgeon: Brenna Adine CROME, DO;  Location: [facility];  Service: Pulmonary;;   COLONOSCOPY     IR IMAGING GUIDED PORT INSERTION  10/24/2021   left breast surgery Left 1968   VIDEO BRONCHOSCOPY WITH RADIAL  ENDOBRONCHIAL ULTRASOUND  09/27/2021   Procedure: RADIAL ENDOBRONCHIAL ULTRASOUND;  Surgeon: Brenna Adine CROME, DO;  Location: [facility];  Service: Pulmonary;;    I have reviewed the social history and family history with the patient and they are unchanged from previous note.  ALLERGIES:  has no known allergies.  MEDICATIONS:  Current Outpatient Medications  Medication Sig Dispense Refill   amLODipine  (NORVASC ) 10 MG tablet Take 1 tablet (10 mg total) by mouth daily. 90 tablet 1   cabozantinib  (CABOMETYX ) 40 MG tablet Take 1 tablet (40 mg total) by mouth daily. Take on an empty stomach, 1 hour before or 2 hours after meals. 30 tablet 0   diphenoxylate -atropine  (LOMOTIL ) 2.5-0.025 MG tablet TAKE 1-2 TABLETS BY MOUTH 4 (FOUR) TIMES DAILY AS NEEDED FOR DIARRHEA OR LOOSE STOOLS. 120 tablet 0   gabapentin  (NEURONTIN ) 100 MG capsule TAKE 2 CAPSULES BY MOUTH 3 TIMES DAILY. 180 capsule 2   metoprolol  tartrate (LOPRESSOR ) 25 MG tablet TAKE 1 TABLET BY MOUTH TWICE A DAY 180 tablet 1   Multiple Vitamin (MULTIVITAMIN WITH MINERALS) TABS tablet Take 1 tablet by mouth daily.     ondansetron  (ZOFRAN ) 8 MG tablet Take 1 tablet (8 mg total) by mouth every 8 (eight) hours as needed for nausea or vomiting. 30 tablet 3   oxyCODONE  (OXY IR/ROXICODONE ) 5 MG immediate release tablet Take 1 tablet (5 mg total) by mouth every 6 (six) hours as needed for severe pain (pain score 7-10) or breakthrough pain. 60 tablet 0   pantoprazole  (PROTONIX ) 40 MG tablet Take 1 tablet (40 mg total) by mouth daily before breakfast. 90 tablet 0   prochlorperazine  (COMPAZINE ) 10 MG tablet Take 1 tablet (10 mg total) by mouth every 6 (six) hours as needed (Nausea or vomiting). 30 tablet 1   Pseudoephedrine-APAP-DM (DAYQUIL PO) Take 1 tablet by mouth as needed.     regorafenib  (STIVARGA ) 40 MG tablet Take 4 tablets (160 mg total) by mouth daily for 21 days on, 7 days off. Repeat every 28 days. 84 tablet 3   No current  facility-administered medications for this visit.    PHYSICAL EXAMINATION: ECOG PERFORMANCE STATUS: 1 - Symptomatic but completely ambulatory  Vitals:   07/08/24 1351 07/08/24 1358  BP: (!) 146/82 (!) 144/78  Pulse: (!) 107   Resp: 18   Temp: 97.6 F (36.4 C)   SpO2: 100%    Wt Readings from Last 3 Encounters:  07/08/24 139 lb 6.4 oz (63.2 kg)  06/03/24 140 lb 14.4 oz (63.9 kg)  05/05/24 141 lb 6 oz (64.1 kg)     GENERAL:alert, no distress and comfortable SKIN: skin color, texture, turgor are normal, no rashes or significant lesions EYES: normal, Conjunctiva are pink and non-injected, sclera clear NECK: supple, thyroid normal size, non-tender, without nodularity LYMPH:  no palpable lymphadenopathy in the cervical, axillary  LUNGS: clear to auscultation and percussion with normal breathing effort HEART: regular rate & rhythm and no murmurs and no lower extremity edema ABDOMEN:abdomen soft, non-tender and normal bowel sounds Musculoskeletal:no cyanosis of digits and no clubbing  NEURO: alert &  oriented x 3 with fluent speech, no focal motor/sensory deficits  Physical Exam    LABORATORY DATA:  I have reviewed the data as listed    Latest Ref Rng & Units 07/01/2024    8:22 AM 06/03/2024    9:32 AM 05/05/2024    9:02 AM  CBC  WBC 4.0 - 10.5 K/uL 4.5  4.5  5.1   Hemoglobin 13.0 - 17.0 g/dL 83.5  84.2  84.8   Hematocrit 39.0 - 52.0 % 49.8  48.0  46.6   Platelets 150 - 400 K/uL 201  143  176         Latest Ref Rng & Units 07/01/2024    8:22 AM 06/03/2024    9:32 AM 05/05/2024    9:02 AM  CMP  Glucose 70 - 99 mg/dL 894  891  838   BUN 8 - 23 mg/dL 7  7  9    Creatinine 0.61 - 1.24 mg/dL 9.23  9.27  9.04   Sodium 135 - 145 mmol/L 134  138  138   Potassium 3.5 - 5.1 mmol/L 3.8  3.3  4.0   Chloride 98 - 111 mmol/L 100  103  103   CO2 22 - 32 mmol/L 30  32  31   Calcium  8.9 - 10.3 mg/dL 8.4  8.3  8.6   Total Protein 6.5 - 8.1 g/dL 6.7  6.7  6.8   Total Bilirubin 0.0 -  1.2 mg/dL 0.5  0.5  0.7   Alkaline Phos 38 - 126 U/L 121  115  109   AST 15 - 41 U/L 40  34  27   ALT 0 - 44 U/L 25  18  18        RADIOGRAPHIC STUDIES: I have personally reviewed the radiological images as listed and agreed with the findings in the report. No results found.    Orders Placed This Encounter  Procedures   Consent Attestation for Oncology Treatment    The patient is informed of risks, benefits, side-effects of the prescribed oncology treatment. Potential short term and long term side effects and response rates discussed. After a long discussion, the patient made informed decision to proceed.:   Yes   CBC with Differential (Cancer Center Only)    Standing Status:   Future    Expected Date:   07/17/2024    Expiration Date:   07/17/2025   CMP (Cancer Center only)    Standing Status:   Future    Expected Date:   07/17/2024    Expiration Date:   07/17/2025   T4    Standing Status:   Future    Expected Date:   07/17/2024    Expiration Date:   07/17/2025   TSH    Standing Status:   Future    Expected Date:   07/17/2024    Expiration Date:   07/17/2025   CBC with Differential (Cancer Center Only)    Standing Status:   Future    Expected Date:   08/14/2024    Expiration Date:   08/14/2025   CMP (Cancer Center only)    Standing Status:   Future    Expected Date:   08/14/2024    Expiration Date:   08/14/2025   CBC with Differential (Cancer Center Only)    Standing Status:   Future    Expected Date:   09/11/2024    Expiration Date:   09/11/2025   CMP (Cancer Center only)    Standing Status:  Future    Expected Date:   09/11/2024    Expiration Date:   09/11/2025   T4    Standing Status:   Future    Expected Date:   09/11/2024    Expiration Date:   09/11/2025   TSH    Standing Status:   Future    Expected Date:   09/11/2024    Expiration Date:   09/11/2025   PHYSICIAN COMMUNICATION ORDER    Thyroid function tests at baseline and every 3rd cycle.   All questions were answered.  The patient knows to call the clinic with any problems, questions or concerns. No barriers to learning was detected. The total time spent in the appointment was 40 minutes, including review of chart and various tests results, discussions about plan of care and coordination of care plan     Onita Mattock, MD 07/08/2024

## 2024-07-09 ENCOUNTER — Other Ambulatory Visit (HOSPITAL_COMMUNITY): Payer: Self-pay

## 2024-07-09 ENCOUNTER — Telehealth: Payer: Self-pay

## 2024-07-09 ENCOUNTER — Other Ambulatory Visit: Payer: Self-pay | Admitting: Nurse Practitioner

## 2024-07-09 ENCOUNTER — Telehealth: Payer: Self-pay | Admitting: Pharmacist

## 2024-07-09 ENCOUNTER — Other Ambulatory Visit: Payer: Self-pay | Admitting: Hematology

## 2024-07-09 DIAGNOSIS — G893 Neoplasm related pain (acute) (chronic): Secondary | ICD-10-CM

## 2024-07-09 DIAGNOSIS — C20 Malignant neoplasm of rectum: Secondary | ICD-10-CM

## 2024-07-09 DIAGNOSIS — Z515 Encounter for palliative care: Secondary | ICD-10-CM

## 2024-07-09 DIAGNOSIS — M792 Neuralgia and neuritis, unspecified: Secondary | ICD-10-CM

## 2024-07-09 DIAGNOSIS — R197 Diarrhea, unspecified: Secondary | ICD-10-CM

## 2024-07-09 NOTE — Telephone Encounter (Signed)
 [facility]    Oncology Clinical Pharmacist Practitioner Initial Assessment  Received new prescription for cabozantinib  for the treatment of rectal cancer. This is being given in combination with nivolumab. It is planned to continue until disease progression or unacceptable toxicity.  Labs from 07/01/24 assessed. Prescription dose and frequency assessed. Dr. Lanny will be treating patient per the trial data from Lang J, et al. Cabozantinib  sensitizes microsatellite stable colorectal cancer to immune checkpoint blockade by immune modulation in human immune system mouse models. Front Oncol. 2022 Nov 7;12:877635.   Current medication list in Epic reviewed. Significant DDIs with cabozantinib  identified:No. Will discontinue regorafenib  per Dr. Lanny as patient will no longer be taking this agent.  Evaluated chart, patient barriers to medication adherence identified: No.  Patient agreement for treatment documented in MD note on 07/08/24.  Prescription has been e-scribed to the [facility]) for benefits analysis and approval.  Oral Oncology Clinic will continue to follow for insurance authorization, copayment issues, initial counseling and start date.  Densil Ottey A. Lucila, PharmD, BCOP, CPP Hematology-Oncology Clinical Pharmacist Practitioner  07/09/2024 2:18 PM  **Disclaimer: This note was dictated with voice recognition software. Similar sounding words can inadvertently be transcribed and this note may contain transcription errors which may not have been corrected upon publication of note.**

## 2024-07-09 NOTE — Telephone Encounter (Signed)
 Oral Oncology Patient Advocate Encounter   Received notification that prior authorization for Cabometyx  is required.   PA submitted on 06/08/24 Key [id number] Status is pending      Charlott Hamilton,  CPhT-Adv  she/her/hers [facility]  [facility] Pharmacy Technician Patient Advocate Specialist III [facility] Phone: [phone]  Fax: [phone] [email or web address]

## 2024-07-09 NOTE — Progress Notes (Signed)
 Pharmacist Chemotherapy Monitoring - Initial Assessment    Anticipated start date: 07/15/24   The following has been reviewed per standard work regarding the patient's treatment regimen: The patient's diagnosis, treatment plan and drug doses, and organ/hematologic function Lab orders and baseline tests specific to treatment regimen  The treatment plan start date, drug sequencing, and pre-medications Prior authorization status  Patient's documented medication list, including drug-drug interaction screen and prescriptions for anti-emetics and supportive care specific to the treatment regimen The drug concentrations, fluid compatibility, administration routes, and timing of the medications to be used The patient's access for treatment and lifetime cumulative dose history, if applicable  The patient's medication allergies and previous infusion related reactions, if applicable   Changes made to treatment plan:  Tx plan date change  Follow up needed:  Pt still has regorafenib  on med list; confirm this stopped. PA pending   Wilma Dollar, Pharm.D., CPP 07/09/2024@11 :09 AM

## 2024-07-10 ENCOUNTER — Other Ambulatory Visit: Payer: Self-pay

## 2024-07-10 ENCOUNTER — Encounter: Payer: Self-pay | Admitting: Hematology

## 2024-07-10 ENCOUNTER — Other Ambulatory Visit (HOSPITAL_COMMUNITY): Payer: Self-pay

## 2024-07-10 ENCOUNTER — Telehealth: Payer: Self-pay | Admitting: Pharmacist

## 2024-07-10 NOTE — Telephone Encounter (Signed)
 Oral Oncology Patient Advocate Encounter  Prior Authorization for Cabometyx  has been approved.    Key [id number]  Effective dates: 07/10/2024 through N/A  Patients co-pay is $43.00.     Charlott Hamilton,  CPhT-Adv  she/her/hers [facility]  [facility] Pharmacy Technician Patient Advocate Specialist III [facility] Phone: [phone]  Fax: [phone] [email or web address]

## 2024-07-10 NOTE — Progress Notes (Signed)
 Specialty Pharmacy Initial Fill Coordination Note  [name] is a 73 y.o. male contacted today regarding refills of specialty medication(s) Cabozantinib  S-Malate (CABOMETYX ) .  Patient requested Delivery  on 07/14/24  to verified address [address]  [place] [address]   Medication will be filled on 07/11/2024.   Patient is aware of $12.15 copayment.

## 2024-07-10 NOTE — Progress Notes (Signed)
 Patient counseled in clinic telephone visit note on 07/10/24

## 2024-07-10 NOTE — Telephone Encounter (Signed)
 [facility]       Telephone: [phone]?Fax: [phone]   Oncology Clinical Pharmacist Practitioner Initial Assessment  Jorge [name] is a 73 y.o. male with a diagnosis of rectal cancer. They were contacted today via telephone visit. Spoke with Jorge [name] (dtr and POA).  Indication/Regimen Cabozantinib  (Cabometyx ) is being used appropriately for treatment of rectal cancer by Dr. Onita Mattock.      Wt Readings from Last 1 Encounters:  07/08/24 139 lb 6.4 oz (63.2 kg)    Estimated body surface area is 1.79 meters squared as calculated from the following:   Height as of 07/08/24: 6' (1.829 m).   Weight as of 07/08/24: 139 lb 6.4 oz (63.2 kg).  The dosing regimen is 40 mg by mouth daily on days 1 to 28 of a 28-day cycle. This is being given  in combination with nivolumab 480 mg every 28 days. It is planned to continue until disease progression or unacceptable toxicity. Prescription dose and frequency assessed for appropriateness. The treatment goal is: Control.  Patient has agreed to treatment which is documented in physician note on 07/09/24. Counseled patient on administration, dosing, side effects, monitoring, drug-food interactions, safe handling, storage, and disposal.  Delivery will be on 07/14/24 and patient will start 07/15/24 per Dr. Demetra instructions.   Dose Modifications None   Access Assessment Jorge [name] will be receiving cabozantinib  through [facility] Concerns: none Start date if known: 07/15/24  Adherence Assessment Reviewed importance on keeping a med schedule and plan for any missed doses Barriers to adherence identified? No  Communication and Learning Assessment Primary learner: patient Barriers to learning: No barriers Preferred language: English Learning preferences: Listening   Allergies No Known Allergies  Vitals    07/08/2024    1:58 PM 07/08/2024    1:51 PM 06/03/2024   10:00 AM  Oncology Vitals  Height   183 cm   Weight  63.231 kg   Weight (lbs)  139 lbs 6 oz   BMI  18.91 kg/m2   Temp  97.6 F (36.4 C)   Pulse Rate  107   BP 144/78 146/82 151/94  Resp  18   SpO2  100 %   BSA (m2)  1.79 m2      Laboratory Data    Latest Ref Rng & Units 07/01/2024    8:22 AM 06/03/2024    9:32 AM 05/05/2024    9:02 AM  CBC EXTENDED  WBC 4.0 - 10.5 K/uL 4.5  4.5  5.1   RBC 4.22 - 5.81 MIL/uL 5.50  5.25  4.97   Hemoglobin 13.0 - 17.0 g/dL 83.5  84.2  84.8   HCT 39.0 - 52.0 % 49.8  48.0  46.6   Platelets 150 - 400 K/uL 201  143  176   NEUT# 1.7 - 7.7 K/uL 2.6  3.1  3.2   Lymph# 0.7 - 4.0 K/uL 1.3  1.0  1.0        Latest Ref Rng & Units 07/01/2024    8:22 AM 06/03/2024    9:32 AM 05/05/2024    9:02 AM  CMP  Glucose 70 - 99 mg/dL 894  891  838   BUN 8 - 23 mg/dL 7  7  9    Creatinine 0.61 - 1.24 mg/dL 9.23  9.27  9.04   Sodium 135 - 145 mmol/L 134  138  138   Potassium 3.5 - 5.1 mmol/L 3.8  3.3  4.0  Chloride 98 - 111 mmol/L 100  103  103   CO2 22 - 32 mmol/L 30  32  31   Calcium  8.9 - 10.3 mg/dL 8.4  8.3  8.6   Total Protein 6.5 - 8.1 g/dL 6.7  6.7  6.8   Total Bilirubin 0.0 - 1.2 mg/dL 0.5  0.5  0.7   Alkaline Phos 38 - 126 U/L 121  115  109   AST 15 - 41 U/L 40  34  27   ALT 0 - 44 U/L 25  18  18     Lab Results  Component Value Date   MG 2.1 11/27/2023   MG 2.3 11/26/2023   MG 2.4 11/24/2023    Contraindications Contraindications were reviewed? Yes Contraindications to therapy were identified? No   Safety Precautions The following safety precautions for the use of cabozantinib  were reviewed:  Fever: reviewed the importance of having a thermometer and the Centers for Disease Control and Prevention (CDC) definition of fever which is 100.33F (38C) or higher. Patient should call 24/7 triage at [phone] if experiencing a fever or any other symptoms Bowel perforation Changes in electrolytes and other laboratory values Changes in kidney function Changes in liver  function Decreased appetite or weight loss Taste changes Decreased hemoglobin, part of the red blood cells that carry iron and oxygen Decreased platelet count and increased risk for bleeding Decreased white blood cells (WBCs) and increased risk for infection Diarrhea (loose and/or urgent bowel movements) Fatigue Hair color changes Increased blood pressure -- take your blood pressure on a regular basis, and let your provider know of any large increases in blood pressure Increased risk for bleeding Mouth irritation or sores Nausea or vomiting Pain or discomfort in hands and/or feet Posterior reversible leukoencephalopathy syndrome (PRES) -- severe headache, seizures, confusion, changes in vision VTE Problems with your thyroid May damage adrenal glands (if taking in combination with nivolumab) Take on an empty stomach at least 1 hour before or at least 2 hours after eating at the same time each day. Take with a full glass of water (at least 8 ounces) Missed doses -- do not take the missed dose if it has been more than 12 hours since you should have taken it. Simply take the next dose at the regularly scheduled time If you need to have surgical or dental procedure, tell your care provider you are taking cabozantinib . Cabozantinib  may need to be stopped until your wound heals for some surgeries Avoid grapefruit products Handing body fluids and waste Pregnancy, sexual activity, and contraception Do not breastfeed while taking cabozantinib  and for 4 months after the last dose of cabozantinib  Storage and handling  Medication Reconciliation Current Outpatient Medications  Medication Sig Dispense Refill   amLODipine  (NORVASC ) 10 MG tablet Take 1 tablet (10 mg total) by mouth daily. 90 tablet 1   gabapentin  (NEURONTIN ) 100 MG capsule TAKE 2 CAPSULES BY MOUTH 3 TIMES DAILY. 180 capsule 2   oxyCODONE  (OXY IR/ROXICODONE ) 5 MG immediate release tablet Take 1 tablet (5 mg total) by mouth every 6  (six) hours as needed for severe pain (pain score 7-10) or breakthrough pain. 60 tablet 0   cabozantinib  (CABOMETYX ) 40 MG tablet Take 1 tablet (40 mg total) by mouth daily. Take on an empty stomach, 1 hour before or 2 hours after meals. (Patient not taking: Reported on 07/10/2024) 30 tablet 0   diphenoxylate -atropine  (LOMOTIL ) 2.5-0.025 MG tablet TAKE 1-2 TABLETS BY MOUTH 4 TIMES DAILY AS NEEDED FOR DIARRHEA  OR LOOSE STOOLS. (Patient not taking: Reported on 07/10/2024) 120 tablet 0   Multiple Vitamin (MULTIVITAMIN WITH MINERALS) TABS tablet Take 1 tablet by mouth daily.     No current facility-administered medications for this visit.    Medication reconciliation is based on the patient's most recent medication list in the electronic medical record (EMR) including herbal products and OTC medications.   The patient's medication list was reviewed today with the patient? Yes   Drug-drug interactions (DDIs) DDIs were evaluated? Yes Significant DDIs identified? No   Drug-Food Interactions Drug-food interactions were evaluated? Yes Drug-food interactions identified? Avoid grapefruit products  Follow-up Plan  Patient education handout given to patient Start cabozantinib  (Cabometyx ) 40 mg by mouth daily. Take with a full glass of water 1 hour before or 2 hours after eating. Start 07/15/24 per Dr.Feng. Will be delivered 07/14/24 Start nivolumab 480 mg IV every 28 days. Start 07/15/24 Monitor for side effects Distress assessment N/A as previous lines of therapy completed Jorge [name] can follow up with clinical pharmacy as deemed necessary by Dr. Onita Mattock going forward   Jorge [name] participated in the discussion, expressed understanding, and voiced agreement with the above plan. All questions were answered to their satisfaction. The patient was advised to contact the clinic at [phone] with any questions or concerns prior to their return visit.   I spent 30 minutes assessing the  patient.  Eliodoro Gullett A. [name], PharmD, BCOP, CPP  Jorge [name], RPH-CPP, 07/10/2024 3:12 PM  **Disclaimer: This note was dictated with voice recognition software. Similar sounding words can inadvertently be transcribed and this note may contain transcription errors which may not have been corrected upon publication of note.**

## 2024-07-15 ENCOUNTER — Inpatient Hospital Stay

## 2024-07-15 ENCOUNTER — Inpatient Hospital Stay (HOSPITAL_BASED_OUTPATIENT_CLINIC_OR_DEPARTMENT_OTHER): Admitting: Hematology

## 2024-07-15 VITALS — BP 157/86 | HR 84 | Temp 98.7°F | Resp 18

## 2024-07-15 VITALS — BP 150/90 | HR 87 | Temp 98.6°F | Resp 19 | Ht 72.0 in | Wt 135.6 lb

## 2024-07-15 DIAGNOSIS — Z5112 Encounter for antineoplastic immunotherapy: Secondary | ICD-10-CM | POA: Diagnosis not present

## 2024-07-15 DIAGNOSIS — C7801 Secondary malignant neoplasm of right lung: Secondary | ICD-10-CM | POA: Diagnosis not present

## 2024-07-15 DIAGNOSIS — C787 Secondary malignant neoplasm of liver and intrahepatic bile duct: Secondary | ICD-10-CM | POA: Diagnosis not present

## 2024-07-15 DIAGNOSIS — C20 Malignant neoplasm of rectum: Secondary | ICD-10-CM

## 2024-07-15 DIAGNOSIS — Z79620 Long term (current) use of immunosuppressive biologic: Secondary | ICD-10-CM | POA: Diagnosis not present

## 2024-07-15 LAB — CMP (CANCER CENTER ONLY)
ALT: 20 U/L (ref 0–44)
AST: 25 U/L (ref 15–41)
Albumin: 3 g/dL — ABNORMAL LOW (ref 3.5–5.0)
Alkaline Phosphatase: 139 U/L — ABNORMAL HIGH (ref 38–126)
Anion gap: 2 — ABNORMAL LOW (ref 5–15)
BUN: 7 mg/dL — ABNORMAL LOW (ref 8–23)
CO2: 33 mmol/L — ABNORMAL HIGH (ref 22–32)
Calcium: 8.5 mg/dL — ABNORMAL LOW (ref 8.9–10.3)
Chloride: 103 mmol/L (ref 98–111)
Creatinine: 0.66 mg/dL (ref 0.61–1.24)
GFR, Estimated: >60 mL/min (ref >60)
Glucose, Bld: 113 mg/dL — ABNORMAL HIGH (ref 70–99)
Potassium: 3.5 mmol/L (ref 3.5–5.1)
Sodium: 138 mmol/L (ref 135–145)
Total Bilirubin: 0.3 mg/dL (ref 0.0–1.2)
Total Protein: 6.7 g/dL (ref 6.5–8.1)

## 2024-07-15 LAB — CBC WITH DIFFERENTIAL (CANCER CENTER ONLY)
Abs Immature Granulocytes: 0.01 K/uL (ref 0.00–0.07)
Basophils Absolute: 0 K/uL (ref 0.0–0.1)
Basophils Relative: 1 %
Eosinophils Absolute: 0.3 K/uL (ref 0.0–0.5)
Eosinophils Relative: 7 %
HCT: 43 % (ref 39.0–52.0)
Hemoglobin: 14 g/dL (ref 13.0–17.0)
Immature Granulocytes: 0 %
Lymphocytes Relative: 29 %
Lymphs Abs: 1.1 K/uL (ref 0.7–4.0)
MCH: 29.4 pg (ref 26.0–34.0)
MCHC: 32.6 g/dL (ref 30.0–36.0)
MCV: 90.1 fL (ref 80.0–100.0)
Monocytes Absolute: 0.5 K/uL (ref 0.1–1.0)
Monocytes Relative: 13 %
Neutro Abs: 2 K/uL (ref 1.7–7.7)
Neutrophils Relative %: 50 %
Platelet Count: 148 K/uL — ABNORMAL LOW (ref 150–400)
RBC: 4.77 MIL/uL (ref 4.22–5.81)
RDW: 13.9 % (ref 11.5–15.5)
WBC Count: 4 K/uL (ref 4.0–10.5)
nRBC: 0 % (ref 0.0–0.2)

## 2024-07-15 MED ORDER — SODIUM CHLORIDE 0.9% FLUSH
10.0000 mL | INTRAVENOUS | Status: DC | PRN
  Administered 2024-07-15: 10 mL

## 2024-07-15 MED ORDER — SODIUM CHLORIDE 0.9 % IV SOLN
480.0000 mg | Freq: Once | INTRAVENOUS | Status: AC
  Administered 2024-07-15: 480 mg via INTRAVENOUS
  Filled 2024-07-15: qty 48

## 2024-07-15 MED ORDER — SODIUM CHLORIDE 0.9 % IV SOLN: INTRAVENOUS | Status: DC

## 2024-07-15 NOTE — Assessment & Plan Note (Signed)
 [id number] with lung metastasis, MMR normal, KRAS(+) [id number]  -diagnosed 08/2021 by colonoscopy for rectal bleeding and frequent BM. Staging MRI showed early extra mesorectal lymph node involvement.  -baseline CEA 571.84 on 09/06/21.  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 Xeloda  dose reduced due to elevated liver enzymes.  -due to new liver lesions, we switched to second-line FOLFIRI, with continued beva, on 02/21/22. He tolerates well overall.  - restaging CT scan on April 22 and June 26 showed stable disease overall. -CT 10/09/2023 showed mild disease progression in lungs, his tumor marker CEA has been going up also.  -I changed his chemo to lonsurf  and beva, he started on 12/13/2023, unfortunately progressed on CT 02/25/2024 -treatment changed to Stivarga . He required drug assistance  -restaging CT 07/01/2024 showed disease progression  -will change tx to Nivo and cabozantinib , plan to start on 07/15/24

## 2024-07-15 NOTE — Patient Instructions (Signed)
 [facility] - A DEPT OF [facility]  Discharge Instructions: Thank you for choosing [facility] to provide your oncology and hematology care.   If you have a lab appointment with the Cancer Center, please go directly to the Cancer Center and check in at the registration area.   Wear comfortable clothing and clothing appropriate for easy access to any Portacath or PICC line.   We strive to give you quality time with your provider. You may need to reschedule your appointment if you arrive late (15 or more minutes).  Arriving late affects you and other patients whose appointments are after yours.  Also, if you miss three or more appointments without notifying the office, you may be dismissed from the clinic at the provider's discretion.      For prescription refill requests, have your pharmacy contact our office and allow 72 hours for refills to be completed.    Today you received the following chemotherapy and/or immunotherapy agents: Opdivo  (nivolumab )      To help prevent nausea and vomiting after your treatment, we encourage you to take your nausea medication as directed.  BELOW ARE SYMPTOMS THAT SHOULD BE REPORTED IMMEDIATELY: *FEVER GREATER THAN 100.4 F (38 C) OR HIGHER *CHILLS OR SWEATING *NAUSEA AND VOMITING THAT IS NOT CONTROLLED WITH YOUR NAUSEA MEDICATION *UNUSUAL SHORTNESS OF BREATH *UNUSUAL BRUISING OR BLEEDING *URINARY PROBLEMS (pain or burning when urinating, or frequent urination) *BOWEL PROBLEMS (unusual diarrhea, constipation, pain near the anus) TENDERNESS IN MOUTH AND THROAT WITH OR WITHOUT PRESENCE OF ULCERS (sore throat, sores in mouth, or a toothache) UNUSUAL RASH, SWELLING OR PAIN  UNUSUAL VAGINAL DISCHARGE OR ITCHING   Items with * indicate a potential emergency and should be followed up as soon as possible or go to the Emergency Department if any problems should occur.  Please show the CHEMOTHERAPY ALERT CARD or  IMMUNOTHERAPY ALERT CARD at check-in to the Emergency Department and triage nurse.  Should you have questions after your visit or need to cancel or reschedule your appointment, please contact [facility] - A DEPT OF [facility]  Dept: [phone]  and follow the prompts.  Office hours are 8:00 a.m. to 4:30 p.m. Monday - Friday. Please note that voicemails left after 4:00 p.m. may not be returned until the following business day.  We are closed weekends and major holidays. You have access to a nurse at all times for urgent questions. Please call the main number to the clinic Dept: [phone] and follow the prompts.   For any non-urgent questions, you may also contact your provider using MyChart. We now offer e-Visits for anyone 67 and older to request care online for non-urgent symptoms. For details visit [email or web address].   Also download the MyChart app! Go to the app store, search MyChart, open the app, select [facility], and log in with your MyChart username and password.  Nivolumab  Injection What is this medication? NIVOLUMAB  (nye VOL ue mab) treats some types of cancer. It works by helping your immune system slow or stop the spread of cancer cells. It is a monoclonal antibody. This medicine may be used for other purposes; ask your health care provider or pharmacist if you have questions. COMMON BRAND NAME(S): Opdivo  What should I tell my care team before I take this medication? They need to know if you have any of these conditions: Allogeneic stem cell transplant (uses someone else's stem cells)  Autoimmune diseases, such as Crohn disease, ulcerative colitis, lupus History of chest radiation Nervous system problems, such as Guillain-Barre syndrome or myasthenia gravis Organ transplant An unusual or allergic reaction to nivolumab , other medications, foods, dyes, or preservatives Pregnant or trying to get pregnant Breast-feeding How should I use  this medication? This medication is infused into a vein. It is given in a hospital or clinic setting. A special MedGuide will be given to you before each treatment. Be sure to read this information carefully each time. Talk to your care team about the use of this medication in children. While it may be prescribed for children as young as 12 years for selected conditions, precautions do apply. Overdosage: If you think you have taken too much of this medicine contact a poison control center or emergency room at once. NOTE: This medicine is only for you. Do not share this medicine with others. What if I miss a dose? Keep appointments for follow-up doses. It is important not to miss your dose. Call your care team if you are unable to keep an appointment. What may interact with this medication? Interactions have not been studied. This list may not describe all possible interactions. Give your health care provider a list of all the medicines, herbs, non-prescription drugs, or dietary supplements you use. Also tell them if you smoke, drink alcohol, or use illegal drugs. Some items may interact with your medicine. What should I watch for while using this medication? Your condition will be monitored carefully while you are receiving this medication. You may need blood work while taking this medication. This medication may cause serious skin reactions. They can happen weeks to months after starting the medication. Contact your care team right away if you notice fevers or flu-like symptoms with a rash. The rash may be red or purple and then turn into blisters or peeling of the skin. You may also notice a red rash with swelling of the face, lips, or lymph nodes in your neck or under your arms. Tell your care team right away if you have any change in your eyesight. Talk to your care team if you are pregnant or think you might be pregnant. A negative pregnancy test is required before starting this medication. A  reliable form of contraception is recommended while taking this medication and for 5 months after the last dose. Talk to your care team about effective forms of contraception. Do not breast-feed while taking this medication and for 5 months after the last dose. What side effects may I notice from receiving this medication? Side effects that you should report to your care team as soon as possible: Allergic reactions--skin rash, itching, hives, swelling of the face, lips, tongue, or throat Dry cough, shortness of breath or trouble breathing Eye pain, redness, irritation, or discharge with blurry or decreased vision Heart muscle inflammation--unusual weakness or fatigue, shortness of breath, chest pain, fast or irregular heartbeat, dizziness, swelling of the ankles, feet, or hands Hormone gland problems--headache, sensitivity to light, unusual weakness or fatigue, dizziness, fast or irregular heartbeat, increased sensitivity to cold or heat, excessive sweating, constipation, hair loss, increased thirst or amount of urine, tremors or shaking, irritability Infusion reactions--chest pain, shortness of breath or trouble breathing, feeling faint or lightheaded Kidney injury (glomerulonephritis)--decrease in the amount of urine, red or dark brown urine, foamy or bubbly urine, swelling of the ankles, hands, or feet Liver injury--right upper belly pain, loss of appetite, nausea, light-colored stool, dark yellow or brown urine, yellowing skin  or eyes, unusual weakness or fatigue Pain, tingling, or numbness in the hands or feet, muscle weakness, change in vision, confusion or trouble speaking, loss of balance or coordination, trouble walking, seizures Rash, fever, and swollen lymph nodes Redness, blistering, peeling, or loosening of the skin, including inside the mouth Sudden or severe stomach pain, bloody diarrhea, fever, nausea, vomiting Side effects that usually do not require medical attention (report these  to your care team if they continue or are bothersome): Bone, joint, or muscle pain Diarrhea Fatigue Loss of appetite Nausea Skin rash This list may not describe all possible side effects. Call your doctor for medical advice about side effects. You may report side effects to FDA at 1-800-FDA-1088. Where should I keep my medication? This medication is given in a hospital or clinic. It will not be stored at home. NOTE: This sheet is a summary. It may not cover all possible information. If you have questions about this medicine, talk to your doctor, pharmacist, or health care provider.  2024 Elsevier/Gold Standard (2022-02-20 00:00:00)

## 2024-07-15 NOTE — Progress Notes (Signed)
 [facility]   [phone] Fax:([phone]) [phone]   Clinic Follow up Note   Patient Care Team: Petrina Pries, NP as PCP - General (Family Medicine) Croitoru, Jerel, MD as PCP - Cardiology (Cardiology) Pickenpack-Cousar, Fannie SAILOR, NP as Nurse Practitioner (Nurse Practitioner) Lanny Callander, MD as Consulting Physician (Hematology)  Date of Service:  07/15/2024  CHIEF COMPLAINT: f/u of metastatic rectal cancer  CURRENT THERAPY:  Cabozantinib  and nivolumab , starting today  Oncology History   Rectal cancer (HCC) [phone] with lung metastasis, MMR normal, KRAS(+) [id number]  -diagnosed 08/2021 by colonoscopy for rectal bleeding and frequent BM. Staging MRI showed early extra mesorectal lymph node involvement.  -baseline CEA 571.84 on 09/06/21.  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 Xeloda  dose reduced due to elevated liver enzymes.  -due to new liver lesions, we switched to second-line FOLFIRI, with continued beva, on 02/21/22. He tolerates well overall.  - restaging CT scan on April 22 and June 26 showed stable disease overall. -CT 10/09/2023 showed mild disease progression in lungs, his tumor marker CEA has been going up also.  -I changed his chemo to lonsurf  and beva, he started on 12/13/2023, unfortunately progressed on CT 02/25/2024 -treatment changed to Stivarga . He required drug assistance  -restaging CT 07/01/2024 showed disease progression  -will change tx to Nivo and cabozantinib , plan to start on 07/15/24   Assessment & Plan Metastatic colorectal cancer with liver metastases Currently on a treatment regimen combining oral togazepine and intravenous nivolumab . Previous chemotherapy treatments have been extensive. The current treatment has shown a 40% response rate in studies, indicating a reasonable chance of efficacy. He is  starting nivolumab  infusion today. Oral togazepine may cause hypertension, which is a concern given his current hypertension. - Administer nivolumab  infusion today - Instruct to call the clinic if any problems arise  Diarrhea Intermittent diarrhea, occurring approximately once a day. Symptoms are not severe but are bothersome.  Unintentional weight loss Reports weight loss, possibly related to diarrhea and dietary changes. Weight loss is a concern, and there is a desire to regain weight. - Encourage weight gain and monitor weight changes  Hypertension Hypertension with high blood pressure readings on recent visits. Currently on amlodipine  10 mg, which is the maximum dose. No primary care physician is currently managing hypertension. He prefers to monitor blood pressure at home rather than starting a new medication. - Monitor blood pressure at home and maintain a log - Bring blood pressure log to the next appointment for review  Plan - He started cabozantinib  at this morning, tolerated first dose well - Will proceed to first dose nivolumab  today and continue every 4 weeks - Lab and follow-up in 2 weeks   SUMMARY OF ONCOLOGIC HISTORY: Oncology History Overview Note  Cancer Staging Rectal cancer [facility]) Staging form: Colon and Rectum, AJCC 8th Edition - Clinical stage from 08/29/2021: Jorge [name], cM1 - Signed by Lanny Callander, MD on 08/29/2021    Rectal cancer (HCC)  07/23/2021 Imaging   CT AP  IMPRESSION: 1. Appearance of the rectum likely indicates a rectal mass suspicious for rectal carcinoma. Inflammatory process would be less likely. Direct visualization is suggested. 2. Cholelithiasis without evidence of acute cholecystitis. 3. Aortic atherosclerosis.   08/19/2021 Procedure   Colonoscopy, under Dr. Wilhelmenia  Impression: - Rectal tenderness, palpable rectal mass and hemorrhoids found on digital rectal exam. - Stool in the entire examined  colon - lavaged with still inadequate  clearance. - One 35 mm polyp at the hepatic flexure. Biopsied. Tattooed distal in case future endoscopic resection is considered - however, has larger issues with rectal mass currently as below. - Four, 5 to 11 mm polyps in the transverse colon and at the hepatic flexure, removed with a cold snare. Resected and retrieved. - Diverticulosis in the recto-sigmoid colon and in the sigmoid colon. - Rule out malignancy, partially obstructing tumor in the rectum and from 6 to 13 cm proximal to the anus. Biopsied.   08/19/2021 Pathology Results   Diagnosis 1. Hepatic Flexure Biopsy - TUBULAR ADENOMA WITHOUT HIGH-GRADE DYSPLASIA OR MALIGNANCY 2. Transverse Colon Biopsy, and hepatic flexure, polyps (4) - TUBULAR ADENOMA WITHOUT HIGH-GRADE DYSPLASIA OR MALIGNANCY - OTHER FRAGMENTS OF POLYPOID COLONIC MUCOSA WITH NO SPECIFIC HISTOPATHOLOGIC CHANGES - FOOD MATERIAL 3. Rectum, biopsy - ADENOCARCINOMA. SEE NOTE   08/22/2021 Initial Diagnosis   Rectal cancer (HCC)   08/26/2021 Imaging   IMPRESSION: 1. A 2.2 x 1.9 cm right middle lobe pulmonary nodule as well as a total of four right upper lobe pulmonary nodule and masses measuring up to 3.6 cm. Findings concerning for metastatic primary lung cancer versus less likely metastases in a patient with rectal cancer. Additional imaging evaluation or consultation with Pulmonology or Thoracic Surgery recommended. 2. No gross hilar adenopathy, noting limited sensitivity for the detection of hilar adenopathy on this noncontrast study. 3. Cholelithiasis. 4.  Emphysema (ICD10-J43.9). 5. At least left anterior descending coronary artery calcifications.   08/27/2021 Imaging   IMPRESSION: Rectal adenocarcinoma T stage: T4 B   Rectal adenocarcinoma N stage: N2 disease likely associated with early extra mesorectal lymph node involvement.   Distance from tumor to the internal anal sphincter is 1.2 cm.   Also with extramural venous involvement as described.    08/29/2021 Cancer Staging   Staging form: Colon and Rectum, AJCC 8th Edition - Clinical stage from 08/29/2021: Jorge [name], cM1 - Signed by Lanny Callander, MD on 08/29/2021 Stage prefix: Initial diagnosis   01/23/2022 Imaging   CT CAP w contrast IMPRESSION: 1. Mild interval decrease in size of multiple right-sided pulmonary nodules. No new suspicious pulmonary nodule or mass. 2. Interval development of approximately 10 new ill-defined hypoattenuating liver lesions ranging in size from approximately 5-10 mm. Imaging features suspicious for metastatic disease. MRI abdomen with and without contrast may prove helpful to further evaluate. 3. Similar appearance of wall thickening in the rectum with perirectal edema. 4. Cholelithiasis. 5. Prostatomegaly. 6. Aortic Atherosclerosis (ICD10-I70.0).   11/21/2022 Imaging    IMPRESSION: 1. Stable exam. No new or progressive interval findings. 2. The primary rectosigmoid lesion described previously is not well seen on the current study. There is presacral and perirectal edema, likely treatment related. 3. No substantial change in appearance of bilateral pulmonary metastases. 4. Stable tiny hypodensities in both hepatic lobes. These were previously characterized as metastatic disease. No new liver lesions on today's exam. 5. Cholelithiasis. 6. Emphysema (ICD10-J43.9) and Aortic Atherosclerosis (ICD10-170.0)     02/25/2024 Imaging   CT Chest, Abdomen, and Pelvis with contrast  IMPRESSION: Multiple lung nodules are again identified. These are increased in size from previous.   Previous nodes however in the mediastinum and right lung hilum are improving.   Fatty infiltration with multiple low-attenuation liver lesions. Many these are slightly larger but more low in density today. Dilated gallbladder with stones.   Persistent wall thickening and nodularity along the rectum with adjacent stranding and fluid. Please  correlate with exact location of neoplasm.    Overall mixed response.       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 These are increased in size from previous.  Previous nodes however in the mediastinum and right lung hilum are improving.   Fatty infiltration with multiple low-attenuation liver lesions. Many these are slightly larger but more low in density today.   Dilated gallbladder with stones. Persistent wall thickening and nodularity along the rectum with adjacent stranding and fluid. Please correlate with exact location of neoplasm.   Overall mixed response.       07/15/2024 -  Chemotherapy   Patient is on Treatment Plan : ANAL Nivolumab  (480) q28d        Discussed the use of AI scribe software for clinical note transcription with the patient, who gave verbal consent to proceed.  History of Present Illness Jorge [name] is a 73 year old male with metastatic colon cancer who presents for follow-up.  He experiences worsening diarrhea every three days, with the third day being particularly severe. The diarrhea varies between loose and hard, especially after consuming milk, and typically occurs once a day. He has noted recent weight loss, evidenced by a reduction in clothing size. He is on a regimen of oral togazepine and intravenous nivolumab  for metastatic colon cancer, taking the oral  medication at 7 AM, one hour before breakfast, without issues.     All other systems were reviewed with the patient and are negative.  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    SURGICAL HISTORY: Past Surgical History:  Procedure Laterality Date   BRONCHIAL BIOPSY  09/27/2021   Procedure: BRONCHIAL BIOPSIES;  Surgeon: Brenna Adine CROME, DO;  Location: [facility];  Service: Pulmonary;;   BRONCHIAL BRUSHINGS  09/27/2021   Procedure: BRONCHIAL BRUSHINGS;  Surgeon: Brenna Adine CROME, DO;  Location: [facility];  Service: Pulmonary;;   BRONCHIAL NEEDLE ASPIRATION BIOPSY  09/27/2021   Procedure: BRONCHIAL NEEDLE ASPIRATION BIOPSIES;  Surgeon: Brenna Adine CROME, DO;  Location: [facility];  Service: Pulmonary;;   COLONOSCOPY     IR IMAGING GUIDED PORT INSERTION  10/24/2021   left breast surgery Left 1968   VIDEO BRONCHOSCOPY WITH RADIAL ENDOBRONCHIAL ULTRASOUND  09/27/2021   Procedure: RADIAL ENDOBRONCHIAL ULTRASOUND;  Surgeon: Brenna Adine CROME, DO;  Location: [facility];  Service: Pulmonary;;    I have reviewed the social history and family history with the patient and they are unchanged from previous note.  ALLERGIES:  has no known allergies.  MEDICATIONS:  Current Outpatient Medications  Medication Sig Dispense Refill   amLODipine  (NORVASC ) 10 MG tablet Take 1 tablet (10 mg total) by mouth daily. 90 tablet 1   cabozantinib  (CABOMETYX ) 40 MG tablet Take 1  tablet (40 mg total) by mouth daily. Take on an empty stomach, 1 hour before or 2 hours after meals. 30 tablet 0   diphenoxylate -atropine  (LOMOTIL ) 2.5-0.025 MG tablet TAKE 1-2 TABLETS BY MOUTH 4 TIMES DAILY AS NEEDED FOR DIARRHEA OR LOOSE STOOLS. (Patient not taking: Reported on 07/10/2024) 120 tablet 0   gabapentin  (NEURONTIN ) 100 MG capsule TAKE 2 CAPSULES BY MOUTH 3 TIMES DAILY. 180 capsule 2   Multiple Vitamin  (MULTIVITAMIN WITH MINERALS) TABS tablet Take 1 tablet by mouth daily.     oxyCODONE  (OXY IR/ROXICODONE ) 5 MG immediate release tablet Take 1 tablet (5 mg total) by mouth every 6 (six) hours as needed for severe pain (pain score 7-10) or breakthrough pain. 60 tablet 0   No current facility-administered medications for this visit.   Facility-Administered Medications Ordered in Other Visits  Medication Dose Route Frequency Provider Last Rate Last Admin   0.9 %  sodium chloride  infusion   Intravenous Continuous Lanny Callander, MD   Stopped at 07/15/24 1632   sodium chloride  flush (NS) 0.9 % injection 10 mL  10 mL Intracatheter PRN Lanny Callander, MD   10 mL at 07/15/24 1632    PHYSICAL EXAMINATION: ECOG PERFORMANCE STATUS: 2 - Symptomatic, <50% confined to bed  Vitals:   07/15/24 1400 07/15/24 1446  BP: (!) 160/92 (!) 150/90  Pulse: 87   Resp: 19   Temp: 98.6 F (37 C)   SpO2: 99%    Wt Readings from Last 3 Encounters:  07/15/24 135 lb 9.6 oz (61.5 kg)  07/08/24 139 lb 6.4 oz (63.2 kg)  06/03/24 140 lb 14.4 oz (63.9 kg)     GENERAL:alert, no distress and comfortable SKIN: skin color, texture, turgor are normal, no rashes or significant lesions EYES: normal, Conjunctiva are pink and non-injected, sclera clear NECK: supple, thyroid normal size, non-tender, without nodularity LYMPH:  no palpable lymphadenopathy in the cervical, axillary  LUNGS: clear to auscultation and percussion with normal breathing effort HEART: regular rate & rhythm and no murmurs and no lower extremity edema ABDOMEN:abdomen soft, non-tender and normal bowel sounds Musculoskeletal:no cyanosis of digits and no clubbing  NEURO: alert & oriented x 3 with fluent speech, no focal motor/sensory deficits  Physical Exam    LABORATORY DATA:  I have reviewed the data as listed    Latest Ref Rng & Units 07/15/2024    2:20 PM 07/01/2024    8:22 AM 06/03/2024    9:32 AM  CBC  WBC 4.0 - 10.5 K/uL 4.0  4.5  4.5   Hemoglobin  13.0 - 17.0 g/dL 85.9  83.5  84.2   Hematocrit 39.0 - 52.0 % 43.0  49.8  48.0   Platelets 150 - 400 K/uL 148  201  143         Latest Ref Rng & Units 07/15/2024    2:20 PM 07/01/2024    8:22 AM 06/03/2024    9:32 AM  CMP  Glucose 70 - 99 mg/dL 886  894  891   BUN 8 - 23 mg/dL 7  7  7    Creatinine 0.61 - 1.24 mg/dL 9.33  9.23  9.27   Sodium 135 - 145 mmol/L 138  134  138   Potassium 3.5 - 5.1 mmol/L 3.5  3.8  3.3   Chloride 98 - 111 mmol/L 103  100  103   CO2 22 - 32 mmol/L 33  30  32   Calcium  8.9 - 10.3 mg/dL 8.5  8.4  8.3  Total Protein 6.5 - 8.1 g/dL 6.7  6.7  6.7   Total Bilirubin 0.0 - 1.2 mg/dL 0.3  0.5  0.5   Alkaline Phos 38 - 126 U/L 139  121  115   AST 15 - 41 U/L 25  40  34   ALT 0 - 44 U/L 20  25  18        RADIOGRAPHIC STUDIES: I have personally reviewed the radiological images as listed and agreed with the findings in the report. No results found.    No orders of the defined types were placed in this encounter.  All questions were answered. The patient knows to call the clinic with any problems, questions or concerns. No barriers to learning was detected. The total time spent in the appointment was 25 minutes, including review of chart and various tests results, discussions about plan of care and coordination of care plan     Onita Mattock, MD 07/15/2024

## 2024-07-16 ENCOUNTER — Telehealth: Payer: Self-pay

## 2024-07-16 ENCOUNTER — Other Ambulatory Visit: Payer: Self-pay | Admitting: Nurse Practitioner

## 2024-07-16 DIAGNOSIS — G893 Neoplasm related pain (acute) (chronic): Secondary | ICD-10-CM

## 2024-07-16 DIAGNOSIS — C7800 Secondary malignant neoplasm of unspecified lung: Secondary | ICD-10-CM

## 2024-07-16 DIAGNOSIS — Z515 Encounter for palliative care: Secondary | ICD-10-CM

## 2024-07-16 LAB — TSH: TSH: 3.53 u[IU]/mL (ref 0.350–4.500)

## 2024-07-16 LAB — T4: T4, Total: 5.9 ug/dL (ref 4.5–12.0)

## 2024-07-16 LAB — CEA (ACCESS): CEA (CHCC): 464.19 ng/mL — ABNORMAL HIGH (ref 0.00–5.00)

## 2024-07-16 MED ORDER — OXYCODONE HCL 5 MG PO TABS: 5.0000 mg | ORAL_TABLET | Freq: Four times a day (QID) | ORAL | 0 refills | Status: DC | PRN

## 2024-07-16 NOTE — Telephone Encounter (Signed)
-----   Message from Nurse Almarie A sent at 07/15/2024  4:35 PM EDT ----- Regarding: First time First time Opdivo - not first tx plan. Tolerated well. Dr. Lanny

## 2024-07-29 ENCOUNTER — Other Ambulatory Visit: Payer: Self-pay | Admitting: Nurse Practitioner

## 2024-07-29 DIAGNOSIS — Z515 Encounter for palliative care: Secondary | ICD-10-CM

## 2024-07-29 DIAGNOSIS — C7800 Secondary malignant neoplasm of unspecified lung: Secondary | ICD-10-CM

## 2024-07-29 DIAGNOSIS — G893 Neoplasm related pain (acute) (chronic): Secondary | ICD-10-CM

## 2024-07-29 MED ORDER — OXYCODONE HCL 5 MG PO TABS: 5.0000 mg | ORAL_TABLET | Freq: Four times a day (QID) | ORAL | 0 refills | Status: DC | PRN

## 2024-07-30 ENCOUNTER — Other Ambulatory Visit: Payer: Self-pay

## 2024-07-31 ENCOUNTER — Ambulatory Visit: Admitting: Hematology

## 2024-07-31 ENCOUNTER — Other Ambulatory Visit

## 2024-08-01 ENCOUNTER — Encounter: Payer: Self-pay | Admitting: Hematology

## 2024-08-01 ENCOUNTER — Inpatient Hospital Stay (HOSPITAL_BASED_OUTPATIENT_CLINIC_OR_DEPARTMENT_OTHER): Admitting: Hematology

## 2024-08-01 ENCOUNTER — Other Ambulatory Visit: Payer: Self-pay

## 2024-08-01 ENCOUNTER — Inpatient Hospital Stay

## 2024-08-01 VITALS — BP 128/70 | HR 44 | Temp 97.9°F | Resp 17 | Ht 72.0 in | Wt 140.3 lb

## 2024-08-01 DIAGNOSIS — C20 Malignant neoplasm of rectum: Secondary | ICD-10-CM

## 2024-08-01 DIAGNOSIS — Z5112 Encounter for antineoplastic immunotherapy: Secondary | ICD-10-CM | POA: Diagnosis not present

## 2024-08-01 DIAGNOSIS — C787 Secondary malignant neoplasm of liver and intrahepatic bile duct: Secondary | ICD-10-CM | POA: Diagnosis not present

## 2024-08-01 DIAGNOSIS — Z79620 Long term (current) use of immunosuppressive biologic: Secondary | ICD-10-CM | POA: Diagnosis not present

## 2024-08-01 DIAGNOSIS — C7801 Secondary malignant neoplasm of right lung: Secondary | ICD-10-CM | POA: Diagnosis not present

## 2024-08-01 LAB — CBC WITH DIFFERENTIAL (CANCER CENTER ONLY)
Abs Immature Granulocytes: 0.01 K/uL (ref 0.00–0.07)
Basophils Absolute: 0 K/uL (ref 0.0–0.1)
Basophils Relative: 1 %
Eosinophils Absolute: 0.1 K/uL (ref 0.0–0.5)
Eosinophils Relative: 2 %
HCT: 46.2 % (ref 39.0–52.0)
Hemoglobin: 15.1 g/dL (ref 13.0–17.0)
Immature Granulocytes: 0 %
Lymphocytes Relative: 22 %
Lymphs Abs: 0.9 K/uL (ref 0.7–4.0)
MCH: 29.2 pg (ref 26.0–34.0)
MCHC: 32.7 g/dL (ref 30.0–36.0)
MCV: 89.2 fL (ref 80.0–100.0)
Monocytes Absolute: 0.5 K/uL (ref 0.1–1.0)
Monocytes Relative: 12 %
Neutro Abs: 2.7 K/uL (ref 1.7–7.7)
Neutrophils Relative %: 63 %
Platelet Count: 166 K/uL (ref 150–400)
RBC: 5.18 MIL/uL (ref 4.22–5.81)
RDW: 14.1 % (ref 11.5–15.5)
WBC Count: 4.3 K/uL (ref 4.0–10.5)
nRBC: 0 % (ref 0.0–0.2)

## 2024-08-01 LAB — CMP (CANCER CENTER ONLY)
ALT: 31 U/L (ref 0–44)
AST: 39 U/L (ref 15–41)
Albumin: 2.7 g/dL — ABNORMAL LOW (ref 3.5–5.0)
Alkaline Phosphatase: 136 U/L — ABNORMAL HIGH (ref 38–126)
Anion gap: 3 — ABNORMAL LOW (ref 5–15)
BUN: 8 mg/dL (ref 8–23)
CO2: 31 mmol/L (ref 22–32)
Calcium: 7.9 mg/dL — ABNORMAL LOW (ref 8.9–10.3)
Chloride: 101 mmol/L (ref 98–111)
Creatinine: 0.82 mg/dL (ref 0.61–1.24)
GFR, Estimated: >60 mL/min (ref >60)
Glucose, Bld: 95 mg/dL (ref 70–99)
Potassium: 3.9 mmol/L (ref 3.5–5.1)
Sodium: 135 mmol/L (ref 135–145)
Total Bilirubin: 0.4 mg/dL (ref 0.0–1.2)
Total Protein: 6.5 g/dL (ref 6.5–8.1)

## 2024-08-01 MED ORDER — CABOZANTINIB S-MALATE 40 MG PO TABS
40.0000 mg | ORAL_TABLET | Freq: Every day | ORAL | 1 refills | Status: DC
  Filled 2024-08-01 (×2): qty 30, 30d supply, fill #0
  Filled 2024-09-03: qty 30, 30d supply, fill #1

## 2024-08-01 NOTE — Assessment & Plan Note (Signed)
 [id number] with lung metastasis, MMR normal, KRAS(+) [id number]  -diagnosed 08/2021 by colonoscopy for rectal bleeding and frequent BM. Staging MRI showed early extra mesorectal lymph node involvement.  -baseline CEA 571.84 on 09/06/21.  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 Xeloda  dose reduced due to elevated liver enzymes.  -due to new liver lesions, we switched to second-line FOLFIRI, with continued beva, on 02/21/22. He tolerates well overall.  - restaging CT scan on April 22 and June 26 showed stable disease overall. -CT 10/09/2023 showed mild disease progression in lungs, his tumor marker CEA has been going up also.  -I changed his chemo to lonsurf  and beva, he started on 12/13/2023, unfortunately progressed on CT 02/25/2024 -treatment changed to Stivarga . He required drug assistance  -restaging CT 07/01/2024 showed disease progression  -will change tx to Nivo and cabozantinib , he started on 07/15/24

## 2024-08-01 NOTE — Progress Notes (Signed)
 [facility]   Telephone:(336) [phone] Fax:(336) [phone]   Clinic Follow up Note   Patient Care Team: Petrina Pries, NP as PCP - General (Family Medicine) Croitoru, Jerel, MD as PCP - Cardiology (Cardiology) Pickenpack-Cousar, Fannie SAILOR, NP as Nurse Practitioner (Nurse Practitioner) Lanny Callander, MD as Consulting Physician (Hematology)  Date of Service:  08/01/2024  CHIEF COMPLAINT: f/u of metastatic rectal cancer  CURRENT THERAPY:  Fifth line therapy with nivolumab  every 4 weeks and cabozantinib  40 mg daily  Oncology History   Rectal cancer (HCC) [phone] with lung metastasis, MMR normal, KRAS(+) [id number]  -diagnosed 08/2021 by colonoscopy for rectal bleeding and frequent BM. Staging MRI showed early extra mesorectal lymph node involvement.  -baseline CEA 571.84 on 09/06/21.  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 Xeloda  dose reduced due to elevated liver enzymes.  -due to new liver lesions, we switched to second-line FOLFIRI, with continued beva, on 02/21/22. He tolerates well overall.  - restaging CT scan on April 22 and June 26 showed stable disease overall. -CT 10/09/2023 showed mild disease progression in lungs, his tumor marker CEA has been going up also.  -I changed his chemo to lonsurf  and beva, he started on 12/13/2023, unfortunately progressed on CT 02/25/2024 -treatment changed to Stivarga . 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 No gastrointestinal symptoms. Blood counts, kidney, and liver function tests are normal. No issues with recent infusion therapy. - Continue cabozantinib   daily - Schedule next infusion on August 14, 2024 - Ensure prescription for cabozantinib  is refilled before depletion  Chronic cancer-related pain Pain improved with gabapentin  and oxycodone . Pain in feet has improved significantly with current regimen. - Continue current pain management regimen with gabapentin  and oxycodone   Chemotherapy-induced alopecia Experiencing hair loss, likely due to cabozantinib . No concern regarding hair color change or hair loss.  Plan - He is tolerating cabozantinib  40 mg daily well, will continue, refills called in today - Will return in 2 weeks for cycle 2 nivolumab    SUMMARY OF ONCOLOGIC HISTORY: Oncology History Overview Note  Cancer Staging Rectal cancer [facility]) Staging form: Colon and Rectum, AJCC 8th Edition - Clinical stage from 08/29/2021: [name], cM1 - Signed by Lanny Callander, MD on 08/29/2021    Rectal cancer (HCC)  07/23/2021 Imaging   CT AP  IMPRESSION: 1. Appearance of the rectum likely indicates a rectal mass suspicious for rectal carcinoma. Inflammatory process would be less likely. Direct visualization is suggested. 2. Cholelithiasis without evidence of acute cholecystitis. 3. Aortic atherosclerosis.   08/19/2021 Procedure   Colonoscopy, under Dr. Wilhelmenia  Impression: - Rectal tenderness, palpable rectal mass and hemorrhoids found on digital rectal exam. - Stool in the entire examined colon - lavaged with still inadequate clearance. - One 35 mm polyp at the hepatic flexure. Biopsied. Tattooed distal in case future endoscopic resection is considered - however, has larger issues with rectal mass currently as below. - Four, 5 to 11 mm polyps in the transverse colon and at the hepatic flexure, removed with a cold snare. Resected and retrieved. - Diverticulosis in the recto-sigmoid colon and in the sigmoid colon. - Rule out malignancy, partially obstructing tumor in the rectum and from 6  to 13 cm proximal to the anus. Biopsied.    08/19/2021 Pathology Results   Diagnosis 1. Hepatic Flexure Biopsy - TUBULAR ADENOMA WITHOUT HIGH-GRADE DYSPLASIA OR MALIGNANCY 2. Transverse Colon Biopsy, and hepatic flexure, polyps (4) - TUBULAR ADENOMA WITHOUT HIGH-GRADE DYSPLASIA OR MALIGNANCY - OTHER FRAGMENTS OF POLYPOID COLONIC MUCOSA WITH NO SPECIFIC HISTOPATHOLOGIC CHANGES - FOOD MATERIAL 3. Rectum, biopsy - ADENOCARCINOMA. SEE NOTE   08/22/2021 Initial Diagnosis   Rectal cancer (HCC)   08/26/2021 Imaging   IMPRESSION: 1. A 2.2 x 1.9 cm right middle lobe pulmonary nodule as well as a total of four right upper lobe pulmonary nodule and masses measuring up to 3.6 cm. Findings concerning for metastatic primary lung cancer versus less likely metastases in a patient with rectal cancer. Additional imaging evaluation or consultation with Pulmonology or Thoracic Surgery recommended. 2. No gross hilar adenopathy, noting limited sensitivity for the detection of hilar adenopathy on this noncontrast study. 3. Cholelithiasis. 4.  Emphysema (ICD10-J43.9). 5. At least left anterior descending coronary artery calcifications.   08/27/2021 Imaging   IMPRESSION: Rectal adenocarcinoma T stage: T4 B   Rectal adenocarcinoma N stage: N2 disease likely associated with early extra mesorectal lymph node involvement.   Distance from tumor to the internal anal sphincter is 1.2 cm.   Also with extramural venous involvement as described.   08/29/2021 Cancer Staging   Staging form: Colon and Rectum, AJCC 8th Edition - Clinical stage from 08/29/2021: [name], cM1 - Signed by Lanny Callander, MD on 08/29/2021 Stage prefix: Initial diagnosis   01/23/2022 Imaging   CT CAP w contrast IMPRESSION: 1. Mild interval decrease in size of multiple right-sided pulmonary nodules. No new suspicious pulmonary nodule or mass. 2. Interval development of approximately 10 new ill-defined hypoattenuating liver lesions ranging in size from approximately 5-10 mm.  Imaging features suspicious for metastatic disease. MRI abdomen with and without contrast may prove helpful to further evaluate. 3. Similar appearance of wall thickening in the rectum with perirectal edema. 4. Cholelithiasis. 5. Prostatomegaly. 6. Aortic Atherosclerosis (ICD10-I70.0).   11/21/2022 Imaging    IMPRESSION: 1. Stable exam. No new or progressive interval findings. 2. The primary rectosigmoid lesion described previously is not well seen on the current study. There is presacral and perirectal edema, likely treatment related. 3. No substantial change in appearance of bilateral pulmonary metastases. 4. Stable tiny hypodensities in both hepatic lobes. These were previously characterized as metastatic disease. No new liver lesions on today's exam. 5. Cholelithiasis. 6. Emphysema (ICD10-J43.9) and Aortic Atherosclerosis (ICD10-170.0)     02/25/2024 Imaging   CT Chest, Abdomen, and Pelvis with contrast  IMPRESSION: Multiple lung nodules are again identified. These are increased in size from previous.   Previous nodes however in the mediastinum and right lung hilum are improving.   Fatty infiltration with multiple low-attenuation liver lesions. Many these are slightly larger but more low in density today. Dilated gallbladder with stones.   Persistent wall thickening and nodularity along the rectum with adjacent stranding and fluid. Please correlate with exact location of neoplasm.   Overall mixed response.       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)   10/14/2021 - 01/25/2022 Chemotherapy   Patient is on Treatment Plan : COLORECTAL CapeOx + Bevacizumab  q21d     02/21/2022 - 06/29/2022 Chemotherapy   Patient is on Treatment Plan : COLORECTAL FOLFIRI / BEVACIZUMAB  Q14D     02/21/2022 - 11/16/2023 Chemotherapy  Patient is on Treatment Plan : COLORECTAL FOLFIRI + Bevacizumab  q14d     12/13/2023  - 01/31/2024 Chemotherapy   Patient is on Treatment Plan : COLORECTAL Bevacizumab  + Trifluridine /Tipiracil  q28d     02/25/2024 Imaging   CT CAP with contrast  IMPRESSION: Multiple lung nodules are again identified. These are increased in size from previous.  Previous nodes however in the mediastinum and right lung hilum are improving.   Fatty infiltration with multiple low-attenuation liver lesions. Many these are slightly larger but more low in density today.   Dilated gallbladder with stones. Persistent wall thickening and nodularity along the rectum with adjacent stranding and fluid. Please correlate with exact location of neoplasm.   Overall mixed response.       07/15/2024 -  Chemotherapy   Patient is on Treatment Plan : ANAL Nivolumab  (480) q28d        Discussed the use of AI scribe software for clinical note transcription with the patient, who gave verbal consent to proceed.  History of Present Illness Jorge [name] is a 73 year old male with metastatic colon cancer who presents for follow-up.  He started cabozantinib  three weeks ago and experiences hair loss, particularly at the back of his head. He has no significant gastrointestinal issues such as diarrhea or stomach pain. He takes the medication at 6:00 AM on an empty stomach and waits until 8:00 AM to eat, with no issues reported.  He received an infusion during his last visit without any problems. He experiences some shortness of breath while walking but has no cough or chest discomfort.  He is on gabapentin  and oxycodone  for pain management, which has significantly improved his foot pain. He takes one oxycodone  with gabapentin , and this combination is effective.     All other systems were reviewed with the patient and are negative.  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    SURGICAL HISTORY: Past Surgical  History:  Procedure Laterality Date   BRONCHIAL BIOPSY  09/27/2021   Procedure: BRONCHIAL BIOPSIES;  Surgeon: Brenna Adine CROME, DO;  Location: [facility];  Service: Pulmonary;;   BRONCHIAL BRUSHINGS  09/27/2021   Procedure: BRONCHIAL BRUSHINGS;  Surgeon: Brenna Adine CROME, DO;  Location: [facility];  Service: Pulmonary;;   BRONCHIAL NEEDLE ASPIRATION BIOPSY  09/27/2021   Procedure: BRONCHIAL NEEDLE ASPIRATION BIOPSIES;  Surgeon: Brenna Adine CROME, DO;  Location: [facility];  Service: Pulmonary;;   COLONOSCOPY     IR IMAGING GUIDED PORT INSERTION  10/24/2021   left breast surgery Left 1968   VIDEO BRONCHOSCOPY WITH RADIAL ENDOBRONCHIAL ULTRASOUND  09/27/2021   Procedure: RADIAL ENDOBRONCHIAL ULTRASOUND;  Surgeon: Brenna Adine CROME, DO;  Location: [facility];  Service: Pulmonary;;    I have reviewed the social history and family history with the patient and they are unchanged from previous note.  ALLERGIES:  has no known allergies.  MEDICATIONS:  Current Outpatient Medications  Medication Sig Dispense Refill   amLODipine  (NORVASC ) 10 MG tablet Take 1 tablet (10 mg total) by mouth daily. 90 tablet 1   cabozantinib  (CABOMETYX ) 40 MG tablet Take 1 tablet (40 mg total) by mouth daily. Take on an empty stomach, 1 hour before or 2 hours after meals. 30 tablet 1   diphenoxylate -atropine  (LOMOTIL ) 2.5-0.025 MG tablet TAKE 1-2 TABLETS BY MOUTH 4 TIMES DAILY AS NEEDED FOR DIARRHEA OR LOOSE STOOLS. (Patient not taking: Reported on 07/10/2024)  120 tablet 0   gabapentin  (NEURONTIN ) 100 MG capsule TAKE 2 CAPSULES BY MOUTH 3 TIMES DAILY. 180 capsule 2   Multiple Vitamin (MULTIVITAMIN WITH MINERALS) TABS tablet Take 1 tablet by mouth daily.     oxyCODONE  (OXY IR/ROXICODONE ) 5 MG immediate release tablet Take 1 tablet (5 mg total) by mouth every 6 (six) hours as needed for severe pain (pain score 7-10) or breakthrough pain. 60 tablet 0   No current facility-administered medications for this visit.     PHYSICAL EXAMINATION: ECOG PERFORMANCE STATUS: 1 - Symptomatic but completely ambulatory  Vitals:   08/01/24 0828  BP: 128/70  Pulse: (!) 44  Resp: 17  Temp: 97.9 F (36.6 C)  SpO2: 98%   Wt Readings from Last 3 Encounters:  08/01/24 140 lb 4.8 oz (63.6 kg)  07/15/24 135 lb 9.6 oz (61.5 kg)  07/08/24 139 lb 6.4 oz (63.2 kg)     GENERAL:alert, no distress and comfortable SKIN: skin color, texture, turgor are normal, no rashes or significant lesions EYES: normal, Conjunctiva are pink and non-injected, sclera clear NECK: supple, thyroid normal size, non-tender, without nodularity LYMPH:  no palpable lymphadenopathy in the cervical, axillary  LUNGS: clear to auscultation and percussion with normal breathing effort HEART: regular rate & rhythm and no murmurs and no lower extremity edema ABDOMEN:abdomen soft, non-tender and normal bowel sounds Musculoskeletal:no cyanosis of digits and no clubbing  NEURO: alert & oriented x 3 with fluent speech, no focal motor/sensory deficits  Physical Exam    LABORATORY DATA:  I have reviewed the data as listed    Latest Ref Rng & Units 08/01/2024    7:54 AM 07/15/2024    2:20 PM 07/01/2024    8:22 AM  CBC  WBC 4.0 - 10.5 K/uL 4.3  4.0  4.5   Hemoglobin 13.0 - 17.0 g/dL 84.8  85.9  83.5   Hematocrit 39.0 - 52.0 % 46.2  43.0  49.8   Platelets 150 - 400 K/uL 166  148  201         Latest Ref Rng & Units 08/01/2024    7:54 AM 07/15/2024    2:20 PM 07/01/2024    8:22 AM  CMP  Glucose 70 - 99 mg/dL 95  886  894   BUN 8 - 23 mg/dL 8  7  7    Creatinine 0.61 - 1.24 mg/dL 9.17  9.33  9.23   Sodium 135 - 145 mmol/L 135  138  134   Potassium 3.5 - 5.1 mmol/L 3.9  3.5  3.8   Chloride 98 - 111 mmol/L 101  103  100   CO2 22 - 32 mmol/L 31  33  30   Calcium  8.9 - 10.3 mg/dL 7.9  8.5  8.4   Total Protein 6.5 - 8.1 g/dL 6.5  6.7  6.7   Total Bilirubin 0.0 - 1.2 mg/dL 0.4  0.3  0.5   Alkaline Phos 38 - 126 U/L 136  139  121   AST 15 - 41 U/L 39   25  40   ALT 0 - 44 U/L 31  20  25        RADIOGRAPHIC STUDIES: I have personally reviewed the radiological images as listed and agreed with the findings in the report. No results found.    No orders of the defined types were placed in this encounter.  All questions were answered. The patient knows to call the clinic with any problems, questions or concerns. No barriers to  learning was detected. The total time spent in the appointment was 25 minutes, including review of chart and various tests results, discussions about plan of care and coordination of care plan     Onita Mattock, MD 08/01/2024

## 2024-08-05 ENCOUNTER — Other Ambulatory Visit: Payer: Self-pay

## 2024-08-05 NOTE — Progress Notes (Signed)
 Specialty Pharmacy Ongoing Clinical Assessment Note  Jorge [name] is a 73 y.o. male who is being followed by the specialty pharmacy service for RxSp Oncology   Patient's specialty medication(s) reviewed today: Cabozantinib  S-Malate (CABOMETYX )   Missed doses in the last 4 weeks: 0   Patient/Caregiver did not have any additional questions or concerns.   Therapeutic benefit summary: Unable to assess   Adverse events/side effects summary: Experienced adverse events/side effects (Per daughter patient has had 1 instance of diarrhea beginning last night for which he has taken Immodium. She will montior him further today when he wakes up)   Patient's therapy is appropriate to: Continue    Goals Addressed             This Visit's Progress    Maintain optimal adherence to therapy       Patient is on track. Patient will maintain adherence         Follow up: 3 months  Lesleyann Fichter M Chyla Schlender Specialty Pharmacist

## 2024-08-05 NOTE — Progress Notes (Signed)
 Specialty Pharmacy Refill Coordination Note  Jorge [name] is a 73 y.o. male contacted today regarding refills of specialty medication(s) Cabozantinib  S-Malate (CABOMETYX )   Patient requested Delivery   Delivery date: 08/08/24   Verified address: [address]  [place] [address]   Medication will be filled on 08/07/24.

## 2024-08-06 ENCOUNTER — Other Ambulatory Visit: Payer: Self-pay

## 2024-08-06 ENCOUNTER — Inpatient Hospital Stay: Attending: Physician Assistant

## 2024-08-06 DIAGNOSIS — Z79620 Long term (current) use of immunosuppressive biologic: Secondary | ICD-10-CM | POA: Insufficient documentation

## 2024-08-06 DIAGNOSIS — C7801 Secondary malignant neoplasm of right lung: Secondary | ICD-10-CM | POA: Insufficient documentation

## 2024-08-06 DIAGNOSIS — C787 Secondary malignant neoplasm of liver and intrahepatic bile duct: Secondary | ICD-10-CM | POA: Diagnosis not present

## 2024-08-06 DIAGNOSIS — C7800 Secondary malignant neoplasm of unspecified lung: Secondary | ICD-10-CM

## 2024-08-06 DIAGNOSIS — C20 Malignant neoplasm of rectum: Secondary | ICD-10-CM | POA: Diagnosis not present

## 2024-08-06 DIAGNOSIS — Z5112 Encounter for antineoplastic immunotherapy: Secondary | ICD-10-CM | POA: Diagnosis present

## 2024-08-06 LAB — CMP (CANCER CENTER ONLY)
ALT: 31 U/L (ref 0–44)
AST: 46 U/L — ABNORMAL HIGH (ref 15–41)
Albumin: 2.8 g/dL — ABNORMAL LOW (ref 3.5–5.0)
Alkaline Phosphatase: 132 U/L — ABNORMAL HIGH (ref 38–126)
Anion gap: 3 — ABNORMAL LOW (ref 5–15)
BUN: 12 mg/dL (ref 8–23)
CO2: 31 mmol/L (ref 22–32)
Calcium: 8.2 mg/dL — ABNORMAL LOW (ref 8.9–10.3)
Chloride: 103 mmol/L (ref 98–111)
Creatinine: 0.8 mg/dL (ref 0.61–1.24)
GFR, Estimated: >60 mL/min (ref >60)
Glucose, Bld: 100 mg/dL — ABNORMAL HIGH (ref 70–99)
Potassium: 4 mmol/L (ref 3.5–5.1)
Sodium: 137 mmol/L (ref 135–145)
Total Bilirubin: 0.3 mg/dL (ref 0.0–1.2)
Total Protein: 6.7 g/dL (ref 6.5–8.1)

## 2024-08-06 LAB — CBC WITH DIFFERENTIAL (CANCER CENTER ONLY)
Abs Immature Granulocytes: 0 K/uL (ref 0.00–0.07)
Basophils Absolute: 0 K/uL (ref 0.0–0.1)
Basophils Relative: 1 %
Eosinophils Absolute: 0.2 K/uL (ref 0.0–0.5)
Eosinophils Relative: 6 %
HCT: 45.5 % (ref 39.0–52.0)
Hemoglobin: 15.3 g/dL (ref 13.0–17.0)
Immature Granulocytes: 0 %
Lymphocytes Relative: 38 %
Lymphs Abs: 1 K/uL (ref 0.7–4.0)
MCH: 29.6 pg (ref 26.0–34.0)
MCHC: 33.6 g/dL (ref 30.0–36.0)
MCV: 88 fL (ref 80.0–100.0)
Monocytes Absolute: 0.3 K/uL (ref 0.1–1.0)
Monocytes Relative: 11 %
Neutro Abs: 1.2 K/uL — ABNORMAL LOW (ref 1.7–7.7)
Neutrophils Relative %: 44 %
Platelet Count: 156 K/uL (ref 150–400)
RBC: 5.17 MIL/uL (ref 4.22–5.81)
RDW: 14.1 % (ref 11.5–15.5)
WBC Count: 2.7 K/uL — ABNORMAL LOW (ref 4.0–10.5)
nRBC: 0 % (ref 0.0–0.2)

## 2024-08-08 ENCOUNTER — Encounter: Payer: Self-pay | Admitting: Hematology

## 2024-08-14 ENCOUNTER — Inpatient Hospital Stay (HOSPITAL_BASED_OUTPATIENT_CLINIC_OR_DEPARTMENT_OTHER): Admitting: Nurse Practitioner

## 2024-08-14 ENCOUNTER — Inpatient Hospital Stay: Admitting: Hematology

## 2024-08-14 ENCOUNTER — Inpatient Hospital Stay

## 2024-08-14 ENCOUNTER — Encounter: Payer: Self-pay | Admitting: Nurse Practitioner

## 2024-08-14 VITALS — BP 130/80 | HR 61 | Temp 97.5°F | Resp 17 | Ht 72.0 in | Wt 138.8 lb

## 2024-08-14 DIAGNOSIS — M792 Neuralgia and neuritis, unspecified: Secondary | ICD-10-CM | POA: Diagnosis not present

## 2024-08-14 DIAGNOSIS — C7800 Secondary malignant neoplasm of unspecified lung: Secondary | ICD-10-CM

## 2024-08-14 DIAGNOSIS — Z5112 Encounter for antineoplastic immunotherapy: Secondary | ICD-10-CM | POA: Diagnosis not present

## 2024-08-14 DIAGNOSIS — C20 Malignant neoplasm of rectum: Secondary | ICD-10-CM

## 2024-08-14 DIAGNOSIS — R530 Neoplastic (malignant) related fatigue: Secondary | ICD-10-CM | POA: Diagnosis not present

## 2024-08-14 DIAGNOSIS — G893 Neoplasm related pain (acute) (chronic): Secondary | ICD-10-CM

## 2024-08-14 DIAGNOSIS — Z515 Encounter for palliative care: Secondary | ICD-10-CM

## 2024-08-14 LAB — CBC WITH DIFFERENTIAL (CANCER CENTER ONLY)
Abs Immature Granulocytes: 0 K/uL (ref 0.00–0.07)
Basophils Absolute: 0 K/uL (ref 0.0–0.1)
Basophils Relative: 1 %
Eosinophils Absolute: 0.1 K/uL (ref 0.0–0.5)
Eosinophils Relative: 4 %
HCT: 46 % (ref 39.0–52.0)
Hemoglobin: 15 g/dL (ref 13.0–17.0)
Immature Granulocytes: 0 %
Lymphocytes Relative: 35 %
Lymphs Abs: 1.2 K/uL (ref 0.7–4.0)
MCH: 28.8 pg (ref 26.0–34.0)
MCHC: 32.6 g/dL (ref 30.0–36.0)
MCV: 88.3 fL (ref 80.0–100.0)
Monocytes Absolute: 0.3 K/uL (ref 0.1–1.0)
Monocytes Relative: 9 %
Neutro Abs: 1.8 K/uL (ref 1.7–7.7)
Neutrophils Relative %: 51 %
Platelet Count: 172 K/uL (ref 150–400)
RBC: 5.21 MIL/uL (ref 4.22–5.81)
RDW: 14.8 % (ref 11.5–15.5)
WBC Count: 3.4 K/uL — ABNORMAL LOW (ref 4.0–10.5)
nRBC: 0 % (ref 0.0–0.2)

## 2024-08-14 LAB — CMP (CANCER CENTER ONLY)
ALT: 27 U/L (ref 0–44)
AST: 40 U/L (ref 15–41)
Albumin: 2.9 g/dL — ABNORMAL LOW (ref 3.5–5.0)
Alkaline Phosphatase: 115 U/L (ref 38–126)
Anion gap: 3 — ABNORMAL LOW (ref 5–15)
BUN: 8 mg/dL (ref 8–23)
CO2: 31 mmol/L (ref 22–32)
Calcium: 8.8 mg/dL — ABNORMAL LOW (ref 8.9–10.3)
Chloride: 103 mmol/L (ref 98–111)
Creatinine: 0.68 mg/dL (ref 0.61–1.24)
GFR, Estimated: >60 mL/min (ref >60)
Glucose, Bld: 105 mg/dL — ABNORMAL HIGH (ref 70–99)
Potassium: 4.1 mmol/L (ref 3.5–5.1)
Sodium: 137 mmol/L (ref 135–145)
Total Bilirubin: 0.4 mg/dL (ref 0.0–1.2)
Total Protein: 6.9 g/dL (ref 6.5–8.1)

## 2024-08-14 LAB — CEA (ACCESS): CEA (CHCC): 485.85 ng/mL — ABNORMAL HIGH (ref 0.00–5.00)

## 2024-08-14 MED ORDER — SODIUM CHLORIDE 0.9 % IV SOLN: INTRAVENOUS | Status: DC

## 2024-08-14 MED ORDER — OXYCODONE HCL 5 MG PO TABS: 5.0000 mg | ORAL_TABLET | Freq: Four times a day (QID) | ORAL | 0 refills | Status: DC | PRN

## 2024-08-14 MED ORDER — SODIUM CHLORIDE 0.9 % IV SOLN
480.0000 mg | Freq: Once | INTRAVENOUS | Status: AC
  Administered 2024-08-14: 480 mg via INTRAVENOUS
  Filled 2024-08-14: qty 48

## 2024-08-14 NOTE — Progress Notes (Signed)
 Palliative Medicine [facility]  Telephone:(336) [phone] Fax:(336) [phone]   Name: Jorge [name] Date: 08/14/2024 MRN: [id number]  DOB: June 10, 1951  Patient Care Team: Petrina Pries, NP as PCP - General (Family Medicine) Croitoru, Jerel, MD as PCP - Cardiology (Cardiology) Pickenpack-Cousar, Fannie SAILOR, NP as Nurse Practitioner (Nurse Practitioner) Lanny Callander, MD as Consulting Physician (Hematology)   INTERVAL HISTORY: Jorge [name] is a 73 y.o. 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  Palliative ask to see for symptom management and goals of care.   SOCIAL HISTORY:     reports that he quit smoking about 10 months ago. His smoking use included cigarettes. He has a 5 pack-year smoking history. He has never used smokeless tobacco. He reports that he does not currently use alcohol. He reports that he does not use drugs.  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  MEDICATIONS:  Current Outpatient Medications  Medication Sig Dispense Refill   amLODipine  (NORVASC ) 10 MG tablet Take 1 tablet (10 mg total) by mouth daily. 90 tablet 1   cabozantinib  (CABOMETYX ) 40 MG tablet Take 1 tablet (40 mg total) by mouth daily. Take on an empty stomach, 1 hour before or 2 hours after meals. 30 tablet 1   diphenoxylate -atropine  (LOMOTIL ) 2.5-0.025 MG tablet TAKE 1-2 TABLETS BY MOUTH 4 TIMES DAILY AS NEEDED FOR DIARRHEA OR LOOSE STOOLS. (Patient not taking: Reported on 07/10/2024) 120 tablet 0   gabapentin  (NEURONTIN ) 100 MG capsule TAKE 2 CAPSULES BY MOUTH 3 TIMES DAILY. 180 capsule 2   Multiple Vitamin (MULTIVITAMIN WITH MINERALS) TABS tablet Take 1 tablet by mouth daily.     oxyCODONE  (OXY IR/ROXICODONE ) 5 MG  immediate release tablet Take 1 tablet (5 mg total) by mouth every 6 (six) hours as needed for severe pain (pain score 7-10) or breakthrough pain. 60 tablet 0   No current facility-administered medications for this visit.   Facility-Administered Medications Ordered in Other Visits  Medication Dose Route Frequency Provider Last Rate Last Admin   0.9 %  sodium chloride  infusion   Intravenous Continuous Lanny Callander, MD   Stopped at 08/14/24 1356    VITAL SIGNS: There were no vitals taken for this visit. There were no vitals filed for this visit.   Estimated body mass index is 18.82 kg/m as calculated from the following:   Height as of an earlier encounter on 08/14/24: 6' (1.829 m).   Weight as of an earlier encounter on 08/14/24: 138 lb 12.8 oz (63 kg).  Assessment NAD RRR Normal breathing pattern AAO x3  PERFORMANCE STATUS (ECOG) : 1 - Symptomatic but completely ambulatory  IMPRESSION: Mr. Coryell presents to clinic today for symptom management follow-up.  No acute distress noted.  Continues to remain as active as possible. Occasional fatigue.  Reports his appetite fluctuates.  Some days are better than others but feels that it has much improved. Current weight is stable at 138lbs. Denies concerns with nausea, vomiting, or constipation.    We discussed his pain at length.  Mr. Celena reports pain is well-controlled on current regimen however does have some worsening neuropathic discomfort. His current regimen consist of gabapentin  200 mg 3 times daily and oxycodone  5 mg every 6 hours as  needed. Advised to increase gabapentin  to 300 mg 3 times a day.  He verbalized understanding.  Will continue to closely monitor.  All questions answered and support provided.   PLAN:  Continue gabapentin  to 300mg  three times daily.   Oxy IR 5mg  every 4-6 hours as needed for severe pain over the next 7 days. Will send in refill as appropriate. I will plan to see patient back in 4-6 weeks in collaboration  with his oncology appointments.   Patient expressed understanding and was in agreement with this plan. He also understands that He can call the clinic at any time with any questions, concerns, or complaints.     Any controlled substances utilized were prescribed in the context of palliative care. PDMP has been reviewed.    Visit consisted of counseling and education dealing with the complex and emotionally intense issues of symptom management and palliative care in the setting of serious and potentially life-threatening illness.  Levon Borer, AGPCNP-BC  Palliative Medicine Team/[facility]

## 2024-08-14 NOTE — Progress Notes (Signed)
[name], [name]   [phone] [phone]   Clinic Follow up Note   Patient Care Team: Petrina Pries, NP as PCP - General (Family Medicine) Croitoru, Jerel, MD as PCP - Cardiology (Cardiology) Pickenpack-Cousar, Fannie SAILOR, NP as Nurse Practitioner (Nurse Practitioner) Lanny Callander, MD as Consulting Physician (Hematology)  Date of Service:  08/14/2024  CHIEF COMPLAINT: f/u of metastatic rectal cancer  CURRENT THERAPY:  Nivolumab  every 4 weeks, cabozantinib  40 mg daily  Oncology History   Rectal cancer (HCC) [phone] with lung metastasis, MMR normal, KRAS(+) [id number]  -diagnosed 08/2021 by colonoscopy for rectal bleeding and frequent BM. Staging MRI showed early extra mesorectal lymph node involvement.  -baseline CEA 571.84 on 09/06/21.  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 Xeloda  dose reduced due to elevated liver enzymes.  -due to new liver lesions, we switched to second-line FOLFIRI, with continued beva, on 02/21/22. He tolerates well overall.  - restaging CT scan on April 22 and June 26 showed stable disease overall. -CT 10/09/2023 showed mild disease progression in lungs, his tumor marker CEA has been going up also.  -I changed his chemo to lonsurf  and beva, he started on 12/13/2023, unfortunately progressed on CT 02/25/2024 -treatment changed to Stivarga . He required drug assistance  -restaging CT 07/01/2024 showed disease progression  -will change tx to Nivo and cabozantinib , he started on 07/15/24   Assessment & Plan Metastatic colon cancer with hepatic involvement Undergoing treatment with cabozantinib . Liver function tests are normal. - Proceed with cabozantinib  treatment - Schedule follow-up scan after third dose of cabozantinib , likely before Thanksgiving  Chemotherapy-induced diarrhea Experiences  diarrhea managed with Lomotil  (diphenoxylate /atropine ). - Continue Lomotil  as needed for diarrhea  Plan - He is clinically stable, tolerating treatment well.  Will proceed nivolumab  today and continue every 4 weeks - He will continue cabozantinib  40 mg daily at home - Lab and follow-up in 4 weeks, plan to repeat CT scan in 5 to 6 weeks   SUMMARY OF ONCOLOGIC HISTORY: Oncology History Overview Note  Cancer Staging Rectal cancer [facility]) Staging form: Colon and Rectum, AJCC 8th Edition - Clinical stage from 08/29/2021: [name], cM1 - Signed by Lanny Callander, MD on 08/29/2021    Rectal cancer (HCC)  07/23/2021 Imaging   CT AP  IMPRESSION: 1. Appearance of the rectum likely indicates a rectal mass suspicious for rectal carcinoma. Inflammatory process would be less likely. Direct visualization is suggested. 2. Cholelithiasis without evidence of acute cholecystitis. 3. Aortic atherosclerosis.   08/19/2021 Procedure   Colonoscopy, under Dr. Wilhelmenia  Impression: - Rectal tenderness, palpable rectal mass and hemorrhoids found on digital rectal exam. - Stool in the entire examined colon - lavaged with still inadequate clearance. - One 35 mm polyp at the hepatic flexure. Biopsied. Tattooed distal in case future endoscopic resection is considered - however, has larger issues with rectal mass currently as below. - Four, 5 to 11 mm polyps in the transverse colon and at the hepatic flexure, removed with a cold snare. Resected and retrieved. - Diverticulosis in the recto-sigmoid colon and in the sigmoid colon. - Rule out malignancy, partially obstructing tumor in the rectum and from 6 to 13 cm proximal to the anus. Biopsied.   08/19/2021 Pathology Results   Diagnosis 1. Hepatic Flexure Biopsy - TUBULAR ADENOMA WITHOUT HIGH-GRADE DYSPLASIA OR MALIGNANCY 2. Transverse Colon Biopsy, and hepatic flexure, polyps (4) - TUBULAR ADENOMA  WITHOUT HIGH-GRADE DYSPLASIA OR MALIGNANCY - OTHER FRAGMENTS OF  POLYPOID COLONIC MUCOSA WITH NO SPECIFIC HISTOPATHOLOGIC CHANGES - FOOD MATERIAL 3. Rectum, biopsy - ADENOCARCINOMA. SEE NOTE   08/22/2021 Initial Diagnosis   Rectal cancer (HCC)   08/26/2021 Imaging   IMPRESSION: 1. A 2.2 x 1.9 cm right middle lobe pulmonary nodule as well as a total of four right upper lobe pulmonary nodule and masses measuring up to 3.6 cm. Findings concerning for metastatic primary lung cancer versus less likely metastases in a patient with rectal cancer. Additional imaging evaluation or consultation with Pulmonology or Thoracic Surgery recommended. 2. No gross hilar adenopathy, noting limited sensitivity for the detection of hilar adenopathy on this noncontrast study. 3. Cholelithiasis. 4.  Emphysema (ICD10-J43.9). 5. At least left anterior descending coronary artery calcifications.   08/27/2021 Imaging   IMPRESSION: Rectal adenocarcinoma T stage: T4 B   Rectal adenocarcinoma N stage: N2 disease likely associated with early extra mesorectal lymph node involvement.   Distance from tumor to the internal anal sphincter is 1.2 cm.   Also with extramural venous involvement as described.   08/29/2021 Cancer Staging   Staging form: Colon and Rectum, AJCC 8th Edition - Clinical stage from 08/29/2021: [name], cM1 - Signed by Lanny Callander, MD on 08/29/2021 Stage prefix: Initial diagnosis   01/23/2022 Imaging   CT CAP w contrast IMPRESSION: 1. Mild interval decrease in size of multiple right-sided pulmonary nodules. No new suspicious pulmonary nodule or mass. 2. Interval development of approximately 10 new ill-defined hypoattenuating liver lesions ranging in size from approximately 5-10 mm. Imaging features suspicious for metastatic disease. MRI abdomen with and without contrast may prove helpful to further evaluate. 3. Similar appearance of wall thickening in the rectum with perirectal edema. 4. Cholelithiasis. 5. Prostatomegaly. 6. Aortic Atherosclerosis  (ICD10-I70.0).   11/21/2022 Imaging    IMPRESSION: 1. Stable exam. No new or progressive interval findings. 2. The primary rectosigmoid lesion described previously is not well seen on the current study. There is presacral and perirectal edema, likely treatment related. 3. No substantial change in appearance of bilateral pulmonary metastases. 4. Stable tiny hypodensities in both hepatic lobes. These were previously characterized as metastatic disease. No new liver lesions on today's exam. 5. Cholelithiasis. 6. Emphysema (ICD10-J43.9) and Aortic Atherosclerosis (ICD10-170.0)     02/25/2024 Imaging   CT Chest, Abdomen, and Pelvis with contrast  IMPRESSION: Multiple lung nodules are again identified. These are increased in size from previous.   Previous nodes however in the mediastinum and right lung hilum are improving.   Fatty infiltration with multiple low-attenuation liver lesions. Many these are slightly larger but more low in density today. Dilated gallbladder with stones.   Persistent wall thickening and nodularity along the rectum with adjacent stranding and fluid. Please correlate with exact location of neoplasm.   Overall mixed response.       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 These are increased in size from previous.  Previous  nodes however in the mediastinum and right lung hilum are improving.   Fatty infiltration with multiple low-attenuation liver lesions. Many these are slightly larger but more low in density today.   Dilated gallbladder with stones. Persistent wall thickening and nodularity along the rectum with adjacent stranding and fluid. Please correlate with exact location of neoplasm.   Overall mixed response.       07/15/2024 -  Chemotherapy   Patient is on Treatment Plan : ANAL Nivolumab  (480) q28d        Discussed the use of AI scribe software for clinical note transcription with the patient, who gave verbal consent to proceed.  History of Present Illness [name] is a 73 year old male with metastatic colon cancer who presents for follow-up.  He is undergoing treatment with cabozantinib  and is also taking oxycodone , Lomotil , Neurontin , and amlodipine . Lomotil  is used as needed for diarrhea. He experienced a single episode of acholic stool, but bowel movements have since normalized. No current issues with diarrhea or muscle problems are present. His energy level is sufficient to care for his great-grandchildren. Recent liver function tests were normal.     All other systems were reviewed with the patient and are negative.  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    SURGICAL HISTORY: Past Surgical History:  Procedure Laterality Date   BRONCHIAL BIOPSY  09/27/2021   Procedure: BRONCHIAL BIOPSIES;  Surgeon: Brenna Adine CROME, DO;  Location: [facility];  Service: Pulmonary;;   BRONCHIAL BRUSHINGS  09/27/2021   Procedure: BRONCHIAL BRUSHINGS;  Surgeon: Brenna Adine CROME, DO;  Location: [facility];  Service: Pulmonary;;   BRONCHIAL NEEDLE ASPIRATION BIOPSY  09/27/2021   Procedure: BRONCHIAL NEEDLE ASPIRATION BIOPSIES;  Surgeon: Brenna Adine CROME, DO;  Location: [facility];  Service:  Pulmonary;;   COLONOSCOPY     IR IMAGING GUIDED PORT INSERTION  10/24/2021   left breast surgery Left 1968   VIDEO BRONCHOSCOPY WITH RADIAL ENDOBRONCHIAL ULTRASOUND  09/27/2021   Procedure: RADIAL ENDOBRONCHIAL ULTRASOUND;  Surgeon: Brenna Adine CROME, DO;  Location: [facility];  Service: Pulmonary;;    I have reviewed the social history and family history with the patient and they are unchanged from previous note.  ALLERGIES:  has no known allergies.  MEDICATIONS:  Current Outpatient Medications  Medication Sig Dispense Refill   amLODipine  (NORVASC ) 10 MG tablet Take 1 tablet (10 mg total) by mouth daily. 90 tablet 1   cabozantinib  (CABOMETYX ) 40 MG tablet Take 1 tablet (40 mg total) by mouth daily. Take on an empty stomach, 1 hour before or 2 hours after meals. 30 tablet 1   diphenoxylate -atropine  (LOMOTIL ) 2.5-0.025 MG tablet TAKE 1-2 TABLETS BY MOUTH 4 TIMES DAILY AS NEEDED FOR DIARRHEA OR LOOSE STOOLS. (Patient not taking: Reported on 07/10/2024) 120 tablet 0   gabapentin  (NEURONTIN ) 100 MG capsule TAKE 2 CAPSULES BY MOUTH 3 TIMES DAILY. 180 capsule 2   Multiple Vitamin (MULTIVITAMIN WITH MINERALS) TABS tablet Take 1 tablet by mouth daily.     oxyCODONE  (OXY IR/ROXICODONE ) 5 MG immediate release tablet Take 1 tablet (5 mg total) by mouth every 6 (six) hours as needed for severe pain (pain score 7-10) or breakthrough pain. 60 tablet 0   No current facility-administered medications for this visit.  Facility-Administered Medications Ordered in Other Visits  Medication Dose Route Frequency Provider Last Rate Last Admin   0.9 %  sodium chloride  infusion   Intravenous Continuous Lanny Callander, MD   Stopped at 08/14/24 1356    PHYSICAL EXAMINATION: ECOG PERFORMANCE STATUS: 1 - Symptomatic but completely ambulatory  Vitals:   08/14/24 1139  BP: 130/80  Pulse: 61  Resp: 17  Temp: (!) 97.5 F (36.4 C)  SpO2: 99%   Wt Readings from Last 3 Encounters:  08/14/24 138 lb 12.8 oz (63 kg)   08/01/24 140 lb 4.8 oz (63.6 kg)  07/15/24 135 lb 9.6 oz (61.5 kg)     GENERAL:alert, no distress and comfortable SKIN: skin color, texture, turgor are normal, no rashes or significant lesions EYES: normal, Conjunctiva are pink and non-injected, sclera clear Musculoskeletal:no cyanosis of digits and no clubbing  NEURO: alert & oriented x 3 with fluent speech, no focal motor/sensory deficits  Physical Exam    LABORATORY DATA:  I have reviewed the data as listed    Latest Ref Rng & Units 08/14/2024   11:11 AM 08/06/2024    8:07 AM 08/01/2024    7:54 AM  CBC  WBC 4.0 - 10.5 K/uL 3.4  2.7  4.3   Hemoglobin 13.0 - 17.0 g/dL 84.9  84.6  84.8   Hematocrit 39.0 - 52.0 % 46.0  45.5  46.2   Platelets 150 - 400 K/uL 172  156  166         Latest Ref Rng & Units 08/14/2024   11:11 AM 08/06/2024    8:07 AM 08/01/2024    7:54 AM  CMP  Glucose 70 - 99 mg/dL 894  899  95   BUN 8 - 23 mg/dL 8  12  8    Creatinine 0.61 - 1.24 mg/dL 9.31  9.19  9.17   Sodium 135 - 145 mmol/L 137  137  135   Potassium 3.5 - 5.1 mmol/L 4.1  4.0  3.9   Chloride 98 - 111 mmol/L 103  103  101   CO2 22 - 32 mmol/L 31  31  31    Calcium  8.9 - 10.3 mg/dL 8.8  8.2  7.9   Total Protein 6.5 - 8.1 g/dL 6.9  6.7  6.5   Total Bilirubin 0.0 - 1.2 mg/dL 0.4  0.3  0.4   Alkaline Phos 38 - 126 U/L 115  132  136   AST 15 - 41 U/L 40  46  39   ALT 0 - 44 U/L 27  31  31        RADIOGRAPHIC STUDIES: I have personally reviewed the radiological images as listed and agreed with the findings in the report. No results found.    Orders Placed This Encounter  Procedures   CBC with Differential ([name] Only)    Standing Status:   Future    Expected Date:   10/07/2024    Expiration Date:   10/07/2025   CMP ([name] only)    Standing Status:   Future    Expected Date:   10/07/2024    Expiration Date:   10/07/2025   All questions were answered. The patient knows to call the clinic with any problems, questions or  concerns. No barriers to learning was detected. The total time spent in the appointment was 25 minutes, including review of chart and various tests results, discussions about plan of care and coordination of care plan     Callander Lanny, MD  08/14/2024     

## 2024-08-14 NOTE — Patient Instructions (Signed)
 [facility] - A DEPT OF [facility]  Discharge Instructions: Thank you for choosing [facility] to provide your oncology and hematology care.   If you have a lab appointment with the Cancer Center, please go directly to the Cancer Center and check in at the registration area.   Wear comfortable clothing and clothing appropriate for easy access to any Portacath or PICC line.   We strive to give you quality time with your provider. You may need to reschedule your appointment if you arrive late (15 or more minutes).  Arriving late affects you and other patients whose appointments are after yours.  Also, if you miss three or more appointments without notifying the office, you may be dismissed from the clinic at the provider's discretion.      For prescription refill requests, have your pharmacy contact our office and allow 72 hours for refills to be completed.    Today you received the following chemotherapy and/or immunotherapy agents: Opdivo  (nivolumab )      To help prevent nausea and vomiting after your treatment, we encourage you to take your nausea medication as directed.  BELOW ARE SYMPTOMS THAT SHOULD BE REPORTED IMMEDIATELY: *FEVER GREATER THAN 100.4 F (38 C) OR HIGHER *CHILLS OR SWEATING *NAUSEA AND VOMITING THAT IS NOT CONTROLLED WITH YOUR NAUSEA MEDICATION *UNUSUAL SHORTNESS OF BREATH *UNUSUAL BRUISING OR BLEEDING *URINARY PROBLEMS (pain or burning when urinating, or frequent urination) *BOWEL PROBLEMS (unusual diarrhea, constipation, pain near the anus) TENDERNESS IN MOUTH AND THROAT WITH OR WITHOUT PRESENCE OF ULCERS (sore throat, sores in mouth, or a toothache) UNUSUAL RASH, SWELLING OR PAIN  UNUSUAL VAGINAL DISCHARGE OR ITCHING   Items with * indicate a potential emergency and should be followed up as soon as possible or go to the Emergency Department if any problems should occur.  Please show the CHEMOTHERAPY ALERT CARD or  IMMUNOTHERAPY ALERT CARD at check-in to the Emergency Department and triage nurse.  Should you have questions after your visit or need to cancel or reschedule your appointment, please contact [facility] - A DEPT OF [facility]  Dept: [phone]  and follow the prompts.  Office hours are 8:00 a.m. to 4:30 p.m. Monday - Friday. Please note that voicemails left after 4:00 p.m. may not be returned until the following business day.  We are closed weekends and major holidays. You have access to a nurse at all times for urgent questions. Please call the main number to the clinic Dept: [phone] and follow the prompts.   For any non-urgent questions, you may also contact your provider using MyChart. We now offer e-Visits for anyone 67 and older to request care online for non-urgent symptoms. For details visit [email or web address].   Also download the MyChart app! Go to the app store, search MyChart, open the app, select [facility], and log in with your MyChart username and password.  Nivolumab  Injection What is this medication? NIVOLUMAB  (nye VOL ue mab) treats some types of cancer. It works by helping your immune system slow or stop the spread of cancer cells. It is a monoclonal antibody. This medicine may be used for other purposes; ask your health care provider or pharmacist if you have questions. COMMON BRAND NAME(S): Opdivo  What should I tell my care team before I take this medication? They need to know if you have any of these conditions: Allogeneic stem cell transplant (uses someone else's stem cells)  Autoimmune diseases, such as Crohn disease, ulcerative colitis, lupus History of chest radiation Nervous system problems, such as Guillain-Barre syndrome or myasthenia gravis Organ transplant An unusual or allergic reaction to nivolumab , other medications, foods, dyes, or preservatives Pregnant or trying to get pregnant Breast-feeding How should I use  this medication? This medication is infused into a vein. It is given in a hospital or clinic setting. A special MedGuide will be given to you before each treatment. Be sure to read this information carefully each time. Talk to your care team about the use of this medication in children. While it may be prescribed for children as young as 12 years for selected conditions, precautions do apply. Overdosage: If you think you have taken too much of this medicine contact a poison control center or emergency room at once. NOTE: This medicine is only for you. Do not share this medicine with others. What if I miss a dose? Keep appointments for follow-up doses. It is important not to miss your dose. Call your care team if you are unable to keep an appointment. What may interact with this medication? Interactions have not been studied. This list may not describe all possible interactions. Give your health care provider a list of all the medicines, herbs, non-prescription drugs, or dietary supplements you use. Also tell them if you smoke, drink alcohol, or use illegal drugs. Some items may interact with your medicine. What should I watch for while using this medication? Your condition will be monitored carefully while you are receiving this medication. You may need blood work while taking this medication. This medication may cause serious skin reactions. They can happen weeks to months after starting the medication. Contact your care team right away if you notice fevers or flu-like symptoms with a rash. The rash may be red or purple and then turn into blisters or peeling of the skin. You may also notice a red rash with swelling of the face, lips, or lymph nodes in your neck or under your arms. Tell your care team right away if you have any change in your eyesight. Talk to your care team if you are pregnant or think you might be pregnant. A negative pregnancy test is required before starting this medication. A  reliable form of contraception is recommended while taking this medication and for 5 months after the last dose. Talk to your care team about effective forms of contraception. Do not breast-feed while taking this medication and for 5 months after the last dose. What side effects may I notice from receiving this medication? Side effects that you should report to your care team as soon as possible: Allergic reactions--skin rash, itching, hives, swelling of the face, lips, tongue, or throat Dry cough, shortness of breath or trouble breathing Eye pain, redness, irritation, or discharge with blurry or decreased vision Heart muscle inflammation--unusual weakness or fatigue, shortness of breath, chest pain, fast or irregular heartbeat, dizziness, swelling of the ankles, feet, or hands Hormone gland problems--headache, sensitivity to light, unusual weakness or fatigue, dizziness, fast or irregular heartbeat, increased sensitivity to cold or heat, excessive sweating, constipation, hair loss, increased thirst or amount of urine, tremors or shaking, irritability Infusion reactions--chest pain, shortness of breath or trouble breathing, feeling faint or lightheaded Kidney injury (glomerulonephritis)--decrease in the amount of urine, red or dark brown urine, foamy or bubbly urine, swelling of the ankles, hands, or feet Liver injury--right upper belly pain, loss of appetite, nausea, light-colored stool, dark yellow or brown urine, yellowing skin  or eyes, unusual weakness or fatigue Pain, tingling, or numbness in the hands or feet, muscle weakness, change in vision, confusion or trouble speaking, loss of balance or coordination, trouble walking, seizures Rash, fever, and swollen lymph nodes Redness, blistering, peeling, or loosening of the skin, including inside the mouth Sudden or severe stomach pain, bloody diarrhea, fever, nausea, vomiting Side effects that usually do not require medical attention (report these  to your care team if they continue or are bothersome): Bone, joint, or muscle pain Diarrhea Fatigue Loss of appetite Nausea Skin rash This list may not describe all possible side effects. Call your doctor for medical advice about side effects. You may report side effects to FDA at 1-800-FDA-1088. Where should I keep my medication? This medication is given in a hospital or clinic. It will not be stored at home. NOTE: This sheet is a summary. It may not cover all possible information. If you have questions about this medicine, talk to your doctor, pharmacist, or health care provider.  2024 Elsevier/Gold Standard (2022-02-20 00:00:00)

## 2024-08-14 NOTE — Addendum Note (Signed)
 Addended by: PICKENPACK-COUSAR, [place] Ndiaye N on: 08/14/2024 04:42 PM   Modules accepted: Orders

## 2024-08-14 NOTE — Assessment & Plan Note (Signed)
 [id number] with lung metastasis, MMR normal, KRAS(+) [id number]  -diagnosed 08/2021 by colonoscopy for rectal bleeding and frequent BM. Staging MRI showed early extra mesorectal lymph node involvement.  -baseline CEA 571.84 on 09/06/21.  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 Xeloda  dose reduced due to elevated liver enzymes.  -due to new liver lesions, we switched to second-line FOLFIRI, with continued beva, on 02/21/22. He tolerates well overall.  - restaging CT scan on April 22 and June 26 showed stable disease overall. -CT 10/09/2023 showed mild disease progression in lungs, his tumor marker CEA has been going up also.  -I changed his chemo to lonsurf  and beva, he started on 12/13/2023, unfortunately progressed on CT 02/25/2024 -treatment changed to Stivarga . He required drug assistance  -restaging CT 07/01/2024 showed disease progression  -will change tx to Nivo and cabozantinib , he started on 07/15/24

## 2024-09-01 ENCOUNTER — Other Ambulatory Visit: Payer: Self-pay

## 2024-09-01 DIAGNOSIS — Z515 Encounter for palliative care: Secondary | ICD-10-CM

## 2024-09-01 DIAGNOSIS — G893 Neoplasm related pain (acute) (chronic): Secondary | ICD-10-CM

## 2024-09-01 DIAGNOSIS — C7800 Secondary malignant neoplasm of unspecified lung: Secondary | ICD-10-CM

## 2024-09-01 MED ORDER — OXYCODONE HCL 5 MG PO TABS: 5.0000 mg | ORAL_TABLET | Freq: Four times a day (QID) | ORAL | 0 refills | Status: DC | PRN

## 2024-09-03 ENCOUNTER — Other Ambulatory Visit: Payer: Self-pay

## 2024-09-05 ENCOUNTER — Other Ambulatory Visit: Payer: Self-pay

## 2024-09-09 ENCOUNTER — Other Ambulatory Visit (HOSPITAL_COMMUNITY): Payer: Self-pay

## 2024-09-11 ENCOUNTER — Inpatient Hospital Stay: Attending: Physician Assistant

## 2024-09-11 ENCOUNTER — Other Ambulatory Visit: Payer: Self-pay

## 2024-09-11 ENCOUNTER — Other Ambulatory Visit (HOSPITAL_COMMUNITY): Payer: Self-pay

## 2024-09-11 ENCOUNTER — Inpatient Hospital Stay

## 2024-09-11 ENCOUNTER — Inpatient Hospital Stay (HOSPITAL_BASED_OUTPATIENT_CLINIC_OR_DEPARTMENT_OTHER): Admitting: Hematology

## 2024-09-11 ENCOUNTER — Other Ambulatory Visit (HOSPITAL_BASED_OUTPATIENT_CLINIC_OR_DEPARTMENT_OTHER): Payer: Self-pay

## 2024-09-11 ENCOUNTER — Encounter: Payer: Self-pay | Admitting: Hematology

## 2024-09-11 VITALS — BP 138/88 | HR 83 | Temp 97.3°F | Resp 19 | Ht 72.0 in | Wt 135.5 lb

## 2024-09-11 DIAGNOSIS — R197 Diarrhea, unspecified: Secondary | ICD-10-CM

## 2024-09-11 DIAGNOSIS — Z79620 Long term (current) use of immunosuppressive biologic: Secondary | ICD-10-CM | POA: Insufficient documentation

## 2024-09-11 DIAGNOSIS — Z5112 Encounter for antineoplastic immunotherapy: Secondary | ICD-10-CM | POA: Diagnosis present

## 2024-09-11 DIAGNOSIS — Z515 Encounter for palliative care: Secondary | ICD-10-CM

## 2024-09-11 DIAGNOSIS — C7800 Secondary malignant neoplasm of unspecified lung: Secondary | ICD-10-CM

## 2024-09-11 DIAGNOSIS — C20 Malignant neoplasm of rectum: Secondary | ICD-10-CM

## 2024-09-11 DIAGNOSIS — C7801 Secondary malignant neoplasm of right lung: Secondary | ICD-10-CM | POA: Diagnosis not present

## 2024-09-11 LAB — CMP (CANCER CENTER ONLY)
ALT: 44 U/L (ref 0–44)
AST: 51 U/L — ABNORMAL HIGH (ref 15–41)
Albumin: 3 g/dL — ABNORMAL LOW (ref 3.5–5.0)
Alkaline Phosphatase: 135 U/L — ABNORMAL HIGH (ref 38–126)
Anion gap: 4 — ABNORMAL LOW (ref 5–15)
BUN: 8 mg/dL (ref 8–23)
CO2: 32 mmol/L (ref 22–32)
Calcium: 8.5 mg/dL — ABNORMAL LOW (ref 8.9–10.3)
Chloride: 102 mmol/L (ref 98–111)
Creatinine: 0.72 mg/dL (ref 0.61–1.24)
GFR, Estimated: >60 mL/min (ref >60)
Glucose, Bld: 105 mg/dL — ABNORMAL HIGH (ref 70–99)
Potassium: 3.6 mmol/L (ref 3.5–5.1)
Sodium: 138 mmol/L (ref 135–145)
Total Bilirubin: 0.4 mg/dL (ref 0.0–1.2)
Total Protein: 6.8 g/dL (ref 6.5–8.1)

## 2024-09-11 LAB — CBC WITH DIFFERENTIAL (CANCER CENTER ONLY)
Abs Immature Granulocytes: 0.01 K/uL (ref 0.00–0.07)
Basophils Absolute: 0 K/uL (ref 0.0–0.1)
Basophils Relative: 1 %
Eosinophils Absolute: 0.1 K/uL (ref 0.0–0.5)
Eosinophils Relative: 3 %
HCT: 45.7 % (ref 39.0–52.0)
Hemoglobin: 14.9 g/dL (ref 13.0–17.0)
Immature Granulocytes: 0 %
Lymphocytes Relative: 30 %
Lymphs Abs: 1.1 K/uL (ref 0.7–4.0)
MCH: 29.1 pg (ref 26.0–34.0)
MCHC: 32.6 g/dL (ref 30.0–36.0)
MCV: 89.3 fL (ref 80.0–100.0)
Monocytes Absolute: 0.3 K/uL (ref 0.1–1.0)
Monocytes Relative: 9 %
Neutro Abs: 2.1 K/uL (ref 1.7–7.7)
Neutrophils Relative %: 57 %
Platelet Count: 172 K/uL (ref 150–400)
RBC: 5.12 MIL/uL (ref 4.22–5.81)
RDW: 16.4 % — ABNORMAL HIGH (ref 11.5–15.5)
WBC Count: 3.7 K/uL — ABNORMAL LOW (ref 4.0–10.5)
nRBC: 0 % (ref 0.0–0.2)

## 2024-09-11 LAB — TSH: TSH: 9.82 u[IU]/mL — ABNORMAL HIGH (ref 0.350–4.500)

## 2024-09-11 MED ORDER — SODIUM CHLORIDE 0.9 % IV SOLN
480.0000 mg | Freq: Once | INTRAVENOUS | Status: AC
  Administered 2024-09-11: 480 mg via INTRAVENOUS
  Filled 2024-09-11: qty 48

## 2024-09-11 MED ORDER — DIPHENOXYLATE-ATROPINE 2.5-0.025 MG PO TABS
2.0000 | ORAL_TABLET | Freq: Four times a day (QID) | ORAL | 1 refills | Status: AC | PRN
  Filled 2024-09-11: qty 120, 15d supply, fill #0

## 2024-09-11 MED ORDER — CABOZANTINIB S-MALATE 40 MG PO TABS
40.0000 mg | ORAL_TABLET | Freq: Every day | ORAL | 2 refills | Status: AC
  Filled 2024-09-11 – 2024-09-15 (×4): qty 30, 30d supply, fill #0
  Filled 2024-10-08: qty 30, 30d supply, fill #1
  Filled 2024-11-07: qty 30, 30d supply, fill #2

## 2024-09-11 MED ORDER — SODIUM CHLORIDE 0.9 % IV SOLN: INTRAVENOUS | Status: DC

## 2024-09-11 NOTE — Patient Instructions (Signed)
 [facility] - A DEPT OF [facility]  Discharge Instructions: Thank you for choosing [facility] to provide your oncology and hematology care.   If you have a lab appointment with the Cancer Center, please go directly to the Cancer Center and check in at the registration area.   Wear comfortable clothing and clothing appropriate for easy access to any Portacath or PICC line.   We strive to give you quality time with your provider. You may need to reschedule your appointment if you arrive late (15 or more minutes).  Arriving late affects you and other patients whose appointments are after yours.  Also, if you miss three or more appointments without notifying the office, you may be dismissed from the clinic at the provider's discretion.      For prescription refill requests, have your pharmacy contact our office and allow 72 hours for refills to be completed.    Today you received the following chemotherapy and/or immunotherapy agents: Opdivo  (nivolumab )      To help prevent nausea and vomiting after your treatment, we encourage you to take your nausea medication as directed.  BELOW ARE SYMPTOMS THAT SHOULD BE REPORTED IMMEDIATELY: *FEVER GREATER THAN 100.4 F (38 C) OR HIGHER *CHILLS OR SWEATING *NAUSEA AND VOMITING THAT IS NOT CONTROLLED WITH YOUR NAUSEA MEDICATION *UNUSUAL SHORTNESS OF BREATH *UNUSUAL BRUISING OR BLEEDING *URINARY PROBLEMS (pain or burning when urinating, or frequent urination) *BOWEL PROBLEMS (unusual diarrhea, constipation, pain near the anus) TENDERNESS IN MOUTH AND THROAT WITH OR WITHOUT PRESENCE OF ULCERS (sore throat, sores in mouth, or a toothache) UNUSUAL RASH, SWELLING OR PAIN  UNUSUAL VAGINAL DISCHARGE OR ITCHING   Items with * indicate a potential emergency and should be followed up as soon as possible or go to the Emergency Department if any problems should occur.  Please show the CHEMOTHERAPY ALERT CARD or  IMMUNOTHERAPY ALERT CARD at check-in to the Emergency Department and triage nurse.  Should you have questions after your visit or need to cancel or reschedule your appointment, please contact [facility] - A DEPT OF [facility]  Dept: [phone]  and follow the prompts.  Office hours are 8:00 a.m. to 4:30 p.m. Monday - Friday. Please note that voicemails left after 4:00 p.m. may not be returned until the following business day.  We are closed weekends and major holidays. You have access to a nurse at all times for urgent questions. Please call the main number to the clinic Dept: [phone] and follow the prompts.   For any non-urgent questions, you may also contact your provider using MyChart. We now offer e-Visits for anyone 67 and older to request care online for non-urgent symptoms. For details visit [email or web address].   Also download the MyChart app! Go to the app store, search MyChart, open the app, select [facility], and log in with your MyChart username and password.  Nivolumab  Injection What is this medication? NIVOLUMAB  (nye VOL ue mab) treats some types of cancer. It works by helping your immune system slow or stop the spread of cancer cells. It is a monoclonal antibody. This medicine may be used for other purposes; ask your health care provider or pharmacist if you have questions. COMMON BRAND NAME(S): Opdivo  What should I tell my care team before I take this medication? They need to know if you have any of these conditions: Allogeneic stem cell transplant (uses someone else's stem cells)  Autoimmune diseases, such as Crohn disease, ulcerative colitis, lupus History of chest radiation Nervous system problems, such as Guillain-Barre syndrome or myasthenia gravis Organ transplant An unusual or allergic reaction to nivolumab , other medications, foods, dyes, or preservatives Pregnant or trying to get pregnant Breast-feeding How should I use  this medication? This medication is infused into a vein. It is given in a hospital or clinic setting. A special MedGuide will be given to you before each treatment. Be sure to read this information carefully each time. Talk to your care team about the use of this medication in children. While it may be prescribed for children as young as 12 years for selected conditions, precautions do apply. Overdosage: If you think you have taken too much of this medicine contact a poison control center or emergency room at once. NOTE: This medicine is only for you. Do not share this medicine with others. What if I miss a dose? Keep appointments for follow-up doses. It is important not to miss your dose. Call your care team if you are unable to keep an appointment. What may interact with this medication? Interactions have not been studied. This list may not describe all possible interactions. Give your health care provider a list of all the medicines, herbs, non-prescription drugs, or dietary supplements you use. Also tell them if you smoke, drink alcohol, or use illegal drugs. Some items may interact with your medicine. What should I watch for while using this medication? Your condition will be monitored carefully while you are receiving this medication. You may need blood work while taking this medication. This medication may cause serious skin reactions. They can happen weeks to months after starting the medication. Contact your care team right away if you notice fevers or flu-like symptoms with a rash. The rash may be red or purple and then turn into blisters or peeling of the skin. You may also notice a red rash with swelling of the face, lips, or lymph nodes in your neck or under your arms. Tell your care team right away if you have any change in your eyesight. Talk to your care team if you are pregnant or think you might be pregnant. A negative pregnancy test is required before starting this medication. A  reliable form of contraception is recommended while taking this medication and for 5 months after the last dose. Talk to your care team about effective forms of contraception. Do not breast-feed while taking this medication and for 5 months after the last dose. What side effects may I notice from receiving this medication? Side effects that you should report to your care team as soon as possible: Allergic reactions--skin rash, itching, hives, swelling of the face, lips, tongue, or throat Dry cough, shortness of breath or trouble breathing Eye pain, redness, irritation, or discharge with blurry or decreased vision Heart muscle inflammation--unusual weakness or fatigue, shortness of breath, chest pain, fast or irregular heartbeat, dizziness, swelling of the ankles, feet, or hands Hormone gland problems--headache, sensitivity to light, unusual weakness or fatigue, dizziness, fast or irregular heartbeat, increased sensitivity to cold or heat, excessive sweating, constipation, hair loss, increased thirst or amount of urine, tremors or shaking, irritability Infusion reactions--chest pain, shortness of breath or trouble breathing, feeling faint or lightheaded Kidney injury (glomerulonephritis)--decrease in the amount of urine, red or dark brown urine, foamy or bubbly urine, swelling of the ankles, hands, or feet Liver injury--right upper belly pain, loss of appetite, nausea, light-colored stool, dark yellow or brown urine, yellowing skin  or eyes, unusual weakness or fatigue Pain, tingling, or numbness in the hands or feet, muscle weakness, change in vision, confusion or trouble speaking, loss of balance or coordination, trouble walking, seizures Rash, fever, and swollen lymph nodes Redness, blistering, peeling, or loosening of the skin, including inside the mouth Sudden or severe stomach pain, bloody diarrhea, fever, nausea, vomiting Side effects that usually do not require medical attention (report these  to your care team if they continue or are bothersome): Bone, joint, or muscle pain Diarrhea Fatigue Loss of appetite Nausea Skin rash This list may not describe all possible side effects. Call your doctor for medical advice about side effects. You may report side effects to FDA at 1-800-FDA-1088. Where should I keep my medication? This medication is given in a hospital or clinic. It will not be stored at home. NOTE: This sheet is a summary. It may not cover all possible information. If you have questions about this medicine, talk to your doctor, pharmacist, or health care provider.  2024 Elsevier/Gold Standard (2022-02-20 00:00:00)

## 2024-09-11 NOTE — Assessment & Plan Note (Signed)
 [id number] with lung metastasis, MMR normal, KRAS(+) [id number]  -diagnosed 08/2021 by colonoscopy for rectal bleeding and frequent BM. Staging MRI showed early extra mesorectal lymph node involvement.  -baseline CEA 571.84 on 09/06/21.  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 Xeloda  dose reduced due to elevated liver enzymes.  -due to new liver lesions, we switched to second-line FOLFIRI, with continued beva, on 02/21/22. He tolerates well overall.  - restaging CT scan on April 22 and June 26 showed stable disease overall. -CT 10/09/2023 showed mild disease progression in lungs, his tumor marker CEA has been going up also.  -I changed his chemo to lonsurf  and beva, he started on 12/13/2023, unfortunately progressed on CT 02/25/2024 -treatment changed to Stivarga . He required drug assistance  -restaging CT 07/01/2024 showed disease progression  -will change tx to Nivo and cabozantinib , he started on 07/15/24

## 2024-09-11 NOTE — Progress Notes (Signed)
 [facility]   [phone] Fax:(336) [phone]   Clinic Follow up Note   Patient Care Team: Petrina Pries, NP as PCP - General (Family Medicine) Croitoru, Jerel, MD as PCP - Cardiology (Cardiology) Pickenpack-Cousar, Fannie SAILOR, NP as Nurse Practitioner (Nurse Practitioner) Lanny Callander, MD as Consulting Physician (Hematology)  Date of Service:  09/11/2024  CHIEF COMPLAINT: f/u of metastatic rectal cancer   CURRENT THERAPY:  nivolumab  every 4 weeks and cabozantinib   Oncology History   Rectal cancer (HCC) [phone] with lung metastasis, MMR normal, KRAS(+) [id number]  -diagnosed 08/2021 by colonoscopy for rectal bleeding and frequent BM. Staging MRI showed early extra mesorectal lymph node involvement.  -baseline CEA 571.84 on 09/06/21.  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 Xeloda  dose reduced due to elevated liver enzymes.  -due to new liver lesions, we switched to second-line FOLFIRI, with continued beva, on 02/21/22. He tolerates well overall.  - restaging CT scan on April 22 and June 26 showed stable disease overall. -CT 10/09/2023 showed mild disease progression in lungs, his tumor marker CEA has been going up also.  -I changed his chemo to lonsurf  and beva, he started on 12/13/2023, unfortunately progressed on CT 02/25/2024 -treatment changed to Stivarga . He required drug assistance  -restaging CT 07/01/2024 showed disease progression  -will change tx to Nivo and cabozantinib , he started on 07/15/24   Assessment & Plan Rectal cancer on active chemotherapy Rectal cancer managed with cabozantinib . He is on the third treatment cycle with no reported issues with the infusion. He experiences fatigue, likely due to cold weather, but is able to care for himself and his grandchildren. No breathing issues reported. - Continue  cabozantinib  40 mg daily. - Scheduled next infusion after Thanksgiving. - Ordered CT scan before next visit in four weeks.  Diarrhea Intermittent diarrhea managed with Lomotil  (diphenoxylate  atropine ) as needed. He reports taking Lomotil  when diarrhea occurs, often related to dietary choices. - Refilled Lomotil  prescription.  Plan - He is clinically stable, tolerating treatment well, will continue cabozantinib , I refilled for him today. - Labs reviewed, will proceed with nivolumab  today and continue every 4 weeks - Restaging CT scan in 3 weeks   SUMMARY OF ONCOLOGIC HISTORY: Oncology History Overview Note  Cancer Staging Rectal cancer [facility]) Staging form: Colon and Rectum, AJCC 8th Edition - Clinical stage from 08/29/2021: Jorge [name], cM1 - Signed by Lanny Callander, MD on 08/29/2021    Rectal cancer (HCC)  07/23/2021 Imaging   CT AP  IMPRESSION: 1. Appearance of the rectum likely indicates a rectal mass suspicious for rectal carcinoma. Inflammatory process would be less likely. Direct visualization is suggested. 2. Cholelithiasis without evidence of acute cholecystitis. 3. Aortic atherosclerosis.   08/19/2021 Procedure   Colonoscopy, under Dr. Wilhelmenia  Impression: - Rectal tenderness, palpable rectal mass and hemorrhoids found on digital rectal exam. - Stool in the entire examined colon - lavaged with still inadequate clearance. - One 35 mm polyp at the hepatic flexure. Biopsied. Tattooed distal in case future endoscopic resection is considered - however, has larger issues with rectal mass currently as below. - Four, 5 to 11 mm polyps in the transverse colon and at the hepatic flexure, removed with a cold snare. Resected and retrieved. - Diverticulosis in the recto-sigmoid colon and in the sigmoid colon. - Rule out malignancy, partially obstructing tumor in the rectum and from 6 to 13 cm  proximal to the anus. Biopsied.   08/19/2021 Pathology Results   Diagnosis 1. Hepatic  Flexure Biopsy - TUBULAR ADENOMA WITHOUT HIGH-GRADE DYSPLASIA OR MALIGNANCY 2. Transverse Colon Biopsy, and hepatic flexure, polyps (4) - TUBULAR ADENOMA WITHOUT HIGH-GRADE DYSPLASIA OR MALIGNANCY - OTHER FRAGMENTS OF POLYPOID COLONIC MUCOSA WITH NO SPECIFIC HISTOPATHOLOGIC CHANGES - FOOD MATERIAL 3. Rectum, biopsy - ADENOCARCINOMA. SEE NOTE   08/22/2021 Initial Diagnosis   Rectal cancer (HCC)   08/26/2021 Imaging   IMPRESSION: 1. A 2.2 x 1.9 cm right middle lobe pulmonary nodule as well as a total of four right upper lobe pulmonary nodule and masses measuring up to 3.6 cm. Findings concerning for metastatic primary lung cancer versus less likely metastases in a patient with rectal cancer. Additional imaging evaluation or consultation with Pulmonology or Thoracic Surgery recommended. 2. No gross hilar adenopathy, noting limited sensitivity for the detection of hilar adenopathy on this noncontrast study. 3. Cholelithiasis. 4.  Emphysema (ICD10-J43.9). 5. At least left anterior descending coronary artery calcifications.   08/27/2021 Imaging   IMPRESSION: Rectal adenocarcinoma T stage: T4 B   Rectal adenocarcinoma N stage: N2 disease likely associated with early extra mesorectal lymph node involvement.   Distance from tumor to the internal anal sphincter is 1.2 cm.   Also with extramural venous involvement as described.   08/29/2021 Cancer Staging   Staging form: Colon and Rectum, AJCC 8th Edition - Clinical stage from 08/29/2021: Jorge [name], cM1 - Signed by Lanny Callander, MD on 08/29/2021 Stage prefix: Initial diagnosis   01/23/2022 Imaging   CT CAP w contrast IMPRESSION: 1. Mild interval decrease in size of multiple right-sided pulmonary nodules. No new suspicious pulmonary nodule or mass. 2. Interval development of approximately 10 new ill-defined hypoattenuating liver lesions ranging in size from approximately 5-10 mm. Imaging features suspicious for metastatic disease. MRI  abdomen with and without contrast may prove helpful to further evaluate. 3. Similar appearance of wall thickening in the rectum with perirectal edema. 4. Cholelithiasis. 5. Prostatomegaly. 6. Aortic Atherosclerosis (ICD10-I70.0).   11/21/2022 Imaging    IMPRESSION: 1. Stable exam. No new or progressive interval findings. 2. The primary rectosigmoid lesion described previously is not well seen on the current study. There is presacral and perirectal edema, likely treatment related. 3. No substantial change in appearance of bilateral pulmonary metastases. 4. Stable tiny hypodensities in both hepatic lobes. These were previously characterized as metastatic disease. No new liver lesions on today's exam. 5. Cholelithiasis. 6. Emphysema (ICD10-J43.9) and Aortic Atherosclerosis (ICD10-170.0)     02/25/2024 Imaging   CT Chest, Abdomen, and Pelvis with contrast  IMPRESSION: Multiple lung nodules are again identified. These are increased in size from previous.   Previous nodes however in the mediastinum and right lung hilum are improving.   Fatty infiltration with multiple low-attenuation liver lesions. Many these are slightly larger but more low in density today. Dilated gallbladder with stones.   Persistent wall thickening and nodularity along the rectum with adjacent stranding and fluid. Please correlate with exact location of neoplasm.   Overall mixed response.       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 These are increased in size from previous.  Previous nodes however in the mediastinum and right lung hilum are improving.   Fatty infiltration with multiple low-attenuation liver lesions. Many these are slightly larger but more low in density today.   Dilated gallbladder with stones. Persistent wall thickening and nodularity along the rectum with adjacent stranding and fluid. Please correlate with exact location of neoplasm.   Overall mixed response.       07/15/2024 -  Chemotherapy   Patient is on Treatment Plan : ANAL Nivolumab  (480) q28d        Discussed the use of AI scribe software for clinical note transcription with the patient, who gave verbal consent to proceed.  History of Present Illness Jorge [name] is a 73 year old male with colon cancer who presents for follow-up on medication management.  He is taking cabozantinib , one tablet daily, and has run out of this medication with only one pill remaining for tomorrow. Lomotil  is used for diarrhea, typically two tablets as needed after eating certain foods, and he requires a refill. He experiences occasional fatigue but denies any breathing issues and remains active in caring for his grandchildren.     All other systems were reviewed with the patient and are negative.  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    SURGICAL HISTORY: Past Surgical History:  Procedure Laterality Date   BRONCHIAL BIOPSY  09/27/2021   Procedure: BRONCHIAL BIOPSIES;  Surgeon: Brenna Adine CROME, DO;  Location: [facility];  Service: Pulmonary;;   BRONCHIAL BRUSHINGS  09/27/2021   Procedure: BRONCHIAL BRUSHINGS;  Surgeon: Brenna Adine CROME, DO;  Location: [facility];  Service:  Pulmonary;;   BRONCHIAL NEEDLE ASPIRATION BIOPSY  09/27/2021   Procedure: BRONCHIAL NEEDLE ASPIRATION BIOPSIES;  Surgeon: Brenna Adine CROME, DO;  Location: [facility];  Service: Pulmonary;;   COLONOSCOPY     IR IMAGING GUIDED PORT INSERTION  10/24/2021   left breast surgery Left 1968   VIDEO BRONCHOSCOPY WITH RADIAL ENDOBRONCHIAL ULTRASOUND  09/27/2021   Procedure: RADIAL ENDOBRONCHIAL ULTRASOUND;  Surgeon: Brenna Adine CROME, DO;  Location: [facility];  Service: Pulmonary;;    I have reviewed the social history and family history with the patient and they are unchanged from previous note.  ALLERGIES:  has no known allergies.  MEDICATIONS:  Current Outpatient Medications  Medication Sig Dispense Refill   amLODipine  (NORVASC ) 10 MG tablet Take 1 tablet (10 mg total) by mouth daily. 90 tablet 1   cabozantinib  (CABOMETYX ) 40 MG tablet Take 1 tablet (40 mg total) by mouth daily. Take on an empty stomach, 1 hour before or 2 hours after meals. 30 tablet 2   diphenoxylate -atropine  (LOMOTIL ) 2.5-0.025 MG tablet Take 2 tablets by mouth 4 (four) times daily as needed for diarrhea or loose stools. 120 tablet 1   gabapentin  (NEURONTIN ) 100 MG capsule TAKE 2 CAPSULES BY MOUTH 3 TIMES DAILY. 180 capsule 2   Multiple Vitamin (MULTIVITAMIN WITH MINERALS) TABS tablet Take 1 tablet by mouth daily.     oxyCODONE  (OXY IR/ROXICODONE ) 5 MG immediate release tablet Take 1 tablet (5 mg total) by  mouth every 6 (six) hours as needed for severe pain (pain score 7-10) or breakthrough pain. 60 tablet 0   No current facility-administered medications for this visit.   Facility-Administered Medications Ordered in Other Visits  Medication Dose Route Frequency Provider Last Rate Last Admin   0.9 %  sodium chloride  infusion   Intravenous Continuous Lanny Callander, MD   Stopped at 09/11/24 1233    PHYSICAL EXAMINATION: ECOG PERFORMANCE STATUS: 1 - Symptomatic but completely ambulatory  Vitals:   09/11/24 1000  BP:  138/88  Pulse: 83  Resp: 19  Temp: (!) 97.3 F (36.3 C)  SpO2: 96%   Wt Readings from Last 3 Encounters:  09/11/24 135 lb 8 oz (61.5 kg)  08/14/24 138 lb 12.8 oz (63 kg)  08/01/24 140 lb 4.8 oz (63.6 kg)     GENERAL:alert, no distress and comfortable SKIN: skin color, texture, turgor are normal, no rashes or significant lesions EYES: normal, Conjunctiva are pink and non-injected, sclera clear NECK: supple, thyroid normal size, non-tender, without nodularity LYMPH:  no palpable lymphadenopathy in the cervical, axillary  LUNGS: clear to auscultation and percussion with normal breathing effort HEART: regular rate & rhythm and no murmurs and no lower extremity edema ABDOMEN:abdomen soft, non-tender and normal bowel sounds Musculoskeletal:no cyanosis of digits and no clubbing  NEURO: alert & oriented x 3 with fluent speech, no focal motor/sensory deficits  Physical Exam    LABORATORY DATA:  I have reviewed the data as listed    Latest Ref Rng & Units 09/11/2024   10:03 AM 08/14/2024   11:11 AM 08/06/2024    8:07 AM  CBC  WBC 4.0 - 10.5 K/uL 3.7  3.4  2.7   Hemoglobin 13.0 - 17.0 g/dL 85.0  84.9  84.6   Hematocrit 39.0 - 52.0 % 45.7  46.0  45.5   Platelets 150 - 400 K/uL 172  172  156         Latest Ref Rng & Units 09/11/2024   10:03 AM 08/14/2024   11:11 AM 08/06/2024    8:07 AM  CMP  Glucose 70 - 99 mg/dL 894  894  899   BUN 8 - 23 mg/dL 8  8  12    Creatinine 0.61 - 1.24 mg/dL 9.27  9.31  9.19   Sodium 135 - 145 mmol/L 138  137  137   Potassium 3.5 - 5.1 mmol/L 3.6  4.1  4.0   Chloride 98 - 111 mmol/L 102  103  103   CO2 22 - 32 mmol/L 32  31  31   Calcium  8.9 - 10.3 mg/dL 8.5  8.8  8.2   Total Protein 6.5 - 8.1 g/dL 6.8  6.9  6.7   Total Bilirubin 0.0 - 1.2 mg/dL 0.4  0.4  0.3   Alkaline Phos 38 - 126 U/L 135  115  132   AST 15 - 41 U/L 51  40  46   ALT 0 - 44 U/L 44  27  31       RADIOGRAPHIC STUDIES: I have personally reviewed the radiological images as  listed and agreed with the findings in the report. No results found.    Orders Placed This Encounter  Procedures   CT CHEST ABDOMEN PELVIS W CONTRAST    Standing Status:   Future    Expected Date:   10/03/2024    Expiration Date:   09/11/2025    If indicated for the ordered procedure, I authorize the administration of  contrast media per Radiology protocol:   Yes    Does the patient have a contrast media/X-ray dye allergy?:   No    Preferred imaging location?:   [facility]    Release to patient:   Immediate    If indicated for the ordered procedure, I authorize the administration of oral contrast media per Radiology protocol:   Yes   CBC with Differential (Cancer Center Only)    Standing Status:   Future    Expected Date:   11/04/2024    Expiration Date:   11/04/2025   CMP (Cancer Center only)    Standing Status:   Future    Expected Date:   11/04/2024    Expiration Date:   11/04/2025   CBC with Differential (Cancer Center Only)    Standing Status:   Future    Expected Date:   12/02/2024    Expiration Date:   12/02/2025   CMP (Cancer Center only)    Standing Status:   Future    Expected Date:   12/02/2024    Expiration Date:   12/02/2025   T4    Standing Status:   Future    Expected Date:   12/02/2024    Expiration Date:   12/02/2025   TSH    Standing Status:   Future    Expected Date:   12/02/2024    Expiration Date:   12/02/2025   All questions were answered. The patient knows to call the clinic with any problems, questions or concerns. No barriers to learning was detected. The total time spent in the appointment was 25 minutes, including review of chart and various tests results, discussions about plan of care and coordination of care plan     Onita Mattock, MD 09/11/2024

## 2024-09-12 ENCOUNTER — Other Ambulatory Visit (HOSPITAL_COMMUNITY): Payer: Self-pay

## 2024-09-12 ENCOUNTER — Other Ambulatory Visit: Payer: Self-pay

## 2024-09-12 ENCOUNTER — Encounter: Payer: Self-pay | Admitting: Hematology

## 2024-09-12 LAB — T4: T4, Total: 4.6 ug/dL (ref 4.5–12.0)

## 2024-09-12 NOTE — Progress Notes (Signed)
 erroneous

## 2024-09-14 ENCOUNTER — Encounter (HOSPITAL_COMMUNITY): Payer: Self-pay

## 2024-09-15 ENCOUNTER — Other Ambulatory Visit: Payer: Self-pay

## 2024-09-15 ENCOUNTER — Encounter: Payer: Self-pay | Admitting: Hematology

## 2024-09-15 NOTE — Progress Notes (Signed)
 Specialty Pharmacy Refill Coordination Note  Jorge [name] is a 73 y.o. male contacted today regarding refills of specialty medication(s) Cabozantinib  S-Malate (CABOMETYX )   Patient requested Delivery   Delivery date: 09/16/24   Verified address: [address]  [place] [phone]   Medication will be filled on: 09/15/24

## 2024-09-17 ENCOUNTER — Other Ambulatory Visit: Payer: Self-pay | Admitting: Nurse Practitioner

## 2024-09-17 DIAGNOSIS — G893 Neoplasm related pain (acute) (chronic): Secondary | ICD-10-CM

## 2024-09-17 DIAGNOSIS — Z515 Encounter for palliative care: Secondary | ICD-10-CM

## 2024-09-17 DIAGNOSIS — C20 Malignant neoplasm of rectum: Secondary | ICD-10-CM

## 2024-09-17 MED ORDER — OXYCODONE HCL 5 MG PO TABS: 5.0000 mg | ORAL_TABLET | Freq: Four times a day (QID) | ORAL | 0 refills | Status: DC | PRN

## 2024-09-18 ENCOUNTER — Other Ambulatory Visit: Payer: Self-pay | Admitting: Hematology

## 2024-09-18 ENCOUNTER — Inpatient Hospital Stay: Admitting: Nurse Practitioner

## 2024-09-23 ENCOUNTER — Other Ambulatory Visit (HOSPITAL_COMMUNITY): Payer: Self-pay

## 2024-09-23 ENCOUNTER — Inpatient Hospital Stay: Admitting: Nurse Practitioner

## 2024-09-24 ENCOUNTER — Encounter: Payer: Self-pay | Admitting: Hematology

## 2024-09-24 ENCOUNTER — Other Ambulatory Visit: Payer: Self-pay | Admitting: Nurse Practitioner

## 2024-09-24 DIAGNOSIS — C20 Malignant neoplasm of rectum: Secondary | ICD-10-CM

## 2024-09-24 DIAGNOSIS — G893 Neoplasm related pain (acute) (chronic): Secondary | ICD-10-CM

## 2024-09-24 DIAGNOSIS — M792 Neuralgia and neuritis, unspecified: Secondary | ICD-10-CM

## 2024-09-24 NOTE — Progress Notes (Signed)
 Pt did not answer for phone visit, appt canceled.

## 2024-10-01 ENCOUNTER — Other Ambulatory Visit: Payer: Self-pay | Admitting: Nurse Practitioner

## 2024-10-01 DIAGNOSIS — C20 Malignant neoplasm of rectum: Secondary | ICD-10-CM

## 2024-10-01 DIAGNOSIS — Z515 Encounter for palliative care: Secondary | ICD-10-CM

## 2024-10-01 DIAGNOSIS — G893 Neoplasm related pain (acute) (chronic): Secondary | ICD-10-CM

## 2024-10-01 MED ORDER — OXYCODONE HCL 5 MG PO TABS: 5.0000 mg | ORAL_TABLET | Freq: Four times a day (QID) | ORAL | 0 refills | Status: DC | PRN

## 2024-10-03 ENCOUNTER — Ambulatory Visit (HOSPITAL_COMMUNITY)

## 2024-10-07 ENCOUNTER — Observation Stay (HOSPITAL_COMMUNITY)
Admission: EM | Admit: 2024-10-07 | Discharge: 2024-10-12 | DRG: 545 | Disposition: A | Attending: Internal Medicine | Admitting: Internal Medicine

## 2024-10-07 ENCOUNTER — Other Ambulatory Visit: Payer: Self-pay

## 2024-10-07 ENCOUNTER — Emergency Department (HOSPITAL_COMMUNITY)

## 2024-10-07 ENCOUNTER — Encounter (HOSPITAL_COMMUNITY): Payer: Self-pay | Admitting: Emergency Medicine

## 2024-10-07 DIAGNOSIS — Z95828 Presence of other vascular implants and grafts: Secondary | ICD-10-CM

## 2024-10-07 DIAGNOSIS — R9389 Abnormal findings on diagnostic imaging of other specified body structures: Secondary | ICD-10-CM

## 2024-10-07 DIAGNOSIS — M25551 Pain in right hip: Secondary | ICD-10-CM

## 2024-10-07 DIAGNOSIS — R29898 Other symptoms and signs involving the musculoskeletal system: Secondary | ICD-10-CM | POA: Insufficient documentation

## 2024-10-07 DIAGNOSIS — C20 Malignant neoplasm of rectum: Secondary | ICD-10-CM | POA: Diagnosis present

## 2024-10-07 DIAGNOSIS — R4182 Altered mental status, unspecified: Secondary | ICD-10-CM | POA: Diagnosis not present

## 2024-10-07 DIAGNOSIS — W19XXXA Unspecified fall, initial encounter: Secondary | ICD-10-CM

## 2024-10-07 DIAGNOSIS — G9340 Encephalopathy, unspecified: Secondary | ICD-10-CM

## 2024-10-07 DIAGNOSIS — C7800 Secondary malignant neoplasm of unspecified lung: Secondary | ICD-10-CM | POA: Diagnosis present

## 2024-10-07 LAB — CBC
HCT: 57.1 % — ABNORMAL HIGH (ref 39.0–52.0)
Hemoglobin: 18.2 g/dL — ABNORMAL HIGH (ref 13.0–17.0)
MCH: 30.2 pg (ref 26.0–34.0)
MCHC: 31.9 g/dL (ref 30.0–36.0)
MCV: 94.7 fL (ref 80.0–100.0)
Platelets: 277 K/uL (ref 150–400)
RBC: 6.03 MIL/uL — ABNORMAL HIGH (ref 4.22–5.81)
RDW: 17.9 % — ABNORMAL HIGH (ref 11.5–15.5)
WBC: 4 K/uL (ref 4.0–10.5)
nRBC: 0 % (ref 0.0–0.2)

## 2024-10-07 LAB — URINALYSIS, W/ REFLEX TO CULTURE (INFECTION SUSPECTED)
Bacteria, UA: NONE SEEN
Bilirubin Urine: NEGATIVE
Glucose, UA: NEGATIVE mg/dL
Hgb urine dipstick: NEGATIVE
Ketones, ur: NEGATIVE mg/dL
Leukocytes,Ua: NEGATIVE
Nitrite: NEGATIVE
Protein, ur: >=300 mg/dL — AB
Specific Gravity, Urine: 1.023 (ref 1.005–1.030)
pH: 6 (ref 5.0–8.0)

## 2024-10-07 LAB — BASIC METABOLIC PANEL WITH GFR
Anion gap: 14 (ref 5–15)
BUN: 9 mg/dL (ref 8–23)
CO2: 27 mmol/L (ref 22–32)
Calcium: 9.8 mg/dL (ref 8.9–10.3)
Chloride: 98 mmol/L (ref 98–111)
Creatinine, Ser: 0.79 mg/dL (ref 0.61–1.24)
GFR, Estimated: >60 mL/min (ref >60)
Glucose, Bld: 97 mg/dL (ref 70–99)
Potassium: 4 mmol/L (ref 3.5–5.1)
Sodium: 139 mmol/L (ref 135–145)

## 2024-10-07 LAB — TSH: TSH: 6.23 u[IU]/mL — ABNORMAL HIGH (ref 0.350–4.500)

## 2024-10-07 LAB — HEPATIC FUNCTION PANEL
ALT: 59 U/L — ABNORMAL HIGH (ref 0–44)
AST: 87 U/L — ABNORMAL HIGH (ref 15–41)
Albumin: 3.8 g/dL (ref 3.5–5.0)
Alkaline Phosphatase: 192 U/L — ABNORMAL HIGH (ref 38–126)
Bilirubin, Direct: 0.2 mg/dL (ref 0.0–0.2)
Indirect Bilirubin: 0.5 mg/dL (ref 0.3–0.9)
Total Bilirubin: 0.7 mg/dL (ref 0.0–1.2)
Total Protein: 8.7 g/dL — ABNORMAL HIGH (ref 6.5–8.1)

## 2024-10-07 LAB — AMMONIA: Ammonia: 47 umol/L — ABNORMAL HIGH (ref 9–35)

## 2024-10-07 LAB — T4, FREE: Free T4: 0.78 ng/dL (ref 0.61–1.12)

## 2024-10-07 MED ORDER — LACTATED RINGERS IV BOLUS: 1000.0000 mL | Freq: Once | INTRAVENOUS | Status: DC

## 2024-10-07 MED ORDER — GADOBUTROL 1 MMOL/ML IV SOLN
6.0000 mL | Freq: Once | INTRAVENOUS | Status: AC | PRN
  Administered 2024-10-07: 6 mL via INTRAVENOUS

## 2024-10-07 MED ORDER — MELATONIN 5 MG PO TABS
5.0000 mg | ORAL_TABLET | Freq: Every evening | ORAL | Status: DC | PRN
  Administered 2024-10-09 – 2024-10-10 (×2): 5 mg via ORAL
  Filled 2024-10-07 (×2): qty 1

## 2024-10-07 MED ORDER — ACETAMINOPHEN 500 MG PO TABS
500.0000 mg | ORAL_TABLET | Freq: Four times a day (QID) | ORAL | Status: DC | PRN
  Administered 2024-10-09 – 2024-10-11 (×3): 500 mg via ORAL
  Filled 2024-10-07 (×4): qty 1

## 2024-10-07 MED ORDER — AMLODIPINE BESYLATE 5 MG PO TABS
10.0000 mg | ORAL_TABLET | Freq: Once | ORAL | Status: AC
  Administered 2024-10-07: 10 mg via ORAL
  Filled 2024-10-07: qty 2

## 2024-10-07 MED ORDER — AMLODIPINE BESYLATE 5 MG PO TABS
10.0000 mg | ORAL_TABLET | Freq: Every day | ORAL | Status: DC
  Administered 2024-10-08 – 2024-10-12 (×5): 10 mg via ORAL
  Filled 2024-10-07 (×3): qty 2
  Filled 2024-10-07: qty 1
  Filled 2024-10-07: qty 2

## 2024-10-07 MED ORDER — LACTATED RINGERS IV SOLN: INTRAVENOUS | Status: DC

## 2024-10-07 MED ORDER — POLYETHYLENE GLYCOL 3350 17 G PO PACK: 17.0000 g | PACK | Freq: Every day | ORAL | Status: DC | PRN

## 2024-10-07 MED ORDER — ADULT MULTIVITAMIN W/MINERALS CH
1.0000 | ORAL_TABLET | Freq: Every day | ORAL | Status: DC
  Administered 2024-10-07 – 2024-10-12 (×6): 1 via ORAL
  Filled 2024-10-07 (×6): qty 1

## 2024-10-07 MED ORDER — OXYCODONE HCL 5 MG PO TABS
5.0000 mg | ORAL_TABLET | Freq: Four times a day (QID) | ORAL | Status: DC | PRN
  Administered 2024-10-07 – 2024-10-08 (×3): 5 mg via ORAL
  Filled 2024-10-07 (×3): qty 1

## 2024-10-07 MED ORDER — CHLORHEXIDINE GLUCONATE CLOTH 2 % EX PADS
6.0000 | MEDICATED_PAD | Freq: Every day | CUTANEOUS | Status: DC
  Administered 2024-10-08 – 2024-10-12 (×5): 6 via TOPICAL

## 2024-10-07 MED ORDER — PROCHLORPERAZINE EDISYLATE 10 MG/2ML IJ SOLN: 5.0000 mg | Freq: Four times a day (QID) | INTRAMUSCULAR | Status: DC | PRN

## 2024-10-07 MED ORDER — SODIUM CHLORIDE 0.9% FLUSH
10.0000 mL | Freq: Two times a day (BID) | INTRAVENOUS | Status: DC
  Administered 2024-10-07 – 2024-10-10 (×6): 10 mL
  Administered 2024-10-10: 30 mL
  Administered 2024-10-11 – 2024-10-12 (×3): 10 mL

## 2024-10-07 MED ORDER — SODIUM CHLORIDE 0.9% FLUSH: 10.0000 mL | INTRAVENOUS | Status: DC | PRN

## 2024-10-07 NOTE — ED Notes (Signed)
 Bladder scan 0ml

## 2024-10-07 NOTE — ED Notes (Signed)
 MD and RN notified of pt's BP.

## 2024-10-07 NOTE — Plan of Care (Signed)
 Brief phone discussion with Dr. Rogelia.  [name] has a hx of colon adenocarcinoma and undergoing treatment. He is coming in with difficulty with ambulation since thanksgiving. There is concern that his right leg maybe weak and weakness is possibly out of proportion to pain. He is stumbling. 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  Reviewing the location of the infarct, does not explain R leg weakness. Do think he would still benefit from MRI Brain with and without contrast for further evaluation to determine if this is acute or chronic or if this is a met.  Doretta Remmert [facility]

## 2024-10-07 NOTE — ED Provider Notes (Signed)
 [place] [facility]MENT AT [facility] Provider Note   CSN: [id number] Arrival date & time: 10/07/24  1044     History Chief Complaint  Patient presents with   Urinary Retention   Hip Pain    Right    HPI: Jorge [name] is a 73 y.o. male with history pertinent metastatic rectal adenocarcinoma, who presents complaining of fall, altered mental status. Patient arrived via POV accompanied by granddaughter.  History provided by patient and relative: Granddaughter.  No interpreter required during this encounter.  Patient and granddaughter state that he has had several days of right-sided hip pain.  Reports that he has had difficulty walking since his recent hospitalization, has a walker at home, however does not use it because of the stairs, and requires a lot of assistance from family members.  Reports that patient has been complaining of right sided hip pain for approximately 5 days and has had numerous falls over the past several days.  Reports that they had a trip while down the stairs earlier today, however he did not hit his head, lose consciousness.  Complains only of right hip pain, however granddaughter notes that he has been more confused than baseline since this happened, for example not remembering the name of the pharmacy, the hospital [facility].  Patient denies headache, neck pain, chest pain, shortness of breath, nausea, vomiting, diarrhea, dysuria.  Notably triage note states that patient also is complaining of urinary retention, patient denies this to me.  Patient's recorded medical, surgical, social, medication list and allergies were reviewed in the Snapshot window as part of the initial history.   Prior to Admission medications   Medication Sig Start Date End Date Taking? Authorizing Provider  amLODipine  (NORVASC ) 10 MG tablet TAKE 1 TABLET BY MOUTH EVERY DAY 09/18/24   Boscia, Heather E, NP  cabozantinib  (CABOMETYX ) 40 MG tablet Take 1 tablet (40 mg  total) by mouth daily. Take on an empty stomach, 1 hour before or 2 hours after meals. 09/11/24   Lanny Callander, MD  diphenoxylate -atropine  (LOMOTIL ) 2.5-0.025 MG tablet Take 2 tablets by mouth 4 (four) times daily as needed for diarrhea or loose stools. 09/11/24   Lanny Callander, MD  gabapentin  (NEURONTIN ) 100 MG capsule TAKE 2 CAPSULES BY MOUTH 3 TIMES A DAY 09/24/24   Boscia, Heather E, NP  Multiple Vitamin (MULTIVITAMIN WITH MINERALS) TABS tablet Take 1 tablet by mouth daily.    [provider]  oxyCODONE  (OXY IR/ROXICODONE ) 5 MG immediate release tablet Take 1 tablet (5 mg total) by mouth every 6 (six) hours as needed for severe pain (pain score 7-10) or breakthrough pain. 10/01/24   Pickenpack-Cousar, Athena N, NP  ondansetron  (ZOFRAN ) 8 MG tablet Take 1 tablet (8 mg total) by mouth every 8 (eight) hours as needed for nausea or vomiting. 09/09/21   Lanny Callander, MD     Allergies: Patient has no known allergies.   Review of Systems   ROS as per HPI  Physical Exam Updated Vital Signs BP (!) 175/105 (BP Location: Left Arm)   Pulse 88   Temp 97.9 F (36.6 C) (Oral)   Resp 19   SpO2 100%  Physical Exam Vitals and nursing note reviewed.  Constitutional:      General: He is not in acute distress.    Appearance: He is well-developed.  HENT:     Head: Normocephalic and atraumatic.  Eyes:     Conjunctiva/sclera: Conjunctivae normal.  Cardiovascular:     Rate and Rhythm:  Normal rate and regular rhythm.     Heart sounds: No murmur heard. Pulmonary:     Effort: Pulmonary effort is normal. No respiratory distress.     Breath sounds: Normal breath sounds.  Abdominal:     Palpations: Abdomen is soft.     Tenderness: There is no abdominal tenderness.  Musculoskeletal:        General: No swelling.     Cervical back: Neck supple.     Comments: Chest wall stable, nontender to palpation, pelvis stable to AP and lateral compression, tender to right sided palpation  Skin:    General: Skin is  warm and dry.     Capillary Refill: Capillary refill takes less than 2 seconds.  Neurological:     Mental Status: He is alert.     Motor: Weakness (4/5 strength in RLE versus 5/5 elsewhere, possibly pain limited rather than true weakness) present.  Psychiatric:        Mood and Affect: Mood normal.     ED Course/ Medical Decision Making/ A&P    Procedures Procedures   Medications Ordered in ED Medications  lactated ringers  bolus 1,000 mL (has no administration in time range)  oxyCODONE  (Oxy IR/ROXICODONE ) immediate release tablet 5 mg (5 mg Oral Given 10/07/24 1504)  amLODipine  (NORVASC ) tablet 10 mg (10 mg Oral Given 10/07/24 1326)  gadobutrol  (GADAVIST ) 1 MMOL/ML injection 6 mL (6 mLs Intravenous Contrast Given 10/07/24 1657)    Medical Decision Making:   Jorge [name] is a 73 y.o. male who presents for altered mental status, falls as per above.  Physical exam is pertinent for 4/5 strength in right lower extremity, tenderness to palpation of right lateral hip.   The differential includes but is not limited to fracture, dislocation, infection, UTI, progression of malignancy, intracranial mass lesion, stroke, ICH, metabolic encephalopathy, dementia.  Independent historian: Relative: Granddaughter  External data reviewed: Labs: reviewed prior labs for baseline  Initial Plan:  Screening labs including CBC and Metabolic panel to evaluate for infectious or metabolic etiology of disease.  Screening ammonia in the setting of altered mental status Urinalysis with reflex culture ordered to evaluate for UTI or relevant urologic/nephrologic pathology.  Screening CT head and CT C-spine in the setting of age and fall as well as altered mental status CT of the pelvis given x-ray of the hip from triage does not reveal etiology of hip pain  Labs: Ordered, Independent interpretation, and Details: BMP without AKI or emergent electrolyte derangement.  CBC without leukocytosis, anemia,  thrombocytopenia.  Mild nonspecific erythrocytosis.  LFTs with mild nonspecific elevation, patient has previously demonstrated mild similar elevation of AST and alk phos.  UA and magnesium pending at the time of handoff.  Radiology: Ordered, Independent interpretation, Details: Personally reviewed plain film of the hip, do not appreciate displaced fracture or dislocation.  CT of the head without ICH, displaced fracture, dislocation, overt loss of gray/white matter differentiation.  CT of the C-spine without displaced fracture, dislocation, traumatic alignment.  CT of the pelvis without overt birdie derangement., and All images reviewed independently.  Agree with radiology report at this time.   CT Head Wo Contrast Result Date: 10/07/2024 EXAM: CT HEAD WITHOUT CONTRAST 10/07/2024 02:13:21 PM TECHNIQUE: CT of the head was performed without the administration of intravenous contrast. Automated exposure control, iterative reconstruction, and/or weight based adjustment of the mA/kV was utilized to reduce the radiation dose to as low as reasonably achievable. COMPARISON: CT head 11/24/2023. CLINICAL HISTORY: Head trauma, minor (Age >=  65y). FINDINGS: BRAIN AND VENTRICLES: No acute hemorrhage. There is transcortical hypoattenuation involving the left parietal lobe, new since prior exam. Overall similar background mild scattered white matter hypodensities, which are nonspecific but most commonly represent chronic microvascular ischemic changes. No hydrocephalus. No extra-axial collection. No mass effect or midline shift. ORBITS: No acute abnormality. SINUSES: Moderate left mastoid effusion and left middle ear opacification, increased since prior exam. Increased mild right mastoid effusion. Decreased paranasal sinus inflammatory mucosal thickening SOFT TISSUES AND SKULL: No acute soft tissue abnormality. No skull fracture. IMPRESSION: 1. Since 11/24/2023, new left parietal hypoattenuation may represent recent ischemic  infarction. Recommend MRI brain with IV contrast for further evaluation. 2. No acute intracranial hemorrhage. Electronically signed by: prentice spade 10/07/2024 02:55 PM EST RP Workstation: [id number]   CT PELVIS WO CONTRAST Result Date: 10/07/2024 CLINICAL DATA:  Suspect hip fracture. History of metastatic rectal carcinoma. Fall with right hip pain. EXAM: CT PELVIS WITHOUT CONTRAST TECHNIQUE: Multidetector CT imaging of the pelvis was performed following the standard protocol without intravenous contrast. RADIATION DOSE REDUCTION: This exam was performed according to the departmental dose-optimization program which includes automated exposure control, adjustment of the mA and/or kV according to patient size and/or use of iterative reconstruction technique. COMPARISON:  CT 07/01/2024 and plain x-ray right hip 10/07/2024 FINDINGS: Urinary Tract:  No abnormality visualized. Bowel: Possible small bowel loop in the left lower quadrant at the upper limits of normal in diameter. Bowel is otherwise unremarkable. Vascular/Lymphatic: Calcified plaque over the abdominal aorta which is normal caliber. Mild calcified plaque over the external iliac arteries. No adenopathy. Reproductive:  No mass or other significant abnormality Other: No free fluid over the lower abdomen/pelvis. Moderate cholelithiasis present. Musculoskeletal: No acute fracture or dislocation. Minimal degenerative change of the hips. Mild degenerate change of the spine. IMPRESSION: 1. No acute fracture or dislocation. 2. Moderate cholelithiasis. 3. Aortic atherosclerosis. Aortic Atherosclerosis (ICD10-I70.0). Electronically Signed   By: Toribio Agreste M.D.   On: 10/07/2024 14:40   CT Cervical Spine Wo Contrast Result Date: 10/07/2024 EXAM: CT CERVICAL SPINE WITHOUT CONTRAST 10/07/2024 02:13:21 PM TECHNIQUE: CT of the cervical spine was performed without the administration of intravenous contrast. Multiplanar reformatted images are provided for review.  Automated exposure control, iterative reconstruction, and/or weight based adjustment of the mA/kV was utilized to reduce the radiation dose to as low as reasonably achievable. COMPARISON: None available. CLINICAL HISTORY: Neck trauma (Age >= 65y) FINDINGS: CERVICAL SPINE: BONES AND ALIGNMENT: No acute fracture or traumatic malalignment. DEGENERATIVE CHANGES: Early degenerative disc and facet disease bilaterally. SOFT TISSUES: No prevertebral soft tissue swelling. LUNGS: Partially visualized in the anterior right upper lobe of the chest is a 2.2 x 1.4 cm irregular masslike area in the right apex. This was noted on prior chest CT from 07/01/2024 when this area measured 2.5 x 1.9 cm. Recommend continued attention on follow-up chest imaging. IMPRESSION: 1. No acute abnormality of the cervical spine. 2. Partially visualized 2.2 x 1.4 cm irregular mass in the right lung apex, decreased in size from 2.5 x 1.9 cm on 07/01/2024; recommend continued follow-up chest imaging. Electronically signed by: Franky Crease MD 10/07/2024 02:37 PM EST RP Workstation: [id number]   DG Hip Unilat  With Pelvis 2-3 Views Right Result Date: 10/07/2024 EXAM: 2 VIEW(S) XRAY OF THE UNILATERAL HIP 10/07/2024 11:36:00 AM COMPARISON: Pelvic CT of 07/01/2024. CLINICAL HISTORY: Fall/Mobility issues. FINDINGS: BONES AND JOINTS: No acute fracture or focal osseous lesion. Mild bilateral hip joint space narrowing and subchondral sclerosis are mild and  relatively symmetric, consistent with mild bilateral hip osteoarthritis. SOFT TISSUES: Presumed vascular calcifications within the pelvis. INCIDENTAL FINDINGS: Gallstones. IMPRESSION: 1. No acute findings. 2. Mild bilateral hip osteoarthritis. 3. Cholelithiasis Electronically signed by: Rockey Kilts MD 10/07/2024 12:05 PM EST RP Workstation: [id number]    EKG/Medicine tests: Not indicated EKG Interpretation:                  Interventions: Home amlodipine   See the EMR for full details regarding lab  and imaging results.  Triage note notes that patient is complaining of urinary retention.  Patient denies any urinary concerns on my history, has no suprapubic tenderness to palpation.  Bladder scan performed by nursing and demonstrates that patient has no significant urine in the bladder, CT of the pelvis also demonstrates decompression of the bladder.  Given patient does not complain of urinary retention, does not have evidence of urinary retention on imaging or bladder scan, do not feel that patient requires further workup for this presently.  Will obtain UA to evaluate for underlying UTI.  Patient did request his home amlodipine , states that he did not take this this morning.  No medication ordered prior to request.  With regard to altered mental status and fall, do feel that patient warrants imaging of this head, C-spine.  X-ray of the hip was obtained in triage and does not demonstrate fracture or dislocation, therefore do feel that patient warrants continuous CT of the hip to evaluate for occult fracture.  These imaging were obtained and do not demonstrate acute traumatic process, additionally labs do not demonstrate leukocytosis concerning for significant infectious/inflammatory etiology, and CMP does not demonstrate concern for metabolic encephalopathy.  CT of the head does demonstrate possible age-indeterminate left parietal infarct.  Did discuss this finding with Dr. Vanessa, neurologist on-call patient subsequently symptoms are in the right lower extremity where he does have mild weakness, though this might be due to pain in the right lower extremity.  Additionally parietal location of infarct is not consistent with vascular distribution of patient's deficits.  Recommended further analysis with MRI, will obtain MRI with and without contrast given patient also has history of metastatic malignancy.  Plan at the time of signout, follow-up UA, ammonia, MRI, given patient with persistent altered  mental status not at baseline, do anticipate the patient will require admission for further workup the studies are not revealing of etiology of patient's symptoms.  Presentation is most consistent with acute complicated illness and Current presentation is complicated by underlying chronic conditions  Discussion of management or test interpretations with external provider(s): Dr. Vanessa, neurology  Risk Drugs:Prescription drug management Treatment: Pending at the time of handoff, likely admission  Disposition: HANDOFF: At the time of signout, the patients labs and imaging had not yet been completed. I transferred care of the patient at the time of signout to Dr. Lenor. I informed the incoming care provider of the patient's history, status, and management plan. I addressed all of their concerns and/or questions to the best of my ability. Please refer to the incoming care provider's note for details regarding the remainder of the patient's ED course and disposition.  MDM generated using voice dictation software and may contain dictation errors.  Please contact me for any clarification or with any questions.  Clinical Impression:  1. Altered mental status, unspecified altered mental status type   2. Fall, initial encounter   3. Right hip pain      Data Unavailable   Final Clinical Impression(s) / ED  Diagnoses Final diagnoses:  Altered mental status, unspecified altered mental status type  Fall, initial encounter  Right hip pain    Rx / DC Orders ED Discharge Orders     None        Rogelia Jerilynn RAMAN, MD 10/07/24 1718

## 2024-10-07 NOTE — ED Notes (Signed)
 Went to update pt's vitals. Pt is in MRI. Will update once he returns.

## 2024-10-07 NOTE — ED Triage Notes (Signed)
 Pt arriving POV for urinary retention and right hip pain. Pt reports his hip pain is affecting his ability to walk. Pt has been having falls at home including today.

## 2024-10-07 NOTE — ED Notes (Signed)
 Pt states that he has high blood pressure but did not take his BP medication today.

## 2024-10-07 NOTE — ED Provider Notes (Signed)
 Care was taken over from Dr. Rogelia.  Patient presented with recent weakness with falls that started around Thanksgiving and confusion that started today.  Family member at bedside says that patient does not have any confusion at baseline.  He is oriented x 2.  He has difficulty answering questions.  I am not sure whether it is confusion or degree of aphasia.  However he did have an MRI which does not show any definite acute infarct.  No brain lesion was noted.  Urine does not appear infected.  Other labs are nonconcerning.  Will add a TSH.  Will consult hospitalist for admission. Discussed with Dr. Shona.   Lenor Hollering, MD 10/07/24 [phone]

## 2024-10-07 NOTE — H&P (Signed)
 History and Physical  Jorge [name] FMW:[id number] DOB: Oct 01, 1951 DOA: 10/07/2024  Referring physician: Dr. Lenor, EDP  PCP: Petrina Pries, NP  Outpatient Specialists: Medical oncology. Patient coming from: Home.  Chief Complaint: Altered mental status.  HPI: Jorge [name] is a 73 y.o. male with medical history significant for metastatic colon cancer with hepatic involvement, lung metastasis, on Nivolumab  every 4 weeks and Cabometyx  daily, hypertension, chronic HFpEF, current smoker, who presents to the ER due to altered mental status, generalized weakness and near fall today.  Per family member at bedside, at baseline the patient is alert and oriented x 4.  They have noted some confusion since around Thanksgiving.  He had a near fall event while walking up the stairs.  They became concerned and brought him to the ER for further evaluation.  In the ER, alert with some mild confusion.  Hypertensive with BP 175/105.  Lab studies notable for elevated liver chemistries and elevated ammonia level 47.  No focal deficits on exam.  The patient had an MRI brain with and without contrast that revealed no acute/subacute infarct or definite intracranial metastatic disease.  Which showed innumerable chronic cerebral microhemorrhages suggestive of cerebral amyloid angiopathy.  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  TRH, hospitalist service, was asked to admit for AMS.  ED Course: Temperature 98.8.  BP 158/80, pulse 81, respiratory 18, O2 saturation 98% on room air.  Review of Systems: Review of systems as noted in the HPI. All other systems reviewed and are negative.   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   Past Surgical History:  Procedure Laterality Date   BRONCHIAL BIOPSY  09/27/2021   Procedure: BRONCHIAL BIOPSIES;  Surgeon: Brenna Adine CROME, DO;   Location: [facility];  Service: Pulmonary;;   BRONCHIAL BRUSHINGS  09/27/2021   Procedure: BRONCHIAL BRUSHINGS;  Surgeon: Brenna Adine CROME, DO;  Location: [facility];  Service: Pulmonary;;   BRONCHIAL NEEDLE ASPIRATION BIOPSY  09/27/2021   Procedure: BRONCHIAL NEEDLE ASPIRATION BIOPSIES;  Surgeon: Brenna Adine CROME, DO;  Location: [facility];  Service: Pulmonary;;   COLONOSCOPY     IR IMAGING GUIDED PORT INSERTION  10/24/2021   left breast surgery Left 1968   VIDEO BRONCHOSCOPY WITH RADIAL ENDOBRONCHIAL ULTRASOUND  09/27/2021   Procedure: RADIAL ENDOBRONCHIAL ULTRASOUND;  Surgeon: Brenna Adine CROME, DO;  Location: [facility];  Service: Pulmonary;;    Social History:  reports that he quit smoking about a year ago. His smoking use included cigarettes. He has a 5 pack-year smoking history. He has never used smokeless tobacco. He reports that he does not currently use alcohol. He reports that he does not use drugs.   No Known Allergies  Family History  Problem Relation Age of Onset   Cancer Mother 56       unknown type cancer   Colon cancer Neg Hx    Esophageal cancer Neg Hx    Rectal cancer Neg Hx    Stomach cancer Neg Hx    Inflammatory bowel disease Neg Hx    Liver disease Neg Hx    Pancreatic cancer Neg Hx       Prior to Admission medications   Medication Sig Start Date End Date Taking? Authorizing Provider  amLODipine  (NORVASC ) 10 MG tablet TAKE 1 TABLET BY MOUTH EVERY DAY 09/18/24   Boscia, Heather E, NP  cabozantinib  (CABOMETYX ) 40 MG tablet Take 1 tablet (  40 mg total) by mouth daily. Take on an empty stomach, 1 hour before or 2 hours after meals. 09/11/24   Lanny Callander, MD  diphenoxylate -atropine  (LOMOTIL ) 2.5-0.025 MG tablet Take 2 tablets by mouth 4 (four) times daily as needed for diarrhea or loose stools. 09/11/24   Lanny Callander, MD  gabapentin  (NEURONTIN ) 100 MG capsule TAKE 2 CAPSULES BY MOUTH 3 TIMES A DAY 09/24/24   Boscia, Heather E, NP  Multiple Vitamin (MULTIVITAMIN  WITH MINERALS) TABS tablet Take 1 tablet by mouth daily.    [provider]  oxyCODONE  (OXY IR/ROXICODONE ) 5 MG immediate release tablet Take 1 tablet (5 mg total) by mouth every 6 (six) hours as needed for severe pain (pain score 7-10) or breakthrough pain. 10/01/24   Pickenpack-Cousar, Athena N, NP  ondansetron  (ZOFRAN ) 8 MG tablet Take 1 tablet (8 mg total) by mouth every 8 (eight) hours as needed for nausea or vomiting. 09/09/21   Lanny Callander, MD    Physical Exam: BP (!) 158/80 (BP Location: Left Arm)   Pulse (!) 51   Temp 98.8 F (37.1 C) (Oral)   Resp 18   SpO2 98%   General: 73 y.o. year-old male well developed well nourished in no acute distress.  Alert and oriented x 2. Cardiovascular: Regular rate and rhythm with no rubs or gallops.  No thyromegaly or JVD noted.  No lower extremity edema. 2/4 pulses in all 4 extremities. Respiratory: Clear to auscultation with no wheezes or rales. Good inspiratory effort. Abdomen: Soft nontender nondistended with normal bowel sounds x4 quadrants. Muskuloskeletal: No cyanosis, clubbing or edema noted bilaterally Neuro: CN II-XII intact, strength, sensation, reflexes Skin: Very dry flaky skin. Psychiatry: Judgement and insight appear mildly altered. Mood is appropriate for condition and setting          Labs on Admission:  Basic Metabolic Panel: Recent Labs  Lab 10/07/24 1107  NA 139  K 4.0  CL 98  CO2 27  GLUCOSE 97  BUN 9  CREATININE 0.79  CALCIUM  9.8   Liver Function Tests: Recent Labs  Lab 10/07/24 1107  AST 87*  ALT 59*  ALKPHOS 192*  BILITOT 0.7  PROT 8.7*  ALBUMIN 3.8   No results for input(s): LIPASE, AMYLASE in the last 168 hours. Recent Labs  Lab 10/07/24 1615  AMMONIA 47*   CBC: Recent Labs  Lab 10/07/24 1107  WBC 4.0  HGB 18.2*  HCT 57.1*  MCV 94.7  PLT 277   Cardiac Enzymes: No results for input(s): CKTOTAL, CKMB, CKMBINDEX, TROPONINI in the last 168 hours.  BNP (last 3  results) Recent Labs    11/24/23 1149 11/25/23 0853  BNP 357.7* 151.7*    ProBNP (last 3 results) No results for input(s): PROBNP in the last 8760 hours.  CBG: No results for input(s): GLUCAP in the last 168 hours.  Radiological Exams on Admission: MR Brain W and Wo Contrast Result Date: 10/07/2024 EXAM: MRI BRAIN WITH AND WITHOUT CONTRAST 10/07/2024 05:06:31 PM TECHNIQUE: Multiplanar multisequence MRI of the head/brain was performed with and without the administration of intravenous contrast. COMPARISON: Head CT 10/07/2024. CLINICAL HISTORY: Mental status change, unknown cause; eval for L parietal stroke and possible brain mets. FINDINGS: BRAIN AND VENTRICLES: No acute or subacute infarct, mass, midline shift, hydrocephalus, or extra axial fluid collection is identified. There are innumerable chronic microhemorrhages throughout both cerebral hemispheres with near complete sparing of the deep gray nuclei and cerebellum. Confluent T2 hyperintensity/mild edema is present in the subcortical white matter  of the left parietal and occipital lobes, also likely with involvement of overlying cortex. Milder patchy subcortical T2 hyperintensities are present elsewhere bilaterally, including in the right occipital and posterior temporal lobes. No definite abnormal enhancement is identified within limitations of motion artifact, although minimal subtle leptomeningeal enhancement in the left parietal region is questioned. Mild cerebral atrophy is within normal limits for age. Major intracranial vascular flow voids are preserved. ORBITS: No acute abnormality. SINUSES: Mild mucosal thickening in the paranasal sinuses. Large bilateral mastoid effusions. BONES AND SOFT TISSUES: Normal bone marrow signal and enhancement. No acute soft tissue abnormality. IMPRESSION: 1. No acute/subacute infarct or definite intracranial metastatic disease on this motion-degraded study. 2. Innumerable chronic cerebral  microhemorrhages suggestive of cerebral amyloid angiopathy. 3. Patchy subcortical T2 hyperintensities bilaterally as well as more confluent edema-type signal in the posterior left cerebral hemisphere, nonspecific but suspicious for inflammatory cerebral amyloid disease. Other considerations include posterior reversible encephalopathy syndrome and encephalitis. Electronically signed by: Dasie Hamburg MD 10/07/2024 07:06 PM EST RP Workstation: [id number]   CT Head Wo Contrast Result Date: 10/07/2024 EXAM: CT HEAD WITHOUT CONTRAST 10/07/2024 02:13:21 PM TECHNIQUE: CT of the head was performed without the administration of intravenous contrast. Automated exposure control, iterative reconstruction, and/or weight based adjustment of the mA/kV was utilized to reduce the radiation dose to as low as reasonably achievable. COMPARISON: CT head 11/24/2023. CLINICAL HISTORY: Head trauma, minor (Age >= 65y). FINDINGS: BRAIN AND VENTRICLES: No acute hemorrhage. There is transcortical hypoattenuation involving the left parietal lobe, new since prior exam. Overall similar background mild scattered white matter hypodensities, which are nonspecific but most commonly represent chronic microvascular ischemic changes. No hydrocephalus. No extra-axial collection. No mass effect or midline shift. ORBITS: No acute abnormality. SINUSES: Moderate left mastoid effusion and left middle ear opacification, increased since prior exam. Increased mild right mastoid effusion. Decreased paranasal sinus inflammatory mucosal thickening SOFT TISSUES AND SKULL: No acute soft tissue abnormality. No skull fracture. IMPRESSION: 1. Since 11/24/2023, new left parietal hypoattenuation may represent recent ischemic infarction. Recommend MRI brain with IV contrast for further evaluation. 2. No acute intracranial hemorrhage. Electronically signed by: prentice spade 10/07/2024 02:55 PM EST RP Workstation: [id number]   CT PELVIS WO CONTRAST Result Date:  10/07/2024 CLINICAL DATA:  Suspect hip fracture. History of metastatic rectal carcinoma. Fall with right hip pain. EXAM: CT PELVIS WITHOUT CONTRAST TECHNIQUE: Multidetector CT imaging of the pelvis was performed following the standard protocol without intravenous contrast. RADIATION DOSE REDUCTION: This exam was performed according to the departmental dose-optimization program which includes automated exposure control, adjustment of the mA and/or kV according to patient size and/or use of iterative reconstruction technique. COMPARISON:  CT 07/01/2024 and plain x-ray right hip 10/07/2024 FINDINGS: Urinary Tract:  No abnormality visualized. Bowel: Possible small bowel loop in the left lower quadrant at the upper limits of normal in diameter. Bowel is otherwise unremarkable. Vascular/Lymphatic: Calcified plaque over the abdominal aorta which is normal caliber. Mild calcified plaque over the external iliac arteries. No adenopathy. Reproductive:  No mass or other significant abnormality Other: No free fluid over the lower abdomen/pelvis. Moderate cholelithiasis present. Musculoskeletal: No acute fracture or dislocation. Minimal degenerative change of the hips. Mild degenerate change of the spine. IMPRESSION: 1. No acute fracture or dislocation. 2. Moderate cholelithiasis. 3. Aortic atherosclerosis. Aortic Atherosclerosis (ICD10-I70.0). Electronically Signed   By: Toribio Agreste M.D.   On: 10/07/2024 14:40   CT Cervical Spine Wo Contrast Result Date: 10/07/2024 EXAM: CT CERVICAL SPINE WITHOUT CONTRAST 10/07/2024 02:13:21 PM  TECHNIQUE: CT of the cervical spine was performed without the administration of intravenous contrast. Multiplanar reformatted images are provided for review. Automated exposure control, iterative reconstruction, and/or weight based adjustment of the mA/kV was utilized to reduce the radiation dose to as low as reasonably achievable. COMPARISON: None available. CLINICAL HISTORY: Neck trauma (Age >=  65y) FINDINGS: CERVICAL SPINE: BONES AND ALIGNMENT: No acute fracture or traumatic malalignment. DEGENERATIVE CHANGES: Early degenerative disc and facet disease bilaterally. SOFT TISSUES: No prevertebral soft tissue swelling. LUNGS: Partially visualized in the anterior right upper lobe of the chest is a 2.2 x 1.4 cm irregular masslike area in the right apex. This was noted on prior chest CT from 07/01/2024 when this area measured 2.5 x 1.9 cm. Recommend continued attention on follow-up chest imaging. IMPRESSION: 1. No acute abnormality of the cervical spine. 2. Partially visualized 2.2 x 1.4 cm irregular mass in the right lung apex, decreased in size from 2.5 x 1.9 cm on 07/01/2024; recommend continued follow-up chest imaging. Electronically signed by: Franky Crease MD 10/07/2024 02:37 PM EST RP Workstation: [id number]   DG Hip Unilat  With Pelvis 2-3 Views Right Result Date: 10/07/2024 EXAM: 2 VIEW(S) XRAY OF THE UNILATERAL HIP 10/07/2024 11:36:00 AM COMPARISON: Pelvic CT of 07/01/2024. CLINICAL HISTORY: Fall/Mobility issues. FINDINGS: BONES AND JOINTS: No acute fracture or focal osseous lesion. Mild bilateral hip joint space narrowing and subchondral sclerosis are mild and relatively symmetric, consistent with mild bilateral hip osteoarthritis. SOFT TISSUES: Presumed vascular calcifications within the pelvis. INCIDENTAL FINDINGS: Gallstones. IMPRESSION: 1. No acute findings. 2. Mild bilateral hip osteoarthritis. 3. Cholelithiasis Electronically signed by: Rockey Kilts MD 10/07/2024 12:05 PM EST RP Workstation: [id number]    EKG: I independently viewed the EKG done and my findings are as followed: None available at the time of this visit.  Assessment/Plan Present on Admission:  AMS (altered mental status)  Principal Problem:   AMS (altered mental status)  Acute metabolic encephalopathy, suspect multifactorial secondary to PRES versus side effect from cancer medications. No evidence of active infective  process UA negative for pyuria, lung auscultation with clear sounds Nonseptic appearing.  Metastatic colon cancer with hepatic involvement, lung metastases Followed by medical oncology, Dr. Ileana Dr. Ileana consulted to determine whether to restart his home cancer medications, Nivolumab  and Cabometyx   Elevated liver chemistries, elevated ammonia level Possible side effect from nivolumab  and Cabometyx  Avoid hepatotoxic agents Trend LFTs  Innumerable chronic cerebral microhemorrhages suggestive of cerebral amyloid angiopathy, seen on brain MRI Currently not on anticoagulation.  Hypertension Resume home oral antihypertensives Closely monitor vital signs.  Generalized weakness PT OT evaluation Fall precautions.  Chronic HFrEF Euvolemic on exam Last 2D echo done on 11/25/2023 revealed LVEF 60 to 65%. Strict I's and O's and daily weight.   Time: 75 minutes.   DVT prophylaxis: SCDs.  Code Status: DNR.  Family Communication: Granddaughter at bedside.  Disposition Plan: Admitted to telemetry unit.  Consults called: Medical oncology.  Admission status: Observation status.   Status is: Observation    Terry LOISE Hurst MD [facility] Pager [phone]  If 7PM-7AM, please contact night-coverage [email or web address] Password [facility]  10/07/2024, 7:45 PM

## 2024-10-07 NOTE — Plan of Care (Signed)
Discuss and review plan of care with patient/family  

## 2024-10-08 ENCOUNTER — Inpatient Hospital Stay: Admitting: Nurse Practitioner

## 2024-10-08 ENCOUNTER — Inpatient Hospital Stay: Admitting: Hematology

## 2024-10-08 ENCOUNTER — Inpatient Hospital Stay: Attending: Physician Assistant

## 2024-10-08 ENCOUNTER — Other Ambulatory Visit: Payer: Self-pay

## 2024-10-08 ENCOUNTER — Inpatient Hospital Stay

## 2024-10-08 DIAGNOSIS — R29898 Other symptoms and signs involving the musculoskeletal system: Secondary | ICD-10-CM | POA: Insufficient documentation

## 2024-10-08 DIAGNOSIS — R9389 Abnormal findings on diagnostic imaging of other specified body structures: Secondary | ICD-10-CM

## 2024-10-08 LAB — URINE CULTURE: Culture: NO GROWTH

## 2024-10-08 MED ORDER — GABAPENTIN 100 MG PO CAPS
200.0000 mg | ORAL_CAPSULE | Freq: Three times a day (TID) | ORAL | Status: DC
  Administered 2024-10-08 – 2024-10-12 (×13): 200 mg via ORAL
  Filled 2024-10-08 (×13): qty 2

## 2024-10-08 MED ORDER — LEVETIRACETAM (KEPPRA) 500 MG/5 ML ADULT IV PUSH
60.0000 mg/kg | Freq: Once | INTRAVENOUS | Status: AC
  Administered 2024-10-08: 3500 mg via INTRAVENOUS
  Filled 2024-10-08: qty 35

## 2024-10-08 MED ORDER — CABOZANTINIB S-MALATE 40 MG PO TABS: 40.0000 mg | ORAL_TABLET | Freq: Every day | ORAL | Status: DC

## 2024-10-08 NOTE — Progress Notes (Addendum)
 Prior-To-Admission Oral Chemotherapy for Treatment of Oncologic Disease   Order noted from Dr. Earley to continue prior-to-admission oral chemotherapy regimen of cabozantinib  (Cabometyx ).  Procedure Per Pharmacy & Therapeutics Committee Policy: Orders for continuation of home oral chemotherapy for treatment of an oncologic disease will be held unless approved by an oncologist during current admission.    For patients receiving oncology care at [facility], inpatient pharmacist contacts patient's oncologist during regular office hours to review. If earlier review is medically necessary, attending physician consults [facility] on-call oncologist   For patients receiving oncology care outside of [facility], attending physician consults patient's oncologist to review. If this oncologist or their coverage cannot be reached, attending physician consults [facility] on-call oncologist  Per Dr Lanny: hold cabozantinib .    Jorge [name], Pharm.D Use secure chat for questions 10/08/2024 11:23 AM

## 2024-10-08 NOTE — Progress Notes (Signed)
 OT Cancellation Note  Patient Details Name: Jorge [name] MRN: [id number] DOB: 03-05-1951   Cancelled Treatment:    Reason Eval/Treat Not Completed: Other (comment) Patient pending transfer to [facility]. OT will hold at this time, and evaluate once transferred.   Ronal Gift E. Hulbert Branscome, OTR/L Acute Rehabilitation Services [phone]   Ronal Gift Salt 10/08/2024, 9:44 AM

## 2024-10-08 NOTE — Progress Notes (Cosign Needed)
 Jorge [name]   DOB:14-May-1951   FM#:[id number]      ASSESSMENT & PLAN:  Jorge [name] is a 73 year old male patient admitted on 10/07/2024 with complaints of altered mental status, near fall and generalized weakness.  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  Medical oncology following.   Altered mental status Generalized weakness - Patient presented to ER on 10/07/2024 complaining of increasing weakness with near fall at home, states ambulation has been difficult.  Also notes speech delay and memory issues.  Patient's nephew states they have noticed the altered mentation for about 2 weeks. - MRI brain 10/07/2024 abnormal, shows no malignancy.  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 -Ammonia level elevated 47 - Neurology will evaluate further at [facility].  Right hip pain - Patient complains of increasing pain in his right hip. -CT done 12/2 showed no acute fracture or dislocation. - Continue supportive care  Rectal cancer with lung mets Transaminitis - Diagnosed 08/2021 - Status post concurrent chemo radiation.  Has been on multiple lines of therapy due to disease progression. - Imaging done 12/2 shows irregular mass right lung which has decreased in size from previous imaging of 07/01/2024. - Has more recently been on cabozantinib  40 mg p.o. daily.  Now on hold - Elevated LFTs - Medical oncology/Dr. Ileana following closely   Code Status DNR-Limited  Subjective:  Patient seen awake and alert laying in bed.  Patient's nephew is at bedside.  Reports ongoing pain right hip.  Denies fall at home.  Noted during assessment that patient has difficulty with speech-word finding.  Admits to memory issues.  Patient is aware of plans to transfer to [facility] and is agreeable.  No other acute distress is noted.  Objective:   Intake/Output Summary (Last 24 hours) at 10/08/2024 1146 Last  data filed at 10/08/2024 9472 Gross per 24 hour  Intake 342.88 ml  Output --  Net 342.88 ml     PHYSICAL EXAMINATION: ECOG PERFORMANCE STATUS: 3 - Symptomatic, >50% confined to bed  Vitals:   10/08/24 0820 10/08/24 0854  BP: (!) 175/108 (!) 161/99  Pulse: 85 92  Resp:  18  Temp: 98.1 F (36.7 C) 99.6 F (37.6 C)  SpO2: 99% 97%   Filed Weights   10/07/24 2036  Weight: 128 lb (58.1 kg)    GENERAL: alert, no distress and comfortable +cachectic  SKIN: skin color, texture, turgor are normal, no rashes or significant lesions EYES: normal, conjunctiva are pink and non-injected, sclera clear OROPHARYNX: no exudate, no erythema and lips, buccal mucosa, and tongue normal  NECK: supple, thyroid  normal size, non-tender, without nodularity LYMPH: no palpable lymphadenopathy in the cervical, axillary or inguinal LUNGS: clear to auscultation and percussion with normal breathing effort HEART: regular rate & rhythm and no murmurs and no lower extremity edema ABDOMEN: abdomen soft, non-tender and normal bowel sounds MUSCULOSKELETAL: no cyanosis of digits and no clubbing  PSYCH: alert & oriented x 3 with +delayed speech NEURO: no focal motor/sensory deficits   All questions were answered. The patient knows to call the clinic with any problems, questions or concerns.   The total time spent in the appointment was 40 minutes encounter with patient including review of chart and various tests results, discussions about plan of care and coordination of care plan  Olam JINNY Brunner, NP 10/08/2024 11:46 AM    Labs Reviewed:  Lab Results  Component Value Date   WBC  4.0 10/07/2024   HGB 18.2 (H) 10/07/2024   HCT 57.1 (H) 10/07/2024   MCV 94.7 10/07/2024   PLT 277 10/07/2024   Recent Labs    11/25/23 0853 11/26/23 0620 08/14/24 1111 09/11/24 1003 10/07/24 1107  NA 132*   < > 137 138 139  K 3.5   < > 4.1 3.6 4.0  CL 96*   < > 103 102 98  CO2 26   < > 31 32 27  GLUCOSE 94   < > 105*  105* 97  BUN 12   < > 8 8 9   CREATININE 0.93   < > 0.68 0.72 0.79  CALCIUM  8.1*   < > 8.8* 8.5* 9.8  GFRNONAA >60   < > >60 >60 >60  PROT 6.1*   < > 6.9 6.8 8.7*  ALBUMIN 2.4*   < > 2.9* 3.0* 3.8  AST 34   < > 40 51* 87*  ALT 27   < > 27 44 59*  ALKPHOS 98   < > 115 135* 192*  BILITOT 1.3*   < > 0.4 0.4 0.7  BILIDIR 0.4*  --   --   --  0.2  IBILI 0.9  --   --   --  0.5   < > = values in this interval not displayed.    Studies Reviewed:  MR Brain W and Wo Contrast Result Date: 10/07/2024 EXAM: MRI BRAIN WITH AND WITHOUT CONTRAST 10/07/2024 05:06:31 PM TECHNIQUE: Multiplanar multisequence MRI of the head/brain was performed with and without the administration of intravenous contrast. COMPARISON: Head CT 10/07/2024. CLINICAL HISTORY: Mental status change, unknown cause; eval for L parietal stroke and possible brain mets. FINDINGS: BRAIN AND VENTRICLES: No acute or subacute infarct, mass, midline shift, hydrocephalus, or extra axial fluid collection is identified. There are innumerable chronic microhemorrhages throughout both cerebral hemispheres with near complete sparing of the deep gray nuclei and cerebellum. Confluent T2 hyperintensity/mild edema is present in the subcortical white matter of the left parietal and occipital lobes, also likely with involvement of overlying cortex. Milder patchy subcortical T2 hyperintensities are present elsewhere bilaterally, including in the right occipital and posterior temporal lobes. No definite abnormal enhancement is identified within limitations of motion artifact, although minimal subtle leptomeningeal enhancement in the left parietal region is questioned. Mild cerebral atrophy is within normal limits for age. Major intracranial vascular flow voids are preserved. ORBITS: No acute abnormality. SINUSES: Mild mucosal thickening in the paranasal sinuses. Large bilateral mastoid effusions. BONES AND SOFT TISSUES: Normal bone marrow signal and enhancement. No  acute soft tissue abnormality. IMPRESSION: 1. No acute/subacute infarct or definite intracranial metastatic disease on this motion-degraded study. 2. Innumerable chronic cerebral microhemorrhages suggestive of cerebral amyloid angiopathy. 3. Patchy subcortical T2 hyperintensities bilaterally as well as more confluent edema-type signal in the posterior left cerebral hemisphere, nonspecific but suspicious for inflammatory cerebral amyloid disease. Other considerations include posterior reversible encephalopathy syndrome and encephalitis. Electronically signed by: Dasie Hamburg MD 10/07/2024 07:06 PM EST RP Workstation: [id number]   CT Head Wo Contrast Result Date: 10/07/2024 EXAM: CT HEAD WITHOUT CONTRAST 10/07/2024 02:13:21 PM TECHNIQUE: CT of the head was performed without the administration of intravenous contrast. Automated exposure control, iterative reconstruction, and/or weight based adjustment of the mA/kV was utilized to reduce the radiation dose to as low as reasonably achievable. COMPARISON: CT head 11/24/2023. CLINICAL HISTORY: Head trauma, minor (Age >= 65y). FINDINGS: BRAIN AND VENTRICLES: No acute hemorrhage. There is transcortical hypoattenuation involving the left parietal  lobe, new since prior exam. Overall similar background mild scattered white matter hypodensities, which are nonspecific but most commonly represent chronic microvascular ischemic changes. No hydrocephalus. No extra-axial collection. No mass effect or midline shift. ORBITS: No acute abnormality. SINUSES: Moderate left mastoid effusion and left middle ear opacification, increased since prior exam. Increased mild right mastoid effusion. Decreased paranasal sinus inflammatory mucosal thickening SOFT TISSUES AND SKULL: No acute soft tissue abnormality. No skull fracture. IMPRESSION: 1. Since 11/24/2023, new left parietal hypoattenuation may represent recent ischemic infarction. Recommend MRI brain with IV contrast for further evaluation.  2. No acute intracranial hemorrhage. Electronically signed by: prentice spade 10/07/2024 02:55 PM EST RP Workstation: [id number]   CT PELVIS WO CONTRAST Result Date: 10/07/2024 CLINICAL DATA:  Suspect hip fracture. History of metastatic rectal carcinoma. Fall with right hip pain. EXAM: CT PELVIS WITHOUT CONTRAST TECHNIQUE: Multidetector CT imaging of the pelvis was performed following the standard protocol without intravenous contrast. RADIATION DOSE REDUCTION: This exam was performed according to the departmental dose-optimization program which includes automated exposure control, adjustment of the mA and/or kV according to patient size and/or use of iterative reconstruction technique. COMPARISON:  CT 07/01/2024 and plain x-ray right hip 10/07/2024 FINDINGS: Urinary Tract:  No abnormality visualized. Bowel: Possible small bowel loop in the left lower quadrant at the upper limits of normal in diameter. Bowel is otherwise unremarkable. Vascular/Lymphatic: Calcified plaque over the abdominal aorta which is normal caliber. Mild calcified plaque over the external iliac arteries. No adenopathy. Reproductive:  No mass or other significant abnormality Other: No free fluid over the lower abdomen/pelvis. Moderate cholelithiasis present. Musculoskeletal: No acute fracture or dislocation. Minimal degenerative change of the hips. Mild degenerate change of the spine. IMPRESSION: 1. No acute fracture or dislocation. 2. Moderate cholelithiasis. 3. Aortic atherosclerosis. Aortic Atherosclerosis (ICD10-I70.0). Electronically Signed   By: Toribio Agreste M.D.   On: 10/07/2024 14:40   CT Cervical Spine Wo Contrast Result Date: 10/07/2024 EXAM: CT CERVICAL SPINE WITHOUT CONTRAST 10/07/2024 02:13:21 PM TECHNIQUE: CT of the cervical spine was performed without the administration of intravenous contrast. Multiplanar reformatted images are provided for review. Automated exposure control, iterative reconstruction, and/or weight based  adjustment of the mA/kV was utilized to reduce the radiation dose to as low as reasonably achievable. COMPARISON: None available. CLINICAL HISTORY: Neck trauma (Age >= 65y) FINDINGS: CERVICAL SPINE: BONES AND ALIGNMENT: No acute fracture or traumatic malalignment. DEGENERATIVE CHANGES: Early degenerative disc and facet disease bilaterally. SOFT TISSUES: No prevertebral soft tissue swelling. LUNGS: Partially visualized in the anterior right upper lobe of the chest is a 2.2 x 1.4 cm irregular masslike area in the right apex. This was noted on prior chest CT from 07/01/2024 when this area measured 2.5 x 1.9 cm. Recommend continued attention on follow-up chest imaging. IMPRESSION: 1. No acute abnormality of the cervical spine. 2. Partially visualized 2.2 x 1.4 cm irregular mass in the right lung apex, decreased in size from 2.5 x 1.9 cm on 07/01/2024; recommend continued follow-up chest imaging. Electronically signed by: Franky Crease MD 10/07/2024 02:37 PM EST RP Workstation: [id number]   DG Hip Unilat  With Pelvis 2-3 Views Right Result Date: 10/07/2024 EXAM: 2 VIEW(S) XRAY OF THE UNILATERAL HIP 10/07/2024 11:36:00 AM COMPARISON: Pelvic CT of 07/01/2024. CLINICAL HISTORY: Fall/Mobility issues. FINDINGS: BONES AND JOINTS: No acute fracture or focal osseous lesion. Mild bilateral hip joint space narrowing and subchondral sclerosis are mild and relatively symmetric, consistent with mild bilateral hip osteoarthritis. SOFT TISSUES: Presumed vascular calcifications within the pelvis.  INCIDENTAL FINDINGS: Gallstones. IMPRESSION: 1. No acute findings. 2. Mild bilateral hip osteoarthritis. 3. Cholelithiasis Electronically signed by: Rockey Kilts MD 10/07/2024 12:05 PM EST RP Workstation: [id number]   Addendum I have seen the patient, examined him. I agree with the assessment and and plan and have edited the notes.   Kolden's slurred speech has improved some.  He has been evaluated by neurology at [facility], currently on  EEG monitoring, and LP is planned tomorrow.  Lab and images reviewed.  If neurology workup negative, he is neurological symptoms and MRI findings could be related to his immunotherapy and cabozantinib , especially cabozantinib , which I have held it. I will f/u as needed in hospital, and see him back after discharge.   Onita Mattock MD 10/09/2024

## 2024-10-08 NOTE — Progress Notes (Signed)
 Pt admitted to rm 32 from [facility]. Initiated tele. Oriented pt to the unit. Obtained vs. Call bell within reach. Bed alarm activated.   Amado GORMAN Arabia, RN

## 2024-10-08 NOTE — Plan of Care (Signed)
 Reviewed MRI Brain w + w/o C. Its a complex study specially with noted T2/FLAIR changes and edema in the left posterior hemisphere. Differrential includes inflammatory amyloid specially given noted numerous microhemorrhages, PRES, paraneoplastic encephalitis, checkpoint inhibitor related neurotoxicity. Patient has known hx of metastatic rectal adenocarcinoma and is undergoing treatment with chemo.  Given the complexity, he will be better served at [facility] rather than at [facility]. I discussed this with Dr. Earley over secure chat. We will see him when he gets here.  Mayerli Kirst [facility]

## 2024-10-08 NOTE — Progress Notes (Signed)
 [facility] Progress Note  Patient: Jorge [name]     FMW:[id number]  DOA: 10/07/2024   PCP: Petrina Pries, NP       Brief hospital course: This is a 73 year old male with metastatic colon cancer on chemotherapy, hypertension, chronic HFpEF, smoker who presented to the hospital for right-sided hip pain, trouble walking and numerous falls over the past several days.  His granddaughter who came to the hospital with him also noted that he had been more confused and having trouble remembering things. In the ED he was found to have 4/5 weakness in the right lower extremity.  Further workup involved with CT of the head which revealed a new left parietal hypoattenuation. MRI of the brain was performed which revealed: Patchy subcortical hyperintensities bilaterally as well as more confluent edema type signal in the posterior left cerebral hemisphere, innumerable chronic cerebral microhemorrhages suggestive of cerebral amyloid angiopathy CT of the pelvis was also performed which was unremarkable CT of the C-spine revealed an irregular mass in the right lung apex which was partially visualized but decreased in size from prior imaging on 07/01/2024.   Subjective:  On my exam, the patient appears to be having word finding issues and is generally a poor historian.  He does state that he came in because of right lower back pain and right leg weakness.  Currently he has no pain in his right lower back.  His grandson who is at the bedside states that the family was concerned about his frequent falls.  He does not feel that the patient had any confusion.  Assessment and Plan: Principal Problem:   Abnormal MRI-right leg weakness and falls at home - On exam, the patient appears to have significant word finding difficulties and trouble giving a history-he is able to tell me the month the year and the fact that he is in the hospital and is able to recognize his grandson who is at the bedside - He also  appears to have weakness when attempting to elevate his right leg off the bed-this is not appear to be secondary to any sort of pain - Have spoken with neurology (Dr Vanessa) regarding his abnormal MRI findings who recommend that he be transferred to [facility] for further evaluation- they have also recommended to hold off on anticoagulants - PT eval requested  Active Problems:  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 Ileana with cabozantinib  which I have requested to be resumed -last oncology eval was on 09/11/2024 and restaging CT was planned in 3 weeks  Right hip pain? -Patient points to his right flank when describing the area that was painful-currently not painful -In the ED, he stated that he had right hip pain and therefore CT of the pelvis was performed which was unremarkable  Hypertension - Continue amlodipine   Peripheral neuropathy - Continue gabapentin         Code Status: Limited: Do not attempt resuscitation (DNR) -DNR-LIMITED -Do Not Intubate/DNI  Total time on patient care: 40 minutes DVT prophylaxis:  SCDs Start: 10/07/24 1945     Objective:   Vitals:   10/08/24 0107 10/08/24 0546 10/08/24 0820 10/08/24 0854  BP: (!) 169/104 (!) 173/86 (!) 175/108 (!) 161/99  Pulse: 86 82 85 92  Resp: 18 18  18   Temp: 97.6 F (36.4 C) 98 F (36.7 C) 98.1 F (36.7 C) 99.6 F (37.6 C)  TempSrc: Oral Oral  Oral Oral  SpO2: 99% 98% 99% 97%  Weight:      Height:       Filed Weights   10/07/24 2036  Weight: 58.1 kg   Exam: General exam: Appears comfortable  HEENT: oral mucosa moist Respiratory system: Clear to auscultation.  Cardiovascular system: S1 & S2 heard  Gastrointestinal system: Abdomen soft, non-tender, nondistended. Normal bowel sounds   Neuro: Cranial nerves II through XII intact, 4/5 weakness at left hip, word finding  difficulties   Extremities: No cyanosis, clubbing or edema Psychiatry:  Mood & affect appropriate.    CBC: Recent Labs  Lab 10/07/24 1107  WBC 4.0  HGB 18.2*  HCT 57.1*  MCV 94.7  PLT 277   Basic Metabolic Panel: Recent Labs  Lab 10/07/24 1107  NA 139  K 4.0  CL 98  CO2 27  GLUCOSE 97  BUN 9  CREATININE 0.79  CALCIUM  9.8     Scheduled Meds:  amLODipine   10 mg Oral Daily   Chlorhexidine  Gluconate Cloth  6 each Topical Daily   multivitamin with minerals  1 tablet Oral Daily   sodium chloride  flush  10-40 mL Intracatheter Q12H    Imaging and lab data personally reviewed   Author: [employer]  10/08/2024 11:09 AM  To contact [facility]>   Check the care team in [facility] and look for the attending/consulting [facility] provider listed  Log into [email or web address] and use [place]'s universal password   Go to> [facility]  and find provider  If you still have difficulty reaching the provider, please page the [facility] (Director on Call) for the Hospitalists listed on amion

## 2024-10-08 NOTE — Progress Notes (Addendum)
 Grandson resistant to patient using assitive devices to ambulate. Patient proceeded to walk to bathroom without repositioning IV. Patient almost pulled IV out of arm. Explained to patient's grandson that patient is considered High Fall Risk   1115: Second attempt on re education to grandson about fall precautions and calling for staff prior to letting grandfather get up on his own. Bed at lowest position, call bell within reach, safety bundle in place, nonskid socks in place. Will continue to address and reeducate family on safety of patient. 1330: Patient's bed available-awaiting cleaning. [facility] transport set up for patient and paperwork printed. MD Notified. Report given to RN [facility]W [facility].  1535: [facility] at bedside to [facility]

## 2024-10-08 NOTE — Progress Notes (Signed)
 PT Cancellation Note  Patient Details Name: AFNAN EMBERTON MRN: [id number] DOB: 1951-09-30   Cancelled Treatment:    Reason Eval/Treat Not Completed: Other (comment); plans for transfer to [facility].  Will follow up for PT evaluation once stable after transfer.    Montie Portal 10/08/2024, 9:53 AM Micheline Portal, PT Acute Rehabilitation Services [phone] 10/08/2024

## 2024-10-08 NOTE — Consult Note (Signed)
 NEUROLOGY CONSULT NOTE   Date of service: October 08, 2024 Patient Name: Jorge [name] MRN:  [id number] DOB:  1951-03-26 Chief Complaint: R leg pain, spasms, word finding difficulty Requesting Provider: Earley Saucer, MD  History of Present Illness  Jorge [name] is a 73 y.o. 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  History is mostly provided by granddaughter at the bedside.   He was initially brought into [facility] where he had CT head without contrast which was concerning for a left parietal cortical infarct that appeared to be age-indeterminate.  The case was initially discussed with me and I felt that the infarct location, will not explain his right leg weakness.  I did however recommend MRI of the brain with and without contrast given his history of cancer with concern for met along with hypercoagulability secondary to cancer specifically adenocarcinomas.  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  I recommended the patient be transferred to [facility] for further workup and evaluation.  Patient continues to have significant word finding difficulties and seems frustrated.  ROS   Unable to ascertain due to aphasia.  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    Past Surgical History:  Procedure Laterality Date   BRONCHIAL BIOPSY  09/27/2021   Procedure: BRONCHIAL BIOPSIES;  Surgeon: Brenna Adine CROME, DO;  Location: [facility];  Service: Pulmonary;;    BRONCHIAL BRUSHINGS  09/27/2021   Procedure: BRONCHIAL BRUSHINGS;  Surgeon: Brenna Adine CROME, DO;  Location: [facility];  Service: Pulmonary;;   BRONCHIAL NEEDLE ASPIRATION BIOPSY  09/27/2021   Procedure: BRONCHIAL NEEDLE ASPIRATION BIOPSIES;  Surgeon: Brenna Adine CROME, DO;  Location: [facility];  Service: Pulmonary;;   COLONOSCOPY     IR IMAGING GUIDED PORT INSERTION  10/24/2021   left breast surgery Left 1968   VIDEO BRONCHOSCOPY WITH RADIAL ENDOBRONCHIAL ULTRASOUND  09/27/2021   Procedure: RADIAL ENDOBRONCHIAL ULTRASOUND;  Surgeon: Brenna Adine CROME, DO;  Location: [facility];  Service: Pulmonary;;    Family History: Family History  Problem Relation Age of Onset   Cancer Mother 57       unknown type cancer   Colon cancer Neg Hx    Esophageal cancer Neg Hx    Rectal cancer Neg Hx    Stomach cancer Neg Hx    Inflammatory bowel disease Neg Hx    Liver disease Neg Hx    Pancreatic cancer Neg Hx     Social History  reports that he quit smoking about a year ago. His smoking use included cigarettes. He has a 5 pack-year smoking history. He has never used smokeless tobacco. He reports that he does not currently use alcohol. He reports that he does not use drugs.  No Known Allergies  Medications   Current Facility-Administered Medications:    acetaminophen  (TYLENOL ) tablet 500 mg, 500 mg, Oral, Q6H PRN, Shona, Carole N, DO   amLODipine  (NORVASC ) tablet 10 mg, 10 mg, Oral, Daily, Shona Laurence N, DO, 10 mg at 10/08/24 [phone]  Chlorhexidine  Gluconate Cloth 2 % PADS 6 each, 6 each, Topical, Daily, Shona Terry SAILOR, DO, 6 each at 10/08/24 9192   gabapentin  (NEURONTIN ) capsule 200 mg, 200 mg, Oral, TID, Rizwan, Saima, MD, 200 mg at 10/08/24 1724   lactated ringers  bolus 1,000 mL, 1,000 mL, Intravenous, Once, Rogelia Jerilynn RAMAN, MD   levETIRAcetam (KEPPRA) undiluted injection 3,500 mg, 60 mg/kg, Intravenous, Once, Judithe, Erin C, NP   melatonin tablet 5 mg, 5 mg, Oral, QHS PRN, Shona Terry SAILOR, DO   multivitamin with minerals tablet 1 tablet, 1 tablet, Oral, Daily, Shona Terry N, DO, 1 tablet at 10/08/24 0807   polyethylene glycol (MIRALAX  / GLYCOLAX ) packet 17 g, 17 g, Oral, Daily PRN, Shona, Carole N, DO   prochlorperazine  (COMPAZINE ) injection 5 mg, 5 mg, Intravenous, Q6H PRN, Hall, Carole N, DO   sodium chloride  flush (NS) 0.9 % injection 10-40 mL, 10-40 mL, Intracatheter, Q12H, Hall, Carole N, DO, 10 mL at 10/08/24 0807   sodium chloride  flush (NS) 0.9 % injection 10-40 mL, 10-40 mL, Intracatheter, PRN, Shona Terry SAILOR, DO  Vitals   Vitals:   10/08/24 0820 10/08/24 0854 10/08/24 1413 10/08/24 1631  BP: (!) 175/108 (!) 161/99 (!) 159/94 (!) 141/87  Pulse: 85 92 80 93  Resp:  18 17   Temp: 98.1 F (36.7 C) 99.6 F (37.6 C) (!) 97.3 F (36.3 C) 97.7 F (36.5 C)  TempSrc: Oral Oral Oral Oral  SpO2: 99% 97% 96% 98%  Weight:      Height:        Body mass index is 17.36 kg/m.   Physical Exam   General: Laying comfortably in bed; in no acute distress.  HENT: Normal oropharynx and mucosa. Normal external appearance of ears and nose.  Neck: Supple, no pain or tenderness  CV: No JVD. No peripheral edema.  Pulmonary: Symmetric Chest rise. Normal respiratory effort.  Abdomen: Soft to touch, non-tender.  Ext: No cyanosis, edema, or deformity  Skin: No rash. Normal palpation of skin.   Musculoskeletal: Normal digits and nails by inspection. No clubbing.   Neurologic Examination  Mental status/Cognition: Alert, oriented to self, place, month but not year, good attention. Speech/language: non fluent and gets stuck on several words, comprehension intact to simple but not complex commands, name most but not all objects, repetition intact. Cranial nerves:   CN II Pupils equal and reactive to light, no VF deficits    CN III,IV,VI EOM intact, no gaze preference or deviation, no nystagmus    CN V normal sensation in V1, V2, and V3 segments bilaterally    CN VII no  asymmetry, no nasolabial fold flattening    CN VIII normal hearing to speech    CN IX & X normal palatal elevation, no uvular deviation    CN XI 5/5 head turn and 5/5 shoulder shrug bilaterally    CN XII midline tongue protrusion    Motor:  Muscle bulk: poor, tone normal Mvmt Root Nerve  Muscle Right Left Comments  SA C5/6 Ax Deltoid 5 5   EF C5/6 Mc Biceps 5 5   EE C6/7/8 Rad Triceps 5 5   WF C6/7 Med FCR     WE C7/8 PIN ECU     F Ab C8/T1 U ADM/FDI 5 5   HF L1/2/3 Fem Illopsoas 4- 5 R quadriceps spasms noted.  KE L2/3/4 Fem Quad     DF L4/5 D Peron Tib Ant 5 5   PF S1/2 Tibial Grc/Sol 5  5    Sensation:  Light touch Intact throughout   Pin prick    Temperature    Vibration   Proprioception    Coordination/Complex Motor:  - Finger to Nose intact BL - Heel to shin intact on the left, unable to do with RLE - Rapid alternating movement are intact BL - Gait: deferred.  Labs/Imaging/Neurodiagnostic studies   CBC:  Recent Labs  Lab 10/19/24 1107  WBC 4.0  HGB 18.2*  HCT 57.1*  MCV 94.7  PLT 277   Basic Metabolic Panel:  Lab Results  Component Value Date   NA 139 10/19/24   K 4.0 19-Oct-2024   CO2 27 10-19-24   GLUCOSE 97 October 19, 2024   BUN 9 19-Oct-2024   CREATININE 0.79 2024/10/19   CALCIUM  9.8 October 19, 2024   GFRNONAA >60 10-19-2024   Lipid Panel: No results found for: LDLCALC HgbA1c: No results found for: HGBA1C Urine Drug Screen: No results found for: LABOPIA, COCAINSCRNUR, LABBENZ, AMPHETMU, THCU, LABBARB  Alcohol Level No results found for: [facility] INR  Lab Results  Component Value Date   INR 1.5 (H) 11/25/2023   APTT  Lab Results  Component Value Date   APTT 33 11/25/2023   AED levels: No results found for: PHENYTOIN, ZONISAMIDE, LAMOTRIGINE, LEVETIRACETA  CT Head without contrast(Personally reviewed): 1. Since 11/24/2023, new left parietal hypoattenuation may represent recent ischemic infarction. Recommend MRI brain with  IV contrast for further evaluation. 2. No acute intracranial hemorrhage.  MRI Brain(Personally reviewed): 1. No acute/subacute infarct or definite intracranial metastatic disease on this motion-degraded study. 2. Innumerable chronic cerebral microhemorrhages suggestive of cerebral amyloid angiopathy. 3. Patchy subcortical T2 hyperintensities bilaterally as well as more confluent edema-type signal in the posterior left cerebral hemisphere, nonspecific but suspicious for inflammatory cerebral amyloid disease. Other considerations include posterior reversible encephalopathy syndrome and encephalitis.  Neurodiagnostics cEEG:  pending  ASSESSMENT   EMERY DUPUY is a 73 y.o. 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  MRI Brain with patchy T2/FLAIR hyperintensities bilaterally as well as more confluent edema-type signal in the posterior left cerebral hemisphere, nonspecific but suspicious for inflammatory cerebral amyloid disease. Other differential includes PRES, paraneoplastic encephalitis, checkpoint inhibitor induced neurotoxicity/encephalitis.  Suspect aphasia is likely secondary to noted edema in the left parietal lobe. intermittent RLE twitching and weakness possibly ?focal seizures.  RECOMMENDATIONS  - LP tomorrow. Csf cell count + diff x 2, Csf protein, glucose, meningitis/encephalitis panel, CSF and serum paraneoplastic panel. - cEEG in AM - load with Keppra 60mg /Kg Iv once tonight. - further treatment pending above. - We will continue to follow along. ______________________________________________________________________  Plan discussed with patient and family at the bedside.  I personally spent a total of 80 minutes in the care of the patient today including preparing to  see the patient, getting/reviewing separately obtained history, performing a medically appropriate exam/evaluation, counseling and educating, placing orders, referring and communicating with other health care professionals, documenting clinical information in the EHR, independently interpreting results, communicating results, and coordinating care.   Signed, Burnett Spray, MD [facility]

## 2024-10-08 NOTE — Progress Notes (Signed)
   10/08/24 1329  TOC Brief Assessment  Insurance and Status Reviewed  Patient has primary care physician Yes Tilford, Pat, NP)  Home environment has been reviewed Home  Prior level of function: Independent  Prior/Current Home Services No current home services  Social Drivers of Health Review SDOH reviewed no interventions necessary  Readmission risk has been reviewed Yes  Transition of care needs no transition of care needs at this time

## 2024-10-08 NOTE — Plan of Care (Signed)
   Problem: Activity: Goal: Risk for activity intolerance will decrease Outcome: Progressing   Problem: Pain Managment: Goal: General experience of comfort will improve and/or be controlled Outcome: Progressing   Problem: Safety: Goal: Ability to remain free from injury will improve Outcome: Progressing

## 2024-10-08 NOTE — Care Management Obs Status (Addendum)
 MEDICARE OBSERVATION STATUS NOTIFICATION   Patient Details  Name: Jorge [name] MRN: [id number] Date of Birth: 03-09-1951   Medicare Observation Status Notification Given:    CM at bedside to explain observation notice. Patient is sound asleep and unable to arouse. Grandson at bedside states that his mother would be the next of kin to sign. Unable to reach daughter Devere [phone]. Will attempt to call back.    Toy LITTIE Agar, RN 10/08/2024, 2:31 PM

## 2024-10-09 ENCOUNTER — Observation Stay (HOSPITAL_COMMUNITY)

## 2024-10-09 DIAGNOSIS — R9389 Abnormal findings on diagnostic imaging of other specified body structures: Secondary | ICD-10-CM

## 2024-10-09 DIAGNOSIS — R4182 Altered mental status, unspecified: Secondary | ICD-10-CM | POA: Diagnosis present

## 2024-10-09 LAB — PROTIME-INR
INR: 1.1 (ref 0.8–1.2)
Prothrombin Time: 15.3 s — ABNORMAL HIGH (ref 11.4–15.2)

## 2024-10-09 LAB — APTT: aPTT: 30 s (ref 24–36)

## 2024-10-09 MED ORDER — LOPERAMIDE HCL 2 MG PO CAPS
2.0000 mg | ORAL_CAPSULE | ORAL | Status: DC | PRN
  Administered 2024-10-09 – 2024-10-11 (×6): 2 mg via ORAL
  Filled 2024-10-09 (×6): qty 1

## 2024-10-09 NOTE — Progress Notes (Signed)
 LTM recording with CT compatible leads. [facility] monitoring.

## 2024-10-09 NOTE — Procedures (Signed)
 Procedure Note: Lumbar puncture  Indications: assess for CNS pathology   Operator: Cortney de la Abbey, NP  Others present:   Indications, risks, and benefits explained to patient / surrogate decision maker and informed consent obtained. Time-out was performed, with all individuals present agreeing on the procedure to be performed, the site of procedure, and the patient identity.  Patient positioned, prepped and draped in usual sterile fashion. L3-4 space located using bilateral iliac crests as landmarks. 1% Lidocaine  without epinephrine was used to anesthetize the area.  Attempted to introduce a 20-gauge spinal needle into the sub- arachnoid space but was unable to access the space.  Will attempt procedure under fluoroscopy.  Blood loss was minimal. A dry guaze dressing was placed over insertion site. Patient tolerated the procedure well and no complications were observed. Spontaneous movement of bilateral extremities were observed after the procedure.   Total fluid removed: 0 mL  Cortney E Everitt Clint Abbey , MSN, AGACNP-BC [facility] See Amion for schedule and pager information 10/09/2024 2:53 PM

## 2024-10-09 NOTE — Evaluation (Addendum)
 Occupational Therapy Evaluation Patient Details Name: Jorge [name] MRN: [id number] DOB: 1951/08/13 Today's Date: 10/09/2024   History of Present Illness   Pt is a 73 y/o M who presented to [facility] for R sided-hip pain, difficulty walking, and several falls recently. Pt also noted to be confused on arrival. [facility] Dt revealed new L parietal hypoattenuation, MRI brain negative for acute infarct. CT pelvis unremarkable, CT c-spine revealed irregular mass in the R lung apex, negative for acute fractures. Pt transferred from [facility] to [facility] for further neurology workup. PMHx: metastatic colon CA on chemotherapy, HTN, HFpEF, smoker.     Clinical Impressions Pt greeted sitting EOB, eating lunch independently. Supportive granddaughter in room. PTA, pt was indep with ADLs and mobility without AD. Family drives; lives with his daughter and 3 grandchildren. Pt presents today with functional deficits related to balance and cognition. He was no more than CGA for functional transfers/mobility in room (limited distance 2/2 cEEG cables). Occasional cognitive/sequencing cues for toileting and grooming tasks. He scored 10/28 on Short Blessed Test, indicating mild cognitive impairment. Educated granddaughter and pt on having assist with IADLs including medication mgmt.   Pt is currently functioning below baseline and would benefit from ongoing acute OT services to progress towards safe discharge and to facilitate return to prior level of function. Current recommendation is home with assist as detailed below.     If plan is discharge home, recommend the following:   A little help with bathing/dressing/bathroom;Assistance with cooking/housework;Direct supervision/assist for medications management;Direct supervision/assist for financial management;Supervision due to cognitive status;Assist for transportation     Functional Status Assessment   Patient has had a recent decline in their functional status and demonstrates the  ability to make significant improvements in function in a reasonable and predictable amount of time.     Equipment Recommendations   None recommended by OT     Recommendations for Other Services         Precautions/Restrictions   Precautions Precautions: Fall Recall of Precautions/Restrictions: Intact Restrictions Weight Bearing Restrictions Per Provider Order: No     Mobility Bed Mobility Overal bed mobility: Needs Assistance Bed Mobility: Supine to Sit     Supine to sit: Supervision     General bed mobility comments: exited to the R side    Transfers Overall transfer level: Needs assistance Equipment used: None Transfers: Sit to/from Stand Sit to Stand: Contact guard assist           General transfer comment: Stood from bed height, CGA for safety      Balance Overall balance assessment: Needs assistance Sitting-balance support: Single extremity supported, No upper extremity supported, Feet supported Sitting balance-Leahy Scale: Good Sitting balance - Comments: sitting EOB   Standing balance support: No upper extremity supported, During functional activity Standing balance-Leahy Scale: Fair Standing balance comment: CGA for safety/stability, occasionally reaches for items in room to steady self                           ADL either performed or assessed with clinical judgement   ADL Overall ADL's : Needs assistance/impaired Eating/Feeding: Independent Eating/Feeding Details (indicate cue type and reason): observation eating lunch upon OT arrival Grooming: Supervision/safety;Standing;Wash/dry hands Grooming Details (indicate cue type and reason): cued for thoroughness with hand hygiene             Lower Body Dressing: Contact guard assist;Sitting/lateral leans   Toilet Transfer: Contact guard assist   Toileting- Clothing Manipulation and  Hygiene: Set up;Supervision/safety;Sitting/lateral lean;Sit to/from stand Toileting -  Clothing Manipulation Details (indicate cue type and reason): pt urgently with need to use the bathroom, sits to perform posterior peri hygiene     Functional mobility during ADLs: Supervision/safety;Contact guard assist       Vision Baseline Vision/History: 0 No visual deficits Ability to See in Adequate Light: 0 Adequate Patient Visual Report: No change from baseline Vision Assessment?: No apparent visual deficits     Perception         Praxis         Pertinent Vitals/Pain Pain Assessment Pain Assessment: No/denies pain     Extremity/Trunk Assessment         Cervical / Trunk Assessment Cervical / Trunk Assessment: Normal   Communication Communication Communication: No apparent difficulties   Cognition Arousal: Alert Behavior During Therapy: WFL for tasks assessed/performed Cognition: Cognition impaired   Orientation impairments: Situation (re-oriented appropriately) Awareness: Online awareness impaired Memory impairment (select all impairments): Short-term memory, Working memory Attention impairment (select first level of impairment): Selective attention Executive functioning impairment (select all impairments): Organization, Sequencing OT - Cognition Comments: granddaughter reporting cognition is improving since initially admitted to hospital                 Following commands: Intact       Cueing  General Comments   Cueing Techniques: Verbal cues;Gestural cues      Exercises     Shoulder Instructions      Home Living Family/patient expects to be discharged to:: Private residence Living Arrangements: Children Available Help at Discharge: Family;Available PRN/intermittently Type of Home: Apartment Home Access: Stairs to enter Entrance Stairs-Number of Steps: 3 Entrance Stairs-Rails: Can reach both Home Layout: One level     Bathroom Shower/Tub: Producer, Television/film/video: Standard     Home Equipment: Engineer, Materials -  single point          Prior Functioning/Environment Prior Level of Function : Independent/Modified Independent (dtr drives, pt denies falls despite chart indicating several falls recently)             Mobility Comments: no AD PTA ADLs Comments: indep, reports he manages his own medications; family drives    OT Problem List: Decreased strength;Impaired balance (sitting and/or standing);Decreased cognition   OT Treatment/Interventions: Self-care/ADL training;Therapeutic exercise;DME and/or AE instruction;Therapeutic activities;Cognitive remediation/compensation;Patient/family education;Balance training      OT Goals(Current goals can be found in the care plan section)   Acute Rehab OT Goals OT Goal Formulation: With patient/family Time For Goal Achievement: 10/23/24 Potential to Achieve Goals: Good   OT Frequency:  Min 2X/week    Co-evaluation              AM-PAC OT 6 Clicks Daily Activity     Outcome Measure Help from another person eating meals?: None Help from another person taking care of personal grooming?: A Little Help from another person toileting, which includes using toliet, bedpan, or urinal?: A Little Help from another person bathing (including washing, rinsing, drying)?: A Little Help from another person to put on and taking off regular upper body clothing?: None Help from another person to put on and taking off regular lower body clothing?: A Little 6 Click Score: 20   End of Session Equipment Utilized During Treatment: Gait belt Nurse Communication: Mobility status  Activity Tolerance: Patient tolerated treatment well Patient left: with call bell/phone within reach;with family/visitor present (seated EOB)  OT Visit Diagnosis: Unsteadiness on feet (R26.81);History of  falling (Z91.81);Other symptoms and signs involving cognitive function                Time: 8851-8784 OT Time Calculation (min): 27 min Charges:  OT General Charges $OT Visit: 1  Visit OT Evaluation $OT Eval Low Complexity: 1 Low  Jj Enyeart M. [place], OTR/L [facility] Acute Rehabilitation Services [phone] Secure Chat Preferred  Pranay Hilbun 10/09/2024, 4:19 PM

## 2024-10-09 NOTE — Progress Notes (Signed)
 PROGRESS NOTE    Jorge [name]  FMW:[id number] DOB: 1950-12-13 DOA: 10/07/2024 PCP: Jorge Pries, NP   Brief Narrative:  This is a 73 year old male with metastatic colon cancer on chemotherapy, hypertension, chronic HFpEF, smoker who presented to the hospital for right-sided hip pain, trouble walking and numerous falls over the past several days.  Also noted to be more confused and having trouble remembering things. In the ED he was found to have 4/5 weakness in the right lower extremity.  Further workup involved with CT of the head which revealed a new left parietal hypoattenuation. MRI of the brain was performed which revealed: Patchy subcortical hyperintensities bilaterally as well as more confluent edema type signal in the posterior left cerebral hemisphere, innumerable chronic cerebral microhemorrhages suggestive of cerebral amyloid angiopathy CT of the pelvis was also performed which was unremarkable CT of the C-spine revealed an irregular mass in the right lung apex which was partially visualized but decreased in size from prior imaging on 07/01/2024.  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  Per neurology, other differential includes PRES, paraneoplastic encephalitis, checkpoint inhibitor induced neurotoxicity/encephalitis.  Transferred to [facility], loaded with Keppra, plan for continuous EEG and LP.  Management per neurology and we appreciate their help.     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 Restaging CT on 07/01/2024 showed disease progression -Currently being managed by Dr. Ileana with cabozantinib .  Oncology following.   Right hip pain? CT of the pelvis was performed which was unremarkable, PT OT on board.   Hypertension Blood pressure controlled.  Continue amlodipine    Peripheral neuropathy - Continue gabapentin   Chronic diarrhea: Patient has chronic diarrhea and takes Imodium at home.  No indication to check C. difficile as patient has no abdominal pain, tenderness or fever or leukocytosis.  Ordered Imodium as needed.  DVT prophylaxis: SCDs Start: 10/07/24 1945   Code Status: Limited: Do not attempt resuscitation (DNR) -DNR-LIMITED -Do Not Intubate/DNI   Family Communication: Granddaughter present at bedside.  Plan of care discussed with patient in length and he/she verbalized understanding and agreed with it.  Status is: Observation The patient will require care spanning > 2 midnights and should be moved to inpatient because: Needs more workup by neurology   Estimated body mass index is 18.24 kg/m as calculated from the following:   Height as of this encounter: 6' (1.829 m).   Weight as of this encounter: 61 kg.    Nutritional Assessment: Body mass index is 18.24 kg/m.Jorge [name] Seen by dietician.  I agree with the assessment and plan as outlined below: Nutrition Status:        . Skin Assessment: I have examined the patient's skin and I agree with the wound assessment as performed by the wound care RN as outlined below:    Consultants:  Neurology and oncology  Procedures:  As above  Antimicrobials:  Anti-infectives (From admission, onward)    None         Subjective: Patient seen and examined, granddaughter  at the bedside.  Patient has no complaints at all.  He is fully alert and oriented.  Objective: Vitals:   10/09/24 0040 10/09/24 0450 10/09/24 0451 10/09/24 0749   BP: (!) 153/84 (!) 153/93  (!) 140/105  Pulse: 77 83  88  Resp:  (!) 21  16  Temp: (!) 97.5 F (36.4 C) (!) 97.5 F (36.4 C)  97.6 F (36.4 C)  TempSrc: Oral Oral  Oral  SpO2: 98% 98%  98%  Weight:   61 kg   Height:       No intake or output data in the 24 hours ending 10/09/24 0759 Filed Weights   10/07/24 2036 10/09/24 0451  Weight: 58.1 kg 61 kg    Examination:  General exam: Appears calm and comfortable  Respiratory system: Clear to auscultation. Respiratory effort normal. Cardiovascular system: S1 & S2 heard, RRR. No JVD, murmurs, rubs, gallops or clicks. No pedal edema. Gastrointestinal system: Abdomen is nondistended, soft and nontender. No organomegaly or masses felt. Normal bowel sounds heard. Central nervous system: Alert and oriented. No focal neurological deficits. Extremities: Symmetric 5 x 5 power. Skin: No rashes, lesions or ulcers Psychiatry: Judgement and insight appear normal. Mood & affect appropriate.    Data Reviewed: I have personally reviewed following labs and imaging studies  CBC: Recent Labs  Lab 10/07/24 1107  WBC 4.0  HGB 18.2*  HCT 57.1*  MCV 94.7  PLT 277   Basic Metabolic Panel: Recent Labs  Lab 10/07/24 1107  NA 139  K 4.0  CL 98  CO2 27  GLUCOSE 97  BUN 9  CREATININE 0.79  CALCIUM  9.8   GFR: Estimated Creatinine Clearance: 71 mL/min (by C-G formula based on SCr of 0.79 mg/dL). Liver Function Tests: Recent Labs  Lab 10/07/24 1107  AST 87*  ALT 59*  ALKPHOS 192*  BILITOT 0.7  PROT 8.7*  ALBUMIN 3.8   No results for input(s): LIPASE, AMYLASE in the last 168 hours. Recent Labs  Lab 10/07/24 1615  AMMONIA 47*   Coagulation Profile: Recent Labs  Lab 10/09/24 0500  INR 1.1   Cardiac Enzymes: No results for input(s): CKTOTAL, CKMB, CKMBINDEX, TROPONINI in the last 168 hours. BNP (last 3 results) No results for input(s): PROBNP in the last 8760 hours. HbA1C: No results for input(s): HGBA1C  in the last 72 hours. CBG: No results for input(s): GLUCAP in the last 168 hours. Lipid Profile: No results for input(s): CHOL, HDL, LDLCALC, TRIG, CHOLHDL, LDLDIRECT in the last 72 hours. Thyroid  Function Tests: Recent Labs    10/07/24 2108  TSH 6.230*  FREET4 0.78   Anemia Panel: No results for input(s): VITAMINB12, FOLATE, FERRITIN, TIBC, IRON, RETICCTPCT in the last 72 hours. Sepsis Labs: No results for input(s): PROCALCITON, LATICACIDVEN in the last 168 hours.  Recent Results (from the past 240 hours)  Urine Culture     Status: None   Collection Time: 10/07/24  1:03 PM   Specimen: Urine, Clean Catch  Result Value Ref Range Status   Specimen Description   Final    URINE, CLEAN CATCH Performed at [facility], [address], [place], [place] [address]    Special Requests   Final    NONE Performed at [facility], [address] [place], [place] [address]    Culture   Final    NO GROWTH Performed at [facility], [address], [place], [place] [address]    Report Status 10/08/2024 FINAL  Final     Radiology Studies: MR Brain W and Wo Contrast Result Date: 10/07/2024 EXAM: MRI BRAIN WITH AND WITHOUT CONTRAST 10/07/2024 05:06:31 PM TECHNIQUE: Multiplanar multisequence MRI of the head/brain was performed with and without the administration of intravenous contrast. COMPARISON: Head CT 10/07/2024. CLINICAL HISTORY: Mental status change, unknown cause; eval for L parietal stroke and possible brain mets. FINDINGS: BRAIN AND VENTRICLES: No acute or subacute infarct, mass, midline shift, hydrocephalus, or extra axial fluid collection is identified. There are innumerable chronic microhemorrhages throughout both cerebral hemispheres with near complete sparing of the deep gray nuclei and cerebellum. Confluent T2 hyperintensity/mild edema is present in the subcortical white matter of the left parietal and  occipital lobes, also likely with involvement of overlying cortex. Milder patchy subcortical T2 hyperintensities are present elsewhere bilaterally, including in the right occipital and posterior temporal lobes. No definite abnormal enhancement is identified within limitations of motion artifact, although minimal subtle leptomeningeal enhancement in the left parietal region is questioned. Mild cerebral atrophy is within normal limits for age. Major intracranial vascular flow voids are preserved. ORBITS: No acute abnormality. SINUSES: Mild mucosal thickening in the paranasal sinuses. Large bilateral mastoid effusions. BONES AND SOFT TISSUES: Normal bone marrow signal and enhancement. No acute soft tissue abnormality. IMPRESSION: 1. No acute/subacute infarct or definite intracranial metastatic disease on this motion-degraded study. 2. Innumerable chronic cerebral microhemorrhages suggestive of cerebral amyloid angiopathy. 3. Patchy subcortical T2 hyperintensities bilaterally as well as more confluent edema-type signal in the posterior left cerebral hemisphere, nonspecific but suspicious for inflammatory cerebral amyloid disease. Other considerations include posterior reversible encephalopathy syndrome and encephalitis. Electronically signed by: Dasie Hamburg MD 10/07/2024 07:06 PM EST RP Workstation: [id number]   CT Head Wo Contrast Result Date: 10/07/2024 EXAM: CT HEAD WITHOUT CONTRAST 10/07/2024 02:13:21 PM TECHNIQUE: CT of the head was performed without the administration of intravenous contrast. Automated exposure control, iterative reconstruction, and/or weight based adjustment of the mA/kV was utilized to reduce the radiation dose to as low as reasonably achievable. COMPARISON: CT head 11/24/2023. CLINICAL HISTORY: Head trauma, minor (Age >= 65y). FINDINGS: BRAIN AND VENTRICLES: No acute hemorrhage. There is transcortical hypoattenuation involving the left parietal lobe, new since prior exam. Overall similar  background mild scattered white matter hypodensities, which are nonspecific but most commonly represent chronic microvascular ischemic changes. No hydrocephalus. No extra-axial collection. No mass effect or midline shift. ORBITS: No acute abnormality. SINUSES: Moderate left mastoid effusion and left middle ear opacification, increased since prior exam. Increased mild right mastoid effusion. Decreased paranasal sinus inflammatory mucosal thickening SOFT TISSUES AND SKULL: No acute soft tissue abnormality. No skull fracture. IMPRESSION: 1. Since 11/24/2023, new left parietal hypoattenuation may represent recent ischemic infarction. Recommend MRI brain with IV contrast for further evaluation. 2. No acute intracranial hemorrhage. Electronically signed by: prentice spade 10/07/2024 02:55 PM EST RP Workstation: [id number]   CT PELVIS WO CONTRAST Result Date: 10/07/2024 CLINICAL DATA:  Suspect hip fracture. History of metastatic rectal carcinoma. Fall with right hip pain. EXAM: CT PELVIS WITHOUT CONTRAST TECHNIQUE: Multidetector CT imaging of the pelvis was performed following the standard protocol without intravenous contrast. RADIATION DOSE REDUCTION: This exam was performed according to the departmental dose-optimization program which includes automated exposure control, adjustment of the mA and/or kV according to patient size and/or use of iterative reconstruction technique. COMPARISON:  CT 07/01/2024 and plain x-ray right hip 10/07/2024 FINDINGS: Urinary Tract:  No abnormality visualized. Bowel: Possible small bowel loop in the left lower quadrant at the upper limits of  normal in diameter. Bowel is otherwise unremarkable. Vascular/Lymphatic: Calcified plaque over the abdominal aorta which is normal caliber. Mild calcified plaque over the external iliac arteries. No adenopathy. Reproductive:  No mass or other significant abnormality Other: No free fluid over the lower abdomen/pelvis. Moderate cholelithiasis present.  Musculoskeletal: No acute fracture or dislocation. Minimal degenerative change of the hips. Mild degenerate change of the spine. IMPRESSION: 1. No acute fracture or dislocation. 2. Moderate cholelithiasis. 3. Aortic atherosclerosis. Aortic Atherosclerosis (ICD10-I70.0). Electronically Signed   By: Toribio Agreste M.D.   On: 10/07/2024 14:40   CT Cervical Spine Wo Contrast Result Date: 10/07/2024 EXAM: CT CERVICAL SPINE WITHOUT CONTRAST 10/07/2024 02:13:21 PM TECHNIQUE: CT of the cervical spine was performed without the administration of intravenous contrast. Multiplanar reformatted images are provided for review. Automated exposure control, iterative reconstruction, and/or weight based adjustment of the mA/kV was utilized to reduce the radiation dose to as low as reasonably achievable. COMPARISON: None available. CLINICAL HISTORY: Neck trauma (Age >= 65y) FINDINGS: CERVICAL SPINE: BONES AND ALIGNMENT: No acute fracture or traumatic malalignment. DEGENERATIVE CHANGES: Early degenerative disc and facet disease bilaterally. SOFT TISSUES: No prevertebral soft tissue swelling. LUNGS: Partially visualized in the anterior right upper lobe of the chest is a 2.2 x 1.4 cm irregular masslike area in the right apex. This was noted on prior chest CT from 07/01/2024 when this area measured 2.5 x 1.9 cm. Recommend continued attention on follow-up chest imaging. IMPRESSION: 1. No acute abnormality of the cervical spine. 2. Partially visualized 2.2 x 1.4 cm irregular mass in the right lung apex, decreased in size from 2.5 x 1.9 cm on 07/01/2024; recommend continued follow-up chest imaging. Electronically signed by: Franky Crease MD 10/07/2024 02:37 PM EST RP Workstation: [id number]   DG Hip Unilat  With Pelvis 2-3 Views Right Result Date: 10/07/2024 EXAM: 2 VIEW(S) XRAY OF THE UNILATERAL HIP 10/07/2024 11:36:00 AM COMPARISON: Pelvic CT of 07/01/2024. CLINICAL HISTORY: Fall/Mobility issues. FINDINGS: BONES AND JOINTS: No acute  fracture or focal osseous lesion. Mild bilateral hip joint space narrowing and subchondral sclerosis are mild and relatively symmetric, consistent with mild bilateral hip osteoarthritis. SOFT TISSUES: Presumed vascular calcifications within the pelvis. INCIDENTAL FINDINGS: Gallstones. IMPRESSION: 1. No acute findings. 2. Mild bilateral hip osteoarthritis. 3. Cholelithiasis Electronically signed by: Rockey Kilts MD 10/07/2024 12:05 PM EST RP Workstation: [id number]    Scheduled Meds:  amLODipine   10 mg Oral Daily   Chlorhexidine  Gluconate Cloth  6 each Topical Daily   gabapentin   200 mg Oral TID   multivitamin with minerals  1 tablet Oral Daily   sodium chloride  flush  10-40 mL Intracatheter Q12H   Continuous Infusions:  lactated ringers        LOS: 0 days   Fredia Skeeter, MD [facility]  10/09/2024, 7:59 AM   *Please note that this is a verbal dictation therefore any spelling or grammatical errors are due to the Dragon Medical One system interpretation.  Please page via Amion and do not message via secure chat for urgent patient care matters. Secure chat can be used for non urgent patient care matters.  How to contact the [facility] Attending or Consulting provider 7A - 7P or covering provider during after hours 7P -7A, for this patient?  Check the care team in [place] and look for a) attending/consulting [facility] provider listed and b) the [facility] team listed. Page or secure chat 7A-7P. Log into [email or web address] and use [facility] universal password to access. If you do not have the password, please  contact the hospital operator. Locate the [facility] provider you are looking for under [facility] and page to a number that you can be directly reached. If you still have difficulty reaching the provider, please page the [facility] (Director on Call) for the Hospitalists listed on amion for assistance.

## 2024-10-09 NOTE — Plan of Care (Signed)

## 2024-10-09 NOTE — Evaluation (Signed)
 Physical Therapy Evaluation Patient Details Name: Jorge [name] MRN: [id number] DOB: May 31, 1951 Today's Date: 10/09/2024  History of Present Illness  73 y/o M who presented to [facility] for R sided-hip pain, difficulty walking, and several falls recently. Pt also noted to be confused on arrival. [facility] revealed new L parietal hypoattenuation. MRI brain showed T2 flair changes, edema in L posterior hemisphere and microhemorrhages with concern for cerebra amyloid angiopathy. CT c-spine revealed irregular mass in the R lung apex, negative for acute fractures. Pt transferred from [facility] to [facility] for further neurology workup. PMHx: metastatic colon CA on chemotherapy, HTN, HFpEF, smoker.   Clinical Impression  PTA pt was independent for mobility with no AD with hx of at least one fall. Pt and family reported being close to mobility baseline requiring supervision to stand and ambulate 63ft with no AD. Distance limited by continuous EEG monitoring. Pt did report pain in R glute musculature with pressure and with resisted hip flexion. Discussed recommendation for OP PT to address functional deficits. Pt will have intermittent help available upon d/c home. Acute PT to follow.       If plan is discharge home, recommend the following: Assist for transportation;Help with stairs or ramp for entrance   Can travel by private vehicle   Yes     Equipment Recommendations None recommended by PT     Functional Status Assessment Patient has had a recent decline in their functional status and demonstrates the ability to make significant improvements in function in a reasonable and predictable amount of time.     Precautions / Restrictions Precautions Precautions: Fall Recall of Precautions/Restrictions: Intact Precaution/Restrictions Comments: continuous EEG Restrictions Weight Bearing Restrictions Per Provider Order: No      Mobility  Bed Mobility Overal bed mobility: Needs Assistance Bed Mobility: Supine to  Sit     Supine to sit: Supervision     General bed mobility comments: supervision for line management    Transfers Overall transfer level: Needs assistance Equipment used: None Transfers: Sit to/from Stand Sit to Stand: Supervision           General transfer comment: supervision for safety with line management    Ambulation/Gait Ambulation/Gait assistance: Supervision Gait Distance (Feet): 60 Feet Assistive device: None Gait Pattern/deviations: Step-through pattern, Decreased stride length Gait velocity: decr     General Gait Details: steady gait with no challenges to balance, limited due to EEG lines. Able to turn quickly and negotiate around obstacles with no LOB    Modified Rankin (Stroke Patients Only) Modified Rankin (Stroke Patients Only) Pre-Morbid Rankin Score: No symptoms Modified Rankin: Moderate disability     Balance Overall balance assessment: Needs assistance Sitting-balance support: Single extremity supported, No upper extremity supported, Feet supported Sitting balance-Leahy Scale: Good Sitting balance - Comments: sitting EOB   Standing balance support: No upper extremity supported, During functional activity Standing balance-Leahy Scale: Fair        Pertinent Vitals/Pain Pain Assessment Pain Assessment: Faces Faces Pain Scale: Hurts little more Pain Location: R glute with resisted hip flexion and with pressure Pain Descriptors / Indicators: Discomfort Pain Intervention(s): Limited activity within patient's tolerance, Monitored during session, Repositioned    Home Living Family/patient expects to be discharged to:: Private residence Living Arrangements: Children Available Help at Discharge: Family;Available PRN/intermittently Type of Home: Apartment Home Access: Stairs to enter Entrance Stairs-Rails: Can reach both Entrance Stairs-Number of Steps: 3   Home Layout: One level Home Equipment: Standard Walker;Cane - single point  Prior Function Prior Level of Function : Independent/Modified Independent;History of Falls (last six months)    Mobility Comments: no AD PTA, reported 1 prior fal ADLs Comments: indep, reports he manages his own medications; family drives     Extremity/Trunk Assessment   Upper Extremity Assessment Upper Extremity Assessment: Defer to OT evaluation    Lower Extremity Assessment Lower Extremity Assessment: RLE deficits/detail RLE Deficits / Details: Pain with resisted hip flexion in R glute musculature. Knee ext 4+/5, Ankle DF 5/5 RLE Sensation: WNL RLE Coordination: WNL    Cervical / Trunk Assessment Cervical / Trunk Assessment: Normal  Communication   Communication Communication: No apparent difficulties    Cognition Arousal: Alert Behavior During Therapy: WFL for tasks assessed/performed   PT - Cognitive impairments: Orientation, Memory   Orientation impairments: Situation    PT - Cognition Comments: Did not remember why he came to the hospital with no recall of grandaughter driving him Following commands: Intact       Cueing Cueing Techniques: Verbal cues, Gestural cues     General Comments General comments (skin integrity, edema, etc.): Family present and supportive during eval     PT Assessment Patient needs continued PT services  PT Problem List Decreased strength;Decreased activity tolerance;Decreased balance;Decreased mobility;Pain       PT Treatment Interventions DME instruction;Gait training;Stair training;Functional mobility training;Therapeutic activities;Therapeutic exercise;Balance training;Neuromuscular re-education;Patient/family education    PT Goals (Current goals can be found in the Care Plan section)  Acute Rehab PT Goals Patient Stated Goal: to go home PT Goal Formulation: With patient Time For Goal Achievement: 10/23/24 Potential to Achieve Goals: Good    Frequency Min 1X/week        AM-PAC PT 6 Clicks Mobility  Outcome  Measure Help needed turning from your back to your side while in a flat bed without using bedrails?: None Help needed moving from lying on your back to sitting on the side of a flat bed without using bedrails?: A Little Help needed moving to and from a bed to a chair (including a wheelchair)?: A Little Help needed standing up from a chair using your arms (e.g., wheelchair or bedside chair)?: A Little Help needed to walk in hospital room?: A Little Help needed climbing 3-5 steps with a railing? : A Little 6 Click Score: 19    End of Session Equipment Utilized During Treatment: Gait belt Activity Tolerance: Patient tolerated treatment well Patient left: in bed;with call bell/phone within reach;with bed alarm set;with family/visitor present Nurse Communication: Mobility status PT Visit Diagnosis: Other abnormalities of gait and mobility (R26.89);Muscle weakness (generalized) (M62.81);History of falling (Z91.81)    Time: 8493-8467 PT Time Calculation (min) (ACUTE ONLY): 26 min   Charges:   PT Evaluation $PT Eval Low Complexity: 1 Low PT Treatments $Therapeutic Activity: 8-22 mins PT General Charges $$ ACUTE PT VISIT: 1 Visit       Kate ORN, PT, DPT Secure Chat Preferred  Rehab Office [phone]   Kate BRAVO Wendolyn 10/09/2024, 4:50 PM

## 2024-10-09 NOTE — Progress Notes (Addendum)
 NEUROLOGY CONSULT FOLLOW UP NOTE   Date of service: October 09, 2024 Patient Name: Jorge [name] MRN:  [id number] DOB:  09-Feb-1951  Interval Hx/subjective   Patient has remained hemodynamically stable and afebrile overnight.  Speech has greatly improved. Vitals   Vitals:   10/09/24 0450 10/09/24 0451 10/09/24 0749 10/09/24 1130  BP: (!) 153/93  (!) 140/105 (!) 156/88  Pulse: 83  88 82  Resp: (!) 21  16 16   Temp: (!) 97.5 F (36.4 C)  97.6 F (36.4 C) 98.1 F (36.7 C)  TempSrc: Oral  Oral Oral  SpO2: 98%  98% 98%  Weight:  61 kg    Height:         Body mass index is 18.24 kg/m.  Physical Exam   Constitutional: Thin appearing, well-developed elderly patient in no acute distress Psych: Affect appropriate to situation.  Eyes: No scleral injection.  HENT: No OP obstrucion.  Head: Normocephalic.  Cardiovascular: Normal rate and regular rhythm.  Respiratory: Effort normal, non-labored breathing.  Skin: WDI.   Neurologic Examination    NEURO:  Mental Status: AA&Ox3, able to give some history of present illness Speech/Language: speech is without dysarthria or aphasia.  Naming,  fluency, and comprehension intact.  Cranial Nerves:  II: PERRL. Visual fields full.  III, IV, VI: EOMI. Eyelids elevate symmetrically.  V: Sensation is intact to light touch and symmetrical to face.  VII: Smile is symmetrical.  VIII: hearing intact to voice. IX, X: Phonation is normal.  XII: tongue is midline without fasciculations. Motor: 5/5 strength to all muscle groups tested.  Tone: is normal and bulk is normal Sensation- Intact to light touch bilaterally.  Coordination: FTN intact bilaterally Gait- deferred   Medications  Current Facility-Administered Medications:    acetaminophen  (TYLENOL ) tablet 500 mg, 500 mg, Oral, Q6H PRN, Jorge [name], 500 mg at 10/09/24 9165   amLODipine  (NORVASC ) tablet 10 mg, 10 mg, Oral, Daily, Jorge Terry [name], [name], 10 mg at 10/09/24 9081    Chlorhexidine  Gluconate Cloth 2 % PADS 6 each, 6 each, Topical, Daily, Jorge [name], 6 each at 10/09/24 1045   gabapentin  (NEURONTIN ) capsule 200 mg, 200 mg, Oral, TID, Jorge [name], 200 mg at 10/09/24 9081   lactated ringers  bolus 1,000 mL, 1,000 mL, Intravenous, Once, Jorge [name]   loperamide (IMODIUM) capsule 2 mg, 2 mg, Oral, PRN, Jorge [name], 2 mg at 10/09/24 9081   melatonin tablet 5 mg, 5 mg, Oral, QHS PRN, Jorge [name]   multivitamin with minerals tablet 1 tablet, 1 tablet, Oral, Daily, Jorge Terry [name], [name], 1 tablet at 10/09/24 0918   polyethylene glycol (MIRALAX  / GLYCOLAX ) packet 17 g, 17 g, Oral, Daily PRN, Jorge [name]   prochlorperazine  (COMPAZINE ) injection 5 mg, 5 mg, Intravenous, Q6H PRN, Jorge [name]   sodium chloride  flush (NS) 0.9 % injection 10-40 mL, 10-40 mL, Intracatheter, Q12H, Jorge [name], 10 mL at 10/09/24 0920   sodium chloride  flush (NS) 0.9 % injection 10-40 mL, 10-40 mL, Intracatheter, PRN, Jorge [name]  Labs and Diagnostic Imaging   CBC:  Recent Labs  Lab 10/07/24 1107  WBC 4.0  HGB 18.2*  HCT 57.1*  MCV 94.7  PLT 277    Basic Metabolic Panel:  Lab Results  Component Value Date   NA 139 10/07/2024   K 4.0 10/07/2024   CO2 27 10/07/2024   GLUCOSE 97 10/07/2024   BUN 9  10/07/2024   CREATININE 0.79 10/07/2024   CALCIUM  9.8 10/07/2024   GFRNONAA >60 10/07/2024   Lipid Panel: No results found for: LDLCALC HgbA1c: No results found for: HGBA1C Urine Drug Screen: No results found for: LABOPIA, COCAINSCRNUR, LABBENZ, AMPHETMU, THCU, LABBARB  Alcohol Level No results found for: [facility] INR  Lab Results  Component Value Date   INR 1.1 10/09/2024   APTT  Lab Results  Component Value Date   APTT 30 10/09/2024    CT Head without contrast(Personally reviewed): New left parietal hypoattenuation, no other acute abnormality  MRI Brain(Personally reviewed): No acute or subacute  infarct or definite intracranial metastatic disease, innumerable chronic cerebral microhemorrhages suggestive of cerebral amyloid angiopathy, patchy subcortical T2 hyperintensities bilaterally as well as more confluent edema type signal in the posterior left cerebral hemisphere suspicious for inflammatory cerebral amyloid disease  Continuous EEG:  Pending  Assessment   Jorge [name] is a 73 y.o. 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  Speech is greatly improved today, with no remaining expressive or receptive aphasia noted on exam.  LTM EEG has been connected, awaiting read.  Lumbar puncture was attempted at the bedside but was unsuccessful, so we will reattempt under fluoroscopy.  Recommendations  -LTM EEG - Continue Keppra 500mg  BID - Lumbar puncture under fluoroscopy with CSF cell count, protein, glucose, meningitis encephalitis panel, CSF and serum paraneoplastic panels ______________________________________________________________________  Patient seen by [name] with MD, [name] to edit note as needed.  Signed, Jorge E Everitt Jorge [name] [facility]   NEUROHOSPITALIST ADDENDUM Performed a face to face diagnostic evaluation.   I have reviewed the contents of history and physical exam as documented by PA/ARNP/Resident and agree with above documentation.  I have discussed and formulated the above plan as documented. Edits to the note have been made as needed.  Impression/Key exam findings/Plan: speech is improved after Keppra load last night which  makes me think that this was an ictal phenomena. Will still need workup to identify etiology of noted edema. 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  Again had an extensive discussion with patient and grand daughter at the bedside.  LP at bedside was unsuccessful, will need LP under fluoro.  I personally spent a total of 55 minutes in the care of the patient today including preparing to see the patient, getting/reviewing separately obtained history, performing a medically appropriate exam/evaluation, counseling and educating, documenting clinical information in the EHR, independently interpreting results, and communicating results.   Shamecka Hocutt, [name] [facility] [phone]   If 7pm to 7am, please call on call as listed on AMION.

## 2024-10-09 NOTE — Care Management Obs Status (Signed)
 MEDICARE OBSERVATION STATUS NOTIFICATION   Patient Details  Name: Jorge [name] MRN: [id number] Date of Birth: 1951/10/12   Medicare Observation Status Notification Given:  Yes Obs notice signed and copy given.    Lodema Parma 10/09/2024, 10:47 AM

## 2024-10-10 ENCOUNTER — Other Ambulatory Visit (HOSPITAL_COMMUNITY): Payer: Self-pay

## 2024-10-10 ENCOUNTER — Inpatient Hospital Stay (HOSPITAL_COMMUNITY)

## 2024-10-10 DIAGNOSIS — R9389 Abnormal findings on diagnostic imaging of other specified body structures: Secondary | ICD-10-CM | POA: Diagnosis not present

## 2024-10-10 DIAGNOSIS — R569 Unspecified convulsions: Secondary | ICD-10-CM

## 2024-10-10 LAB — CBC WITH DIFFERENTIAL/PLATELET
Abs Immature Granulocytes: 0.01 K/uL (ref 0.00–0.07)
Basophils Absolute: 0 K/uL (ref 0.0–0.1)
Basophils Relative: 1 %
Eosinophils Absolute: 0.1 K/uL (ref 0.0–0.5)
Eosinophils Relative: 3 %
HCT: 39.8 % (ref 39.0–52.0)
Hemoglobin: 12.8 g/dL — ABNORMAL LOW (ref 13.0–17.0)
Immature Granulocytes: 0 %
Lymphocytes Relative: 20 %
Lymphs Abs: 0.8 K/uL (ref 0.7–4.0)
MCH: 30.3 pg (ref 26.0–34.0)
MCHC: 32.2 g/dL (ref 30.0–36.0)
MCV: 94.3 fL (ref 80.0–100.0)
Monocytes Absolute: 0.5 K/uL (ref 0.1–1.0)
Monocytes Relative: 13 %
Neutro Abs: 2.4 K/uL (ref 1.7–7.7)
Neutrophils Relative %: 63 %
Platelets: 192 K/uL (ref 150–400)
RBC: 4.22 MIL/uL (ref 4.22–5.81)
RDW: 16.3 % — ABNORMAL HIGH (ref 11.5–15.5)
WBC: 3.8 K/uL — ABNORMAL LOW (ref 4.0–10.5)
nRBC: 0 % (ref 0.0–0.2)

## 2024-10-10 LAB — MENINGITIS/ENCEPHALITIS PANEL (CSF)

## 2024-10-10 LAB — CSF CELL COUNT WITH DIFFERENTIAL
RBC Count, CSF: 1 /mm3 — ABNORMAL HIGH
RBC Count, CSF: 2 /mm3 — ABNORMAL HIGH
Tube #: 1
Tube #: 4
WBC, CSF: 1 /mm3 (ref 0–5)
WBC, CSF: 1 /mm3 (ref 0–5)

## 2024-10-10 LAB — COMPREHENSIVE METABOLIC PANEL WITH GFR
ALT: 33 U/L (ref 0–44)
AST: 44 U/L — ABNORMAL HIGH (ref 15–41)
Albumin: 2 g/dL — ABNORMAL LOW (ref 3.5–5.0)
Alkaline Phosphatase: 123 U/L (ref 38–126)
Anion gap: 4 — ABNORMAL LOW (ref 5–15)
BUN: 11 mg/dL (ref 8–23)
CO2: 30 mmol/L (ref 22–32)
Calcium: 8 mg/dL — ABNORMAL LOW (ref 8.9–10.3)
Chloride: 103 mmol/L (ref 98–111)
Creatinine, Ser: 0.92 mg/dL (ref 0.61–1.24)
GFR, Estimated: >60 mL/min (ref >60)
Glucose, Bld: 144 mg/dL — ABNORMAL HIGH (ref 70–99)
Potassium: 3.3 mmol/L — ABNORMAL LOW (ref 3.5–5.1)
Sodium: 137 mmol/L (ref 135–145)
Total Bilirubin: 0.6 mg/dL (ref 0.0–1.2)
Total Protein: 5.7 g/dL — ABNORMAL LOW (ref 6.5–8.1)

## 2024-10-10 LAB — PROTEIN AND GLUCOSE, CSF
Glucose, CSF: 64 mg/dL (ref 40–70)
Total  Protein, CSF: 81 mg/dL — ABNORMAL HIGH (ref 15–45)

## 2024-10-10 LAB — T4, FREE: Free T4: 0.83 ng/dL (ref 0.61–1.12)

## 2024-10-10 MED ORDER — LIDOCAINE 1 % OPTIME INJ - NO CHARGE
5.0000 mL | Freq: Once | INTRAMUSCULAR | Status: AC
  Administered 2024-10-10: 2 mL via INTRADERMAL
  Filled 2024-10-10: qty 6

## 2024-10-10 MED ORDER — SODIUM CHLORIDE 0.9% FLUSH: 10.0000 mL | INTRAVENOUS | Status: DC | PRN

## 2024-10-10 MED ORDER — LEVETIRACETAM (KEPPRA) 500 MG/5 ML ADULT IV PUSH
500.0000 mg | Freq: Two times a day (BID) | INTRAVENOUS | Status: DC
  Administered 2024-10-10 – 2024-10-12 (×5): 500 mg via INTRAVENOUS
  Filled 2024-10-10 (×5): qty 5

## 2024-10-10 MED ORDER — POTASSIUM CHLORIDE CRYS ER 20 MEQ PO TBCR
40.0000 meq | EXTENDED_RELEASE_TABLET | ORAL | Status: AC
  Administered 2024-10-10 (×2): 40 meq via ORAL
  Filled 2024-10-10 (×2): qty 2

## 2024-10-10 MED ORDER — PANTOPRAZOLE SODIUM 40 MG PO TBEC
40.0000 mg | DELAYED_RELEASE_TABLET | Freq: Every day | ORAL | Status: DC
  Administered 2024-10-10 – 2024-10-12 (×3): 40 mg via ORAL
  Filled 2024-10-10 (×3): qty 1

## 2024-10-10 MED ORDER — SODIUM CHLORIDE 0.9% FLUSH
10.0000 mL | Freq: Two times a day (BID) | INTRAVENOUS | Status: DC
  Administered 2024-10-10 – 2024-10-12 (×4): 10 mL

## 2024-10-10 MED ORDER — PANTOPRAZOLE SODIUM 40 MG IV SOLR: 40.0000 mg | INTRAVENOUS | Status: DC

## 2024-10-10 MED ORDER — SODIUM CHLORIDE 0.9 % IV SOLN
1000.0000 mg | INTRAVENOUS | Status: AC
  Administered 2024-10-10 – 2024-10-12 (×3): 1000 mg via INTRAVENOUS
  Filled 2024-10-10 (×4): qty 16

## 2024-10-10 NOTE — TOC Progression Note (Signed)
 Transition of Care [facility]) - Progression Note    Patient Details  Name: Jorge [name] MRN: [id number] Date of Birth: 08-Feb-1951  [facility] CM/SW Contact  Corean JAYSON Canary, RN Phone Number: 10/10/2024, 12:34 PM  Clinical Narrative:     PT saw patient and recommended OP PT He is followed by palliative OP, he is currently taking chemotherapy . Referral sent to [facility] neuro  IPCM will continue to follow  Expected Discharge Plan: (P) OP Rehab Barriers to Discharge: Continued Medical Work up               Expected Discharge Plan and Services                                               Social Drivers of Health (SDOH) Interventions SDOH Screenings   Food Insecurity: No Food Insecurity (10/07/2024)  Housing: Low Risk  (10/07/2024)  Transportation Needs: No Transportation Needs (10/07/2024)  Utilities: Not At Risk (10/07/2024)  Depression (PHQ2-9): Low Risk  (09/11/2024)  Physical Activity: Sufficiently Active (01/18/2024)  Social Connections: Socially Isolated (10/07/2024)  Tobacco Use: Medium Risk (10/07/2024)    Readmission Risk Interventions    11/27/2023   12:54 PM  Readmission Risk Prevention Plan  Transportation Screening Complete  PCP or Specialist Appt within 3-5 Days Complete  HRI or Home Care Consult Complete  Social Work Consult for Recovery Care Planning/Counseling Complete  Palliative Care Screening Complete  Medication Review [employer]) Complete

## 2024-10-10 NOTE — Progress Notes (Signed)
 LTM VIDEO EEG discontinued - no skin breakdown at [facility].

## 2024-10-10 NOTE — Progress Notes (Signed)
 Upon entering patients room to administer IV medication.  R chest port was noted to have been de accessed.  The patient stated I thought you didn't need to use it anymore.  The granddaughter stated  I told you not to do that.  I educated the patient and family regarding the port while in the hospital and they both verbalized understanding.  I also notified the MD and charge RN.  I have also placed an order for the IV team to come and access the port.

## 2024-10-10 NOTE — Plan of Care (Signed)

## 2024-10-10 NOTE — Progress Notes (Signed)
 Nurse call EEG tech about removal of leads. Informed nurse that no one is available at this moment, as schedule allows we will get to patient for EEG lead removal. Nurse stated they will remove leads.

## 2024-10-10 NOTE — Progress Notes (Signed)
 NEUROLOGY CONSULT FOLLOW UP NOTE   Date of service: October 10, 2024 Patient Name: Jorge [name] MRN:  [id number] DOB:  10/07/1951  Interval Hx/subjective  Patient continues to be hemodynamically stable and afebrile.  He has undergone lumbar puncture under fluoroscopy and is awaiting results.  LTM EEG demonstrates no seizures and can be discontinued. Vitals   Vitals:   10/10/24 0344 10/10/24 0500 10/10/24 0837 10/10/24 0838  BP: (!) 144/96  (!) 140/83 (!) 140/83  Pulse: 76   83  Resp: 17   16  Temp: 98 F (36.7 C)   98.8 F (37.1 C)  TempSrc: Oral   Oral  SpO2: 99%   98%  Weight:  61.7 kg    Height:         Body mass index is 18.45 kg/m.  Physical Exam   Constitutional: Thin appearing, well-developed elderly patient in no acute distress Psych: Affect appropriate to situation.  Eyes: No scleral injection.  HENT: No OP obstrucion.  Head: Normocephalic.  Cardiovascular: Normal rate and regular rhythm.  Respiratory: Effort normal, non-labored breathing.  Skin: WDI.   Neurologic Examination    NEURO:  Mental Status: AA&Ox3, able to give some history of present illness Speech/Language: speech is without dysarthria or aphasia.  Naming,  fluency, and comprehension intact.  Cranial Nerves:  II: PERRL. Visual fields full.  III, IV, VI: EOMI. Eyelids elevate symmetrically.  V: Sensation is intact to light touch and symmetrical to face.  VII: Smile is symmetrical.  VIII: hearing intact to voice. IX, X: Phonation is normal.  XII: tongue is midline without fasciculations. Motor: 5/5 strength to all muscle groups tested.  Tone: is normal and bulk is normal Sensation- Intact to light touch bilaterally.  Coordination: FTN intact bilaterally Gait- deferred   Medications  Current Facility-Administered Medications:    acetaminophen  (TYLENOL ) tablet 500 mg, 500 mg, Oral, Q6H PRN, Shona Terry SAILOR, DO, 500 mg at 10/09/24 1610   amLODipine  (NORVASC ) tablet 10 mg, 10 mg, Oral,  Daily, Hall, Carole N, DO, 10 mg at 10/10/24 9162   Chlorhexidine  Gluconate Cloth 2 % PADS 6 each, 6 each, Topical, Daily, Shona Terry SAILOR, DO, 6 each at 10/10/24 9161   gabapentin  (NEURONTIN ) capsule 200 mg, 200 mg, Oral, TID, Rizwan, Saima, MD, 200 mg at 10/10/24 9162   lactated ringers  bolus 1,000 mL, 1,000 mL, Intravenous, Once, Rogelia Jerilynn RAMAN, MD   levETIRAcetam  (KEPPRA ) undiluted injection 500 mg, 500 mg, Intravenous, Q12H, de Clint Kill, [place] E, NP, 500 mg at 10/10/24 9243   loperamide  (IMODIUM ) capsule 2 mg, 2 mg, Oral, PRN, Pahwani, Ravi, MD, 2 mg at 10/10/24 0837   melatonin tablet 5 mg, 5 mg, Oral, QHS PRN, Shona Terry N, DO, 5 mg at 10/09/24 2052   multivitamin with minerals tablet 1 tablet, 1 tablet, Oral, Daily, Shona Terry N, DO, 1 tablet at 10/10/24 0837   polyethylene glycol (MIRALAX  / GLYCOLAX ) packet 17 g, 17 g, Oral, Daily PRN, Hall, Carole N, DO   potassium chloride  SA (KLOR-CON  M) CR tablet 40 mEq, 40 mEq, Oral, Q4H, Pahwani, Fredia, MD, 40 mEq at 10/10/24 1239   prochlorperazine  (COMPAZINE ) injection 5 mg, 5 mg, Intravenous, Q6H PRN, Shona, Carole N, DO   sodium chloride  flush (NS) 0.9 % injection 10-40 mL, 10-40 mL, Intracatheter, Q12H, Hall, Carole N, DO, 30 mL at 10/10/24 0839   sodium chloride  flush (NS) 0.9 % injection 10-40 mL, 10-40 mL, Intracatheter, PRN, Shona Terry SAILOR, DO  Labs and Diagnostic Imaging  CBC:  Recent Labs  Lab 10/07/24 1107 10/10/24 0932  WBC 4.0 3.8*  NEUTROABS  --  2.4  HGB 18.2* 12.8*  HCT 57.1* 39.8  MCV 94.7 94.3  PLT 277 192    Basic Metabolic Panel:  Lab Results  Component Value Date   NA 137 10/10/2024   K 3.3 (L) 10/10/2024   CO2 30 10/10/2024   GLUCOSE 144 (H) 10/10/2024   BUN 11 10/10/2024   CREATININE 0.92 10/10/2024   CALCIUM  8.0 (L) 10/10/2024   GFRNONAA >60 10/10/2024   Lipid Panel: No results found for: LDLCALC HgbA1c: No results found for: HGBA1C Urine Drug Screen: No results found for: LABOPIA,  COCAINSCRNUR, LABBENZ, AMPHETMU, THCU, LABBARB  Alcohol Level No results found for: [facility] INR  Lab Results  Component Value Date   INR 1.1 10/09/2024   APTT  Lab Results  Component Value Date   APTT 30 10/09/2024    CT Head without contrast(Personally reviewed): New left parietal hypoattenuation, no other acute abnormality  MRI Brain(Personally reviewed): No acute or subacute infarct or definite intracranial metastatic disease, innumerable chronic cerebral microhemorrhages suggestive of cerebral amyloid angiopathy, patchy subcortical T2 hyperintensities bilaterally as well as more confluent edema type signal in the posterior left cerebral hemisphere suspicious for inflammatory cerebral amyloid disease  Continuous EEG 12/4-12/5:  - Intermittent slow, generalized This study is suggestive of generalized cerebral dysfunction ( encephalopathy). No seizures or epileptiform discharges were seen throughout the recording.  Assessment   Jorge [name] is a 73 y.o. 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  Speech is greatly improved today, with no remaining expressive or receptive aphasia noted on exam.  LTM EEG demonstrates no seizure activity and can be discontinued.  However, we will continue Keppra .  Lumbar puncture was attempted at the bedside but was unsuccessful, and procedure was successfully performed under fluoroscopy today.  Awaiting results, and if no indicators of infection were seen we will start patient on steroids for inflammatory cerebral  amyloid disease.  Recommendations  - Discontinue LTM EEG - Continue Keppra  500mg  BID - Will plan to start steroid therapy for inflammatory cerebral amyloid disease if lumbar puncture does not demonstrate an infectious picture ______________________________________________________________________  Patient seen by NP with MD, MD to edit note as needed.  Signed, Cortney E Everitt Clint Kill, NP [facility]   NEUROHOSPITALIST ADDENDUM Performed a face to face diagnostic evaluation.   I have reviewed the contents of history and physical exam as documented by PA/ARNP/Resident and agree with above documentation.  I have discussed and formulated the above plan as documented. Edits to the note have been made as needed.  Impression/Key exam findings/Plan: LP under fluoro with no pleocytosis. Mildly elevated protein. Meningitis/encephalitis panel is negative. Overall, low suspicion for infection given pt is awake, alert, vitals stable, no headache, no meningismus, prelim CSF studies are not concerning for meningitis.  Given high suspicion for Cerebral amyloid angiopathy related inflammation, will start solumedrol 1000mg  IV daily x 3 doses, then PO 50mg  daily x 4 weeks, then 40mg  x1 week, 30mg  x 1 week, then 20mg  x 1 week, then 10mg  daily. Recommend repeat MRI  Brain w/o contrast in 4 weeks.  Continue Keppra  500mg  BID.  Jorge Bittinger, MD [facility] [phone]   If 7pm to 7am, please call on call as listed on AMION.

## 2024-10-10 NOTE — Plan of Care (Signed)
°  Problem: Education: °Goal: Knowledge of General Education information will improve °Description: Including pain rating scale, medication(s)/side effects and non-pharmacologic comfort measures °Outcome: Progressing °  °Problem: Clinical Measurements: °Goal: Cardiovascular complication will be avoided °Outcome: Progressing °  °Problem: Activity: °Goal: Risk for activity intolerance will decrease °Outcome: Progressing °  °

## 2024-10-10 NOTE — Progress Notes (Signed)
 PROGRESS NOTE    Jorge [name]  FMW:[id number] DOB: 1951-05-06 DOA: 10/07/2024 PCP: Petrina Pries, NP   Brief Narrative:  This is a 73 year old male with metastatic colon cancer on chemotherapy, hypertension, chronic HFpEF, smoker who presented to the hospital for right-sided hip pain, trouble walking and numerous falls over the past several days.  Also noted to be more confused and having trouble remembering things. In the ED he was found to have 4/5 weakness in the right lower extremity.  Further workup involved with CT of the head which revealed a new left parietal hypoattenuation. MRI of the brain was performed which revealed: Patchy subcortical hyperintensities bilaterally as well as more confluent edema type signal in the posterior left cerebral hemisphere, innumerable chronic cerebral microhemorrhages suggestive of cerebral amyloid angiopathy CT of the pelvis was also performed which was unremarkable CT of the C-spine revealed an irregular mass in the right lung apex which was partially visualized but decreased in size from prior imaging on 07/01/2024.  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  Per neurology, other differential includes PRES, paraneoplastic encephalitis, checkpoint inhibitor induced neurotoxicity/encephalitis.  Transferred to [facility], loaded with Keppra , underwent LP, continuous EEG.  Labs/fluid analysis pending.   Remains on Keppra .  Management per neurology and we appreciate their help.    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 Restaging CT on 07/01/2024 showed disease progression -Currently being managed by Dr. Ileana with cabozantinib .  Oncology following.   Right hip pain? CT of the pelvis was performed which was unremarkable, PT OT on board.   Hypertension Blood pressure controlled.  Continue amlodipine    Peripheral neuropathy - Continue gabapentin   Chronic diarrhea: Patient has chronic diarrhea and takes Imodium  at home.  No indication to check C. difficile as patient has no abdominal pain, tenderness or fever or leukocytosis.  Ordered Imodium  as needed.  Elevated TSH: Checking T3 and T4.  Hypokalemia: Will replenish.  DVT prophylaxis: SCDs Start: 10/07/24 1945   Code Status: Limited: Do not attempt resuscitation (DNR) -DNR-LIMITED -Do Not Intubate/DNI   Family Communication: None present at bedside.  Plan of care discussed with patient in length and he/she verbalized understanding and agreed with it.  Status is: Inpatient Remains inpatient appropriate because: Will be discharged when cleared by neurology.     Estimated body mass index is 18.45 kg/m as calculated from the following:   Height as of this encounter: 6' (1.829 m).   Weight as of this encounter: 61.7 kg.    Nutritional Assessment: Body mass index is 18.45 kg/m.SABRA Seen by dietician.  I agree with the assessment and plan as outlined below: Nutrition Status:        . Skin Assessment: I have examined the patient's skin and I agree with the wound assessment as performed by the wound care RN as outlined below:    Consultants:  Neurology and oncology  Procedures:  As above  Antimicrobials:  Anti-infectives (From admission, onward)  None         Subjective: Seen and examined.  Remains alert and oriented.  Has no complaints today.  Objective: Vitals:    10/09/24 2018 10/09/24 2352 10/10/24 0344 10/10/24 0500  BP: (!) 145/69 (!) 143/79 (!) 144/96   Pulse: 75  76   Resp: 18 18 17    Temp: 98.5 F (36.9 C) 97.8 F (36.6 C) 98 F (36.7 C)   TempSrc: Oral Oral Oral   SpO2: 98% 99% 99%   Weight:    61.7 kg  Height:        Intake/Output Summary (Last 24 hours) at 10/10/2024 0807 Last data filed at 10/10/2024 0700 Gross per 24 hour  Intake 490 ml  Output --  Net 490 ml   Filed Weights   10/07/24 2036 10/09/24 0451 10/10/24 0500  Weight: 58.1 kg 61 kg 61.7 kg    Examination:  General exam: Appears calm and comfortable  Respiratory system: Clear to auscultation. Respiratory effort normal. Cardiovascular system: S1 & S2 heard, RRR. No JVD, murmurs, rubs, gallops or clicks. No pedal edema. Gastrointestinal system: Abdomen is nondistended, soft and nontender. No organomegaly or masses felt. Normal bowel sounds heard. Central nervous system: Alert and oriented. No focal neurological deficits. Extremities: Symmetric 5 x 5 power. Skin: No rashes, lesions or ulcers.  Psychiatry: Judgement and insight appear normal. Mood & affect appropriate.    Data Reviewed: I have personally reviewed following labs and imaging studies  CBC: Recent Labs  Lab 10/07/24 1107  WBC 4.0  HGB 18.2*  HCT 57.1*  MCV 94.7  PLT 277   Basic Metabolic Panel: Recent Labs  Lab 10/07/24 1107  NA 139  K 4.0  CL 98  CO2 27  GLUCOSE 97  BUN 9  CREATININE 0.79  CALCIUM  9.8   GFR: Estimated Creatinine Clearance: 71.8 mL/min (by C-G formula based on SCr of 0.79 mg/dL). Liver Function Tests: Recent Labs  Lab 10/07/24 1107  AST 87*  ALT 59*  ALKPHOS 192*  BILITOT 0.7  PROT 8.7*  ALBUMIN 3.8   No results for input(s): LIPASE, AMYLASE in the last 168 hours. Recent Labs  Lab 10/07/24 1615  AMMONIA 47*   Coagulation Profile: Recent Labs  Lab 10/09/24 0500  INR 1.1   Cardiac Enzymes: No results for input(s): CKTOTAL, CKMB,  CKMBINDEX, TROPONINI in the last 168 hours. BNP (last 3 results) No results for input(s): PROBNP in the last 8760 hours. HbA1C: No results for input(s): HGBA1C in the last 72 hours. CBG: No results for input(s): GLUCAP in the last 168 hours. Lipid Profile: No results for input(s): CHOL, HDL, LDLCALC, TRIG, CHOLHDL, LDLDIRECT in the last 72 hours. Thyroid  Function Tests: Recent Labs    10/07/24 2108  TSH 6.230*  FREET4 0.78   Anemia Panel: No results for input(s): VITAMINB12, FOLATE, FERRITIN, TIBC, IRON, RETICCTPCT in the last 72 hours. Sepsis Labs: No results for input(s): PROCALCITON, LATICACIDVEN in the last 168 hours.  Recent Results (from the past 240 hours)  Urine Culture     Status: None   Collection Time: 10/07/24  1:03 PM   Specimen: Urine, Clean Catch  Result Value Ref Range Status   Specimen Description   Final    URINE, CLEAN CATCH Performed at [facility], [address], [place], [place] [address]    Special Requests   Final    NONE Performed at [facility], [address], [place], [place] [address]    Culture  Final    NO GROWTH Performed at [facility], [address], [place], [place] [address]    Report Status 10/08/2024 FINAL  Final     Radiology Studies: No results found.   Scheduled Meds:  amLODipine   10 mg Oral Daily   Chlorhexidine  Gluconate Cloth  6 each Topical Daily   gabapentin   200 mg Oral TID   levETIRAcetam   500 mg Intravenous Q12H   multivitamin with minerals  1 tablet Oral Daily   sodium chloride  flush  10-40 mL Intracatheter Q12H   Continuous Infusions:  lactated ringers        LOS: 1 day   Jorge Skeeter, MD [facility]  10/10/2024, 8:07 AM   *Please note that this is a verbal dictation therefore any spelling or grammatical errors are due to the Dragon Medical One system interpretation.  Please page via Amion and do not message  via secure chat for urgent patient care matters. Secure chat can be used for non urgent patient care matters.  How to contact the TRH Attending or Consulting provider 7A - 7P or covering provider during after hours 7P -7A, for this patient?  Check the care team in [facility] and look for a) attending/consulting TRH provider listed and b) the TRH team listed. Page or secure chat 7A-7P. Log into [email or web address] and use [place]'s universal password to access. If you do not have the password, please contact the hospital operator. Locate the TRH provider you are looking for under [facility] and page to a number that you can be directly reached. If you still have difficulty reaching the provider, please page the [facility] (Director on Call) for the Hospitalists listed on amion for assistance.

## 2024-10-10 NOTE — Procedures (Addendum)
 Patient Name: Jorge [name]  MRN: [id number]  Epilepsy Attending: Arlin MALVA Krebs  Referring Physician/Provider: Khaliqdina, Salman, MD  Duration: 10/09/2024 0957 to 10/10/2024 1006  Patient history:  73 y.o. 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 EEG to evaluate for seizure   Level of alertness: Awake, asleep  AEDs during EEG study: LEV, GBP  Technical aspects: This EEG study was done with scalp electrodes positioned according to the 10-20 International system of electrode placement. Electrical activity was reviewed with band pass filter of 1-70Hz , sensitivity of 7 uV/mm, display speed of 72mm/sec with a 60Hz  notched filter applied as appropriate. EEG data were recorded continuously and digitally stored.  Video monitoring was available and reviewed as appropriate.  Description: The posterior dominant rhythm consists of 8 Hz activity of moderate voltage (25-35 uV) seen predominantly in posterior head regions, symmetric and reactive to eye opening and eye closing. Sleep was characterized by vertex waves, sleep spindles (12 to 14 Hz), maximal frontocentral region. There is intermittent generalized 3 to 6 Hz theta-delta slowing. Hyperventilation and photic stimulation were not performed.     ABNORMALITY - Intermittent slow, generalized  IMPRESSION: This study is suggestive of generalized cerebral dysfunction ( encephalopathy). No seizures or epileptiform discharges were seen throughout the recording.   Berneda Piccininni O Jodey Burbano

## 2024-10-11 DIAGNOSIS — R9389 Abnormal findings on diagnostic imaging of other specified body structures: Secondary | ICD-10-CM | POA: Diagnosis not present

## 2024-10-11 LAB — COMPREHENSIVE METABOLIC PANEL WITH GFR
ALT: 34 U/L (ref 0–44)
AST: 44 U/L — ABNORMAL HIGH (ref 15–41)
Albumin: 2.2 g/dL — ABNORMAL LOW (ref 3.5–5.0)
Alkaline Phosphatase: 123 U/L (ref 38–126)
Anion gap: 6 (ref 5–15)
BUN: 13 mg/dL (ref 8–23)
CO2: 27 mmol/L (ref 22–32)
Calcium: 8 mg/dL — ABNORMAL LOW (ref 8.9–10.3)
Chloride: 102 mmol/L (ref 98–111)
Creatinine, Ser: 0.96 mg/dL (ref 0.61–1.24)
GFR, Estimated: >60 mL/min (ref >60)
Glucose, Bld: 162 mg/dL — ABNORMAL HIGH (ref 70–99)
Potassium: 4.5 mmol/L (ref 3.5–5.1)
Sodium: 135 mmol/L (ref 135–145)
Total Bilirubin: 0.9 mg/dL (ref 0.0–1.2)
Total Protein: 6 g/dL — ABNORMAL LOW (ref 6.5–8.1)

## 2024-10-11 LAB — T3: T3, Total: 85 ng/dL (ref 71–180)

## 2024-10-11 MED ORDER — OYSTER SHELL CALCIUM/D3 500-5 MG-MCG PO TABS
2.0000 | ORAL_TABLET | Freq: Every day | ORAL | Status: DC
  Administered 2024-10-12: 2 via ORAL
  Filled 2024-10-11: qty 2

## 2024-10-11 MED ORDER — SULFAMETHOXAZOLE-TRIMETHOPRIM 400-80 MG PO TABS
1.0000 | ORAL_TABLET | Freq: Every day | ORAL | Status: DC
  Administered 2024-10-11 – 2024-10-12 (×2): 1 via ORAL
  Filled 2024-10-11 (×2): qty 1

## 2024-10-11 NOTE — Plan of Care (Signed)
  Problem: Education: Goal: Knowledge of General Education information will improve Description: Including pain rating scale, medication(s)/side effects and non-pharmacologic comfort measures Outcome: Not Progressing   Problem: Activity: Goal: Risk for activity intolerance will decrease Outcome: Progressing   Problem: Pain Managment: Goal: General experience of comfort will improve and/or be controlled Outcome: Progressing   Problem: Safety: Goal: Ability to remain free from injury will improve Outcome: Progressing   Problem: Skin Integrity: Goal: Risk for impaired skin integrity will decrease Outcome: Progressing

## 2024-10-11 NOTE — Progress Notes (Signed)
 PROGRESS NOTE    Jorge [name]  FMW:[id number] DOB: 03-Jan-1951 DOA: 10/07/2024 PCP: Petrina Pries, NP   Brief Narrative:  This is a 73 year old male with metastatic colon cancer on chemotherapy, hypertension, chronic HFpEF, smoker who presented to the hospital for right-sided hip pain, trouble walking and numerous falls over the past several days.  Also noted to be more confused and having trouble remembering things. In the ED he was found to have 4/5 weakness in the right lower extremity.  Further workup involved with CT of the head which revealed a new left parietal hypoattenuation. MRI of the brain was performed which revealed: Patchy subcortical hyperintensities bilaterally as well as more confluent edema type signal in the posterior left cerebral hemisphere, innumerable chronic cerebral microhemorrhages suggestive of cerebral amyloid angiopathy CT of the pelvis was also performed which was unremarkable CT of the C-spine revealed an irregular mass in the right lung apex which was partially visualized but decreased in size from prior imaging on 07/01/2024.  Patient was seen by neurology and per their recommendations was transferred from [facility] 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  Cerebral amyloid angiopathy: MRI Brain with patchy T2/FLAIR hyperintensities bilaterally as well as more confluent edema-type signal in the posterior left cerebral hemisphere, nonspecific but suspicious for inflammatory cerebral amyloid disease.  Per neurology, recommendations, transferred to [facility], loaded with Keppra , underwent LP, continuous EEG.  EEG negative for epileptiform activity.  Neurology suspects cerebral amyloid angiopathy and has started on  high-dose Solu-Medrol .  Please refer to  Dr. Gill note from 10/10/2024 for steroid tapering recommendations.  Management per neurology and we appreciate their help.    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 Restaging CT on 07/01/2024 showed disease progression -Currently being managed by Dr. Ileana with cabozantinib .  Oncology following.   Right hip pain? CT of the pelvis was performed which was unremarkable, PT OT on board.   Hypertension Blood pressure controlled.  Continue amlodipine    Peripheral neuropathy - Continue gabapentin   Chronic diarrhea: Patient has chronic diarrhea and takes Imodium  at home.  No indication to check C. difficile as patient has no abdominal pain, tenderness or fever or leukocytosis.  Ordered Imodium  as needed.  Subclinical hypothyroidism: Elevated TSH but T3-T4 normal.  Recommend repeating TSH in 6 to 8 weeks.  Hypokalemia: Resolved.  DVT prophylaxis: SCDs Start: 10/07/24 1945   Code Status: Limited: Do not attempt resuscitation (DNR) -DNR-LIMITED -Do Not Intubate/DNI   Family Communication: Daughter present at bedside.  Plan of care discussed with patient in length and he/she verbalized understanding and agreed with it.  Status is: Inpatient Remains inpatient appropriate because: Will be discharged when cleared by neurology.  Potential discharge tomorrow though.     Estimated body mass index is 18.54 kg/m as calculated from the following:   Height as of this encounter: 6' (1.829 m).   Weight as of this encounter: 62 kg.    Nutritional Assessment: Body mass index is 18.54 kg/m.SABRA Seen by dietician.  I agree with the assessment and plan as outlined below: Nutrition Status:        . Skin Assessment: I have examined the patient's skin and I agree with the wound assessment as  performed by the wound care RN as outlined below:    Consultants:  Neurology and oncology  Procedures:  As  above  Antimicrobials:  Anti-infectives (From admission, onward)    None         Subjective: Patient seen and examined, daughter at the bedside.  Other than having diarrhea which is chronic for the patient, patient has no complaints.  He is fully alert and oriented.  Objective: Vitals:   10/10/24 1942 10/10/24 2332 10/11/24 0341 10/11/24 0400  BP: (!) 144/72 (!) 149/97  125/74  Pulse: 83 82    Resp: 18 18  20   Temp: 98.2 F (36.8 C) 98 F (36.7 C)  98 F (36.7 C)  TempSrc: Oral Oral  Oral  SpO2: 99% 99%  99%  Weight:   62 kg   Height:        Intake/Output Summary (Last 24 hours) at 10/11/2024 0757 Last data filed at 10/10/2024 2106 Gross per 24 hour  Intake 150 ml  Output --  Net 150 ml   Filed Weights   10/09/24 0451 10/10/24 0500 10/11/24 0341  Weight: 61 kg 61.7 kg 62 kg    Examination:  General exam: Appears calm and comfortable  Respiratory system: Clear to auscultation. Respiratory effort normal. Cardiovascular system: S1 & S2 heard, RRR. No JVD, murmurs, rubs, gallops or clicks. No pedal edema. Gastrointestinal system: Abdomen is nondistended, soft and nontender. No organomegaly or masses felt. Normal bowel sounds heard. Central nervous system: Alert and oriented. No focal neurological deficits. Extremities: Symmetric 5 x 5 power. Skin: No rashes, lesions or ulcers.   Data Reviewed: I have personally reviewed following labs and imaging studies  CBC: Recent Labs  Lab 10/07/24 1107 10/10/24 0932  WBC 4.0 3.8*  NEUTROABS  --  2.4  HGB 18.2* 12.8*  HCT 57.1* 39.8  MCV 94.7 94.3  PLT 277 192   Basic Metabolic Panel: Recent Labs  Lab 10/07/24 1107 10/10/24 0932 10/11/24 0600  NA 139 137 135  K 4.0 3.3* 4.5  CL 98 103 102  CO2 27 30 27   GLUCOSE 97 144* 162*  BUN 9 11 13   CREATININE 0.79 0.92 0.96  CALCIUM  9.8 8.0* 8.0*   GFR: Estimated Creatinine Clearance: 60.1 mL/min (by C-G formula based on SCr of 0.96 mg/dL). Liver Function  Tests: Recent Labs  Lab 10/07/24 1107 10/10/24 0932 10/11/24 0600  AST 87* 44* 44*  ALT 59* 33 34  ALKPHOS 192* 123 123  BILITOT 0.7 0.6 0.9  PROT 8.7* 5.7* 6.0*  ALBUMIN 3.8 2.0* 2.2*   No results for input(s): LIPASE, AMYLASE in the last 168 hours. Recent Labs  Lab 10/07/24 1615  AMMONIA 47*   Coagulation Profile: Recent Labs  Lab 10/09/24 0500  INR 1.1   Cardiac Enzymes: No results for input(s): CKTOTAL, CKMB, CKMBINDEX, TROPONINI in the last 168 hours. BNP (last 3 results) No results for input(s): PROBNP in the last 8760 hours. HbA1C: No results for input(s): HGBA1C in the last 72 hours. CBG: No results for input(s): GLUCAP in the last 168 hours. Lipid Profile: No results for input(s): CHOL, HDL, LDLCALC, TRIG, CHOLHDL, LDLDIRECT in the last 72 hours. Thyroid  Function Tests: Recent Labs    10/10/24 0932  FREET4 0.83   Anemia Panel: No results for input(s): VITAMINB12, FOLATE, FERRITIN, TIBC, IRON, RETICCTPCT in the last 72 hours. Sepsis Labs: No results for input(s): PROCALCITON, LATICACIDVEN in the last 168 hours.  Recent Results (from the past 240 hours)  Urine Culture  Status: None   Collection Time: 10/07/24  1:03 PM   Specimen: Urine, Clean Catch  Result Value Ref Range Status   Specimen Description   Final    URINE, CLEAN CATCH Performed at [facility], [address], [place], [place] [address]    Special Requests   Final    NONE Performed at [facility], [address] [place], [place] [address]    Culture   Final    NO GROWTH Performed at [facility], [address] [place], [place] [address]    Report Status 10/08/2024 FINAL  Final     Radiology Studies: DG FL GUIDED LUMBAR PUNCTURE Result Date: 10/10/2024 CLINICAL DATA:  Inpatient with MRI brain demonstrated multiple chronic cerebral microhemorrhages suggestive of cerebral amyloid  angiopathy with request for image guided diagnostic LP. EXAM: LUMBAR PUNCTURE UNDER FLUOROSCOPY PROCEDURE: An appropriate skin entry site was determined fluoroscopically. Operator donned sterile gloves and mask. Skin site was marked, then prepped with Betadine, draped in usual sterile fashion, and infiltrated locally with 1% lidocaine . A 20 gauge spinal needle advanced into the thecal sac at L4-5 from a left interlaminar approach. Clear colorless CSF spontaneously returned, with opening pressure of 28 cm water measured in prone position due to mobility concerns. 13 ml CSF were collected and divided among 4 sterile vials for the requested laboratory studies. Closing pressure 25 cm water. The needle was then removed. The patient tolerated the procedure well and there were no complications. FLUOROSCOPY: Radiation Exposure Index (as provided by the fluoroscopic device): 16.0 mGy Kerma IMPRESSION: Technically successful lumbar puncture under fluoroscopy. This exam was performed by Brittany Huneycutt, NP, and was supervised and interpreted by Dr. Ree Molt. Electronically Signed   By: Ree Molt M.D.   On: 10/10/2024 14:07   Overnight EEG with video Result Date: 10/10/2024 Shelton Arlin KIDD, MD     10/10/2024  2:29 PM Patient Name: JEMARCUS DOUGAL MRN: [id number] Epilepsy Attending: Arlin KIDD Shelton Referring Physician/Provider: Khaliqdina, Salman, MD Duration: 10/09/2024 0957 to 10/10/2024 1006 Patient history:  73 y.o. 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 EEG to evaluate for seizure  Level of alertness: Awake, asleep AEDs during EEG study: LEV, GBP Technical aspects: This EEG study was done with scalp electrodes positioned according to the 10-20 International system of electrode placement. Electrical activity  was reviewed with band pass filter of 1-70Hz , sensitivity of 7 uV/mm, display speed of 79mm/sec with a 60Hz  notched filter applied as appropriate. EEG data were recorded continuously and digitally stored.  Video monitoring was available and reviewed as appropriate. Description: The posterior dominant rhythm consists of 8 Hz activity of moderate voltage (25-35 uV) seen predominantly in posterior head regions, symmetric and reactive to eye opening and eye closing. Sleep was characterized by vertex waves, sleep spindles (12 to 14 Hz), maximal frontocentral region. There is intermittent generalized 3 to 6 Hz theta-delta slowing. Hyperventilation and photic stimulation were not performed.   ABNORMALITY - Intermittent slow, generalized IMPRESSION: This study is suggestive of generalized cerebral dysfunction ( encephalopathy). No seizures or epileptiform discharges were seen throughout the recording. Priyanka O Yadav     Scheduled Meds:  amLODipine   10 mg Oral Daily   Chlorhexidine  Gluconate Cloth  6 each Topical  Daily   gabapentin   200 mg Oral TID   levETIRAcetam   500 mg Intravenous Q12H   multivitamin with minerals  1 tablet Oral Daily   pantoprazole   40 mg Oral Daily   sodium chloride  flush  10-40 mL Intracatheter Q12H   sodium chloride  flush  10-40 mL Intracatheter Q12H   Continuous Infusions:  lactated ringers      methylPREDNISolone  (SOLU-MEDROL ) injection 1,000 mg (10/10/24 2106)     LOS: 2 days   Fredia Skeeter, MD [facility]  10/11/2024, 7:57 AM   *Please note that this is a verbal dictation therefore any spelling or grammatical errors are due to the Dragon Medical One system interpretation.  Please page via Amion and do not message via secure chat for urgent patient care matters. Secure chat can be used for non urgent patient care matters.  How to contact the TRH Attending or Consulting provider 7A - 7P or covering provider during after hours 7P -7A, for this patient?  Check  the care team in [facility] and look for a) attending/consulting TRH provider listed and b) the TRH team listed. Page or secure chat 7A-7P. Log into [email or web address] and use [place]'s universal password to access. If you do not have the password, please contact the hospital operator. Locate the TR[facility] provider you are looking for under [facility] and page to a number that you can be directly reached. If you still have difficulty reaching the provider, please page the [facility] (Director on Call) for the Hospitalists listed on amion for assistance.

## 2024-10-11 NOTE — Progress Notes (Signed)
 NEUROLOGY CONSULT FOLLOW UP NOTE   Date of service: October 11, 2024 Patient Name: Jorge [name] MRN:  [id number] DOB:  [birth year]  Interval Hx/subjective   No issues with solumedrol.  Vitals   Vitals:   10/10/24 2332 10/11/24 0341 10/11/24 0400 10/11/24 0900  BP: (!) 149/97  125/74 (!) 151/98  Pulse: 82   79  Resp: 18  20 19   Temp: 98 F (36.7 C)  98 F (36.7 C) (!) 97.5 F (36.4 C)  TempSrc: Oral  Oral Oral  SpO2: 99%  99% 99%  Weight:  62 kg    Height:         Body mass index is 18.54 kg/m.  Physical Exam   Constitutional: Thin appearing, well-developed elderly patient in no acute distress Psych: Affect appropriate to situation.  Eyes: No scleral injection.  HENT: No OP obstrucion.  Head: Normocephalic.  Cardiovascular: Normal rate and regular rhythm.  Respiratory: Effort normal, non-labored breathing.  Skin: WDI.   Neurologic Examination    NEURO:  Mental Status: AA&Ox3, able to give some history of present illness Speech/Language: speech is without dysarthria or aphasia.  Naming,  fluency, and comprehension intact.  Cranial Nerves:  II: PERRL. Visual fields full.  III, IV, VI: EOMI. Eyelids elevate symmetrically.  V: Sensation is intact to light touch and symmetrical to face.  VII: Smile is symmetrical.  VIII: hearing intact to voice. IX, X: Phonation is normal.  XII: tongue is midline without fasciculations. Motor: 5/5 strength to all muscle groups tested.  Tone: is normal and bulk is normal Sensation- Intact to light touch bilaterally.  Coordination: FTN intact bilaterally Gait- deferred   Medications  Current Facility-Administered Medications:    acetaminophen  (TYLENOL ) tablet 500 mg, 500 mg, Oral, Q6H PRN, Shona Terry SAILOR, DO, 500 mg at 10/11/24 0047   amLODipine  (NORVASC ) tablet 10 mg, 10 mg, Oral, Daily, Hall, Carole N, DO, 10 mg at 10/11/24 0905   [START ON 10/12/2024] calcium -vitamin D (OSCAL WITH D) 500-5 MG-MCG per tablet 2 tablet, 2  tablet, Oral, Q breakfast, Corrie Brannen, MD   Chlorhexidine  Gluconate Cloth 2 % PADS 6 each, 6 each, Topical, Daily, Shona Terry SAILOR, DO, 6 each at 10/10/24 9161   gabapentin  (NEURONTIN ) capsule 200 mg, 200 mg, Oral, TID, Rizwan, Saima, MD, 200 mg at 10/11/24 9094   lactated ringers  bolus 1,000 mL, 1,000 mL, Intravenous, Once, Rogelia Jerilynn RAMAN, MD   levETIRAcetam  (KEPPRA ) undiluted injection 500 mg, 500 mg, Intravenous, Q12H, de Clint Kill, Wellington E, NP, 500 mg at 10/11/24 9094   loperamide  (IMODIUM ) capsule 2 mg, 2 mg, Oral, PRN, Pahwani, Ravi, MD, 2 mg at 10/11/24 0616   melatonin tablet 5 mg, 5 mg, Oral, QHS PRN, Shona Terry N, DO, 5 mg at 10/10/24 2106   methylPREDNISolone  sodium succinate (SOLU-MEDROL ) 1,000 mg in sodium chloride  0.9 % 50 mL IVPB, 1,000 mg, Intravenous, Q24H, Last Rate: 66 mL/hr at 10/10/24 2106, 1,000 mg at 10/10/24 2106 **AND** [DISCONTINUED] pantoprazole  (PROTONIX ) injection 40 mg, 40 mg, Intravenous, Q24H, Annia Gomm, MD   multivitamin with minerals tablet 1 tablet, 1 tablet, Oral, Daily, Hall, Carole N, DO, 1 tablet at 10/11/24 9094   pantoprazole  (PROTONIX ) EC tablet 40 mg, 40 mg, Oral, Daily, Bekah Igoe, MD, 40 mg at 10/11/24 0905   polyethylene glycol (MIRALAX  / GLYCOLAX ) packet 17 g, 17 g, Oral, Daily PRN, Hall, Carole N, DO   prochlorperazine  (COMPAZINE ) injection 5 mg, 5 mg, Intravenous, Q6H PRN, Shona Terry SAILOR, DO  sodium chloride  flush (NS) 0.9 % injection 10-40 mL, 10-40 mL, Intracatheter, Q12H, Hall, Carole N, DO, 10 mL at 10/11/24 0906   sodium chloride  flush (NS) 0.9 % injection 10-40 mL, 10-40 mL, Intracatheter, PRN, Shona Laurence N, DO   sodium chloride  flush (NS) 0.9 % injection 10-40 mL, 10-40 mL, Intracatheter, Q12H, Pahwani, Ravi, MD, 10 mL at 10/11/24 9094   sodium chloride  flush (NS) 0.9 % injection 10-40 mL, 10-40 mL, Intracatheter, PRN, Vernon Ranks, MD  Labs and Diagnostic Imaging   CBC:  Recent Labs  Lab 10/07/24 1107  10/10/24 0932  WBC 4.0 3.8*  NEUTROABS  --  2.4  HGB 18.2* 12.8*  HCT 57.1* 39.8  MCV 94.7 94.3  PLT 277 192    Basic Metabolic Panel:  Lab Results  Component Value Date   NA 135 10/11/2024   K 4.5 10/11/2024   CO2 27 10/11/2024   GLUCOSE 162 (H) 10/11/2024   BUN 13 10/11/2024   CREATININE 0.96 10/11/2024   CALCIUM  8.0 (L) 10/11/2024   GFRNONAA >60 10/11/2024   Lipid Panel: No results found for: LDLCALC HgbA1c: No results found for: HGBA1C Urine Drug Screen: No results found for: LABOPIA, COCAINSCRNUR, LABBENZ, AMPHETMU, THCU, LABBARB  Alcohol Level No results found for: [facility] INR  Lab Results  Component Value Date   INR 1.1 10/09/2024   APTT  Lab Results  Component Value Date   APTT 30 10/09/2024    CT Head without contrast(Personally reviewed): New left parietal hypoattenuation, no other acute abnormality  MRI Brain(Personally reviewed): No acute or subacute infarct or definite intracranial metastatic disease, innumerable chronic cerebral microhemorrhages suggestive of cerebral amyloid angiopathy, patchy subcortical T2 hyperintensities bilaterally as well as more confluent edema type signal in the posterior left cerebral hemisphere suspicious for inflammatory cerebral amyloid disease  Continuous EEG 12/4-12/5:  - Intermittent slow, generalized This study is suggestive of generalized cerebral dysfunction ( encephalopathy). No seizures or epileptiform discharges were seen throughout the recording.   Latest Reference Range & Units 10/10/24 12:31 10/10/24 12:33 10/10/24 12:35  Appearance, CSF CLEAR  CLEAR  CLEAR  Glucose, CSF 40 - 70 mg/dL  64   RBC Count, CSF 0 /cu mm 2 (H)  1 (H)  WBC, CSF 0 - 5 /cu mm 1  1  Other Cells, CSF  TOO FEW TO COUNT, SMEAR AVAILABLE FOR REVIEW  TOO FEW TO COUNT, SMEAR AVAILABLE FOR REVIEW  Color, CSF COLORLESS  COLORLESS  COLORLESS  Supernatant  NOT INDICATED  NOT INDICATED  Total  Protein, CSF 15 - 45 mg/dL  81 (H)    Tube #  1  4  (H): Data is abnormally high   CSF meningitis/encephalitis panel: Cryptococcus neoformans/gattii (CSF) NOT DETECTED  Comment: (NOTE) Patients with a suspicion of cryptococcal meningitis should be tested for cryptococcal antigen (CrAg).   Cytomegalovirus (CSF) NOT DETECTED  Enterovirus (CSF) NOT DETECTED  Escherichia coli K1 (CSF) NOT DETECTED  Comment: (NOTE) Only E. coli strains possessing the K1 capsular antigen will be detected.   Haemophilus influenzae (CSF) NOT DETECTED  Herpes simplex virus 1 (CSF) NOT DETECTED  Herpes simplex virus 2 (CSF) NOT DETECTED  Human herpesvirus 6 (CSF) NOT DETECTED  Human parechovirus (CSF) NOT DETECTED  Listeria monocytogenes (CSF) NOT DETECTED  Neisseria meningitis (CSF) NOT DETECTED  Comment: (NOTE) Only encapsulated strains of N. meningitidis will be detected.   Streptococcus agalactiae (CSF) NOT DETECTED  Streptococcus pneumoniae (CSF) NOT DETECTED  Varicella zoster virus (CSF) NOT DETECTED  Assessment   MURAT RIDEOUT is a 73 y.o. 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    Initially came in with expressive worse than receptive aphasia which resolved immediately after Keppra  load was given. LTM EEG demonstrated no seizure or epileptiform discharges and was discontinued.    LP not concerning for infection. 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  Recommendations  - Continue Keppra  500mg  BID - Solumedrol 1000mg  IV daily x 3 doses, then PO  50mg  daily x 4 weeks, then 40mg  x1 week, 30mg  x 1 week, then 20mg  x 1 week, then 10mg  daily. Recommend repeat MRI Brain w/o contrast in 4 weeks. - PCP prophylaxis while on Solumedrol along with PPI and Vit D with calcium . - Will need to follow up closely with stroke neurologist outpatient in 4-6 weeks and get repeat MRI Brain with and without contrast outpatient. - He can be discharged tomorrow after his last dose of IV solumedrol and be placed on taper listed above. - Reasonable to attempt to wean off Keppra  outpatient after MRI brain and once CAARI has been adequately treated. - CSF and serum paraneoplatic panel is pending and cane be followed up outpatient by neurology. ______________________________________________________________________  Plan discussed with Dr. Vernon with the Hospitalist team.  Lonya Johannesen, MD [facility] [phone]   If 7pm to 7am, please call on call as listed on AMION.

## 2024-10-12 ENCOUNTER — Other Ambulatory Visit (HOSPITAL_COMMUNITY): Payer: Self-pay

## 2024-10-12 LAB — BASIC METABOLIC PANEL WITH GFR
Anion gap: 6 (ref 5–15)
BUN: 13 mg/dL (ref 8–23)
CO2: 27 mmol/L (ref 22–32)
Calcium: 7.9 mg/dL — ABNORMAL LOW (ref 8.9–10.3)
Chloride: 102 mmol/L (ref 98–111)
Creatinine, Ser: 0.9 mg/dL (ref 0.61–1.24)
GFR, Estimated: >60 mL/min (ref >60)
Glucose, Bld: 156 mg/dL — ABNORMAL HIGH (ref 70–99)
Potassium: 4.2 mmol/L (ref 3.5–5.1)
Sodium: 135 mmol/L (ref 135–145)

## 2024-10-12 MED ORDER — PREDNISONE 10 MG PO TABS
ORAL_TABLET | ORAL | 0 refills | Status: AC
  Filled 2024-10-12: qty 225, 71d supply, fill #0

## 2024-10-12 MED ORDER — LEVETIRACETAM 500 MG PO TABS
500.0000 mg | ORAL_TABLET | Freq: Two times a day (BID) | ORAL | 0 refills | Status: AC
  Filled 2024-10-12: qty 60, 30d supply, fill #0

## 2024-10-12 MED ORDER — PANTOPRAZOLE SODIUM 40 MG PO TBEC
40.0000 mg | DELAYED_RELEASE_TABLET | Freq: Every day | ORAL | 0 refills | Status: AC
  Filled 2024-10-12: qty 90, 90d supply, fill #0

## 2024-10-12 MED ORDER — SULFAMETHOXAZOLE-TRIMETHOPRIM 400-80 MG PO TABS
1.0000 | ORAL_TABLET | Freq: Every day | ORAL | 0 refills | Status: AC
  Filled 2024-10-12: qty 30, 30d supply, fill #0

## 2024-10-12 MED ORDER — ACETAMINOPHEN 500 MG PO TABS
ORAL_TABLET | ORAL | Status: AC
  Administered 2024-10-12: 500 mg
  Filled 2024-10-12: qty 1

## 2024-10-12 MED ORDER — HEPARIN SOD (PORK) LOCK FLUSH 100 UNIT/ML IV SOLN
500.0000 [IU] | INTRAVENOUS | Status: AC | PRN
  Administered 2024-10-12: 500 [IU]

## 2024-10-12 MED ORDER — OYSTER SHELL CALCIUM/D3 500-5 MG-MCG PO TABS
2.0000 | ORAL_TABLET | Freq: Every day | ORAL | 0 refills | Status: AC
  Filled 2024-10-12: qty 180, 90d supply, fill #0

## 2024-10-12 MED ORDER — ACETAMINOPHEN 500 MG PO TABS: 500.0000 mg | ORAL_TABLET | Freq: Four times a day (QID) | ORAL | Status: AC | PRN

## 2024-10-12 NOTE — Discharge Summary (Addendum)
 Physician Discharge Summary  Jorge [name] FMW:[id number] DOB: 03-22-1951 DOA: 10/07/2024  PCP: Petrina Pries, NP  Admit date: 10/07/2024 Discharge date: 10/12/2024  Admitted from: Home Discharge disposition: Home  Recommendations at discharge:  Per neurology recommendation, you have been started on a tapering course of prednisone  with 50mg  daily x 4 weeks, then 40mg  daily x1 week, 30mg  daily x 1 week, then 20mg  daily x 1 week, then 10mg  daily. While on a long course of steroids, you have been started on PCP prophylaxis with Bactrim  daily as well as PPI and vitamin D with calcium  You have also been started on Keppra  500 mg twice daily. Recommended to repeat MRI in 4 weeks and hopefully wean off Keppra  at that time. Follow-up with neurology as an outpatient Continue to follow-up with oncology as an outpatient   Subjective: Patient was seen and examined this morning. Pleasant elderly African-American male.  Lying in bed.  Not in distress.  Granddaughter at bedside. Afebrile, hemodynamically stable Labs this morning with potassium level normal  Brief narrative: Jorge [name] is a 73 y.o. male with PMH significant for metastatic colon cancer on chemotherapy with lung and liver involvement on nivolumab  and Cabometyx , hypertension, chronic HFpEF, smoker  12/2, patient was brought to ED at [facility] for right-sided hip pain, trouble walking and numerous falls over the past several days.   Also noted to be more confused and having trouble remembering things.  In the ED he was found to have 4/5 weakness in the right lower extremity.  Also noted to have expressive worse than receptive aphasia. CT of the head which revealed a new left parietal hypoattenuation. MRI of the brain was performed which revealed: Patchy subcortical hyperintensities bilaterally as well as more confluent edema type signal in the posterior left cerebral hemisphere, innumerable chronic cerebral microhemorrhages  suggestive of cerebral amyloid angiopathy CT of the pelvis was also performed which was unremarkable CT of the C-spine revealed an irregular mass in the right lung apex which was partially visualized but decreased in size from prior imaging on 07/01/2024.  Admitted to [facility] Neurology was consulted Transferred to [facility] course: Cerebral amyloid angiopathy Presented with gradually worsening neurological symptoms Imagings as above including MRI which suggested cerebral amyloid angiopathy Seen by neurology EEG negative for epileptiform activity Lumbar puncture not concerning for infection,  pending CSF and serum paraneoplastic panel Per neurology recommendation treated with IV Solu-Medrol  1 g daily for 3 doses with a plan to taper. Patient was also loaded with Keppra  and continued on 500 mg twice daily Recommended to repeat MRI in 4 weeks and hopefully wean off Keppra  at that time Plan to discharge home today on Keppra  twice daily, tapering course of prednisone : 50mg  daily x 4 weeks, then 40mg  daily x1 week, 30mg  daily x 1 week, then 20mg  daily x 1 week, then 10mg  daily. 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 Restaging CT on 07/01/2024 showed disease progression Currently being managed by Dr. Ileana with cabozantinib .  Oncology following.   Right hip pain CT of the pelvis was performed which was unremarkable Seen by PT OT  Recommend outpatient PT   Hypertension Blood pressure controlled on amlodipine    Peripheral neuropathy Continue gabapentin    Chronic diarrhea Patient has chronic diarrhea and takes Imodium  at home.  Continue Imodium  as needed   Subclinical hypothyroidism Elevated TSH  but T3-T4 normal.   Recommend repeating TSH in 6 to 8 weeks.    Mobility: Seen by PT.  Outpatient PT recommended  Goals of care   Code  Status: Limited: Do not attempt resuscitation (DNR) -DNR-LIMITED -Do Not Intubate/DNI    Diet:  Diet Order             Diet general           Diet regular Room service appropriate? Yes; Fluid consistency: Thin  Diet effective now                   Nutritional status:  Body mass index is 18.54 kg/m.       Wounds:  -    Discharge Medications:   Allergies as of 10/12/2024   No Known Allergies      Medication List     TAKE these medications    acetaminophen  500 MG tablet Commonly known as: TYLENOL  Take 1 tablet (500 mg total) by mouth every 6 (six) hours as needed for mild pain (pain score 1-3), fever or headache.   amLODipine  10 MG tablet Commonly known as: NORVASC  TAKE 1 TABLET BY MOUTH EVERY DAY   Cabometyx  40 MG tablet Generic drug: cabozantinib  Take 1 tablet (40 mg total) by mouth daily. Take on an empty stomach, 1 hour before or 2 hours after meals.   calcium -vitamin D 500-5 MG-MCG tablet Commonly known as: OSCAL WITH D Take 2 tablets by mouth daily with breakfast.   diphenoxylate -atropine  2.5-0.025 MG tablet Commonly known as: LOMOTIL  Take 2 tablets by mouth 4 (four) times daily as needed for diarrhea or loose stools.   gabapentin  100 MG capsule Commonly known as: NEURONTIN  TAKE 2 CAPSULES BY MOUTH 3 TIMES A DAY   levETIRAcetam  500 MG tablet Commonly known as: Keppra  Take 1 tablet (500 mg total) by mouth 2 (two) times daily.   multivitamin with minerals Tabs tablet Take 1 tablet by mouth daily.   nivolumab  480 mg in sodium chloride  0.9 % 100 mL Inject 480 mg into the vein every 30 (thirty) days.   oxyCODONE  5 MG immediate release tablet Commonly known as: Oxy IR/ROXICODONE  Take 1 tablet (5 mg total) by mouth every 6 (six) hours as needed for severe pain (pain score 7-10) or breakthrough pain.   pantoprazole  40 MG tablet Commonly known as: PROTONIX  Take 1 tablet (40 mg total) by mouth daily.   predniSONE  10 MG tablet Commonly known  as: DELTASONE  Take 50mg  daily x 4 weeks, then 40mg  daily x1 week, 30mg  daily x 1 week, then 20mg  daily x 1 week, then 10mg  daily   sulfamethoxazole -trimethoprim  400-80 MG tablet Commonly known as: BACTRIM  Take 1 tablet by mouth daily.               Discharge Care Instructions  (From admission, onward)           Start     Ordered   10/12/24 0000  Discharge wound care:        10/12/24 9187             Follow ups:    Follow-up Information     [facility] Follow up.   Specialty: Rehabilitation Why: call and schedule if you do not hear from them Contact information: [address] [place]  [address] [phone]        Petrina Pries, NP Follow up.   Specialty: Family Medicine Contact information: [address] [place]  [place] [address] [phone]                 Discharge Instructions:   Discharge Instructions     Ambulatory referral to Physical Therapy   Complete by: As directed    PT  evaluate and treat- colon CA left sided weakness cerebral amaloid   Call MD for:  difficulty breathing, headache or visual disturbances   Complete by: As directed    Call MD for:  extreme fatigue   Complete by: As directed    Call MD for:  hives   Complete by: As directed    Call MD for:  persistant dizziness or light-headedness   Complete by: As directed    Call MD for:  persistant nausea and vomiting   Complete by: As directed    Call MD for:  severe uncontrolled pain   Complete by: As directed    Call MD for:  temperature >100.4   Complete by: As directed    Diet general   Complete by: As directed    Discharge instructions   Complete by: As directed    Recommendations at discharge:   Per neurology recommendation, you have been started on a tapering course of prednisone  with 50mg  daily x 4 weeks, then 40mg  daily x1 week, 30mg  daily x 1 week, then 20mg  daily x 1 week, then 10mg   daily  While on a long course of steroids, you have been started on PCP prophylaxis with Bactrim  daily as well as PPI and vitamin D with calcium   You have also been started on Keppra  500 mg twice daily.  Recommended to repeat MRI in 4 weeks and hopefully wean off Keppra  at that time.  Follow-up with neurology as an outpatient  Continue to follow-up with oncology as an outpatient  Seizure precautions: -Per [place] statutes, patients with seizures are not allowed to drive until they have been seizure-free for six months.  -Use caution when using heavy equipment or power tools.  -Avoid working on ladders or at heights.  -Take showers instead of baths. Ensure the water temperature is not too high on the home water heater.  -Do not go swimming alone.  -Do not lock yourself in a room alone (i.e. bathroom). -When caring for infants or small children, sit down when holding, feeding, or changing them to minimize risk of injury to the child in the event you have a seizure.  -Maintain good sleep hygiene. -Avoid alcohol.    If patient has another seizure, call 911 and bring them back to the ED if: A.  The seizure lasts longer than 5 minutes.      B.  The patient doesn't wake shortly after the seizure or has new problems such as difficulty seeing, speaking or moving following the seizure C.  The patient was injured during the seizure D.  The patient has a temperature over 102 F (39C) E.  The patient vomited during the seizure and now is having trouble breathing      General discharge instructions: Follow with Primary MD Petrina Pries, NP in 7 days  Please request your PCP  to go over your hospital tests, procedures, radiology results at the follow up. Please get your medicines reviewed and adjusted.  Your PCP may decide to repeat certain labs or tests as needed. Do not drive, operate heavy machinery, perform activities at heights, swimming or participation in water activities or  provide baby sitting services if your were admitted for syncope or siezures until  you have seen by Primary MD or a Neurologist and advised to do so again. [place]  Controlled Substance Reporting System database was reviewed. Do not drive, operate heavy machinery, perform activities at heights, swim, participate in water activities or provide baby-sitting services while on medications for pain, sleep and mood until your outpatient physician has reevaluated you and advised to do so again.  You are strongly recommended to comply with the dose, frequency and duration of prescribed medications. Activity: As tolerated with Full fall precautions use walker/cane & assistance as needed Avoid using any recreational substances like cigarette, tobacco, alcohol, or non-prescribed drug. If you experience worsening of your admission symptoms, develop shortness of breath, life threatening emergency, suicidal or homicidal thoughts you must seek medical attention immediately by calling 911 or calling your MD immediately  if symptoms less severe. You must read complete instructions/literature along with all the possible adverse reactions/side effects for all the medicines you take and that have been prescribed to you. Take any new medicine only after you have completely understood and accepted all the possible adverse reactions/side effects.  Wear Seat belts while driving. You were cared for by a hospitalist during your hospital stay. If you have any questions about your discharge medications or the care you received while you were in the hospital after you are discharged, you can call the unit and ask to speak with the hospitalist or the covering physician. Once you are discharged, your primary care physician will handle any further medical issues. Please note that NO REFILLS for any discharge medications will be authorized once you are discharged, as it is imperative that you return to your primary care physician (or  establish a relationship with a primary care physician if you do not have one).   Discharge wound care:   Complete by: As directed    Increase activity slowly   Complete by: As directed        Discharge Exam:   Vitals:   10/11/24 1530 10/11/24 2030 10/12/24 0743 10/12/24 1155  BP: (!) 151/96 (!) 147/84 (!) 145/96 (!) 147/82  Pulse: 74 77 75 79  Resp:  18 18 16   Temp: 97.8 F (36.6 C) 97.7 F (36.5 C) (!) 97.4 F (36.3 C) 98.1 F (36.7 C)  TempSrc: Oral Oral Oral Oral  SpO2: 100% 98% 100% 99%  Weight:      Height:        Body mass index is 18.54 kg/m.  General exam: Pleasant, elderly African-American male.  Not in distress Skin: No rashes, lesions or ulcers. HEENT: Atraumatic, normocephalic, no obvious bleeding Lungs: Clear to auscultation bilaterally,  CVS: S1, S2, no murmur,   GI/Abd: Soft, nontender, nondistended, bowel sound present,   CNS: Alert, awake, oriented x 3 Psychiatry: Mood appropriate Extremities: No pedal edema, no calf tenderness,    The results of significant diagnostics from this hospitalization (including imaging, microbiology, ancillary and laboratory) are listed below for reference.    Procedures and Diagnostic Studies:   MR Brain W and Wo Contrast Result Date: 10/07/2024 EXAM: MRI BRAIN WITH AND WITHOUT CONTRAST 10/07/2024 05:06:31 PM TECHNIQUE: Multiplanar multisequence MRI of the head/brain was performed with and without the administration of intravenous contrast. COMPARISON: Head CT 10/07/2024. CLINICAL HISTORY: Mental status change, unknown cause; eval for L parietal stroke and possible brain mets. FINDINGS: BRAIN AND VENTRICLES: No acute or subacute infarct, mass, midline shift, hydrocephalus, or extra axial fluid collection is identified. There are innumerable chronic microhemorrhages throughout both cerebral hemispheres with near  complete sparing of the deep gray nuclei and cerebellum. Confluent T2 hyperintensity/mild edema is present in the  subcortical white matter of the left parietal and occipital lobes, also likely with involvement of overlying cortex. Milder patchy subcortical T2 hyperintensities are present elsewhere bilaterally, including in the right occipital and posterior temporal lobes. No definite abnormal enhancement is identified within limitations of motion artifact, although minimal subtle leptomeningeal enhancement in the left parietal region is questioned. Mild cerebral atrophy is within normal limits for age. Major intracranial vascular flow voids are preserved. ORBITS: No acute abnormality. SINUSES: Mild mucosal thickening in the paranasal sinuses. Large bilateral mastoid effusions. BONES AND SOFT TISSUES: Normal bone marrow signal and enhancement. No acute soft tissue abnormality. IMPRESSION: 1. No acute/subacute infarct or definite intracranial metastatic disease on this motion-degraded study. 2. Innumerable chronic cerebral microhemorrhages suggestive of cerebral amyloid angiopathy. 3. Patchy subcortical T2 hyperintensities bilaterally as well as more confluent edema-type signal in the posterior left cerebral hemisphere, nonspecific but suspicious for inflammatory cerebral amyloid disease. Other considerations include posterior reversible encephalopathy syndrome and encephalitis. Electronically signed by: Dasie Hamburg MD 10/07/2024 07:06 PM EST RP Workstation: [id number]   CT Head Wo Contrast Result Date: 10/07/2024 EXAM: CT HEAD WITHOUT CONTRAST 10/07/2024 02:13:21 PM TECHNIQUE: CT of the head was performed without the administration of intravenous contrast. Automated exposure control, iterative reconstruction, and/or weight based adjustment of the mA/kV was utilized to reduce the radiation dose to as low as reasonably achievable. COMPARISON: CT head 11/24/2023. CLINICAL HISTORY: Head trauma, minor (Age >= 65y). FINDINGS: BRAIN AND VENTRICLES: No acute hemorrhage. There is transcortical hypoattenuation involving the left  parietal lobe, new since prior exam. Overall similar background mild scattered white matter hypodensities, which are nonspecific but most commonly represent chronic microvascular ischemic changes. No hydrocephalus. No extra-axial collection. No mass effect or midline shift. ORBITS: No acute abnormality. SINUSES: Moderate left mastoid effusion and left middle ear opacification, increased since prior exam. Increased mild right mastoid effusion. Decreased paranasal sinus inflammatory mucosal thickening SOFT TISSUES AND SKULL: No acute soft tissue abnormality. No skull fracture. IMPRESSION: 1. Since 11/24/2023, new left parietal hypoattenuation may represent recent ischemic infarction. Recommend MRI brain with IV contrast for further evaluation. 2. No acute intracranial hemorrhage. Electronically signed by: prentice spade 10/07/2024 02:55 PM EST RP Workstation: [id number]   CT PELVIS WO CONTRAST Result Date: 10/07/2024 CLINICAL DATA:  Suspect hip fracture. History of metastatic rectal carcinoma. Fall with right hip pain. EXAM: CT PELVIS WITHOUT CONTRAST TECHNIQUE: Multidetector CT imaging of the pelvis was performed following the standard protocol without intravenous contrast. RADIATION DOSE REDUCTION: This exam was performed according to the departmental dose-optimization program which includes automated exposure control, adjustment of the mA and/or kV according to patient size and/or use of iterative reconstruction technique. COMPARISON:  CT 07/01/2024 and plain x-ray right hip 10/07/2024 FINDINGS: Urinary Tract:  No abnormality visualized. Bowel: Possible small bowel loop in the left lower quadrant at the upper limits of normal in diameter. Bowel is otherwise unremarkable. Vascular/Lymphatic: Calcified plaque over the abdominal aorta which is normal caliber. Mild calcified plaque over the external iliac arteries. No adenopathy. Reproductive:  No mass or other significant abnormality Other: No free fluid over the  lower abdomen/pelvis. Moderate cholelithiasis present. Musculoskeletal: No acute fracture or dislocation. Minimal degenerative change of the hips. Mild degenerate change of the spine. IMPRESSION: 1. No acute fracture or dislocation. 2. Moderate cholelithiasis. 3. Aortic atherosclerosis. Aortic Atherosclerosis (ICD10-I70.0). Electronically Signed   By: Toribio Agreste M.D.   On:  10/07/2024 14:40   CT Cervical Spine Wo Contrast Result Date: 10/07/2024 EXAM: CT CERVICAL SPINE WITHOUT CONTRAST 10/07/2024 02:13:21 PM TECHNIQUE: CT of the cervical spine was performed without the administration of intravenous contrast. Multiplanar reformatted images are provided for review. Automated exposure control, iterative reconstruction, and/or weight based adjustment of the mA/kV was utilized to reduce the radiation dose to as low as reasonably achievable. COMPARISON: None available. CLINICAL HISTORY: Neck trauma (Age >= 65y) FINDINGS: CERVICAL SPINE: BONES AND ALIGNMENT: No acute fracture or traumatic malalignment. DEGENERATIVE CHANGES: Early degenerative disc and facet disease bilaterally. SOFT TISSUES: No prevertebral soft tissue swelling. LUNGS: Partially visualized in the anterior right upper lobe of the chest is a 2.2 x 1.4 cm irregular masslike area in the right apex. This was noted on prior chest CT from 07/01/2024 when this area measured 2.5 x 1.9 cm. Recommend continued attention on follow-up chest imaging. IMPRESSION: 1. No acute abnormality of the cervical spine. 2. Partially visualized 2.2 x 1.4 cm irregular mass in the right lung apex, decreased in size from 2.5 x 1.9 cm on 07/01/2024; recommend continued follow-up chest imaging. Electronically signed by: Franky Crease MD 10/07/2024 02:37 PM EST RP Workstation: [id number]   DG Hip Unilat  With Pelvis 2-3 Views Right Result Date: 10/07/2024 EXAM: 2 VIEW(S) XRAY OF THE UNILATERAL HIP 10/07/2024 11:36:00 AM COMPARISON: Pelvic CT of 07/01/2024. CLINICAL HISTORY:  Fall/Mobility issues. FINDINGS: BONES AND JOINTS: No acute fracture or focal osseous lesion. Mild bilateral hip joint space narrowing and subchondral sclerosis are mild and relatively symmetric, consistent with mild bilateral hip osteoarthritis. SOFT TISSUES: Presumed vascular calcifications within the pelvis. INCIDENTAL FINDINGS: Gallstones. IMPRESSION: 1. No acute findings. 2. Mild bilateral hip osteoarthritis. 3. Cholelithiasis Electronically signed by: Rockey Kilts MD 10/07/2024 12:05 PM EST RP Workstation: [id number]     Labs:   Basic Metabolic Panel: Recent Labs  Lab 10/07/24 1107 10/10/24 0932 10/11/24 0600 10/12/24 0531  NA 139 137 135 135  K 4.0 3.3* 4.5 4.2  CL 98 103 102 102  CO2 27 30 27 27   GLUCOSE 97 144* 162* 156*  BUN 9 11 13 13   CREATININE 0.79 0.92 0.96 0.90  CALCIUM  9.8 8.0* 8.0* 7.9*   GFR Estimated Creatinine Clearance: 64.1 mL/min (by C-G formula based on SCr of 0.9 mg/dL). Liver Function Tests: Recent Labs  Lab 10/07/24 1107 10/10/24 0932 10/11/24 0600  AST 87* 44* 44*  ALT 59* 33 34  ALKPHOS 192* 123 123  BILITOT 0.7 0.6 0.9  PROT 8.7* 5.7* 6.0*  ALBUMIN 3.8 2.0* 2.2*   No results for input(s): LIPASE, AMYLASE in the last 168 hours. Recent Labs  Lab 10/07/24 1615  AMMONIA 47*   Coagulation profile Recent Labs  Lab 10/09/24 0500  INR 1.1    CBC: Recent Labs  Lab 10/07/24 1107 10/10/24 0932  WBC 4.0 3.8*  NEUTROABS  --  2.4  HGB 18.2* 12.8*  HCT 57.1* 39.8  MCV 94.7 94.3  PLT 277 192   Cardiac Enzymes: No results for input(s): CKTOTAL, CKMB, CKMBINDEX, TROPONINI in the last 168 hours. BNP: Invalid input(s): POCBNP CBG: No results for input(s): GLUCAP in the last 168 hours. D-Dimer No results for input(s): DDIMER in the last 72 hours. Hgb A1c No results for input(s): HGBA1C in the last 72 hours. Lipid Profile No results for input(s): CHOL, HDL, LDLCALC, TRIG, CHOLHDL, LDLDIRECT in the last 72  hours. Thyroid  function studies No results for input(s): TSH, T4TOTAL, T3FREE, THYROIDAB in the last 72 hours.  Invalid input(s): FREET3 Anemia work up No results for input(s): VITAMINB12, FOLATE, FERRITIN, TIBC, IRON, RETICCTPCT in the last 72 hours. Microbiology Recent Results (from the past 240 hours)  Urine Culture     Status: None   Collection Time: 10/07/24  1:03 PM   Specimen: Urine, Clean Catch  Result Value Ref Range Status   Specimen Description   Final    URINE, CLEAN CATCH Performed at [facility], [address], [place], [place] [address]    Special Requests   Final    NONE Performed at [facility], [address] [place], [place] [address]    Culture   Final    NO GROWTH Performed at [facility], [address], [place], [place] [address]    Report Status 10/08/2024 FINAL  Final    Time coordinating discharge: 45 minutes  Signed: Lorena Benham  [facility] 10/12/2024, 2:45 PM

## 2024-10-12 NOTE — Plan of Care (Signed)

## 2024-10-13 ENCOUNTER — Telehealth: Payer: Self-pay

## 2024-10-13 ENCOUNTER — Other Ambulatory Visit: Payer: Self-pay | Admitting: Nurse Practitioner

## 2024-10-13 DIAGNOSIS — Z515 Encounter for palliative care: Secondary | ICD-10-CM

## 2024-10-13 DIAGNOSIS — C20 Malignant neoplasm of rectum: Secondary | ICD-10-CM

## 2024-10-13 DIAGNOSIS — G893 Neoplasm related pain (acute) (chronic): Secondary | ICD-10-CM

## 2024-10-13 MED ORDER — OXYCODONE HCL 5 MG PO TABS: 5.0000 mg | ORAL_TABLET | Freq: Four times a day (QID) | ORAL | 0 refills | Status: DC | PRN

## 2024-10-13 NOTE — Telephone Encounter (Signed)
 Please see message from patient's daughter regarding continuing with oral chemo. I have sent in his pain medications to be picked up when due (minus hospitalization days). Thanks

## 2024-10-13 NOTE — Transitions of Care (Post Inpatient/ED Visit) (Signed)
   10/13/2024  Name: AVI KERSCHNER MRN: [id number] DOB: 06/11/1951  Today's TOC FU Call Status: Today's TOC FU Call Status:: Unsuccessful Call (1st Attempt) Unsuccessful Call (1st Attempt) Date: 10/13/24  Attempted to reach the patient regarding the most recent Inpatient/ED visit. Daughter, Devere, on DPR, answered stating patient is currently asleep and to call at another time, maybe tomorrow.  Follow Up Plan: Additional outreach attempts will be made to reach the patient to complete the Transitions of Care (Post Inpatient/ED visit) call.   Richerd Fish, RN, BSN, CCM [facility], [facility] Management Coordinator Direct Dial: [phone]

## 2024-10-14 ENCOUNTER — Ambulatory Visit (HOSPITAL_COMMUNITY)

## 2024-10-14 ENCOUNTER — Encounter (HOSPITAL_COMMUNITY): Payer: Self-pay

## 2024-10-14 ENCOUNTER — Telehealth: Payer: Self-pay

## 2024-10-14 ENCOUNTER — Other Ambulatory Visit (HOSPITAL_COMMUNITY): Payer: Self-pay

## 2024-10-14 LAB — MISC LABCORP TEST (SEND OUT): Labcorp test code: 505700

## 2024-10-14 NOTE — Transitions of Care (Post Inpatient/ED Visit) (Signed)
   10/14/2024  Name: Jorge [name] MRN: [id number] DOB: Mar 10, 1951  Today's TOC FU Call Status: Today's TOC FU Call Status:: Unsuccessful Call (2nd Attempt) Unsuccessful Call (2nd Attempt) Date: 10/14/24  Attempted to reach the patient regarding the most recent Inpatient/ED visit.  Left a HIPAA approved voicemail message to phone number provided in demographics per DPR.    Follow Up Plan: Additional outreach attempts will be made to reach the patient to complete the Transitions of Care (Post Inpatient/ED visit) call.   Richerd Fish, RN, BSN, CCM [facility], [facility] Management Coordinator Direct Dial: [phone]

## 2024-10-15 ENCOUNTER — Telehealth: Payer: Self-pay | Admitting: *Deleted

## 2024-10-15 LAB — PARANEOPLASTIC AB
AGNA-1: NEGATIVE
Amphiphysin Antibody: NEGATIVE
Anti-Hu Ab: NEGATIVE
Anti-Ri Ab: NEGATIVE
Anti-Yo Ab: NEGATIVE
Antineruonal nuclear Ab Type 3: NEGATIVE
CASPR2 Antibody,Cell-based IFA: NEGATIVE
CRMP-5 IgG: NEGATIVE
Interpretation: NEGATIVE
LGI1 Antibody, Cell-based IFA: NEGATIVE
Purkinje Cell Cyto Ab Type 2: NEGATIVE
Purkinje Cell Cyto Ab Type Tr: NEGATIVE
VGCC Antibody: 8.3 pmol/L (ref 0.0–30.0)

## 2024-10-15 NOTE — Transitions of Care (Post Inpatient/ED Visit) (Signed)
 10/15/2024  Name: Jorge [name] MRN: [id number] DOB: [birth year]  Today's TOC FU Call Status: Today's TOC FU Call Status:: Successful TOC FU Call Completed TOC FU Call Complete Date: 10/15/24  Patient's Name and Date of Birth confirmed. Name, DOB  Transition Care Management Follow-up Telephone Call Date of Discharge: 10/12/24 Discharge Facility: [facility]) Type of Discharge: Inpatient Admission Primary Inpatient Discharge Diagnosis:: Abnormal MRI How have you been since you were released from the hospital?: Better Any questions or concerns?: Yes Patient Questions/Concerns:: Patient reports not having oxycodone . He has contacted Dr. Lanny.  Items Reviewed: Did you receive and understand the discharge instructions provided?: Yes Medications obtained,verified, and reconciled?: Partial Review Completed Reason for Partial Mediation Review: Patient had to end the telephone call Any new allergies since your discharge?: No Dietary orders reviewed?: Yes Type of Diet Ordered:: general diet Do you have support at home?: Yes People in Home [RPT]: child(ren), adult, grandchild(ren) Name of Support/Comfort Primary Source: Susan/Dtr  Medications Reviewed Today: Medications Reviewed Today     Reviewed by Lucky Andrea LABOR, RN (Registered Nurse) on 10/15/24 at 1004  Med List Status: <None>   Medication Order Taking? Sig Documenting Provider Last Dose Status Informant  acetaminophen  (TYLENOL ) 500 MG tablet [id number] Yes Take 1 tablet (500 mg total) by mouth every 6 (six) hours as needed for mild pain (pain score 1-3), fever or headache. Arlice Reichert, MD  Active   amLODipine  (NORVASC ) 10 MG tablet [id number] Yes TAKE 1 TABLET BY MOUTH EVERY DAY Boscia, Powell BRAVO, NP  Active Family Member, Pharmacy Records  cabozantinib  (CABOMETYX ) 40 MG tablet [id number] Yes Take 1 tablet (40 mg total) by mouth daily. Take on an empty stomach, 1 hour before or 2 hours after meals. Lanny Callander, MD  Active  Family Member, Pharmacy Records  calcium -vitamin D (OSCAL WITH D) 500-5 MG-MCG tablet [id number]  Take 2 tablets by mouth daily with breakfast.  Patient not taking: Reported on 10/15/2024   Arlice Reichert, MD  Active   diphenoxylate -atropine  (LOMOTIL ) 2.5-0.025 MG tablet [id number] Yes Take 2 tablets by mouth 4 (four) times daily as needed for diarrhea or loose stools. Lanny Callander, MD  Active Family Member, Pharmacy Records  gabapentin  (NEURONTIN ) 100 MG capsule [id number] Yes TAKE 2 CAPSULES BY MOUTH 3 TIMES A DAY Boscia, Powell BRAVO, NP  Active Family Member, Pharmacy Records  levETIRAcetam  (KEPPRA ) 500 MG tablet [id number] Yes Take 1 tablet (500 mg total) by mouth 2 (two) times daily. Dahal, Binaya, MD  Active   Multiple Vitamin (MULTIVITAMIN WITH MINERALS) TABS tablet [id number]  Take 1 tablet by mouth daily. [provider]  Active Family Member, Pharmacy Records  nivolumab  480 mg in sodium chloride  0.9 % 100 mL [id number]  Inject 480 mg into the vein every 30 (thirty) days. [provider]  Active Family Member, Pharmacy Records    Discontinued 09/16/22 0914          Med Note ALYNE SUZEN ORN   Mon Oct 24, 2021  1:12 PM) Not started  oxyCODONE  (OXY IR/ROXICODONE ) 5 MG immediate release tablet [id number]  Take 1 tablet (5 mg total) by mouth every 6 (six) hours as needed for severe pain (pain score 7-10) or breakthrough pain. Pickenpack-Cousar, Fannie SAILOR, NP  Active   pantoprazole  (PROTONIX ) 40 MG tablet [id number] Yes Take 1 tablet (40 mg total) by mouth daily. Arlice Reichert, MD  Active   predniSONE  (DELTASONE ) 10 MG tablet [id number] Yes Take 50mg  daily x 4 weeks, then 40mg  daily x1  week, 30mg  daily x 1 week, then 20mg  daily x 1 week, then 10mg  daily Dahal, Binaya, MD  Active   sulfamethoxazole -trimethoprim  (BACTRIM ) 400-80 MG tablet [id number] Yes Take 1 tablet by mouth daily. Arlice Reichert, MD  Active   Med List Note Steffi Nian, CPhT 10/08/24 1146): Cabozantinib  filled at [facility],  Call daughter             Home Care and Equipment/Supplies: Were Home Health Services Ordered?: No Any new equipment or medical supplies ordered?: No  Functional Questionnaire: Do you need assistance with bathing/showering or dressing?: No Do you need assistance with meal preparation?: No Do you need assistance with eating?: No Do you have difficulty maintaining continence: No Do you need assistance with getting out of bed/getting out of a chair/moving?: No Do you have difficulty managing or taking your medications?: No  Follow up appointments reviewed: PCP Follow-up appointment confirmed?: No (Patient denies having a PCP) MD Provider Line Number:[phone] Given: No (Patient declined) Specialist Hospital Follow-up appointment confirmed?: Yes Date of Specialist follow-up appointment?: 10/24/24 Follow-Up Specialty Provider:: Dr. Feng/Oncology Do you need transportation to your follow-up appointment?: No (Dtr takes patient to appointments) Do you understand care options if your condition(s) worsen?: Yes-patient verbalized understanding  SDOH Interventions Today    Flowsheet Row Most Recent Value  SDOH Interventions   Transportation Interventions Intervention Not Indicated    Goals Addressed             This Visit's Progress    VBCI Transitions of Care (TOC) Care Plan       Problems:  Recent Hospitalization for treatment of abnormal MRI-Cerebral amyloid angiopathy No Hospital Follow Up Provider appointment patient declines scheduling hospital follow up with PCP  Goal:  Over the next 30 days, the patient will not experience hospital readmission  Interventions:  Transitions of Care: Doctor Visits  - discussed the importance of doctor visits Reviewed Signs and symptoms of infection Medication review partially completed, reviewed prednisone  taper and Keppra , verified patient is taking as instructed Offered to assist with scheduling with PCP-patient declines, planning  to follow up with Dr. Feng/Oncology on 10/24/24 Reviewed referral to Neuro Rehab, RNCM will follow up next week   Patient Self Care Activities:  Attend all scheduled provider appointments Call provider office for new concerns or questions  Notify RN Care Manager of [facility] call rescheduling needs Participate in Transition of Care Program/Attend [facility] scheduled calls Take medications as prescribed    Plan:  Telephone follow up appointment with care management team member scheduled for:  10/21/24 at 3:15pm       Discussed and offered 30 day TOC program.  Patient        enrolled.  The patient has been provided with contact information for the care management team and has been advised to call with any health -related questions or concerns.  The patient verbalized understanding with current plan of care.  The patient is directed to their insurance card regarding availability of benefits coverage.    Andrea Dimes RN, BSN [place]  [facility] Health RN Care Manager [phone]

## 2024-10-16 ENCOUNTER — Other Ambulatory Visit: Payer: Self-pay

## 2024-10-16 NOTE — Progress Notes (Signed)
 Specialty Pharmacy Refill Coordination Note  Jorge [name] is a 73 y.o. male contacted today regarding refills of specialty medication(s) Cabozantinib  S-Malate (CABOMETYX )   Patient requested Delivery   Delivery date: 10/17/24   Verified address: [address] [place] [address]   Medication will be filled on: 10/16/24  Spoke with patient's daughter

## 2024-10-16 NOTE — Progress Notes (Signed)
 Clinical Intervention Note  Clinical Intervention Notes: Patient reports starting Keppra , no DDIs were identified with his Cabometyx .   Clinical Intervention Outcomes: Prevention of an adverse drug event   Silvano LOISE Blair Karel Santa

## 2024-10-20 ENCOUNTER — Other Ambulatory Visit: Payer: Self-pay

## 2024-10-21 ENCOUNTER — Other Ambulatory Visit: Admitting: *Deleted

## 2024-10-21 ENCOUNTER — Other Ambulatory Visit: Payer: Self-pay

## 2024-10-21 NOTE — Transitions of Care (Post Inpatient/ED Visit) (Signed)
 Transition of Care week 2  Visit Note  10/21/2024  Name: Jorge [name] MRN: [id number]          DOB: 1951-10-09  Situation: Patient enrolled in [facility] 30-day program. Visit completed with Mr. Ostergaard by telephone.   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    Assessment: Patient Reported Symptoms: Cognitive Cognitive Status: Able to follow simple commands, Alert and oriented to person, place, and time, Normal speech and language skills      Neurological Neurological Review of Symptoms: Headaches [place] Neurological Symptoms/Conditions [RPT]: Patient had a headache this morning, relieved with tylenol  and drinking water Neurological Management Strategies: Medication therapy, Coping strategies, Adequate rest, Routine screening Neurological Self-Management Outcome: 4 (good) Neurological Comment: Patient needs to schedule with Neurology and outpatient PT with Neuro Rehab.  HEENT HEENT Symptoms Reported: No symptoms reported      Cardiovascular Cardiovascular Symptoms Reported: No symptoms reported    Respiratory Respiratory Symptoms Reported: No symptoms reported    Endocrine Is patient diabetic?: No    Gastrointestinal Gastrointestinal Symptoms Reported: No symptoms reported Gastrointestinal Self-Management Outcome: 4 (good)    Genitourinary Genitourinary Symptoms Reported: No symptoms reported    Integumentary Integumentary Symptoms Reported: No symptoms reported    Musculoskeletal Musculoskelatal Symptoms Reviewed: No symptoms reported Musculoskeletal Management Strategies: Coping strategies, Adequate rest, Routine screening Musculoskeletal Comment: Patient ambulating independently Falls in the past year?: No    Psychosocial Psychosocial Symptoms Reported:  Not assessed Additional Psychological Details: RNCM spoke with patient and his daughter/DPR         There were no vitals filed for this visit.    Medications Reviewed Today     Reviewed by Lucky Andrea LABOR, RN (Registered Nurse) on 10/21/24 at 1539  Med List Status: <None>   Medication Order Taking? Sig Documenting Provider Last Dose Status Informant  acetaminophen  (TYLENOL ) 500 MG tablet [id number] Yes Take 1 tablet (500 mg total) by mouth every 6 (six) hours as needed for mild pain (pain score 1-3), fever or headache. Arlice Reichert, MD  Active   amLODipine  (NORVASC ) 10 MG tablet [id number] Yes TAKE 1 TABLET BY MOUTH EVERY DAY Boscia, Heather E, NP  Active Family Member, Pharmacy Records  cabozantinib  (CABOMETYX ) 40 MG tablet [id number] Yes Take 1 tablet (40 mg total) by mouth daily. Take on an empty stomach, 1 hour before or 2 hours after meals. Lanny Callander, MD  Active Family Member, Pharmacy Records  calcium -vitamin D (OSCAL WITH D) 500-5 MG-MCG tablet [id number]  Take 2 tablets by mouth daily with breakfast.  Patient not taking: Reported on 10/21/2024   Arlice Reichert, MD  Active   diphenoxylate -atropine  (LOMOTIL ) 2.5-0.025 MG tablet [id number] Yes Take 2 tablets by mouth 4 (four) times daily as needed for diarrhea or loose stools. Lanny Callander, MD  Active Family Member, Pharmacy Records  gabapentin  (NEURONTIN ) 100 MG capsule [id number] Yes TAKE 2 CAPSULES BY MOUTH 3 TIMES A DAY Boscia, Powell BRAVO, NP  Active Family Member, Pharmacy Records  levETIRAcetam  (KEPPRA ) 500 MG tablet [id number] Yes Take 1 tablet (500 mg total) by mouth 2 (two) times daily. Arlice Reichert, MD  Active   Multiple Vitamin (MULTIVITAMIN WITH MINERALS) TABS tablet [id number] Yes Take 1 tablet by mouth daily. [provider]  Active Family Member, Pharmacy Records  nivolumab  480 mg in sodium  chloride 0.9 % 100 mL [id number] Yes Inject 480 mg into the vein every 30 (thirty) days. [provider]  Active Family Member,  Pharmacy Records    Discontinued 09/16/22 0914          Med Note ALYNE SUZEN ORN   Mon Oct 24, 2021  1:12 PM) Not started  oxyCODONE  (OXY IR/ROXICODONE ) 5 MG immediate release tablet [id number] Yes Take 1 tablet (5 mg total) by mouth every 6 (six) hours as needed for severe pain (pain score 7-10) or breakthrough pain. Pickenpack-Cousar, Fannie SAILOR, NP  Active   pantoprazole  (PROTONIX ) 40 MG tablet [id number] Yes Take 1 tablet (40 mg total) by mouth daily. Arlice Reichert, MD  Active   predniSONE  (DELTASONE ) 10 MG tablet [id number] Yes Take 50mg  daily x 4 weeks, then 40mg  daily x1 week, 30mg  daily x 1 week, then 20mg  daily x 1 week, then 10mg  daily Dahal, Reichert, MD  Active   sulfamethoxazole -trimethoprim  (BACTRIM ) 400-80 MG tablet [id number] Yes Take 1 tablet by mouth daily. Arlice Reichert, MD  Active   Med List Note Steffi Nian, CPhT 10/08/24 1146): Cabozantinib  filled at [facility], Call daughter             Recommendation:   Continue Current Plan of Care  Follow Up Plan:   Telephone follow-up in 1 week  Andrea Dimes RN, BSN [place]  [facility] Health RN Care Manager [phone]

## 2024-10-21 NOTE — Patient Instructions (Signed)
 Visit Information  Thank you for taking time to visit with me today. Please don't hesitate to contact me if I can be of assistance to you before our next scheduled telephone appointment.   Following is a copy of your care plan:   Goals Addressed             This Visit's Progress    VBCI Transitions of Care (TOC) Care Plan       Problems:  Recent Hospitalization for treatment of abnormal MRI-Cerebral amyloid angiopathy No Hospital Follow Up Provider appointment patient declines scheduling hospital follow up with PCP  Goal:  Over the next 30 days, the patient will not experience hospital readmission  Interventions:  Transitions of Care: Doctor Visits  - discussed the importance of doctor visits Reviewed Signs and symptoms of infection Medication review completed, reviewed medications with DPR/Susan Offered to assist with scheduling with PCP-patient declines, planning to follow up with Dr. Feng/Oncology on 10/24/24 Reviewed referral to Neuro Rehab, RNCM advised patient/DPR to call to schedule [phone] Provided MD referral line [phone] to call for assistance with establishing new PCP Provided with [facility] Neurology [phone], call for an appointment Provided with [facility] Imaging [phone] Reveiwed upcoming appointments with Dr. Lanny on 10/24/24 and Oncology and Palliative Care on 11/05/24  Patient Self Care Activities:  Attend all scheduled provider appointments Call provider office for new concerns or questions  Notify RN Care Manager of [facility] call rescheduling needs Participate in Transition of Care Program/Attend [facility] scheduled calls Take medications as prescribed    Plan:  Telephone follow up appointment with care management team member scheduled for:  11/05/24 at 10am        Patient verbalizes understanding of instructions and care plan provided today and agrees to view in MyChart. Active MyChart status and patient understanding of how to access  instructions and care plan via MyChart confirmed with patient.     Telephone follow up appointment with care management team member scheduled for:11/05/24 at 10am  Please call the care guide team at [phone] if you need to cancel or reschedule your appointment.   Please call 1-800-273-TALK (toll free, 24 hour hotline) go to [facility] Urgent [facility] [address] [place] [phone]) call 911 if you are experiencing a Mental Health or Behavioral Health Crisis or need someone to talk to.  Andrea Dimes RN, BSN [place]  [facility] Health RN Care Manager [phone]

## 2024-10-21 NOTE — Progress Notes (Signed)
 Specialty Pharmacy Ongoing Clinical Assessment Note  I spoke with the patient's daughter, Devere. Jorge [name] is a 73 y.o. male who is being followed by the specialty pharmacy service for RxSp Oncology   Patient's specialty medication(s) reviewed today: Cabozantinib  S-Malate (CABOMETYX )   Missed doses in the last 4 weeks: 6 (patient was hospitalized)   Patient/Caregiver did not have any additional questions or concerns.   Therapeutic benefit summary: Patient is achieving benefit   Adverse events/side effects summary: Experienced adverse events/side effects (diarrhea, tolerable with Imodium  and Lomotil  PRN)   Patient's therapy is appropriate to: Continue    Goals Addressed             This Visit's Progress    Maintain optimal adherence to therapy   On track    Patient is on track. Patient will maintain adherence         Follow up: 3 months  Silvano LOISE [place] Specialty Pharmacist

## 2024-10-24 ENCOUNTER — Inpatient Hospital Stay: Attending: Physician Assistant | Admitting: Hematology

## 2024-10-24 ENCOUNTER — Encounter: Payer: Self-pay | Admitting: Hematology

## 2024-10-24 DIAGNOSIS — C7800 Secondary malignant neoplasm of unspecified lung: Secondary | ICD-10-CM

## 2024-10-24 DIAGNOSIS — R197 Diarrhea, unspecified: Secondary | ICD-10-CM

## 2024-10-24 DIAGNOSIS — Z515 Encounter for palliative care: Secondary | ICD-10-CM | POA: Diagnosis not present

## 2024-10-24 DIAGNOSIS — C787 Secondary malignant neoplasm of liver and intrahepatic bile duct: Secondary | ICD-10-CM | POA: Diagnosis not present

## 2024-10-24 DIAGNOSIS — C20 Malignant neoplasm of rectum: Secondary | ICD-10-CM | POA: Insufficient documentation

## 2024-10-24 DIAGNOSIS — C7801 Secondary malignant neoplasm of right lung: Secondary | ICD-10-CM | POA: Diagnosis not present

## 2024-10-24 DIAGNOSIS — Z87891 Personal history of nicotine dependence: Secondary | ICD-10-CM | POA: Insufficient documentation

## 2024-10-24 DIAGNOSIS — G893 Neoplasm related pain (acute) (chronic): Secondary | ICD-10-CM | POA: Diagnosis not present

## 2024-10-24 MED ORDER — DIPHENOXYLATE-ATROPINE 2.5-0.025 MG PO TABS: 2.0000 | ORAL_TABLET | Freq: Four times a day (QID) | ORAL | 1 refills | Status: DC | PRN

## 2024-10-24 NOTE — Assessment & Plan Note (Signed)
 [id number] with lung metastasis, MMR normal, KRAS(+) [id number]  -diagnosed 08/2021 by colonoscopy for rectal bleeding and frequent BM. Staging MRI showed early extra mesorectal lymph node involvement.  -baseline CEA 571.84 on 09/06/21.  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 Xeloda  dose reduced due to elevated liver enzymes.  -due to new liver lesions, we switched to second-line FOLFIRI, with continued beva, on 02/21/22. He tolerates well overall.  - restaging CT scan on April 22 and June 26 showed stable disease overall. -CT 10/09/2023 showed mild disease progression in lungs, his tumor marker CEA has been going up also.  -I changed his chemo to lonsurf  and beva, he started on 12/13/2023, unfortunately progressed on CT 02/25/2024 -treatment changed to Stivarga . He required drug assistance  -restaging CT 07/01/2024 showed disease progression  -will change tx to Nivo and cabozantinib , he started on 07/15/24

## 2024-10-24 NOTE — Progress Notes (Signed)
 " [facility]   [phone] [phone]   Clinic Follow up Note   Patient Care Team: Petrina Pries, NP as PCP - General (Family Medicine) Croitoru, Jerel, MD as PCP - Cardiology (Cardiology) Pickenpack-Cousar, Fannie SAILOR, NP as Nurse Practitioner (Nurse Practitioner) Lanny Callander, MD as Consulting Physician (Hematology) Lucky Andrea LABOR, RN as [facility] Care Management 10/24/2024  I connected with Jorge [name] on 10/24/2024 at  1:00 PM EST by telephone and verified that I am speaking with the correct person using two identifiers.   I discussed the limitations, risks, security and privacy concerns of performing an evaluation and management service by telephone and the availability of in person appointments. I also discussed with the patient that there may be a patient responsible charge related to this service. The patient expressed understanding and agreed to proceed.   Patient's location:  Home  Provider's location:  Office    CHIEF COMPLAINT: f/u hospital discharge    CURRENT THERAPY: nivo and cabozantinib   Oncology history Rectal cancer (HCC) [phone] with lung metastasis, MMR normal, KRAS(+) [id number]  -diagnosed 08/2021 by colonoscopy for rectal bleeding and frequent BM. Staging MRI showed early extra mesorectal lymph node involvement.  -baseline CEA 571.84 on 09/06/21.  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 Xeloda  dose reduced due to elevated liver enzymes.  -due to new liver lesions, we switched to second-line FOLFIRI, with continued beva, on 02/21/22. He tolerates well overall.  - restaging CT scan on April 22 and June 26 showed stable disease overall. -CT 10/09/2023 showed mild disease progression in lungs, his tumor marker CEA has been going up also.  -I changed his chemo to lonsurf  and beva, he started on  12/13/2023, unfortunately progressed on CT 02/25/2024 -treatment changed to Stivarga . He required drug assistance  -restaging CT 07/01/2024 showed disease progression  -will change tx to Nivo and cabozantinib , he started on 07/15/24   Assessment & Plan Metastatic rectal adenocarcinoma with pulmonary metastases Active metastatic rectal adenocarcinoma with pulmonary involvement. Recent CT imaging demonstrated improvement in pulmonary nodules, indicating response to current therapy. He has been receiving nivolumab  and cabozantinib . Due to acute neurological symptoms, immunotherapy-related adverse effects are suspected. He is regaining functional status, maintains good appetite, and pain is controlled. - Held nivolumab  infusions for now  -Okay to restart Cabozantinib  - Ordered whole body CT scan to assess disease status. - Scheduled follow-up appointment for January 29th. - Planned to reassess treatment options after neurology evaluation and CT scan. - Restart cabozantinib  if functional status permits and after assessment.  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 Currently, he experiences approximately one episode daily, with occasional sudden onset, particularly nocturnally or in the morning. Symptoms have responded to antidiarrheal therapy. - Refilled loperamide  (Imodium ) and Lomotil  (diphenoxylate /atropine ) prescriptions for symptomatic management. - Instructed to continue loperamide  as needed.  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 Neurological symptoms have improved but remain concerning. Neurology attributed the episode to swelling in a brain vessel. No definitive diagnostic test exists, but the timing raises suspicion for immunotherapy-related encephalopathy. - Referred to  neurology [facility]) for evaluation within 4-6 weeks. - Held nivolumab  infusions for one month pending further neurological assessment.  Plan - He is recovering from his recent hospital admission, his  speech is still slightly slurred, no confusion - Plan to hold nivolumab  for now, okay to restart cabozantinib  -lab and f/u on 12/30      SUMMARY OF ONCOLOGIC HISTORY: Oncology History Overview Note  Cancer Staging Rectal cancer [facility]) Staging form: Colon and Rectum, AJCC 8th Edition - Clinical stage from 08/29/2021: Jorge [name], cM1 - Signed by Lanny Callander, MD on 08/29/2021    Rectal cancer (HCC)  07/23/2021 Imaging   CT AP  IMPRESSION: 1. Appearance of the rectum likely indicates a rectal mass suspicious for rectal carcinoma. Inflammatory process would be less likely. Direct visualization is suggested. 2. Cholelithiasis without evidence of acute cholecystitis. 3. Aortic atherosclerosis.   08/19/2021 Procedure   Colonoscopy, under Dr. Wilhelmenia  Impression: - Rectal tenderness, palpable rectal mass and hemorrhoids found on digital rectal exam. - Stool in the entire examined colon - lavaged with still inadequate clearance. - One 35 mm polyp at the hepatic flexure. Biopsied. Tattooed distal in case future endoscopic resection is considered - however, has larger issues with rectal mass currently as below. - Four, 5 to 11 mm polyps in the transverse colon and at the hepatic flexure, removed with a cold snare. Resected and retrieved. - Diverticulosis in the recto-sigmoid colon and in the sigmoid colon. - Rule out malignancy, partially obstructing tumor in the rectum and from 6 to 13 cm proximal to the anus. Biopsied.   08/19/2021 Pathology Results   Diagnosis 1. Hepatic Flexure Biopsy - TUBULAR ADENOMA WITHOUT HIGH-GRADE DYSPLASIA OR MALIGNANCY 2. Transverse Colon Biopsy, and hepatic flexure, polyps (4) - TUBULAR ADENOMA WITHOUT HIGH-GRADE DYSPLASIA OR  MALIGNANCY - OTHER FRAGMENTS OF POLYPOID COLONIC MUCOSA WITH NO SPECIFIC HISTOPATHOLOGIC CHANGES - FOOD MATERIAL 3. Rectum, biopsy - ADENOCARCINOMA. SEE NOTE   08/22/2021 Initial Diagnosis   Rectal cancer (HCC)   08/26/2021 Imaging   IMPRESSION: 1. A 2.2 x 1.9 cm right middle lobe pulmonary nodule as well as a total of four right upper lobe pulmonary nodule and masses measuring up to 3.6 cm. Findings concerning for metastatic primary lung cancer versus less likely metastases in a patient with rectal cancer. Additional imaging evaluation or consultation with Pulmonology or Thoracic Surgery recommended. 2. No gross hilar adenopathy, noting limited sensitivity for the detection of hilar adenopathy on this noncontrast study. 3. Cholelithiasis. 4.  Emphysema (ICD10-J43.9). 5. At least left anterior descending coronary artery calcifications.   08/27/2021 Imaging   IMPRESSION: Rectal adenocarcinoma T stage: T4 B   Rectal adenocarcinoma N stage: N2 disease likely associated with early extra mesorectal lymph node involvement.   Distance from tumor to the internal anal sphincter is 1.2 cm.   Also with extramural venous involvement as described.   08/29/2021 Cancer Staging   Staging form: Colon and Rectum, AJCC 8th Edition - Clinical stage from 08/29/2021: Jorge [name], cM1 - Signed by Lanny Callander, MD on 08/29/2021 Stage prefix: Initial diagnosis   01/23/2022 Imaging   CT CAP w contrast IMPRESSION: 1. Mild interval decrease in size of multiple right-sided pulmonary nodules. No new suspicious pulmonary nodule or mass. 2. Interval development of approximately 10 new ill-defined hypoattenuating liver lesions ranging in size from approximately 5-10 mm. Imaging features suspicious for metastatic disease. MRI abdomen with and without contrast may prove helpful to further evaluate. 3. Similar appearance of wall thickening in the rectum with perirectal edema. 4. Cholelithiasis. 5.  Prostatomegaly. 6. Aortic Atherosclerosis (ICD10-I70.0).   11/21/2022 Imaging    IMPRESSION: 1. Stable exam. No new or progressive interval findings. 2. The primary  rectosigmoid lesion described previously is not well seen on the current study. There is presacral and perirectal edema, likely treatment related. 3. No substantial change in appearance of bilateral pulmonary metastases. 4. Stable tiny hypodensities in both hepatic lobes. These were previously characterized as metastatic disease. No new liver lesions on today's exam. 5. Cholelithiasis. 6. Emphysema (ICD10-J43.9) and Aortic Atherosclerosis (ICD10-170.0)     02/25/2024 Imaging   CT Chest, Abdomen, and Pelvis with contrast  IMPRESSION: Multiple lung nodules are again identified. These are increased in size from previous.   Previous nodes however in the mediastinum and right lung hilum are improving.   Fatty infiltration with multiple low-attenuation liver lesions. Many these are slightly larger but more low in density today. Dilated gallbladder with stones.   Persistent wall thickening and nodularity along the rectum with adjacent stranding and fluid. Please correlate with exact location of neoplasm.   Overall mixed response.       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 These are  increased in size from previous.  Previous nodes however in the mediastinum and right lung hilum are improving.   Fatty infiltration with multiple low-attenuation liver lesions. Many these are slightly larger but more low in density today.   Dilated gallbladder with stones. Persistent wall thickening and nodularity along the rectum with adjacent stranding and fluid. Please correlate with exact location of neoplasm.   Overall mixed response.       07/15/2024 -  Chemotherapy   Patient is on Treatment Plan : ANAL Nivolumab  (480) q28d       Discussed the use of AI scribe software for clinical note transcription with the patient, who gave verbal consent to proceed.  History of Present Illness Jorge [name] is a 73 year old male with metastatic rectal cancer on immunotherapy who presents for oncology follow-up after recent hospitalization for severe diarrhea and acute encephalopathy.  He is receiving nivolumab  and cabozantinib  for metastatic rectal cancer with pulmonary metastases. Recent CT scan showed interval improvement in pulmonary nodules.  He was recently hospitalized for severe, refractory diarrhea and acute encephalopathy. During admission, neurology started a prednisone  taper, which he continues.  Since discharge he has ongoing diarrhea about once daily, often sudden in onset at night or in the morning, with urgency and discomfort. He uses loperamide  and finds diphenoxylate /atropine  effective at two tablets three times daily and requests a refill. He denies new pain or other discomfort. Appetite is preserved and he is able to care for himself and his grandchildren, though activity varies.  His speech is mildly slowed but improved compared with hospitalization. He has had no recurrent confusion or memory issues. Neurology raised concern for possible vascular swelling and will follow up in 4-6 weeks. He remains on a steroid taper without further neurologic symptoms.     REVIEW OF  SYSTEMS:   Constitutional:  Denies fevers, chills or abnormal weight loss Eyes: Denies blurriness of vision Ears, nose, mouth, throat, and face: Denies mucositis or sore throat Respiratory: Denies cough, dyspnea or wheezes Cardiovascular: Denies palpitation, chest discomfort or lower extremity swelling Gastrointestinal:  Denies nausea, heartburn or change in bowel habits Skin: Denies abnormal skin rashes Lymphatics: Denies new lymphadenopathy or easy bruising Neurological:Denies numbness, tingling or new weaknesses Behavioral/Psych: Mood is stable, no new changes  All other systems were reviewed with the patient and are negative.  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    SURGICAL HISTORY: Past Surgical History:  Procedure Laterality Date   BRONCHIAL BIOPSY  09/27/2021   Procedure: BRONCHIAL BIOPSIES;  Surgeon: Brenna Adine CROME, DO;  Location: [facility];  Service: Pulmonary;;   BRONCHIAL BRUSHINGS  09/27/2021   Procedure: BRONCHIAL BRUSHINGS;  Surgeon: Brenna Adine CROME, DO;  Location: [facility];  Service: Pulmonary;;   BRONCHIAL NEEDLE ASPIRATION BIOPSY  09/27/2021   Procedure: BRONCHIAL NEEDLE ASPIRATION BIOPSIES;  Surgeon: Brenna Adine CROME, DO;  Location: [facility];  Service: Pulmonary;;   COLONOSCOPY     IR IMAGING GUIDED PORT INSERTION  10/24/2021   left breast surgery Left 1968   VIDEO BRONCHOSCOPY WITH RADIAL ENDOBRONCHIAL ULTRASOUND  09/27/2021   Procedure: RADIAL ENDOBRONCHIAL ULTRASOUND;  Surgeon: Brenna Adine CROME, DO;  Location: [facility];  Service: Pulmonary;;    I have reviewed the social history and family history with the patient and they are unchanged from previous note.  ALLERGIES:  has no known allergies.  MEDICATIONS:  Current Outpatient Medications  Medication Sig Dispense Refill   acetaminophen  (TYLENOL ) 500 MG tablet Take 1 tablet (500 mg  total) by mouth every 6 (six) hours as needed for mild pain (pain score 1-3), fever or headache.     amLODipine  (NORVASC ) 10 MG tablet TAKE 1 TABLET BY MOUTH EVERY DAY 90 tablet 0   cabozantinib  (CABOMETYX ) 40 MG tablet Take 1 tablet (40 mg total) by mouth daily. Take on an empty stomach, 1 hour before or 2 hours after meals. 30 tablet 2   calcium -vitamin D (OSCAL WITH D) 500-5 MG-MCG tablet Take 2 tablets by mouth daily with breakfast. (Patient not taking: Reported on 10/21/2024) 180 tablet 0   diphenoxylate -atropine  (LOMOTIL ) 2.5-0.025 MG tablet Take 2 tablets by mouth 4 (four) times daily as needed for diarrhea or loose stools. 120 tablet 1   gabapentin  (NEURONTIN ) 100 MG capsule TAKE 2 CAPSULES BY MOUTH 3 TIMES A DAY 180 capsule 2   levETIRAcetam  (KEPPRA ) 500 MG tablet Take 1 tablet (500 mg total) by mouth 2 (two) times daily. 180 tablet 0   Multiple Vitamin (MULTIVITAMIN WITH MINERALS) TABS tablet Take 1 tablet by mouth daily.     nivolumab  480 mg in sodium chloride  0.9 % 100 mL Inject 480 mg into the vein every 30 (thirty) days.     oxyCODONE  (OXY IR/ROXICODONE ) 5 MG immediate release tablet Take 1 tablet (5 mg total) by mouth every 6 (six) hours as needed for severe pain (pain score 7-10) or breakthrough pain. 60 tablet 0   pantoprazole  (PROTONIX ) 40 MG tablet Take 1 tablet (40 mg total) by mouth daily. 90 tablet 0   predniSONE  (DELTASONE ) 10 MG tablet Take 50mg  daily x 4 weeks, then 40mg  daily x1 week, 30mg  daily x 1 week, then 20mg  daily x 1 week, then 10mg  daily 225 tablet 0   sulfamethoxazole -trimethoprim  (BACTRIM ) 400-80  MG tablet Take 1 tablet by mouth daily. 90 tablet 0   No current facility-administered medications for this visit.    PHYSICAL EXAMINATION: Not performed   LABORATORY DATA:  I have reviewed the data as listed    Latest Ref Rng & Units 10/10/2024    9:32 AM 10/07/2024   11:07 AM 09/11/2024   10:03 AM  CBC  WBC 4.0 - 10.5 K/uL 3.8  4.0  3.7   Hemoglobin 13.0 -  17.0 g/dL 87.1  81.7  85.0   Hematocrit 39.0 - 52.0 % 39.8  57.1  45.7   Platelets 150 - 400 K/uL 192  277  172         Latest Ref Rng & Units 10/12/2024    5:31 AM 10/11/2024    6:00 AM 10/10/2024    9:32 AM  CMP  Glucose 70 - 99 mg/dL 843  837  855   BUN 8 - 23 mg/dL 13  13  11    Creatinine 0.61 - 1.24 mg/dL 9.09  9.03  9.07   Sodium 135 - 145 mmol/L 135  135  137   Potassium 3.5 - 5.1 mmol/L 4.2  4.5  3.3   Chloride 98 - 111 mmol/L 102  102  103   CO2 22 - 32 mmol/L 27  27  30    Calcium  8.9 - 10.3 mg/dL 7.9  8.0  8.0   Total Protein 6.5 - 8.1 g/dL  6.0  5.7   Total Bilirubin 0.0 - 1.2 mg/dL  0.9  0.6   Alkaline Phos 38 - 126 U/L  123  123   AST 15 - 41 U/L  44  44   ALT 0 - 44 U/L  34  33       RADIOGRAPHIC STUDIES: I have personally reviewed the radiological images as listed and agreed with the findings in the report. No results found.     I discussed the assessment and treatment plan with the patient. The patient was provided an opportunity to ask questions and all were answered. The patient agreed with the plan and demonstrated an understanding of the instructions.   The patient was advised to call back or seek an in-person evaluation if the symptoms worsen or if the condition fails to improve as anticipated.  I provided 25 minutes of non face-to-face telephone visit time during this encounter, including review of chart and various tests results, discussions about plan of care and coordination of care plan.    Onita Mattock, MD 10/24/2024     "

## 2024-11-02 ENCOUNTER — Other Ambulatory Visit: Payer: Self-pay | Admitting: Nurse Practitioner

## 2024-11-02 DIAGNOSIS — G893 Neoplasm related pain (acute) (chronic): Secondary | ICD-10-CM

## 2024-11-02 DIAGNOSIS — Z515 Encounter for palliative care: Secondary | ICD-10-CM

## 2024-11-02 DIAGNOSIS — C20 Malignant neoplasm of rectum: Secondary | ICD-10-CM

## 2024-11-02 MED ORDER — OXYCODONE HCL 5 MG PO TABS: 5.0000 mg | ORAL_TABLET | Freq: Four times a day (QID) | ORAL | 0 refills | Status: DC | PRN

## 2024-11-02 NOTE — Progress Notes (Unsigned)
 "     [facility]   Telephone:(336) [phone] Fax:(336) [phone]    Patient Care Team: Petrina Pries, NP as PCP - General (Family Medicine) Croitoru, Jerel, MD as PCP - Cardiology (Cardiology) Pickenpack-Cousar, Fannie SAILOR, NP as Nurse Practitioner (Nurse Practitioner) Lanny Callander, MD as Consulting Physician (Hematology) Lucky Andrea LABOR, RN as VBCI Care Management   CHIEF COMPLAINT: Follow up metastatic rectal cancer   Oncology History Overview Note  Cancer Staging Rectal cancer [facility]) Staging form: Colon and Rectum, AJCC 8th Edition - Clinical stage from 08/29/2021: Jorge [name], cM1 - Signed by Lanny Callander, MD on 08/29/2021    Rectal cancer (HCC)  07/23/2021 Imaging   CT AP  IMPRESSION: 1. Appearance of the rectum likely indicates a rectal mass suspicious for rectal carcinoma. Inflammatory process would be less likely. Direct visualization is suggested. 2. Cholelithiasis without evidence of acute cholecystitis. 3. Aortic atherosclerosis.   08/19/2021 Procedure   Colonoscopy, under Dr. Wilhelmenia  Impression: - Rectal tenderness, palpable rectal mass and hemorrhoids found on digital rectal exam. - Stool in the entire examined colon - lavaged with still inadequate clearance. - One 35 mm polyp at the hepatic flexure. Biopsied. Tattooed distal in case future endoscopic resection is considered - however, has larger issues with rectal mass currently as below. - Four, 5 to 11 mm polyps in the transverse colon and at the hepatic flexure, removed with a cold snare. Resected and retrieved. - Diverticulosis in the recto-sigmoid colon and in the sigmoid colon. - Rule out malignancy, partially obstructing tumor in the rectum and from 6 to 13 cm proximal to the anus. Biopsied.   08/19/2021 Pathology Results   Diagnosis 1. Hepatic Flexure Biopsy - TUBULAR ADENOMA WITHOUT HIGH-GRADE DYSPLASIA OR MALIGNANCY 2. Transverse Colon Biopsy, and hepatic flexure, polyps (4) - TUBULAR ADENOMA  WITHOUT HIGH-GRADE DYSPLASIA OR MALIGNANCY - OTHER FRAGMENTS OF POLYPOID COLONIC MUCOSA WITH NO SPECIFIC HISTOPATHOLOGIC CHANGES - FOOD MATERIAL 3. Rectum, biopsy - ADENOCARCINOMA. SEE NOTE   08/22/2021 Initial Diagnosis   Rectal cancer (HCC)   08/26/2021 Imaging   IMPRESSION: 1. A 2.2 x 1.9 cm right middle lobe pulmonary nodule as well as a total of four right upper lobe pulmonary nodule and masses measuring up to 3.6 cm. Findings concerning for metastatic primary lung cancer versus less likely metastases in a patient with rectal cancer. Additional imaging evaluation or consultation with Pulmonology or Thoracic Surgery recommended. 2. No gross hilar adenopathy, noting limited sensitivity for the detection of hilar adenopathy on this noncontrast study. 3. Cholelithiasis. 4.  Emphysema (ICD10-J43.9). 5. At least left anterior descending coronary artery calcifications.   08/27/2021 Imaging   IMPRESSION: Rectal adenocarcinoma T stage: T4 B   Rectal adenocarcinoma N stage: N2 disease likely associated with early extra mesorectal lymph node involvement.   Distance from tumor to the internal anal sphincter is 1.2 cm.   Also with extramural venous involvement as described.   08/29/2021 Cancer Staging   Staging form: Colon and Rectum, AJCC 8th Edition - Clinical stage from 08/29/2021: Jorge [name], cM1 - Signed by Lanny Callander, MD on 08/29/2021 Stage prefix: Initial diagnosis   01/23/2022 Imaging   CT CAP w contrast IMPRESSION: 1. Mild interval decrease in size of multiple right-sided pulmonary nodules. No new suspicious pulmonary nodule or mass. 2. Interval development of approximately 10 new ill-defined hypoattenuating liver lesions ranging in size from approximately 5-10 mm. Imaging features suspicious for metastatic disease. MRI abdomen with and without contrast may prove helpful to further evaluate.  3. Similar appearance of wall thickening in the rectum with perirectal edema. 4.  Cholelithiasis. 5. Prostatomegaly. 6. Aortic Atherosclerosis (ICD10-I70.0).   11/21/2022 Imaging    IMPRESSION: 1. Stable exam. No new or progressive interval findings. 2. The primary rectosigmoid lesion described previously is not well seen on the current study. There is presacral and perirectal edema, likely treatment related. 3. No substantial change in appearance of bilateral pulmonary metastases. 4. Stable tiny hypodensities in both hepatic lobes. These were previously characterized as metastatic disease. No new liver lesions on today's exam. 5. Cholelithiasis. 6. Emphysema (ICD10-J43.9) and Aortic Atherosclerosis (ICD10-170.0)     02/25/2024 Imaging   CT Chest, Abdomen, and Pelvis with contrast  IMPRESSION: Multiple lung nodules are again identified. These are increased in size from previous.   Previous nodes however in the mediastinum and right lung hilum are improving.   Fatty infiltration with multiple low-attenuation liver lesions. Many these are slightly larger but more low in density today. Dilated gallbladder with stones.   Persistent wall thickening and nodularity along the rectum with adjacent stranding and fluid. Please correlate with exact location of neoplasm.   Overall mixed response.       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 These are increased in size from previous.  Previous nodes however in the mediastinum and right lung hilum are improving.   Fatty infiltration with multiple low-attenuation liver lesions. Many these are slightly larger but more low in density today.   Dilated gallbladder with stones. Persistent wall thickening and nodularity along the rectum with adjacent stranding and fluid. Please correlate with exact location of neoplasm.   Overall mixed response.       07/15/2024 -  Chemotherapy   Patient is on Treatment Plan : ANAL Nivolumab  (480) q28d        CURRENT THERAPY: Nivo/cabozantinib ; nivo currently on hold   INTERVAL HISTORY Jorge [name] returns for follow up.  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     Past Surgical History:  Procedure Laterality Date   BRONCHIAL BIOPSY  09/27/2021   Procedure: BRONCHIAL BIOPSIES;  Surgeon: Brenna Adine CROME, DO;  Location: [facility] ENDOSCOPY;  Service: Pulmonary;;   BRONCHIAL BRUSHINGS  09/27/2021   Procedure: BRONCHIAL BRUSHINGS;  Surgeon: Brenna Adine CROME, DO;  Location: [facility] ENDOSCOPY;  Service: Pulmonary;;   BRONCHIAL NEEDLE ASPIRATION BIOPSY  09/27/2021   Procedure: BRONCHIAL NEEDLE ASPIRATION BIOPSIES;  Surgeon: Brenna Adine CROME, DO;  Location: [facility] ENDOSCOPY;  Service: Pulmonary;;   COLONOSCOPY     IR IMAGING GUIDED PORT INSERTION  10/24/2021   left breast surgery Left 1968   VIDEO BRONCHOSCOPY WITH RADIAL ENDOBRONCHIAL ULTRASOUND  09/27/2021   Procedure: RADIAL ENDOBRONCHIAL ULTRASOUND;  Surgeon: Brenna Adine CROME, DO;  Location: [facility]  ENDOSCOPY;  Service: Pulmonary;;     Outpatient Encounter Medications as of 11/04/2024  Medication Sig Note   acetaminophen  (TYLENOL ) 500 MG tablet Take 1 tablet (500 mg total) by mouth every 6 (six) hours as needed for mild pain (pain score 1-3), fever or headache.    amLODipine  (NORVASC ) 10 MG tablet TAKE 1 TABLET  BY MOUTH EVERY DAY    cabozantinib  (CABOMETYX ) 40 MG tablet Take 1 tablet (40 mg total) by mouth daily. Take on an empty stomach, 1 hour before or 2 hours after meals.    calcium -vitamin D (OSCAL WITH D) 500-5 MG-MCG tablet Take 2 tablets by mouth daily with breakfast. (Patient not taking: Reported on 10/21/2024)    diphenoxylate -atropine  (LOMOTIL ) 2.5-0.025 MG tablet Take 2 tablets by mouth 4 (four) times daily as needed for diarrhea or loose stools.    gabapentin  (NEURONTIN ) 100 MG capsule TAKE 2 CAPSULES BY MOUTH 3 TIMES A DAY    levETIRAcetam  (KEPPRA ) 500 MG tablet Take 1 tablet (500 mg total) by mouth 2 (two) times daily.    Multiple Vitamin (MULTIVITAMIN WITH MINERALS) TABS tablet Take 1 tablet by mouth daily.    nivolumab  480 mg in sodium chloride  0.9 % 100 mL Inject 480 mg into the vein every 30 (thirty) days.    oxyCODONE  (OXY IR/ROXICODONE ) 5 MG immediate release tablet Take 1 tablet (5 mg total) by mouth every 6 (six) hours as needed for severe pain (pain score 7-10) or breakthrough pain.    pantoprazole  (PROTONIX ) 40 MG tablet Take 1 tablet (40 mg total) by mouth daily.    predniSONE  (DELTASONE ) 10 MG tablet Take 50mg  daily x 4 weeks, then 40mg  daily x1 week, 30mg  daily x 1 week, then 20mg  daily x 1 week, then 10mg  daily    sulfamethoxazole -trimethoprim  (BACTRIM ) 400-80 MG tablet Take 1 tablet by mouth daily.    [DISCONTINUED] ondansetron  (ZOFRAN ) 8 MG tablet Take 1 tablet (8 mg total) by mouth every 8 (eight) hours as needed for nausea or vomiting. 10/24/2021: Not started   No facility-administered encounter medications on file as of 11/04/2024.     There were no vitals filed for this visit. There is no height or weight on file to calculate BMI.   ECOG PERFORMANCE STATUS: {CHL ONC ECOG [phone]}  PHYSICAL EXAM GENERAL:alert, no distress and comfortable SKIN: no rash  EYES: sclera clear NECK: without mass LYMPH:  no palpable cervical or supraclavicular lymphadenopathy   LUNGS: clear with normal breathing effort HEART: regular rate & rhythm, no lower extremity edema ABDOMEN: abdomen soft, non-tender and normal bowel sounds NEURO: alert & oriented x 3 with fluent speech, no focal motor/sensory deficits Breast exam:  PAC without erythema    CBC    Latest Ref Rng & Units 10/10/2024    9:32 AM 10/07/2024   11:07 AM 09/11/2024   10:03 AM  CBC  WBC 4.0 - 10.5 K/uL 3.8  4.0  3.7   Hemoglobin 13.0 - 17.0 g/dL 87.1  81.7  85.0   Hematocrit 39.0 - 52.0 % 39.8  57.1  45.7   Platelets 150 - 400 K/uL 192  277  172       CMP     Latest Ref Rng & Units 10/12/2024    5:31 AM 10/11/2024    6:00 AM 10/10/2024    9:32 AM  CMP  Glucose 70 - 99 mg/dL 843  837  855   BUN 8 - 23 mg/dL 13  13  11    Creatinine 0.61 -  1.24 mg/dL 9.09  9.03  9.07   Sodium 135 - 145 mmol/L 135  135  137   Potassium 3.5 - 5.1 mmol/L 4.2  4.5  3.3   Chloride 98 - 111 mmol/L 102  102  103   CO2 22 - 32 mmol/L 27  27  30    Calcium  8.9 - 10.3 mg/dL 7.9  8.0  8.0   Total Protein 6.5 - 8.1 g/dL  6.0  5.7   Total Bilirubin 0.0 - 1.2 mg/dL  0.9  0.6   Alkaline Phos 38 - 126 U/L  123  123   AST 15 - 41 U/L  44  44   ALT 0 - 44 U/L  34  33       ASSESSMENT & PLAN:Jorge [name] is a 73 y.o. male with    1. Rectal Adenocarcinoma, [phone] with lung metastasis, MMR normal  -Presented with rectal bleeding and frequent bowel movement.  -staging pelvic MRI 08/27/21 showed: staging at T4b; N2 likely associated with early extra mesorectal lymph node involvement; distance from tumor to internal anal sphincter is 1.2 cm; also with extramural venous involvement. -baseline CEA 571.84 on 09/06/21. -he received concurrent chemoRT with Xeloda  11/1-11/21/22 under the care of Dr. Dewey.  He responded clinically, no recurrent rectal bleeding, his pain has improved  -CEA has decreased on treatment -Lung biopsy 09/27/21 confirmed metastatic rectal cancer.  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     All questions were answered. The patient knows to call the clinic with any problems, questions or concerns. No barriers to learning were detected. I spent *** counseling the patient face to face. The total time spent in the appointment was *** and more than 50% was on counseling, review of test results, and coordination of care.   Vernetta Dizdarevic K Amelia Burgard, NP 11/02/2024 4:53 PM  "

## 2024-11-04 ENCOUNTER — Encounter: Payer: Self-pay | Admitting: Nurse Practitioner

## 2024-11-04 ENCOUNTER — Inpatient Hospital Stay (HOSPITAL_BASED_OUTPATIENT_CLINIC_OR_DEPARTMENT_OTHER): Admitting: Nurse Practitioner

## 2024-11-04 ENCOUNTER — Inpatient Hospital Stay

## 2024-11-04 ENCOUNTER — Ambulatory Visit: Payer: Self-pay

## 2024-11-04 VITALS — BP 131/75 | HR 108 | Temp 97.3°F | Resp 18 | Ht 72.0 in | Wt 146.7 lb

## 2024-11-04 DIAGNOSIS — C20 Malignant neoplasm of rectum: Secondary | ICD-10-CM

## 2024-11-04 DIAGNOSIS — Z515 Encounter for palliative care: Secondary | ICD-10-CM

## 2024-11-04 DIAGNOSIS — G893 Neoplasm related pain (acute) (chronic): Secondary | ICD-10-CM

## 2024-11-04 DIAGNOSIS — G629 Polyneuropathy, unspecified: Secondary | ICD-10-CM | POA: Diagnosis not present

## 2024-11-04 DIAGNOSIS — M792 Neuralgia and neuritis, unspecified: Secondary | ICD-10-CM

## 2024-11-04 DIAGNOSIS — R530 Neoplastic (malignant) related fatigue: Secondary | ICD-10-CM

## 2024-11-04 DIAGNOSIS — Z0000 Encounter for general adult medical examination without abnormal findings: Secondary | ICD-10-CM

## 2024-11-04 DIAGNOSIS — C7800 Secondary malignant neoplasm of unspecified lung: Secondary | ICD-10-CM | POA: Diagnosis not present

## 2024-11-04 LAB — CMP (CANCER CENTER ONLY)
ALT: 22 U/L (ref 0–44)
AST: 50 U/L — ABNORMAL HIGH (ref 15–41)
Albumin: 3 g/dL — ABNORMAL LOW (ref 3.5–5.0)
Alkaline Phosphatase: 176 U/L — ABNORMAL HIGH (ref 38–126)
Anion gap: 10 (ref 5–15)
BUN: 9 mg/dL (ref 8–23)
CO2: 29 mmol/L (ref 22–32)
Calcium: 8.4 mg/dL — ABNORMAL LOW (ref 8.9–10.3)
Chloride: 99 mmol/L (ref 98–111)
Creatinine: 0.92 mg/dL (ref 0.61–1.24)
GFR, Estimated: >60 mL/min
Glucose, Bld: 98 mg/dL (ref 70–99)
Potassium: 3.5 mmol/L (ref 3.5–5.1)
Sodium: 137 mmol/L (ref 135–145)
Total Bilirubin: 0.6 mg/dL (ref 0.0–1.2)
Total Protein: 6.6 g/dL (ref 6.5–8.1)

## 2024-11-04 LAB — CBC WITH DIFFERENTIAL (CANCER CENTER ONLY)
Abs Immature Granulocytes: 0.06 K/uL (ref 0.00–0.07)
Basophils Absolute: 0 K/uL (ref 0.0–0.1)
Basophils Relative: 0 %
Eosinophils Absolute: 0.4 K/uL (ref 0.0–0.5)
Eosinophils Relative: 5 %
HCT: 38.5 % — ABNORMAL LOW (ref 39.0–52.0)
Hemoglobin: 12.6 g/dL — ABNORMAL LOW (ref 13.0–17.0)
Immature Granulocytes: 1 %
Lymphocytes Relative: 21 %
Lymphs Abs: 1.6 K/uL (ref 0.7–4.0)
MCH: 31.3 pg (ref 26.0–34.0)
MCHC: 32.7 g/dL (ref 30.0–36.0)
MCV: 95.5 fL (ref 80.0–100.0)
Monocytes Absolute: 1 K/uL (ref 0.1–1.0)
Monocytes Relative: 13 %
Neutro Abs: 4.6 K/uL (ref 1.7–7.7)
Neutrophils Relative %: 60 %
Platelet Count: 361 K/uL (ref 150–400)
RBC: 4.03 MIL/uL — ABNORMAL LOW (ref 4.22–5.81)
RDW: 15.1 % (ref 11.5–15.5)
WBC Count: 7.7 K/uL (ref 4.0–10.5)
nRBC: 0 % (ref 0.0–0.2)

## 2024-11-04 LAB — CEA (ACCESS): CEA (CHCC): 830.45 ng/mL — ABNORMAL HIGH (ref 0.00–5.00)

## 2024-11-04 NOTE — Progress Notes (Signed)
 "    Palliative Medicine [facility]  Telephone:(336) [phone] Fax:(336) [phone]   Name: Jorge [name] Date: 11/04/2024 MRN: [id number]  DOB: [birth year]  Patient Care Team: Petrina Pries, NP as PCP - General (Family Medicine) Croitoru, Jerel, MD as PCP - Cardiology (Cardiology) Pickenpack-Cousar, Fannie SAILOR, NP as Nurse Practitioner (Nurse Practitioner) Lanny Callander, MD as Consulting Physician (Hematology) Lucky Andrea LABOR, RN as VBCI Care Management   INTERVAL HISTORY: Jorge [name] is a 73 y.o. 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  Palliative ask to see for symptom management and goals of care.   SOCIAL HISTORY:     reports that he quit smoking about 12 months ago. His smoking use included cigarettes. He has a 5 pack-year smoking history. He has never used smokeless tobacco. He reports that he does not currently use alcohol. He reports that he does not use drugs.  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  MEDICATIONS:  Current Outpatient Medications  Medication Sig Dispense Refill   acetaminophen  (TYLENOL ) 500 MG tablet Take 1 tablet (500 mg total) by mouth every 6 (six) hours as needed for mild pain (pain score 1-3), fever or headache.     amLODipine  (NORVASC ) 10 MG tablet TAKE 1 TABLET BY MOUTH EVERY DAY 90 tablet 0   cabozantinib  (CABOMETYX ) 40 MG tablet Take 1 tablet (40 mg total) by mouth daily. Take on an empty stomach, 1 hour before or 2 hours after meals. 30 tablet 2   calcium -vitamin D (OSCAL WITH D) 500-5 MG-MCG tablet Take 2 tablets by mouth daily with breakfast. 180 tablet 0   diphenoxylate -atropine  (LOMOTIL ) 2.5-0.025 MG tablet Take 2 tablets by mouth 4 (four) times daily as needed  for diarrhea or loose stools. 120 tablet 1   gabapentin  (NEURONTIN ) 100 MG capsule TAKE 2 CAPSULES BY MOUTH 3 TIMES A DAY 180 capsule 2   levETIRAcetam  (KEPPRA ) 500 MG tablet Take 1 tablet (500 mg total) by mouth 2 (two) times daily. 180 tablet 0   Multiple Vitamin (MULTIVITAMIN WITH MINERALS) TABS tablet Take 1 tablet by mouth daily.     nivolumab  480 mg in sodium chloride  0.9 % 100 mL Inject 480 mg into the vein every 30 (thirty) days.     oxyCODONE  (OXY IR/ROXICODONE ) 5 MG immediate release tablet Take 1 tablet (5 mg total) by mouth every 6 (six) hours as needed for severe pain (pain score 7-10) or breakthrough pain. 60 tablet 0   pantoprazole  (PROTONIX ) 40 MG tablet Take 1 tablet (40 mg total) by mouth daily. 90 tablet 0   predniSONE  (DELTASONE ) 10 MG tablet Take 50mg  daily x 4 weeks, then 40mg  daily x1 week, 30mg  daily x 1 week, then 20mg  daily x 1 week, then 10mg  daily 225 tablet 0   sulfamethoxazole -trimethoprim  (BACTRIM ) 400-80 MG tablet Take 1 tablet by mouth daily. 90 tablet 0   No current facility-administered medications for this visit.    VITAL SIGNS: There were no vitals taken for this visit. There were no vitals filed for this visit.   Estimated body mass index is 19.9 kg/m as calculated from the following:   Height as of an earlier encounter on 11/04/24: 6' (1.829 m).   Weight as of an earlier  encounter on 11/04/24: 146 lb 11.2 oz (66.5 kg).  Assessment NAD RRR Normal breathing pattern AAO x3  PERFORMANCE STATUS (ECOG) : 1 - Symptomatic but completely ambulatory  IMPRESSION: Discussed the use of AI scribe software for clinical note transcription with the patient, who gave verbal consent to proceed.  History of Present Illness Jorge [name] is a 73 year old male who presents for symptom management follow-up after a recent hospital stay due to falls and confusion. 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 He reports feeling better and back to baseline. Denies unsteady gate and is able to get up, walk, and move around without difficulty. Denies concerns of nausea, vomiting, or constipation.   Appetite fluctuates. Some days better than others. Weight is stable at 146lbs.   He describes a busy family life, with all family members, including his daughters, sons, and grandchildren, visiting during the holidays.  We discussed his pain at length.  Jorge [name] reports pain is well-controlled on current regimen however does have some worsening neuropathic discomfort. His current regimen consist of gabapentin  200 mg 3 times daily and oxycodone  5 mg every 6 hours as needed. Advised to increase gabapentin  to 300 mg 3 times a day.  He verbalized understanding.  Will continue to closely monitor.  All questions answered and support provided.  Assessment & Plan Palliative care management He is managing well post-hospitalization with no current back pain following spinal fluid drainage. He is ambulatory and managing medications effectively, with a minor issue requiring an over-the-counter solution. - Ensure over-the-counter medication is available for minor issues.  Pain and Neuropathy management Symptoms well controlled on current regimen. No adjustments at this time.  -Continue gabapentin  to 300mg  three times daily.   -Oxy IR 5mg  every 4-6 hours as needed for severe pain over the next 7 days. Will send in refill as appropriate.  -I will plan to see patient back in 4-6 weeks in collaboration with his oncology appointments.   Patient expressed understanding and was in agreement with this plan. He also understands that He can call the clinic at any time with any questions, concerns, or complaints.     Any controlled substances utilized were prescribed in the context of palliative care. PDMP has been reviewed.    I personally spent a total of 30 minutes in the care of the patient  today including preparing to see the patient, getting/reviewing separately obtained history, performing a medically appropriate exam/evaluation, counseling and educating, documenting clinical information in the EHR, independently interpreting results, and coordinating care. Visit consisted of counseling and education dealing with the complex and emotionally intense issues of symptom management and palliative care in the setting of serious and potentially life-threatening illness.  Levon Borer, AGPCNP-BC  Palliative Medicine Team/[facility]       "

## 2024-11-04 NOTE — Progress Notes (Signed)
 "  Chief Complaint  Patient presents with   Medicare Wellness     Subjective:   Jorge [name] is a 73 y.o. male who presents for a Medicare Annual Wellness Visit.  Visit info / Clinical Intake: Interpreter Needed?: No  Fall Screening Falls in the past year?: 0 Number of falls in past year: 0 Was there an injury with Fall?: 0 Fall Risk Category Calculator: 0 Patient Fall Risk Level: Low Fall Risk  Fall Risk Patient at Risk for Falls Due to: No Fall Risks Fall risk Follow up: Falls evaluation completed  Advance Directives (For Healthcare) Does Patient Have a Medical Advance Directive?: No Would patient like information on creating a medical advance directive?: No - Patient declined    Allergies (verified) Patient has no known allergies.   Current Medications (verified) Outpatient Encounter Medications as of 11/04/2024  Medication Sig   acetaminophen  (TYLENOL ) 500 MG tablet Take 1 tablet (500 mg total) by mouth every 6 (six) hours as needed for mild pain (pain score 1-3), fever or headache.   amLODipine  (NORVASC ) 10 MG tablet TAKE 1 TABLET BY MOUTH EVERY DAY   cabozantinib  (CABOMETYX ) 40 MG tablet Take 1 tablet (40 mg total) by mouth daily. Take on an empty stomach, 1 hour before or 2 hours after meals.   calcium -vitamin D (OSCAL WITH D) 500-5 MG-MCG tablet Take 2 tablets by mouth daily with breakfast.   diphenoxylate -atropine  (LOMOTIL ) 2.5-0.025 MG tablet Take 2 tablets by mouth 4 (four) times daily as needed for diarrhea or loose stools.   gabapentin  (NEURONTIN ) 100 MG capsule TAKE 2 CAPSULES BY MOUTH 3 TIMES A DAY   levETIRAcetam  (KEPPRA ) 500 MG tablet Take 1 tablet (500 mg total) by mouth 2 (two) times daily.   Multiple Vitamin (MULTIVITAMIN WITH MINERALS) TABS tablet Take 1 tablet by mouth daily.   nivolumab  480 mg in sodium chloride  0.9 % 100 mL Inject 480 mg into the vein every 30 (thirty) days.   oxyCODONE  (OXY IR/ROXICODONE ) 5 MG immediate release tablet Take 1  tablet (5 mg total) by mouth every 6 (six) hours as needed for severe pain (pain score 7-10) or breakthrough pain.   pantoprazole  (PROTONIX ) 40 MG tablet Take 1 tablet (40 mg total) by mouth daily.   predniSONE  (DELTASONE ) 10 MG tablet Take 50mg  daily x 4 weeks, then 40mg  daily x1 week, 30mg  daily x 1 week, then 20mg  daily x 1 week, then 10mg  daily   sulfamethoxazole -trimethoprim  (BACTRIM ) 400-80 MG tablet Take 1 tablet by mouth daily.   [DISCONTINUED] ondansetron  (ZOFRAN ) 8 MG tablet Take 1 tablet (8 mg total) by mouth every 8 (eight) hours as needed for nausea or vomiting.   No facility-administered encounter medications on file as of 11/04/2024.    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   Past Surgical History:  Procedure Laterality Date   BRONCHIAL BIOPSY  09/27/2021   Procedure: BRONCHIAL BIOPSIES;  Surgeon: Brenna Adine CROME, DO;  Location: [facility];  Service: Pulmonary;;   BRONCHIAL BRUSHINGS  09/27/2021   Procedure: BRONCHIAL BRUSHINGS;  Surgeon: Brenna Adine CROME, DO;  Location: [facility];  Service: Pulmonary;;   BRONCHIAL NEEDLE ASPIRATION BIOPSY  09/27/2021   Procedure: BRONCHIAL NEEDLE ASPIRATION BIOPSIES;  Surgeon: Brenna Adine CROME, DO;  Location: [facility];  Service: Pulmonary;;   COLONOSCOPY     IR IMAGING GUIDED PORT INSERTION  10/24/2021   left breast surgery Left 1968  VIDEO BRONCHOSCOPY WITH RADIAL ENDOBRONCHIAL ULTRASOUND  09/27/2021   Procedure: RADIAL ENDOBRONCHIAL ULTRASOUND;  Surgeon: Brenna Adine CROME, DO;  Location: [facility];  Service: Pulmonary;;   Family History  Problem Relation Age of Onset   Cancer Mother 8       unknown type cancer   Colon cancer Neg Hx    Esophageal cancer Neg Hx    Rectal cancer Neg Hx    Stomach cancer Neg Hx    Inflammatory bowel disease Neg Hx    Liver disease Neg Hx    Pancreatic cancer Neg Hx    Social History    Occupational History   Not on file  Tobacco Use   Smoking status: Former    Current packs/day: 0.00    Average packs/day: 0.3 packs/day for 20.0 years (5.0 ttl pk-yrs)    Types: Cigarettes    Quit date: 10/2023    Years since quitting: 1.0   Smokeless tobacco: Never  Vaping Use   Vaping status: Never Used  Substance and Sexual Activity   Alcohol use: Not Currently    Comment: 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, stopped in 02/2021   Drug use: Never   Sexual activity: Not on file   Tobacco Counseling Counseling given: Not Answered  SDOH Screenings   Food Insecurity: No Food Insecurity (10/07/2024)  Housing: Low Risk (10/07/2024)  Transportation Needs: No Transportation Needs (10/15/2024)  Utilities: Not At Risk (10/07/2024)  Depression (PHQ2-9): Low Risk (11/04/2024)  Physical Activity: Sufficiently Active (01/18/2024)  Social Connections: Socially Isolated (10/07/2024)  Tobacco Use: Medium Risk (11/04/2024)   See flowsheets for full screening details  Depression Screen PHQ 2 & 9 Depression Scale- Over the past 2 weeks, how often have you been bothered by any of the following problems? Little interest or pleasure in doing things: 0 Feeling down, depressed, or hopeless (PHQ Adolescent also includes...irritable): 0 PHQ-2 Total Score: 0 Trouble falling or staying asleep, or sleeping too much: 0 Feeling tired or having little energy: 0 Poor appetite or overeating (PHQ Adolescent also includes...weight loss): 0 Feeling bad about yourself - or that you are a failure or have let yourself or your family down: 0 Trouble concentrating on things, such as reading the newspaper or watching television (PHQ Adolescent also includes...like school work): 0 Moving or speaking so slowly that other people could have noticed. Or the opposite - being so fidgety or restless that you have been moving around a lot more than usual: 0 Thoughts that you would be better off dead, or of hurting  yourself in some way: 0 PHQ-9 Total Score: 0 If you checked off any problems, how difficult have these problems made it for you to do your work, take care of things at home, or get along with other people?: Not difficult at all     Goals Addressed   None          Objective:    There were no vitals filed for this visit. There is no height or weight on file to calculate BMI.  Hearing/Vision screen No results found. Immunizations and Health Maintenance Health Maintenance  Topic Date Due   COVID-19 Vaccine (1) Never done   Hepatitis C Screening  Never done   DTaP/Tdap/Td (1 - Tdap) Never done   Pneumococcal Vaccine: 50+ Years (1 of 2 - PCV) Never done   Zoster Vaccines- Shingrix (1 of 2) Never done   Colonoscopy  08/19/2022   Influenza Vaccine  Never done  Meningococcal B Vaccine  Aged Out        Assessment/Plan:  This is a routine wellness examination for Kriss.  Patient Care Team: Petrina Pries, NP as PCP - General (Family Medicine) Croitoru, Jerel, MD as PCP - Cardiology (Cardiology) Pickenpack-Cousar, Fannie SAILOR, NP as Nurse Practitioner (Nurse Practitioner) Lanny Callander, MD as Consulting Physician (Hematology) Lucky Andrea LABOR, RN as [facility] Care Management  I have personally reviewed and noted the following in the patients chart:   Medical and social history Use of alcohol, tobacco or illicit drugs  Current medications and supplements including opioid prescriptions. Functional ability and status Nutritional status Physical activity Advanced directives List of other physicians Hospitalizations, surgeries, and ER visits in previous 12 months Vitals Screenings to include cognitive, depression, and falls Referrals and appointments  No orders of the defined types were placed in this encounter.  In addition, I have reviewed and discussed with patient certain preventive protocols, quality metrics, and best practice recommendations. A written personalized care plan for  preventive services as well as general preventive health recommendations were provided to patient.   Shona List   11/04/2024   Return in 1 year (on 11/04/2025).  After Visit Summary: (Declined) Due to this being a telephonic visit, with patients personalized plan was offered to patient but patient Declined AVS at this time   Nurse Notes:  "

## 2024-11-04 NOTE — Patient Instructions (Signed)
 Mr. Saenz,  Thank you for taking the time for your Medicare Wellness Visit. I appreciate your continued commitment to your health goals. Please review the care plan we discussed, and feel free to reach out if I can assist you further.  Please note that Annual Wellness Visits do not include a physical exam. Some assessments may be limited, especially if the visit was conducted virtually. If needed, we may recommend an in-person follow-up with your provider.  Ongoing Care Seeing your primary care provider every 3 to 6 months helps us  monitor your health and provide consistent, personalized care.   Referrals If a referral was made during today's visit and you haven't received any updates within two weeks, please contact the referred provider directly to check on the status.  Recommended Screenings:  Health Maintenance  Topic Date Due   COVID-19 Vaccine (1) Never done   Hepatitis C Screening  Never done   DTaP/Tdap/Td vaccine (1 - Tdap) Never done   Pneumococcal Vaccine for age over 62 (1 of 2 - PCV) Never done   Zoster (Shingles) Vaccine (1 of 2) Never done   Colon Cancer Screening  08/19/2022   Flu Shot  Never done   Meningitis B Vaccine  Aged Out       11/04/2024    9:37 AM  Advanced Directives  Does Patient Have a Medical Advance Directive? No    Vision: Annual vision screenings are recommended for early detection of glaucoma, cataracts, and diabetic retinopathy. These exams can also reveal signs of chronic conditions such as diabetes and high blood pressure.  Dental: Annual dental screenings help detect early signs of oral cancer, gum disease, and other conditions linked to overall health, including heart disease and diabetes.  Please see the attached documents for additional preventive care recommendations.

## 2024-11-05 ENCOUNTER — Telehealth: Payer: Self-pay | Admitting: *Deleted

## 2024-11-05 ENCOUNTER — Encounter: Payer: Self-pay | Admitting: Hematology

## 2024-11-05 ENCOUNTER — Other Ambulatory Visit: Payer: Self-pay | Admitting: Neurology

## 2024-11-05 ENCOUNTER — Encounter: Payer: Self-pay | Admitting: *Deleted

## 2024-11-05 DIAGNOSIS — R569 Unspecified convulsions: Secondary | ICD-10-CM

## 2024-11-07 ENCOUNTER — Other Ambulatory Visit: Payer: Self-pay

## 2024-11-10 ENCOUNTER — Ambulatory Visit: Admitting: Diagnostic Neuroimaging

## 2024-11-10 ENCOUNTER — Other Ambulatory Visit (HOSPITAL_COMMUNITY): Payer: Self-pay

## 2024-11-10 ENCOUNTER — Ambulatory Visit: Payer: Self-pay | Admitting: Nurse Practitioner

## 2024-11-10 ENCOUNTER — Encounter: Payer: Self-pay | Admitting: Diagnostic Neuroimaging

## 2024-11-10 ENCOUNTER — Encounter: Payer: Self-pay | Admitting: *Deleted

## 2024-11-10 NOTE — Progress Notes (Signed)
 Specialty Pharmacy Refill Coordination Note  JERAL ZICK is a 74 y.o. male contacted today regarding refills of specialty medication(s) Cabozantinib  S-Malate (CABOMETYX )   Patient requested Delivery   Delivery date: 11/25/24   Verified address: [address]   [place] [id number]   Medication will be filled on: 11/24/24

## 2024-11-11 ENCOUNTER — Encounter: Payer: Self-pay | Admitting: *Deleted

## 2024-11-11 ENCOUNTER — Other Ambulatory Visit: Payer: Self-pay | Admitting: Nurse Practitioner

## 2024-11-11 DIAGNOSIS — C20 Malignant neoplasm of rectum: Secondary | ICD-10-CM

## 2024-11-12 ENCOUNTER — Other Ambulatory Visit: Payer: Self-pay

## 2024-11-17 ENCOUNTER — Other Ambulatory Visit: Payer: Self-pay

## 2024-11-17 DIAGNOSIS — C7800 Secondary malignant neoplasm of unspecified lung: Secondary | ICD-10-CM

## 2024-11-17 DIAGNOSIS — Z515 Encounter for palliative care: Secondary | ICD-10-CM

## 2024-11-17 DIAGNOSIS — G893 Neoplasm related pain (acute) (chronic): Secondary | ICD-10-CM

## 2024-11-17 DIAGNOSIS — R197 Diarrhea, unspecified: Secondary | ICD-10-CM

## 2024-11-17 MED ORDER — OXYCODONE HCL 5 MG PO TABS: 5.0000 mg | ORAL_TABLET | Freq: Four times a day (QID) | ORAL | 0 refills | Status: DC | PRN

## 2024-11-17 MED ORDER — DIPHENOXYLATE-ATROPINE 2.5-0.025 MG PO TABS: 2.0000 | ORAL_TABLET | Freq: Four times a day (QID) | ORAL | 1 refills | Status: AC | PRN

## 2024-11-21 ENCOUNTER — Encounter: Payer: Self-pay | Admitting: Hematology

## 2024-11-24 ENCOUNTER — Other Ambulatory Visit: Payer: Self-pay

## 2024-11-27 ENCOUNTER — Ambulatory Visit (HOSPITAL_COMMUNITY)

## 2024-12-01 ENCOUNTER — Other Ambulatory Visit: Payer: Self-pay | Admitting: Nurse Practitioner

## 2024-12-01 DIAGNOSIS — Z515 Encounter for palliative care: Secondary | ICD-10-CM

## 2024-12-01 DIAGNOSIS — C20 Malignant neoplasm of rectum: Secondary | ICD-10-CM

## 2024-12-01 DIAGNOSIS — G893 Neoplasm related pain (acute) (chronic): Secondary | ICD-10-CM

## 2024-12-01 MED ORDER — OXYCODONE HCL 5 MG PO TABS: 5.0000 mg | ORAL_TABLET | Freq: Four times a day (QID) | ORAL | 0 refills | Status: AC | PRN

## 2024-12-02 ENCOUNTER — Other Ambulatory Visit: Payer: Self-pay | Admitting: Nurse Practitioner

## 2024-12-02 ENCOUNTER — Other Ambulatory Visit: Payer: Self-pay | Admitting: Hematology

## 2024-12-02 DIAGNOSIS — C20 Malignant neoplasm of rectum: Secondary | ICD-10-CM

## 2024-12-02 NOTE — Progress Notes (Unsigned)
 " Patient Care Team: Petrina Pries, NP as PCP - General (Family Medicine) Croitoru, Jerel, MD as PCP - Cardiology (Cardiology) Pickenpack-Cousar, Fannie SAILOR, NP as Nurse Practitioner (Nurse Practitioner) Lanny Callander, MD as Consulting Physician (Hematology)  Clinic Day:  12/02/2024  Referring physician: Lanny Callander, MD  ASSESSMENT & PLAN:   Assessment & Plan: Rectal cancer [facility]) [phone] with lung metastasis, MMR normal, KRAS(+) [id number]  -diagnosed 08/2021 by colonoscopy for rectal bleeding and frequent BM. Staging MRI showed early extra mesorectal lymph node involvement.  -baseline CEA 571.84 on 09/06/21.  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 Xeloda  dose reduced due to elevated liver enzymes.  -due to new liver lesions, we switched to second-line FOLFIRI, with continued beva, on 02/21/22. He tolerates well overall.  - restaging CT scan on April 22 and June 26 showed stable disease overall. -CT 10/09/2023 showed mild disease progression in lungs, his tumor marker CEA has been going up also.  -I changed his chemo to lonsurf  and beva, he started on 12/13/2023, unfortunately progressed on CT 02/25/2024 -treatment changed to Stivarga . 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  He is to be followed by neurology.    The patient understands the plans discussed today and is in agreement with them.  He knows to contact our office if he develops concerns prior to his next appointment.  I provided *** minutes of face-to-face time during this encounter and > 50% was spent counseling as documented under my assessment and plan.    [name], NP  [facility] - A DEPT OF [facility] [address] [place] [address] Dept: [phone] Dept Fax: [phone]   No orders of the defined types were placed in this encounter.     CHIEF COMPLAINT:  CC: Rectal cancer  Current Treatment: Cabometyx  and nivolumab   INTERVAL HISTORY:  Jorge [name] is here today for repeat clinical assessment.  He was last seen by Lacie, NP, on 11/04/2024.CT CAP has been ordered but not yet scheduled.  He denies fevers or chills. He denies pain. His appetite is good. His weight {Weight change:10426}.  I have reviewed the past medical history, past surgical history, social history and family history with the patient and they are unchanged from previous note.  ALLERGIES:  has no known allergies.  MEDICATIONS:  Current Outpatient Medications  Medication Sig Dispense Refill   acetaminophen  (TYLENOL ) 500 MG tablet Take 1 tablet (500 mg total) by mouth every 6 (six) hours as needed for mild pain (pain score 1-3), fever or headache.     amLODipine  (NORVASC ) 10 MG tablet TAKE 1 TABLET BY MOUTH EVERY DAY 90 tablet 0   cabozantinib  (CABOMETYX ) 40 MG tablet Take 1 tablet (40 mg total) by mouth daily. Take on an empty stomach, 1 hour before or 2 hours after meals. 30 tablet 2   calcium -vitamin D (OSCAL WITH D) 500-5 MG-MCG tablet Take 2 tablets by mouth daily with breakfast. 180 tablet 0   diphenoxylate -atropine  (LOMOTIL ) 2.5-0.025 MG tablet Take 2 tablets by mouth 4 (four) times daily as needed for diarrhea or loose stools. 120 tablet 1   gabapentin  (NEURONTIN ) 100 MG  capsule TAKE 2 CAPSULES BY MOUTH 3 TIMES A DAY 180 capsule 2   levETIRAcetam  (KEPPRA ) 500 MG tablet Take 1 tablet (500 mg total) by mouth 2 (two) times daily. 180 tablet 0   Multiple Vitamin (MULTIVITAMIN WITH MINERALS) TABS tablet Take 1 tablet by mouth daily.     nivolumab  480 mg in sodium chloride  0.9 % 100 mL Inject 480 mg into the vein every  30 (thirty) days.     oxyCODONE  (OXY IR/ROXICODONE ) 5 MG immediate release tablet Take 1 tablet (5 mg total) by mouth every 6 (six) hours as needed for severe pain (pain score 7-10) or breakthrough pain. 60 tablet 0   pantoprazole  (PROTONIX ) 40 MG tablet Take 1 tablet (40 mg total) by mouth daily. 90 tablet 0   predniSONE  (DELTASONE ) 10 MG tablet Take 50mg  daily x 4 weeks, then 40mg  daily x1 week, 30mg  daily x 1 week, then 20mg  daily x 1 week, then 10mg  daily 225 tablet 0   sulfamethoxazole -trimethoprim  (BACTRIM ) 400-80 MG tablet Take 1 tablet by mouth daily. 90 tablet 0   No current facility-administered medications for this visit.    HISTORY OF PRESENT ILLNESS:   Oncology History Overview Note  Cancer Staging Rectal cancer [facility]) Staging form: Colon and Rectum, AJCC 8th Edition - Clinical stage from 08/29/2021: Jorge [name], cM1 - Signed by Lanny Callander, MD on 08/29/2021    Rectal cancer (HCC)  07/23/2021 Imaging   CT AP  IMPRESSION: 1. Appearance of the rectum likely indicates a rectal mass suspicious for rectal carcinoma. Inflammatory process would be less likely. Direct visualization is suggested. 2. Cholelithiasis without evidence of acute cholecystitis. 3. Aortic atherosclerosis.   08/19/2021 Procedure   Colonoscopy, under Dr. Wilhelmenia  Impression: - Rectal tenderness, palpable rectal mass and hemorrhoids found on digital rectal exam. - Stool in the entire examined colon - lavaged with still inadequate clearance. - One 35 mm polyp at the hepatic flexure. Biopsied. Tattooed distal in case future endoscopic resection is considered - however, has larger issues with rectal mass currently as below. - Four, 5 to 11 mm polyps in the transverse colon and at the hepatic flexure, removed with a cold snare. Resected and retrieved. - Diverticulosis in the recto-sigmoid colon and in the sigmoid colon. - Rule out malignancy, partially obstructing tumor in the rectum and from 6 to 13 cm  proximal to the anus. Biopsied.   08/19/2021 Pathology Results   Diagnosis 1. Hepatic Flexure Biopsy - TUBULAR ADENOMA WITHOUT HIGH-GRADE DYSPLASIA OR MALIGNANCY 2. Transverse Colon Biopsy, and hepatic flexure, polyps (4) - TUBULAR ADENOMA WITHOUT HIGH-GRADE DYSPLASIA OR MALIGNANCY - OTHER FRAGMENTS OF POLYPOID COLONIC MUCOSA WITH NO SPECIFIC HISTOPATHOLOGIC CHANGES - FOOD MATERIAL 3. Rectum, biopsy - ADENOCARCINOMA. SEE NOTE   08/22/2021 Initial Diagnosis   Rectal cancer (HCC)   08/26/2021 Imaging   IMPRESSION: 1. A 2.2 x 1.9 cm right middle lobe pulmonary nodule as well as a total of four right upper lobe pulmonary nodule and masses measuring up to 3.6 cm. Findings concerning for metastatic primary lung cancer versus less likely metastases in a patient with rectal cancer. Additional imaging evaluation or consultation with Pulmonology or Thoracic Surgery recommended. 2. No gross hilar adenopathy, noting limited sensitivity for the detection of hilar adenopathy on this noncontrast study. 3. Cholelithiasis. 4.  Emphysema (ICD10-J43.9). 5. At least left anterior descending coronary artery calcifications.   08/27/2021 Imaging   IMPRESSION: Rectal adenocarcinoma T stage: T4 B   Rectal adenocarcinoma N stage: N2 disease likely associated with  early extra mesorectal lymph node involvement.   Distance from tumor to the internal anal sphincter is 1.2 cm.   Also with extramural venous involvement as described.   08/29/2021 Cancer Staging   Staging form: Colon and Rectum, AJCC 8th Edition - Clinical stage from 08/29/2021: Jorge [name], cM1 - Signed by Lanny Callander, MD on 08/29/2021 Stage prefix: Initial diagnosis   01/23/2022 Imaging   CT CAP w contrast IMPRESSION: 1. Mild interval decrease in size of multiple right-sided pulmonary nodules. No new suspicious pulmonary nodule or mass. 2. Interval development of approximately 10 new ill-defined hypoattenuating liver lesions ranging in size  from approximately 5-10 mm. Imaging features suspicious for metastatic disease. MRI abdomen with and without contrast may prove helpful to further evaluate. 3. Similar appearance of wall thickening in the rectum with perirectal edema. 4. Cholelithiasis. 5. Prostatomegaly. 6. Aortic Atherosclerosis (ICD10-I70.0).   11/21/2022 Imaging    IMPRESSION: 1. Stable exam. No new or progressive interval findings. 2. The primary rectosigmoid lesion described previously is not well seen on the current study. There is presacral and perirectal edema, likely treatment related. 3. No substantial change in appearance of bilateral pulmonary metastases. 4. Stable tiny hypodensities in both hepatic lobes. These were previously characterized as metastatic disease. No new liver lesions on today's exam. 5. Cholelithiasis. 6. Emphysema (ICD10-J43.9) and Aortic Atherosclerosis (ICD10-170.0)     02/25/2024 Imaging   CT Chest, Abdomen, and Pelvis with contrast  IMPRESSION: Multiple lung nodules are again identified. These are increased in size from previous.   Previous nodes however in the mediastinum and right lung hilum are improving.   Fatty infiltration with multiple low-attenuation liver lesions. Many these are slightly larger but more low in density today. Dilated gallbladder with stones.   Persistent wall thickening and nodularity along the rectum with adjacent stranding and fluid. Please correlate with exact location of neoplasm.   Overall mixed response.       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)   10/14/2021 - 01/25/2022 Chemotherapy   Patient is on Treatment Plan : COLORECTAL CapeOx + Bevacizumab  q21d     02/21/2022 - 06/29/2022 Chemotherapy   Patient is on Treatment Plan : COLORECTAL FOLFIRI / BEVACIZUMAB  Q14D     02/21/2022 - 11/16/2023 Chemotherapy   Patient is on Treatment Plan : COLORECTAL FOLFIRI +  Bevacizumab  q14d     12/13/2023 - 01/31/2024 Chemotherapy   Patient is on Treatment Plan : COLORECTAL Bevacizumab  + Trifluridine /Tipiracil  q28d     02/25/2024 Imaging   CT CAP with contrast  IMPRESSION: Multiple lung nodules are again identified. These are increased in size from previous.  Previous nodes however in the mediastinum and right lung hilum are improving.   Fatty infiltration with multiple low-attenuation liver lesions. Many these are slightly larger but more low in density today.   Dilated gallbladder with stones. Persistent wall thickening and nodularity along the rectum with adjacent stranding and fluid. Please correlate with exact location of neoplasm.   Overall mixed response.       07/15/2024 -  Chemotherapy   Patient is on Treatment Plan : ANAL Nivolumab  (480) q28d         REVIEW OF SYSTEMS:   Constitutional: Denies fevers, chills or abnormal weight loss Eyes: Denies blurriness of vision Ears, nose, mouth, throat, and face: Denies mucositis or sore throat Respiratory: Denies cough, dyspnea or wheezes Cardiovascular: Denies palpitation, chest discomfort or lower extremity swelling Gastrointestinal:  Denies nausea, heartburn or change in bowel habits Skin: Denies abnormal skin rashes Lymphatics: Denies new lymphadenopathy or easy bruising Neurological:Denies numbness, tingling or new weaknesses Behavioral/Psych: Mood is stable, no new changes  All other systems were reviewed with the patient and are negative.   VITALS:  There were no vitals taken for this visit.  Wt Readings from Last 3 Encounters:  11/04/24 146 lb 11.2 oz (66.5 kg)  10/11/24 136 lb 11 oz (62 kg)  09/11/24 135 lb 8 oz (61.5 kg)    There is no height or weight on file to calculate BMI.  Performance status (ECOG): {CHL ONC [id number]  PHYSICAL EXAM:   GENERAL:alert, no distress and comfortable SKIN: skin color, texture, turgor are normal, no rashes or significant lesions EYES: normal,  Conjunctiva are pink and non-injected, sclera clear OROPHARYNX:no exudate, no erythema and lips, buccal mucosa, and tongue normal  NECK: supple, thyroid  normal size, non-tender, without nodularity LYMPH:  no palpable lymphadenopathy in the cervical, axillary or inguinal LUNGS: clear to auscultation and percussion with normal breathing effort HEART: regular rate & rhythm and no murmurs and no lower extremity edema ABDOMEN:abdomen soft, non-tender and normal bowel sounds Musculoskeletal:no cyanosis of digits and no clubbing  NEURO: alert & oriented x 3 with fluent speech, no focal motor/sensory deficits  LABORATORY DATA:  I have reviewed the data as listed    Component Value Date/Time   NA 137 11/04/2024 0920   K 3.5 11/04/2024 0920   CL 99 11/04/2024 0920   CO2 29 11/04/2024 0920   GLUCOSE 98 11/04/2024 0920   BUN 9 11/04/2024 0920   CREATININE 0.92 11/04/2024 0920   CALCIUM  8.4 (L) 11/04/2024 0920   PROT 6.6 11/04/2024 0920   ALBUMIN 3.0 (L) 11/04/2024 0920   AST 50 (H) 11/04/2024 0920   ALT 22 11/04/2024 0920   ALKPHOS 176 (H) 11/04/2024 0920   BILITOT 0.6 11/04/2024 0920   GFRNONAA >60 11/04/2024 0920    No results found for: SPEP, UPEP  Lab Results  Component Value Date   WBC 7.7 11/04/2024   NEUTROABS 4.6 11/04/2024   HGB 12.6 (L) 11/04/2024   HCT 38.5 (L) 11/04/2024   MCV 95.5 11/04/2024   PLT 361 11/04/2024      Chemistry      Component Value Date/Time   NA 137 11/04/2024 0920   K 3.5 11/04/2024 0920   CL 99 11/04/2024 0920   CO2 29 11/04/2024 0920   BUN 9 11/04/2024 0920   CREATININE 0.92 11/04/2024 0920      Component Value Date/Time   CALCIUM  8.4 (L) 11/04/2024 0920   ALKPHOS 176 (H) 11/04/2024 0920   AST 50 (H) 11/04/2024 0920   ALT 22 11/04/2024 0920   BILITOT 0.6 11/04/2024 0920       RADIOGRAPHIC STUDIES: I have personally reviewed the radiological images as listed and agreed with the findings in the report. No results found. "

## 2024-12-02 NOTE — Assessment & Plan Note (Addendum)
 [id number] with lung metastasis, MMR normal, KRAS(+) [id number]  -diagnosed 08/2021 by colonoscopy for rectal bleeding and frequent BM. Staging MRI showed early extra mesorectal lymph node involvement.  -baseline CEA 571.84 on 09/06/21.  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 Xeloda  dose reduced due to elevated liver enzymes.  -due to new liver lesions, we switched to second-line FOLFIRI, with continued beva, on 02/21/22. He tolerates well overall.  - restaging CT scan on April 22 and June 26 showed stable disease overall. -CT 10/09/2023 showed mild disease progression in lungs, his tumor marker CEA has been going up also.  -I changed his chemo to lonsurf  and beva, he started on 12/13/2023, unfortunately progressed on CT 02/25/2024 -treatment changed to Stivarga . 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  He is to be followed by neurology.

## 2024-12-04 ENCOUNTER — Inpatient Hospital Stay: Admitting: Nurse Practitioner

## 2024-12-04 ENCOUNTER — Inpatient Hospital Stay

## 2024-12-04 ENCOUNTER — Inpatient Hospital Stay: Attending: Physician Assistant

## 2024-12-04 DIAGNOSIS — C20 Malignant neoplasm of rectum: Secondary | ICD-10-CM

## 2024-12-09 ENCOUNTER — Encounter: Payer: Self-pay | Admitting: Hematology

## 2024-12-12 ENCOUNTER — Other Ambulatory Visit: Payer: Self-pay | Admitting: Hematology

## 2024-12-15 ENCOUNTER — Ambulatory Visit (HOSPITAL_COMMUNITY)

## 2024-12-18 ENCOUNTER — Inpatient Hospital Stay: Admitting: Hematology

## 2024-12-18 ENCOUNTER — Inpatient Hospital Stay

## 2024-12-18 ENCOUNTER — Inpatient Hospital Stay: Attending: Physician Assistant
